# Patient Record
Sex: Female | Born: 1955
Health system: Southern US, Community
[De-identification: ages and names within clinical notes are randomized; demographics above are authoritative.]

## PROBLEM LIST (undated history)

## (undated) DIAGNOSIS — Z1211 Encounter for screening for malignant neoplasm of colon: Secondary | ICD-10-CM

## (undated) DIAGNOSIS — R109 Unspecified abdominal pain: Secondary | ICD-10-CM

## (undated) DIAGNOSIS — T4145XA Adverse effect of unspecified anesthetic, initial encounter: Secondary | ICD-10-CM

## (undated) DIAGNOSIS — K219 Gastro-esophageal reflux disease without esophagitis: Secondary | ICD-10-CM

## (undated) DIAGNOSIS — T7840XA Allergy, unspecified, initial encounter: Secondary | ICD-10-CM

## (undated) DIAGNOSIS — C50919 Malignant neoplasm of unspecified site of unspecified female breast: Secondary | ICD-10-CM

## (undated) DIAGNOSIS — F329 Major depressive disorder, single episode, unspecified: Secondary | ICD-10-CM

## (undated) DIAGNOSIS — M2550 Pain in unspecified joint: Secondary | ICD-10-CM

## (undated) DIAGNOSIS — Z9221 Personal history of antineoplastic chemotherapy: Secondary | ICD-10-CM

## (undated) DIAGNOSIS — R0600 Dyspnea, unspecified: Secondary | ICD-10-CM

## (undated) DIAGNOSIS — G510 Bell's palsy: Secondary | ICD-10-CM

## (undated) DIAGNOSIS — Z923 Personal history of irradiation: Secondary | ICD-10-CM

## (undated) DIAGNOSIS — I2510 Atherosclerotic heart disease of native coronary artery without angina pectoris: Secondary | ICD-10-CM

## (undated) DIAGNOSIS — F32A Depression, unspecified: Secondary | ICD-10-CM

## (undated) DIAGNOSIS — E785 Hyperlipidemia, unspecified: Secondary | ICD-10-CM

## (undated) DIAGNOSIS — R079 Chest pain, unspecified: Secondary | ICD-10-CM

## (undated) DIAGNOSIS — Z853 Personal history of malignant neoplasm of breast: Secondary | ICD-10-CM

## (undated) DIAGNOSIS — T8859XA Other complications of anesthesia, initial encounter: Secondary | ICD-10-CM

## (undated) DIAGNOSIS — K760 Fatty (change of) liver, not elsewhere classified: Secondary | ICD-10-CM

## (undated) DIAGNOSIS — H269 Unspecified cataract: Secondary | ICD-10-CM

## (undated) DIAGNOSIS — K8020 Calculus of gallbladder without cholecystitis without obstruction: Secondary | ICD-10-CM

## (undated) DIAGNOSIS — I10 Essential (primary) hypertension: Secondary | ICD-10-CM

## (undated) HISTORY — DX: Unspecified abdominal pain: R10.9

## (undated) HISTORY — DX: Personal history of malignant neoplasm of breast: Z85.3

## (undated) HISTORY — DX: Allergy, unspecified, initial encounter: T78.40XA

## (undated) HISTORY — DX: Chest pain, unspecified: R07.9

## (undated) HISTORY — DX: Pain in unspecified joint: M25.50

## (undated) HISTORY — DX: Bell's palsy: G51.0

## (undated) HISTORY — DX: Hyperlipidemia, unspecified: E78.5

## (undated) HISTORY — PX: TUBAL LIGATION: SHX77

## (undated) HISTORY — DX: Unspecified cataract: H26.9

## (undated) HISTORY — DX: Encounter for screening for malignant neoplasm of colon: Z12.11

## (undated) HISTORY — DX: Malignant neoplasm of unspecified site of unspecified female breast: C50.919

## (undated) HISTORY — DX: Essential (primary) hypertension: I10

## (undated) HISTORY — DX: Calculus of gallbladder without cholecystitis without obstruction: K80.20

## (undated) HISTORY — PX: OTHER SURGICAL HISTORY: SHX169

## (undated) HISTORY — DX: Gastro-esophageal reflux disease without esophagitis: K21.9

## (undated) HISTORY — DX: Fatty (change of) liver, not elsewhere classified: K76.0

---

## 2004-11-21 DIAGNOSIS — I639 Cerebral infarction, unspecified: Secondary | ICD-10-CM

## 2004-11-21 DIAGNOSIS — G510 Bell's palsy: Secondary | ICD-10-CM

## 2004-11-21 HISTORY — DX: Cerebral infarction, unspecified: I63.9

## 2004-11-21 HISTORY — DX: Bell's palsy: G51.0

## 2005-12-30 ENCOUNTER — Emergency Department (HOSPITAL_COMMUNITY): Admission: EM | Admit: 2005-12-30 | Discharge: 2005-12-30 | Payer: Self-pay | Admitting: Emergency Medicine

## 2006-01-04 ENCOUNTER — Emergency Department (HOSPITAL_COMMUNITY): Admission: EM | Admit: 2006-01-04 | Discharge: 2006-01-04 | Payer: Self-pay | Admitting: Family Medicine

## 2006-01-12 ENCOUNTER — Encounter: Admission: RE | Admit: 2006-01-12 | Discharge: 2006-04-12 | Payer: Self-pay | Admitting: Emergency Medicine

## 2006-03-07 ENCOUNTER — Ambulatory Visit: Payer: Self-pay | Admitting: *Deleted

## 2006-03-07 ENCOUNTER — Ambulatory Visit: Payer: Self-pay | Admitting: Family Medicine

## 2006-04-26 ENCOUNTER — Ambulatory Visit: Payer: Self-pay | Admitting: Family Medicine

## 2006-06-19 ENCOUNTER — Ambulatory Visit: Payer: Self-pay | Admitting: Family Medicine

## 2006-06-22 ENCOUNTER — Ambulatory Visit (HOSPITAL_COMMUNITY): Admission: RE | Admit: 2006-06-22 | Discharge: 2006-06-22 | Payer: Self-pay | Admitting: Internal Medicine

## 2006-07-08 ENCOUNTER — Ambulatory Visit (HOSPITAL_COMMUNITY): Admission: RE | Admit: 2006-07-08 | Discharge: 2006-07-08 | Payer: Self-pay | Admitting: Internal Medicine

## 2006-08-28 ENCOUNTER — Ambulatory Visit: Payer: Self-pay | Admitting: Family Medicine

## 2006-11-22 ENCOUNTER — Emergency Department (HOSPITAL_COMMUNITY): Admission: EM | Admit: 2006-11-22 | Discharge: 2006-11-22 | Payer: Self-pay | Admitting: Emergency Medicine

## 2006-12-20 ENCOUNTER — Ambulatory Visit: Payer: Self-pay | Admitting: Family Medicine

## 2007-01-10 ENCOUNTER — Ambulatory Visit: Payer: Self-pay | Admitting: Family Medicine

## 2007-06-26 ENCOUNTER — Ambulatory Visit: Payer: Self-pay | Admitting: Family Medicine

## 2007-07-19 ENCOUNTER — Ambulatory Visit: Payer: Self-pay | Admitting: Internal Medicine

## 2007-07-31 ENCOUNTER — Ambulatory Visit: Payer: Self-pay | Admitting: Family Medicine

## 2007-08-01 ENCOUNTER — Ambulatory Visit (HOSPITAL_COMMUNITY): Admission: RE | Admit: 2007-08-01 | Discharge: 2007-08-01 | Payer: Self-pay | Admitting: Family Medicine

## 2007-08-08 ENCOUNTER — Ambulatory Visit: Payer: Self-pay | Admitting: Internal Medicine

## 2007-08-08 ENCOUNTER — Encounter (INDEPENDENT_AMBULATORY_CARE_PROVIDER_SITE_OTHER): Payer: Self-pay | Admitting: *Deleted

## 2007-08-14 ENCOUNTER — Encounter: Payer: Self-pay | Admitting: Internal Medicine

## 2007-08-14 ENCOUNTER — Ambulatory Visit: Payer: Self-pay | Admitting: Internal Medicine

## 2007-08-21 ENCOUNTER — Ambulatory Visit: Payer: Self-pay | Admitting: Family Medicine

## 2007-08-27 ENCOUNTER — Ambulatory Visit (HOSPITAL_COMMUNITY): Admission: RE | Admit: 2007-08-27 | Discharge: 2007-08-27 | Payer: Self-pay | Admitting: Family Medicine

## 2007-09-11 ENCOUNTER — Ambulatory Visit: Payer: Self-pay | Admitting: Family Medicine

## 2007-10-26 ENCOUNTER — Other Ambulatory Visit: Admission: RE | Admit: 2007-10-26 | Discharge: 2007-10-26 | Payer: Self-pay | Admitting: Obstetrics & Gynecology

## 2007-10-26 ENCOUNTER — Ambulatory Visit: Payer: Self-pay | Admitting: Obstetrics & Gynecology

## 2007-10-26 ENCOUNTER — Ambulatory Visit: Payer: Self-pay | Admitting: Family Medicine

## 2007-11-13 ENCOUNTER — Ambulatory Visit: Payer: Self-pay | Admitting: Internal Medicine

## 2007-11-22 DIAGNOSIS — Z9221 Personal history of antineoplastic chemotherapy: Secondary | ICD-10-CM

## 2007-11-22 DIAGNOSIS — Z923 Personal history of irradiation: Secondary | ICD-10-CM

## 2007-11-22 DIAGNOSIS — C50919 Malignant neoplasm of unspecified site of unspecified female breast: Secondary | ICD-10-CM

## 2007-11-22 HISTORY — PX: BREAST LUMPECTOMY: SHX2

## 2007-11-22 HISTORY — DX: Personal history of antineoplastic chemotherapy: Z92.21

## 2007-11-22 HISTORY — DX: Personal history of irradiation: Z92.3

## 2007-11-22 HISTORY — DX: Malignant neoplasm of unspecified site of unspecified female breast: C50.919

## 2007-12-03 ENCOUNTER — Ambulatory Visit: Payer: Self-pay | Admitting: Family Medicine

## 2007-12-03 LAB — CONVERTED CEMR LAB
Basophils Absolute: 0 10*3/uL (ref 0.0–0.1)
Basophils Relative: 0 % (ref 0–1)
Eosinophils Absolute: 0.2 10*3/uL (ref 0.0–0.7)
Eosinophils Relative: 3 % (ref 0–5)
HCT: 42.9 % (ref 36.0–46.0)
Hemoglobin: 14.7 g/dL (ref 12.0–15.0)
MCHC: 34.3 g/dL (ref 30.0–36.0)
MCV: 85.3 fL (ref 78.0–100.0)
Monocytes Absolute: 0.5 10*3/uL (ref 0.1–1.0)
RDW: 13 % (ref 11.5–15.5)

## 2008-02-15 ENCOUNTER — Ambulatory Visit: Payer: Self-pay | Admitting: Family Medicine

## 2008-04-29 ENCOUNTER — Ambulatory Visit: Payer: Self-pay | Admitting: Internal Medicine

## 2008-06-03 ENCOUNTER — Ambulatory Visit: Payer: Self-pay | Admitting: Internal Medicine

## 2008-09-26 ENCOUNTER — Encounter (INDEPENDENT_AMBULATORY_CARE_PROVIDER_SITE_OTHER): Payer: Self-pay | Admitting: Family Medicine

## 2008-09-26 ENCOUNTER — Ambulatory Visit: Payer: Self-pay | Admitting: Family Medicine

## 2008-09-26 LAB — CONVERTED CEMR LAB
BUN: 7 mg/dL (ref 6–23)
CO2: 28 meq/L (ref 19–32)
Cholesterol: 155 mg/dL (ref 0–200)
Creatinine, Ser: 0.63 mg/dL (ref 0.40–1.20)
Glucose, Bld: 152 mg/dL — ABNORMAL HIGH (ref 70–99)
Sodium: 142 meq/L (ref 135–145)
Total Bilirubin: 0.7 mg/dL (ref 0.3–1.2)
Total Protein: 7.5 g/dL (ref 6.0–8.3)
Triglycerides: 199 mg/dL — ABNORMAL HIGH (ref <150)
VLDL: 40 mg/dL (ref 0–40)

## 2008-09-27 ENCOUNTER — Encounter (INDEPENDENT_AMBULATORY_CARE_PROVIDER_SITE_OTHER): Payer: Self-pay | Admitting: Family Medicine

## 2008-11-06 ENCOUNTER — Encounter: Admission: RE | Admit: 2008-11-06 | Discharge: 2008-11-06 | Payer: Self-pay | Admitting: Family Medicine

## 2008-11-20 ENCOUNTER — Emergency Department (HOSPITAL_COMMUNITY): Admission: EM | Admit: 2008-11-20 | Discharge: 2008-11-20 | Payer: Self-pay | Admitting: Emergency Medicine

## 2008-12-05 ENCOUNTER — Ambulatory Visit: Payer: Self-pay | Admitting: *Deleted

## 2008-12-11 ENCOUNTER — Ambulatory Visit: Payer: Self-pay | Admitting: Internal Medicine

## 2008-12-11 LAB — CONVERTED CEMR LAB
BUN: 10 mg/dL
CO2: 22 meq/L
Calcium: 9.7 mg/dL
Chloride: 102 meq/L
Creatinine, Ser: 0.6 mg/dL
Glucose, Bld: 225 mg/dL — ABNORMAL HIGH
Potassium: 3.9 meq/L
Sodium: 139 meq/L
Total CK: 23 U/L

## 2008-12-16 ENCOUNTER — Encounter (INDEPENDENT_AMBULATORY_CARE_PROVIDER_SITE_OTHER): Payer: Self-pay | Admitting: Diagnostic Radiology

## 2008-12-16 ENCOUNTER — Encounter: Admission: RE | Admit: 2008-12-16 | Discharge: 2008-12-16 | Payer: Self-pay | Admitting: Family Medicine

## 2008-12-19 ENCOUNTER — Ambulatory Visit: Payer: Self-pay | Admitting: Oncology

## 2008-12-24 ENCOUNTER — Encounter: Admission: RE | Admit: 2008-12-24 | Discharge: 2008-12-24 | Payer: Self-pay | Admitting: Family Medicine

## 2008-12-25 LAB — CBC WITH DIFFERENTIAL/PLATELET
BASO%: 0.4 % (ref 0.0–2.0)
LYMPH%: 29.5 % (ref 14.0–48.0)
MCHC: 34.5 g/dL (ref 32.0–36.0)
MONO#: 0.6 10*3/uL (ref 0.1–0.9)
Platelets: 275 10*3/uL (ref 145–400)
RBC: 4.52 10*6/uL (ref 3.70–5.32)
WBC: 8 10*3/uL (ref 3.9–10.0)
lymph#: 2.4 10*3/uL (ref 0.9–3.3)

## 2008-12-25 LAB — COMPREHENSIVE METABOLIC PANEL
AST: 27 U/L (ref 0–37)
Albumin: 4.2 g/dL (ref 3.5–5.2)
Alkaline Phosphatase: 114 U/L (ref 39–117)
Glucose, Bld: 218 mg/dL — ABNORMAL HIGH (ref 70–99)
Potassium: 4 mEq/L (ref 3.5–5.3)
Sodium: 136 mEq/L (ref 135–145)
Total Protein: 7.6 g/dL (ref 6.0–8.3)

## 2009-01-01 ENCOUNTER — Ambulatory Visit (HOSPITAL_COMMUNITY): Admission: RE | Admit: 2009-01-01 | Discharge: 2009-01-01 | Payer: Self-pay | Admitting: General Surgery

## 2009-01-11 ENCOUNTER — Inpatient Hospital Stay (HOSPITAL_COMMUNITY): Admission: EM | Admit: 2009-01-11 | Discharge: 2009-01-19 | Payer: Self-pay | Admitting: Emergency Medicine

## 2009-01-12 ENCOUNTER — Ambulatory Visit: Payer: Self-pay | Admitting: Oncology

## 2009-01-26 LAB — CBC WITH DIFFERENTIAL/PLATELET
BASO%: 0.2 % (ref 0.0–2.0)
Basophils Absolute: 0 10*3/uL (ref 0.0–0.1)
EOS%: 0 % (ref 0.0–7.0)
HGB: 11.5 g/dL — ABNORMAL LOW (ref 11.6–15.9)
MCH: 28.5 pg (ref 25.1–34.0)
MCHC: 34.1 g/dL (ref 31.5–36.0)
MCV: 83.4 fL (ref 79.5–101.0)
MONO%: 2.3 % (ref 0.0–14.0)
RBC: 4.04 10*6/uL (ref 3.70–5.45)
RDW: 15.2 % — ABNORMAL HIGH (ref 11.2–14.5)
lymph#: 0.6 10*3/uL — ABNORMAL LOW (ref 0.9–3.3)
nRBC: 0 % (ref 0–0)

## 2009-01-26 LAB — COMPREHENSIVE METABOLIC PANEL
AST: 37 U/L (ref 0–37)
BUN: 17 mg/dL (ref 6–23)
Calcium: 8.6 mg/dL (ref 8.4–10.5)
Chloride: 102 mEq/L (ref 96–112)
Creatinine, Ser: 0.77 mg/dL (ref 0.40–1.20)
Total Bilirubin: 0.3 mg/dL (ref 0.3–1.2)

## 2009-01-26 LAB — WHOLE BLOOD GLUCOSE: Glucose: 569 mg/dL (ref 70–100)

## 2009-01-27 ENCOUNTER — Ambulatory Visit: Payer: Self-pay | Admitting: Family Medicine

## 2009-02-04 ENCOUNTER — Ambulatory Visit: Payer: Self-pay | Admitting: Oncology

## 2009-02-16 LAB — COMPREHENSIVE METABOLIC PANEL
ALT: 19 U/L (ref 0–35)
Albumin: 3.7 g/dL (ref 3.5–5.2)
CO2: 25 mEq/L (ref 19–32)
Calcium: 9.1 mg/dL (ref 8.4–10.5)
Chloride: 103 mEq/L (ref 96–112)
Potassium: 3.4 mEq/L — ABNORMAL LOW (ref 3.5–5.3)
Sodium: 138 mEq/L (ref 135–145)
Total Protein: 6.4 g/dL (ref 6.0–8.3)

## 2009-02-16 LAB — CBC WITH DIFFERENTIAL/PLATELET
Eosinophils Absolute: 0.2 10*3/uL (ref 0.0–0.5)
HGB: 11.5 g/dL — ABNORMAL LOW (ref 11.6–15.9)
MONO#: 0.9 10*3/uL (ref 0.1–0.9)
NEUT#: 4.5 10*3/uL (ref 1.5–6.5)
Platelets: 243 10*3/uL (ref 145–400)
RBC: 3.99 10*6/uL (ref 3.70–5.45)
RDW: 15.7 % — ABNORMAL HIGH (ref 11.2–14.5)
WBC: 7.1 10*3/uL (ref 3.9–10.3)
nRBC: 0 % (ref 0–0)

## 2009-03-09 LAB — CBC WITH DIFFERENTIAL/PLATELET
Basophils Absolute: 0.1 10*3/uL (ref 0.0–0.1)
EOS%: 2.2 % (ref 0.0–7.0)
Eosinophils Absolute: 0.1 10*3/uL (ref 0.0–0.5)
HGB: 11 g/dL — ABNORMAL LOW (ref 11.6–15.9)
MCH: 29.2 pg (ref 25.1–34.0)
NEUT#: 3.8 10*3/uL (ref 1.5–6.5)
RBC: 3.77 10*6/uL (ref 3.70–5.45)
RDW: 15.9 % — ABNORMAL HIGH (ref 11.2–14.5)
lymph#: 1.5 10*3/uL (ref 0.9–3.3)

## 2009-03-09 LAB — COMPREHENSIVE METABOLIC PANEL
AST: 21 U/L (ref 0–37)
Albumin: 3.5 g/dL (ref 3.5–5.2)
BUN: 7 mg/dL (ref 6–23)
Calcium: 9 mg/dL (ref 8.4–10.5)
Chloride: 104 mEq/L (ref 96–112)
Potassium: 3.5 mEq/L (ref 3.5–5.3)
Sodium: 140 mEq/L (ref 135–145)
Total Protein: 6.3 g/dL (ref 6.0–8.3)

## 2009-03-26 ENCOUNTER — Ambulatory Visit: Payer: Self-pay | Admitting: Oncology

## 2009-03-30 ENCOUNTER — Ambulatory Visit (HOSPITAL_COMMUNITY): Admission: RE | Admit: 2009-03-30 | Discharge: 2009-03-30 | Payer: Self-pay | Admitting: Oncology

## 2009-03-30 LAB — CBC WITH DIFFERENTIAL/PLATELET
Basophils Absolute: 0 10*3/uL (ref 0.0–0.1)
Eosinophils Absolute: 0 10*3/uL (ref 0.0–0.5)
HGB: 11 g/dL — ABNORMAL LOW (ref 11.6–15.9)
MCV: 89.2 fL (ref 79.5–101.0)
MONO#: 0.8 10*3/uL (ref 0.1–0.9)
NEUT#: 4.5 10*3/uL (ref 1.5–6.5)
RDW: 15.8 % — ABNORMAL HIGH (ref 11.2–14.5)
lymph#: 1.6 10*3/uL (ref 0.9–3.3)

## 2009-03-30 LAB — COMPREHENSIVE METABOLIC PANEL
Albumin: 3.6 g/dL (ref 3.5–5.2)
BUN: 9 mg/dL (ref 6–23)
CO2: 24 mEq/L (ref 19–32)
Calcium: 9.2 mg/dL (ref 8.4–10.5)
Chloride: 104 mEq/L (ref 96–112)
Glucose, Bld: 174 mg/dL — ABNORMAL HIGH (ref 70–99)
Potassium: 4 mEq/L (ref 3.5–5.3)

## 2009-04-02 ENCOUNTER — Ambulatory Visit (HOSPITAL_COMMUNITY): Admission: RE | Admit: 2009-04-02 | Discharge: 2009-04-02 | Payer: Self-pay | Admitting: Oncology

## 2009-04-21 ENCOUNTER — Ambulatory Visit (HOSPITAL_BASED_OUTPATIENT_CLINIC_OR_DEPARTMENT_OTHER): Admission: RE | Admit: 2009-04-21 | Discharge: 2009-04-21 | Payer: Self-pay | Admitting: General Surgery

## 2009-04-21 ENCOUNTER — Encounter (INDEPENDENT_AMBULATORY_CARE_PROVIDER_SITE_OTHER): Payer: Self-pay | Admitting: General Surgery

## 2009-04-21 ENCOUNTER — Encounter: Admission: RE | Admit: 2009-04-21 | Discharge: 2009-04-21 | Payer: Self-pay | Admitting: General Surgery

## 2009-04-30 LAB — CBC WITH DIFFERENTIAL/PLATELET
BASO%: 0.4 % (ref 0.0–2.0)
Basophils Absolute: 0 10*3/uL (ref 0.0–0.1)
EOS%: 5.4 % (ref 0.0–7.0)
Eosinophils Absolute: 0.4 10*3/uL (ref 0.0–0.5)
HCT: 35.8 % (ref 34.8–46.6)
HGB: 12.1 g/dL (ref 11.6–15.9)
LYMPH%: 27.8 % (ref 14.0–49.7)
MCH: 28.6 pg (ref 25.1–34.0)
MCHC: 33.8 g/dL (ref 31.5–36.0)
MCV: 84.6 fL (ref 79.5–101.0)
MONO#: 0.8 10*3/uL (ref 0.1–0.9)
MONO%: 11.9 % (ref 0.0–14.0)
NEUT#: 3.8 10*3/uL (ref 1.5–6.5)
NEUT%: 54.5 % (ref 38.4–76.8)
Platelets: 235 10*3/uL (ref 145–400)
RBC: 4.23 10*6/uL (ref 3.70–5.45)
RDW: 13.2 % (ref 11.2–14.5)
WBC: 6.9 10*3/uL (ref 3.9–10.3)
lymph#: 1.9 10*3/uL (ref 0.9–3.3)

## 2009-04-30 LAB — COMPREHENSIVE METABOLIC PANEL
Albumin: 3.7 g/dL (ref 3.5–5.2)
Alkaline Phosphatase: 82 U/L (ref 39–117)
BUN: 8 mg/dL (ref 6–23)
Calcium: 9.3 mg/dL (ref 8.4–10.5)
Glucose, Bld: 173 mg/dL — ABNORMAL HIGH (ref 70–99)
Potassium: 4.1 mEq/L (ref 3.5–5.3)

## 2009-05-01 ENCOUNTER — Ambulatory Visit: Admission: RE | Admit: 2009-05-01 | Discharge: 2009-07-08 | Payer: Self-pay | Admitting: Radiation Oncology

## 2009-05-27 ENCOUNTER — Ambulatory Visit (HOSPITAL_COMMUNITY): Admission: RE | Admit: 2009-05-27 | Discharge: 2009-05-27 | Payer: Self-pay | Admitting: General Surgery

## 2009-06-12 ENCOUNTER — Ambulatory Visit (HOSPITAL_COMMUNITY): Admission: RE | Admit: 2009-06-12 | Discharge: 2009-06-12 | Payer: Self-pay | Admitting: Radiation Oncology

## 2009-06-29 ENCOUNTER — Ambulatory Visit: Payer: Self-pay | Admitting: Oncology

## 2009-07-01 LAB — COMPREHENSIVE METABOLIC PANEL
Alkaline Phosphatase: 95 U/L (ref 39–117)
CO2: 26 mEq/L (ref 19–32)
Creatinine, Ser: 0.68 mg/dL (ref 0.40–1.20)
Glucose, Bld: 165 mg/dL — ABNORMAL HIGH (ref 70–99)
Total Bilirubin: 0.4 mg/dL (ref 0.3–1.2)

## 2009-07-01 LAB — CBC WITH DIFFERENTIAL/PLATELET
Eosinophils Absolute: 0.2 10*3/uL (ref 0.0–0.5)
HCT: 38.8 % (ref 34.8–46.6)
LYMPH%: 21.7 % (ref 14.0–49.7)
MCHC: 34.5 g/dL (ref 31.5–36.0)
MCV: 81.8 fL (ref 79.5–101.0)
MONO#: 0.6 10*3/uL (ref 0.1–0.9)
MONO%: 12.3 % (ref 0.0–14.0)
NEUT#: 2.9 10*3/uL (ref 1.5–6.5)
NEUT%: 61 % (ref 38.4–76.8)
Platelets: 174 10*3/uL (ref 145–400)
RBC: 4.75 10*6/uL (ref 3.70–5.45)
WBC: 4.8 10*3/uL (ref 3.9–10.3)

## 2009-08-20 ENCOUNTER — Ambulatory Visit: Payer: Self-pay | Admitting: Family Medicine

## 2009-09-29 ENCOUNTER — Ambulatory Visit: Payer: Self-pay | Admitting: Oncology

## 2009-10-02 LAB — CBC WITH DIFFERENTIAL/PLATELET
BASO%: 0.4 % (ref 0.0–2.0)
EOS%: 3.9 % (ref 0.0–7.0)
HCT: 36.9 % (ref 34.8–46.6)
LYMPH%: 29.1 % (ref 14.0–49.7)
MCH: 30.6 pg (ref 25.1–34.0)
MCHC: 34.6 g/dL (ref 31.5–36.0)
MCV: 88.6 fL (ref 79.5–101.0)
MONO%: 8.6 % (ref 0.0–14.0)
NEUT%: 58 % (ref 38.4–76.8)
Platelets: 152 10*3/uL (ref 145–400)
RBC: 4.17 10*6/uL (ref 3.70–5.45)
WBC: 4.6 10*3/uL (ref 3.9–10.3)

## 2009-10-02 LAB — COMPREHENSIVE METABOLIC PANEL
ALT: 22 U/L (ref 0–35)
Alkaline Phosphatase: 77 U/L (ref 39–117)
CO2: 25 mEq/L (ref 19–32)
Creatinine, Ser: 0.76 mg/dL (ref 0.40–1.20)
Sodium: 139 mEq/L (ref 135–145)
Total Bilirubin: 0.6 mg/dL (ref 0.3–1.2)
Total Protein: 6.6 g/dL (ref 6.0–8.3)

## 2009-11-09 ENCOUNTER — Encounter: Admission: RE | Admit: 2009-11-09 | Discharge: 2009-11-09 | Payer: Self-pay | Admitting: Oncology

## 2010-01-05 ENCOUNTER — Ambulatory Visit: Payer: Self-pay | Admitting: Oncology

## 2010-01-07 ENCOUNTER — Encounter: Admission: RE | Admit: 2010-01-07 | Discharge: 2010-01-07 | Payer: Self-pay | Admitting: General Surgery

## 2010-01-07 ENCOUNTER — Ambulatory Visit (HOSPITAL_COMMUNITY): Admission: RE | Admit: 2010-01-07 | Discharge: 2010-01-07 | Payer: Self-pay | Admitting: Oncology

## 2010-01-07 LAB — COMPREHENSIVE METABOLIC PANEL
AST: 50 U/L — ABNORMAL HIGH (ref 0–37)
Albumin: 4.1 g/dL (ref 3.5–5.2)
BUN: 9 mg/dL (ref 6–23)
CO2: 21 mEq/L (ref 19–32)
Calcium: 8.5 mg/dL (ref 8.4–10.5)
Chloride: 106 mEq/L (ref 96–112)
Creatinine, Ser: 0.73 mg/dL (ref 0.40–1.20)
Glucose, Bld: 229 mg/dL — ABNORMAL HIGH (ref 70–99)
Potassium: 3.7 mEq/L (ref 3.5–5.3)

## 2010-01-07 LAB — CBC WITH DIFFERENTIAL/PLATELET
Basophils Absolute: 0 10*3/uL (ref 0.0–0.1)
Eosinophils Absolute: 0.3 10*3/uL (ref 0.0–0.5)
HCT: 38.8 % (ref 34.8–46.6)
HGB: 13.4 g/dL (ref 11.6–15.9)
MONO#: 0.5 10*3/uL (ref 0.1–0.9)
NEUT#: 3.1 10*3/uL (ref 1.5–6.5)
NEUT%: 55.6 % (ref 38.4–76.8)
RDW: 12 % (ref 11.2–14.5)
lymph#: 1.7 10*3/uL (ref 0.9–3.3)

## 2010-04-06 ENCOUNTER — Ambulatory Visit: Payer: Self-pay | Admitting: Oncology

## 2010-04-07 LAB — CBC WITH DIFFERENTIAL/PLATELET
Basophils Absolute: 0 10*3/uL (ref 0.0–0.1)
EOS%: 4.6 % (ref 0.0–7.0)
Eosinophils Absolute: 0.2 10*3/uL (ref 0.0–0.5)
HCT: 40.7 % (ref 34.8–46.6)
HGB: 14.3 g/dL (ref 11.6–15.9)
MCH: 31.2 pg (ref 25.1–34.0)
MCV: 88.8 fL (ref 79.5–101.0)
MONO%: 8.1 % (ref 0.0–14.0)
NEUT#: 2.9 10*3/uL (ref 1.5–6.5)
NEUT%: 54.5 % (ref 38.4–76.8)
RDW: 12.3 % (ref 11.2–14.5)
lymph#: 1.7 10*3/uL (ref 0.9–3.3)

## 2010-04-07 LAB — COMPREHENSIVE METABOLIC PANEL
AST: 45 U/L — ABNORMAL HIGH (ref 0–37)
Albumin: 3.8 g/dL (ref 3.5–5.2)
BUN: 9 mg/dL (ref 6–23)
Calcium: 8.8 mg/dL (ref 8.4–10.5)
Chloride: 104 mEq/L (ref 96–112)
Creatinine, Ser: 0.61 mg/dL (ref 0.40–1.20)
Glucose, Bld: 174 mg/dL — ABNORMAL HIGH (ref 70–99)
Potassium: 3.7 mEq/L (ref 3.5–5.3)

## 2010-07-07 ENCOUNTER — Ambulatory Visit: Payer: Self-pay | Admitting: Oncology

## 2010-07-08 LAB — CBC WITH DIFFERENTIAL/PLATELET
Basophils Absolute: 0 10*3/uL (ref 0.0–0.1)
EOS%: 5 % (ref 0.0–7.0)
Eosinophils Absolute: 0.2 10*3/uL (ref 0.0–0.5)
HCT: 35.2 % (ref 34.8–46.6)
HGB: 12.3 g/dL (ref 11.6–15.9)
LYMPH%: 23.9 % (ref 14.0–49.7)
MCH: 31.4 pg (ref 25.1–34.0)
MCV: 90.1 fL (ref 79.5–101.0)
MONO%: 8.7 % (ref 0.0–14.0)
NEUT#: 3 10*3/uL (ref 1.5–6.5)
NEUT%: 62 % (ref 38.4–76.8)
Platelets: 160 10*3/uL (ref 145–400)
RDW: 12.6 % (ref 11.2–14.5)

## 2010-07-08 LAB — COMPREHENSIVE METABOLIC PANEL
AST: 56 U/L — ABNORMAL HIGH (ref 0–37)
Albumin: 3.7 g/dL (ref 3.5–5.2)
Alkaline Phosphatase: 93 U/L (ref 39–117)
BUN: 9 mg/dL (ref 6–23)
Creatinine, Ser: 0.68 mg/dL (ref 0.40–1.20)
Glucose, Bld: 287 mg/dL — ABNORMAL HIGH (ref 70–99)
Total Bilirubin: 0.8 mg/dL (ref 0.3–1.2)

## 2010-08-16 ENCOUNTER — Encounter: Admission: RE | Admit: 2010-08-16 | Discharge: 2010-08-16 | Payer: Self-pay | Admitting: Oncology

## 2010-08-31 ENCOUNTER — Ambulatory Visit (HOSPITAL_COMMUNITY): Admission: RE | Admit: 2010-08-31 | Discharge: 2010-08-31 | Payer: Self-pay | Admitting: Oncology

## 2010-08-31 ENCOUNTER — Ambulatory Visit (HOSPITAL_BASED_OUTPATIENT_CLINIC_OR_DEPARTMENT_OTHER): Payer: Self-pay | Admitting: Oncology

## 2010-08-31 LAB — CBC WITH DIFFERENTIAL/PLATELET
Basophils Absolute: 0 10*3/uL (ref 0.0–0.1)
Eosinophils Absolute: 0.2 10*3/uL (ref 0.0–0.5)
HCT: 34.4 % — ABNORMAL LOW (ref 34.8–46.6)
HGB: 12 g/dL (ref 11.6–15.9)
MCH: 31.5 pg (ref 25.1–34.0)
MONO#: 0.4 10*3/uL (ref 0.1–0.9)
NEUT%: 59.2 % (ref 38.4–76.8)
WBC: 4.6 10*3/uL (ref 3.9–10.3)
lymph#: 1.3 10*3/uL (ref 0.9–3.3)

## 2010-08-31 LAB — COMPREHENSIVE METABOLIC PANEL
BUN: 14 mg/dL (ref 6–23)
CO2: 28 mEq/L (ref 19–32)
Calcium: 9 mg/dL (ref 8.4–10.5)
Chloride: 106 mEq/L (ref 96–112)
Creatinine, Ser: 0.79 mg/dL (ref 0.40–1.20)

## 2010-08-31 LAB — LACTATE DEHYDROGENASE: LDH: 102 U/L (ref 94–250)

## 2010-12-12 ENCOUNTER — Encounter: Payer: Self-pay | Admitting: Internal Medicine

## 2010-12-12 ENCOUNTER — Encounter: Payer: Self-pay | Admitting: Family Medicine

## 2010-12-12 ENCOUNTER — Encounter: Payer: Self-pay | Admitting: Oncology

## 2010-12-13 ENCOUNTER — Encounter: Payer: Self-pay | Admitting: Oncology

## 2010-12-23 ENCOUNTER — Encounter (HOSPITAL_BASED_OUTPATIENT_CLINIC_OR_DEPARTMENT_OTHER): Payer: Self-pay | Admitting: Oncology

## 2010-12-23 DIAGNOSIS — Z170 Estrogen receptor positive status [ER+]: Secondary | ICD-10-CM

## 2010-12-23 DIAGNOSIS — C50219 Malignant neoplasm of upper-inner quadrant of unspecified female breast: Secondary | ICD-10-CM

## 2010-12-23 LAB — CBC WITH DIFFERENTIAL/PLATELET
Basophils Absolute: 0 10*3/uL (ref 0.0–0.1)
EOS%: 3 % (ref 0.0–7.0)
Eosinophils Absolute: 0.2 10*3/uL (ref 0.0–0.5)
HGB: 11.8 g/dL (ref 11.6–15.9)
MCH: 31.4 pg (ref 25.1–34.0)
NEUT#: 3.2 10*3/uL (ref 1.5–6.5)
RBC: 3.77 10*6/uL (ref 3.70–5.45)
RDW: 12.1 % (ref 11.2–14.5)
lymph#: 1.5 10*3/uL (ref 0.9–3.3)

## 2010-12-23 LAB — LUTEINIZING HORMONE: LH: 9.8 m[IU]/mL

## 2010-12-23 LAB — COMPREHENSIVE METABOLIC PANEL
AST: 35 U/L (ref 0–37)
Albumin: 4.1 g/dL (ref 3.5–5.2)
BUN: 14 mg/dL (ref 6–23)
Calcium: 8.9 mg/dL (ref 8.4–10.5)
Chloride: 103 mEq/L (ref 96–112)
Potassium: 3.8 mEq/L (ref 3.5–5.3)
Sodium: 141 mEq/L (ref 135–145)
Total Protein: 6.8 g/dL (ref 6.0–8.3)

## 2010-12-30 LAB — ESTRADIOL, ULTRA SENS: Estradiol, Ultra Sensitive: 10 pg/mL

## 2011-02-27 LAB — GLUCOSE, CAPILLARY
Glucose-Capillary: 183 mg/dL — ABNORMAL HIGH (ref 70–99)
Glucose-Capillary: 185 mg/dL — ABNORMAL HIGH (ref 70–99)

## 2011-02-27 LAB — BASIC METABOLIC PANEL
BUN: 8 mg/dL (ref 6–23)
GFR calc Af Amer: >60 mL/min (ref >60)
GFR calc non Af Amer: >60 mL/min (ref >60)
Potassium: 3.9 mEq/L (ref 3.5–5.1)
Sodium: 140 mEq/L (ref 135–145)

## 2011-02-27 LAB — CBC
HCT: 39.6 % (ref 36.0–46.0)
Platelets: 205 10*3/uL (ref 150–400)
RBC: 4.7 MIL/uL (ref 3.87–5.11)
WBC: 5.2 10*3/uL (ref 4.0–10.5)

## 2011-02-28 LAB — GLUCOSE, CAPILLARY: Glucose-Capillary: 130 mg/dL — ABNORMAL HIGH (ref 70–99)

## 2011-03-01 LAB — CBC
MCHC: 33.3 g/dL (ref 30.0–36.0)
MCV: 88 fL (ref 78.0–100.0)
RBC: 3.96 MIL/uL (ref 3.87–5.11)

## 2011-03-01 LAB — DIFFERENTIAL
Lymphocytes Relative: 31 % (ref 12–46)
Lymphs Abs: 1.5 10*3/uL (ref 0.7–4.0)
Neutrophils Relative %: 52 % (ref 43–77)

## 2011-03-01 LAB — URINALYSIS, ROUTINE W REFLEX MICROSCOPIC
Nitrite: NEGATIVE
Specific Gravity, Urine: 1.009 (ref 1.005–1.030)
pH: 5.5 (ref 5.0–8.0)

## 2011-03-01 LAB — LACTATE DEHYDROGENASE: LDH: 153 U/L (ref 94–250)

## 2011-03-01 LAB — URINE MICROSCOPIC-ADD ON

## 2011-03-01 LAB — COMPREHENSIVE METABOLIC PANEL
AST: 37 U/L (ref 0–37)
CO2: 25 mEq/L (ref 19–32)
Calcium: 9 mg/dL (ref 8.4–10.5)
Creatinine, Ser: 0.63 mg/dL (ref 0.4–1.2)
GFR calc Af Amer: >60 mL/min (ref >60)
GFR calc non Af Amer: >60 mL/min (ref >60)

## 2011-03-03 LAB — GLUCOSE, CAPILLARY: Glucose-Capillary: 149 mg/dL — ABNORMAL HIGH (ref 70–99)

## 2011-03-03 LAB — BASIC METABOLIC PANEL
BUN: 3 mg/dL — ABNORMAL LOW (ref 6–23)
BUN: 3 mg/dL — ABNORMAL LOW (ref 6–23)
Calcium: 8.5 mg/dL (ref 8.4–10.5)
Chloride: 104 mEq/L (ref 96–112)
Creatinine, Ser: 0.46 mg/dL (ref 0.4–1.2)
GFR calc non Af Amer: >60 mL/min (ref >60)
GFR calc non Af Amer: >60 mL/min (ref >60)
Glucose, Bld: 135 mg/dL — ABNORMAL HIGH (ref 70–99)
Glucose, Bld: 138 mg/dL — ABNORMAL HIGH (ref 70–99)

## 2011-03-03 LAB — CBC
MCV: 87.4 fL (ref 78.0–100.0)
Platelets: 170 10*3/uL (ref 150–400)
RDW: 14.1 % (ref 11.5–15.5)
WBC: 8.8 10*3/uL (ref 4.0–10.5)

## 2011-03-08 LAB — CBC
HCT: 31.3 % — ABNORMAL LOW (ref 36.0–46.0)
HCT: 31.8 % — ABNORMAL LOW (ref 36.0–46.0)
HCT: 36.8 % (ref 36.0–46.0)
MCHC: 34.7 g/dL (ref 30.0–36.0)
MCHC: 33.4 g/dL (ref 30.0–36.0)
MCHC: 33.8 g/dL (ref 30.0–36.0)
MCHC: 34.2 g/dL (ref 30.0–36.0)
MCV: 85.1 fL (ref 78.0–100.0)
MCV: 85.9 fL (ref 78.0–100.0)
MCV: 86.1 fL (ref 78.0–100.0)
MCV: 86.7 fL (ref 78.0–100.0)
MCV: 87.4 fL (ref 78.0–100.0)
MCV: 87.7 fL (ref 78.0–100.0)
Platelets: 213 10*3/uL (ref 150–400)
Platelets: 145 10*3/uL — ABNORMAL LOW (ref 150–400)
Platelets: 145 10*3/uL — ABNORMAL LOW (ref 150–400)
Platelets: 157 10*3/uL (ref 150–400)
Platelets: 164 10*3/uL (ref 150–400)
Platelets: 170 10*3/uL (ref 150–400)
RBC: 4.39 MIL/uL (ref 3.87–5.11)
RBC: 3.45 MIL/uL — ABNORMAL LOW (ref 3.87–5.11)
RDW: 13 % (ref 11.5–15.5)
RDW: 13.3 % (ref 11.5–15.5)
RDW: 13.9 % (ref 11.5–15.5)
WBC: 19.5 10*3/uL — ABNORMAL HIGH (ref 4.0–10.5)
WBC: 21.4 10*3/uL — ABNORMAL HIGH (ref 4.0–10.5)
WBC: 9 10*3/uL (ref 4.0–10.5)

## 2011-03-08 LAB — GLUCOSE, CAPILLARY
Glucose-Capillary: 143 mg/dL — ABNORMAL HIGH (ref 70–99)
Glucose-Capillary: 122 mg/dL — ABNORMAL HIGH (ref 70–99)
Glucose-Capillary: 124 mg/dL — ABNORMAL HIGH (ref 70–99)
Glucose-Capillary: 127 mg/dL — ABNORMAL HIGH (ref 70–99)
Glucose-Capillary: 128 mg/dL — ABNORMAL HIGH (ref 70–99)
Glucose-Capillary: 130 mg/dL — ABNORMAL HIGH (ref 70–99)
Glucose-Capillary: 135 mg/dL — ABNORMAL HIGH (ref 70–99)
Glucose-Capillary: 143 mg/dL — ABNORMAL HIGH (ref 70–99)
Glucose-Capillary: 152 mg/dL — ABNORMAL HIGH (ref 70–99)
Glucose-Capillary: 158 mg/dL — ABNORMAL HIGH (ref 70–99)
Glucose-Capillary: 158 mg/dL — ABNORMAL HIGH (ref 70–99)
Glucose-Capillary: 159 mg/dL — ABNORMAL HIGH (ref 70–99)
Glucose-Capillary: 160 mg/dL — ABNORMAL HIGH (ref 70–99)
Glucose-Capillary: 163 mg/dL — ABNORMAL HIGH (ref 70–99)
Glucose-Capillary: 163 mg/dL — ABNORMAL HIGH (ref 70–99)
Glucose-Capillary: 204 mg/dL — ABNORMAL HIGH (ref 70–99)
Glucose-Capillary: 228 mg/dL — ABNORMAL HIGH (ref 70–99)
Glucose-Capillary: 90 mg/dL (ref 70–99)
Glucose-Capillary: 95 mg/dL (ref 70–99)

## 2011-03-08 LAB — BASIC METABOLIC PANEL
BUN: 10 mg/dL (ref 6–23)
BUN: 1 mg/dL — ABNORMAL LOW (ref 6–23)
BUN: 2 mg/dL — ABNORMAL LOW (ref 6–23)
BUN: 5 mg/dL — ABNORMAL LOW (ref 6–23)
CO2: 27 mEq/L (ref 19–32)
Calcium: 9 mg/dL (ref 8.4–10.5)
Calcium: 8.3 mg/dL — ABNORMAL LOW (ref 8.4–10.5)
Calcium: 8.4 mg/dL (ref 8.4–10.5)
Chloride: 108 mEq/L (ref 96–112)
Creatinine, Ser: 0.63 mg/dL (ref 0.4–1.2)
Creatinine, Ser: 0.57 mg/dL (ref 0.4–1.2)
Creatinine, Ser: 0.57 mg/dL (ref 0.4–1.2)
GFR calc Af Amer: >60 mL/min (ref >60)
GFR calc Af Amer: >60 mL/min (ref >60)
GFR calc non Af Amer: >60 mL/min (ref >60)
GFR calc non Af Amer: >60 mL/min (ref >60)
Glucose, Bld: 160 mg/dL — ABNORMAL HIGH (ref 70–99)
Potassium: 3.7 mEq/L (ref 3.5–5.1)

## 2011-03-08 LAB — COMPREHENSIVE METABOLIC PANEL
ALT: 22 U/L (ref 0–35)
AST: 23 U/L (ref 0–37)
AST: 26 U/L (ref 0–37)
Albumin: 2.6 g/dL — ABNORMAL LOW (ref 3.5–5.2)
Albumin: 3.4 g/dL — ABNORMAL LOW (ref 3.5–5.2)
BUN: 13 mg/dL (ref 6–23)
BUN: 2 mg/dL — ABNORMAL LOW (ref 6–23)
CO2: 27 mEq/L (ref 19–32)
Calcium: 8 mg/dL — ABNORMAL LOW (ref 8.4–10.5)
Calcium: 8.4 mg/dL (ref 8.4–10.5)
Calcium: 8.9 mg/dL (ref 8.4–10.5)
Chloride: 100 mEq/L (ref 96–112)
Chloride: 104 mEq/L (ref 96–112)
Creatinine, Ser: 0.56 mg/dL (ref 0.4–1.2)
Creatinine, Ser: 0.72 mg/dL (ref 0.4–1.2)
Creatinine, Ser: 0.82 mg/dL (ref 0.4–1.2)
GFR calc Af Amer: >60 mL/min (ref >60)
GFR calc Af Amer: >60 mL/min (ref >60)
GFR calc non Af Amer: >60 mL/min (ref >60)
GFR calc non Af Amer: >60 mL/min (ref >60)
GFR calc non Af Amer: >60 mL/min (ref >60)
Sodium: 139 mEq/L (ref 135–145)
Total Bilirubin: 0.4 mg/dL (ref 0.3–1.2)
Total Bilirubin: 1.2 mg/dL (ref 0.3–1.2)
Total Protein: 5.8 g/dL — ABNORMAL LOW (ref 6.0–8.3)

## 2011-03-08 LAB — LACTIC ACID, PLASMA: Lactic Acid, Venous: 1.6 mmol/L (ref 0.5–2.2)

## 2011-03-08 LAB — DIFFERENTIAL
Basophils Absolute: 0 10*3/uL (ref 0.0–0.1)
Basophils Relative: 0 % (ref 0–1)
Eosinophils Relative: 1 % (ref 0–5)
Eosinophils Relative: 9 % — ABNORMAL HIGH (ref 0–5)
Lymphocytes Relative: 16 % (ref 12–46)
Lymphocytes Relative: 48 % — ABNORMAL HIGH (ref 12–46)
Lymphs Abs: 1.4 10*3/uL (ref 0.7–4.0)
Lymphs Abs: 2 10*3/uL (ref 0.7–4.0)
Monocytes Relative: 10 % (ref 3–12)
Myelocytes: 0 %
Neutro Abs: 0.7 10*3/uL — ABNORMAL LOW (ref 1.7–7.7)
Neutro Abs: 6.5 10*3/uL (ref 1.7–7.7)
Neutrophils Relative %: 16 % — ABNORMAL LOW (ref 43–77)
Promyelocytes Absolute: 0 %
nRBC: 0 /100 WBC

## 2011-03-08 LAB — URINALYSIS, ROUTINE W REFLEX MICROSCOPIC
Bilirubin Urine: NEGATIVE
Glucose, UA: NEGATIVE mg/dL
Hgb urine dipstick: NEGATIVE
Protein, ur: NEGATIVE mg/dL
Urobilinogen, UA: 0.2 mg/dL (ref 0.0–1.0)

## 2011-03-08 LAB — LIPASE, BLOOD: Lipase: 15 U/L (ref 11–59)

## 2011-03-08 LAB — CLOSTRIDIUM DIFFICILE EIA
C difficile Toxins A+B, EIA: NEGATIVE
C difficile Toxins A+B, EIA: NEGATIVE

## 2011-03-08 LAB — HEMOGLOBIN A1C
Hgb A1c MFr Bld: 7.9 % — ABNORMAL HIGH (ref 4.6–6.1)
Mean Plasma Glucose: 180 mg/dL

## 2011-04-05 NOTE — H&P (Signed)
NAMEJERRICA, Brandi Bryant     ACCOUNT NO.:  [id number]   MEDICAL RECORD NO.:  [id number]          PATIENT TYPE:  INP   LOCATION:  1303                         FACILITY:  [facility]   PHYSICIAN:  Oswald Hillock, MD        DATE OF BIRTH:  August 28, 1956   DATE OF ADMISSION:  01/11/2009  DATE OF DISCHARGE:                              HISTORY & PHYSICAL   CHIEF COMPLAINT:  Severe back pain.   HISTORY OF PRESENT ILLNESS:  The patient is a 55 year old Hispanic  female who was recently diagnosed with breast cancer and is undergoing  chemotherapy at present with complaints of severe back pain.  History is  obtained through her son as she is unable to communicate in [place].  Apparently the patient woke up this morning complaining of some  abdominal discomfort, had a bowel movement, however, her pain got worse  and she has been complaining of severe back pain ever since.  She  describes the pain as sharp and worsened by certain positions and  movements.  She has had some nausea and vomiting at home but has not  vomited here in the emergency room.  No reports of any fever and patient  has been afebrile in the emergency room as well.  No reports of any  chest pain, any shortness of breath, palpitations, dizziness,  diaphoresis, loss of consciousness or any focal weakness of any part of  the body.  No urinary or fecal incontinence reported by the patient and  patient has been tolerating her chemotherapy well.   PAST MEDICAL HISTORY:  1. Hypertension.  2. Diabetes mellitus.  3. Breast cancer.   PAST SURGICAL HISTORY:  The patient has had mastectomy and an orthopedic  procedure done.   SOCIAL HISTORY:  No history of alcohol, tobacco or drug use.  The  patient lives with family.   CURRENT MEDICATIONS:  1. Toprol XL 25 mg daily.  2. Lisinopril/hydrochlorothiazide 10/12.5 once daily.  3. Aspirin 81 mg daily.  4. Metformin 500 mg twice a day.   ALLERGIES:  NO KNOWN DRUG ALLERGIES.   FAMILY  HISTORY:  No history of breast cancer in the family.  No history  of hypertension or diabetes in the family either.   REVIEW OF SYSTEMS:  Extensive review of systems was done and all systems  are negative except for the positives mentioned in the history of  present illness.  As mentioned about the history, review of systems was  obtained through her son.   PHYSICAL EXAMINATION:  VITALS ON ADMISSION:  Pulse of 82, blood pressure  117/92, respiratory rate of 20, temperature 98.4, O2 sats of 97% on room  air.  GENERAL:  The patient is conscious, alert, oriented to time, place and  person, in mild-to-moderate distress and at times complaining of severe  pain with visible distress noted.  HEENT:  No scleral icterus, no pallor.  Ears negative.  Poor oral dental  hygiene.  NECK:  Supple, no lymphadenopathy, no JVD, no carotid bruit.  CHEST:  Breath sounds heard bilaterally, good air entry, no added  sounds.  CVS:  S1 - S2 plus, regular.  No gallop, rub  or murmur appreciated.  ABDOMEN:  Soft, there is some deep nonspecific tenderness in the lower  quadrants however no guarding, no rebound.  Bowel sounds present.  EXTREMITIES:  No cyanosis, clubbing or edema.  BACK:  Reveals significant localized tenderness in the lower back,  however, no costovertebral tenderness noted.  NEUROLOGIC:  Cranial nerves II - XII are grossly intact.  MOTOR:  Upper extremities within normal limits.  Lower Extremities,  patient has power grade 5/5, reflexes are present, plantars are  bilaterally downgoing and the patient's SLR is about 90 degrees  bilaterally.  Movements of the lower extremities do not cause any pain  at the time of this examination.   LABORATORY DATA:  Her lumbosacral spine x-ray revealed no acute  abnormalities.  CT of the abdomen revealed diffuse fatty infiltration of  the liver with a 1.7 cm hyperattenuating lesion in the middle segment of  the left liver which may represent an area of fatty  sparing.  Another 15-  mm hyperattenuating lesion in the left kidney which could not be  assessed complicated by proteinaceous debris or hemorrhage, but renal  neoplasm could present with a similar appearance.   Her sodium was 135, potassium 2.4, chloride 100, CO2 - 25, glucose 186,  BUN 13, creatinine 0.82, bilirubin of 1.2, AST of 26, ALT of 23, alk  phos of 92, albumin of 3.4, calcium of 8.9, WBC count 4.2, hemoglobin  12.3, hematocrit 36.8, platelet count of 145.  Urine analysis showed  cloudy appearance, but negative nitrite and negative leukocytes.   IMPRESSION AND PLAN:  1. This is a case of a 55 year old Hispanic female with history of      hypertension, diabetes mellitus and recent diagnosis of breast      cancer on chemo who presents with severe back/abdominal pain.      Based on the work up until now there is a couple of positive      findings including the 1.5-cm lesion on the left kidney which could      represent hemorrhage/proteinaceous debris.  In addition to that,      low lytic or sclerotic osseous lesions noted on the pelvic CT.      Therefore the etiology of this sudden onset of severe      back/abdominal pain is unclear at this time.  Will admit her for      further evaluation and management.  An MRI has been ordered by the      Emergency Room physician to further quantify the lesions on the      left kidney and the liver, will follow up with the results.      Oncology has been consulted as well.  It is pertinent to mention      here that patient has no neuro deficits in the lower extremities      and her SLR in the lower extremities is within normal limits.  Will      put her on Dilaudid 4 mg intravenous q.4 hours on a p.r.n. basis      and give her scheduled Percocet every 6 hours as well.  2. Breast cancer, status post chemotherapy.  No reports of any fever      and her laboratory values are within normal limits.  We will      monitor her closely including  checking her temperature every 4      hours and order is in place with blood cultures for temperature  greater than 100.5 and patient will be started empirically on      antibiotics in case she becomes febrile.  3. Will also get a lactic acid level and amylase, lipase and ESR and      follow up with the results.  4. Diabetes mellitus, control unknown.  The patient is currently on      metformin 500 mg twice a day and an unknown oral hypoglycemic.      Will continue her on metformin and put her on sliding-scale insulin      coverage with regular insulin, sensitive scale.  5. Hypertension, good control.  Continue current medications, i.e.,      Toprol and lisinopril/hydrochlorothiazide.  6. Deep venous thrombosis/gastrointestinal prophylaxis.  Protonix and      compression devices.  The patient's aspirin will be on hold until      we get the results of MRI and rule out possible hemorrhage in the      left kidney.      Oswald Hillock, MD  Electronically Signed     BA/MEDQ  D:  01/11/2009  T:  01/12/2009  Job:  [id number]   cc:   Oncologist   Primary Care Physician

## 2011-04-05 NOTE — Op Note (Signed)
NAMESHAIRA, SOVA     ACCOUNT NO.:  [id number]   MEDICAL RECORD NO.:  [id number]          PATIENT TYPE:  AMB   LOCATION:  DSC                          FACILITY:  [facility]   PHYSICIAN:  Angelia Mould. Derrell Lolling, M.D.DATE OF BIRTH:  Mar 05, 1956   DATE OF PROCEDURE:  04/21/2009  DATE OF DISCHARGE:                               OPERATIVE REPORT   PREOPERATIVE DIAGNOSIS:  Invasive mammary carcinoma, left breast, upper  inner quadrant, clinical stage T1c, N0.   POSTOPERATIVE DIAGNOSIS:  Invasive mammary carcinoma, left breast, upper  inner quadrant, clinical stage T1c, N0.   OPERATIONS PERFORMED:  1. Injection of blue dye, left breast.  2. Left partial mastectomy with needle localization and specimen      mammogram.  3. Left axillary sentinel node mapping and biopsy.   SURGEON:  Angelia Mould. Derrell Lolling, MD   OPERATIVE INDICATIONS:  This is a 55 year old Hispanic woman from  [place] who felt a lump in her left breast in the upper inner quadrant  in January of this year.  On exam at that time, she had a palpable mass  in the upper inner quadrant of the left breast and images suggested a  1.8-cm mass 5 cm from the nipple at the 10:30 position.  A core biopsy  of this mass showed that this was an invasive ductal carcinoma, receptor  positive, HER-2 negative.  Clinically, the mass was fairly large  compared to the size of her breast and I felt that she would be a  difficult lumpectomy from a cosmetic standpoint.  She wanted to consider  breast conservation surgery.  She was evaluated by Dr. [name] who has  given her neoadjuvant chemotherapy, and she has responded to that to  where the tumor is not as palpable and by MRI is now a T1b, N0 lesion.  Dr. [name] felt that it was time to go ahead with surgery.  I felt that I  could do a better job with lumpectomy at this time.  She was brought to  the operating room.   OPERATIVE TECHNIQUE:  The patient underwent wire localization of her  left breast  mass at [facility] this morning and the  wire was very well placed through the marker clip.  The patient was  brought to [facility].  She underwent injection of  radionuclide by the nuclear medicine technician in the holding area.  The patient was taken to the operating room.  General anesthesia was  induced.  The patient was identified as the correct patient, correct  procedure, and correct site.  Radiographs were placed on the view box  and reviewed.  The left breast and left chest wall and axilla were  prepped and draped in a sterile fashion.   Prior to prepping, we injected 5 mL of blue dye in the left breast  subareolar area after an alcohol prep.  This was 2 mL of methylene blue  mixed with 3 mL of saline.  The breast was massaged for 5 minutes and  then she underwent prepping and draping.   Using a marking pen, I marked on the left breast where I  thought the  tumor was and marked a curved incision which was parallel to the areola.  Using the NeoProbe, I identified the location of increased radioactivity  and marked an incision there with a marking pen.  For both incisions, I  used 0.5% Marcaine with epinephrine as a local infiltration anesthetic.   I did the axillary surgery first and made a transverse incision in the  skin crease just at the hairline.  Dissection was carried down through  the subcutaneous tissue.  The clavipectoral fascia was incised and the  axilla was entered.  Using the NeoProbe as a guide, I isolated two very  hot but only slightly blue lymph nodes.  Both of these were removed.  Dr. Jimmy Picket performed imprint cytology and said they saw no cancer  cells in either node.  I did no further axillary lymph node work.  Hemostasis in the axilla was excellent.  This was achieved with  electrocautery and one vessel was tied off with 0 Vicryl tie.  The wound  was irrigated.  The clavipectoral fascia and deep tissues were closed   with interrupted sutures of 3-0 Vicryl.  Skin closed with a running  subcuticular suture of 4-0 Monocryl and Steri-Strips.   I then made the curvilinear incision in the left breast in the upper  inner quadrant over the area of the tumor.  Dissection was carried  widely around the tumor and all the way down to the chest wall.  The  deep margin included the pectoralis fascia.  I felt that I got the tumor  cleanly out.  The breast specimen was marked with the 6-color margin  marker kit.  A specimen radiograph was performed and Dr. Anselmo Pickler  took a look at this and said that everything looked fine.  The clip was  in the center of the specimen.  The specimen was sent for routine  histology.   I undermined the breast tissue inferiorly and superiorly to rebuild the  breast mound.  This was done at the level of pectoralis fascia and  electrocautery.  Hemostasis was excellently achieved with  electrocautery.  The wounds were irrigated with saline.  The deeper  breast tissues were reapproximated with interrupted sutures of 3-0  Vicryl and I placed 5 or 6 sutures.  The skin was closed with a running  subcuticular suture of 4-0 Monocryl and Steri-Strips.  Clean bandages  were placed and the patient taken recovery room in stable condition.  Estimated blood loss was about 30 mL.  Complications none.  Sponge,  needle, and instrument counts were correct.      Angelia Mould. Derrell Lolling, M.D.  Electronically Signed     HMI/MEDQ  D:  04/21/2009  T:  04/22/2009  Job:  [id number]   cc:   [name], MD  [facility]

## 2011-04-05 NOTE — Op Note (Signed)
[name], Brandi Bryant     ACCOUNT NO.:  [id number]   MEDICAL RECORD NO.:  [id number]          PATIENT TYPE:  AMB   LOCATION:  SDS                          FACILITY:  [facility]   PHYSICIAN:  Angelia Mould. Derrell Lolling, M.D.DATE OF BIRTH:  07-Dec-1955   DATE OF PROCEDURE:  05/27/2009  DATE OF DISCHARGE:  05/27/2009                               OPERATIVE REPORT   PREOPERATIVE DIAGNOSIS:  Invasive mammary carcinoma, left breast.   POSTOPERATIVE DIAGNOSIS:  Invasive mammary carcinoma, left breast,  desires Port-A-Cath removal.   OPERATION PERFORMED:  Removal of Port-A-Cath.   SURGEON:  Angelia Mould. Derrell Lolling, MD   OPERATIVE INDICATIONS:  This is a 55 year old Hispanic female who was  recently diagnosed with invasive mammary carcinoma of the left breast.  I inserted a Port-A-Cath a few months ago, she underwent neoadjuvant  chemotherapy, and then subsequently underwent left partial mastectomy,  and left sentinel node mapping and biopsy on April 21, 2009.  She had no  residual cancer in the breast and her sentinel node was negative.  Dr.  [name] has referred her radiation oncology and has referred her back to me  for the Port-A-Cath to be removed.  She was counseled as an outpatient  regarding the surgery.   OPERATIVE TECHNIQUE:  The patient was brought to the operating room.  Dr. Sheldon Silvan chose to use an LMA general anesthetic.  The right  shoulder was prepped and draped in a sterile fashion.  A surgical time-  out was held identifying the correct patient, correct procedure, and  correct site.  Intravenous antibiotics were given.  I observed and  palpated the port in the right infraclavicular area.  Xylocaine 1% with  epinephrine was used as local infiltration anesthetic.  Transverse  incision was made overlying the palpable port and dissection was carried  down through the subcutaneous tissue.  I dissected the port away from  the capsule.  I cut all 3 Prolene sutures and removed them.  I then  dissected the port, the locking device, the catheter, and they were  removed intact.  There was no bleeding.  The wound was irrigated with  saline.  Subcutaneous tissue was closed with interrupted sutures of 3-0  Vicryl and the skin closed with running subcuticular suture of 4-0  Monocryl and Steri-Strips.  Clean bandages were placed and the patient  was taken to recovery room in stable condition.  Estimated blood loss  was about 10 mL or less.  Complications none.  Sponge, needles, and  instrument counts were correct.      Angelia Mould. Derrell Lolling, M.D.  Electronically Signed     HMI/MEDQ  D:  05/27/2009  T:  05/27/2009  Job:  [id number]   cc:   [name], MD

## 2011-04-05 NOTE — Discharge Summary (Signed)
Brandi Bryant, Brandi Bryant     ACCOUNT NO.:  [id number]   MEDICAL RECORD NO.:  [id number]          PATIENT TYPE:  INP   LOCATION:  1303                         FACILITY:  [facility]   PHYSICIAN:  [name], M.D.   DATE OF BIRTH:  09/25/1956   DATE OF ADMISSION:  01/11/2009  DATE OF DISCHARGE:  01/19/2009                               DISCHARGE SUMMARY   PRIMARY CARE PHYSICIAN:  None.   ONCOLOGIST:  [name], M.D.   DISCHARGE DIAGNOSES:  1. Back pain secondary to degenerative disk disease with impingement      of the left S1 nerve root, status post MRI scan.  2. Metastatic breast cancer.  3. Colitis/gastritis, infectious versus chemotherapy related.  4. Normocytic anemia.  5. Hypokalemia.  6. Diabetes mellitus.  7. Left renal cyst.  8. Gastroesophageal reflux disease.  9. Fatty liver.  10.Hypotension.  11.Leukocytosis.  12.Headache.   DISCHARGE MEDICATIONS:  1. Metformin 500 mg b.i.d.  2. Lisinopril/HCTZ 10/12.5 mg daily.  3. Percocet 5/325 one tab q. 4 h p.r.n.  4. Dexamethasone 4 mg, 2 tab q.12 h one day prior to and the day after      chemotherapy.  5. Cipro 500 mg b.i.d. x4 more days.  6. Flagyl 500 mg t.i.d. x4 more days.  7. Protonix 40 mg daily.  8. Motrin 400 mg q.6 h p.r.n. mild pain.   CONSULTATIONS:  Dr. [name] of oncology.   BRIEF ADMISSION HISTORY OF PRESENT ILLNESS:  The patient is a 55-year-  old Hispanic female recently diagnosed with breast cancer and under the  care of Dr. [name] who presented to the emergency department with the chief  complaint of severe back pain.  The patient had recently been treated  with chemotherapy and subsequent to this received Neulasta.  She was  admitted for further evaluation and workup.  For the full details,  please see the dictated report done by Dr. [name].   PROCEDURES AND DIAGNOSTIC STUDIES:  1. Lumbar spine films, 4 views, on January 11, 2009 showed no acute      bony abnormality.  2. CT scan of the abdomen and  pelvis on January 11, 2009 showed      diffuse fatty infiltration of the liver.  A 1.7-cm hyperattenuating      lesion in the medial segment of the left liver which may represent      an area of fatty sparing, but this cannot be confirmed.  A 15-mm      hyperattenuating lesion in the left kidney.  This could be a cyst      complicated by proteinaceous debris or hemorrhage, but a renal      neoplasm could present with a similar appearance.  Recommendations      are to perform a dedicated renal MRI to further characterize.  No      distal urinary stones.  Unenhanced CT was performed per clinician      order.  Lack of IV contrast limited the sensitivity and specificity      for the evaluation of abdominal/pelvic solid viscera.  3. CT scan of the abdomen and pelvis on January 13, 2009 showed new  mild diffuse colitis.  No evidence of the abscess.  A 1.8-cm      indeterminate low attenuation lesion in the mid pole of the left      kidney.  Renal mass cannot be excluded.  Abdominal MRI without and      with contrast is recommended for further characterization.  Diffuse      fatty infiltration of the liver.  No evidence of liver mass.  No      findings in the pelvis.  4. MRI of the thoracic spine on January 13, 2009 was negative for      metastatic disease.  Negative for fracture.  Thoracic disk      degeneration.  The largest disk protrusion is central to right-      sided at T4-5.  5. MRI of the lumbar spine on January 13, 2009 was negative for      fracture or metastatic disease.  Small left-sided disk protrusion      at L5-S1 with impingement of the left S1 nerve root.   DISCHARGE LABORATORY VALUES:  Sodium was 133, potassium 4.0, chloride  104, bicarb 24, BUN 3, creatinine 0.46, glucose 135.  White blood cell  count was 8.8, hemoglobin 11.4, hematocrit 34, platelets 170.  C.  difficile toxin studies were negative x2.  Hemoglobin A1c was 7.9%.   HOSPITAL COURSE BY PROBLEM:  1.  Severe back pain:  The patient's severe back pain was felt to be      multifactorial and likely due to administration of Neulasta in the      setting of degenerative disk disease with minor S1 nerve      impingement.  The patient's back pain improved with conservative      therapy including pain medications as needed.  2. Metastatic breast cancer:  The patient has been under the care of      Dr. [name] for this.  She has received chemotherapy with Cytoxan and      Taxotere approximately 2 weeks ago.  She will follow up with Dr. [name]      as an outpatient.  3. Colitis/gastritis:  Unclear if this was triggered by chemotherapy      versus an infectious etiology.  The patient did have marked      elevation of her white blood cell count which could have been due      to treatment with Neulasta.  She was empirically initially put on      Flagyl for empiric treatment of Clostridium difficile colitis.      Clostridium difficile toxin studies were then found to be negative      x2.  Because of persistent elevation of her white blood cell count,      her antibiotic coverage was broadened to include Cipro on January 16, 2009.  With empiric antibiotics, the patient's white blood cell      count began to decline and is now within normal limits.  The      patient will complete an additional 4 days of therapy with Cipro      and Flagyl.  4. Normocytic anemia:  Likely due to bone marrow suppression from      recent chemotherapy.  The patient's hemoglobin and hematocrit have      remained stable throughout her hospital stay.  5. Hypokalemia:  The patient was appropriately repleted.  6. Diabetes mellitus:  The patient's hemoglobin A1c is 7.9%, which  corresponds to a mean plasma glucose of 180.  She needs better      outpatient control.  She does not have a primary care physician and      she has been instructed to call [phone] to obtain one for help      with managing of her underlying chronic  medical conditions      including diabetes.  The patient's glycemic control was reasonably      good while in the hospital with ongoing administration of metformin      and sliding scale insulin.  7. Left renal cyst versus mass:  The patient will need an outpatient      dedicated MRI to evaluate this.  8. Gastroesophageal reflux disease:  The patient was maintained on      proton pump inhibitor therapy and will be discharged on same.  9. Fatty liver:  The patient was maintained on a low-fat/carbohydrate-      modified diet.  10.Hypotension:  The patient's hypotension has resolved.  Her      discharge blood pressure is 136/71.  11.Leukocytosis:  The patient's leukocytosis was either due to therapy      with Neulasta versus infectious colitis.  Her white blood cell      count has normalized.  12.Headache:  Possibly Neulasta induced.  Improved with empiric      treatment with Toradol.  The patient is encouraged to use over-the-      counter Motrin for mild pain and headache.   DISPOSITION:  The patient is medically stable for discharge and will be  discharged home.  She will follow up with Dr. [name].  She is encouraged to  call [phone] to get a referral to a primary care physician for local  management of her chronic medical problems.  She will need outpatient  dedicated MRI to fully characterize the left renal mass seen on scans  here.   Time spent coordinating care for discharge and discharge instructions  equals 35 minutes.      [name], M.D.  Electronically Signed     CR/MEDQ  D:  01/19/2009  T:  01/19/2009  Job:  [id number]   cc:   [name], MD

## 2011-04-05 NOTE — Op Note (Signed)
Brandi Bryant, Brandi Bryant     ACCOUNT NO.:  [id number]   MEDICAL RECORD NO.:  [id number]          PATIENT TYPE:  AMB   LOCATION:  SDS                          FACILITY:  [facility]   PHYSICIAN:  Angelia Mould. Derrell Lolling, M.D.DATE OF BIRTH:  [birth year]   DATE OF PROCEDURE:  01/01/2009  DATE OF DISCHARGE:                               OPERATIVE REPORT   PREOPERATIVE DIAGNOSIS:  Cancer, left breast.   POSTOPERATIVE DIAGNOSIS:  Cancer, left breast.   OPERATION PERFORMED:  Insertion of X-Port venous vascular access device  with fluoroscopic guidance.   SURGEON:  Angelia Mould. Derrell Lolling, MD   OPERATIVE INDICATIONS:  This is a 55 year old Hispanic woman, who felt a  lump in her left breast in the upper inner quadrant and had mammograms  and ultrasounds.  The x-rays suggested a 1.8-cm mass in the upper inner  quadrant of the left breast.  On exam, her breasts are small, and this  mass feels much bigger, perhaps 3 cm in size.  I felt that it would be  difficult to do a lumpectomy with reasonable cosmetic result at this  point.  I asked the patient to see Dr. [name] in the [facility].  He agreed that neoadjuvant chemotherapy would be a reasonable approach.  He has recommended Port-A-Cath insertion.  I have counseled the patient  about the details of that procedure and its risks, and she expresses  understanding with the use of a translator.  All of her questions were  answered with the use of a translator.  She is in full agreement with  this plan.  She is brought to the operating room electively.   OPERATIVE TECHNIQUE:  The patient was placed supine on the operating  table.  A general anesthetic with an LMA device was used.  A small roll  was placed behind her shoulders and her arms were placed at her side  with appropriate padding.  The neck and chest were prepped and draped in  a sterile fashion.  Intravenous antibiotics were given.  The patient was  identified as correct patient and  correct procedure, and a surgical time-  out was held.  Xylocaine 1% with epinephrine was used as a local  infiltration anesthetic.  A right subclavian venipuncture was performed,  and a guidewire inserted into the superior vena cava under fluoroscopic  guidance.  Using the fluoroscope, I marked on the chest wall the place  where I thought the tip of the catheter should be in the superior vena  cava just above the right atrial junction.  A small incision was made at  the wire insertion site.  I made a transverse incision about 2 cm below  the wire insertion site and created a subcutaneous pocket.  The catheter  was drawn between the wire insertion site and the port pocket site using  a tunneling device.  I placed the catheter on the skin and measured the  appropriate length and then cut the catheter at 18 cm.  The catheter was  secured to the port with a locking device and flushed with heparinized  saline.  The port was sutured to the pectoralis fascia with 3  interrupted sutures of 2-0 Prolene.  The port was flushed one more time.  With the patient back in Trendelenburg position, I inserted the dilator  and peel-away sheath assembly over the guidewire into the central venous  circulation.  I removed the dilator and the wire.  I inserted the  catheter through the peel-away sheath and then removed the peel-away  sheath.  I could flush the catheter easily with heparinized saline and I  had good blood return.  Fluoroscopy was again used to assess the  catheter, and there was no deformity of the catheter anywhere along its  course, and the catheter was found to be in the distal superior vena  cava near the right atrial junction.  Everything looked fine.  The port  was then flushed with concentrated heparin solution.  Subcutaneous  tissue was closed with 3-0 Vicryl sutures.  Both skin incisions were closed with subcuticular suture of 4-0 Monocryl  and Steri-Strips.  Clean bandages were placed,  and the patient was taken  to recovery room in stable condition.  Estimated blood loss was about 10  mL.  Complications none.  Sponge, needle, and instrument counts were  correct.      Angelia Mould. Derrell Lolling, M.D.  Electronically Signed     HMI/MEDQ  D:  01/01/2009  T:  01/01/2009  Job:  [id number]   cc:   [name], MD

## 2011-06-23 ENCOUNTER — Other Ambulatory Visit: Payer: Self-pay | Admitting: Oncology

## 2011-06-23 ENCOUNTER — Encounter (HOSPITAL_BASED_OUTPATIENT_CLINIC_OR_DEPARTMENT_OTHER): Payer: Self-pay | Admitting: Oncology

## 2011-06-23 DIAGNOSIS — C50219 Malignant neoplasm of upper-inner quadrant of unspecified female breast: Secondary | ICD-10-CM

## 2011-06-23 DIAGNOSIS — Z853 Personal history of malignant neoplasm of breast: Secondary | ICD-10-CM

## 2011-06-23 DIAGNOSIS — Z170 Estrogen receptor positive status [ER+]: Secondary | ICD-10-CM

## 2011-06-23 DIAGNOSIS — Z9889 Other specified postprocedural states: Secondary | ICD-10-CM

## 2011-06-23 LAB — CBC WITH DIFFERENTIAL/PLATELET
BASO%: 0.5 % (ref 0.0–2.0)
MCHC: 34.6 g/dL (ref 31.5–36.0)
MONO#: 0.4 10*3/uL (ref 0.1–0.9)
RBC: 3.83 10*6/uL (ref 3.70–5.45)
RDW: 13.7 % (ref 11.2–14.5)
WBC: 4.4 10*3/uL (ref 3.9–10.3)
lymph#: 1.4 10*3/uL (ref 0.9–3.3)

## 2011-06-23 LAB — FOLLICLE STIMULATING HORMONE: FSH: 20.3 m[IU]/mL

## 2011-06-23 LAB — COMPREHENSIVE METABOLIC PANEL
ALT: 23 U/L (ref 0–35)
Alkaline Phosphatase: 76 U/L (ref 39–117)
CO2: 22 mEq/L (ref 19–32)
Creatinine, Ser: 0.8 mg/dL (ref 0.50–1.10)
Sodium: 139 mEq/L (ref 135–145)
Total Bilirubin: 0.6 mg/dL (ref 0.3–1.2)

## 2011-06-29 LAB — ESTRADIOL, ULTRA SENS

## 2011-08-26 LAB — URINALYSIS, ROUTINE W REFLEX MICROSCOPIC
Glucose, UA: NEGATIVE mg/dL
Hgb urine dipstick: NEGATIVE
Ketones, ur: 40 mg/dL — AB
Protein, ur: NEGATIVE mg/dL

## 2011-08-26 LAB — POCT I-STAT, CHEM 8
BUN: 8 mg/dL (ref 6–23)
Calcium, Ion: 1.12 mmol/L (ref 1.12–1.32)
Creatinine, Ser: 0.6 mg/dL (ref 0.4–1.2)
Glucose, Bld: 185 mg/dL — ABNORMAL HIGH (ref 70–99)
TCO2: 25 mmol/L (ref 0–100)

## 2011-11-04 ENCOUNTER — Ambulatory Visit
Admission: RE | Admit: 2011-11-04 | Discharge: 2011-11-04 | Disposition: A | Discharge status: Home / Self Care | Payer: Self-pay | Source: Ambulatory Visit | Attending: Oncology | Admitting: Oncology

## 2011-11-04 ENCOUNTER — Other Ambulatory Visit: Payer: Self-pay | Admitting: Oncology

## 2011-11-04 DIAGNOSIS — Z853 Personal history of malignant neoplasm of breast: Secondary | ICD-10-CM

## 2011-11-04 DIAGNOSIS — Z9889 Other specified postprocedural states: Secondary | ICD-10-CM

## 2011-11-04 NOTE — Progress Notes (Signed)
Interpreter Wyvonnia Dusky for mammogram 10.30

## 2011-12-03 ENCOUNTER — Telehealth: Payer: Self-pay | Admitting: Oncology

## 2011-12-03 NOTE — Telephone Encounter (Signed)
l/m and mailed appt info for 01/05/12   aom

## 2011-12-27 ENCOUNTER — Encounter: Payer: Self-pay | Admitting: *Deleted

## 2012-01-01 ENCOUNTER — Encounter: Payer: Self-pay | Admitting: Oncology

## 2012-01-01 DIAGNOSIS — I10 Essential (primary) hypertension: Secondary | ICD-10-CM | POA: Insufficient documentation

## 2012-01-01 DIAGNOSIS — E119 Type 2 diabetes mellitus without complications: Secondary | ICD-10-CM | POA: Insufficient documentation

## 2012-01-05 ENCOUNTER — Other Ambulatory Visit: Payer: Self-pay | Admitting: Lab

## 2012-01-05 ENCOUNTER — Ambulatory Visit: Payer: Self-pay | Admitting: Oncology

## 2012-01-07 NOTE — Progress Notes (Signed)
No-show.  Will reschedule

## 2012-01-10 ENCOUNTER — Telehealth: Payer: Self-pay | Admitting: Oncology

## 2012-01-10 NOTE — Telephone Encounter (Signed)
called pts home lmovm with appt for 03/20 and to rtn call to confirm appt d/t

## 2012-01-31 ENCOUNTER — Encounter (HOSPITAL_COMMUNITY): Payer: Self-pay | Admitting: *Deleted

## 2012-01-31 ENCOUNTER — Inpatient Hospital Stay (HOSPITAL_COMMUNITY)
Admission: EM | Admit: 2012-01-31 | Discharge: 2012-02-02 | DRG: 690 | Disposition: A | Discharge status: Home / Self Care | Payer: Self-pay | Attending: Family Medicine | Admitting: Family Medicine

## 2012-01-31 ENCOUNTER — Emergency Department (HOSPITAL_COMMUNITY): Payer: Self-pay

## 2012-01-31 DIAGNOSIS — Z853 Personal history of malignant neoplasm of breast: Secondary | ICD-10-CM

## 2012-01-31 DIAGNOSIS — K219 Gastro-esophageal reflux disease without esophagitis: Secondary | ICD-10-CM

## 2012-01-31 DIAGNOSIS — K2990 Gastroduodenitis, unspecified, without bleeding: Secondary | ICD-10-CM

## 2012-01-31 DIAGNOSIS — R1115 Cyclical vomiting syndrome unrelated to migraine: Secondary | ICD-10-CM | POA: Diagnosis present

## 2012-01-31 DIAGNOSIS — K2970 Gastritis, unspecified, without bleeding: Secondary | ICD-10-CM

## 2012-01-31 DIAGNOSIS — E876 Hypokalemia: Secondary | ICD-10-CM | POA: Diagnosis present

## 2012-01-31 DIAGNOSIS — E119 Type 2 diabetes mellitus without complications: Secondary | ICD-10-CM | POA: Insufficient documentation

## 2012-01-31 DIAGNOSIS — E669 Obesity, unspecified: Secondary | ICD-10-CM | POA: Diagnosis present

## 2012-01-31 DIAGNOSIS — R109 Unspecified abdominal pain: Secondary | ICD-10-CM | POA: Diagnosis present

## 2012-01-31 DIAGNOSIS — K8020 Calculus of gallbladder without cholecystitis without obstruction: Secondary | ICD-10-CM | POA: Diagnosis present

## 2012-01-31 DIAGNOSIS — R112 Nausea with vomiting, unspecified: Secondary | ICD-10-CM | POA: Diagnosis present

## 2012-01-31 DIAGNOSIS — A498 Other bacterial infections of unspecified site: Secondary | ICD-10-CM | POA: Diagnosis present

## 2012-01-31 DIAGNOSIS — R51 Headache: Secondary | ICD-10-CM | POA: Diagnosis present

## 2012-01-31 DIAGNOSIS — I10 Essential (primary) hypertension: Secondary | ICD-10-CM | POA: Insufficient documentation

## 2012-01-31 DIAGNOSIS — E860 Dehydration: Secondary | ICD-10-CM

## 2012-01-31 DIAGNOSIS — N390 Urinary tract infection, site not specified: Principal | ICD-10-CM

## 2012-01-31 LAB — LACTIC ACID, PLASMA: Lactic Acid, Venous: 2.2 mmol/L (ref 0.5–2.2)

## 2012-01-31 LAB — COMPREHENSIVE METABOLIC PANEL
ALT: 30 U/L (ref 0–35)
AST: 130 U/L — ABNORMAL HIGH (ref 0–37)
Alkaline Phosphatase: 105 U/L (ref 39–117)
BUN: 10 mg/dL (ref 6–23)
CO2: 22 mEq/L (ref 19–32)
CO2: 24 mEq/L (ref 19–32)
Calcium: 8 mg/dL — ABNORMAL LOW (ref 8.4–10.5)
Chloride: 103 mEq/L (ref 96–112)
Creatinine, Ser: 0.54 mg/dL (ref 0.50–1.10)
GFR calc Af Amer: >90 mL/min (ref >90)
GFR calc Af Amer: >90 mL/min (ref >90)
GFR calc non Af Amer: >90 mL/min (ref >90)
GFR calc non Af Amer: >90 mL/min (ref >90)
Glucose, Bld: 264 mg/dL — ABNORMAL HIGH (ref 70–99)
Potassium: 3.6 mEq/L (ref 3.5–5.1)
Sodium: 138 mEq/L (ref 135–145)
Total Bilirubin: 0.7 mg/dL (ref 0.3–1.2)
Total Protein: 6.8 g/dL (ref 6.0–8.3)
Total Protein: 6.3 g/dL (ref 6.0–8.3)

## 2012-01-31 LAB — DIFFERENTIAL
Lymphocytes Relative: 6 % — ABNORMAL LOW (ref 12–46)
Lymphs Abs: 0.6 10*3/uL — ABNORMAL LOW (ref 0.7–4.0)
Monocytes Relative: 2 % — ABNORMAL LOW (ref 3–12)
Neutro Abs: 9.5 10*3/uL — ABNORMAL HIGH (ref 1.7–7.7)
Neutrophils Relative %: 91 % — ABNORMAL HIGH (ref 43–77)

## 2012-01-31 LAB — URINALYSIS, ROUTINE W REFLEX MICROSCOPIC
Bilirubin Urine: NEGATIVE
Hgb urine dipstick: NEGATIVE
Ketones, ur: NEGATIVE mg/dL
Specific Gravity, Urine: 1.02 (ref 1.005–1.030)
Urobilinogen, UA: 0.2 mg/dL (ref 0.0–1.0)

## 2012-01-31 LAB — URINE MICROSCOPIC-ADD ON

## 2012-01-31 LAB — CBC
Hemoglobin: 13.8 g/dL (ref 12.0–15.0)
Platelets: 149 10*3/uL — ABNORMAL LOW (ref 150–400)
RBC: 4.58 MIL/uL (ref 3.87–5.11)
WBC: 10.4 10*3/uL (ref 4.0–10.5)

## 2012-01-31 LAB — LIPASE, BLOOD: Lipase: 13 U/L (ref 11–59)

## 2012-01-31 MED ORDER — POTASSIUM CHLORIDE IN NACL 20-0.9 MEQ/L-% IV SOLN
INTRAVENOUS | Status: DC
  Administered 2012-01-31 – 2012-02-02 (×4): via INTRAVENOUS
  Filled 2012-01-31 (×8): qty 1000

## 2012-01-31 MED ORDER — DEXTROSE 5 % IV SOLN
1.0000 g | INTRAVENOUS | Status: DC
  Administered 2012-02-01: 1 g via INTRAVENOUS
  Filled 2012-01-31 (×2): qty 10

## 2012-01-31 MED ORDER — PANTOPRAZOLE SODIUM 40 MG IV SOLR
40.0000 mg | Freq: Two times a day (BID) | INTRAVENOUS | Status: DC
  Administered 2012-01-31 – 2012-02-01 (×3): 40 mg via INTRAVENOUS
  Filled 2012-01-31 (×5): qty 40

## 2012-01-31 MED ORDER — LISINOPRIL 10 MG PO TABS
10.0000 mg | ORAL_TABLET | Freq: Every day | ORAL | Status: DC
  Administered 2012-01-31 – 2012-02-02 (×2): 10 mg via ORAL
  Filled 2012-01-31 (×3): qty 1

## 2012-01-31 MED ORDER — ACETAMINOPHEN 650 MG RE SUPP: 650.0000 mg | Freq: Four times a day (QID) | RECTAL | Status: DC | PRN

## 2012-01-31 MED ORDER — ACETAMINOPHEN 325 MG PO TABS
650.0000 mg | ORAL_TABLET | Freq: Four times a day (QID) | ORAL | Status: DC | PRN
  Administered 2012-02-01 (×2): 650 mg via ORAL
  Filled 2012-01-31 (×2): qty 2

## 2012-01-31 MED ORDER — PROMETHAZINE HCL 12.5 MG PO TABS
12.5000 mg | ORAL_TABLET | Freq: Four times a day (QID) | ORAL | Status: DC | PRN
  Administered 2012-02-01: 12.5 mg via ORAL
  Filled 2012-01-31 (×2): qty 1

## 2012-01-31 MED ORDER — TAMOXIFEN CITRATE 20 MG PO TABS: 20.0000 mg | ORAL_TABLET | Freq: Every day | ORAL | Status: DC

## 2012-01-31 MED ORDER — LORAZEPAM 2 MG/ML IJ SOLN
1.0000 mg | Freq: Once | INTRAMUSCULAR | Status: AC
  Administered 2012-01-31: 1 mg via INTRAVENOUS
  Filled 2012-01-31: qty 1

## 2012-01-31 MED ORDER — IOHEXOL 300 MG/ML  SOLN
20.0000 mL | INTRAMUSCULAR | Status: AC
  Administered 2012-01-31 (×2): 20 mL via ORAL

## 2012-01-31 MED ORDER — HYDROCHLOROTHIAZIDE 12.5 MG PO CAPS
12.5000 mg | ORAL_CAPSULE | Freq: Every day | ORAL | Status: DC
  Administered 2012-01-31 – 2012-02-02 (×2): 12.5 mg via ORAL
  Filled 2012-01-31 (×3): qty 1

## 2012-01-31 MED ORDER — ONDANSETRON HCL 4 MG/2ML IJ SOLN
4.0000 mg | Freq: Once | INTRAMUSCULAR | Status: AC
  Administered 2012-01-31: 4 mg via INTRAVENOUS
  Filled 2012-01-31: qty 2

## 2012-01-31 MED ORDER — PROMETHAZINE HCL 12.5 MG PO TABS
12.5000 mg | ORAL_TABLET | Freq: Once | ORAL | Status: AC
  Administered 2012-01-31: 12.5 mg via ORAL
  Filled 2012-01-31 (×2): qty 1

## 2012-01-31 MED ORDER — LISINOPRIL-HYDROCHLOROTHIAZIDE 10-12.5 MG PO TABS: 1.0000 | ORAL_TABLET | Freq: Every day | ORAL | Status: DC

## 2012-01-31 MED ORDER — HYDROMORPHONE HCL PF 1 MG/ML IJ SOLN
0.5000 mg | INTRAMUSCULAR | Status: DC | PRN
  Administered 2012-01-31 – 2012-02-01 (×2): 0.5 mg via INTRAVENOUS
  Filled 2012-01-31 (×2): qty 1

## 2012-01-31 MED ORDER — SODIUM CHLORIDE 0.9 % IV SOLN
1000.0000 mL | Freq: Once | INTRAVENOUS | Status: AC
  Administered 2012-01-31: 1000 mL via INTRAVENOUS

## 2012-01-31 MED ORDER — METOCLOPRAMIDE HCL 5 MG/ML IJ SOLN
INTRAMUSCULAR | Status: AC
  Filled 2012-01-31: qty 2

## 2012-01-31 MED ORDER — IOHEXOL 300 MG/ML  SOLN
100.0000 mL | Freq: Once | INTRAMUSCULAR | Status: AC | PRN
  Administered 2012-01-31: 100 mL via INTRAVENOUS

## 2012-01-31 MED ORDER — METOCLOPRAMIDE HCL 5 MG/ML IJ SOLN
10.0000 mg | Freq: Once | INTRAMUSCULAR | Status: AC
  Administered 2012-01-31: 10 mg via INTRAVENOUS

## 2012-01-31 MED ORDER — INSULIN ASPART 100 UNIT/ML ~~LOC~~ SOLN
0.0000 [IU] | Freq: Three times a day (TID) | SUBCUTANEOUS | Status: DC
  Administered 2012-02-01: 3 [IU] via SUBCUTANEOUS

## 2012-01-31 MED ORDER — HYDROMORPHONE HCL PF 1 MG/ML IJ SOLN
1.0000 mg | Freq: Once | INTRAMUSCULAR | Status: AC
  Administered 2012-01-31: 1 mg via INTRAVENOUS
  Filled 2012-01-31: qty 1

## 2012-01-31 MED ORDER — DEXTROSE 5 % IV SOLN
1.0000 g | Freq: Once | INTRAVENOUS | Status: AC
  Administered 2012-01-31: 1 g via INTRAVENOUS
  Filled 2012-01-31: qty 10

## 2012-01-31 MED ORDER — SODIUM CHLORIDE 0.9 % IJ SOLN
3.0000 mL | Freq: Two times a day (BID) | INTRAMUSCULAR | Status: DC
  Administered 2012-01-31 – 2012-02-01 (×2): 3 mL via INTRAVENOUS

## 2012-01-31 MED ORDER — TAMOXIFEN CITRATE 10 MG PO TABS
20.0000 mg | ORAL_TABLET | Freq: Every day | ORAL | Status: DC
  Administered 2012-01-31 – 2012-02-02 (×3): 20 mg via ORAL
  Filled 2012-01-31 (×3): qty 2

## 2012-01-31 NOTE — ED Notes (Signed)
Pt moved to CDU 11, report given to Joaquin, Charity fundraiser.

## 2012-01-31 NOTE — ED Notes (Signed)
Pt presents to department for evaluation of N/V and headache. Onset last night. Pt states pain became worse this morning. Also states chills and fatigue. 8/10 "throbbing" headache at the time. Diffuse abdominal pain, no urinary symptoms. Abdomen soft and non tender to palpation. Bowel sounds present. Denies diarrhea. Family at bedside to translate. She is conscious alert and oriented x4. No active vomiting at the time. No signs of acute distress noted.

## 2012-01-31 NOTE — H&P (Signed)
Brandi Bryant is an 56 y.o. female.    PCP: Dr. Quintella Reichert in [place].   Chief Complaint: abdominal pain, nausea/emesis (son present at interview for interpretor)  HPI: 56 yo female with history of DM and HTN presents with acute onset abdominal pain, nausea and emesis. Symptoms begin last evening with pain in epigastric/periubmilical region as patient was lying in bed. She then developed some nausea, headache and emesis began this morning upon wakening. She denies any history of this pain, reflux or other GI issues. Had normal BM this morning, denies hematemesis, hematochezia, vaginal discharge, abnormal bleeding.   She presented to ED, found to have dehydration, intractable n/v, pain and started on IV hydration. Studies include CT showing cholelithiasis, borderline AST, lactate elevated to 3.3 improving to 2.2 after hydration, UA showing UTI and mildly elevated blood sugar otherwise unremarkable studies. Asked to admit for uncontrolled emesis, pain and further workup.    Started on rocephin IV, NS bolus x 2 given. In ED dosed with zofran 12mg , reglan 10mg  , ativan 1mg , and dilaudid 2mg  total since arrival ~10am.   Past Medical History  Diagnosis Date  . Breast cancer   . HTN (hypertension)   . Diabetes mellitus     Past Surgical History  Procedure Date  . Breast lumpectomy   . Tubal ligation     No family history on file. Social History:  reports that she has never smoked. She does not have any smokeless tobacco history on file. She reports that she does not drink alcohol or use illicit drugs.  Allergies:  Allergies  Allergen Reactions  . Gadolinium Derivatives     Code: VOM, Desc: Pt began vomiting 45 sec after MRI contrast injection of Multihance, Onset Date: [id number]      Medications Prior to Admission  Medication Dose Route Frequency Provider Last Rate Last Dose  . 0.9 %  sodium chloride infusion  1,000 mL Intravenous Once Amy Coker, MD   1,000 mL at 01/31/12 1057  .  0.9 %  sodium chloride infusion  1,000 mL Intravenous Once Particia Lather, MD   1,000 mL at 01/31/12 1155  . cefTRIAXone (ROCEPHIN) 1 g in dextrose 5 % 50 mL IVPB  1 g Intravenous Once Particia Lather, MD   1 g at 01/31/12 1156  . HYDROmorphone (DILAUDID) injection 1 mg  1 mg Intravenous Once Particia Lather, MD   1 mg at 01/31/12 1057  . HYDROmorphone (DILAUDID) injection 1 mg  1 mg Intravenous Once Otilio Miu, PA   1 mg at 01/31/12 1615  . iohexol (OMNIPAQUE) 300 MG/ML solution 100 mL  100 mL Intravenous Once PRN Medication Radiologist, MD   100 mL at 01/31/12 1541  . iohexol (OMNIPAQUE) 300 MG/ML solution 20 mL  20 mL Oral Q1 Hr x 2 Medication Radiologist, MD   20 mL at 01/31/12 1230  . LORazepam (ATIVAN) injection 1 mg  1 mg Intravenous Once Otilio Miu, PA      . metoCLOPramide (REGLAN) injection 10 mg  10 mg Intravenous Once Particia Lather, MD   10 mg at 01/31/12 1202  . ondansetron (ZOFRAN) injection 4 mg  4 mg Intravenous Once Particia Lather, MD   4 mg at 01/31/12 1057  . ondansetron (ZOFRAN) injection 4 mg  4 mg Intravenous Once Particia Lather, MD   4 mg at 01/31/12 1155  . ondansetron (ZOFRAN) injection 4 mg  4 mg Intravenous Once Otilio Miu, PA   4 mg at 01/31/12 1615  Medications Prior to Admission  Medication Sig Dispense Refill  . lisinopril-hydrochlorothiazide (PRINZIDE,ZESTORETIC) 10-12.5 MG per tablet Take 1 tablet by mouth daily.      . metFORMIN (GLUCOPHAGE) 500 MG tablet Take 500 mg by mouth 2 (two) times daily with a meal.      . tamoxifen (NOLVADEX) 20 MG tablet Take 20 mg by mouth daily.        Results for orders placed during the hospital encounter of 01/31/12 (from the past 48 hour(s))  CBC     Status: Abnormal   Collection Time   01/31/12 10:37 AM      Component Value Range Comment   WBC 10.4  4.0 - 10.5 (K/uL)    RBC 4.58  3.87 - 5.11 (MIL/uL)    Hemoglobin 13.8  12.0 - 15.0 (g/dL)    HCT 10.2  72.5 - 36.6 (%)    MCV 86.7  78.0 - 100.0 (fL)    MCH 30.1  26.0  - 34.0 (pg)    MCHC 34.8  30.0 - 36.0 (g/dL)    RDW 44.0  34.7 - 42.5 (%)    Platelets 149 (*) 150 - 400 (K/uL)   DIFFERENTIAL     Status: Abnormal   Collection Time   01/31/12 10:37 AM      Component Value Range Comment   Neutrophils Relative 91 (*) 43 - 77 (%)    Neutro Abs 9.5 (*) 1.7 - 7.7 (K/uL)    Lymphocytes Relative 6 (*) 12 - 46 (%)    Lymphs Abs 0.6 (*) 0.7 - 4.0 (K/uL)    Monocytes Relative 2 (*) 3 - 12 (%)    Monocytes Absolute 0.2  0.1 - 1.0 (K/uL)    Eosinophils Relative 1  0 - 5 (%)    Eosinophils Absolute 0.1  0.0 - 0.7 (K/uL)    Basophils Relative 0  0 - 1 (%)    Basophils Absolute 0.0  0.0 - 0.1 (K/uL)   COMPREHENSIVE METABOLIC PANEL     Status: Abnormal   Collection Time   01/31/12 10:37 AM      Component Value Range Comment   Sodium 138  135 - 145 (mEq/L)    Potassium 3.6  3.5 - 5.1 (mEq/L)    Chloride 103  96 - 112 (mEq/L)    CO2 22  19 - 32 (mEq/L)    Glucose, Bld 264 (*) 70 - 99 (mg/dL)    BUN 10  6 - 23 (mg/dL)    Creatinine, Ser 9.56  0.50 - 1.10 (mg/dL)    Calcium 8.8  8.4 - 10.5 (mg/dL)    Total Protein 6.8  6.0 - 8.3 (g/dL)    Albumin 3.4 (*) 3.5 - 5.2 (g/dL)    AST 39 (*) 0 - 37 (U/L)    ALT 30  0 - 35 (U/L)    Alkaline Phosphatase 105  39 - 117 (U/L)    Total Bilirubin 0.7  0.3 - 1.2 (mg/dL)    GFR calc non Af Amer >90  >90 (mL/min)    GFR calc Af Amer >90  >90 (mL/min)   LIPASE, BLOOD     Status: Normal   Collection Time   01/31/12 10:37 AM      Component Value Range Comment   Lipase 23  11 - 59 (U/L)   LACTIC ACID, PLASMA     Status: Abnormal   Collection Time   01/31/12 10:37 AM      Component Value Range Comment  Lactic Acid, Venous 3.2 (*) 0.5 - 2.2 (mmol/L)   URINALYSIS, ROUTINE W REFLEX MICROSCOPIC     Status: Abnormal   Collection Time   01/31/12 11:00 AM      Component Value Range Comment   Color, Urine YELLOW  YELLOW     APPearance CLEAR  CLEAR     Specific Gravity, Urine 1.020  1.005 - 1.030     pH 6.0  5.0 - 8.0      Glucose, UA >1000 (*) NEGATIVE (mg/dL)    Hgb urine dipstick NEGATIVE  NEGATIVE     Bilirubin Urine NEGATIVE  NEGATIVE     Ketones, ur NEGATIVE  NEGATIVE (mg/dL)    Protein, ur NEGATIVE  NEGATIVE (mg/dL)    Urobilinogen, UA 0.2  0.0 - 1.0 (mg/dL)    Nitrite POSITIVE (*) NEGATIVE     Leukocytes, UA SMALL (*) NEGATIVE    URINE MICROSCOPIC-ADD ON     Status: Abnormal   Collection Time   01/31/12 11:00 AM      Component Value Range Comment   Squamous Epithelial / LPF FEW (*) RARE     WBC, UA 7-10  <3 (WBC/hpf)    Bacteria, UA MANY (*) RARE    LACTIC ACID, PLASMA     Status: Normal   Collection Time   01/31/12  2:33 PM      Component Value Range Comment   Lactic Acid, Venous 2.2  0.5 - 2.2 (mmol/L)    Ct Abdomen Pelvis W Contrast  01/31/2012  *RADIOLOGY REPORT*  Clinical Data: Abdominal pain, nausea/vomiting, history of breast cancer  CT ABDOMEN AND PELVIS WITH CONTRAST  Technique:  Multidetector CT imaging of the abdomen and pelvis was performed following the standard protocol during bolus administration of intravenous contrast.  Contrast: OMNIPAQUE IOHEXOL 300 MG/ML IJ SOLN  Comparison: [facility] MRI abdomen dated 06/12/2009.  [facility] CT abdomen pelvis dated 01/13/2009.  Findings: Dependent atelectasis in the bilateral lower lobes.  Hepatic steatosis.  Spleen, pancreas, and adrenal glands are within normal limits.  Layering gallstones. No associated inflammatory changes.  No intrahepatic ductal dilatation.  Common duct measures 8 mm, unchanged from 2010.  1.8 cm mildly complex/hemorrhagic left renal cyst (series 2/image 38), grossly unchanged.  Additional tiny bilateral renal cysts.  No hydronephrosis.  No evidence of bowel obstruction.  Normal appendix.  Colonic diverticulosis, without associated inflammatory changes.  Atherosclerotic calcifications of the abdominal aorta and branch vessels.  No abdominopelvic ascites.  No suspicious abdominopelvic lymphadenopathy.  Uterus and bilateral  ovaries are unremarkable.  Bladder is underdistended.  Mild degenerative changes of the visualized thoracolumbar spine, most prominent at L5-S1.  IMPRESSION: Normal appendix.  No evidence of bowel obstruction.  Cholelithiasis, without associated inflammatory changes.  Common duct measures the 8 mm, unchanged from 2010.  Hepatic steatosis.  1.8 cm mildly complex/hemorrhagic left renal cyst, grossly unchanged.  Original Report Authenticated By: Charline Bills, M.D.    Review of Systems  Constitutional: Negative for fever, chills and diaphoresis.  HENT: Negative for ear pain.   Eyes: Negative for double vision and pain.  Respiratory: Negative for cough and shortness of breath.   Cardiovascular: Negative for chest pain.  Gastrointestinal: Positive for nausea, vomiting and abdominal pain. Negative for heartburn, diarrhea, constipation, blood in stool and melena.  Genitourinary: Negative for dysuria and urgency.  Musculoskeletal: Negative for myalgias and back pain.  Neurological: Positive for weakness and headaches. Negative for dizziness, focal weakness and loss of consciousness.    Blood  pressure 145/82, pulse 107, temperature 98.7 F (37.1 C), temperature source Oral, resp. rate 20, weight 147 lb (66.679 kg), SpO2 95.00%. Physical Exam  Constitutional: She appears well-developed.       Active emesis during interview: green/sputum. Patient vomits in bed and upon standing seems to exacerbate.  Lethargy after pain med given, falls asleep during interview.  HENT:  Head: Normocephalic and atraumatic.  Right Ear: External ear normal.  Left Ear: External ear normal.  Mouth/Throat: Oropharynx is clear and moist.  Eyes: EOM are normal. Pupils are equal, round, and reactive to light.       Pupils small but equal and reactive   Neck: Neck supple.       Full cervical ROM, no stiffness.  Cardiovascular: Regular rhythm, normal heart sounds and intact distal pulses.   No murmur heard.      Mild  tachy  Respiratory: Effort normal and breath sounds normal. No respiratory distress. She has no wheezes. She has no rales.  GI: Soft. Bowel sounds are normal. She exhibits no distension and no mass. There is tenderness. There is no rebound and no guarding.       Intermittent guarding and moderate TTP in periumbilical/epigastrium and suprapubic area. No costovertebral TTP. No rebound.  Patient also noted to fall asleep during exam and no guarding or tenderness elicited.   Musculoskeletal: She exhibits no edema and no tenderness.  Lymphadenopathy:    She has no cervical adenopathy.  Neurological: She is alert. No cranial nerve deficit. Coordination normal.       Lethargic but answers questions appropriately. Some spanish and some english. Stands with assistance, general unsteadiness and emesis. 5/5 distal UE and LE strength. Sensation grossly intact. Coordination grossly intact.  Skin: No rash noted.  Psychiatric: She has a normal mood and affect.       Assessment/Plan 56 year old female with history of breast cancer on tamoxifen therapy presents with acute onset abdominal pain with nausea/vomiting and headache.  1. Abdominal pain, n/v. History not entirely consistent but seems to have acute onset last evening with development of emesis today. This is unrelieved with anti-emetics. CT scan revealing cholelithiasis and labs show likely UTI. Additional etiologies perhaps not showing on scan could be gastritis, PUD, gastroparesis. We'll repeat LFTs and lipase, followup physical exam after pain medication allows more patient cooperation. No hemodynamic instability, acute abdomen at this time, and lactate resolved with fluid hydration. If pain continues out of proportion to exam, will consider additional workup including GI consult for EGD, evaluation of the mesenteric vasculature, or gastric emptying study. Continue Zofran when necessary, fluid hydration, start protonix IV twice a day. Patient will be  n.p.o. with active emesis.  2. Headache. Patient describes onset with the vomiting, but unclear which started first. No neurologic signs on exam currently and seems emesis related to abdominal pain. If this worsens or signs develop, consider head CT with history of breast cancer.  3. Hx breast cancer. It appears patient underwent a lumpectomy with chemotherapy and was followed by radiation as well. On chronic tamoxifen, doubt this correlates to nausea vomiting so will continue.  4. DM. Hyperglycemia to 200s. Will hold metformin in setting of elevated lactate. Initiate sliding scale insulin. IV fluid hydration. We'll check A1c, could correlate with gastroparesis.  5. UTI. Mild leukocytosis and nonspecific abdominal pain. Will treat empirically for pyelonephritis and followup urine culture. Continue Rocephin 1 g every 24 hours.  6. Hypertension. Continue home lisinopril/HCTZ. Renal function at baseline.  7. FEN. Mild  tachycardia improved with fluid hydration. 2 L in ED. Will continue IV fluids with normal saline +20 mEq KCl at 150 cc per hour. Borderline hypokalemia will follow up in a.m. as likely ongoing losses through GI.  8. Dispo. Will monitor patient on telemetry. Followup abdominal pain. Pending clinical improvement and workup.  9. PPX. SCDs. Protonix.   Brandi Bryant PGY-2 01/31/2012, 5:29 PM Pager [phone]

## 2012-01-31 NOTE — ED Notes (Addendum)
CDU nurse unable to take report at the time. To call back.

## 2012-01-31 NOTE — Progress Notes (Signed)
Met with patient and her family, discussed her health practices and if she has a physician. Patient states she just saw a doctor about two months ago but she cannot remember his name. Patient denies having any insurance. Patient is interested in meeting with the community liaison to discuss possibility of orange card eligibility. I have made a referral to [facility].

## 2012-01-31 NOTE — ED Notes (Signed)
Patient with onset of headache and n/v last night.

## 2012-01-31 NOTE — ED Provider Notes (Signed)
History     CSN: [id number]  Arrival date & time 01/31/12  4098   First MD Initiated Contact with Patient 01/31/12 1000      Chief Complaint  Patient presents with  . Headache  . Emesis  . Nausea    (Consider location/radiation/quality/duration/timing/severity/associated sxs/prior treatment) HPI History provided by the patient. History mildly limited secondary to pain.   56 year old female with history of hypertension and diabetes presented with complaint of abdominal pain.  Patient states her symptoms began gradually last night, initially with left back pain buttock pain progressing to the central and upper abnormal pain. Patient's pain is described as "pain" without further ability to characterize his pain. No radiation at this time. Patient reports worsening with movement, prone, and in the right lateral decubitus position.  No appreciated alleviating factors. Patient reports associated nausea with multiple episodes of vomiting and severe generalized headache beginning last night after this vomiting, which has persisted; no hematemesis, hematochezia, diarrhea, vaginal complaints, or urinary complaints. Pain is described as severe and constant.  No history of similar symptoms in the past. Patient has not been seen recently for her current complaints. No history of abdominal surgeries.   Past Medical History  Diagnosis Date  . Breast cancer   . HTN (hypertension)   . Diabetes mellitus     Past Surgical History  Procedure Date  . Breast lumpectomy     No family history on file.  History  Substance Use Topics  . Smoking status: Never Smoker   . Smokeless tobacco: Not on file  . Alcohol Use: No    OB History    Grav Para Term Preterm Abortions TAB SAB Ect Mult Living                  Review of Systems  Constitutional: Positive for appetite change. Negative for fever and chills.  HENT: Negative for congestion, sore throat and rhinorrhea.   Eyes: Negative for pain  and visual disturbance.  Respiratory: Negative for cough, shortness of breath and wheezing.   Cardiovascular: Negative for chest pain and palpitations.  Gastrointestinal: Positive for nausea, vomiting and abdominal pain. Negative for diarrhea and blood in stool.  Genitourinary: Negative for dysuria and hematuria.  Musculoskeletal: Negative for back pain and gait problem.  Skin: Negative for rash and wound.  Neurological: Positive for weakness (generally) and headaches. Negative for dizziness.  Psychiatric/Behavioral: Negative for confusion and agitation.  All other systems reviewed and are negative.    Allergies  Iohexol  Home Medications   Current Outpatient Rx  Name Route Sig Dispense Refill  . ACETAMINOPHEN 325 MG PO TABS Oral Take 650 mg by mouth every 6 (six) hours as needed. For headache    . LISINOPRIL-HYDROCHLOROTHIAZIDE 10-12.5 MG PO TABS Oral Take 1 tablet by mouth daily.    Marland Kitchen METFORMIN HCL 500 MG PO TABS Oral Take 500 mg by mouth 2 (two) times daily with a meal.    . TAMOXIFEN CITRATE 20 MG PO TABS Oral Take 20 mg by mouth daily.      BP 170/94  Pulse 123  Temp(Src) 99.3 F (37.4 C) (Oral)  Resp 22  Wt 147 lb (66.679 kg)  SpO2 95%  Physical Exam  Nursing note and vitals reviewed. Constitutional: She is oriented to person, place, and time.       Mildly obese, alert, appears in pain  HENT:  Head: Normocephalic and atraumatic.  Right Ear: External ear normal.  Left Ear: External ear normal.  Nose: Nose normal.  Mouth/Throat: Oropharynx is clear and moist.  Eyes: Conjunctivae and EOM are normal. Pupils are equal, round, and reactive to light.  Neck: Normal range of motion. Neck supple.  Cardiovascular: Regular rhythm and intact distal pulses.  Tachycardia present.   No murmur heard. Pulmonary/Chest: Effort normal and breath sounds normal. No respiratory distress. She has no wheezes. She has no rales.  Abdominal: Soft. Bowel sounds are decreased. There is no  rebound, no guarding and no CVA tenderness.       Obese, diffusely tender, more on the right and in the periumbilical area, positive Murphy sign  Musculoskeletal: Normal range of motion. She exhibits no edema.  Neurological: She is alert and oriented to person, place, and time.  Skin: Skin is warm and dry. No rash noted. She is not diaphoretic.  Psychiatric: She has a normal mood and affect. Judgment normal.    ED Course  Procedures (including critical care time)  Labs Reviewed  CBC - Abnormal; Notable for the following:    Platelets 149 (*)    All other components within normal limits  DIFFERENTIAL - Abnormal; Notable for the following:    Neutrophils Relative 91 (*)    Neutro Abs 9.5 (*)    Lymphocytes Relative 6 (*)    Lymphs Abs 0.6 (*)    Monocytes Relative 2 (*)    All other components within normal limits  COMPREHENSIVE METABOLIC PANEL - Abnormal; Notable for the following:    Glucose, Bld 264 (*)    Albumin 3.4 (*)    AST 39 (*)    All other components within normal limits  URINALYSIS, ROUTINE W REFLEX MICROSCOPIC - Abnormal; Notable for the following:    Glucose, UA >1000 (*)    Nitrite POSITIVE (*)    Leukocytes, UA SMALL (*)    All other components within normal limits  LACTIC ACID, PLASMA - Abnormal; Notable for the following:    Lactic Acid, Venous 3.2 (*)    All other components within normal limits  URINE MICROSCOPIC-ADD ON - Abnormal; Notable for the following:    Squamous Epithelial / LPF FEW (*)    Bacteria, UA MANY (*)    All other components within normal limits  LIPASE, BLOOD  LACTIC ACID, PLASMA   No results found.   1. Abdominal pain   2. UTI (urinary tract infection)       MDM  56 y.o. F with DM, HTN, and no prior abd surgeries presenting with abd pain, N/V since last nt.  HA reportedly after vomiting.  Exam as above, AF, mildly tachy, abdomen moderately tender in the periumbilical to epigastric areas; mild right upper quadrant tenderness  initially with positive Murphy sign, which resolved after pain med.  C/f pancreatitis, acute cholecystitis, colitis, or pyelo.  IV fluids, labs, and CT abdomen ordered. Bedside ultrasound without sign of AAA.    Labs with normal WBC, electrolytes, creatinine, and lipase. Patient with UA concerning for UTI; rocephin given; also, pt with lactic acidosis, possibly secondary to dehydration; NL bicarb and anion gap.  Additional fluids and Reglan given secondary to persistent vomiting and complaint of headache. Patient transferred to the CDU secondary to delay and CT scan. Repeat lactate ordered/pending.      Particia Lather, MD 01/31/12 [phone]

## 2012-01-31 NOTE — ED Notes (Signed)
Pt transported to CT scan.

## 2012-01-31 NOTE — ED Provider Notes (Signed)
Pt presented to ED today w/ abd pain and N/V.  Had left flank pain at onset of sx last night. She has received reglan as well as multiple rounds of zofran and her nausea has not improved.  Abd pain has been relieved w/ IV dilaudid.  On re-examination, uncomfortable appearing, afebrile, mild tachycardia, diffuse abd ttp, worst in epigastrium and RUQ.  CT shows gallstones w/out cholecystitis; otherwise unremarkable.  Sig lab findings include UTI and elevated lactate.  Lactic acid likely secondary to dehydration and it has improved from 3.2 to 2.2 w/ IV fluids.  All results have been discussed w/ patient and her son who is translating.  She has received IV rocephin for UTI and I ordered 1mg  IV ativan for nausea.  Family Practice has been consulted for admission.  I suspect that her sx are secondary to cholelithiasis or pyelo.    Ct Abdomen Pelvis W Contrast  01/31/2012  *RADIOLOGY REPORT*  Clinical Data: Abdominal pain, nausea/vomiting, history of breast cancer  CT ABDOMEN AND PELVIS WITH CONTRAST  Technique:  Multidetector CT imaging of the abdomen and pelvis was performed following the standard protocol during bolus administration of intravenous contrast.  Contrast: OMNIPAQUE IOHEXOL 300 MG/ML IJ SOLN  Comparison: [facility] MRI abdomen dated 06/12/2009.  [facility] CT abdomen pelvis dated 01/13/2009.  Findings: Dependent atelectasis in the bilateral lower lobes.  Hepatic steatosis.  Spleen, pancreas, and adrenal glands are within normal limits.  Layering gallstones. No associated inflammatory changes.  No intrahepatic ductal dilatation.  Common duct measures 8 mm, unchanged from 2010.  1.8 cm mildly complex/hemorrhagic left renal cyst (series 2/image 38), grossly unchanged.  Additional tiny bilateral renal cysts.  No hydronephrosis.  No evidence of bowel obstruction.  Normal appendix.  Colonic diverticulosis, without associated inflammatory changes.  Atherosclerotic calcifications of the abdominal aorta  and branch vessels.  No abdominopelvic ascites.  No suspicious abdominopelvic lymphadenopathy.  Uterus and bilateral ovaries are unremarkable.  Bladder is underdistended.  Mild degenerative changes of the visualized thoracolumbar spine, most prominent at L5-S1.  IMPRESSION: Normal appendix.  No evidence of bowel obstruction.  Cholelithiasis, without associated inflammatory changes.  Common duct measures the 8 mm, unchanged from 2010.  Hepatic steatosis.  1.8 cm mildly complex/hemorrhagic left renal cyst, grossly unchanged.  Original Report Authenticated By: Charline Bills, M.D.    Arie Sabina [place], [place] 01/31/12 1727

## 2012-01-31 NOTE — ED Notes (Signed)
Pt encouraged to continue drinking oral contrast. No active vomiting noted. She remains alert and oriented x4. Vital signs stable.

## 2012-02-01 LAB — HEMOGLOBIN A1C: Mean Plasma Glucose: 169 mg/dL — ABNORMAL HIGH (ref <117)

## 2012-02-01 LAB — CBC
Hemoglobin: 11 g/dL — ABNORMAL LOW (ref 12.0–15.0)
MCHC: 34.9 g/dL (ref 30.0–36.0)
RDW: 13.1 % (ref 11.5–15.5)
WBC: 5.5 10*3/uL (ref 4.0–10.5)

## 2012-02-01 LAB — TSH: TSH: 1.147 u[IU]/mL (ref 0.350–4.500)

## 2012-02-01 LAB — GLUCOSE, CAPILLARY
Glucose-Capillary: 255 mg/dL — ABNORMAL HIGH (ref 70–99)
Glucose-Capillary: 170 mg/dL — ABNORMAL HIGH (ref 70–99)
Glucose-Capillary: 164 mg/dL — ABNORMAL HIGH (ref 70–99)

## 2012-02-01 LAB — BASIC METABOLIC PANEL
GFR calc Af Amer: >90 mL/min (ref >90)
GFR calc non Af Amer: >90 mL/min (ref >90)
Potassium: 3.4 mEq/L — ABNORMAL LOW (ref 3.5–5.1)
Sodium: 141 mEq/L (ref 135–145)

## 2012-02-01 MED ORDER — IBUPROFEN 800 MG PO TABS
800.0000 mg | ORAL_TABLET | Freq: Four times a day (QID) | ORAL | Status: DC | PRN
  Filled 2012-02-01: qty 1

## 2012-02-01 NOTE — Progress Notes (Signed)
Interpreter Wyvonnia Dusky for [employer]

## 2012-02-01 NOTE — ED Provider Notes (Signed)
I saw and evaluated the patient, reviewed the resident's note and I agree with the findings and plan.  Pt with abd pain, N/V.  HA complaint is likely secondary to other symptoms. No focal neuro deficits.  Abd is tender but soft.  Labs, U/S or CT scan.  IVF's and IV meds for symptoms control.  CT abd/pelvis ordered due to diffuse tenderness and continued pain despite initial analgesics ordered.     2:19 PM Spoke to [facility] [place].  Will place in CDU holding for follow up of CT scan and reassessment.    Gavin Pound. Oletta Lamas, MD 02/01/12 [phone]

## 2012-02-01 NOTE — Progress Notes (Signed)
I interviewed and examined this patient and discussed the care plan with Dr. Aviva Signs and the [facility] team and agree with assessment and plan as documented in the progress note for today. I communicated with the patient in Spanish.    Prabhav Faulkenberry A. Sheffield Slider, MD Family Medicine Teaching Service Attending  02/01/2012 1:55 PM

## 2012-02-01 NOTE — ED Provider Notes (Signed)
Medical screening examination/treatment/procedure(s) were performed by non-physician practitioner and as supervising physician I was immediately available for consultation/collaboration.  Please see Dr. Mamie Levers and my note for complete ED course prior to CDU course.  Gavin Pound. Oletta Lamas, MD 02/01/12 [phone]

## 2012-02-01 NOTE — H&P (Signed)
I interviewed her in Spanish and examined this patient and discussed the care plan with Dr. Cristal Ford and the [facility] team and agree with assessment and plan as documented in the admission note for today. I found right CVA tenderness which supports UTI as the cause of her symptoms, however she also describes epigastric and reflux symptoms for which she has been taking Zantac. We will also consider the contribution of her cholelithiasis if follow up labs suggest hepatobiliary involvement   Wilian Kwong A. Sheffield Slider, MD Family Medicine Teaching Service Attending  02/01/2012 2:12 PM

## 2012-02-01 NOTE — Progress Notes (Signed)
Inpatient Diabetes Program Recommendations  AACE/ADA: New Consensus Statement on Inpatient Glycemic Control (2009)  Target Ranges:  Prepandial:   less than 140 mg/dL      Peak postprandial:   less than 180 mg/dL (1-2 hours)      Critically ill patients:  140 - 180 mg/dL     Inpatient Diabetes Program Recommendations Correction (SSI): Please change SSI to Q4 hours if patient will be NPO for prolonged period (currently ordered tid).  Note: Will follow. Ambrose Finland RN, MSN, CDE Diabetes Coordinator Inpatient Diabetes Program [phone]

## 2012-02-01 NOTE — Progress Notes (Signed)
Daily Progress Note Family Medicine Teaching Service PGY-1   Patient name: Brandi Bryant  Medical record [id number] Date of birth:August 30, 1956 Age: 56 y.o. Gender: female  LOS: 1 day   Subjective: Feeling better. Pain is mild. No nausea, vomiting or diarrhea. Mild headache.   Objective: Vitals: Patient Vitals for the past 24 hrs:  BP Temp Pulse Resp SpO2 Height Weight  01/31/12 2107 141/84 mmHg 98.4 F (36.9 C) 97  20  96 % 5\' 1"  (1.549 m) 147 lb 11.2 oz (66.996 kg)   No intake or output data in the 24 hours ending 02/01/12 1322 Physical Exam: Gen:  NAD HEENT: Moist mucous membranes CV: Regular rate and rhythm, no murmurs rubs or gallops PULM: Clear to auscultation bilaterally. No wheezes/rales/rhonchi ABD: Soft,  Tender to palpation of epigastrium and mesogastrium. No distention. Normal bowel sounds EXT: No edema Neuro: Alert and oriented x3. No focalization. No meningeal signs. Labs and imaging:  CBC  Lab 02/01/12 0650 01/31/12 1037  WBC 5.5 10.4  HGB 11.0* 13.8  HCT 31.5* 39.7  PLT 123* 149*   BMET  Lab 02/01/12 0650 01/31/12 2126 01/31/12 1037  NA 141 139 138  K 3.4* 3.4* 3.6  CL 108 104 103  CO2 23 24 22   BUN 11 8 10   CREATININE 0.64 0.54 0.53  LABGLOM -- -- --  GLUCOSE 170* -- --  CALCIUM 7.4* 8.0* 8.8   CBG (last 3)   Basename 02/01/12 1146 02/01/12 0738 01/31/12 2108  GLUCAP 170* 140* 255*   Medications: Scheduled Meds:   . cefTRIAXone (ROCEPHIN)  IV  1 g Intravenous Q24H  . hydrochlorothiazide  12.5 mg Oral Daily  .  HYDROmorphone (DILAUDID) injection  1 mg Intravenous Once  . insulin aspart  0-15 Units Subcutaneous TID WC  . lisinopril  10 mg Oral Daily  . LORazepam  1 mg Intravenous Once  . ondansetron (ZOFRAN) IV  4 mg Intravenous Once  . pantoprazole (PROTONIX) IV  40 mg Intravenous Q12H  . promethazine  12.5 mg Oral Once  . sodium chloride  3 mL Intravenous Q12H  . tamoxifen  20 mg Oral Daily  . DISCONTD:  lisinopril-hydrochlorothiazide  1 tablet Oral Daily  . DISCONTD: tamoxifen  20 mg Oral Daily   Continuous Infusions:   . 0.9 % NaCl with KCl 20 mEq / L 75 mL/hr at 02/01/12 1302   PRN Meds:.acetaminophen, acetaminophen, HYDROmorphone, iohexol, promethazine Assessment and Plan: 56 year old female with history of breast cancer on tamoxifen therapy presents with acute onset abdominal pain with nausea/vomiting and headache.   1. Abdominal pain, n/v. Improved since yesterday.  CT scan revealing cholelithiasis and labs suggestive of UTI. Pt has Hx of GERD and has been taking   -monitoring. -PPI high dose  -Continue Zofran PRN,   2. Headache: stable not worsening, no meningeal signs. -If head ache does not improve will consider CT scan. Pt with Hx of Breast Cancer.  3. Hx breast cancer.  Patient underwent a lumpectomy with chemotherapy and was followed by radiation as well. On chronic tamoxifen, doubt this correlates to nausea vomiting so will continue.   4. DM. -metformin held. -SSI  5. UA consistent with UTI: Mild leukocytosis that has resolved and nonspecific abdominal pain.  -Continue Rocephin 1 g every 24 hours. Possible transition to keflex if pt continues improving.  6. Hypertension.  -Continue home lisinopril/HCTZ. Renal function at baseline.   FEN/GI NS 75 ml/h. Advance diet as tolerated. PPX. SCDs. Protonix Dispo.Pending clinical improvement and workup.  D. [place] Rolene Arbour, MD PGY1, [facility] Medicine Teaching Service Pager [phone] 02/01/2012

## 2012-02-01 NOTE — Progress Notes (Signed)
Clinical Child psychotherapist (CSW) observed that pt is uninsured with a medical history of breast cancer. CSW contacted the financial counselor to inquire whether this pt would receive assistance applying for a Medicaid application. CSW was informed by financial counselor Lia Hopping [phone] that she and the interpretor will be meeting with pt today around 1pm. CSW appreciates financial counselor assisting this pt.No actual referral placed for this CSW however will follow if additional assistance needed.   Theresia Bough, MSW, Theresia Majors [phone]

## 2012-02-02 LAB — LIPID PANEL
Cholesterol: 82 mg/dL (ref 0–200)
HDL: 27 mg/dL — ABNORMAL LOW (ref >39)
Total CHOL/HDL Ratio: 3 RATIO
VLDL: 22 mg/dL (ref 0–40)

## 2012-02-02 LAB — H. PYLORI ANTIBODY, IGG: H Pylori IgG: 0.42 {ISR}

## 2012-02-02 LAB — CBC
HCT: 35 % — ABNORMAL LOW (ref 36.0–46.0)
Hemoglobin: 11.8 g/dL — ABNORMAL LOW (ref 12.0–15.0)
MCHC: 33.7 g/dL (ref 30.0–36.0)

## 2012-02-02 LAB — COMPREHENSIVE METABOLIC PANEL
ALT: 52 U/L — ABNORMAL HIGH (ref 0–35)
AST: 124 U/L — ABNORMAL HIGH (ref 0–37)
Calcium: 8 mg/dL — ABNORMAL LOW (ref 8.4–10.5)
GFR calc Af Amer: >90 mL/min (ref >90)
Glucose, Bld: 125 mg/dL — ABNORMAL HIGH (ref 70–99)
Sodium: 139 mEq/L (ref 135–145)
Total Protein: 5.5 g/dL — ABNORMAL LOW (ref 6.0–8.3)

## 2012-02-02 LAB — GLUCOSE, CAPILLARY

## 2012-02-02 LAB — URINE CULTURE: Special Requests: NORMAL

## 2012-02-02 MED ORDER — CEPHALEXIN 500 MG PO CAPS: 500.0000 mg | ORAL_CAPSULE | Freq: Two times a day (BID) | ORAL | Status: AC

## 2012-02-02 MED ORDER — CEPHALEXIN 500 MG PO CAPS
500.0000 mg | ORAL_CAPSULE | Freq: Two times a day (BID) | ORAL | Status: DC
  Administered 2012-02-02: 500 mg via ORAL
  Filled 2012-02-02 (×2): qty 1

## 2012-02-02 MED ORDER — ONDANSETRON HCL 4 MG PO TABS: 4.0000 mg | ORAL_TABLET | Freq: Three times a day (TID) | ORAL | Status: AC | PRN

## 2012-02-02 NOTE — Discharge Instructions (Signed)
Haga una cita con su doctor en 1-2 semanas para un chequeo y para pruebas.   Si tiene mas dolor de [place] o no puede comer o beber porque esta vomitando, por favor, haga una cita mas pronto con su doctor.   Tome el antibiotico por cinco dias mas, dos veces cada dia. Llene la receta hoy y tome una capsula esta tarde.   Para el dolor de cabeza, bebe mucho agua (6-8 vasos cada dia) y puede tomar acetominophen (Tylenol) 650 mg cada ocho horas si necesita. Si no puede mover un lado de su cuerpo o no puede caminar normal o tiene dificultad con la vista, por favor, vaya al hospital o Event organiser.

## 2012-02-02 NOTE — ED Notes (Signed)
LATE ENTRY FOR THIS PT. PT WAS GIVEN 1 MG OF ATIVAN IV. THE OTHER 1 MG WAS WASTED AND WITNESSED BY Billey Chang RN

## 2012-02-02 NOTE — Discharge Summary (Signed)
I interviewed in Spanish and examined this patient and discussed the care plan with Dr. Madolyn Frieze and the [facility] team and agree with assessment and plan as documented in the discharge note for today.    Brandi Bryant A. Sheffield Slider, MD Family Medicine Teaching Service Attending  02/02/2012 2:21 PM

## 2012-02-02 NOTE — Progress Notes (Signed)
Pt and son provided with discharge instructions. Son was used as an Equities trader to make sure that pt understand instructions. Pt was educated on new medications and how to take them. Pt verbalized understanding of the need to take antibiotic tonight. They plan to pick up medications from [facility] pharmacy after leaving hospital. Pt VSS. No complaints at this time. Denies N, V, SOB, CP. Will continue to monitor this patient until she leaves unit. Ramond Craver, RN

## 2012-02-02 NOTE — Discharge Summary (Signed)
Physician Discharge Summary  Patient ID: Brandi Bryant MRN: [id number] DOB/AGE: 02/22/1956 56 y.o.  Admit date: 01/31/2012 Discharge date: 02/02/2012  Admission Diagnoses: Abdominal pain with nausea and vomiting, concern for pyelonephritis/gastritis/biliary disease  Discharge Diagnoses:  Urinary tract infection, E. coli  Discharged Condition: good  Hospital Course: This is a 56 year old Hispanic female with a history of breast cancer on tamoxifen therapy presenting with acute abdominal pain with nausea/vomiting and headache thought secondary to urinary tract infection.   The patient was admitted and started on ceftriaxone and maintenance IVF. CT-abdomen on admission showed cholelithiasis without cholecystitis, she was afebrile, and her WBC was 10.4. Her abdominal pain and nausea/vomiting had improved by the next morning.  She continued to complain of occasional bitemporal headaches that were alleviated by Tylenol prn. Her headaches started at the same time as her abdominal pain and were attributed to her infection.  H. pylori IgG pending at the time of discharge.  The patient was discharged home with Keflex x 5 days (received 3 days of CTX during hospitalization).  Mildly elevated LFTs The patient had normal LFTs on admission, however, during her hospitalization, her AST jumped from 39 to 130 and her ALT from 30 to 52 on HD#2. The patient was asymptomatic with resolving abdominal pain and no RUQ pain. Her LFTs remained stable on HD#3. Unclear cause of elevated LFTs, however, possibilities include medications (patient started on ceftriaxone), viral infection. TSH was WNL. Hepatitis panel (anti-HAV IgM/HBsAg/HBcAb IgM) and lipid panel pending.   Non-insulin-dependent, type 2 diabetes mellitus Glucose elevated on admission to 250s. Improved control with SSI. Metformin held during hospitalization but patient asked to resume at discharge.  History of breast cancer on tamoxifen at  home Tamoxifen resumed during hospitalization.    Follow-up issues: 1. Repeat CMET to monitor progression of LFTs 2. Follow-up hepatitis labs (anti-HAV IgM/HBsAg/HBcAb IgM), lipid panel 3. Follow-up headaches  4. Consider increasing metformin dosage? HgbA1c 7.5.  5. Follow-up H. pylori IgG   Consults: Social work for assistance applying to [facility]  Discharge Exam: Blood pressure 152/91, pulse 64, temperature 98.6 F (37 C), temperature source Oral, resp. rate 18, height 5\' 1"  (1.549 m), weight 147 lb 11.2 oz (66.996 kg), SpO2 99.00%. Gen: NAD, lying in bed HEENT: MMM, mild strain with patient turns head side to side, PERRL Neuro: CN grossly intact; moves all extremities, no focal deficits Abd: NABS, soft, non-tender including RUQ and suprapubic areas Pulm: NI WOB Ext: no edema  Disposition: discharge to home  Discharge Orders    Future Appointments: Provider: Department: Dept Phone: Center:   02/08/2012 1:00 PM Radene Gunning [facility] [phone] None   02/08/2012 1:30 PM Exie Parody, MD [facility] [phone] None     Medication List  As of 02/02/2012  8:58 AM   ASK your doctor about these medications         acetaminophen 325 MG tablet   Commonly known as: TYLENOL   Take 650 mg by mouth every 6 (six) hours as needed. For headache      escitalopram 10 MG tablet   Commonly known as: LEXAPRO   Take 10 mg by mouth daily.      lisinopril-hydrochlorothiazide 10-12.5 MG per tablet   Commonly known as: PRINZIDE,ZESTORETIC   Take 1 tablet by mouth daily.      metFORMIN 500 MG tablet   Commonly known as: GLUCOPHAGE   Take 500 mg by mouth 2 (two) times daily with a meal.      tamoxifen  20 MG tablet   Commonly known as: NOLVADEX   Take 20 mg by mouth daily.           Follow-up Information    Follow up with Pcp Not In System .         Signed: OH PARK, Sriyan Cutting 02/02/2012, 8:58 AM

## 2012-02-03 LAB — HEPATITIS A ANTIBODY, IGM: Hep A IgM: NEGATIVE

## 2012-02-08 ENCOUNTER — Telehealth (INDEPENDENT_AMBULATORY_CARE_PROVIDER_SITE_OTHER): Payer: Self-pay | Admitting: General Surgery

## 2012-02-08 ENCOUNTER — Ambulatory Visit: Payer: Self-pay | Admitting: Oncology

## 2012-02-08 ENCOUNTER — Other Ambulatory Visit: Payer: Self-pay | Admitting: Lab

## 2012-02-27 ENCOUNTER — Other Ambulatory Visit: Payer: Self-pay | Admitting: *Deleted

## 2012-02-27 MED ORDER — TAMOXIFEN CITRATE 20 MG PO TABS: 20.0000 mg | ORAL_TABLET | Freq: Every day | ORAL | Status: DC

## 2012-03-01 ENCOUNTER — Encounter (INDEPENDENT_AMBULATORY_CARE_PROVIDER_SITE_OTHER): Payer: Self-pay | Admitting: General Surgery

## 2012-03-29 ENCOUNTER — Encounter (INDEPENDENT_AMBULATORY_CARE_PROVIDER_SITE_OTHER): Payer: Self-pay | Admitting: General Surgery

## 2012-04-30 ENCOUNTER — Encounter (INDEPENDENT_AMBULATORY_CARE_PROVIDER_SITE_OTHER): Payer: Self-pay | Admitting: General Surgery

## 2012-05-09 ENCOUNTER — Encounter (INDEPENDENT_AMBULATORY_CARE_PROVIDER_SITE_OTHER): Payer: Self-pay | Admitting: General Surgery

## 2012-05-14 ENCOUNTER — Telehealth (INDEPENDENT_AMBULATORY_CARE_PROVIDER_SITE_OTHER): Payer: Self-pay | Admitting: General Surgery

## 2012-05-14 ENCOUNTER — Encounter (INDEPENDENT_AMBULATORY_CARE_PROVIDER_SITE_OTHER): Payer: Self-pay | Admitting: General Surgery

## 2012-05-14 NOTE — Telephone Encounter (Signed)
Tried to contact patient again with regard to her missing her appointment today with Dr. Derrell Lolling. No answer, will try again tomorrow.

## 2012-05-14 NOTE — Telephone Encounter (Signed)
Called patient cell left message to call back our office with regard to appointment missed today, appointment will need to be rescheduled.

## 2012-05-15 ENCOUNTER — Encounter (INDEPENDENT_AMBULATORY_CARE_PROVIDER_SITE_OTHER): Payer: Self-pay | Admitting: General Surgery

## 2012-05-29 ENCOUNTER — Telehealth (INDEPENDENT_AMBULATORY_CARE_PROVIDER_SITE_OTHER): Payer: Self-pay | Admitting: General Surgery

## 2012-05-29 ENCOUNTER — Encounter (INDEPENDENT_AMBULATORY_CARE_PROVIDER_SITE_OTHER): Payer: Self-pay | Admitting: General Surgery

## 2012-05-29 NOTE — Progress Notes (Unsigned)
Per Dr. Jacinto Halim instructions, do not schedule this patient to see him in the future. This patient was given four individual appointments in which she did not show up for two and cancelled the other two.

## 2012-06-01 ENCOUNTER — Telehealth: Payer: Self-pay | Admitting: Oncology

## 2012-06-01 ENCOUNTER — Encounter: Payer: Self-pay | Admitting: Oncology

## 2012-06-01 ENCOUNTER — Other Ambulatory Visit: Payer: Self-pay | Admitting: Oncology

## 2012-06-01 DIAGNOSIS — Z853 Personal history of malignant neoplasm of breast: Secondary | ICD-10-CM

## 2012-06-01 HISTORY — DX: Personal history of malignant neoplasm of breast: Z85.3

## 2012-06-01 NOTE — Telephone Encounter (Signed)
lmonvm adviisng the pt of her mammo appt in July and her md appt in aug

## 2012-06-28 ENCOUNTER — Other Ambulatory Visit: Payer: Self-pay | Admitting: Lab

## 2012-06-28 ENCOUNTER — Ambulatory Visit: Payer: Self-pay | Admitting: Oncology

## 2012-06-28 ENCOUNTER — Telehealth: Payer: Self-pay | Admitting: Oncology

## 2012-06-28 NOTE — Progress Notes (Signed)
No show (2nd time).  POF to scheduler to reschedule.

## 2012-06-28 NOTE — Telephone Encounter (Signed)
called pt with new appt aas well as mailed to her    aom

## 2012-07-31 ENCOUNTER — Encounter: Payer: Self-pay | Admitting: Internal Medicine

## 2012-07-31 ENCOUNTER — Encounter: Payer: Self-pay | Admitting: Oncology

## 2012-07-31 ENCOUNTER — Other Ambulatory Visit (HOSPITAL_BASED_OUTPATIENT_CLINIC_OR_DEPARTMENT_OTHER): Payer: Self-pay | Admitting: Lab

## 2012-07-31 ENCOUNTER — Ambulatory Visit (HOSPITAL_BASED_OUTPATIENT_CLINIC_OR_DEPARTMENT_OTHER): Payer: Self-pay | Admitting: Oncology

## 2012-07-31 ENCOUNTER — Telehealth: Payer: Self-pay | Admitting: Oncology

## 2012-07-31 VITALS — BP 141/88 | HR 69 | Temp 97.5°F | Resp 20 | Ht 61.0 in | Wt 146.1 lb

## 2012-07-31 DIAGNOSIS — Z853 Personal history of malignant neoplasm of breast: Secondary | ICD-10-CM

## 2012-07-31 DIAGNOSIS — Z170 Estrogen receptor positive status [ER+]: Secondary | ICD-10-CM

## 2012-07-31 DIAGNOSIS — R1013 Epigastric pain: Secondary | ICD-10-CM

## 2012-07-31 DIAGNOSIS — I10 Essential (primary) hypertension: Secondary | ICD-10-CM

## 2012-07-31 DIAGNOSIS — C50219 Malignant neoplasm of upper-inner quadrant of unspecified female breast: Secondary | ICD-10-CM

## 2012-07-31 LAB — COMPREHENSIVE METABOLIC PANEL (CC13)
ALT: 34 U/L (ref 0–55)
BUN: 8 mg/dL (ref 7.0–26.0)
CO2: 22 mEq/L (ref 22–29)
Calcium: 9.1 mg/dL (ref 8.4–10.4)
Chloride: 106 mEq/L (ref 98–107)
Creatinine: 0.8 mg/dL (ref 0.6–1.1)
Glucose: 212 mg/dl — ABNORMAL HIGH (ref 70–99)

## 2012-07-31 LAB — CBC WITH DIFFERENTIAL/PLATELET
Basophils Absolute: 0 10*3/uL (ref 0.0–0.1)
HCT: 36.2 % (ref 34.8–46.6)
HGB: 12.6 g/dL (ref 11.6–15.9)
LYMPH%: 32.2 % (ref 14.0–49.7)
MONO#: 0.5 10*3/uL (ref 0.1–0.9)
NEUT%: 52.4 % (ref 38.4–76.8)
Platelets: 134 10*3/uL — ABNORMAL LOW (ref 145–400)
WBC: 5.2 10*3/uL (ref 3.9–10.3)
lymph#: 1.7 10*3/uL (ref 0.9–3.3)

## 2012-07-31 MED ORDER — TAMOXIFEN CITRATE 20 MG PO TABS: 20.0000 mg | ORAL_TABLET | Freq: Every day | ORAL | Status: DC

## 2012-07-31 NOTE — Progress Notes (Signed)
Pt came in today wanting to apply for Medicaid.  Per social worker notes on 01/31/12 pt talked w/ Morrie Sheldon [phone]) financial advocate at the hospital, based on her notes no Medicaid app was done.  We spoke w/ Lynden Ang at [facility], pt acct was in collections w/ the hospital.  We gave her a Medicaid app advised her to return it to DSS.  We also gave financial asst app and advised pt to return w/ letter of support from daughter since she doesn't have any income at this time.

## 2012-07-31 NOTE — Patient Instructions (Addendum)
Continue Tamoxifen 20 mg daily.  Schedule mammogram for this month.  Follow-up in 6 months.  I have made a referral to Gastroenterology to evaluate your stomach pain.

## 2012-07-31 NOTE — Telephone Encounter (Signed)
Gave pt appt calendar for GI appt @  and [facility]ogram [facility],gave pt appt with MD on March 2014 with labs

## 2012-07-31 NOTE — Progress Notes (Signed)
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 She received adjuvant radiation therapy.  CURRENT THERAPY: Adjuvant hormonal therapy with tamoxifen started in August 2010.  INTERVAL HISTORY: Brandi Bryant 56 y.o. female returns for routine followup with her daughter. It is been about a year since we've last seen the patient as she missed multiple appointments with [place]. She continues to take tamoxifen 20 mg daily without any major issues. She does have some mild hot flashes. Denies night sweats, vaginal discharge, vaginal bleeding. She has intermittent left breast/chest wall discomfort. This is been an ongoing issue for her past workup has included a CT scan which not show any abnormality. She continues to have a lot of dyspepsia and has taken Prilosec in the past but states that it did not work. She states when she eats or hurts more. She has not had any weight loss. Denies epistaxis, hemoptysis, hematemesis, melena, hematuria. No nausea or vomiting. She has not noticed any new lumps or bumps in her breast. Patient's last mammogram was performed in December 2012. A six-month followup was recommended to ensure stability but the patient did not return for this appointment. No chest pain, shortness of breath, dyspnea.  Past Medical History  Diagnosis Date  . Breast cancer   . HTN (hypertension)   . Diabetes mellitus   . Cholelithiasis   . Hepatic steatosis   . Abdominal pain   . History of breast cancer 06/01/2012    Past Surgical History  Procedure Date  . Breast lumpectomy   . Tubal ligation     Current Outpatient  Prescriptions  Medication Sig Dispense Refill  . acetaminophen (TYLENOL) 325 MG tablet Take 650 mg by mouth every 6 (six) hours as needed. For headache      . lisinopril-hydrochlorothiazide (PRINZIDE,ZESTORETIC) 10-12.5 MG per tablet Take 1 tablet by mouth daily.      . meperidine (DEMEROL) 50 MG tablet Take 50 mg by mouth every 6 (six) hours as needed.      . metFORMIN (GLUCOPHAGE) 500 MG tablet Take 500 mg by mouth 2 (two) times daily with a meal.      . tamoxifen (NOLVADEX) 20 MG tablet Take 1 tablet (20 mg total) by mouth daily.  30 tablet  6  . DISCONTD: escitalopram (LEXAPRO) 10 MG tablet Take 10 mg by mouth daily.        ALLERGIES:  is allergic to gadolinium derivatives.  REVIEW OF SYSTEMS:  The rest of the 14-point review of system was negative.   Filed Vitals:   07/31/12 1042  BP: 141/88  Pulse: 69  Temp: 97.5 F (36.4 C)  Resp: 20   Wt Readings from Last 3 Encounters:  07/31/12 146 lb 1.6 oz (66.271 kg)  01/31/12 147 lb 11.2 oz (66.996 kg)  06/23/11 139 lb 11.2 oz (63.368 kg)   ECOG Performance status: 1  PHYSICAL EXAMINATION:   General:  well-nourished in no acute distress.  Eyes:  no scleral icterus.  ENT:  There were no oropharyngeal lesions.  Neck was without thyromegaly.  Lymphatics:  Negative cervical, supraclavicular or axillary adenopathy.  Respiratory: lungs were clear bilaterally without wheezing or crackles.  Cardiovascular:  Regular rate and rhythm, S1/S2, without murmur, rub or gallop.  There was no pedal edema.  GI:  abdomen was soft, flat, nontender, nondistended, without organomegaly.  Muscoloskeletal:  no spinal tenderness of palpation of vertebral spine.  Skin exam was without echymosis, petichae.  Neuro exam was nonfocal.  Patient was able to get on and off exam table without assistance.  Gait was normal.  Patient was alerted and oriented.  Attention was good.   Language was appropriate.  Mood was normal without depression.  Speech was not pressured.   Thought content was not tangential.  Bilateral breast exam showed negative right breast findings. Left breast exam she lumpectomy scar that is well-healed without Department discharge or erythema. There is mild pain to palpation of the anterior left chest without palpable mass.  LABORATORY/RADIOLOGY DATA:  Lab Results  Component Value Date   WBC 5.2 07/31/2012   HGB 12.6 07/31/2012   HCT 36.2 07/31/2012   PLT 134* 07/31/2012   GLUCOSE 212* 07/31/2012   CHOL 82 02/02/2012   TRIG 110 02/02/2012   HDL 27* 02/02/2012   LDLCALC 33 02/02/2012   ALKPHOS 106 07/31/2012   ALT 34 07/31/2012   AST 44* 07/31/2012   NA 139 07/31/2012   K 4.2 07/31/2012   CL 106 07/31/2012   CREATININE 0.8 07/31/2012   BUN 8.0 07/31/2012   CO2 22 07/31/2012   HGBA1C 7.5* 01/31/2012   MICROALBUR 1.46 09/27/2008    ASSESSMENT AND PLAN:   1. History of breast cancer: The patient has no evidence of disease recurrence on history, physical exam, and laboratory tests. Her intermittent left chest wall/breast pain is most likely due to being status post lumpectomy advised her to continue tamoxifen 20 mg daily for a total of 5 years. Her last mammogram was in December 2012. A 6 month followup was recommended, but the patient did not go this appointment. I have scheduled a diagnostic mammogram for the patient today to be done within the next month.  2. Left chest wall pain, unclear etiology: Does most likely musculoskeletal as workup in the past with mammogram and CT of the chest were negative. Her pain has been stable will continue to watch this.  3. Midepigastric pain: Likely gastroesophageal reflux disease or peptic ulcer disease. Patient has declined to take Prilosec as she stated did not work for her in the past. Also, she is over 50 he never had a colonoscopy. I have referred her to a gastroenterologist for further evaluation of her abdominal pain and for possible colon cancer screening.  4. Hypertension: The patient is on lisinopril  per PCP.  5. Type 2 diabetes mellitus: She remains on metformin per PCP.  6. Followup the patient is scheduled for routine followup in 6 months time.      The length of time of the face-to-face encounter was 30 minutes. More than 50% of time was spent counseling and coordination of care.

## 2012-08-02 ENCOUNTER — Ambulatory Visit
Admission: RE | Admit: 2012-08-02 | Discharge: 2012-08-02 | Disposition: A | Discharge status: Home / Self Care | Payer: Self-pay | Source: Ambulatory Visit | Attending: Oncology | Admitting: Oncology

## 2012-08-02 DIAGNOSIS — Z853 Personal history of malignant neoplasm of breast: Secondary | ICD-10-CM

## 2012-08-27 ENCOUNTER — Encounter: Payer: Self-pay | Admitting: Internal Medicine

## 2012-08-28 ENCOUNTER — Ambulatory Visit: Payer: Self-pay | Admitting: Internal Medicine

## 2013-01-28 ENCOUNTER — Other Ambulatory Visit: Payer: Self-pay

## 2013-01-28 ENCOUNTER — Telehealth: Payer: Self-pay | Admitting: Oncology

## 2013-01-28 ENCOUNTER — Other Ambulatory Visit (HOSPITAL_BASED_OUTPATIENT_CLINIC_OR_DEPARTMENT_OTHER): Payer: Self-pay | Admitting: Lab

## 2013-01-28 ENCOUNTER — Ambulatory Visit (HOSPITAL_BASED_OUTPATIENT_CLINIC_OR_DEPARTMENT_OTHER): Payer: Self-pay | Admitting: Oncology

## 2013-01-28 VITALS — BP 134/75 | HR 66 | Temp 97.8°F | Resp 18 | Ht 61.0 in | Wt 138.3 lb

## 2013-01-28 DIAGNOSIS — C50219 Malignant neoplasm of upper-inner quadrant of unspecified female breast: Secondary | ICD-10-CM

## 2013-01-28 DIAGNOSIS — Z853 Personal history of malignant neoplasm of breast: Secondary | ICD-10-CM

## 2013-01-28 LAB — COMPREHENSIVE METABOLIC PANEL (CC13)
AST: 28 U/L (ref 5–34)
Albumin: 3.3 g/dL — ABNORMAL LOW (ref 3.5–5.0)
Alkaline Phosphatase: 85 U/L (ref 40–150)
BUN: 11.9 mg/dL (ref 7.0–26.0)
Glucose: 243 mg/dl — ABNORMAL HIGH (ref 70–99)
Potassium: 4.3 mEq/L (ref 3.5–5.1)
Sodium: 140 mEq/L (ref 136–145)
Total Bilirubin: 0.75 mg/dL (ref 0.20–1.20)

## 2013-01-28 LAB — CBC WITH DIFFERENTIAL/PLATELET
Basophils Absolute: 0 10*3/uL (ref 0.0–0.1)
EOS%: 5.1 % (ref 0.0–7.0)
HCT: 36.9 % (ref 34.8–46.6)
HGB: 12.8 g/dL (ref 11.6–15.9)
LYMPH%: 29.6 % (ref 14.0–49.7)
MONO%: 8.7 % (ref 0.0–14.0)
NEUT#: 3.2 10*3/uL (ref 1.5–6.5)
RBC: 4.12 10*6/uL (ref 3.70–5.45)
RDW: 12.6 % (ref 11.2–14.5)
WBC: 5.7 10*3/uL (ref 3.9–10.3)

## 2013-01-28 MED ORDER — LETROZOLE 2.5 MG PO TABS: 2.5000 mg | ORAL_TABLET | Freq: Every day | ORAL | Status: DC

## 2013-01-28 NOTE — Telephone Encounter (Signed)
Gave pt appt for lab and ML on September 2014 and Bone scan [facility] and pt will pay on day of appt 02/05/13

## 2013-01-28 NOTE — Progress Notes (Signed)
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 She received adjuvant radiation therapy.   CURRENT THERAPY: Adjuvant hormonal therapy with tamoxifen started in August 2010.   INTERVAL HISTORY: Brandi Bryant 57 y.o. female returns for regular follow up.  She reported that she has mild hot flash during morning, but not night.  She denied calf/thigh pain or swelling.  She denied vaginal bleeding.  Her last mentrual period was about 5 years ago. She still has intermittent midepigastric pain and has been working with her PCP with this.  Abdominal pain is unpredictable.  There is no sure relation to eating or bowel movement and her abdominal pain. She still works full time at house cleaning.  She has mild fatigue coming home to work at night.  She has intermittent left breast pain at the site of lumpectomy without palpable mass.   Patient denies fever, anorexia, weight loss, headache, visual changes, confusion, drenching night sweats, palpable lymph node swelling, mucositis, odynophagia, dysphagia, nausea vomiting, jaundice, chest pain, palpitation, shortness of breath, dyspnea on exertion, productive cough, gum bleeding, epistaxis, hematemesis, hemoptysis, abdominal swelling, early satiety, melena, hematochezia, hematuria, skin rash, spontaneous bleeding, joint swelling, joint pain, heat or cold intolerance, bowel bladder incontinence, back pain, focal motor weakness, paresthesia, depression.     Past Medical History  Diagnosis Date  . Breast cancer   . HTN (hypertension)   . Diabetes mellitus   . Cholelithiasis   . Hepatic steatosis    . Abdominal pain   . History of breast cancer 06/01/2012    Past Surgical History  Procedure Laterality Date  . Breast lumpectomy    . Tubal ligation      Current Outpatient Prescriptions  Medication Sig Dispense Refill  . acetaminophen (TYLENOL) 325 MG tablet Take 650 mg by mouth every 6 (six) hours as needed. For headache      . lisinopril-hydrochlorothiazide (PRINZIDE,ZESTORETIC) 10-12.5 MG per tablet Take 1 tablet by mouth daily.      . meperidine (DEMEROL) 50 MG tablet Take 50 mg by mouth every 6 (six) hours as needed.      . metFORMIN (GLUCOPHAGE) 500 MG tablet Take 500 mg by mouth 2 (two) times daily with a meal.      . Multiple Vitamins-Minerals (MULTIVITAMIN WITH MINERALS) tablet Take 1 tablet by mouth daily.      . tamoxifen (NOLVADEX) 20 MG tablet Take 1 tablet (20 mg total) by mouth daily.  30 tablet  6  . [DISCONTINUED] escitalopram (LEXAPRO) 10 MG tablet Take 10 mg by mouth daily.       No current facility-administered medications for this visit.    ALLERGIES:  is allergic to gadolinium derivatives.  REVIEW OF SYSTEMS:  The rest of the 14-point review of system was negative.   Filed Vitals:   01/28/13 0906  BP: 134/75  Pulse: 66  Temp: 97.8 F (36.6 C)  Resp: 18   Wt Readings from Last 3 Encounters:  01/28/13 138 lb 4.8 oz (62.732 kg)  07/31/12 146 lb 1.6 oz (66.271 kg)  01/31/12 147 lb 11.2 oz (66.996 kg)   ECOG Performance status: 0-1  PHYSICAL EXAMINATION:  General:  well-nourished woman, in no acute distress.  Eyes:  no scleral icterus.  ENT:  There were no oropharyngeal lesions.  Neck was without thyromegaly.  Lymphatics:  Negative cervical, supraclavicular or axillary adenopathy.  Respiratory: lungs were clear bilaterally without wheezing or crackles.  Cardiovascular:  Regular rate and rhythm, S1/S2, without murmur, rub or gallop.  There was no pedal edema.  GI:  abdomen was soft, flat, nontender, nondistended, without organomegaly.  Muscoloskeletal:   no spinal tenderness of palpation of vertebral spine.  Skin exam was without echymosis, petichae.  Neuro exam was nonfocal.  Patient was able to get on and off exam table without assistance.  Gait was normal.  Patient was alerted and oriented.  Attention was good.   Language was appropriate.  Mood was normal without depression.  Speech was not pressured.  Thought content was not tangential.  Bilateral breast exam showed well healed peri areolar scar in the left breast.  Bilateral breast exam was negative for palpable mass, skin thickening, purulent discharge.    LABORATORY/RADIOLOGY DATA:  Lab Results  Component Value Date   WBC 5.7 01/28/2013   HGB 12.8 01/28/2013   HCT 36.9 01/28/2013   PLT 132* 01/28/2013   GLUCOSE 243* 01/28/2013   CHOL 82 02/02/2012   TRIG 110 02/02/2012   HDL 27* 02/02/2012   LDLCALC 33 02/02/2012   ALKPHOS 85 01/28/2013   ALT 23 01/28/2013   AST 28 01/28/2013   NA 140 01/28/2013   K 4.3 01/28/2013   CL 107 01/28/2013   CREATININE 0.8 01/28/2013   BUN 11.9 01/28/2013   CO2 25 01/28/2013   HGBA1C 7.5* 01/31/2012   MICROALBUR 1.46 09/27/2008    ASSESSMENT AND PLAN:    1. History of breast cancer:  She is in remission.  Her mammogram in 07/2012 were negative.  As she has had three years of tamoxifen, I recommended switching to an aromatase inhibitor since she is now monopause.  With her low PR%, an AI is slightly better than Tamoxifen in decreasing risk of recurrence. I recommend at least 2 years of an AI, and maybe 5 if she has no major side effects.  I advised her potential side effects of aromatase inhibitors which include but not limited to hot flash, night sweat, bone/muscle pain, osteoporosis.  She expressed informed understanding and wished to go ahead with the change.  I [facility] with [facility].  If she has severe osteoporosis, we may stick with tamoxifen.  If she has osteopenia or mild osteoporosis, we may proceed with the switch and start  Prolia. I wrote Femara 2.5mg  PO daily.   2. Midepigastric pain: extensive work up in the past including CT were negative.  There was no sign of met. I advised her to follow up with her PCP.   3. Hypertension: The patient is on lisinopril/HCTZ per PCP.   4. Type 2 diabetes mellitus: She is on metformin per PCP.   5. Follow up:  In about 6 months.  She is due to surveillance mammogram in about 07/2013.      The length of time of the face-to-face encounter was 25 minutes. More than 50% of time was spent counseling and coordination of care.

## 2013-02-05 ENCOUNTER — Ambulatory Visit
Admission: RE | Admit: 2013-02-05 | Discharge: 2013-02-05 | Disposition: A | Discharge status: Home / Self Care | Payer: No Typology Code available for payment source | Source: Ambulatory Visit | Attending: Oncology | Admitting: Oncology

## 2013-02-05 DIAGNOSIS — Z853 Personal history of malignant neoplasm of breast: Secondary | ICD-10-CM

## 2013-02-19 ENCOUNTER — Telehealth: Payer: Self-pay | Admitting: *Deleted

## 2013-02-19 NOTE — Telephone Encounter (Signed)
Daughter called to report pt tried to get Letrozole filled at [facility] and it costs $250 for month supply.  Pt cannot afford this medication at this time.  Informed dau I will call [facility] out pt pharmacy to check on price.  Called [facility] outpt and s/w Arlys John.  They can help pt and fill 30 day supply for $9.00.   Called in Rx for Letrozole to [facility] [place] at [place].   Called dau back and left her detailed VM informing of med being filled at [facility] outpt pharm for $9.  Gave her phone number and hours for pharmacy.  Asked her to call us back if any further questions.

## 2013-05-13 ENCOUNTER — Telehealth: Payer: Self-pay | Admitting: Oncology

## 2013-06-04 ENCOUNTER — Telehealth: Payer: Self-pay

## 2013-06-04 NOTE — Telephone Encounter (Signed)
G

## 2013-06-07 ENCOUNTER — Telehealth: Payer: Self-pay

## 2013-06-07 NOTE — Telephone Encounter (Signed)
Patient's daughter states she called and left a message 2 days ago about getting her mother's prescription refilled. States that her mother is completely out of her lisinopril. Patient is also currently out of town and would like to pick this up from a  [employer] in [place].  [phone]

## 2013-06-07 NOTE — Telephone Encounter (Signed)
Can not do this without visit.

## 2013-06-09 ENCOUNTER — Telehealth: Payer: Self-pay

## 2013-06-09 NOTE — Telephone Encounter (Signed)
See previous phone message. 

## 2013-06-09 NOTE — Telephone Encounter (Signed)
Pt is a pt of Dr. Quintella Reichert next door.

## 2013-06-09 NOTE — Telephone Encounter (Signed)
No answer no vm

## 2013-06-09 NOTE — Telephone Encounter (Signed)
(  Spanish Speaking only)   Patient called to ask for refills on her medication( medmosil? & lisinopril) . She does not understand why she needs an office visit- wants to know why specifically. She says she is in [place] and can not come in. She speaks only spanish. See previous message also.   Best number: [phone]

## 2013-07-26 ENCOUNTER — Telehealth: Payer: Self-pay | Admitting: Hematology and Oncology

## 2013-07-26 NOTE — Telephone Encounter (Signed)
Moved 9/10 appt to 9/23 w/NG. S/w pt re new d/t for lb/NG 9/23.

## 2013-07-31 ENCOUNTER — Ambulatory Visit: Payer: Self-pay | Admitting: Oncology

## 2013-07-31 ENCOUNTER — Other Ambulatory Visit: Payer: Self-pay | Admitting: Lab

## 2013-08-13 ENCOUNTER — Encounter: Payer: Self-pay | Admitting: Hematology and Oncology

## 2013-08-13 ENCOUNTER — Ambulatory Visit (HOSPITAL_BASED_OUTPATIENT_CLINIC_OR_DEPARTMENT_OTHER): Payer: Self-pay | Admitting: Hematology and Oncology

## 2013-08-13 ENCOUNTER — Other Ambulatory Visit: Payer: No Typology Code available for payment source | Admitting: Lab

## 2013-08-13 ENCOUNTER — Telehealth: Payer: Self-pay | Admitting: Hematology and Oncology

## 2013-08-13 VITALS — BP 151/71 | HR 69 | Temp 97.8°F | Resp 20 | Ht 61.0 in | Wt 147.1 lb

## 2013-08-13 DIAGNOSIS — C50219 Malignant neoplasm of upper-inner quadrant of unspecified female breast: Secondary | ICD-10-CM

## 2013-08-13 DIAGNOSIS — Z853 Personal history of malignant neoplasm of breast: Secondary | ICD-10-CM

## 2013-08-13 DIAGNOSIS — M2550 Pain in unspecified joint: Secondary | ICD-10-CM

## 2013-08-13 DIAGNOSIS — Z170 Estrogen receptor positive status [ER+]: Secondary | ICD-10-CM

## 2013-08-13 HISTORY — DX: Pain in unspecified joint: M25.50

## 2013-08-13 MED ORDER — TRAMADOL HCL 50 MG PO TABS: 50.0000 mg | ORAL_TABLET | Freq: Three times a day (TID) | ORAL | Status: DC | PRN

## 2013-08-13 MED ORDER — LETROZOLE 2.5 MG PO TABS: 2.5000 mg | ORAL_TABLET | Freq: Every day | ORAL | Status: DC

## 2013-08-13 NOTE — Progress Notes (Signed)
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 She received adjuvant radiation therapy. She was on adjuvant hormonal therapy with tamoxifen started in August 2010. In March 2014: her treatment is switched to Femara.  INTERVAL HISTORY: Brandi Bryant 57 y.o. female returns for a routine visit. She has not had her mammogram this year. She denies any new palpable lumps or bumps. She was switched to Femara when she saw her oncologist 6 months ago. She had very mild hot flashes. Her major complaint is musculoskeletal pain while on Femara. She denies any new lumps or bumps in the breasts. She complained of chest wall discomfort but that is not new since her last surgery.  I have reviewed the past medical history, past surgical history, social history and family history with the patient and they are unchanged from previous note.  ALLERGIES:  is allergic to gadolinium derivatives.  MEDICATIONS: has a current medication list which includes the following prescription(s): ibuprofen, letrozole, lisinopril-hydrochlorothiazide, meperidine, metformin, multivitamin with minerals, acetaminophen, tamoxifen, and tramadol.  REVIEW OF SYSTEMS:   Constitutional: Denies fevers, chills or abnormal weight loss Eyes: Denies blurriness of vision Ears, nose, mouth, throat, and face: Denies mucositis or sore throat Respiratory: Denies cough, dyspnea or wheezes Cardiovascular: Denies palpitation, chest discomfort or lower extremity swelling Gastrointestinal:  Denies nausea, heartburn or change in bowel habits Skin: Denies abnormal skin rashes Lymphatics: Denies new lymphadenopathy or easy  bruising Neurological:Denies numbness, tingling or new weaknesses Behavioral/Psych: Mood is stable, no new changes  All other systems were reviewed with the patient and are negative.  PHYSICAL EXAMINATION: ECOG PERFORMANCE STATUS: 1 - Symptomatic but completely ambulatory  Filed Vitals:   08/13/13 0919  BP: 151/71  Pulse: 69  Temp: 97.8 F (36.6 C)  Resp: 20    GENERAL:alert, no distress and comfortable SKIN: skin color, texture, turgor are normal, no rashes or significant lesions EYES: normal, Conjunctiva are pink and non-injected, sclera clear OROPHARYNX:no exudate, no erythema and lips, buccal mucosa, and tongue normal  NECK: supple, thyroid normal size, non-tender, without nodularity LYMPH:  no palpable lymphadenopathy in the cervical, axillary or inguinal LUNGS: clear to auscultation and percussion with normal breathing effort HEART: regular rate & rhythm and no murmurs and no lower extremity edema ABDOMEN:abdomen soft, non-tender and normal bowel sounds Musculoskeletal:no cyanosis of digits and no clubbing  NEURO: alert & oriented x 3 with fluent speech, no focal motor/sensory deficits Breast exam: She has a well-healed lumpectomy scar on the left. There is very mild tenderness along the surgical scar. No new lumps or bumps. Bilateral nipples were normal.  LABORATORY DATA:  I have reviewed the data as listed    Component Value Date/Time   NA 140 01/28/2013 0854   NA 139 02/02/2012 0600   K 4.3 01/28/2013 0854   K 3.7 02/02/2012 0600   CL 107 01/28/2013 0854   CL 110 02/02/2012 0600   CO2 25 01/28/2013 0854   CO2 25 02/02/2012 0600   GLUCOSE 243* 01/28/2013 0854   GLUCOSE 125* 02/02/2012 0600   BUN 11.9 01/28/2013 0854   BUN 7 02/02/2012 0600   CREATININE 0.8 01/28/2013 0854   CREATININE 0.56 02/02/2012 0600   CALCIUM 9.0 01/28/2013 0854  CALCIUM 8.0* 02/02/2012 0600   PROT 6.6 01/28/2013 0854   PROT 5.5* 02/02/2012 0600   ALBUMIN 3.3* 01/28/2013 0854   ALBUMIN 2.7* 02/02/2012  0600   AST 28 01/28/2013 0854   AST 124* 02/02/2012 0600   ALT 23 01/28/2013 0854   ALT 52* 02/02/2012 0600   ALKPHOS 85 01/28/2013 0854   ALKPHOS 58 02/02/2012 0600   BILITOT 0.75 01/28/2013 0854   BILITOT 0.4 02/02/2012 0600   GFRNONAA >90 02/02/2012 0600   GFRAA >90 02/02/2012 0600    I No results found for this basename: SPEP, UPEP,  kappa and lambda light chains    Lab Results  Component Value Date   WBC 5.7 01/28/2013   NEUTROABS 3.2 01/28/2013   HGB 12.8 01/28/2013   HCT 36.9 01/28/2013   MCV 89.6 01/28/2013   PLT 132* 01/28/2013      Chemistry      Component Value Date/Time   NA 140 01/28/2013 0854   NA 139 02/02/2012 0600   K 4.3 01/28/2013 0854   K 3.7 02/02/2012 0600   CL 107 01/28/2013 0854   CL 110 02/02/2012 0600   CO2 25 01/28/2013 0854   CO2 25 02/02/2012 0600   BUN 11.9 01/28/2013 0854   BUN 7 02/02/2012 0600   CREATININE 0.8 01/28/2013 0854   CREATININE 0.56 02/02/2012 0600   GLU 569* 01/26/2009 1508      Component Value Date/Time   CALCIUM 9.0 01/28/2013 0854   CALCIUM 8.0* 02/02/2012 0600   ALKPHOS 85 01/28/2013 0854   ALKPHOS 58 02/02/2012 0600   AST 28 01/28/2013 0854   AST 124* 02/02/2012 0600   ALT 23 01/28/2013 0854   ALT 52* 02/02/2012 0600   BILITOT 0.75 01/28/2013 0854   BILITOT 0.4 02/02/2012 0600     ASSESSMENT: Left breast cancer, T1, N0, M0, ER positive, PR negative, HER-2/neu negative   PLAN:  #1 left breast cancer The patient has chemotherapy, surgery, adjuvant radiation therapy, and currently on adjuvant endocrine therapy. She has received 4 years of tamoxifen and most recently was switched to letrozole. I recommend we continue letrozole for 2-3 years as tolerated. She is due for mammogram and tolerate today #2 myalgias and arthralgias This is secondary to side effects of letrozole. I recommend calcium and vitamin D. The patient stated Tylenol or Advil does not help with this pain. I gave her a small prescription of tramadol to try. I did discuss with her  some of the side effects to be expected including sedation and constipation. See her back in 6 months  All questions were answered. The patient knows to call the clinic with any problems, questions or concerns. We can certainly see the patient much sooner if necessary. No barriers to learning was detected.  The patient and plan discussed with [facility], Zandria Woldt  and she is in agreement with the aforementioned.  I spent 25 minutes counseling the patient face to face. The total time spent in the appointment was 40 minutes and more than 50% was on counseling.     [facility], Isley Zinni, MD 08/13/2013 1:41 PM

## 2013-08-13 NOTE — Telephone Encounter (Signed)
Gave pt appt appt for lab and MD on March 2015 then mammogram on October 2014

## 2013-09-02 ENCOUNTER — Other Ambulatory Visit (HOSPITAL_COMMUNITY): Payer: Self-pay | Admitting: *Deleted

## 2013-09-02 DIAGNOSIS — Z853 Personal history of malignant neoplasm of breast: Secondary | ICD-10-CM

## 2013-09-03 ENCOUNTER — Encounter (HOSPITAL_COMMUNITY): Payer: Self-pay

## 2013-09-03 ENCOUNTER — Ambulatory Visit (HOSPITAL_COMMUNITY)
Admission: RE | Admit: 2013-09-03 | Discharge: 2013-09-03 | Disposition: A | Discharge status: Home / Self Care | Payer: No Typology Code available for payment source | Source: Ambulatory Visit | Attending: Obstetrics and Gynecology | Admitting: Obstetrics and Gynecology

## 2013-09-03 VITALS — BP 122/80 | Temp 98.4°F | Ht 61.0 in | Wt 149.4 lb

## 2013-09-03 DIAGNOSIS — Z1239 Encounter for other screening for malignant neoplasm of breast: Secondary | ICD-10-CM

## 2013-09-03 NOTE — Patient Instructions (Signed)
Taught [name] how to perform BSE and gave educational materials to take home. Patient did not need a Pap smear today due to last Pap smear was July 2014 per patient. Let her know [facility]CP will cover Pap smears every 3 years unless has a history of abnormal Pap smears. Referred patient to the [facility] for diagnostic mammogram per recommendation. Appointment scheduled for Monday, September 16, 2013 at 1415. Patient aware of appointment and will be there. Kanijah Zamora-Quintanilla verbalized understanding.  Jayna Mulnix, Kathaleen Maser, RN 4:07 PM

## 2013-09-03 NOTE — Progress Notes (Signed)
No complaints today.  Pap Smear:    Pap smear not completed today. Last Pap smear was July 2014 at Dr. Jearl Klinefelter office and normal per patient. Per patient has no history of an abnormal Pap smear. No Pap smear results in EPIC.  Physical exam: Breasts Right breast larger than left breast due to a history of a left breast lumpectomy due to history of breast cancer. No skin abnormalities bilateral breasts. No nipple retraction bilateral breasts. No nipple discharge bilateral breasts. No lymphadenopathy. No lumps palpated bilateral breasts. No complaints of pain or tenderness on exam. Referred patient to the [facility] for diagnostic mammogram per recommendation due to history of a left breast lumpectomy 4 years ago. Appointment scheduled for Monday, September 16, 2013 at 1415.   Pelvic/Bimanual No Pap smear completed today since last Pap smear was July 2014. Pap smear not indicated per BCCCP guidelines.

## 2013-09-24 ENCOUNTER — Ambulatory Visit
Admission: RE | Admit: 2013-09-24 | Discharge: 2013-09-24 | Disposition: A | Discharge status: Home / Self Care | Payer: Self-pay | Source: Ambulatory Visit | Attending: Obstetrics and Gynecology | Admitting: Obstetrics and Gynecology

## 2013-09-24 ENCOUNTER — Telehealth: Payer: Self-pay | Admitting: Hematology and Oncology

## 2013-09-24 ENCOUNTER — Telehealth: Payer: Self-pay | Admitting: *Deleted

## 2013-09-24 DIAGNOSIS — Z853 Personal history of malignant neoplasm of breast: Secondary | ICD-10-CM

## 2013-09-24 NOTE — Telephone Encounter (Signed)
POF sent and notified Ann in scheduling.

## 2013-09-24 NOTE — Telephone Encounter (Signed)
OK, next available 30 mins appt

## 2013-09-24 NOTE — Telephone Encounter (Signed)
, °

## 2013-09-24 NOTE — Telephone Encounter (Signed)
VM left by spanish interpreter.  She states pt having side effects from Tamoxifen and requests office visit to see Dr. Bertis Ruddy.

## 2013-09-30 ENCOUNTER — Other Ambulatory Visit: Payer: Self-pay | Admitting: Oncology

## 2013-09-30 ENCOUNTER — Ambulatory Visit (HOSPITAL_COMMUNITY)
Admission: RE | Admit: 2013-09-30 | Discharge: 2013-09-30 | Disposition: A | Discharge status: Home / Self Care | Payer: No Typology Code available for payment source | Source: Ambulatory Visit | Attending: Hematology and Oncology | Admitting: Hematology and Oncology

## 2013-09-30 ENCOUNTER — Telehealth: Payer: Self-pay | Admitting: Hematology and Oncology

## 2013-09-30 ENCOUNTER — Other Ambulatory Visit (HOSPITAL_BASED_OUTPATIENT_CLINIC_OR_DEPARTMENT_OTHER): Payer: Self-pay | Admitting: Lab

## 2013-09-30 ENCOUNTER — Encounter: Payer: Self-pay | Admitting: Hematology and Oncology

## 2013-09-30 ENCOUNTER — Ambulatory Visit (HOSPITAL_BASED_OUTPATIENT_CLINIC_OR_DEPARTMENT_OTHER): Payer: Self-pay | Admitting: Hematology and Oncology

## 2013-09-30 VITALS — BP 151/77 | HR 66 | Temp 97.9°F | Resp 18 | Ht 61.0 in | Wt 146.1 lb

## 2013-09-30 DIAGNOSIS — R072 Precordial pain: Secondary | ICD-10-CM

## 2013-09-30 DIAGNOSIS — Z853 Personal history of malignant neoplasm of breast: Secondary | ICD-10-CM

## 2013-09-30 DIAGNOSIS — R05 Cough: Secondary | ICD-10-CM | POA: Insufficient documentation

## 2013-09-30 DIAGNOSIS — R079 Chest pain, unspecified: Secondary | ICD-10-CM

## 2013-09-30 DIAGNOSIS — R0602 Shortness of breath: Secondary | ICD-10-CM | POA: Insufficient documentation

## 2013-09-30 DIAGNOSIS — Z9010 Acquired absence of unspecified breast and nipple: Secondary | ICD-10-CM | POA: Insufficient documentation

## 2013-09-30 DIAGNOSIS — IMO0001 Reserved for inherently not codable concepts without codable children: Secondary | ICD-10-CM

## 2013-09-30 DIAGNOSIS — R059 Cough, unspecified: Secondary | ICD-10-CM | POA: Insufficient documentation

## 2013-09-30 DIAGNOSIS — C50919 Malignant neoplasm of unspecified site of unspecified female breast: Secondary | ICD-10-CM

## 2013-09-30 HISTORY — DX: Chest pain, unspecified: R07.9

## 2013-09-30 LAB — COMPREHENSIVE METABOLIC PANEL (CC13)
AST: 50 U/L — ABNORMAL HIGH (ref 5–34)
Albumin: 3.7 g/dL (ref 3.5–5.0)
Alkaline Phosphatase: 108 U/L (ref 40–150)
Anion Gap: 13 mEq/L — ABNORMAL HIGH (ref 3–11)
Potassium: 4.2 mEq/L (ref 3.5–5.1)
Sodium: 139 mEq/L (ref 136–145)
Total Bilirubin: 0.89 mg/dL (ref 0.20–1.20)
Total Protein: 7.5 g/dL (ref 6.4–8.3)

## 2013-09-30 LAB — CBC WITH DIFFERENTIAL/PLATELET
EOS%: 3.2 % (ref 0.0–7.0)
HGB: 13.4 g/dL (ref 11.6–15.9)
MCH: 30.3 pg (ref 25.1–34.0)
MCHC: 34.2 g/dL (ref 31.5–36.0)
MCV: 88.5 fL (ref 79.5–101.0)
MONO%: 7.7 % (ref 0.0–14.0)
RBC: 4.42 10*6/uL (ref 3.70–5.45)
RDW: 12.8 % (ref 11.2–14.5)
WBC: 5.8 10*3/uL (ref 3.9–10.3)
lymph#: 2.1 10*3/uL (ref 0.9–3.3)

## 2013-09-30 NOTE — Telephone Encounter (Signed)
per 11/10 POF sch and arrived lab for today appt w NG tomorrow sent pt to lab and xray today AVS and cal printed shh

## 2013-09-30 NOTE — Progress Notes (Signed)
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 She received adjuvant radiation therapy. She was on adjuvant hormonal therapy with tamoxifen started in August 2010. In March 2014: her treatment is switched to Femara.  INTERVAL HISTORY: Brandi Bryant 57 y.o. female returns for further followup. Through her daughter who acts as an interpreter, she's been having inspiratory pleuritic chest pain that comes and goes over the past one week. She denies any recent fever, chills, night sweats or abnormal weight loss She denies any coughing. Her musculoskeletal pain is diffuse her body since she began Femara. She is rating her chest pain as 5/10 pain, central at the substernal region with the radiation. There is family history of cardiac disease in her mother. The patient is diabetic and does not take aspirin.  I have reviewed the past medical history, past surgical history, social history and family history with the patient and they are unchanged from previous note.  ALLERGIES:  is allergic to gadolinium derivatives.  MEDICATIONS:  Current Outpatient Prescriptions  Medication Sig Dispense Refill  . ibuprofen (ADVIL,MOTRIN) 800 MG tablet Take 800 mg by mouth every 8 (eight) hours as needed for pain.      Marland Kitchen letrozole (FEMARA) 2.5 MG tablet Take 1 tablet (2.5 mg total) by mouth daily.  30 tablet  5  . lisinopril-hydrochlorothiazide (PRINZIDE,ZESTORETIC) 10-12.5 MG per tablet  Take 1 tablet by mouth daily.      . metFORMIN (GLUCOPHAGE) 500 MG tablet Take 500 mg by mouth 2 (two) times daily with a meal.      . Multiple Vitamins-Minerals (MULTIVITAMIN WITH MINERALS) tablet Take 1 tablet by mouth daily.      . [DISCONTINUED] escitalopram (LEXAPRO) 10 MG tablet Take 10 mg by mouth daily.       No current facility-administered medications for this visit.    REVIEW OF SYSTEMS:   Constitutional: Denies fevers, chills or abnormal weight loss Eyes: Denies blurriness of vision Ears, nose, mouth, throat, and face: Denies mucositis or sore throat Respiratory: Denies cough, dyspnea or wheezes Cardiovascular: Denies palpitation, or lower extremity swelling Gastrointestinal:  Denies nausea, heartburn or change in bowel habits Skin: Denies abnormal skin rashes Lymphatics: Denies new lymphadenopathy or easy bruising Neurological:Denies numbness, tingling or new weaknesses Behavioral/Psych: Mood is stable, no new changes  All other systems were reviewed with the patient and are negative.  PHYSICAL EXAMINATION: ECOG PERFORMANCE STATUS: 1 - Symptomatic but completely ambulatory  Filed Vitals:   09/30/13 0921  BP: 151/77  Pulse: 66  Temp: 97.9 F (36.6 C)  Resp: 18   Filed Weights   09/30/13 0921  Weight: 146 lb 1.6 oz (66.271 kg)    GENERAL:alert, no distress and comfortable SKIN: skin color, texture, turgor are normal, no rashes or significant lesions EYES: normal, Conjunctiva are pink and non-injected, sclera clear OROPHARYNX:no exudate, no erythema and lips, buccal mucosa, and tongue normal  NECK: supple, thyroid normal size, non-tender, without nodularity LYMPH:  no palpable lymphadenopathy in the cervical, axillary or inguinal LUNGS: clear to  auscultation and percussion with normal breathing effort HEART: regular rate & rhythm and no murmurs and no lower extremity edema ABDOMEN:abdomen soft, non-tender and normal bowel sounds Musculoskeletal:no cyanosis of  digits and no clubbing  NEURO: alert & oriented x 3 with fluent speech, no focal motor/sensory deficits  LABORATORY DATA:  I have reviewed the data as listed    Component Value Date/Time   NA 140 01/28/2013 0854   NA 139 02/02/2012 0600   K 4.3 01/28/2013 0854   K 3.7 02/02/2012 0600   CL 107 01/28/2013 0854   CL 110 02/02/2012 0600   CO2 25 01/28/2013 0854   CO2 25 02/02/2012 0600   GLUCOSE 243* 01/28/2013 0854   GLUCOSE 125* 02/02/2012 0600   BUN 11.9 01/28/2013 0854   BUN 7 02/02/2012 0600   CREATININE 0.8 01/28/2013 0854   CREATININE 0.56 02/02/2012 0600   CALCIUM 9.0 01/28/2013 0854   CALCIUM 8.0* 02/02/2012 0600   PROT 6.6 01/28/2013 0854   PROT 5.5* 02/02/2012 0600   ALBUMIN 3.3* 01/28/2013 0854   ALBUMIN 2.7* 02/02/2012 0600   AST 28 01/28/2013 0854   AST 124* 02/02/2012 0600   ALT 23 01/28/2013 0854   ALT 52* 02/02/2012 0600   ALKPHOS 85 01/28/2013 0854   ALKPHOS 58 02/02/2012 0600   BILITOT 0.75 01/28/2013 0854   BILITOT 0.4 02/02/2012 0600   GFRNONAA >90 02/02/2012 0600   GFRAA >90 02/02/2012 0600    No results found for this basename: SPEP,  UPEP,   kappa and lambda light chains    Lab Results  Component Value Date   WBC 5.7 01/28/2013   NEUTROABS 3.2 01/28/2013   HGB 12.8 01/28/2013   HCT 36.9 01/28/2013   MCV 89.6 01/28/2013   PLT 132* 01/28/2013      Chemistry      Component Value Date/Time   NA 140 01/28/2013 0854   NA 139 02/02/2012 0600   K 4.3 01/28/2013 0854   K 3.7 02/02/2012 0600   CL 107 01/28/2013 0854   CL 110 02/02/2012 0600   CO2 25 01/28/2013 0854   CO2 25 02/02/2012 0600   BUN 11.9 01/28/2013 0854   BUN 7 02/02/2012 0600   CREATININE 0.8 01/28/2013 0854   CREATININE 0.56 02/02/2012 0600   GLU 569* 01/26/2009 1508      Component Value Date/Time   CALCIUM 9.0 01/28/2013 0854   CALCIUM 8.0* 02/02/2012 0600   ALKPHOS 85 01/28/2013 0854   ALKPHOS 58 02/02/2012 0600   AST 28 01/28/2013 0854   AST 124* 02/02/2012 0600   ALT 23 01/28/2013 0854   ALT 52* 02/02/2012 0600    BILITOT 0.75 01/28/2013 0854   BILITOT 0.4 02/02/2012 0600     ASSESSMENT:  #1 chest pain #2 history of breast cancer #3 chronic musculoskeletal pain  PLAN:  #1 chest pain This is of concern to me. Differential diagnoses included possible undiagnosed PE or cardiac problem. I am going to order d-dimer and troponin and a chest x-ray. I will bring her back tomorrow to review test results #2 history of breast cancer #3 chronic musculoskeletal pain I am Ordering tumor markers and vitamin D level. If tumor markers are elevated, we might have to do a bone scan to rule out metastatic cancer.  In the meantime I have asked the patient to stop aspirin 81 mg daily by mouth interesting case we are dealing with a cardiac event. Due to her risk factor of positive family history of and diabetes,  I discussed with the patient the benefit of starting aspirin and she is in agreement to proceed.  Orders Placed This Encounter  Procedures  . DG Chest 2 View    Standing Status: Future     Number of Occurrences:      Standing Expiration Date: 09/30/2014    Order Specific Question:  Reason for exam:    Answer:  cough, chest pain    Order Specific Question:  Preferred imaging location?    Answer:  [facility]  . CBC with Differential    Standing Status: Future     Number of Occurrences:      Standing Expiration Date: 06/22/2014  . Comprehensive metabolic panel    Standing Status: Future     Number of Occurrences:      Standing Expiration Date: 09/30/2014  . D-dimer, quantitative    Standing Status: Future     Number of Occurrences:      Standing Expiration Date: 09/30/2014  . Troponin I    Standing Status: Future     Number of Occurrences:      Standing Expiration Date: 09/30/2014   All questions were answered. The patient knows to call the clinic with any problems, questions or concerns. No barriers to learning was detected.    Krissa Utke, MD 09/30/2013 10:02 AM

## 2013-10-01 ENCOUNTER — Encounter: Payer: Self-pay | Admitting: Hematology and Oncology

## 2013-10-01 LAB — TROPONIN I: Troponin I: 0.01 ng/mL (ref <0.06)

## 2013-10-01 NOTE — Progress Notes (Signed)
I spoke with the patient's son who acted as Equities trader. Her troponin and d-dimer was normal. I discussed the need for CT scan of the chest to rule out lung nodule progression of cancer that could be the cause of her chest pain. She agreed. I will reschedule her appointment from today to 2 weeks and order a CT scan of the chest. This encounter was created in error - please disregard.

## 2013-10-04 ENCOUNTER — Telehealth: Payer: Self-pay | Admitting: Hematology and Oncology

## 2013-10-04 NOTE — Telephone Encounter (Signed)
Gave appt for CT to pt's son Heather Roberts , inform him that they might charge her since pt does not have insurance

## 2013-10-10 ENCOUNTER — Ambulatory Visit (HOSPITAL_COMMUNITY)
Admission: RE | Admit: 2013-10-10 | Discharge: 2013-10-10 | Disposition: A | Discharge status: Home / Self Care | Payer: No Typology Code available for payment source | Source: Ambulatory Visit | Attending: Hematology and Oncology | Admitting: Hematology and Oncology

## 2013-10-10 DIAGNOSIS — I77810 Thoracic aortic ectasia: Secondary | ICD-10-CM | POA: Insufficient documentation

## 2013-10-10 DIAGNOSIS — I2510 Atherosclerotic heart disease of native coronary artery without angina pectoris: Secondary | ICD-10-CM | POA: Insufficient documentation

## 2013-10-10 DIAGNOSIS — R079 Chest pain, unspecified: Secondary | ICD-10-CM

## 2013-10-10 DIAGNOSIS — I700 Atherosclerosis of aorta: Secondary | ICD-10-CM | POA: Insufficient documentation

## 2013-10-10 DIAGNOSIS — C50919 Malignant neoplasm of unspecified site of unspecified female breast: Secondary | ICD-10-CM | POA: Insufficient documentation

## 2013-10-10 DIAGNOSIS — Z853 Personal history of malignant neoplasm of breast: Secondary | ICD-10-CM

## 2013-10-14 ENCOUNTER — Ambulatory Visit (HOSPITAL_BASED_OUTPATIENT_CLINIC_OR_DEPARTMENT_OTHER): Payer: Self-pay | Admitting: Hematology and Oncology

## 2013-10-14 ENCOUNTER — Telehealth: Payer: Self-pay | Admitting: Hematology and Oncology

## 2013-10-14 VITALS — BP 131/96 | HR 91 | Temp 98.9°F | Resp 18 | Ht 61.0 in | Wt 148.7 lb

## 2013-10-14 DIAGNOSIS — I10 Essential (primary) hypertension: Secondary | ICD-10-CM

## 2013-10-14 DIAGNOSIS — R0789 Other chest pain: Secondary | ICD-10-CM

## 2013-10-14 DIAGNOSIS — Z853 Personal history of malignant neoplasm of breast: Secondary | ICD-10-CM

## 2013-10-14 NOTE — Telephone Encounter (Signed)
Appts made per 11/24 POF Will follow for referral to Cardiologist to be completed shh

## 2013-10-14 NOTE — Telephone Encounter (Signed)
sw Debra at Dr [facility] and sche pt for Cardiology Consult on 12/8 at 10 am Office will mail pt a New Patient Packet w directions I sw pt and advised same shh

## 2013-10-14 NOTE — Progress Notes (Signed)
[facility] OFFICE PROGRESS NOTE  Patient Care Team: Pcp Not In System as PCP - General Artis Delay, MD as Consulting Physician (Hematology and Oncology)  DIAGNOSIS: Intermittent chest discomfort, history of breast cancer  SUMMARY OF ONCOLOGIC HISTORY: The patient had history of left breast 1.9 cm invasive ductal carcinoma low grade, ER +76%, PR -0%, Ki-67 25%, HER-2/neu negative with CISH ratio of 1.0. She had neoadjuvant chemotherapy with Cytoxan and Taxotere is status post lumpectomy with sentinel lymph node on 04/21/2009. She received adjuvant radiation therapy. She was on adjuvant hormonal therapy with tamoxifen started in August 2010. In March 2014: her treatment is switched to Femara.  INTERVAL HISTORY: Brandi Bryant 57 y.o. female returns for further followup. Her chest pain has improved. She still had intermittent chest discomfort. Denies any lightheadedness, dizziness, or shortness of breath on exertion.  I have reviewed the past medical history, past surgical history, social history and family history with the patient and they are unchanged from previous note.  ALLERGIES:  is allergic to gadolinium derivatives.  MEDICATIONS:  Current Outpatient Prescriptions  Medication Sig Dispense Refill  . ibuprofen (ADVIL,MOTRIN) 800 MG tablet Take 800 mg by mouth every 8 (eight) hours as needed for pain.      Marland Kitchen letrozole (FEMARA) 2.5 MG tablet TAKE 1 TABLET BY MOUTH ONCE DAILY  30 tablet  5  . lisinopril-hydrochlorothiazide (PRINZIDE,ZESTORETIC) 10-12.5 MG per tablet Take 1 tablet by mouth daily.      . metFORMIN (GLUCOPHAGE) 500 MG tablet Take 500 mg by mouth 2 (two) times daily with a meal.      . Multiple Vitamins-Minerals (MULTIVITAMIN WITH MINERALS) tablet Take 1 tablet by mouth daily.      . [DISCONTINUED] escitalopram (LEXAPRO) 10 MG tablet Take 10 mg by mouth daily.       No current facility-administered medications for this visit.    REVIEW OF SYSTEMS:    Constitutional: Denies fevers, chills or abnormal weight loss Behavioral/Psych: Mood is stable, no new changes  All other systems were reviewed with the patient and are negative.  PHYSICAL EXAMINATION: ECOG PERFORMANCE STATUS: 0 - Asymptomatic  Filed Vitals:   10/14/13 1546  BP: 131/96  Pulse: 91  Temp: 98.9 F (37.2 C)  Resp: 18   Filed Weights   10/14/13 1546  Weight: 148 lb 11.2 oz (67.45 kg)    GENERAL:alert, no distress and comfortable NEURO: alert & oriented x 3 with fluent speech, no focal motor/sensory deficits  LABORATORY DATA:  I have reviewed the data as listed    Component Value Date/Time   NA 139 09/30/2013 1033   NA 139 02/02/2012 0600   K 4.2 09/30/2013 1033   K 3.7 02/02/2012 0600   CL 107 01/28/2013 0854   CL 110 02/02/2012 0600   CO2 22 09/30/2013 1033   CO2 25 02/02/2012 0600   GLUCOSE 256* 09/30/2013 1033   GLUCOSE 243* 01/28/2013 0854   GLUCOSE 125* 02/02/2012 0600   BUN 13.0 09/30/2013 1033   BUN 7 02/02/2012 0600   CREATININE 0.8 09/30/2013 1033   CREATININE 0.56 02/02/2012 0600   CALCIUM 9.9 09/30/2013 1033   CALCIUM 8.0* 02/02/2012 0600   PROT 7.5 09/30/2013 1033   PROT 5.5* 02/02/2012 0600   ALBUMIN 3.7 09/30/2013 1033   ALBUMIN 2.7* 02/02/2012 0600   AST 50* 09/30/2013 1033   AST 124* 02/02/2012 0600   ALT 45 09/30/2013 1033   ALT 52* 02/02/2012 0600   ALKPHOS 108 09/30/2013 1033   ALKPHOS  58 02/02/2012 0600   BILITOT 0.89 09/30/2013 1033   BILITOT 0.4 02/02/2012 0600   GFRNONAA >90 02/02/2012 0600   GFRAA >90 02/02/2012 0600    No results found for this basename: SPEP,  UPEP,   kappa and lambda light chains    Lab Results  Component Value Date   WBC 5.8 09/30/2013   NEUTROABS 3.1 09/30/2013   HGB 13.4 09/30/2013   HCT 39.1 09/30/2013   MCV 88.5 09/30/2013   PLT 150 09/30/2013      Chemistry      Component Value Date/Time   NA 139 09/30/2013 1033   NA 139 02/02/2012 0600   K 4.2 09/30/2013 1033   K 3.7 02/02/2012 0600   CL 107  01/28/2013 0854   CL 110 02/02/2012 0600   CO2 22 09/30/2013 1033   CO2 25 02/02/2012 0600   BUN 13.0 09/30/2013 1033   BUN 7 02/02/2012 0600   CREATININE 0.8 09/30/2013 1033   CREATININE 0.56 02/02/2012 0600   GLU 569* 01/26/2009 1508      Component Value Date/Time   CALCIUM 9.9 09/30/2013 1033   CALCIUM 8.0* 02/02/2012 0600   ALKPHOS 108 09/30/2013 1033   ALKPHOS 58 02/02/2012 0600   AST 50* 09/30/2013 1033   AST 124* 02/02/2012 0600   ALT 45 09/30/2013 1033   ALT 52* 02/02/2012 0600   BILITOT 0.89 09/30/2013 1033   BILITOT 0.4 02/02/2012 0600     RADIOGRAPHIC STUDIES: I have personally reviewed the radiological images as listed and agreed with the findings in the report. I reviewed the CT scan of the chest with her. They are presence of calcium deposit in her coronary arteries, suspicious for coronary artery disease. 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 I reassured her. She will continue on Femara #2 chest discomfort The patient had multiple risk factors for heart disease including diabetes and hypertension. Her blood pressure control is suboptimal, with consistent high blood pressure records here. I recommend she reduced salt intake. I recommend a cardiology evaluation to rule out coronary artery disease and to monitor for the ectasia of the ascending aorta. She agreed to for the referral.  Orders Placed This Encounter  Procedures  . Ambulatory referral to Cardiology    Referral Priority:  Routine    Referral Type:  Consultation    Referral Reason:  Specialty Services Required    Referred to Provider:  Lars Masson, MD    Requested Specialty:  Cardiology    Number of Visits Requested:  1   All questions were answered. The patient knows to call the clinic with any problems, questions or concerns. No barriers to learning was detected.    [facility], Darrik Richman,  MD 10/14/2013 4:03 PM

## 2013-10-23 ENCOUNTER — Encounter: Payer: Self-pay | Admitting: *Deleted

## 2013-10-28 ENCOUNTER — Ambulatory Visit (INDEPENDENT_AMBULATORY_CARE_PROVIDER_SITE_OTHER): Payer: No Typology Code available for payment source | Admitting: Cardiology

## 2013-10-28 ENCOUNTER — Other Ambulatory Visit: Payer: Self-pay

## 2013-10-28 ENCOUNTER — Encounter: Payer: Self-pay | Admitting: Cardiology

## 2013-10-28 VITALS — BP 150/100 | HR 74 | Ht 61.0 in | Wt 147.0 lb

## 2013-10-28 DIAGNOSIS — Z01810 Encounter for preprocedural cardiovascular examination: Secondary | ICD-10-CM

## 2013-10-28 DIAGNOSIS — R079 Chest pain, unspecified: Secondary | ICD-10-CM

## 2013-10-28 DIAGNOSIS — Z7901 Long term (current) use of anticoagulants: Secondary | ICD-10-CM

## 2013-10-28 LAB — CBC WITH DIFFERENTIAL/PLATELET
Basophils Absolute: 0 10*3/uL (ref 0.0–0.1)
Basophils Relative: 0.2 % (ref 0.0–3.0)
Eosinophils Absolute: 0.3 10*3/uL (ref 0.0–0.7)
Eosinophils Relative: 4.6 % (ref 0.0–5.0)
HCT: 38.8 % (ref 36.0–46.0)
Hemoglobin: 13.4 g/dL (ref 12.0–15.0)
Lymphocytes Relative: 29.8 % (ref 12.0–46.0)
Lymphs Abs: 1.7 10*3/uL (ref 0.7–4.0)
MCHC: 34.5 g/dL (ref 30.0–36.0)
MCV: 88.3 fl (ref 78.0–100.0)
Monocytes Absolute: 0.4 10*3/uL (ref 0.1–1.0)
Monocytes Relative: 7.8 % (ref 3.0–12.0)
Neutro Abs: 3.2 10*3/uL (ref 1.4–7.7)
Neutrophils Relative %: 57.6 % (ref 43.0–77.0)
Platelets: 152 10*3/uL (ref 150.0–400.0)
RBC: 4.39 Mil/uL (ref 3.87–5.11)
RDW: 12.9 % (ref 11.5–14.6)
WBC: 5.6 10*3/uL (ref 4.5–10.5)

## 2013-10-28 LAB — BASIC METABOLIC PANEL
BUN: 15 mg/dL (ref 6–23)
CO2: 25 mEq/L (ref 19–32)
Calcium: 9.5 mg/dL (ref 8.4–10.5)
Chloride: 101 mEq/L (ref 96–112)
Creatinine, Ser: 0.7 mg/dL (ref 0.4–1.2)
GFR: 87.48 mL/min (ref >60.00)
Glucose, Bld: 278 mg/dL — ABNORMAL HIGH (ref 70–99)
Potassium: 4 mEq/L (ref 3.5–5.1)
Sodium: 137 mEq/L (ref 135–145)

## 2013-10-28 LAB — LIPID PANEL
Cholesterol: 158 mg/dL (ref 0–200)
HDL: 39.2 mg/dL (ref >39.00)
LDL Cholesterol: 86 mg/dL (ref 0–99)
Total CHOL/HDL Ratio: 4
Triglycerides: 165 mg/dL — ABNORMAL HIGH (ref 0.0–149.0)
VLDL: 33 mg/dL (ref 0.0–40.0)

## 2013-10-28 LAB — PROTIME-INR
INR: 1.2 ratio — ABNORMAL HIGH (ref 0.8–1.0)
Prothrombin Time: 12.2 s (ref 10.2–12.4)

## 2013-10-28 MED ORDER — CARVEDILOL 6.25 MG PO TABS: 6.2500 mg | ORAL_TABLET | Freq: Two times a day (BID) | ORAL | Status: DC

## 2013-10-28 MED ORDER — ASPIRIN EC 81 MG PO TBEC: 81.0000 mg | DELAYED_RELEASE_TABLET | Freq: Every day | ORAL | Status: DC

## 2013-10-28 NOTE — Progress Notes (Signed)
Patient ID: Brandi Bryant, female   DOB: 01/08/1956, 57 y.o.   MRN: [id number]     Patient Name: Brandi Bryant Date of Encounter: 10/28/2013  Primary Care Provider:  Pcp Not In System Primary Cardiologist:  Shin Lamour, H   Problem List   Past Medical History  Diagnosis Date  . Breast cancer   . HTN (hypertension)   . Diabetes mellitus   . Cholelithiasis   . Hepatic steatosis   . Abdominal pain   . History of breast cancer 06/01/2012  . Arthralgia 08/13/2013  . Chest pain 09/30/2013   Past Surgical History  Procedure Laterality Date  . Tubal ligation    . Breast lumpectomy  2010    Allergies  Allergies  Allergen Reactions  . 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 She received adjuvant radiation therapy. She was on adjuvant hormonal therapy with tamoxifen started in August 2010. In March 2014: her treatment is switched to Femara. Her most recent CAT scan showed no evidence of metastatic disease. The same CT chest showed classifications of her coronary vessels including left main and LAD.  The patient has been experiencing exertional dyspnea after walking a flight of stairs, that eventually progresses to chest pain on almost any exertion. This has been going on for last couple weeks. The patient describes her pain as someone sitting on her chest with radiation to her neck and palpitations. After she takes stress her pain resolves in about 10 minutes. She denies syncope orthopnea, paroxysmal nocturnal dyspnea, lower extremity  edema. She has never smoked. No significant family history of coronary artery disease.   Home Medications  Prior to Admission medications   Medication Sig Start Date End Date Taking? Authorizing Provider  ibuprofen (ADVIL,MOTRIN) 800 MG tablet Take 800 mg by mouth every 8 (eight) hours as needed for pain.   Yes Historical Provider, MD  letrozole (FEMARA) 2.5 MG tablet TAKE 1 TABLET BY MOUTH ONCE DAILY 09/30/13  Yes Ni Gorsuch, MD  lisinopril-hydrochlorothiazide (PRINZIDE,ZESTORETIC) 10-12.5 MG per tablet Take 1 tablet by mouth daily.   Yes Historical Provider, MD  metFORMIN (GLUCOPHAGE) 500 MG tablet Take 500 mg by mouth 2 (two) times daily with a meal.   Yes Historical Provider, MD  Multiple Vitamins-Minerals (MULTIVITAMIN WITH MINERALS) tablet Take 1 tablet by mouth daily.   Yes Historical Provider, MD    Family History  Family History  Problem Relation Age of Onset  . Thyroid cancer Sister     thyroid    Social History  History   Social History  . Marital Status: Single    Spouse Name: N/A    Number of Children: N/A  . Years of Education: N/A   Occupational History  . Not on file.   Social History Main Topics  . Smoking status: Never Smoker   . Smokeless tobacco: Not on file  . Alcohol Use: No  . Drug Use: No  . Sexual Activity: Not Currently    Birth Control/ Protection: Surgical   Other Topics Concern  . Not on file   Social History Narrative  .   No narrative on file     Review of Systems, as per HPI, otherwise negative General:  No chills, fever, night sweats or weight changes.  Cardiovascular:  No chest pain, dyspnea on exertion, edema, orthopnea, palpitations, paroxysmal nocturnal dyspnea. Dermatological: No rash, lesions/masses Respiratory: No cough, dyspnea Urologic: No hematuria, dysuria Abdominal:   No nausea, vomiting, diarrhea, bright red blood per rectum, melena, or hematemesis Neurologic:  No visual changes, wkns, changes in mental status. All  other systems reviewed and are otherwise negative except as noted above.  Physical Exam  Blood pressure 150/100, pulse 74, height 5' 1" (1.549 m), weight 147 lb (66.679 kg).  General: Pleasant, NAD Psych: Normal affect. Neuro: Alert and oriented X 3. Moves all extremities spontaneously. HEENT: Normal  Neck: Supple without bruits or JVD. Lungs:  Resp regular and unlabored, CTA. Heart: RRR no s3, s4, or murmurs. Abdomen: Soft, non-tender, non-distended, BS + x 4.  Extremities: No clubbing, cyanosis or edema. DP/PT/Radials 2+ and equal bilaterally.  Labs:  No results found for this basename: CKTOTAL, CKMB, TROPONINI,  in the last 72 hours Lab Results  Component Value Date   WBC 5.8 09/30/2013   HGB 13.4 09/30/2013   HCT 39.1 09/30/2013   MCV 88.5 09/30/2013   PLT 150 09/30/2013   No results found for this basename: NA, K, CL, CO2, BUN, CREATININE, CALCIUM, LABALBU, PROT, BILITOT, ALKPHOS, ALT, AST, GLUCOSE,  in the last 168 hours Lab Results  Component Value Date   CHOL 82 02/02/2012   HDL 27* 02/02/2012   LDLCALC 33 02/02/2012   TRIG 110 02/02/2012   Lab Results  Component Value Date   DDIMER 0.33 09/30/2013   No components found with this basename: POCBNP,   Accessory Clinical Findings  echocardiogram  ECG - normal sinus rhythm, 74 beats per minutes, normal EKG   Assessment & Plan  57-year-old female  1. typical exertional chest pain - in a patient with multiple risk factors including diabetes, uncontrolled hypertension, prior radiation therapy, and evidence of calyceal on chest CT scan. We'll refer the patient for a cardiac cath these Wednesday, 10/30/2013. We'll start patient on low-dose aspirin.  2. Hypertension - uncontrolled we will add carvedilol 6.25 mg by mouth daily  3. Lipid - not checked since 2013 we'll check today.  BMP and lipids today, followup after cath  Ameli Sangiovanni, H, MD, FACC 10/28/2013, 10:37 AM     

## 2013-10-28 NOTE — Patient Instructions (Addendum)
**Note De-Identified Ashe Gago Obfuscation** Your physician has requested that you have a cardiac catheterization. Cardiac catheterization is used to diagnose and/or treat various heart conditions. Doctors may recommend this procedure for a number of different reasons. The most common reason is to evaluate chest pain. Chest pain can be a symptom of coronary artery disease (CAD), and cardiac catheterization can show whether plaque is narrowing or blocking your heart's arteries. This procedure is also used to evaluate the valves, as well as measure the blood flow and oxygen levels in different parts of your heart. For further information please visit [email or web address] Please follow instruction sheet, as given.  Your physician recommends that you return for lab work in: today  Your physician has recommended you make the following change in your medication: start taking Aspirin 81 mg daily and Carvedilol 6.25 mg twice daily  Your physician recommends that you schedule a follow-up appointment in: after cath.

## 2013-10-30 ENCOUNTER — Encounter (HOSPITAL_COMMUNITY)
Admission: RE | Disposition: A | Discharge status: Home / Self Care | Payer: Self-pay | Source: Ambulatory Visit | Attending: Cardiovascular Disease

## 2013-10-30 ENCOUNTER — Ambulatory Visit (HOSPITAL_COMMUNITY)
Admission: RE | Admit: 2013-10-30 | Discharge: 2013-10-30 | Disposition: A | Discharge status: Home / Self Care | Payer: Self-pay | Source: Ambulatory Visit | Attending: Cardiovascular Disease | Admitting: Cardiovascular Disease

## 2013-10-30 DIAGNOSIS — R079 Chest pain, unspecified: Secondary | ICD-10-CM | POA: Insufficient documentation

## 2013-10-30 DIAGNOSIS — Z79899 Other long term (current) drug therapy: Secondary | ICD-10-CM | POA: Insufficient documentation

## 2013-10-30 DIAGNOSIS — E119 Type 2 diabetes mellitus without complications: Secondary | ICD-10-CM | POA: Insufficient documentation

## 2013-10-30 DIAGNOSIS — Z853 Personal history of malignant neoplasm of breast: Secondary | ICD-10-CM | POA: Insufficient documentation

## 2013-10-30 DIAGNOSIS — K7689 Other specified diseases of liver: Secondary | ICD-10-CM | POA: Insufficient documentation

## 2013-10-30 DIAGNOSIS — I2510 Atherosclerotic heart disease of native coronary artery without angina pectoris: Secondary | ICD-10-CM

## 2013-10-30 DIAGNOSIS — I10 Essential (primary) hypertension: Secondary | ICD-10-CM | POA: Insufficient documentation

## 2013-10-30 DIAGNOSIS — Z794 Long term (current) use of insulin: Secondary | ICD-10-CM | POA: Insufficient documentation

## 2013-10-30 HISTORY — PX: LEFT HEART CATHETERIZATION WITH CORONARY ANGIOGRAM: SHX5451

## 2013-10-30 SURGERY — LEFT HEART CATHETERIZATION WITH CORONARY ANGIOGRAM
Anesthesia: LOCAL

## 2013-10-30 MED ORDER — NITROGLYCERIN 0.2 MG/ML ON CALL CATH LAB
INTRAVENOUS | Status: AC
  Filled 2013-10-30: qty 1

## 2013-10-30 MED ORDER — MIDAZOLAM HCL 2 MG/2ML IJ SOLN
INTRAMUSCULAR | Status: AC
  Filled 2013-10-30: qty 2

## 2013-10-30 MED ORDER — ASPIRIN 81 MG PO CHEW: 81.0000 mg | CHEWABLE_TABLET | ORAL | Status: DC

## 2013-10-30 MED ORDER — VERAPAMIL HCL 2.5 MG/ML IV SOLN
INTRAVENOUS | Status: AC
  Filled 2013-10-30: qty 2

## 2013-10-30 MED ORDER — DIAZEPAM 5 MG PO TABS
5.0000 mg | ORAL_TABLET | Freq: Once | ORAL | Status: DC
  Filled 2013-10-30: qty 1

## 2013-10-30 MED ORDER — LIDOCAINE HCL (PF) 1 % IJ SOLN
INTRAMUSCULAR | Status: AC
  Filled 2013-10-30: qty 30

## 2013-10-30 MED ORDER — DIAZEPAM 2 MG PO TABS: 2.0000 mg | ORAL_TABLET | ORAL | Status: DC | PRN

## 2013-10-30 MED ORDER — SODIUM CHLORIDE 0.9 % IV SOLN: INTRAVENOUS | Status: DC

## 2013-10-30 MED ORDER — ONDANSETRON HCL 4 MG/2ML IJ SOLN: 4.0000 mg | Freq: Four times a day (QID) | INTRAMUSCULAR | Status: DC | PRN

## 2013-10-30 MED ORDER — HEPARIN SODIUM (PORCINE) 1000 UNIT/ML IJ SOLN
INTRAMUSCULAR | Status: AC
  Filled 2013-10-30: qty 1

## 2013-10-30 MED ORDER — FENTANYL CITRATE 0.05 MG/ML IJ SOLN
INTRAMUSCULAR | Status: AC
  Filled 2013-10-30: qty 2

## 2013-10-30 MED ORDER — SODIUM CHLORIDE 0.9 % IJ SOLN: 3.0000 mL | INTRAMUSCULAR | Status: DC | PRN

## 2013-10-30 MED ORDER — HEPARIN (PORCINE) IN NACL 2-0.9 UNIT/ML-% IJ SOLN
INTRAMUSCULAR | Status: AC
  Filled 2013-10-30: qty 1500

## 2013-10-30 MED ORDER — OXYCODONE-ACETAMINOPHEN 5-325 MG PO TABS: 1.0000 | ORAL_TABLET | ORAL | Status: DC | PRN

## 2013-10-30 MED ORDER — SODIUM CHLORIDE 0.9 % IV SOLN: 250.0000 mL | INTRAVENOUS | Status: DC | PRN

## 2013-10-30 MED ORDER — SODIUM CHLORIDE 0.45 % IV SOLN: INTRAVENOUS | Status: AC

## 2013-10-30 MED ORDER — SODIUM CHLORIDE 0.9 % IJ SOLN: 3.0000 mL | Freq: Two times a day (BID) | INTRAMUSCULAR | Status: DC

## 2013-10-30 MED ORDER — NITROGLYCERIN 0.4 MG SL SUBL
0.4000 mg | SUBLINGUAL_TABLET | SUBLINGUAL | Status: DC | PRN
  Administered 2013-10-30 (×2): 0.4 mg via SUBLINGUAL
  Filled 2013-10-30 (×2): qty 25

## 2013-10-30 MED ORDER — ACETAMINOPHEN 325 MG PO TABS: 650.0000 mg | ORAL_TABLET | ORAL | Status: DC | PRN

## 2013-10-30 NOTE — H&P (View-Only) (Signed)
Patient ID: Brandi Bryant, female   DOB: 18-Jan-1956, 57 y.o.   MRN: [id number]     Patient Name: Brandi Bryant Date of Encounter: 10/28/2013  Primary Care Provider:  Pcp Not In System Primary Cardiologist:  Tobias Alexander, H   Problem List   Past Medical History  Diagnosis Date  . Breast cancer   . HTN (hypertension)   . Diabetes mellitus   . Cholelithiasis   . Hepatic steatosis   . Abdominal pain   . History of breast cancer 06/01/2012  . Arthralgia 08/13/2013  . Chest pain 09/30/2013   Past Surgical History  Procedure Laterality Date  . Tubal ligation    . Breast lumpectomy  2010    Allergies  Allergies  Allergen Reactions  . 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 She received adjuvant radiation therapy. She was on adjuvant hormonal therapy with tamoxifen started in August 2010. In March 2014: her treatment is switched to Femara. Her most recent CAT scan showed no evidence of metastatic disease. The same CT chest showed classifications of her coronary vessels including left main and LAD.  The patient has been experiencing exertional dyspnea after walking a flight of stairs, that eventually progresses to chest pain on almost any exertion. This has been going on for last couple weeks. The patient describes her pain as someone sitting on her chest with radiation to her neck and palpitations. After she takes stress her pain resolves in about 10 minutes. She denies syncope orthopnea, paroxysmal nocturnal dyspnea, lower extremity  edema. She has never smoked. No significant family history of coronary artery disease.   Home Medications  Prior to Admission medications   Medication Sig Start Date End Date Taking? Authorizing Provider  ibuprofen (ADVIL,MOTRIN) 800 MG tablet Take 800 mg by mouth every 8 (eight) hours as needed for pain.   Yes Historical Provider, MD  letrozole (FEMARA) 2.5 MG tablet TAKE 1 TABLET BY MOUTH ONCE DAILY 09/30/13  Yes Artis Delay, MD  lisinopril-hydrochlorothiazide (PRINZIDE,ZESTORETIC) 10-12.5 MG per tablet Take 1 tablet by mouth daily.   Yes Historical Provider, MD  metFORMIN (GLUCOPHAGE) 500 MG tablet Take 500 mg by mouth 2 (two) times daily with a meal.   Yes Historical Provider, MD  Multiple Vitamins-Minerals (MULTIVITAMIN WITH MINERALS) tablet Take 1 tablet by mouth daily.   Yes Historical Provider, MD    Family History  Family History  Problem Relation Age of Onset  . Thyroid cancer Sister     thyroid    Social History  History   Social History  . Marital Status: Single    Spouse Name: N/A    Number of Children: N/A  . Years of Education: N/A   Occupational History  . Not on file.   Social History Main Topics  . Smoking status: Never Smoker   . Smokeless tobacco: Not on file  . Alcohol Use: No  . Drug Use: No  . Sexual Activity: Not Currently    Birth Control/ Protection: Surgical   Other Topics Concern  . Not on file   Social History Narrative  .  No narrative on file     Review of Systems, as per HPI, otherwise negative General:  No chills, fever, night sweats or weight changes.  Cardiovascular:  No chest pain, dyspnea on exertion, edema, orthopnea, palpitations, paroxysmal nocturnal dyspnea. Dermatological: No rash, lesions/masses Respiratory: No cough, dyspnea Urologic: No hematuria, dysuria Abdominal:   No nausea, vomiting, diarrhea, bright red blood per rectum, melena, or hematemesis Neurologic:  No visual changes, wkns, changes in mental status. All  other systems reviewed and are otherwise negative except as noted above.  Physical Exam  Blood pressure 150/100, pulse 74, height 5\' 1"  (1.549 m), weight 147 lb (66.679 kg).  General: Pleasant, NAD Psych: Normal affect. Neuro: Alert and oriented X 3. Moves all extremities spontaneously. HEENT: Normal  Neck: Supple without bruits or JVD. Lungs:  Resp regular and unlabored, CTA. Heart: RRR no s3, s4, or murmurs. Abdomen: Soft, non-tender, non-distended, BS + x 4.  Extremities: No clubbing, cyanosis or edema. DP/PT/Radials 2+ and equal bilaterally.  Labs:  No results found for this basename: CKTOTAL, CKMB, TROPONINI,  in the last 72 hours Lab Results  Component Value Date   WBC 5.8 09/30/2013   HGB 13.4 09/30/2013   HCT 39.1 09/30/2013   MCV 88.5 09/30/2013   PLT 150 09/30/2013   No results found for this basename: NA, K, CL, CO2, BUN, CREATININE, CALCIUM, LABALBU, PROT, BILITOT, ALKPHOS, ALT, AST, GLUCOSE,  in the last 168 hours Lab Results  Component Value Date   CHOL 82 02/02/2012   HDL 27* 02/02/2012   LDLCALC 33 02/02/2012   TRIG 110 02/02/2012   Lab Results  Component Value Date   DDIMER 0.33 09/30/2013   No components found with this basename: POCBNP,   Accessory Clinical Findings  echocardiogram  ECG - normal sinus rhythm, 74 beats per minutes, normal EKG   Assessment & Plan  57 year old female  1. typical exertional chest pain - in a patient with multiple risk factors including diabetes, uncontrolled hypertension, prior radiation therapy, and evidence of calyceal on chest CT scan. We'll refer the patient for a cardiac cath these Wednesday, 10/30/2013. We'll start patient on low-dose aspirin.  2. Hypertension - uncontrolled we will add carvedilol 6.25 mg by mouth daily  3. Lipid - not checked since 2013 we'll check today.  BMP and lipids today, followup after cath  Tobias Alexander, Rexene Edison, MD, [facility] 10/28/2013, 10:37 AM

## 2013-10-30 NOTE — Interval H&P Note (Signed)
History and Physical Interval Note:  10/30/2013 11:17 AM  Brandi Bryant  has presented today for surgery, with the diagnosis of Chest pain  The various methods of treatment have been discussed with the patient and family. After consideration of risks, benefits and other options for treatment, the patient has consented to  Procedure(s): LEFT HEART CATHETERIZATION WITH CORONARY ANGIOGRAM (N/A) as a surgical intervention .  The patient's history has been reviewed, patient examined, no change in status, stable for surgery.  I have reviewed the patient's chart and labs.  Questions were answered to the patient's satisfaction.     Charlton Haws

## 2013-10-30 NOTE — CV Procedure (Signed)
    Cardiac Catheterization Procedure Note  Name: Brandi Bryant MRN: [id number] DOB: 29-Oct-1956  Procedure: Left Heart Cath, Selective Coronary Angiography, LV angiography  Indication: Chest Pain    Procedural Details: The right wrist was prepped, draped, and anesthetized with 1% lidocaine. Using the modified Seldinger technique, a 5 French sheath was introduced into the right radial artery. 3 mg of verapamil was administered through the sheath, weight-based unfractionated heparin was administered intravenously. Standard Judkins catheters were used for selective coronary angiography and left ventriculography. Catheter exchanges were performed over an exchange length guidewire. There were no immediate procedural complications. A TR band was used for radial hemostasis at the completion of the procedure.  The patient was transferred to the post catheterization recovery area for further monitoring.  Procedural Findings: Hemodynamics: AO  123 73 LV 133 14  Coronary angiography: Coronary dominance: right  Left mainstem:  Normal  Left anterior descending (LAD): 30% calcified disease proximally   D1: 30--40% mid vessel disease  D2:  50% mid vessel disease  D3: small normal  Left circumflex (LCx):  Normal   OM1: large vessel normal  Right coronary artery (RCA):  Large dominant vessel normal  Left ventriculography: Left ventricular systolic function is normal, LVEF is estimated at 55-65%, there is no significant mitral regurgitation   Final Conclusions:   Films reviewed with Dr [place] He agrees high diagonal vessels are not flow limiting Continue to Rx risk factors Appears to have noncardiac chest pain  Recommendations: Medical Rx   Charlton Haws 10/30/2013, 12:24 PM

## 2013-10-30 NOTE — Progress Notes (Signed)
Interpreter Wyvonnia Dusky for Cath Procedure, pre, during, post

## 2013-11-07 ENCOUNTER — Encounter: Payer: Self-pay | Admitting: Cardiology

## 2013-11-07 ENCOUNTER — Ambulatory Visit (INDEPENDENT_AMBULATORY_CARE_PROVIDER_SITE_OTHER): Payer: No Typology Code available for payment source | Admitting: Cardiology

## 2013-11-07 VITALS — BP 124/70 | HR 74 | Ht 60.0 in | Wt 148.0 lb

## 2013-11-07 DIAGNOSIS — E785 Hyperlipidemia, unspecified: Secondary | ICD-10-CM | POA: Insufficient documentation

## 2013-11-07 DIAGNOSIS — I2510 Atherosclerotic heart disease of native coronary artery without angina pectoris: Secondary | ICD-10-CM | POA: Insufficient documentation

## 2013-11-07 MED ORDER — ATORVASTATIN CALCIUM 10 MG PO TABS: 10.0000 mg | ORAL_TABLET | Freq: Every day | ORAL | Status: DC

## 2013-11-07 NOTE — Patient Instructions (Signed)
Your physician recommends that you schedule a follow-up appointment in: 2 MONTHS WITH DR. Delton See  Your physician recommends that you return for lab work in: 1 MONTHS FOR LABS (CMP)  Your physician has recommended you make the following change in your medication:   START ATORVASTATIN 10 MG ONCE A DAY  Your physician recommends that you continue on your current medications as directed. Please refer to the Current Medication list given to you today.

## 2013-11-07 NOTE — Progress Notes (Signed)
Patient ID: Brandi Bryant, female   DOB: 1956/04/28, 57 y.o.   MRN: [id number]     Patient Name: Brandi Bryant Date of Encounter: 11/07/2013  Primary Care Provider:  Pcp Not In System Primary Cardiologist:  Tobias Alexander, H   Problem List   Past Medical History  Diagnosis Date  . Breast cancer   . HTN (hypertension)   . Diabetes mellitus   . Cholelithiasis   . Hepatic steatosis   . Abdominal pain   . History of breast cancer 06/01/2012  . Arthralgia 08/13/2013  . Chest pain 09/30/2013   Past Surgical History  Procedure Laterality Date  . Tubal ligation    . Breast lumpectomy  2010   Allergies  Allergies  Allergen Reactions  . 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 She was on adjuvant hormonal therapy with tamoxifen started in August 2010. In March 2014: her treatment is switched to Femara. Her most recent CAT scan showed no evidence of metastatic disease. The same CT chest showed calcifications of her coronary vessels including left main and LAD.  The patient has been experiencing exertional dyspnea after walking a flight of stairs, that eventually progresses to chest pain on almost any exertion. This has been going on for last couple weeks. The patient describes her pain as someone sitting on her chest with radiation to her neck and palpitations. After she takes stress her pain resolves in about 10 minutes. She denies syncope orthopnea, paroxysmal nocturnal dyspnea, lower extremity edema. She has never smoked. No significant  family history of coronary artery disease.  She underwent a cardiac cath that showed moderate non-obstructive CAD. She reports improvement of symptoms.  Home Medications  Prior to Admission medications   Medication Sig Start Date End Date Taking? Authorizing Provider  ibuprofen (ADVIL,MOTRIN) 800 MG tablet Take 800 mg by mouth every 8 (eight) hours as needed for pain.   Yes Historical Provider, MD  letrozole (FEMARA) 2.5 MG tablet TAKE 1 TABLET BY MOUTH ONCE DAILY 09/30/13  Yes Artis Delay, MD  lisinopril-hydrochlorothiazide (PRINZIDE,ZESTORETIC) 10-12.5 MG per tablet Take 1 tablet by mouth daily.   Yes Historical Provider, MD  metFORMIN (GLUCOPHAGE) 500 MG tablet Take 500 mg by mouth 2 (two) times daily with a meal.   Yes Historical Provider, MD  Multiple Vitamins-Minerals (MULTIVITAMIN WITH MINERALS) tablet Take 1 tablet by mouth daily.   Yes Historical Provider, MD    Family History  Family History  Problem Relation Age of Onset  . Thyroid cancer Sister     thyroid    Social History  History   Social History  . Marital Status: Single    Spouse Name: N/A    Number of Children: N/A  . Years of Education: N/A   Occupational History  . Not on file.   Social History Main Topics  . Smoking status: Never Smoker   . Smokeless tobacco: Not on file  . Alcohol Use: No  . Drug Use: No  . Sexual Activity: Not Currently    Birth Control/ Protection: Surgical   Other Topics Concern  .  Not on file   Social History Narrative  . No narrative on file     Review of Systems, as per HPI, otherwise negative General:  No chills, fever, night sweats or weight changes.  Cardiovascular:  No chest pain, dyspnea on exertion, edema, orthopnea, palpitations, paroxysmal nocturnal dyspnea. Dermatological: No rash, lesions/masses Respiratory: No cough, dyspnea Urologic: No hematuria, dysuria Abdominal:   No nausea, vomiting, diarrhea, bright red blood per rectum, melena, or  hematemesis Neurologic:  No visual changes, wkns, changes in mental status. All other systems reviewed and are otherwise negative except as noted above.  Physical Exam  BP 124/70, HR 74 General: Pleasant, NAD Psych: Normal affect. Neuro: Alert and oriented X 3. Moves all extremities spontaneously. HEENT: Normal  Neck: Supple without bruits or JVD. Lungs:  Resp regular and unlabored, CTA. Heart: RRR no s3, s4, or murmurs. Abdomen: Soft, non-tender, non-distended, BS + x 4.  Extremities: No clubbing, cyanosis or edema. DP/PT/Radials 2+ and equal bilaterally.  Labs:  No results found for this basename: CKTOTAL, CKMB, TROPONINI,  in the last 72 hours Lab Results  Component Value Date   WBC 5.6 10/28/2013   HGB 13.4 10/28/2013   HCT 38.8 10/28/2013   MCV 88.3 10/28/2013   PLT 152.0 10/28/2013   No results found for this basename: NA, K, CL, CO2, BUN, CREATININE, CALCIUM, LABALBU, PROT, BILITOT, ALKPHOS, ALT, AST, GLUCOSE,  in the last 168 hours Lab Results  Component Value Date   CHOL 158 10/28/2013   HDL 39.20 10/28/2013   LDLCALC 86 10/28/2013   TRIG 165.0* 10/28/2013   Lab Results  Component Value Date   DDIMER 0.33 09/30/2013   No components found with this basename: POCBNP,   Accessory Clinical Findings  echocardiogram  ECG - normal sinus rhythm, 74 beats per minutes, normal EKG  Cardiac cath 10/30/13  Hemodynamics:  AO 123 73  LV 133 14  Coronary angiography:  Coronary dominance: right  Left mainstem: Normal  Left anterior descending (LAD): 30% calcified disease proximally  D1: 30--40% mid vessel disease  D2: 50% mid vessel disease  D3: small normal  Left circumflex (LCx): Normal  OM1: large vessel normal  Right coronary artery (RCA): Large dominant vessel normal  Left ventriculography: Left ventricular systolic function is normal, LVEF is estimated at 55-65%, there is no significant mitral regurgitation  Final Conclusions: Films reviewed with Dr [place] He  agrees high diagonal vessels are not flow limiting Continue to Rx risk factors  Appears to have noncardiac chest pain  Recommendations: Medical Rx  Charlton Haws  10/30/2013, 12:24 PM    Assessment & Plan  57 year old female  1. Typical exertional chest pain - in a patient with multiple risk factors including diabetes, uncontrolled hypertension, prior radiation therapy, and evidence of coronary calcification on chest CT scan. She underwent a cardiac cath that showed moderate non-obstructive CAD.  The patient is not on statin, her TAG is 165 and LDL 86, goal <70, considering CAD and DM< she should be on moderate to high dose statin. We will start atorvastatin 10 mg daily as she has h/o fatty liver and AST 50. We will repeat CMP in 1 month.  2. Hypertension - controlled after adding carvedilol 6.25 mg by mouth BID   3. Hyperlipidemia - as per 1.  CMP in 1 month Follow up in 2 months.   Lars Masson, MD, [facility] 11/07/2013, 3:06 PM

## 2013-12-02 ENCOUNTER — Other Ambulatory Visit: Payer: No Typology Code available for payment source

## 2013-12-06 ENCOUNTER — Encounter (HOSPITAL_COMMUNITY): Payer: Self-pay | Admitting: Emergency Medicine

## 2013-12-06 ENCOUNTER — Emergency Department (HOSPITAL_COMMUNITY)
Admission: EM | Admit: 2013-12-06 | Discharge: 2013-12-06 | Disposition: A | Discharge status: Home / Self Care | Payer: No Typology Code available for payment source | Source: Home / Self Care | Attending: Family Medicine | Admitting: Family Medicine

## 2013-12-06 DIAGNOSIS — E1140 Type 2 diabetes mellitus with diabetic neuropathy, unspecified: Secondary | ICD-10-CM | POA: Diagnosis present

## 2013-12-06 DIAGNOSIS — E1165 Type 2 diabetes mellitus with hyperglycemia: Secondary | ICD-10-CM

## 2013-12-06 DIAGNOSIS — IMO0001 Reserved for inherently not codable concepts without codable children: Secondary | ICD-10-CM

## 2013-12-06 DIAGNOSIS — I10 Essential (primary) hypertension: Secondary | ICD-10-CM

## 2013-12-06 DIAGNOSIS — IMO0002 Reserved for concepts with insufficient information to code with codable children: Secondary | ICD-10-CM

## 2013-12-06 LAB — POCT I-STAT, CHEM 8
BUN: 13 mg/dL (ref 6–23)
CREATININE: 0.7 mg/dL (ref 0.50–1.10)
Calcium, Ion: 1.25 mmol/L — ABNORMAL HIGH (ref 1.12–1.23)
Chloride: 101 mEq/L (ref 96–112)
Glucose, Bld: 243 mg/dL — ABNORMAL HIGH (ref 70–99)
HCT: 42 % (ref 36.0–46.0)
HEMOGLOBIN: 14.3 g/dL (ref 12.0–15.0)
POTASSIUM: 3.9 meq/L (ref 3.7–5.3)
Sodium: 139 mEq/L (ref 137–147)
TCO2: 24 mmol/L (ref 0–100)

## 2013-12-06 MED ORDER — LISINOPRIL-HYDROCHLOROTHIAZIDE 20-12.5 MG PO TABS: 1.0000 | ORAL_TABLET | Freq: Every day | ORAL | Status: DC

## 2013-12-06 MED ORDER — METFORMIN HCL 850 MG PO TABS: 850.0000 mg | ORAL_TABLET | Freq: Two times a day (BID) | ORAL | Status: DC

## 2013-12-06 NOTE — ED Notes (Signed)
C/o medication refill on metformin and lisinopril Has been out of meds for one week

## 2013-12-06 NOTE — ED Provider Notes (Signed)
Brandi Bryant is a 58 y.o. female who presents to Urgent Care today for refill of hypertension and diabetes medications. Patient is just about to run out of her lisinopril/hydrochlorothiazide and metformin. She is currently asymptomatic. She is an appointment with her primary care provider at [facility] in February. No chest pains palpitations nausea vomiting diarrhea. No polyuria or polydipsia. No syncope.   Past Medical History  Diagnosis Date  . Breast cancer   . HTN (hypertension)   . Diabetes mellitus   . Cholelithiasis   . Hepatic steatosis   . Abdominal pain   . History of breast cancer 06/01/2012  . Arthralgia 08/13/2013  . Chest pain 09/30/2013   History  Substance Use Topics  . Smoking status: Never Smoker   . Smokeless tobacco: Not on file  . Alcohol Use: No   ROS as above Medications: No current facility-administered medications for this encounter.   Current Outpatient Prescriptions  Medication Sig Dispense Refill  . aspirin EC 81 MG tablet Take 1 tablet (81 mg total) by mouth daily.  90 tablet  3  . atorvastatin (LIPITOR) 10 MG tablet Take 1 tablet (10 mg total) by mouth daily.  30 tablet  3  . buPROPion (WELLBUTRIN SR) 150 MG 12 hr tablet Take 150 mg by mouth daily.      Marland Kitchen ibuprofen (ADVIL,MOTRIN) 800 MG tablet Take 800 mg by mouth every 8 (eight) hours as needed for pain.      Marland Kitchen letrozole (FEMARA) 2.5 MG tablet Take 2.5 mg by mouth daily.      Marland Kitchen lisinopril-hydrochlorothiazide (PRINZIDE,ZESTORETIC) 20-12.5 MG per tablet Take 1 tablet by mouth daily.  30 tablet  1  . metFORMIN (GLUCOPHAGE) 850 MG tablet Take 1 tablet (850 mg total) by mouth 2 (two) times daily with a meal. Takes 850 mg in the morning and 425 mg in the evening  60 tablet  1  . Multiple Vitamins-Minerals (MULTIVITAMIN WITH MINERALS) tablet Take 1 tablet by mouth daily.      . [DISCONTINUED] escitalopram (LEXAPRO) 10 MG tablet Take 10 mg by mouth daily.        Exam:   BP 157/100  Pulse 63  Temp(Src) 98 F (36.7 C) (Oral)  Resp 18  SpO2 96% Gen: Well NAD HEENT:  MMM Lungs: Normal work of breathing. CTABL Heart: RRR no MRG Abd: NABS, Soft. NT, ND Exts: Brisk capillary refill, warm and well perfused.   Results for orders placed during the hospital encounter of 12/06/13 (from the past 24 hour(s))  POCT I-STAT, CHEM 8     Status: Abnormal   Collection Time    12/06/13 10:23 AM      Result Value Range   Sodium 139  137 - 147 mEq/L   Potassium 3.9  3.7 - 5.3 mEq/L   Chloride 101  96 - 112 mEq/L   BUN 13  6 - 23 mg/dL   Creatinine, Ser 0.70  0.50 - 1.10 mg/dL   Glucose, Bld 243 (*) 70 - 99 mg/dL   Calcium, Ion 1.25 (*) 1.12 - 1.23 mmol/L   TCO2 24  0 - 100 mmol/L   Hemoglobin 14.3  12.0 - 15.0 g/dL   HCT 42.0  36.0 - 46.0 %   No results found.  Assessment and Plan: 58 y.o. female with hypertension and diabetes. Neither particularly well-controlled. Plan refill hydrochlorothiazide/lisinopril and metformin. Encourage patient to followup with her primary care provider.  Discussed warning signs or symptoms. Please see discharge instructions.  Patient expresses understanding.    Gregor Hams, MD 12/06/13 [phone]

## 2013-12-06 NOTE — Discharge Instructions (Signed)
Thank you for coming in today. See your doctor at [facility] [address] [place], [place] [address] [phone] Continue metformin and hydrochlorothiazide/lisinopril. Call or go to the emergency room if you get worse, have trouble breathing, have chest pains, or palpitations.   Hipertensin arterial (Arterial Hypertension) La hipertensin arterial (presin sangunea elevada) es un trastorno que consiste en la elevacin de la presin dentro de sus vasos sanguneos. La hipertensin, con el correr del tiempo, favorece el riesgo de padecer ictus, infartos o insuficiencias cardacas. Tambin es una de las causas principales de insuficiencia renal (del rin).  CAUSAS  En adultos  Ms del 90% de todos los casos de hipertensin no tienen causas conocidas. Esto se denomina hipertensin esencial o primaria. En el 10% restante de personas con hipertensin, el aumento en la presin arterial es causado por otras enfermedades. Esto se denomina hipertensin secundaria. LAS CAUSAS MS IMPORTANTES DE HIPERTENSIN SECUDARIA SON:  Abuso en el consumo de bebidas alcohlicas.  Apnea del sueo obstructiva.  Hiperaldosteronismo (Sndrome de [place]).  Uso de esteroides.  Insuficiencia renal crnica.  Hiperparatiroidismo.  Medicamentos.  Estenosis de la arteria renal.  Feocromocitoma.  Enfermedad de [place].  Coartacin de la aorta.  Crisis de esclerodermia renal.  Regaliz (en cantidades excesivas).  Drogas (cocana, metanfetamina). El profesional que lo asiste le [employer] puntos mencionados arriba que pueden estar referidos a usted.  En nios  La hipertensin secundaria es la ms comn y siempre debe ser considerada como causa.  Embarazo  Pocas mujeres en edad de procrear tienen alta presin arterial. Sin embargo, hasta el 10% de ellas desarrollan hipertensin inducida por el embarazo. En general, esto no perjudicar a la mujer (benigno). Puede  ser un sntoma de 3 complicaciones del embarazo: preeclampsia, sndrome HELLP (por sus siglas en ingls) y eclampsia. Es necesario Chartered certified accountant seguimiento y Building surveyor con medicacin. SNTOMAS  Esta enfermedad normalmente no produce ningn sntoma perceptible. Generalmente se descubre en un examen de rutina.  La hipertensin maligna (hipertensin acelerada) es un problema (complicacin) tarda de la presin arterial elevada. Puede tener los siguientes sntomas:  Dolor de [place].  Visin borrosa.  Dao en rganos diana (significa que los riones, el corazn, los pulmones y otros rganos se estn daando).  Las situaciones de estrs pueden aumentar la presin arterial. Si una persona con presin arterial normal tiene un aumento en su presin en el momento en que es controlada por el profesional, se denomina "hipertensin de guardapolvo blanco". Su gravedad no se conoce. Puede estar relacionado con un desarrollo futuro de hipertensin, o con complicaciones de la hipertensin.  La hipertensin generalmente se confunde con tensin intelectual, estrs y ansiedad. DIAGNSTICO El diagnstico se realiza mediante 3 mediciones de presin sangunea individuales. Se toman con una diferencia de al menos una semana entre [place]. Si hay un rgano daado por causa de la hipertensin, el diagnstico puede realizarse sin repetir [employer]. La hipertensin normalmente se identifica (diagnostica) si se obtienen los siguientes niveles:  Ms de 140/90 mm Hg medidos en ambos brazos, en 3 mediciones distintas, a lo largo de [employer].  Ms de 130/80 mm Hg debe considerarse un factor de riesgo y puede requerir tratamiento en pacientes con diabetes. La presin arterial de ms de 120/80 mm Hg se denomina "pre-hipertensin", an en pacientes no diabticos. Para obtener una medicin precisa de la presin sangunea, siga las siguientes indicaciones. Sea consciente de los factores que pueden alterar las mediciones  de presin sangunea.  Tome la presin al menos una hora luego de consumir cafena.  Tome la presin 30 minutos luego de fumar y sin Psychologist, forensic. Este es otro motivo para dejar de fumar: aumenta la presin arterial.  Use un manguito de tamao adecuado. Consulte al profesional que lo asiste si no est seguro acerca del tamao que le corresponde.  La mayor parte de los tensimetros hogareos son automticos. Medirn la presin sistlica y diastlica. La presin sistlica es la que se mide al comienzo de los sonidos. La presin diastlica es la presin en la que los sonidos desaparecen. Si usted es [place], mida distintas presiones en distintas posiciones. Intente sentado, acostado o parado.  Sintese y descanse por al menos 5 minutos antes de [employer].  No debera tomar medicamentos como descongestionantes (estimulantes adrenrgicos) antes de Biomedical scientist. Estos se encuentran en muchos medicamentos para el resfro.  Tome nota de las mediciones de su presin sangunea e infrmeselas al profesional que lo asiste. Si usted tiene hipertensin:  El profesional que lo asiste podr Human resources officer para asegurarse de que no tenga hipertensin secundaria (ver "causas", arriba).  El profesional que lo asiste tambin podr buscar sntomas de Sndrome Metablico. Tambin se denomina Sndrome X o Sndrome de Insulinorresistencia. Podr tener este sndrome si, adems de hipertensin, tiene diabetes del tipo 2, obesidad abdominal, y lpidos sanguneos anormales.  El profesional que lo asiste tomar su historia mdica y familiar y Optometrist un examen fsico.  Las pruebas de diagnstico pueden incluir exmenes de sangre (de glucosa, [place], [place], y funcin del rin), un anlisis de [place], o Publishing rights manager. Puede ser necesario realizar otras pruebas segn su caso particular. PREVENCIN Existen algunos importantes consejos relacionados con su estilo de vida que puede adoptar para  reducir sus probabilidades de desarrollar hipertensin.  Mantenga un peso normal.  Limite la cantidad de sal (sodio) en su dieta.  Ejerctese regularmente.  Limite el consumo de alcohol.  Lleve una dieta con suficiente potasio. Converse consejos especficos con el profesional que lo asiste.  Haga un dieta DASH ( dieta que ayuda a frenar la hipertensin por sus siglas en ingls). Esta dieta es rica en frutas, vegetales, y productos lcteos descremados, y evita ciertos tipos de grasas. PRONSTICO: La hipertensin esencial no puede curarse. Los [employer] Strauss en el estilo de vida y el tratamiento mdico pueden disminuir la presin sangunea y reducir las complicaciones. El pronstico de la hipertensin secundaria depende de la causa subyacente. Muchas personas controlan su hipertensin con medicamentos o cambios en su estilo de vida y de esta forma pueden vivir una vida normal y saludable.  [place]. Mientras la presin arterial alta en s misma no es una enfermedad, a menudo requiere [employer] a los efectos a Production designer, theatre/television/film y Tree surgeon plazo que tiene sobre muchos rganos. La hipertensin aumenta el riesgo de padecer:  ACV o ictus (accidentes cerebrovasculares)  Insuficiencia cardaca debido a la presin arterial elevada crnica (Cardiomiopata hipertensiva).  Ataques cardacos (infarto del miocardio).  Lesin en la retina (retinopata hipertensiva).  Insuficiencia renal (nefropata hipertensiva). El profesional que lo asiste le [employer] puntos de esta lista que pueden estar referidos a usted. El tratamiento de la hipertensin puede reducir significativamente el riesgo de complicaciones. TRATAMIENTO  Se recomienda la prdida de peso para los pacientes excedidos y la prctica regular de ejercicio. El buen estado fsico reduce la presin arterial.  La hipertensin leve generalmente se trata con dieta y ejercicio. Una dieta rica en frutas y vegetales, lcteos descremados y  alimentos bajos en grasas y sal (sodio) pueden ayudar a disminuir la presin arterial. La disminucin del consumo de sal disminuye la presin arterial en un tercio de [employer].  Si es fumador, abandone el hbito. Estos pasos son muy efectivos para reducir la presin arterial. Estas acciones a seguir son fciles de sugerir pero difciles de llevar a cabo. La mayora de los pacientes con hipertensin moderada o grave finalmente necesita medicamentos para retornar la presin a Development worker, community. Hay varios tipos de medicamentos que se emplean en el tratamiento. Las pastillas para la presin arterial (antihipertensivos) la disminuyen a travs de sus diferentes acciones. La disminucin de la presin arterial en 10 mm Hg puede disminuir el riesgo de complicaciones en un 25 %. El objetivo del tratamiento es el control efectivo de la presin arterial. Esto reducir el riesgo de complicaciones. El profesional que lo asiste le ayudar a Leisure centre manager tratamiento para usted, de acuerdo con su estilo de vida. Lo que puede ser un excelente tratamiento para Ardelia Mems persona, puede no serlo para [place]. INSTRUCCIONES PARA EL CUIDADO DOMICILIARIO  NO fume.  Adapte su estilo de vida de acuerdo a las indicaciones de la seccin "Prevencin."  Si est tomando medicamentos, siga las indicaciones cuidadosamente. Los medicamentos para la presin arterial deben tomarse segn fueron prescritos. Saltear dosis reduce sus beneficios. Tambin lo pone en riesgo de padecer complicaciones.  Concurra a las consultas de seguimiento con el profesional que lo asiste, segn le haya indicado.  Si se le solicita que controle su presin arterial en su hogar, siga las anteriores indicaciones de la seccin "diagnstico." SOLICITE ATENCIN MDICA SI:  Cree que est sufriendo efectos secundarios de la medicacin.  Siente dolores de [place] recurrentes o se siente mareado.  Siente hinchazn en sus tobillos.  Tiene problemas  visuales. SOLICITE ATENCIN MDICA DE INMEDIATO SI:  Comienza sbitamente a sentir dolor o presin en el pecho, dificultad para respirar, u otros sntomas de ataque cardaco.  Sufre una cefalea grave.  Tiene sntomas de ataque cardaco (debilidad repentina, dificultad para hablar, dificultad para caminar). EST SEGURO QUE:   Comprende las instrucciones para el alta mdica.  Controlar su enfermedad.  Solicitar atencin mdica de inmediato segn las indicaciones. Document Released: 11/07/2005 Document Revised: 01/30/2012 [facility] Patient Information 2014 [place], [place].

## 2014-01-07 ENCOUNTER — Ambulatory Visit: Payer: Self-pay | Admitting: Cardiology

## 2014-01-10 ENCOUNTER — Telehealth: Payer: Self-pay | Admitting: Hematology and Oncology

## 2014-01-10 NOTE — Telephone Encounter (Signed)
, °

## 2014-01-13 ENCOUNTER — Ambulatory Visit: Payer: Self-pay | Admitting: Internal Medicine

## 2014-01-15 ENCOUNTER — Emergency Department (HOSPITAL_COMMUNITY): Payer: No Typology Code available for payment source

## 2014-01-15 ENCOUNTER — Emergency Department (HOSPITAL_COMMUNITY)
Admission: EM | Admit: 2014-01-15 | Discharge: 2014-01-16 | Disposition: A | Discharge status: Home / Self Care | Payer: No Typology Code available for payment source | Attending: Emergency Medicine | Admitting: Emergency Medicine

## 2014-01-15 ENCOUNTER — Encounter (HOSPITAL_COMMUNITY): Payer: Self-pay | Admitting: Emergency Medicine

## 2014-01-15 DIAGNOSIS — IMO0001 Reserved for inherently not codable concepts without codable children: Secondary | ICD-10-CM

## 2014-01-15 DIAGNOSIS — I6350 Cerebral infarction due to unspecified occlusion or stenosis of unspecified cerebral artery: Secondary | ICD-10-CM

## 2014-01-15 DIAGNOSIS — E119 Type 2 diabetes mellitus without complications: Secondary | ICD-10-CM | POA: Insufficient documentation

## 2014-01-15 DIAGNOSIS — R4789 Other speech disturbances: Secondary | ICD-10-CM | POA: Insufficient documentation

## 2014-01-15 DIAGNOSIS — I10 Essential (primary) hypertension: Secondary | ICD-10-CM | POA: Insufficient documentation

## 2014-01-15 DIAGNOSIS — Z8719 Personal history of other diseases of the digestive system: Secondary | ICD-10-CM | POA: Insufficient documentation

## 2014-01-15 DIAGNOSIS — I2510 Atherosclerotic heart disease of native coronary artery without angina pectoris: Secondary | ICD-10-CM | POA: Insufficient documentation

## 2014-01-15 DIAGNOSIS — I639 Cerebral infarction, unspecified: Secondary | ICD-10-CM

## 2014-01-15 DIAGNOSIS — Z8673 Personal history of transient ischemic attack (TIA), and cerebral infarction without residual deficits: Secondary | ICD-10-CM

## 2014-01-15 DIAGNOSIS — Z7982 Long term (current) use of aspirin: Secondary | ICD-10-CM | POA: Insufficient documentation

## 2014-01-15 DIAGNOSIS — M25519 Pain in unspecified shoulder: Secondary | ICD-10-CM | POA: Insufficient documentation

## 2014-01-15 DIAGNOSIS — Z79899 Other long term (current) drug therapy: Secondary | ICD-10-CM | POA: Insufficient documentation

## 2014-01-15 DIAGNOSIS — E1165 Type 2 diabetes mellitus with hyperglycemia: Secondary | ICD-10-CM

## 2014-01-15 DIAGNOSIS — R29810 Facial weakness: Secondary | ICD-10-CM

## 2014-01-15 DIAGNOSIS — Z86718 Personal history of other venous thrombosis and embolism: Secondary | ICD-10-CM | POA: Insufficient documentation

## 2014-01-15 DIAGNOSIS — Z853 Personal history of malignant neoplasm of breast: Secondary | ICD-10-CM | POA: Insufficient documentation

## 2014-01-15 DIAGNOSIS — I252 Old myocardial infarction: Secondary | ICD-10-CM | POA: Insufficient documentation

## 2014-01-15 DIAGNOSIS — M6281 Muscle weakness (generalized): Secondary | ICD-10-CM | POA: Insufficient documentation

## 2014-01-15 LAB — I-STAT CHEM 8, ED
BUN: 12 mg/dL (ref 6–23)
Calcium, Ion: 1.16 mmol/L (ref 1.12–1.23)
Chloride: 104 mEq/L (ref 96–112)
Creatinine, Ser: 0.7 mg/dL (ref 0.50–1.10)
Glucose, Bld: 234 mg/dL — ABNORMAL HIGH (ref 70–99)
HEMATOCRIT: 41 % (ref 36.0–46.0)
Hemoglobin: 13.9 g/dL (ref 12.0–15.0)
Potassium: 3.3 mEq/L — ABNORMAL LOW (ref 3.7–5.3)
Sodium: 140 mEq/L (ref 137–147)
TCO2: 23 mmol/L (ref 0–100)

## 2014-01-15 LAB — APTT: aPTT: 27 seconds (ref 24–37)

## 2014-01-15 LAB — I-STAT TROPONIN, ED: TROPONIN I, POC: 0.01 ng/mL (ref 0.00–0.08)

## 2014-01-15 LAB — COMPREHENSIVE METABOLIC PANEL
ALT: 62 U/L — ABNORMAL HIGH (ref 0–35)
AST: 116 U/L — AB (ref 0–37)
Albumin: 3.9 g/dL (ref 3.5–5.2)
Alkaline Phosphatase: 117 U/L (ref 39–117)
BUN: 13 mg/dL (ref 6–23)
CALCIUM: 9.3 mg/dL (ref 8.4–10.5)
CO2: 22 mEq/L (ref 19–32)
Chloride: 100 mEq/L (ref 96–112)
Creatinine, Ser: 0.73 mg/dL (ref 0.50–1.10)
GFR calc Af Amer: >90 mL/min (ref >90)
Glucose, Bld: 225 mg/dL — ABNORMAL HIGH (ref 70–99)
Potassium: 3.6 mEq/L — ABNORMAL LOW (ref 3.7–5.3)
Sodium: 141 mEq/L (ref 137–147)
Total Bilirubin: 0.5 mg/dL (ref 0.3–1.2)
Total Protein: 7.9 g/dL (ref 6.0–8.3)

## 2014-01-15 LAB — DIFFERENTIAL
BASOS ABS: 0 10*3/uL (ref 0.0–0.1)
Basophils Relative: 0 % (ref 0–1)
Eosinophils Absolute: 0.2 10*3/uL (ref 0.0–0.7)
Eosinophils Relative: 3 % (ref 0–5)
Lymphocytes Relative: 33 % (ref 12–46)
Lymphs Abs: 2.2 10*3/uL (ref 0.7–4.0)
Monocytes Absolute: 0.6 10*3/uL (ref 0.1–1.0)
Monocytes Relative: 9 % (ref 3–12)
Neutro Abs: 3.6 10*3/uL (ref 1.7–7.7)
Neutrophils Relative %: 55 % (ref 43–77)

## 2014-01-15 LAB — CBC
HEMATOCRIT: 38.4 % (ref 36.0–46.0)
Hemoglobin: 13.7 g/dL (ref 12.0–15.0)
MCH: 30.7 pg (ref 26.0–34.0)
MCHC: 35.7 g/dL (ref 30.0–36.0)
MCV: 86.1 fL (ref 78.0–100.0)
Platelets: 164 10*3/uL (ref 150–400)
RBC: 4.46 MIL/uL (ref 3.87–5.11)
RDW: 13 % (ref 11.5–15.5)
WBC: 6.5 10*3/uL (ref 4.0–10.5)

## 2014-01-15 LAB — PROTIME-INR
INR: 1.07 (ref 0.00–1.49)
Prothrombin Time: 13.7 seconds (ref 11.6–15.2)

## 2014-01-15 LAB — CBG MONITORING, ED: GLUCOSE-CAPILLARY: 188 mg/dL — AB (ref 70–99)

## 2014-01-15 MED ORDER — IOHEXOL 350 MG/ML SOLN
50.0000 mL | Freq: Once | INTRAVENOUS | Status: AC | PRN
  Administered 2014-01-15: 50 mL via INTRAVENOUS

## 2014-01-15 MED ORDER — HYDROMORPHONE HCL PF 1 MG/ML IJ SOLN
1.0000 mg | Freq: Once | INTRAMUSCULAR | Status: AC
  Administered 2014-01-15: 1 mg via INTRAVENOUS
  Filled 2014-01-15: qty 1

## 2014-01-15 MED ORDER — FENTANYL CITRATE 0.05 MG/ML IJ SOLN
50.0000 ug | Freq: Once | INTRAMUSCULAR | Status: AC
  Administered 2014-01-15: 50 ug via INTRAVENOUS
  Filled 2014-01-15: qty 2

## 2014-01-15 MED ORDER — ONDANSETRON HCL 4 MG/2ML IJ SOLN
4.0000 mg | Freq: Once | INTRAMUSCULAR | Status: AC
  Administered 2014-01-15: 4 mg via INTRAVENOUS
  Filled 2014-01-15: qty 2

## 2014-01-15 NOTE — ED Provider Notes (Signed)
CSN: [id number]     Arrival date & time 01/15/14  1739 History   First MD Initiated Contact with Patient 01/15/14 1803     Chief Complaint  Patient presents with  . Cerebrovascular Accident     HPI Patient presents with concern of new facial droop, new weakness, new pain. Patient's symptoms began 2 days ago with dysesthesia in the right face.  Subsequently she developed weakness in the right side, greater than the right upper extremity.  There is concurrent pain in the right shoulder.  Prior to the onset of symptoms patient states that she was in her usual state of health. Patient does have multiple medical problems, states that she takes medication inconsistently. She denies a history of a stroke, blood clots, heart attack. She does have history of CAD. Since onset no clear alleviating or exacerbating factors, and the pain is sore, nonradiating, though it is worse with motion.  Past Medical History  Diagnosis Date  . Breast cancer   . HTN (hypertension)   . Diabetes mellitus   . Cholelithiasis   . Hepatic steatosis   . Abdominal pain   . History of breast cancer 06/01/2012  . Arthralgia 08/13/2013  . Chest pain 09/30/2013  . Stroke 2006   Past Surgical History  Procedure Laterality Date  . Tubal ligation    . Breast lumpectomy  2010   Family History  Problem Relation Age of Onset  . Thyroid cancer Sister     thyroid   History  Substance Use Topics  . Smoking status: Never Smoker   . Smokeless tobacco: Not on file  . Alcohol Use: No   OB History   Grav Para Term Preterm Abortions TAB SAB Ect Mult Living   6 6 6       6      Review of Systems  Constitutional:       Per HPI, otherwise negative  HENT:       Per HPI, otherwise negative  Respiratory:       Per HPI, otherwise negative  Cardiovascular:       Per HPI, otherwise negative  Gastrointestinal: Negative for vomiting.  Endocrine:       Negative aside from HPI  Genitourinary:       Neg aside from HPI    Musculoskeletal:       Per HPI, otherwise negative  Skin: Negative.   Neurological: Positive for weakness and headaches. Negative for syncope.      Allergies  Gadolinium derivatives  Home Medications   Current Outpatient Rx  Name  Route  Sig  Dispense  Refill  . aspirin EC 81 MG tablet   Oral   Take 81 mg by mouth daily.         Marland Kitchen atorvastatin (LIPITOR) 10 MG tablet   Oral   Take 10 mg by mouth daily.         Marland Kitchen buPROPion (WELLBUTRIN SR) 150 MG 12 hr tablet   Oral   Take 150 mg by mouth daily.         . calcium-vitamin D (OSCAL WITH D) 500-200 MG-UNIT per tablet   Oral   Take 1 tablet by mouth daily with breakfast.         . ibuprofen (ADVIL,MOTRIN) 800 MG tablet   Oral   Take 800 mg by mouth every 8 (eight) hours as needed for pain.         Marland Kitchen letrozole (FEMARA) 2.5 MG tablet   Oral  Take 2.5 mg by mouth daily.         Marland Kitchen lisinopril-hydrochlorothiazide (PRINZIDE,ZESTORETIC) 20-12.5 MG per tablet   Oral   Take 1 tablet by mouth daily.         . metFORMIN (GLUCOPHAGE) 850 MG tablet   Oral   Take 425-850 mg by mouth 2 (two) times daily with a meal. Takes 850mg  in the morning and 425mg  in the evening         . Multiple Vitamins-Minerals (MULTIVITAMIN WITH MINERALS) tablet   Oral   Take 1 tablet by mouth daily.          BP 142/63  Pulse 69  Temp(Src) 98 F (36.7 C) (Oral)  Resp 20  SpO2 97% Physical Exam  Nursing note and vitals reviewed. Constitutional: She is oriented to person, place, and time. She appears well-developed and well-nourished. No distress.  HENT:  Head: Normocephalic and atraumatic.  Right-sided facial droop, with mild dysphasia.  No other evidence of trauma, abnormalities.  Eyes: Conjunctivae and EOM are normal.  Neck:  Patient denies pain with range of motion of the neck.  Cardiovascular: Normal rate and regular rhythm.   Pulmonary/Chest: Effort normal and breath sounds normal. No stridor. No respiratory distress.   Abdominal: She exhibits no distension.  Musculoskeletal: She exhibits no edema.  No gross musculoskeletal abnormalities in the right side or anywhere.  There is tenderness to palpation about the right shoulder, anteriorly.  Neurological: She is alert and oriented to person, place, and time. A cranial nerve deficit is present. No sensory deficit.  Patient's strength is difficult to assess as the patient has hesitancy to participate fully in the neurologic exam.  With working changes the strength is 5/5 in the right upper and lower extremities, though the patient complains of pain in the right proximal upper extremity with testing.  Skin: Skin is warm and dry.  Psychiatric: She has a normal mood and affect.    ED Course  Procedures (including critical care time) Labs Review Labs Reviewed  COMPREHENSIVE METABOLIC PANEL - Abnormal; Notable for the following:    Potassium 3.6 (*)    Glucose, Bld 225 (*)    AST 116 (*)    ALT 62 (*)    All other components within normal limits  CBG MONITORING, ED - Abnormal; Notable for the following:    Glucose-Capillary 188 (*)    All other components within normal limits  I-STAT CHEM 8, ED - Abnormal; Notable for the following:    Potassium 3.3 (*)    Glucose, Bld 234 (*)    All other components within normal limits  PROTIME-INR  APTT  CBC  DIFFERENTIAL  I-STAT TROPOININ, ED   Imaging Review Ct Head (brain) Wo Contrast  01/15/2014   CLINICAL DATA:  Severe headaches  EXAM: CT HEAD WITHOUT CONTRAST  TECHNIQUE: Contiguous axial images were obtained from the base of the skull through the vertex without intravenous contrast.  COMPARISON:  June 22, 2006  FINDINGS: There is chronic diffuse atrophy. There is mild bilateral periventricular white matter small vessel ischemic change. There is low density in the left occipital lobe with loss of gray white differentiation suspicious for acute to subacute infarction. There is no midline shift or hydrocephalus.  There is no acute hemorrhage. There is an old left basal ganglia lacune are infarction without change. The bony calvarium is intact. There is minimal mucoperiosteal thickening of the right ethmoid sinus.  IMPRESSION: There is low density in the left occipital lobe  with loss of gray white differentiation suspicious for acute to subacute infarction.   Electronically Signed   By: Abelardo Diesel M.D.   On: 01/15/2014 18:29   Mr Brain Wo Contrast  01/15/2014   CLINICAL DATA:  Gradual onset of right-sided numbness, right facial droop, and right-sided weakness. The patient noted pain prior to the onset of these symptoms.  EXAM: MRI HEAD WITHOUT CONTRAST  TECHNIQUE: Multiplanar, multiecho pulse sequences of the brain and surrounding structures were obtained without intravenous contrast.  COMPARISON:  CT head without contrast 01/15/2014 and MRI brain 07/08/2006.  FINDINGS: The diffusion-weighted images demonstrate no evidence for acute or subacute infarction. The left occipital lobe infarct is new since 2007, but is not acute. There are some remote blood products associated with the infarct.  No acute hemorrhage or mass lesion is present. There is slight progression and periventricular and subcortical white matter disease since the prior exam. The ventricles are of normal size. No significant extra-axial fluid collection is present.  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 Flow is present in the major intracranial arteries. A fluid level is present in the right maxillary sinus. There is scattered opacification of right-sided ethmoid air cells. The frontal sinuses are clear. The sphenoid sinuses are clear. The mastoid air cells are clear. The globes and orbits are intact.  IMPRESSION: 1. No acute intracranial abnormality. 2. Left occipital lobe infarct is new since 2007 but is not acute. There are some remote punctate blood products associated with this infarct. 3. Slight progression in periventricular and  subcortical white matter disease, likely reflecting progressive microvascular ischemia.   Electronically Signed   By: Lawrence Santiago M.D.   On: 01/15/2014 19:57    EKG Interpretation    Date/Time:  Wednesday January 15 2014 17:43:16 EST Ventricular Rate:  83 PR Interval:  144 QRS Duration: 98 QT Interval:  384 QTC Calculation: 451 R Axis:   1 Text Interpretation:  Normal sinus rhythm Normal ECG Sinus rhythm Normal ECG Confirmed by Carmin Muskrat  MD [phone]) on 01/15/2014 9:29:44 PM           After the initial evaluation, CT scan, discussed the patient's case with our neurology colleagues. CTA ordered to r/o dissection.   MDM  Presents with concern of right facial weakness and pain in the right extremities.  The patient's neurologic exam is largely reassuring other than the right facial droop accounting for the patient's pain and is restricting her ability to elevate her right arm.  Patient's CT scan, MRI, CTA did not demonstrate acute stroke, or dissection.  Patient does have evidence of prior stroke, chronic hypertensive issues.  After discussing the patient's case with our neurology colleagues, patient has diagnosis of Bell's palsy.  Patient currently takes aspirin, will followup as an outpatient for additional evaluation and management, but is stable to do so as an outpatient given the largely reassuring imaging studies.  Carmin Muskrat, MD 01/16/14 [phone]

## 2014-01-15 NOTE — ED Notes (Signed)
CT called to inform of adequate IV being placed.

## 2014-01-15 NOTE — ED Notes (Signed)
Pt reports (with assistance of spanish interpretation by RN) last felt normal yesterday; started to have pain on right side of body and then this AM at 9 had a R sided facial droop; then around 12 noon started to thave numbness in R side; pt noted to facial droop; R sided grip weakness; speech normal; no drift

## 2014-01-15 NOTE — ED Notes (Signed)
Dr Wilson Singer at bedside; Angus Palms RN at bedside for assistance with interpretation

## 2014-01-15 NOTE — ED Notes (Signed)
Pt sts that she felt her tongue fall asleep on her last night.

## 2014-01-15 NOTE — Consult Note (Signed)
Reason for Consult:Right facial droop Referring Physician: Vanita Panda  CC: Right facial droop  HPI: Brandi Bryant is an 58 y.o. female who reports that she has recently had a viral illness.  On yesterday began to have some pain on the right side of her body.  This morning she awakened with neck pain that started at her ear on the right, went into the right side of her neck and then into the right arm.  Patient noted that the right side of her face drooped.  She feels numb on the right side of her face and in her mouth.  Her taste has been affected.  She has tearing from the right eye and feels a fullness in her right ear.  She feels her right side is weak as well.  Her symptoms did not resolve as the day progressed and she presented for evaluation.    Past Medical History  Diagnosis Date  . Breast cancer   . HTN (hypertension)   . Diabetes mellitus   . Cholelithiasis   . Hepatic steatosis   . Abdominal pain   . History of breast cancer 06/01/2012  . Arthralgia 08/13/2013  . Chest pain 09/30/2013  . Stroke 2006    Past Surgical History  Procedure Laterality Date  . Tubal ligation    . Breast lumpectomy  2010    Family History  Problem Relation Age of Onset  . Thyroid cancer Sister     thyroid    Social History:  reports that she has never smoked. She does not have any smokeless tobacco history on file. She reports that she does not drink alcohol or use illicit drugs.  Allergies  Allergen Reactions  . Gadolinium Derivatives     Code: VOM, Desc: Pt began vomiting 45 sec after MRI contrast injection of Multihance, Onset Date: [id number]      Medications: I have reviewed the patient's current medications. Prior to Admission:  Current outpatient prescriptions: aspirin EC 81 MG tablet, Take 81 mg by mouth daily., Disp: , Rfl: ;   atorvastatin (LIPITOR) 10 MG tablet, Take 10 mg by mouth daily., Disp: , Rfl: ;   buPROPion (WELLBUTRIN SR) 150 MG 12 hr tablet, Take 150 mg by  mouth daily., Disp: , Rfl: ;  calcium-vitamin D (OSCAL WITH D) 500-200 MG-UNIT per tablet, Take 1 tablet by mouth daily with breakfast., Disp: , Rfl:  ibuprofen (ADVIL,MOTRIN) 800 MG tablet, Take 800 mg by mouth every 8 (eight) hours as needed for pain., Disp: , Rfl: ;   letrozole (FEMARA) 2.5 MG tablet, Take 2.5 mg by mouth daily., Disp: , Rfl: ;   lisinopril-hydrochlorothiazide (PRINZIDE,ZESTORETIC) 20-12.5 MG per tablet, Take 1 tablet by mouth daily., Disp: , Rfl:  metFORMIN (GLUCOPHAGE) 850 MG tablet, Take 425-850 mg by mouth 2 (two) times daily with a meal. Takes 850mg  in the morning and 425mg  in the evening, Disp: , Rfl: ;   Multiple Vitamins-Minerals (MULTIVITAMIN WITH MINERALS) tablet, Take 1 tablet by mouth daily., Disp: , Rfl: ;  escitalopram (LEXAPRO) 10 MG tablet, Take 10 mg by mouth daily., Disp: , Rfl:   ROS: History obtained from the patient  General ROS: as noted in HPI Psychological ROS: negative for - behavioral disorder, hallucinations, memory difficulties, mood swings or suicidal ideation Ophthalmic ROS: eyes feel heavy ENT ROS: negative for - epistaxis, nasal discharge, oral lesions, sore throat, tinnitus or vertigo Allergy and Immunology ROS: negative for - hives or itchy/watery eyes Hematological and Lymphatic ROS: negative for - bleeding  problems, bruising or swollen lymph nodes Endocrine ROS: negative for - galactorrhea, hair pattern changes, polydipsia/polyuria or temperature intolerance Respiratory ROS: negative for - cough, hemoptysis, shortness of breath or wheezing Cardiovascular ROS: negative for - chest pain, dyspnea on exertion, edema or irregular heartbeat Gastrointestinal ROS: negative for - abdominal pain, diarrhea, hematemesis, nausea/vomiting or stool incontinence Genito-Urinary ROS: negative for - dysuria, hematuria, incontinence or urinary frequency/urgency Musculoskeletal ROS: as noted in HPI Neurological ROS: as noted in HPI Dermatological ROS:  negative for rash and skin lesion changes  Physical Examination: Blood pressure 142/63, pulse 69, temperature 98 F (36.7 C), temperature source Oral, resp. rate 20, SpO2 97.00%.  Neurologic Examination Mental Status: Alert, oriented, thought content appropriate.  Speech fluent without evidence of aphasia.  Able to follow 3 step commands without difficulty. Cranial Nerves: II: Discs flat bilaterally; Visual fields grossly normal, pupils equal, round, reactive to light and accommodation III,IV, VI: ptosis not present, extra-ocular motions intact bilaterally V,VII: right peripheral seventh nerve palsy, facial light touch sensation decreased on the right splitting the midline VIII: hearing normal bilaterally IX,X: gag reflex present XI: bilateral shoulder shrug XII: midline tongue extension Motor: Right : Upper extremity   5/5 with giveway weakness    Left:     Upper extremity   5/5  Lower extremity   5/5         Lower extremity   5/5 No pronotor drift Tone and bulk:normal tone throughout; no atrophy noted Sensory: Pinprick and light touch intact throughout, bilaterally Deep Tendon Reflexes: 2+ and symmetric throughout Plantars: Right: downgoing   Left: downgoing Cerebellar: normal finger-to-nose and normal heel-to-shin test Gait: unable to test CV: pulses palpable throughout     Laboratory Studies:   Basic Metabolic Panel:  Recent Labs Lab 01/15/14 1757 01/15/14 1820  NA 141 140  K 3.6* 3.3*  CL 100 104  CO2 22  --   GLUCOSE 225* 234*  BUN 13 12  CREATININE 0.73 0.70  CALCIUM 9.3  --     Liver Function Tests:  Recent Labs Lab 01/15/14 1757  AST 116*  ALT 62*  ALKPHOS 117  BILITOT 0.5  PROT 7.9  ALBUMIN 3.9   No results found for this basename: LIPASE, AMYLASE,  in the last 168 hours No results found for this basename: AMMONIA,  in the last 168 hours  CBC:  Recent Labs Lab 01/15/14 1757 01/15/14 1820  WBC 6.5  --   NEUTROABS 3.6  --   HGB 13.7  13.9  HCT 38.4 41.0  MCV 86.1  --   PLT 164  --     Cardiac Enzymes: No results found for this basename: CKTOTAL, CKMB, CKMBINDEX, TROPONINI,  in the last 168 hours  BNP: No components found with this basename: POCBNP,   CBG:  Recent Labs Lab 01/15/14 1956  GLUCAP 188*    Microbiology: Results for orders placed during the hospital encounter of 01/31/12  URINE CULTURE     Status: None   Collection Time    01/31/12 11:00 AM      Result Value Ref Range Status   Specimen Description URINE, RANDOM   Final   Special Requests rocephin Normal ADDED ON [id number] @2131    Final   Culture  Setup Time [id number]   Final   Colony Count >=100,000 COLONIES/ML   Final   Culture ESCHERICHIA COLI   Final   Report Status 02/02/2012 FINAL   Final   Organism ID, Bacteria ESCHERICHIA COLI   Final  Coagulation Studies:  Recent Labs  01/15/14 1757  LABPROT 13.7  INR 1.07    Urinalysis: No results found for this basename: COLORURINE, APPERANCEUR, LABSPEC, PHURINE, GLUCOSEU, HGBUR, BILIRUBINUR, KETONESUR, PROTEINUR, UROBILINOGEN, NITRITE, LEUKOCYTESUR,  in the last 168 hours  Lipid Panel:     Component Value Date/Time   CHOL 158 10/28/2013 1140   TRIG 165.0* 10/28/2013 1140   HDL 39.20 10/28/2013 1140   CHOLHDL 4 10/28/2013 1140   VLDL 33.0 10/28/2013 1140   LDLCALC 86 10/28/2013 1140    HgbA1C:  Lab Results  Component Value Date   HGBA1C 7.5* 01/31/2012    Urine Drug Screen:   No results found for this basename: labopia, cocainscrnur, labbenz, amphetmu, thcu, labbarb    Alcohol Level: No results found for this basename: ETH,  in the last 168 hours  Other results: EKG: normal sinus rhythm at 83 bpm.  Imaging: Ct Head (brain) Wo Contrast  01/15/2014   CLINICAL DATA:  Severe headaches  EXAM: CT HEAD WITHOUT CONTRAST  TECHNIQUE: Contiguous axial images were obtained from the base of the skull through the vertex without intravenous contrast.  COMPARISON:  June 22, 2006  FINDINGS:  There is chronic diffuse atrophy. There is mild bilateral periventricular white matter small vessel ischemic change. There is low density in the left occipital lobe with loss of gray white differentiation suspicious for acute to subacute infarction. There is no midline shift or hydrocephalus. There is no acute hemorrhage. There is an old left basal ganglia lacune are infarction without change. The bony calvarium is intact. There is minimal mucoperiosteal thickening of the right ethmoid sinus.  IMPRESSION: There is low density in the left occipital lobe with loss of gray white differentiation suspicious for acute to subacute infarction.   Electronically Signed   By: Abelardo Diesel M.D.   On: 01/15/2014 18:29   Mr Brain Wo Contrast  01/15/2014   CLINICAL DATA:  Gradual onset of right-sided numbness, right facial droop, and right-sided weakness. The patient noted pain prior to the onset of these symptoms.  EXAM: MRI HEAD WITHOUT CONTRAST  TECHNIQUE: Multiplanar, multiecho pulse sequences of the brain and surrounding structures were obtained without intravenous contrast.  COMPARISON:  CT head without contrast 01/15/2014 and MRI brain 07/08/2006.  FINDINGS: The diffusion-weighted images demonstrate no evidence for acute or subacute infarction. The left occipital lobe infarct is new since 2007, but is not acute. There are some remote blood products associated with the infarct.  No acute hemorrhage or mass lesion is present. There is slight progression and periventricular and subcortical white matter disease since the prior exam. The ventricles are of normal size. No significant extra-axial fluid collection is present.  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 Flow is present in the major intracranial arteries. A fluid level is present in the right maxillary sinus. There is scattered opacification of right-sided ethmoid air cells. The frontal sinuses are clear. The sphenoid sinuses are clear. The mastoid air  cells are clear. The globes and orbits are intact.  IMPRESSION: 1. No acute intracranial abnormality. 2. Left occipital lobe infarct is new since 2007 but is not acute. There are some remote punctate blood products associated with this infarct. 3. Slight progression in periventricular and subcortical white matter disease, likely reflecting progressive microvascular ischemia.   Electronically Signed   By: Lawrence Santiago M.D.   On: 01/15/2014 19:57     Assessment/Plan: 58 year old female with presenting complaints of right sided facial droop and  right sided pain.  Neurological examination suggests a Bell's Palsy.  Extremity weakness on examination has multiple functional features and may be related to complaints of pain.  Patient does have multiple vascular risk factors, though, including hypertension, hyperlipidemia and diabetes and has been noncompliant with medications.  MRI of the brain was reviewed and shows an old left occipital lobe infarct that the patient is asymptomatic from.  No acute abnormalities were noted.  Although patient is in need of a stroke work up with no evidence of acute ischemic disease this would not be required to be performed as an inpatient.  Recommendations: 1.  Continue ASA daily 2.  Antiviral and prednisone for treatment of Bell's palsy 3.  Due to neck pain would consider CTA of head.  Doubt dissection, particularly since neurologic symptoms are on the same side as the pain. 4.  Patient to follow up with PCP where risk factors can be managed and echocardiogram can be performed.  Patient can also have follow up of Bell's as an outpatient   Case discussed with Dr. Kristine Garbe, MD [facility] [phone] 01/15/2014, 9:57 PM

## 2014-01-15 NOTE — ED Notes (Signed)
Patient became nauseated again during CT scan. Zofran administered.

## 2014-01-16 MED ORDER — PREDNISONE 20 MG PO TABS
60.0000 mg | ORAL_TABLET | ORAL | Status: AC
  Administered 2014-01-16: 60 mg via ORAL
  Filled 2014-01-16 (×3): qty 3

## 2014-01-16 MED ORDER — ACYCLOVIR 200 MG PO CAPS: 400.0000 mg | ORAL_CAPSULE | Freq: Every day | ORAL | Status: DC

## 2014-01-16 MED ORDER — HYDROCODONE-ACETAMINOPHEN 5-325 MG PO TABS
1.0000 | ORAL_TABLET | Freq: Four times a day (QID) | ORAL | Status: DC | PRN
Start: 2014-01-16 — End: 2014-01-20

## 2014-01-16 MED ORDER — DIAZEPAM 5 MG/ML IJ SOLN
5.0000 mg | Freq: Once | INTRAMUSCULAR | Status: AC
  Administered 2014-01-16: 5 mg via INTRAVENOUS
  Filled 2014-01-16: qty 2

## 2014-01-16 MED ORDER — ONDANSETRON HCL 4 MG/2ML IJ SOLN
4.0000 mg | Freq: Once | INTRAMUSCULAR | Status: AC
  Administered 2014-01-16: 4 mg via INTRAVENOUS
  Filled 2014-01-16: qty 2

## 2014-01-16 MED ORDER — MECLIZINE HCL 25 MG PO TABS
25.0000 mg | ORAL_TABLET | Freq: Once | ORAL | Status: AC
  Administered 2014-01-16: 25 mg via ORAL
  Filled 2014-01-16: qty 1

## 2014-01-16 MED ORDER — DIAZEPAM 5 MG PO TABS: 5.0000 mg | ORAL_TABLET | Freq: Once | ORAL | Status: DC

## 2014-01-16 MED ORDER — ONDANSETRON 4 MG PO TBDP
8.0000 mg | ORAL_TABLET | Freq: Once | ORAL | Status: AC
  Administered 2014-01-16: 8 mg via ORAL
  Filled 2014-01-16: qty 2

## 2014-01-16 MED ORDER — MECLIZINE HCL 25 MG PO TABS: 25.0000 mg | ORAL_TABLET | Freq: Two times a day (BID) | ORAL | Status: DC | PRN

## 2014-01-16 MED ORDER — PREDNISONE 20 MG PO TABS: 60.0000 mg | ORAL_TABLET | ORAL | Status: DC

## 2014-01-16 MED ORDER — ACYCLOVIR 200 MG PO CAPS
400.0000 mg | ORAL_CAPSULE | Freq: Once | ORAL | Status: DC
  Filled 2014-01-16 (×2): qty 2

## 2014-01-16 MED ORDER — HYDROCODONE-ACETAMINOPHEN 5-325 MG PO TABS: 1.0000 | ORAL_TABLET | Freq: Four times a day (QID) | ORAL | Status: DC | PRN

## 2014-01-16 MED ORDER — ONDANSETRON HCL 4 MG PO TABS: 4.0000 mg | ORAL_TABLET | Freq: Four times a day (QID) | ORAL | Status: DC

## 2014-01-16 NOTE — ED Notes (Signed)
Patient tolerated liquid and solid PO intake without incident.

## 2014-01-16 NOTE — ED Notes (Signed)
MD Horton informed of patient being with continued nausea and new dizziness with movement.

## 2014-01-16 NOTE — ED Notes (Signed)
Patient tolerated prednisone tablets with ginger ale. Ok to go home per MD.

## 2014-01-16 NOTE — ED Notes (Signed)
MD Horton informed that patient vomited medications again. MD advised this RN to administer zofran, reassess, and administer only prednisone if patient's nausea is resolved.

## 2014-01-16 NOTE — Discharge Instructions (Signed)
Hoy hay evidencia de presion altat por mucho tiempo, pero no hay un infarcto nuevo.  Tiene "Bell's Palsy".  Es muy importante que visita con su doctor propio y con una especialista de cerebros.  Regresa aqui si hay algo diferente en su condicion.

## 2014-01-16 NOTE — ED Provider Notes (Addendum)
Rx rewritten for hydrocodone and zofran.  Originally hydrocodone Rx shredded.  Merryl Hacker, MD 01/16/14 [phone]  At discharge, patient had acute onset of dizziness and vomiting. She reports room spinning dizziness. No history of vertigo. Patient has evidence of nystagmus on exam. She is otherwise nonfocal and coordination is intact to finger-nose-finger. Patient was given Zofran, meclizine, and Valium. Patient had improvement of her symptoms. She was able to tolerate her medications prior to discharge.  Merryl Hacker, MD 01/16/14 [phone]

## 2014-01-16 NOTE — ED Notes (Signed)
Patient began to experience increased nausea upon PO intake, Zofran administered.

## 2014-01-20 ENCOUNTER — Emergency Department (HOSPITAL_COMMUNITY)
Admission: EM | Admit: 2014-01-20 | Discharge: 2014-01-20 | Disposition: A | Discharge status: Other Acute Inpatient Hospital | Payer: No Typology Code available for payment source | Source: Home / Self Care | Attending: Family Medicine | Admitting: Family Medicine

## 2014-01-20 ENCOUNTER — Encounter (HOSPITAL_COMMUNITY): Payer: Self-pay | Admitting: Emergency Medicine

## 2014-01-20 ENCOUNTER — Observation Stay (HOSPITAL_COMMUNITY)
Admission: EM | Admit: 2014-01-20 | Discharge: 2014-01-22 | Disposition: A | Discharge status: Home / Self Care | Payer: No Typology Code available for payment source | Attending: Internal Medicine | Admitting: Internal Medicine

## 2014-01-20 ENCOUNTER — Emergency Department (HOSPITAL_COMMUNITY): Payer: No Typology Code available for payment source

## 2014-01-20 DIAGNOSIS — M2550 Pain in unspecified joint: Secondary | ICD-10-CM

## 2014-01-20 DIAGNOSIS — R531 Weakness: Secondary | ICD-10-CM

## 2014-01-20 DIAGNOSIS — I2510 Atherosclerotic heart disease of native coronary artery without angina pectoris: Secondary | ICD-10-CM

## 2014-01-20 DIAGNOSIS — I10 Essential (primary) hypertension: Secondary | ICD-10-CM | POA: Diagnosis present

## 2014-01-20 DIAGNOSIS — G510 Bell's palsy: Secondary | ICD-10-CM | POA: Diagnosis present

## 2014-01-20 DIAGNOSIS — G500 Trigeminal neuralgia: Principal | ICD-10-CM | POA: Diagnosis present

## 2014-01-20 DIAGNOSIS — Z7982 Long term (current) use of aspirin: Secondary | ICD-10-CM | POA: Insufficient documentation

## 2014-01-20 DIAGNOSIS — I639 Cerebral infarction, unspecified: Secondary | ICD-10-CM

## 2014-01-20 DIAGNOSIS — R5381 Other malaise: Secondary | ICD-10-CM

## 2014-01-20 DIAGNOSIS — R079 Chest pain, unspecified: Secondary | ICD-10-CM

## 2014-01-20 DIAGNOSIS — K8020 Calculus of gallbladder without cholecystitis without obstruction: Secondary | ICD-10-CM | POA: Insufficient documentation

## 2014-01-20 DIAGNOSIS — IMO0002 Reserved for concepts with insufficient information to code with codable children: Secondary | ICD-10-CM | POA: Diagnosis present

## 2014-01-20 DIAGNOSIS — R112 Nausea with vomiting, unspecified: Secondary | ICD-10-CM

## 2014-01-20 DIAGNOSIS — R29810 Facial weakness: Secondary | ICD-10-CM

## 2014-01-20 DIAGNOSIS — Z8673 Personal history of transient ischemic attack (TIA), and cerebral infarction without residual deficits: Secondary | ICD-10-CM | POA: Insufficient documentation

## 2014-01-20 DIAGNOSIS — R109 Unspecified abdominal pain: Secondary | ICD-10-CM | POA: Insufficient documentation

## 2014-01-20 DIAGNOSIS — R519 Headache, unspecified: Secondary | ICD-10-CM | POA: Insufficient documentation

## 2014-01-20 DIAGNOSIS — Z853 Personal history of malignant neoplasm of breast: Secondary | ICD-10-CM

## 2014-01-20 DIAGNOSIS — K760 Fatty (change of) liver, not elsewhere classified: Secondary | ICD-10-CM | POA: Diagnosis present

## 2014-01-20 DIAGNOSIS — R51 Headache: Secondary | ICD-10-CM | POA: Insufficient documentation

## 2014-01-20 DIAGNOSIS — R5383 Other fatigue: Secondary | ICD-10-CM

## 2014-01-20 DIAGNOSIS — N390 Urinary tract infection, site not specified: Secondary | ICD-10-CM | POA: Diagnosis present

## 2014-01-20 DIAGNOSIS — E1165 Type 2 diabetes mellitus with hyperglycemia: Secondary | ICD-10-CM | POA: Diagnosis present

## 2014-01-20 DIAGNOSIS — E785 Hyperlipidemia, unspecified: Secondary | ICD-10-CM

## 2014-01-20 DIAGNOSIS — K7689 Other specified diseases of liver: Secondary | ICD-10-CM | POA: Insufficient documentation

## 2014-01-20 DIAGNOSIS — IMO0001 Reserved for inherently not codable concepts without codable children: Secondary | ICD-10-CM | POA: Insufficient documentation

## 2014-01-20 LAB — I-STAT CHEM 8, ED
BUN: 16 mg/dL (ref 6–23)
CREATININE: 0.8 mg/dL (ref 0.50–1.10)
Calcium, Ion: 1.21 mmol/L (ref 1.12–1.23)
Chloride: 101 mEq/L (ref 96–112)
Glucose, Bld: 300 mg/dL — ABNORMAL HIGH (ref 70–99)
HCT: 42 % (ref 36.0–46.0)
HEMOGLOBIN: 14.3 g/dL (ref 12.0–15.0)
POTASSIUM: 4.1 meq/L (ref 3.7–5.3)
SODIUM: 143 meq/L (ref 137–147)
TCO2: 26 mmol/L (ref 0–100)

## 2014-01-20 LAB — COMPREHENSIVE METABOLIC PANEL
ALT: 54 U/L — ABNORMAL HIGH (ref 0–35)
AST: 72 U/L — AB (ref 0–37)
Albumin: 3.7 g/dL (ref 3.5–5.2)
Alkaline Phosphatase: 117 U/L (ref 39–117)
BUN: 18 mg/dL (ref 6–23)
CALCIUM: 9.3 mg/dL (ref 8.4–10.5)
CO2: 22 meq/L (ref 19–32)
CREATININE: 0.67 mg/dL (ref 0.50–1.10)
Chloride: 96 mEq/L (ref 96–112)
GFR calc Af Amer: >90 mL/min (ref >90)
Glucose, Bld: 426 mg/dL — ABNORMAL HIGH (ref 70–99)
Potassium: 4.6 mEq/L (ref 3.7–5.3)
Sodium: 135 mEq/L — ABNORMAL LOW (ref 137–147)
TOTAL PROTEIN: 8 g/dL (ref 6.0–8.3)
Total Bilirubin: 0.7 mg/dL (ref 0.3–1.2)

## 2014-01-20 LAB — CBC WITH DIFFERENTIAL/PLATELET
BASOS ABS: 0 10*3/uL (ref 0.0–0.1)
BASOS PCT: 0 % (ref 0–1)
EOS PCT: 0 % (ref 0–5)
Eosinophils Absolute: 0 10*3/uL (ref 0.0–0.7)
HCT: 41.1 % (ref 36.0–46.0)
Hemoglobin: 14.9 g/dL (ref 12.0–15.0)
Lymphocytes Relative: 10 % — ABNORMAL LOW (ref 12–46)
Lymphs Abs: 0.7 10*3/uL (ref 0.7–4.0)
MCH: 31.3 pg (ref 26.0–34.0)
MCHC: 36.3 g/dL — AB (ref 30.0–36.0)
MCV: 86.3 fL (ref 78.0–100.0)
Monocytes Absolute: 0.1 10*3/uL (ref 0.1–1.0)
Monocytes Relative: 2 % — ABNORMAL LOW (ref 3–12)
Neutro Abs: 6 10*3/uL (ref 1.7–7.7)
Neutrophils Relative %: 88 % — ABNORMAL HIGH (ref 43–77)
PLATELETS: 177 10*3/uL (ref 150–400)
RBC: 4.76 MIL/uL (ref 3.87–5.11)
RDW: 13.2 % (ref 11.5–15.5)
WBC: 6.8 10*3/uL (ref 4.0–10.5)

## 2014-01-20 LAB — URINALYSIS, DIPSTICK ONLY
Bilirubin Urine: NEGATIVE
Hgb urine dipstick: NEGATIVE
Ketones, ur: 15 mg/dL — AB
NITRITE: POSITIVE — AB
Protein, ur: NEGATIVE mg/dL
SPECIFIC GRAVITY, URINE: 1.028 (ref 1.005–1.030)
Urobilinogen, UA: 1 mg/dL (ref 0.0–1.0)
pH: 6 (ref 5.0–8.0)

## 2014-01-20 LAB — CBG MONITORING, ED
Glucose-Capillary: 402 mg/dL — ABNORMAL HIGH (ref 70–99)
Glucose-Capillary: 314 mg/dL — ABNORMAL HIGH (ref 70–99)

## 2014-01-20 LAB — I-STAT ARTERIAL BLOOD GAS, ED
Acid-Base Excess: 1 mmol/L (ref 0.0–2.0)
Bicarbonate: 25.9 mEq/L — ABNORMAL HIGH (ref 20.0–24.0)
O2 Saturation: 95 %
PCO2 ART: 40.9 mmHg (ref 35.0–45.0)
PO2 ART: 78 mmHg — AB (ref 80.0–100.0)
Patient temperature: 98.6
TCO2: 27 mmol/L (ref 0–100)
pH, Arterial: 7.409 (ref 7.350–7.450)

## 2014-01-20 LAB — PREGNANCY, URINE: Preg Test, Ur: NEGATIVE

## 2014-01-20 LAB — TROPONIN I: Troponin I: <0.3 ng/mL (ref <0.30)

## 2014-01-20 LAB — GLUCOSE, CAPILLARY: Glucose-Capillary: 203 mg/dL — ABNORMAL HIGH (ref 70–99)

## 2014-01-20 MED ORDER — MORPHINE SULFATE 2 MG/ML IJ SOLN
2.0000 mg | Freq: Once | INTRAMUSCULAR | Status: AC
  Administered 2014-01-20: 2 mg via INTRAVENOUS
  Filled 2014-01-20: qty 1

## 2014-01-20 MED ORDER — ONDANSETRON HCL 4 MG PO TABS
4.0000 mg | ORAL_TABLET | Freq: Four times a day (QID) | ORAL | Status: DC
  Administered 2014-01-21 (×2): 4 mg via ORAL
  Filled 2014-01-20 (×6): qty 1

## 2014-01-20 MED ORDER — ACYCLOVIR 200 MG PO CAPS: 400.0000 mg | ORAL_CAPSULE | Freq: Every day | ORAL | Status: DC

## 2014-01-20 MED ORDER — ACYCLOVIR 200 MG PO CAPS
400.0000 mg | ORAL_CAPSULE | Freq: Every day | ORAL | Status: DC
  Administered 2014-01-21 – 2014-01-22 (×9): 400 mg via ORAL
  Filled 2014-01-20 (×12): qty 2

## 2014-01-20 MED ORDER — MECLIZINE HCL 25 MG PO TABS
25.0000 mg | ORAL_TABLET | Freq: Two times a day (BID) | ORAL | Status: DC | PRN
  Filled 2014-01-20: qty 1

## 2014-01-20 MED ORDER — SODIUM CHLORIDE 0.9 % IV BOLUS (SEPSIS)
1000.0000 mL | Freq: Once | INTRAVENOUS | Status: AC
  Administered 2014-01-20: 1000 mL via INTRAVENOUS

## 2014-01-20 MED ORDER — LORAZEPAM 2 MG/ML IJ SOLN
1.0000 mg | Freq: Once | INTRAMUSCULAR | Status: DC
  Filled 2014-01-20: qty 1

## 2014-01-20 MED ORDER — INSULIN GLARGINE 100 UNIT/ML ~~LOC~~ SOLN
5.0000 [IU] | Freq: Every day | SUBCUTANEOUS | Status: DC
  Administered 2014-01-21 (×2): 5 [IU] via SUBCUTANEOUS
  Filled 2014-01-20 (×3): qty 0.05

## 2014-01-20 MED ORDER — ATORVASTATIN CALCIUM 10 MG PO TABS
10.0000 mg | ORAL_TABLET | Freq: Every day | ORAL | Status: DC
  Administered 2014-01-21 – 2014-01-22 (×2): 10 mg via ORAL
  Filled 2014-01-20 (×2): qty 1

## 2014-01-20 MED ORDER — ACETAMINOPHEN 650 MG RE SUPP: 650.0000 mg | Freq: Four times a day (QID) | RECTAL | Status: DC | PRN

## 2014-01-20 MED ORDER — SODIUM CHLORIDE 0.9 % IV SOLN
INTRAVENOUS | Status: AC
  Administered 2014-01-21: 10:00:00 via INTRAVENOUS
  Administered 2014-01-21: 1000 mL via INTRAVENOUS

## 2014-01-20 MED ORDER — ADULT MULTIVITAMIN W/MINERALS CH
1.0000 | ORAL_TABLET | Freq: Every day | ORAL | Status: DC
  Filled 2014-01-20: qty 1

## 2014-01-20 MED ORDER — ENOXAPARIN SODIUM 40 MG/0.4ML ~~LOC~~ SOLN
40.0000 mg | SUBCUTANEOUS | Status: DC
Start: 2014-01-21 — End: 2014-01-22
  Administered 2014-01-21 – 2014-01-22 (×2): 40 mg via SUBCUTANEOUS
  Filled 2014-01-20 (×2): qty 0.4

## 2014-01-20 MED ORDER — LETROZOLE 2.5 MG PO TABS
2.5000 mg | ORAL_TABLET | Freq: Every day | ORAL | Status: DC
  Administered 2014-01-21 – 2014-01-22 (×2): 2.5 mg via ORAL
  Filled 2014-01-20 (×2): qty 1

## 2014-01-20 MED ORDER — ONDANSETRON HCL 4 MG PO TABS: 4.0000 mg | ORAL_TABLET | Freq: Four times a day (QID) | ORAL | Status: DC | PRN

## 2014-01-20 MED ORDER — ACETAMINOPHEN 325 MG PO TABS
650.0000 mg | ORAL_TABLET | Freq: Four times a day (QID) | ORAL | Status: DC | PRN
  Administered 2014-01-22: 650 mg via ORAL
  Filled 2014-01-20: qty 2

## 2014-01-20 MED ORDER — BUPROPION HCL ER (SR) 150 MG PO TB12
150.0000 mg | ORAL_TABLET | Freq: Every day | ORAL | Status: DC
  Administered 2014-01-21 – 2014-01-22 (×2): 150 mg via ORAL
  Filled 2014-01-20 (×2): qty 1

## 2014-01-20 MED ORDER — ASPIRIN EC 81 MG PO TBEC
81.0000 mg | DELAYED_RELEASE_TABLET | Freq: Every day | ORAL | Status: DC
  Administered 2014-01-21 – 2014-01-22 (×2): 81 mg via ORAL
  Filled 2014-01-20 (×2): qty 1

## 2014-01-20 MED ORDER — LISINOPRIL 20 MG PO TABS
20.0000 mg | ORAL_TABLET | Freq: Every day | ORAL | Status: DC
  Administered 2014-01-21 – 2014-01-22 (×2): 20 mg via ORAL
  Filled 2014-01-20 (×2): qty 1

## 2014-01-20 MED ORDER — HYDROMORPHONE HCL PF 1 MG/ML IJ SOLN
1.0000 mg | INTRAMUSCULAR | Status: AC
  Administered 2014-01-20: 1 mg via INTRAVENOUS
  Filled 2014-01-20: qty 1

## 2014-01-20 MED ORDER — PREDNISONE 50 MG PO TABS
60.0000 mg | ORAL_TABLET | ORAL | Status: AC
  Administered 2014-01-21: 60 mg via ORAL
  Filled 2014-01-20: qty 1

## 2014-01-20 MED ORDER — HYDROMORPHONE HCL PF 1 MG/ML IJ SOLN
1.0000 mg | INTRAMUSCULAR | Status: DC | PRN
  Administered 2014-01-21 – 2014-01-22 (×6): 1 mg via INTRAVENOUS
  Filled 2014-01-20 (×6): qty 1

## 2014-01-20 MED ORDER — ONDANSETRON HCL 4 MG/2ML IJ SOLN
4.0000 mg | Freq: Four times a day (QID) | INTRAMUSCULAR | Status: DC | PRN
  Administered 2014-01-22: 4 mg via INTRAVENOUS
  Filled 2014-01-20: qty 2

## 2014-01-20 MED ORDER — INSULIN ASPART 100 UNIT/ML ~~LOC~~ SOLN
0.0000 [IU] | Freq: Three times a day (TID) | SUBCUTANEOUS | Status: DC
  Administered 2014-01-21: 7 [IU] via SUBCUTANEOUS
  Administered 2014-01-21: 1 [IU] via SUBCUTANEOUS
  Administered 2014-01-21: 3 [IU] via SUBCUTANEOUS
  Administered 2014-01-21: 5 [IU] via SUBCUTANEOUS
  Administered 2014-01-22: 3 [IU] via SUBCUTANEOUS
  Administered 2014-01-22: 5 [IU] via SUBCUTANEOUS

## 2014-01-20 MED ORDER — ONDANSETRON HCL 4 MG/2ML IJ SOLN
4.0000 mg | Freq: Once | INTRAMUSCULAR | Status: AC
  Administered 2014-01-20: 4 mg via INTRAVENOUS
  Filled 2014-01-20: qty 2

## 2014-01-20 MED ORDER — GABAPENTIN 300 MG PO CAPS
300.0000 mg | ORAL_CAPSULE | Freq: Every day | ORAL | Status: DC
  Administered 2014-01-21 (×2): 300 mg via ORAL
  Filled 2014-01-20 (×3): qty 1

## 2014-01-20 MED ORDER — LISINOPRIL-HYDROCHLOROTHIAZIDE 20-12.5 MG PO TABS: 1.0000 | ORAL_TABLET | Freq: Every day | ORAL | Status: DC

## 2014-01-20 MED ORDER — HYDROCHLOROTHIAZIDE 12.5 MG PO CAPS
12.5000 mg | ORAL_CAPSULE | Freq: Every day | ORAL | Status: DC
  Filled 2014-01-20: qty 1

## 2014-01-20 NOTE — Consult Note (Addendum)
Referring Physician: ED    Chief Complaint: CODE STROKE: APHASIA, LEFT FACE WEAKNESS.  HPI:                                                                                                                                         Brandi Bryant is an 58 y.o. female, with a past medical history significant for HTN, DM, stroke in 2006, breast cancer, recently diagnosed with right Bell's palsy,sent here today from [facility] because of worsening HA and facial droop since last week.  Pt brought back from CT, CT tech states pt left face started to "droop and pt cannot speak". Upon arrival pt c/o aphasia, grabbing at her mouth and telling family she cannot speak. Code stroke called. At this moment, she is non verbal but has intact comprehension and is able to read and write. As a matter of fact, after completion of neurological exam she was able to tell me her name, year, and month and I did not appreciate language impairment. Complains of HA, numbness-tingling in her face but no reported double vision, vertigo, difficulty swallowing, confusion, or visual disturbances. CT brain showed no acute abnormality. NIHSS 4  Date last known well: 01/20/14 Time last known well: 1655 tPA Given: no, very inconsistent exam, low NIHSS NIHSS: 4 MRS: 0  Past Medical History  Diagnosis Date  . Breast cancer   . HTN (hypertension)   . Diabetes mellitus   . Cholelithiasis   . Hepatic steatosis   . Abdominal pain   . History of breast cancer 06/01/2012  . Arthralgia 08/13/2013  . Chest pain 09/30/2013  . Stroke 2006    Past Surgical History  Procedure Laterality Date  . Tubal ligation    . Breast lumpectomy  2010    Family History  Problem Relation Age of Onset  . Thyroid cancer Sister     thyroid   Social History:  reports that she has never smoked. She does not have any smokeless tobacco history on file. She reports that she does not drink alcohol or use illicit drugs.  Allergies:  Allergies   Allergen Reactions  . Gadolinium Derivatives     Code: VOM, Desc: Pt began vomiting 45 sec after MRI contrast injection of Multihance, Onset Date: [id number]      Medications:  I have reviewed the patient's current medications.  ROS:                                                                                                                                       History obtained from chart review, family.  General ROS: negative for - chills, fatigue, fever, night sweats, weight gain or weight loss Psychological ROS: negative for - behavioral disorder, hallucinations, memory difficulties, mood swings or suicidal ideation Ophthalmic ROS: negative for - blurry vision, double vision, eye pain or loss of vision ENT ROS: negative for - epistaxis, nasal discharge, oral lesions, sore throat, tinnitus or vertigo Allergy and Immunology ROS: negative for - hives or itchy/watery eyes Hematological and Lymphatic ROS: negative for - bleeding problems, bruising or swollen lymph nodes Endocrine ROS: negative for - galactorrhea, hair pattern changes, polydipsia/polyuria or temperature intolerance Respiratory ROS: negative for - cough, hemoptysis, shortness of breath or wheezing Cardiovascular ROS: negative for - chest pain, dyspnea on exertion, edema or irregular heartbeat Gastrointestinal ROS: negative for - abdominal pain, diarrhea, hematemesis, nausea/vomiting or stool incontinence Genito-Urinary ROS: negative for - dysuria, hematuria, incontinence or urinary frequency/urgency Musculoskeletal ROS: negative for - joint swelling or muscular weakness Neurological ROS: as noted in HPI Dermatological ROS: negative for rash and skin lesion changes  Physical exam: pleasant female in no apparent distress. Blood pressure 167/91, pulse 71, temperature 98.4 F (36.9 C), temperature source  Oral, resp. rate 14, SpO2 96.00%. Head: normocephalic. Neck: supple, no bruits, no JVD. Cardiac: no murmurs. Lungs: clear. Abdomen: soft, no tender, no mass. Extremities: no edema.  Neurologic Examination:                                                                                                      Mental Status: Alert, awake,oriented to place-year-month-situation. Comprehension intact. She is able to write and read and can say few words without frank evidence that expressive dysphasia. Cranial Nerves: II: Discs flat bilaterally; Visual fields grossly normal, pupils equal, round, reactive to light and accommodation III,IV, VI: ptosis not present, extra-ocular motions intact bilaterally V,VII: smile asymmetric with right facial weakness peripheral type, facial light touch sensation normal bilaterally VIII: hearing normal bilaterally IX,X: gag reflex present XI: bilateral shoulder shrug XII: midline tongue extension without atrophy or fasciculations  Motor: Right : Upper extremity   5/5    Left:     Upper extremity   5/5  Lower extremity   5/5     Lower extremity  5/5 Tone and bulk:normal tone throughout; no atrophy noted Sensory: Pinprick and light touch is very inconsistent Deep Tendon Reflexes:  Right: Upper Extremity   Left: Upper extremity   biceps (C-5 to C-6) 2/4   biceps (C-5 to C-6) 2/4 tricep (C7) 2/4    triceps (C7) 2/4 Brachioradialis (C6) 2/4  Brachioradialis (C6) 2/4  Lower Extremity Lower Extremity  quadriceps (L-2 to L-4) 2/4   quadriceps (L-2 to L-4) 2/4 Achilles (S1) 2/4   Achilles (S1) 2/4  Plantars: Right: downgoing   Left: downgoing Cerebellar: normal finger-to-nose,  normal heel-to-shin test Gait:  No tested. CV: pulses palpable throughout    Results for orders placed during the hospital encounter of 01/20/14 (from the past 48 hour(s))  CBC WITH DIFFERENTIAL     Status: Abnormal   Collection Time    01/20/14  4:21 PM      Result Value Ref  Range   WBC 6.8  4.0 - 10.5 K/uL   RBC 4.76  3.87 - 5.11 MIL/uL   Hemoglobin 14.9  12.0 - 15.0 g/dL   HCT 41.1  36.0 - 46.0 %   MCV 86.3  78.0 - 100.0 fL   MCH 31.3  26.0 - 34.0 pg   MCHC 36.3 (*) 30.0 - 36.0 g/dL   RDW 13.2  11.5 - 15.5 %   Platelets 177  150 - 400 K/uL   Neutrophils Relative % 88 (*) 43 - 77 %   Neutro Abs 6.0  1.7 - 7.7 K/uL   Lymphocytes Relative 10 (*) 12 - 46 %   Lymphs Abs 0.7  0.7 - 4.0 K/uL   Monocytes Relative 2 (*) 3 - 12 %   Monocytes Absolute 0.1  0.1 - 1.0 K/uL   Eosinophils Relative 0  0 - 5 %   Eosinophils Absolute 0.0  0.0 - 0.7 K/uL   Basophils Relative 0  0 - 1 %   Basophils Absolute 0.0  0.0 - 0.1 K/uL  COMPREHENSIVE METABOLIC PANEL     Status: Abnormal   Collection Time    01/20/14  4:21 PM      Result Value Ref Range   Sodium 135 (*) 137 - 147 mEq/L   Potassium 4.6  3.7 - 5.3 mEq/L   Chloride 96  96 - 112 mEq/L   CO2 22  19 - 32 mEq/L   Glucose, Bld 426 (*) 70 - 99 mg/dL   BUN 18  6 - 23 mg/dL   Creatinine, Ser 0.67  0.50 - 1.10 mg/dL   Calcium 9.3  8.4 - 10.5 mg/dL   Total Protein 8.0  6.0 - 8.3 g/dL   Albumin 3.7  3.5 - 5.2 g/dL   AST 72 (*) 0 - 37 U/L   ALT 54 (*) 0 - 35 U/L   Alkaline Phosphatase 117  39 - 117 U/L   Total Bilirubin 0.7  0.3 - 1.2 mg/dL   GFR calc non Af Amer >90  >90 mL/min   GFR calc Af Amer >90  >90 mL/min   Comment: (NOTE)     The eGFR has been calculated using the CKD EPI equation.     This calculation has not been validated in all clinical situations.     eGFR's persistently <90 mL/min signify possible Chronic Kidney     Disease.  TROPONIN I     Status: None   Collection Time    01/20/14  4:21 PM      Result Value Ref Range  Troponin I <0.30  <0.30 ng/mL   Comment:            Due to the release kinetics of cTnI,     a negative result within the first hours     of the onset of symptoms does not rule out     myocardial infarction with certainty.     If myocardial infarction is still suspected,      repeat the test at appropriate intervals.  URINALYSIS, DIPSTICK ONLY     Status: Abnormal   Collection Time    01/20/14  4:27 PM      Result Value Ref Range   Specific Gravity, Urine 1.028  1.005 - 1.030   pH 6.0  5.0 - 8.0   Glucose, UA >1000 (*) NEGATIVE mg/dL   Hgb urine dipstick NEGATIVE  NEGATIVE   Bilirubin Urine NEGATIVE  NEGATIVE   Ketones, ur 15 (*) NEGATIVE mg/dL   Protein, ur NEGATIVE  NEGATIVE mg/dL   Urobilinogen, UA 1.0  0.0 - 1.0 mg/dL   Nitrite POSITIVE (*) NEGATIVE   Leukocytes, UA SMALL (*) NEGATIVE  PREGNANCY, URINE     Status: None   Collection Time    01/20/14  4:27 PM      Result Value Ref Range   Preg Test, Ur NEGATIVE  NEGATIVE   Comment:            THE SENSITIVITY OF THIS     METHODOLOGY IS >20 mIU/mL.   Ct Head Wo Contrast  01/20/2014   CLINICAL DATA:  Headaches  EXAM: CT HEAD WITHOUT CONTRAST  TECHNIQUE: Contiguous axial images were obtained from the base of the skull through the vertex without intravenous contrast.  COMPARISON:  CT ANGIO HEAD W/CM &/OR WO/CM dated 01/15/2014; CT HEAD W/O CM dated 01/15/2014; MR HEAD W/O CM dated 01/15/2014  FINDINGS: There is no evidence of mass effect, midline shift or extra-axial fluid collections. There is no evidence of a space-occupying lesion or intracranial hemorrhage. There is no evidence of a cortical-based area of acute infarction. 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 There is periventricular white matter low attenuation likely secondary to microangiopathy.  The ventricles and sulci are appropriate for the patient's age. The basal cisterns are patent.  Visualized portions of the orbits are unremarkable. Small right maxillary sinus air-fluid level and bilateral ethmoid sinus mucosal thickening.  The osseous structures are unremarkable.  IMPRESSION: 1. No acute intracranial pathology. 2. Sinus disease as described above.   Electronically Signed   By: Kathreen Devoid   On: 01/20/2014 17:02     Assessment: 58 y.o. female with acute  onset inability to speak with intact comprehension and able to read and write.  NIHSS 4 but very inconsistent exam. She is within the window for thrombolysis but will not pursue IV thrombolysis due to NIHSS 2 and very inconsistent exam. Recommend: MRI brain: if positive admit to medicine and complete stroke work up. Otherwise, can be be discharge home if MRI negative.  Stroke Risk Factors - HTN, DM, stroke   Dorian Pod, MD [facility] [phone]  01/20/2014, 5:38 PM

## 2014-01-20 NOTE — ED Notes (Signed)
Pt sent here from [facility]. Pt has had worsening HA and facial droop since last week. Seen here and dx with Bells Palsy. Pt hx of stroke in the past. Pt also very weak. Pt crying at triage due to the pain.

## 2014-01-20 NOTE — ED Notes (Signed)
Hold ativan per Dr. Marthann Schiller

## 2014-01-20 NOTE — ED Notes (Signed)
Dr. Armida Sans and rapid response at bedside

## 2014-01-20 NOTE — H&P (Addendum)
[facility] History and Physical  Katrese Shell FBP:[id number] DOB: [birth year] DOA: 01/20/2014  Referring physician: ER physician. PCP: Pcp Not In System  History obtained from patient's son acting as a Optometrist for [place].  Chief Complaint: Headache and facial pain.  HPI: Brandi Bryant is a 58 y.o. female with history of breast cancer who diabetes mellitus hypertension, CVA was brought in the ER after patient had persistent pain in the left side of the face. Patient was recently diagnosed with Bell's palsy last Thursday 4 days ago and was placed on acyclovir and prednisone and at that time patient had MRI of the brain which was unremarkable presents to the ER after patient was found to have increasing pain on the left side of her face and also occipital area.. During the last visit patient also had CT angiogram of the head which was unremarkable. Since patient had significant pain patient was taken to to get CT head done on patient became aphasic and a code stroke was called neurologist Dr. Aram Beecham had seen the patient and had advised MRI brain done and if negative can be discharged home. But patient was having significant pain in the face area requiring multiple doses of narcotics and patient is admitted for pain management. Patient MRI brain has come negative for anything acute. Patient still complains of some numbness in the right upper extremity and right shoulder area. Denies any chest pain or shortness of breath. Denies any visual symptoms difficulty speaking swallowing. Patient has had pain in the right side of the face since last Thursday but left-sided facial pain started today. Left-sided facial pain is all over the face area. Improves with IV narcotics.   Review of Systems: As presented in the history of presenting illness, rest negative.  Past Medical History  Diagnosis Date  . Breast cancer   . HTN (hypertension)   . Diabetes mellitus   . Cholelithiasis    . Hepatic steatosis   . Abdominal pain   . History of breast cancer 06/01/2012  . Arthralgia 08/13/2013  . Chest pain 09/30/2013  . Stroke 2006   Past Surgical History  Procedure Laterality Date  . Tubal ligation    . Breast lumpectomy  2010   Social History:  reports that she has never smoked. She does not have any smokeless tobacco history on file. She reports that she does not drink alcohol or use illicit drugs. Where does patient live home. Can patient participate in ADLs? Yes.  Allergies  Allergen Reactions  . Gadolinium Derivatives     Code: VOM, Desc: Pt began vomiting 45 sec after MRI contrast injection of Multihance, Onset Date: [id number]      Family History:  Family History  Problem Relation Age of Onset  . Thyroid cancer Sister     thyroid      Prior to Admission medications   Medication Sig Start Date End Date Taking? Authorizing Provider  acyclovir (ZOVIRAX) 200 MG capsule Take 400 mg by mouth 5 (five) times daily. Five times daily for ten days. Patient started medication a week ago from today(01-20-14) 01/16/14  Yes Carmin Muskrat, MD  aspirin EC 81 MG tablet Take 81 mg by mouth daily.   Yes Historical Provider, MD  atorvastatin (LIPITOR) 10 MG tablet Take 10 mg by mouth daily.   Yes Historical Provider, MD  buPROPion (WELLBUTRIN SR) 150 MG 12 hr tablet Take 150 mg by mouth daily.   Yes Historical Provider, MD  calcium-vitamin D (OSCAL WITH D) 500-200 MG-UNIT per  tablet Take 1 tablet by mouth daily with breakfast.   Yes Historical Provider, MD  HYDROcodone-acetaminophen (NORCO/VICODIN) 5-325 MG per tablet Take 1 tablet by mouth every 6 (six) hours as needed. 01/16/14  Yes Carmin Muskrat, MD  ibuprofen (ADVIL,MOTRIN) 800 MG tablet Take 800 mg by mouth every 8 (eight) hours as needed for pain.   Yes Historical Provider, MD  letrozole (FEMARA) 2.5 MG tablet Take 2.5 mg by mouth daily.   Yes Historical Provider, MD  lisinopril-hydrochlorothiazide (PRINZIDE,ZESTORETIC)  20-12.5 MG per tablet Take 1 tablet by mouth daily.   Yes Historical Provider, MD  meclizine (ANTIVERT) 25 MG tablet Take 1 tablet (25 mg total) by mouth 2 (two) times daily as needed for dizziness. 01/16/14  Yes Merryl Hacker, MD  metFORMIN (GLUCOPHAGE) 850 MG tablet Take 425-850 mg by mouth 2 (two) times daily with a meal. Takes 850mg  in the morning and 425mg  in the evening   Yes Historical Provider, MD  Multiple Vitamins-Minerals (MULTIVITAMIN WITH MINERALS) tablet Take 1 tablet by mouth daily.   Yes Historical Provider, MD  ondansetron (ZOFRAN) 4 MG tablet Take 1 tablet (4 mg total) by mouth every 6 (six) hours. 01/16/14  Yes Merryl Hacker, MD  predniSONE (DELTASONE) 20 MG tablet Take 3 tablets (60 mg total) by mouth stat. For ten days 01/16/14  Yes Carmin Muskrat, MD    Physical Exam: Filed Vitals:   01/20/14 1630 01/20/14 1700 01/20/14 1715 01/20/14 1909  BP: 151/74 177/78 167/91 127/64  Pulse: 78 75 71 63  Temp:      TempSrc:      Resp: 21 14 14 18   SpO2: 97% 98% 96% 97%     General:  Well developed and moderately nourished.  Eyes: Anicteric no pallor. Right-sided facial palsy.  ENT: No discharge from the ears eyes nose or mouth. Right-sided facial palsy.  Neck: No neck rigidity. No mass felt.  Cardiovascular: S1-S2 heard.  Respiratory: No rhonchi or crepitations.  Abdomen: Soft nontender bowel sounds present.  Skin: No rash.   Musculoskeletal: No edema.  Psychiatric: Appears normal.  Neurologic: Alert awake oriented to time place and person. Moves all extremities 5 x 5. Right-sided facial palsy.  Labs on Admission:  Basic Metabolic Panel:  Recent Labs Lab 01/15/14 1757 01/15/14 1820 01/20/14 1621 01/20/14 2224  NA 141 140 135* 143  K 3.6* 3.3* 4.6 4.1  CL 100 104 96 101  CO2 22  --  22  --   GLUCOSE 225* 234* 426* 300*  BUN 13 12 18 16   CREATININE 0.73 0.70 0.67 0.80  CALCIUM 9.3  --  9.3  --    Liver Function Tests:  Recent Labs Lab  01/15/14 1757 01/20/14 1621  AST 116* 72*  ALT 62* 54*  ALKPHOS 117 117  BILITOT 0.5 0.7  PROT 7.9 8.0  ALBUMIN 3.9 3.7   No results found for this basename: LIPASE, AMYLASE,  in the last 168 hours No results found for this basename: AMMONIA,  in the last 168 hours CBC:  Recent Labs Lab 01/15/14 1757 01/15/14 1820 01/20/14 1621 01/20/14 2224  WBC 6.5  --  6.8  --   NEUTROABS 3.6  --  6.0  --   HGB 13.7 13.9 14.9 14.3  HCT 38.4 41.0 41.1 42.0  MCV 86.1  --  86.3  --   PLT 164  --  177  --    Cardiac Enzymes:  Recent Labs Lab 01/20/14 1621  TROPONINI <0.30  BNP (last 3 results) No results found for this basename: PROBNP,  in the last 8760 hours CBG:  Recent Labs Lab 01/15/14 1956 01/20/14 1700 01/20/14 2201  GLUCAP 188* 402* 314*    Radiological Exams on Admission: Ct Head Wo Contrast  01/20/2014   CLINICAL DATA:  Headaches  EXAM: CT HEAD WITHOUT CONTRAST  TECHNIQUE: Contiguous axial images were obtained from the base of the skull through the vertex without intravenous contrast.  COMPARISON:  CT ANGIO HEAD W/CM &/OR WO/CM dated 01/15/2014; CT HEAD W/O CM dated 01/15/2014; MR HEAD W/O CM dated 01/15/2014  FINDINGS: There is no evidence of mass effect, midline shift or extra-axial fluid collections. There is no evidence of a space-occupying lesion or intracranial hemorrhage. There is no evidence of a cortical-based area of acute infarction. 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 There is periventricular white matter low attenuation likely secondary to microangiopathy.  The ventricles and sulci are appropriate for the patient's age. The basal cisterns are patent.  Visualized portions of the orbits are unremarkable. Small right maxillary sinus air-fluid level and bilateral ethmoid sinus mucosal thickening.  The osseous structures are unremarkable.  IMPRESSION: 1. No acute intracranial pathology. 2. Sinus disease as described above.   Electronically Signed   By: Kathreen Devoid   On:  01/20/2014 17:02   Mr Brain Wo Contrast  01/20/2014   CLINICAL DATA:  Headache and left-sided facial droop.  Code stroke.  EXAM: MRI HEAD WITHOUT CONTRAST  TECHNIQUE: Multiplanar, multiecho pulse sequences of the brain and surrounding structures were obtained without intravenous contrast.  COMPARISON:  CT HEAD W/O CM dated 01/20/2014; MR HEAD W/O CM dated 01/15/2014  FINDINGS: There is no evidence of acute infarct. Again seen is a remote left occipital lobe infarct with small amount of associated old blood products. Dilated perivascular space in the left basal ganglia is unchanged. Scattered, small foci of T2 hyperintensity within the subcortical and deep cerebral white matter do not appear significantly changed and are compatible with mild chronic small vessel ischemic disease. Mild age-related cerebral atrophy is present. There is no evidence of mass, midline shaft, or extra-axial fluid collection.  Orbits are unremarkable. Small amount of fluid remains in the right maxillary sinus. Mild right ethmoid air cell mucosal thickening is noted. Mastoid air cells are clear. Major intracranial vascular flow voids are preserved.  IMPRESSION: 1. No evidence of acute intracranial abnormality. 2. Unchanged, remote left occipital lobe infarct and mild chronic small vessel ischemic disease.   Electronically Signed   By: Logan Bores   On: 01/20/2014 21:27   Dg Chest Port 1 View  01/20/2014   CLINICAL DATA:  Facial droop with headaches. History of hypertension and diabetes.  EXAM: PORTABLE CHEST - 1 VIEW  COMPARISON:  CT CHEST W/O CM dated 10/10/2013; DG CHEST 2 VIEW dated 09/30/2013  FINDINGS: 1744 hr. The heart size and mediastinal contours are normal. The lungs are clear. There is no pleural effusion or pneumothorax. No acute osseous findings are identified. Telemetry leads overlie the chest.  IMPRESSION: Stable examination.  No active cardiopulmonary process.   Electronically Signed   By: Camie Patience M.D.   On: 01/20/2014  18:08     Assessment/Plan Principal Problem:   Headache Active Problems:   HTN (hypertension)   Hyperlipidemia   Diabetes type 2, uncontrolled   1. Headache with facial pain - at this time I have discussed with on-call neurologist Dr. Doy Mince and have started patient on Neurontin 300 mg by mouth at bedtime  along with IV Dilaudid for pain control.Trigeminal neuralgia in the differential.Neurology to follow. Since patient is still complaining of right upper extremity numbness I have ordered MRI of the C-spine.  2. Uncontrolled diabetes mellitus probably from recent addition of prednisone for Bell's palsy - I have placed patient on Lantus 5 units at bedtime for now. Check hemoglobin A1c. Hold metformin. Gently hydrate. 3. Hypertension - I am holding off diuretics for now. Gently hydrate. 4. Hyperlipidemia - continue statins. 5. Right-sided Bell's palsy - continue prednisone and acyclovir. I have discussed with pharmacy about the dose of prednisone and acyclovir. 6. Mildly elevated LFTs - follow LFTs. LFTs are improved from recent. If there is no significant worsening of her LFTs further workup as outpatient.  I have reviewed patient's old charts and discussed with on-call neurologist.  Code Status: Full code.  Family Communication: Son and daughter.  Disposition Plan: Admit for observation.    Quantavis Obryant N. [facility] Pager [phone].  If 7PM-7AM, please contact night-coverage [email or web address] Password [facility] 01/20/2014, 10:51 PM

## 2014-01-20 NOTE — ED Notes (Signed)
Pt  Reports   Seen      Er     5  Days  Ago  For  Bells     Palsy              She  Reports    Headache  Dizzy          Nauseated    And  Weakness          Symptoms  Gradually  Getting  Worse                Family  Members    At  Bedside    Pt  Ambulated  To  Room    Pt  Reports  Increased    Pain  r  Side  Face       With  Some numbness  As well         Hand  Grips  Are  Moderate  To  Weak           Skin is  Warm and  Dry

## 2014-01-20 NOTE — Code Documentation (Signed)
Code stroke called at 1703.  Patient diagnosed with bell's palsy and was in ED last week.  Came to the hospital today due to headache unrelieved by prescribed pain medicine that was given.  While in Ct scan at 1655, patient stopped speaking.  Nihss 4, not speaking but able to write out answers.  Patient ordered for MRI

## 2014-01-20 NOTE — ED Provider Notes (Signed)
Medical screening examination/treatment/procedure(s) were performed by resident physician or non-physician practitioner and as supervising physician I was immediately available for consultation/collaboration.   KINDL,JAMES DOUGLAS MD.   James D Kindl, MD 01/20/14 1834 

## 2014-01-20 NOTE — ED Provider Notes (Signed)
CSN: [id number]     Arrival date & time 01/20/14  1320 History   First MD Initiated Contact with Patient 01/20/14 1447     Chief Complaint  Patient presents with  . Weakness   (Consider location/radiation/quality/duration/timing/severity/associated sxs/prior Treatment) HPI Comments: 58 year old female presents for evaluation of right-sided facial droop, nausea, weakness, headache. She was seen in the emergency room 5 days ago and was diagnosed with Bell's palsy. Since that time, her pain has gotten worse. The weakness in her right upper shin he has gotten worse, and the dizziness has gotten worse. She has been taking all medications as prescribed without any relief. She has a history of a stroke. She does not have any recent history of fall. She takes aspirin but no other blood thinners.  Patient is a 58 y.o. female presenting with weakness.  Weakness Associated symptoms include headaches. Pertinent negatives include no chest pain, no abdominal pain and no shortness of breath.    Past Medical History  Diagnosis Date  . Breast cancer   . HTN (hypertension)   . Diabetes mellitus   . Cholelithiasis   . Hepatic steatosis   . Abdominal pain   . History of breast cancer 06/01/2012  . Arthralgia 08/13/2013  . Chest pain 09/30/2013  . Stroke 2006   Past Surgical History  Procedure Laterality Date  . Tubal ligation    . Breast lumpectomy  2010   Family History  Problem Relation Age of Onset  . Thyroid cancer Sister     thyroid   History  Substance Use Topics  . Smoking status: Never Smoker   . Smokeless tobacco: Not on file  . Alcohol Use: No   OB History   Grav Para Term Preterm Abortions TAB SAB Ect Mult Living   6 6 6       6      Review of Systems  Constitutional: Negative for fever and chills.  Eyes: Negative for visual disturbance.  Respiratory: Negative for cough and shortness of breath.   Cardiovascular: Negative for chest pain, palpitations and leg swelling.   Gastrointestinal: Negative for nausea, vomiting and abdominal pain.  Endocrine: Negative for polydipsia and polyuria.  Genitourinary: Negative for dysuria, urgency and frequency.  Musculoskeletal: Negative for arthralgias and myalgias.  Skin: Negative for rash.  Neurological: Positive for dizziness, facial asymmetry, speech difficulty, weakness, light-headedness, numbness and headaches.    Allergies  Gadolinium derivatives  Home Medications   Current Outpatient Rx  Name  Route  Sig  Dispense  Refill  . acyclovir (ZOVIRAX) 200 MG capsule   Oral   Take 2 capsules (400 mg total) by mouth 5 (five) times daily. Five times daily for ten days   50 capsule   0   . aspirin EC 81 MG tablet   Oral   Take 81 mg by mouth daily.         Marland Kitchen atorvastatin (LIPITOR) 10 MG tablet   Oral   Take 10 mg by mouth daily.         Marland Kitchen buPROPion (WELLBUTRIN SR) 150 MG 12 hr tablet   Oral   Take 150 mg by mouth daily.         . calcium-vitamin D (OSCAL WITH D) 500-200 MG-UNIT per tablet   Oral   Take 1 tablet by mouth daily with breakfast.         . HYDROcodone-acetaminophen (NORCO/VICODIN) 5-325 MG per tablet   Oral   Take 1 tablet by mouth every 6 (six)  hours as needed.   15 tablet   0   . HYDROcodone-acetaminophen (NORCO/VICODIN) 5-325 MG per tablet   Oral   Take 1 tablet by mouth every 6 (six) hours as needed.   15 tablet   0   . ibuprofen (ADVIL,MOTRIN) 800 MG tablet   Oral   Take 800 mg by mouth every 8 (eight) hours as needed for pain.         Marland Kitchen letrozole (FEMARA) 2.5 MG tablet   Oral   Take 2.5 mg by mouth daily.         Marland Kitchen lisinopril-hydrochlorothiazide (PRINZIDE,ZESTORETIC) 20-12.5 MG per tablet   Oral   Take 1 tablet by mouth daily.         . meclizine (ANTIVERT) 25 MG tablet   Oral   Take 1 tablet (25 mg total) by mouth 2 (two) times daily as needed for dizziness.   30 tablet   0   . metFORMIN (GLUCOPHAGE) 850 MG tablet   Oral   Take 425-850 mg by mouth  2 (two) times daily with a meal. Takes 850mg  in the morning and 425mg  in the evening         . Multiple Vitamins-Minerals (MULTIVITAMIN WITH MINERALS) tablet   Oral   Take 1 tablet by mouth daily.         . ondansetron (ZOFRAN) 4 MG tablet   Oral   Take 1 tablet (4 mg total) by mouth every 6 (six) hours.   12 tablet   0   . predniSONE (DELTASONE) 20 MG tablet   Oral   Take 3 tablets (60 mg total) by mouth stat. For ten days   30 tablet   0    BP 159/83  Pulse 84  Temp(Src) 98.8 F (37.1 C) (Oral)  Resp 16  SpO2 98% Physical Exam  Nursing note and vitals reviewed. Constitutional: She is oriented to person, place, and time. Vital signs are normal. She appears well-developed and well-nourished. No distress.  HENT:  Head: Normocephalic and atraumatic.  Eyes: EOM are normal. Pupils are equal, round, and reactive to light.  Lid lag on the right from facial droop  Pulmonary/Chest: Effort normal. No respiratory distress.  Neurological: She is alert and oriented to person, place, and time. A cranial nerve deficit is present. She exhibits abnormal muscle tone. Coordination and gait normal. GCS eye subscore is 4. GCS verbal subscore is 5. GCS motor subscore is 6.  Facial droop on the right involving all branches of the facial nerve. PERRL, Right eye not accommodating. Decreased hearing on the right. Asymmetric rise of the soft palate. Extraocular movements intact. Decreased grip strength on the right. lower extremity strength equal bilaterally.  Skin: Skin is warm and dry. No rash noted. She is not diaphoretic.  Psychiatric: She has a normal mood and affect. Judgment normal.    ED Course  Procedures (including critical care time) Labs Review Labs Reviewed - No data to display Imaging Review No results found.   MDM   1. Facial droop   2. Weakness    This patient's presentation and exam are atypical for Bell's palsy. She has a cranial nerve deficits involving almost all  cranial nerves, with emphasis on the right. She also has right upper extremity weakness. She has a history of CVA. Symptoms began 5 days ago. She is being transferred to the ED via shuttle for further evaluation of her symptoms. She will probably need repeat imaging  Liam Graham, PA-C 01/20/14  1514 

## 2014-01-20 NOTE — ED Notes (Addendum)
Pt brought back from CT, CT tech states pt left face started to "droop and pt cannot speak". Upon arrival pt grabbing at her mouth and telling family she cannot speak. Dr. Curly Rim and RN at bedside, code stroke called.

## 2014-01-20 NOTE — ED Provider Notes (Signed)
CSN: [id number]     Arrival date & time 01/20/14  1537 History   First MD Initiated Contact with Patient 01/20/14 1608     Chief Complaint  Patient presents with  . Headache  . Facial Droop     (Consider location/radiation/quality/duration/timing/severity/associated sxs/prior Treatment) HPI Comments: 58 year old Spanish-speaking female presents emergency department with chief complaint of severe right sided headache and facial pain and right-sided facial droop. She also complains of dizziness upon standing, persistent nausea, and decreased sensation the right lower extremity.  Patient was transferred to the ED from urgent care clinic for further workup of her symptoms. She was seen in the emergency department approximately 5 days ago and diagnosed with Bell's palsy. She had a CT of the head and MRI were obtained.  Case was discussed with neurology and the patient was discharged with treatment for Bell's palsy.  MRI revealed: . No acute intracranial abnormality. 2. Left occipital lobe infarct is new since 2007 but is not acute. There are some remote punctate blood products associated with this infarct. 3. Slight progression in periventricular and subcortical white matter disease, likely reflecting progressive microvascular ischemia.   Patient has past medical history significant for breast cancer, hypertension, diabetes, stroke, arthralgias, cholelithiasis, and chronic abdominal pain.  Patient is a 58 y.o. female presenting with headaches. The history is provided by the patient. The history is limited by a language barrier. Language interpreter used: The patient's son served as a Optometrist for initial history, however a formal  translation will be performed by phone translator.  Headache Pain location:  R parietal and R temporal Quality:  Sharp Radiates to:  Face Severity currently:  9/10 Severity at highest:  9/10 Onset quality:  Gradual Duration:  6 days Timing:   Constant Progression:  Worsening Chronicity:  Recurrent Similar to prior headaches: yes   Relieved by: Patient is taking Norco, Wellbutrin, ibuprofen, Associated symptoms: abdominal pain, dizziness, focal weakness, nausea and numbness   Associated symptoms: no back pain, no blurred vision, no congestion, no cough, no diarrhea, no drainage, no ear pain, no fatigue, no fever, no hearing loss, no loss of balance, no myalgias, no near-syncope, no photophobia, no seizures, no sinus pressure and no vomiting     Past Medical History  Diagnosis Date  . Breast cancer   . HTN (hypertension)   . Diabetes mellitus   . Cholelithiasis   . Hepatic steatosis   . Abdominal pain   . History of breast cancer 06/01/2012  . Arthralgia 08/13/2013  . Chest pain 09/30/2013  . Stroke 2006   Past Surgical History  Procedure Laterality Date  . Tubal ligation    . Breast lumpectomy  2010   Family History  Problem Relation Age of Onset  . Thyroid cancer Sister     thyroid   History  Substance Use Topics  . Smoking status: Never Smoker   . Smokeless tobacco: Not on file  . Alcohol Use: No   OB History   Grav Para Term Preterm Abortions TAB SAB Ect Mult Living   6 6 6       6      Review of Systems  Constitutional: Positive for activity change. Negative for fever, chills, diaphoresis, appetite change, fatigue and unexpected weight change.  HENT: Negative for congestion, drooling, ear discharge, ear pain, facial swelling, hearing loss, nosebleeds, postnasal drip, sinus pressure and sneezing.        R facial droop and facial pain  Eyes: Negative for blurred vision and photophobia.  R eye d/c   Respiratory: Negative.  Negative for cough.   Cardiovascular: Negative.  Negative for near-syncope.  Gastrointestinal: Positive for nausea and abdominal pain. Negative for vomiting, diarrhea, constipation and blood in stool.       Chronic ABD pain  Endocrine: Negative.   Genitourinary: Negative.    Musculoskeletal: Positive for gait problem. Negative for back pain and myalgias.       Dizzy on standing and ambulation  Skin: Negative.   Neurological: Positive for dizziness, focal weakness, facial asymmetry, numbness and headaches. Negative for seizures, light-headedness and loss of balance.      Allergies  Gadolinium derivatives  Home Medications   Current Outpatient Rx  Name  Route  Sig  Dispense  Refill  . acyclovir (ZOVIRAX) 200 MG capsule   Oral   Take 2 capsules (400 mg total) by mouth 5 (five) times daily. Five times daily for ten days   50 capsule   0   . aspirin EC 81 MG tablet   Oral   Take 81 mg by mouth daily.         Marland Kitchen atorvastatin (LIPITOR) 10 MG tablet   Oral   Take 10 mg by mouth daily.         Marland Kitchen buPROPion (WELLBUTRIN SR) 150 MG 12 hr tablet   Oral   Take 150 mg by mouth daily.         . calcium-vitamin D (OSCAL WITH D) 500-200 MG-UNIT per tablet   Oral   Take 1 tablet by mouth daily with breakfast.         . HYDROcodone-acetaminophen (NORCO/VICODIN) 5-325 MG per tablet   Oral   Take 1 tablet by mouth every 6 (six) hours as needed.   15 tablet   0   . HYDROcodone-acetaminophen (NORCO/VICODIN) 5-325 MG per tablet   Oral   Take 1 tablet by mouth every 6 (six) hours as needed.   15 tablet   0   . ibuprofen (ADVIL,MOTRIN) 800 MG tablet   Oral   Take 800 mg by mouth every 8 (eight) hours as needed for pain.         Marland Kitchen letrozole (FEMARA) 2.5 MG tablet   Oral   Take 2.5 mg by mouth daily.         Marland Kitchen lisinopril-hydrochlorothiazide (PRINZIDE,ZESTORETIC) 20-12.5 MG per tablet   Oral   Take 1 tablet by mouth daily.         . meclizine (ANTIVERT) 25 MG tablet   Oral   Take 1 tablet (25 mg total) by mouth 2 (two) times daily as needed for dizziness.   30 tablet   0   . metFORMIN (GLUCOPHAGE) 850 MG tablet   Oral   Take 425-850 mg by mouth 2 (two) times daily with a meal. Takes 850mg  in the morning and 425mg  in the evening          . Multiple Vitamins-Minerals (MULTIVITAMIN WITH MINERALS) tablet   Oral   Take 1 tablet by mouth daily.         . ondansetron (ZOFRAN) 4 MG tablet   Oral   Take 1 tablet (4 mg total) by mouth every 6 (six) hours.   12 tablet   0   . predniSONE (DELTASONE) 20 MG tablet   Oral   Take 3 tablets (60 mg total) by mouth stat. For ten days   30 tablet   0    BP 184/104  Pulse 81  Temp(Src) 98.4  F (36.9 C) (Oral)  Resp 18  SpO2 98% Physical Exam  Nursing note and vitals reviewed. Constitutional: She is oriented to person, place, and time. She appears well-developed and well-nourished. She appears distressed.  HENT:  Head: Normocephalic and atraumatic.  Mouth/Throat: Oropharynx is clear and moist.  Eyes: Conjunctivae are normal. Pupils are equal, round, and reactive to light. Right eye exhibits normal extraocular motion and no nystagmus. Left eye exhibits normal extraocular motion and no nystagmus.    Neck: Normal range of motion. Neck supple. No JVD present.  Cardiovascular: Normal rate and regular rhythm.   Pulmonary/Chest: Effort normal and breath sounds normal. No stridor. She has no wheezes. She has no rales.  Abdominal: Soft. Bowel sounds are normal. She exhibits no distension and no mass. There is no tenderness. There is no rebound and no guarding.  Musculoskeletal: Normal range of motion.  Neurological: She is alert and oriented to person, place, and time. She has normal strength and normal reflexes. A cranial nerve deficit and sensory deficit is present. GCS eye subscore is 4. GCS verbal subscore is 5. GCS motor subscore is 6.  Abnormal F to N - past pointing noted Subjective RLE paresthesias compared to LLE Able to lift heels off bed (SLR) bilat Unable to test Romberg  Skin: Skin is warm and dry.    ED Course  Procedures (including critical care time) Labs Review Labs Reviewed  CBC WITH DIFFERENTIAL  COMPREHENSIVE METABOLIC PANEL  TROPONIN I  URINALYSIS,  DIPSTICK ONLY  PREGNANCY, URINE   Imaging Review No results found.   EKG Interpretation   Date/Time:  Monday January 20 2014 16:18:21 EST Ventricular Rate:  75 PR Interval:  134 QRS Duration: 94 QT Interval:  396 QTC Calculation: 442 R Axis:   12 Text Interpretation:  Sinus rhythm Confirmed by MASNERI  MD, DAVID ([id number])  on 01/20/2014 4:32:38 PM      MDM   Final diagnoses:  Facial droop  Uncontrolled diabetes mellitus  Headache   58 year old Spanish-speaking female presents emergency department with severe right sided headache and facial pain with right facial droop, and subjective right lower extremity decreased sensation. Patient also complains of dizziness which is worse on standing. ER workup 5 days ago consistent with Bell's palsy and the patient was discharged with treatment for Bell's palsy. She was seen today in urgent care with worsening symptoms and transferred to the emergency department for further workup. Plan to obtain CT head, basic lab work, and treat symptoms. Will reengage with neurology upon completion of evaluation.  While in radiology patient's condition worsened. She had a new difficulty in speaking. This was due to weakness in the back of her mouth left and right side. Interpreter was called and history was confirmed and a code stroke was called immediately upon her return to the E pod.  CODE STROKE: Patient was evaluated by Dr. Armida Sans who states her NIH score is 2. He also states she has a variable exam. He recommended obtaining an MRI. His MRI negative admit to medicine. His MRI positive call neurology. No indications for lytics at this time as patient's exam is variable and her NIH score is 2.  MRI negative.  Pt still has persistent pain. Morphine written for.  Delay in obtaining VBG.   BG at 402 at 1700.  Will give normal saline bolus. Internal medicine consult. I spoke with Dr. Raliegh Ip.  he requested an ABG and i-STAT chemistry. ABG reveals pH of 7.4 PCO2 40  bicarbonate 25.9. No  evidence of acidosis at this time. Doubt DKA. I-STAT chemistry reveals blood glucose at 300 otherwise no abnormalities. Hold on insulin as blood glucose will likely respond to IV fluid bolus.  Admit patient for persistent symptoms. Further workup per internal medicine   Elmer Sow, MD 01/20/14 2303

## 2014-01-21 ENCOUNTER — Observation Stay (HOSPITAL_COMMUNITY): Payer: No Typology Code available for payment source

## 2014-01-21 DIAGNOSIS — R51 Headache: Secondary | ICD-10-CM

## 2014-01-21 DIAGNOSIS — IMO0001 Reserved for inherently not codable concepts without codable children: Secondary | ICD-10-CM

## 2014-01-21 DIAGNOSIS — N390 Urinary tract infection, site not specified: Secondary | ICD-10-CM

## 2014-01-21 DIAGNOSIS — G510 Bell's palsy: Secondary | ICD-10-CM

## 2014-01-21 DIAGNOSIS — E1165 Type 2 diabetes mellitus with hyperglycemia: Secondary | ICD-10-CM

## 2014-01-21 DIAGNOSIS — Z853 Personal history of malignant neoplasm of breast: Secondary | ICD-10-CM

## 2014-01-21 DIAGNOSIS — I10 Essential (primary) hypertension: Secondary | ICD-10-CM

## 2014-01-21 DIAGNOSIS — K760 Fatty (change of) liver, not elsewhere classified: Secondary | ICD-10-CM | POA: Diagnosis present

## 2014-01-21 LAB — CBC WITH DIFFERENTIAL/PLATELET
BASOS ABS: 0 10*3/uL (ref 0.0–0.1)
Basophils Relative: 0 % (ref 0–1)
EOS PCT: 0 % (ref 0–5)
Eosinophils Absolute: 0 10*3/uL (ref 0.0–0.7)
HEMATOCRIT: 38.2 % (ref 36.0–46.0)
Hemoglobin: 13.3 g/dL (ref 12.0–15.0)
LYMPHS PCT: 14 % (ref 12–46)
Lymphs Abs: 0.9 10*3/uL (ref 0.7–4.0)
MCH: 30.4 pg (ref 26.0–34.0)
MCHC: 34.8 g/dL (ref 30.0–36.0)
MCV: 87.2 fL (ref 78.0–100.0)
Monocytes Absolute: 0.5 10*3/uL (ref 0.1–1.0)
Monocytes Relative: 7 % (ref 3–12)
Neutro Abs: 4.9 10*3/uL (ref 1.7–7.7)
Neutrophils Relative %: 78 % — ABNORMAL HIGH (ref 43–77)
Platelets: 170 10*3/uL (ref 150–400)
RBC: 4.38 MIL/uL (ref 3.87–5.11)
RDW: 13.2 % (ref 11.5–15.5)
WBC: 6.2 10*3/uL (ref 4.0–10.5)

## 2014-01-21 LAB — COMPREHENSIVE METABOLIC PANEL
ALBUMIN: 3.2 g/dL — AB (ref 3.5–5.2)
ALK PHOS: 89 U/L (ref 39–117)
ALT: 41 U/L — AB (ref 0–35)
AST: 44 U/L — AB (ref 0–37)
BUN: 15 mg/dL (ref 6–23)
CHLORIDE: 102 meq/L (ref 96–112)
CO2: 26 mEq/L (ref 19–32)
Calcium: 8.8 mg/dL (ref 8.4–10.5)
Creatinine, Ser: 0.59 mg/dL (ref 0.50–1.10)
GFR calc Af Amer: >90 mL/min (ref >90)
GFR calc non Af Amer: >90 mL/min (ref >90)
Glucose, Bld: 275 mg/dL — ABNORMAL HIGH (ref 70–99)
Potassium: 4.3 mEq/L (ref 3.7–5.3)
SODIUM: 141 meq/L (ref 137–147)
TOTAL PROTEIN: 6.8 g/dL (ref 6.0–8.3)
Total Bilirubin: 0.7 mg/dL (ref 0.3–1.2)

## 2014-01-21 LAB — HEMOGLOBIN A1C
Hgb A1c MFr Bld: 9 % — ABNORMAL HIGH (ref <5.7)
Mean Plasma Glucose: 212 mg/dL — ABNORMAL HIGH (ref <117)

## 2014-01-21 LAB — GLUCOSE, CAPILLARY
Glucose-Capillary: 250 mg/dL — ABNORMAL HIGH (ref 70–99)
Glucose-Capillary: 334 mg/dL — ABNORMAL HIGH (ref 70–99)
Glucose-Capillary: 281 mg/dL — ABNORMAL HIGH (ref 70–99)
Glucose-Capillary: 209 mg/dL — ABNORMAL HIGH (ref 70–99)

## 2014-01-21 MED ORDER — DOCUSATE SODIUM 100 MG PO CAPS
100.0000 mg | ORAL_CAPSULE | Freq: Two times a day (BID) | ORAL | Status: DC
  Administered 2014-01-21 (×2): 100 mg via ORAL
  Filled 2014-01-21 (×2): qty 1

## 2014-01-21 MED ORDER — INSULIN ASPART 100 UNIT/ML ~~LOC~~ SOLN
0.0000 [IU] | Freq: Every day | SUBCUTANEOUS | Status: DC
  Administered 2014-01-21: 2 [IU] via SUBCUTANEOUS

## 2014-01-21 MED ORDER — OXYCODONE HCL 5 MG PO TABS
5.0000 mg | ORAL_TABLET | ORAL | Status: DC | PRN
  Administered 2014-01-21 (×2): 10 mg via ORAL
  Administered 2014-01-22: 5 mg via ORAL
  Filled 2014-01-21 (×3): qty 2
  Filled 2014-01-21: qty 1

## 2014-01-21 MED ORDER — CARBAMAZEPINE 100 MG PO CHEW
100.0000 mg | CHEWABLE_TABLET | Freq: Three times a day (TID) | ORAL | Status: DC
  Administered 2014-01-21 – 2014-01-22 (×5): 100 mg via ORAL
  Filled 2014-01-21 (×6): qty 1

## 2014-01-21 MED ORDER — PNEUMOCOCCAL VAC POLYVALENT 25 MCG/0.5ML IJ INJ
0.5000 mL | INJECTION | INTRAMUSCULAR | Status: DC
  Filled 2014-01-21: qty 0.5

## 2014-01-21 MED ORDER — SULFAMETHOXAZOLE-TRIMETHOPRIM 400-80 MG PO TABS
1.0000 | ORAL_TABLET | Freq: Two times a day (BID) | ORAL | Status: DC
  Administered 2014-01-21 – 2014-01-22 (×3): 1 via ORAL
  Filled 2014-01-21 (×5): qty 1

## 2014-01-21 MED ORDER — MAGNESIUM HYDROXIDE 400 MG/5ML PO SUSP
30.0000 mL | Freq: Every day | ORAL | Status: AC
  Administered 2014-01-21 – 2014-01-22 (×2): 30 mL via ORAL
  Filled 2014-01-21 (×2): qty 30

## 2014-01-21 MED ORDER — INFLUENZA VAC SPLIT QUAD 0.5 ML IM SUSP
0.5000 mL | INTRAMUSCULAR | Status: DC
  Filled 2014-01-21: qty 0.5

## 2014-01-21 NOTE — Progress Notes (Signed)
NEURO HOSPITALIST PROGRESS NOTE   SUBJECTIVE:                                                                                                                        Having left face pain at the time of this visit. Language is back to normal. MRI brain showed no acute abnormality. Received gabapentin 300 mg last night and dilaudid 1 mg PRN. She tells me that the left facial pain " just started yesterday". It is a severe, lancinating, intermittent sharp pain that last 2-5 minutes, mainly involves the left temple-cheek area, and is triggered by talking, touching the left face, or cold temperature. No nasal congestion, left eye droopiness, or increased tearing noted during this painful attack.  OBJECTIVE:                                                                                                                           Vital signs in last 24 hours: Temp:  [97.3 F (36.3 C)-98.8 F (37.1 C)] 97.3 F (36.3 C) (03/03 0526) Pulse Rate:  [55-86] 55 (03/03 0526) Resp:  [14-21] 18 (03/03 0526) BP: (125-184)/(58-104) 132/64 mmHg (03/03 0526) SpO2:  [96 %-98 %] 96 % (03/03 0526) Weight:  [66.225 kg (146 lb)] 66.225 kg (146 lb) (03/03 0010)  Intake/Output from previous day: 03/02 0701 - 03/03 0700 In: 663.8 [P.O.:240; I.V.:423.8] Out: -  Intake/Output this shift:   Nutritional status:    Past Medical History  Diagnosis Date  . Breast cancer   . HTN (hypertension)   . Diabetes mellitus   . Cholelithiasis   . Hepatic steatosis   . Abdominal pain   . History of breast cancer 06/01/2012  . Arthralgia 08/13/2013  . Chest pain 09/30/2013  . Stroke 2006    Neurologic Exam:  Mental Status:  Alert, awake,oriented to place-year-month-situation. Comprehension intact, naming, repetition intact. Language is fluent, no dysarthria. Cranial Nerves:  II: Discs flat bilaterally; Visual fields grossly normal, pupils equal, round, reactive to light and  accommodation  III,IV, VI: ptosis not present, extra-ocular motions intact bilaterally  V,VII: smile asymmetric with right facial weakness peripheral type, facial light touch sensation diminished on the left ? VIII: hearing normal bilaterally  IX,X: gag reflex present  XI: bilateral shoulder shrug  XII: midline tongue extension without atrophy or fasciculations  Motor:  Right : Upper extremity 5/5 Left: Upper extremity 5/5  Lower extremity 5/5 Lower extremity 5/5  Tone and bulk:normal tone throughout; no atrophy noted  Sensory: Pinprick and light touch is very inconsistent  Deep Tendon Reflexes:  Right: Upper Extremity Left: Upper extremity  biceps (C-5 to C-6) 2/4 biceps (C-5 to C-6) 2/4  tricep (C7) 2/4 triceps (C7) 2/4  Brachioradialis (C6) 2/4 Brachioradialis (C6) 2/4  Lower Extremity Lower Extremity  quadriceps (L-2 to L-4) 2/4 quadriceps (L-2 to L-4) 2/4  Achilles (S1) 2/4 Achilles (S1) 2/4  Plantars:  Right: downgoing Left: downgoing  Cerebellar:  normal finger-to-nose, normal heel-to-shin test  Gait:  No tested.  CV: pulses palpable throughout    Lab Results: Lab Results  Component Value Date/Time   CHOL 158 10/28/2013 11:40 AM   Lipid Panel No results found for this basename: CHOL, TRIG, HDL, CHOLHDL, VLDL, LDLCALC,  in the last 72 hours  Studies/Results: Ct Head Wo Contrast  01/20/2014   CLINICAL DATA:  Headaches  EXAM: CT HEAD WITHOUT CONTRAST  TECHNIQUE: Contiguous axial images were obtained from the base of the skull through the vertex without intravenous contrast.  COMPARISON:  CT ANGIO HEAD W/CM &/OR WO/CM dated 01/15/2014; CT HEAD W/O CM dated 01/15/2014; MR HEAD W/O CM dated 01/15/2014  FINDINGS: There is no evidence of mass effect, midline shift or extra-axial fluid collections. There is no evidence of a space-occupying lesion or intracranial hemorrhage. There is no evidence of a cortical-based area of acute infarction. 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 There is  periventricular white matter low attenuation likely secondary to microangiopathy.  The ventricles and sulci are appropriate for the patient's age. The basal cisterns are patent.  Visualized portions of the orbits are unremarkable. Small right maxillary sinus air-fluid level and bilateral ethmoid sinus mucosal thickening.  The osseous structures are unremarkable.  IMPRESSION: 1. No acute intracranial pathology. 2. Sinus disease as described above.   Electronically Signed   By: Kathreen Devoid   On: 01/20/2014 17:02   Mr Brain Wo Contrast  01/20/2014   CLINICAL DATA:  Headache and left-sided facial droop.  Code stroke.  EXAM: MRI HEAD WITHOUT CONTRAST  TECHNIQUE: Multiplanar, multiecho pulse sequences of the brain and surrounding structures were obtained without intravenous contrast.  COMPARISON:  CT HEAD W/O CM dated 01/20/2014; MR HEAD W/O CM dated 01/15/2014  FINDINGS: There is no evidence of acute infarct. Again seen is a remote left occipital lobe infarct with small amount of associated old blood products. Dilated perivascular space in the left basal ganglia is unchanged. Scattered, small foci of T2 hyperintensity within the subcortical and deep cerebral white matter do not appear significantly changed and are compatible with mild chronic small vessel ischemic disease. Mild age-related cerebral atrophy is present. There is no evidence of mass, midline shaft, or extra-axial fluid collection.  Orbits are unremarkable. Small amount of fluid remains in the right maxillary sinus. Mild right ethmoid air cell mucosal thickening is noted. Mastoid air cells are clear. Major intracranial vascular flow voids are preserved.  IMPRESSION: 1. No evidence of acute intracranial abnormality. 2. Unchanged, remote left occipital lobe infarct and mild chronic small vessel ischemic disease.   Electronically Signed   By: Logan Bores   On: 01/20/2014 21:27   Dg Chest Port 1 View  01/20/2014   CLINICAL DATA:  Facial droop with headaches.  History of hypertension and diabetes.  EXAM: PORTABLE CHEST - 1 VIEW  COMPARISON:  CT CHEST W/O CM dated 10/10/2013; DG CHEST 2 VIEW dated 09/30/2013  FINDINGS: 1744 hr. The heart size and mediastinal contours are normal. The lungs are clear. There is no pleural effusion or pneumothorax. No acute osseous findings are identified. Telemetry leads overlie the chest.  IMPRESSION: Stable examination.  No active cardiopulmonary process.   Electronically Signed   By: Camie Patience M.D.   On: 01/20/2014 18:08    MEDICATIONS                                                                                                                       I have reviewed the patient's current medications.  ASSESSMENT/PLAN:                                                                                                           1) Acute functional language disturbance resolved. MRI brain unremarkable 2) New onset short lasting unilateral left face pain with a pattern consistent with left trigeminal neuralgia. Start carbamazepine 100 mg TID with further dose adjustments and surveillance for hyponatremia as outpatient.  3) continue Dilaudid PRN. Will follow up while in the hospital.   Dorian Pod, MD [facility] [phone]  01/21/2014, 9:40 AM

## 2014-01-21 NOTE — Progress Notes (Signed)
Chart reviewed. Discussed with Dr. Hal Hope.   [facility] PROGRESS NOTE  Brandi Bryant WHQ:[id number] DOB: Jun 18, 1956 DOA: 01/20/2014 PCP: Pcp Not In System  Assessment/Plan:  Principal Problem:   Left sided Headache/facial pain. Likely trigeminal neuralgia.  Started on gabapentin and IV dilaudid.  Consider tegretol. Await neuro recs. Active Problems: Weakness: per neuro note, may be functional. Exam inconsistent.  Dr. Hal Hope ordered MRI C spine. May be in part due to uncontrolled DM and UTI.  Will order PT, OT eval   HTN (hypertension)   History of breast cancer, in remission   Hyperlipidemia   Diabetes type 2, uncontrolled: exacerbated by prednisone.  hgb A1C pending. adust meds, continue IVF   Bell's palsy: on acyclovir and prednisone   UTI (urinary tract infection): start bactrim and check culture   Hepatic steatosis: could explain abnormal LFTs   Code Status:  full Family Communication:  Daughter at bedside Disposition Plan:  Home   Consultants:  neuro  Procedures:     Antibiotics:  Bactrim 3/3  HPI/Subjective: C/o left temporal, facial pain. Dilaudid helps. Ate a little breakfast  Objective: Filed Vitals:   01/21/14 0526  BP: 132/64  Pulse: 55  Temp: 97.3 F (36.3 C)  Resp: 18    Intake/Output Summary (Last 24 hours) at 01/21/14 0837 Last data filed at 01/21/14 0600  Gross per 24 hour  Intake 663.75 ml  Output      0 ml  Net 663.75 ml   Filed Weights   01/21/14 0010  Weight: 66.225 kg (146 lb)    Exam:   General:  Alert. Poor eye contact. Speaks some english. Cooperative  HEENT: right facial droop, ptosis. Left facial tenderness  Cardiovascular: RRR without MGR  Respiratory: CTA without WRR  Abdomen: S, NT, ND  Ext: no CCE  Motor 5/5 throughout with poor effort  Basic Metabolic Panel:  Recent Labs Lab 01/15/14 1757 01/15/14 1820 01/20/14 1621 01/20/14 2224 01/21/14 0445  NA 141 140 135* 143 141  K  3.6* 3.3* 4.6 4.1 4.3  CL 100 104 96 101 102  CO2 22  --  22  --  26  GLUCOSE 225* 234* 426* 300* 275*  BUN 13 12 18 16 15   CREATININE 0.73 0.70 0.67 0.80 0.59  CALCIUM 9.3  --  9.3  --  8.8   Liver Function Tests:  Recent Labs Lab 01/15/14 1757 01/20/14 1621 01/21/14 0445  AST 116* 72* 44*  ALT 62* 54* 41*  ALKPHOS 117 117 89  BILITOT 0.5 0.7 0.7  PROT 7.9 8.0 6.8  ALBUMIN 3.9 3.7 3.2*   No results found for this basename: LIPASE, AMYLASE,  in the last 168 hours No results found for this basename: AMMONIA,  in the last 168 hours CBC:  Recent Labs Lab 01/15/14 1757 01/15/14 1820 01/20/14 1621 01/20/14 2224 01/21/14 0445  WBC 6.5  --  6.8  --  6.2  NEUTROABS 3.6  --  6.0  --  4.9  HGB 13.7 13.9 14.9 14.3 13.3  HCT 38.4 41.0 41.1 42.0 38.2  MCV 86.1  --  86.3  --  87.2  PLT 164  --  177  --  170   Cardiac Enzymes:  Recent Labs Lab 01/20/14 1621  TROPONINI <0.30   BNP (last 3 results) No results found for this basename: PROBNP,  in the last 8760 hours CBG:  Recent Labs Lab 01/15/14 1956 01/20/14 1700 01/20/14 2201 01/20/14 2350 01/21/14 0652  GLUCAP 188* 402* 314* 203* 250*  No results found for this or any previous visit (from the past 240 hour(s)).   Studies: Ct Head Wo Contrast  01/20/2014   CLINICAL DATA:  Headaches  EXAM: CT HEAD WITHOUT CONTRAST  TECHNIQUE: Contiguous axial images were obtained from the base of the skull through the vertex without intravenous contrast.  COMPARISON:  CT ANGIO HEAD W/CM &/OR WO/CM dated 01/15/2014; CT HEAD W/O CM dated 01/15/2014; MR HEAD W/O CM dated 01/15/2014  FINDINGS: There is no evidence of mass effect, midline shift or extra-axial fluid collections. There is no evidence of a space-occupying lesion or intracranial hemorrhage. There is no evidence of a cortical-based area of acute infarction. 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 There is periventricular white matter low attenuation likely secondary to microangiopathy.   The ventricles and sulci are appropriate for the patient's age. The basal cisterns are patent.  Visualized portions of the orbits are unremarkable. Small right maxillary sinus air-fluid level and bilateral ethmoid sinus mucosal thickening.  The osseous structures are unremarkable.  IMPRESSION: 1. No acute intracranial pathology. 2. Sinus disease as described above.   Electronically Signed   By: Kathreen Devoid   On: 01/20/2014 17:02   Mr Brain Wo Contrast  01/20/2014   CLINICAL DATA:  Headache and left-sided facial droop.  Code stroke.  EXAM: MRI HEAD WITHOUT CONTRAST  TECHNIQUE: Multiplanar, multiecho pulse sequences of the brain and surrounding structures were obtained without intravenous contrast.  COMPARISON:  CT HEAD W/O CM dated 01/20/2014; MR HEAD W/O CM dated 01/15/2014  FINDINGS: There is no evidence of acute infarct. Again seen is a remote left occipital lobe infarct with small amount of associated old blood products. Dilated perivascular space in the left basal ganglia is unchanged. Scattered, small foci of T2 hyperintensity within the subcortical and deep cerebral white matter do not appear significantly changed and are compatible with mild chronic small vessel ischemic disease. Mild age-related cerebral atrophy is present. There is no evidence of mass, midline shaft, or extra-axial fluid collection.  Orbits are unremarkable. Small amount of fluid remains in the right maxillary sinus. Mild right ethmoid air cell mucosal thickening is noted. Mastoid air cells are clear. Major intracranial vascular flow voids are preserved.  IMPRESSION: 1. No evidence of acute intracranial abnormality. 2. Unchanged, remote left occipital lobe infarct and mild chronic small vessel ischemic disease.   Electronically Signed   By: Logan Bores   On: 01/20/2014 21:27   Dg Chest Port 1 View  01/20/2014   CLINICAL DATA:  Facial droop with headaches. History of hypertension and diabetes.  EXAM: PORTABLE CHEST - 1 VIEW  COMPARISON:   CT CHEST W/O CM dated 10/10/2013; DG CHEST 2 VIEW dated 09/30/2013  FINDINGS: 1744 hr. The heart size and mediastinal contours are normal. The lungs are clear. There is no pleural effusion or pneumothorax. No acute osseous findings are identified. Telemetry leads overlie the chest.  IMPRESSION: Stable examination.  No active cardiopulmonary process.   Electronically Signed   By: Camie Patience M.D.   On: 01/20/2014 18:08    Scheduled Meds: . acyclovir  400 mg Oral 5 X Daily  . aspirin EC  81 mg Oral Daily  . atorvastatin  10 mg Oral Daily  . buPROPion  150 mg Oral Daily  . enoxaparin (LOVENOX) injection  40 mg Subcutaneous Q24H  . gabapentin  300 mg Oral QHS  . [START ON 01/22/2014] influenza vac split quadrivalent PF  0.5 mL Intramuscular Tomorrow-1000  . insulin aspart  0-5 Units  Subcutaneous QHS  . insulin aspart  0-9 Units Subcutaneous TID WC  . insulin glargine  5 Units Subcutaneous QHS  . letrozole  2.5 mg Oral Daily  . lisinopril  20 mg Oral Daily  . ondansetron  4 mg Oral Q6H  . [START ON 01/22/2014] pneumococcal 23 valent vaccine  0.5 mL Intramuscular Tomorrow-1000  . sulfamethoxazole-trimethoprim  1 tablet Oral Q12H   Continuous Infusions: . sodium chloride 1,000 mL (01/21/14 0021)    Time spent: 35 minutes  [facility] Pager [phone]. If 7PM-7AM, please contact night-coverage at [email or web address], password [facility] 01/21/2014, 8:37 AM  LOS: 1 day

## 2014-01-21 NOTE — Progress Notes (Signed)
UR completed 

## 2014-01-21 NOTE — Progress Notes (Signed)
Physical Therapy Evaluation Patient Details Name: Brandi Bryant MRN: [id number] DOB: 1956/11/20 Today's Date: 01/21/2014   History of Present Illness  Adm with left facial pain. Lost ability to speak while in CT scan. Recently diagnosed with Rt Bells Palsy. MRI head negative for acute infarct. (+old Left occipital infarct)   Clinical Impression  Pt with poor participation with recent pain medicine causing her to be drowsy. She only wanted to lie back down due to feeling dizzy (from medicine per pt). Strength seemed symmetrical, however poor effort given in legs. Minimal standing balance assessment completed before pt could no longer participate and insisted on return to bed. Will follow-up 01/22/14 if pt has not been discharged.    Follow Up Recommendations Home health PT;Supervision/Assistance - 24 hour    Equipment Recommendations   (TBA)    Recommendations for Other Services     Precautions / Restrictions Precautions Precautions: [place] expects to be discharged to:: Private residence Living Arrangements: Other relatives (sister and daughter) Available Help at Discharge: Family;Available 24 hours/day (sister and daughter) Type of Home: House Home Access: Stairs to enter Entrance Stairs-Rails: None Home Layout: One level Home Equipment: None         Prior Function Level of Independence: Independent         Comments: drives; no balance problems prior per son       Pertinent Vitals/Pain Pt reports Lt and Rt facial pain; received pain medicine just prior to PT session; patient repositioned for comfort    Communication Prefers language other than English ([place]; son present and interpreting)   Mobility  Bed Mobility Overal bed mobility: Needs Assistance Bed Mobility: Sidelying to Sit;Sit to Sidelying   Sidelying to sit: Min guard     Sit to sidelying: Mod assist General bed mobility comments: pt reaching for assist to  come to sitting, hand offered, but no assist given; on return to bed, pt's son lifted her legs back into bed  Transfers Overall transfer level: Needs assistance Equipment used: 1 person hand held assist Transfers: Sit to/from Stand Sit to Stand: Min guard;Mod assist         General transfer comment: stood from EOB with Rt hand held assist; as she stood reported dizziness increasing, she repeatedly was closing her eyes and stating she couldn't do anymore and staggered backwards with abrupt stand to sit  Ambulation/Gait             General Gait Details: unable         Pre-Morbid Rankin Score: No symptoms Modified Rankin: Moderately severe disability   Balance Overall balance assessment: Needs assistance Sitting-balance support: Single extremity supported;Feet supported Sitting balance-Leahy Scale: Poor Sitting balance - Comments: sat EOB with reports of dizziness--she felt it was due to pain medicine. Sat with stand-by assist   Standing balance support: Single extremity supported Standing balance-Leahy Scale: Poor Standing balance comment: stood EOB for a total of 3 minutes; became more off balance as attempting mini-squat with ?clonus RLE; pt closing eyes and wanting to sit down and then had sudden loss of balance posteriorly and assisted to sit on EOB                    Hand Dominance    Extremity/Trunk Assessment Upper Extremity Assessment: RUE deficits/detail;LUE deficits/detail Upper Extremity Assessment Upper Extremity Assessment: RUE deficits/detail;LUE deficits/detail RUE Deficits / Details: reports numbness in all her fingertips; strength 5/5 elbow flexion and extension LUE  Deficits / Details: denies numbness currently, reports it "comes and goes"; strength 5/5 elbow flexion/extension     LUE Deficits / Details: denies numbness currently, reports it "comes and goes"; strength 5/5 elbow flexion/extension Lower Extremity Assessment: RLE deficits/detail;LLE  deficits/detail RLE Deficits / Details: reports numbness on dorsum of Rt foot; grossly 4/5 strength (poor effort)  LLE Deficits / Details: grossly 4/5 (poor effort); denies numbness  Cervical / Trunk Assessment:  (no apparent deficits)    Cognition Arousal/Alertness: Lethargic;Suspect due to medications (had pain medicine prior to session) Behavior During Therapy: Flat affect Overall Cognitive Status: Difficult to assess                        General Comments General comments (skin integrity, edema, etc.): Son present throughout.  Exercises      Assessment/Plan    PT Assessment Patient needs continued PT services  PT Diagnosis Difficulty walking;Acute pain   PT Problem List Decreased strength;Decreased activity tolerance;Decreased balance;Decreased mobility;Decreased knowledge of use of DME;Pain  PT Treatment Interventions DME instruction;Gait training;Stair training;Functional mobility training;Therapeutic activities;Balance training;Neuromuscular re-education;Cognitive remediation;Patient/family education   PT Goals (Current goals can be found in the Care Plan section) Acute Rehab PT Goals PT Goal Formulation: With patient/family Time For Goal Achievement: 01/24/14 Potential to Achieve Goals: Fair   PT Stated Goals    PT Goal formulation PT Goal Formulation: With patient/family  Time for Goal achievement Time For Goal Achievement: 01/24/14  Potential to Achieve Goals Potential to Achieve Goals: Fair  Frequency Min 3X/week   Barriers to discharge        End of Session Equipment Utilized During Treatment: Gait belt Activity Tolerance: Patient limited by lethargy;Patient limited by pain Patient left: in bed;with call bell/phone within reach;with family/visitor present    Functional Assessment Tool Used: clinical judgement Functional Limitation: Mobility: Walking and moving around Mobility: Walking and Moving Around Current Status (M0102): At least 40 percent  but less than 60 percent impaired, limited or restricted Mobility: Walking and Moving Around Goal Status [phone]): At least 1 percent but less than 20 percent impaired, limited or restricted    Time: 1345-1402 PT Time Calculation (min): 17 min Charges:          PT Evaluation $Initial PT Evaluation Tier I: 1 Procedure              PT G Codes:          Functional Assessment Tool Used: clinical judgement           Functional Limitation: Mobility: Walking and moving around    Lindsee Labarre 01/21/2014, 2:31 PM Pager [phone]

## 2014-01-21 NOTE — Progress Notes (Signed)
Inpatient Diabetes Program Recommendations  AACE/ADA: New Consensus Statement on Inpatient Glycemic Control (2013)  Target Ranges:  Prepandial:   less than 140 mg/dL      Peak postprandial:   less than 180 mg/dL (1-2 hours)      Critically ill patients:  140 - 180 mg/dL   Reason for Visit: Hyperglycemia on Prednisone 60 mg/day Metformin therapy at home and Prednisone 60 mg/day for Bell's Palsey.Marland Kitchen  Requesting increase in Lantus to 10 units to start and and increase correction to resistant  tidwc while on Prednisone therapy any dose greater than 10 mg/day.and and the HS scale (as ordered).  Thank you, Rosita Kea, RN, CNS, Diabetes Coordinator [phone])

## 2014-01-22 DIAGNOSIS — R531 Weakness: Secondary | ICD-10-CM | POA: Diagnosis present

## 2014-01-22 DIAGNOSIS — G500 Trigeminal neuralgia: Principal | ICD-10-CM

## 2014-01-22 DIAGNOSIS — R112 Nausea with vomiting, unspecified: Secondary | ICD-10-CM

## 2014-01-22 DIAGNOSIS — R5383 Other fatigue: Secondary | ICD-10-CM

## 2014-01-22 DIAGNOSIS — R29810 Facial weakness: Secondary | ICD-10-CM

## 2014-01-22 DIAGNOSIS — R5381 Other malaise: Secondary | ICD-10-CM

## 2014-01-22 LAB — GLUCOSE, CAPILLARY
Glucose-Capillary: 227 mg/dL — ABNORMAL HIGH (ref 70–99)
Glucose-Capillary: 268 mg/dL — ABNORMAL HIGH (ref 70–99)

## 2014-01-22 MED ORDER — OXYCODONE-ACETAMINOPHEN 5-325 MG PO TABS: 1.0000 | ORAL_TABLET | Freq: Four times a day (QID) | ORAL | Status: DC | PRN

## 2014-01-22 MED ORDER — GLIMEPIRIDE 4 MG PO TABS: 4.0000 mg | ORAL_TABLET | Freq: Every day | ORAL | Status: DC

## 2014-01-22 MED ORDER — GABAPENTIN 300 MG PO CAPS
300.0000 mg | ORAL_CAPSULE | Freq: Two times a day (BID) | ORAL | Status: DC
  Administered 2014-01-22: 300 mg via ORAL
  Filled 2014-01-22 (×2): qty 1

## 2014-01-22 MED ORDER — SULFAMETHOXAZOLE-TRIMETHOPRIM 400-80 MG PO TABS: 1.0000 | ORAL_TABLET | Freq: Two times a day (BID) | ORAL | Status: DC

## 2014-01-22 MED ORDER — ACETAMINOPHEN 325 MG PO TABS: 650.0000 mg | ORAL_TABLET | Freq: Four times a day (QID) | ORAL | Status: DC | PRN

## 2014-01-22 MED ORDER — CARBAMAZEPINE 100 MG PO CHEW: 100.0000 mg | CHEWABLE_TABLET | Freq: Three times a day (TID) | ORAL | Status: DC

## 2014-01-22 NOTE — Progress Notes (Signed)
Physical Therapy Treatment Patient Details Name: Brandi Bryant MRN: [id number] DOB: May 02, 1956 Today's Date: 01/22/2014 Time: 7829-5621 PT Time Calculation (min): 15 min  PT Assessment / Plan / Recommendation  History of Present Illness Adm with left facial pain. Lost ability to speak while in CT scan. Recently diagnosed with Rt Bells Palsy. MRI head negative for acute infarct. (+old Left occipital infarct)   PT Comments   Patient able to ambulate this session however very unsafe and unsteady. Son very involved in her care and educated on how to assist patient at home. Son stated there are six people at the house and someone will always be with patient. Will need to attempt steps next session.   Follow Up Recommendations  Home health PT;Supervision/Assistance - 24 hour     Does the patient have the potential to tolerate intense rehabilitation     Barriers to Discharge        Equipment Recommendations       Recommendations for Other Services    Frequency Min 3X/week   Progress towards PT Goals Progress towards PT goals: Progressing toward goals  Plan Current plan remains appropriate    Precautions / Restrictions Precautions Precautions: Fall   Pertinent Vitals/Pain Did complain of pain but did not rate, RN aware    Mobility  Bed Mobility Overal bed mobility: Needs Assistance Sidelying to sit: Min guard Sit to sidelying: Min guard General bed mobility comments: Patient able to get out of bed with use of rails and increased effort Transfers Overall transfer level: Needs assistance Equipment used: 1 person hand held assist Sit to Stand: Min assist General transfer comment: Min A for balance and safety. Patient unsteady up on feet without holding to something but stated dizziness was subsided.  Ambulation/Gait Ambulation/Gait assistance: Mod assist Ambulation Distance (Feet): 150 Feet Assistive device: 1 person hand held assist Gait Pattern/deviations: Narrow base  of support;Scissoring;Ataxic;Staggering left;Staggering right;Drifts right/left Gait velocity interpretation: <1.8 ft/sec, indicative of risk for recurrent falls General Gait Details: patient swaying and staggering with gait this session as if she was drunk. Son present throughout and educated on how to assist patient. Attempted using RW but added more confusion for patient  Modified Rankin (Stroke Patients Only) Pre-Morbid Rankin Score: No symptoms Modified Rankin: Moderately severe disability    Exercises     PT Diagnosis:    PT Problem List:   PT Treatment Interventions:     PT Goals (current goals can now be found in the care plan section)    Visit Information  Last PT Received On: 01/22/14 Assistance Needed: +1 History of Present Illness: Adm with left facial pain. Lost ability to speak while in CT scan. Recently diagnosed with Rt Bells Palsy. MRI head negative for acute infarct. (+old Left occipital infarct)    Subjective Data      Cognition  Cognition Arousal/Alertness: Awake/alert Behavior During Therapy: Flat affect Overall Cognitive Status: Within Functional Limits for tasks assessed Difficult to assess due to: Non-English speaking;Level of arousal    Balance  Balance Sitting balance-Leahy Scale: Fair Standing balance-Leahy Scale: Poor  End of Session PT - End of Session Equipment Utilized During Treatment: Gait belt Activity Tolerance: Patient tolerated treatment well Patient left: in bed;with call bell/phone within reach Nurse Communication: Mobility status   GP     Jacqualyn Posey 01/22/2014, 12:01 PM 01/22/2014 Jacqualyn Posey PTA [phone] pager [phone] office

## 2014-01-22 NOTE — Progress Notes (Signed)
Discharge instructions reviewed with patient and family. Patient denied pain or headache at thsi time. All questions answered.   Ave Filter, RN

## 2014-01-22 NOTE — Progress Notes (Signed)
Results for MARISAL, SWAREY (MRN [id number]) as of 01/22/2014 11:15  Ref. Range 01/21/2014 06:52 01/21/2014 14:04 01/21/2014 16:45 01/21/2014 21:08 01/22/2014 06:39  Glucose-Capillary Latest Range: 70-99 mg/dL 250 (H) 334 (H) 281 (H) 209 (H) 227 (H)   CBGs running greater than 180 mg/dl.  Recommend increasing Lantus to 10-15 units daily if CBGs continue greater than 180 mg/dl. Continue Novolog correction scale as ordered.  Harvel Ricks RN BSN CDE

## 2014-01-22 NOTE — Progress Notes (Signed)
Subjective: No recurrence of speech abnormality. Patient is still experiencing intermittent pain involving left side of her face. Pain is less severe today than yesterday. Right facial weakness is unchanged, and consistent with Bell's palsy.  Objective: Current vital signs: BP 115/72  Pulse 87  Temp(Src) 98.6 F (37 C) (Oral)  Resp 20  Ht 4\' 9"  (1.448 m)  Wt 66.225 kg (146 lb)  BMI 31.59 kg/m2  SpO2 96%  Neurologic Exam: Alert and in no acute distress currently. Able to communicate through interpreter with no difficulty.  Extraocular movements were normal. No voluntary movement of right upper and lower facial muscles. Normal strength of left upper and lower facial musculature. No pain was triggered with tactile sensation on left side of the face nor with muscle movement. Speech was normal with no significant dysarthria.  Medications: I have reviewed the patient's current medications.  Assessment/Plan: 1. Left trigeminal neuralgia, somewhat improved on current regimen of Neurontin 300 mg at bedtime and Tegretol 100 mg 3 times a day, along with when necessary symptomatic treatment with oxycodone/Roxicodone. 2. Recurrent acute right Bell's palsy.  Recommendations: 1. Increase Neurontin to 300 mg twice a day. 2. Continue analgesic medication as needed for symptomatic treatment of severe facial pain. 3. No further neurodiagnostic studies are indicated, as MRI studies of her brain and cervical spinal cord were unremarkable.  We will continue to follow this patient with you.  C.R. Nicole Kindred, MD [facility] [phone]  01/22/2014  10:38 AM

## 2014-01-22 NOTE — Evaluation (Signed)
Occupational Therapy Evaluation Patient Details Name: Brandi Bryant MRN: [id number] DOB: [birth year] Today's Date: 01/22/2014 Time: 2707-8675 OT Time Calculation (min): 28 min  OT Assessment / Plan / Recommendation History of present illness Adm with left facial pain. Lost ability to speak while in CT scan. Recently diagnosed with Rt Bells Palsy. MRI head negative for acute infarct. (+old Left occipital infarct)   Clinical Impression   Pt presents with below problem list. Pt independent with ADLs, PTA. Education provided to pt/family member during session. Pt about to d/c and OT recommended HHOT upon d/c and spoke with MD about this.     OT Assessment  All further OT needs can be met in the next venue of care    Follow Up Recommendations  Home health OT;Supervision/Assistance - 24 hour    Barriers to Discharge      Equipment Recommendations  None recommended by OT    Recommendations for Other Services    Frequency       Precautions / Restrictions Precautions Precautions: Fall   Pertinent Vitals/Pain Nauseous during session.     ADL  Lower Body Dressing: Minimal assistance;Moderate assistance (Min/Mod A for balance) Where Assessed - Lower Body Dressing: Supported sit to stand Toilet Transfer: Minimal assistance Toilet Transfer Method: Sit to Loss adjuster, chartered: Regular height toilet Tub/Shower Transfer: Minimal assistance Tub/Shower Transfer Method: Therapist, art: Shower seat with back;Other (comment) (practiced stepping over tub) Equipment Used: Gait belt Transfers/Ambulation Related to ADLs: Mod A for ambulation; Min A for transfers. ADL Comments: Recommended sitting for bathing and sitting to get dressed. Son states he can get some type of chair for tub. Practiced tub transfer and safe technique. Recommended family picking up rugs in house and discussed safe shoewear. Pt performed fine motor activities and wrote down some  for her to be doing. Encouraged to be using right hand for activities-educated to avoid hot surfaces and sharp/dangerous objects due to decreased sensation in RUE. Wrote down information of fine motor activities for pt (son agreed to translate it). Educated on signs/symptoms of stroke and importance of getting help right away and recommended avoiding canned foods.  Recommended someone being with pt 24/7.   OT Diagnosis: Other (comment) (decreased balance)  OT Problem List: Decreased strength;Decreased knowledge of use of DME or AE;Decreased knowledge of precautions;Impaired sensation;Impaired balance (sitting and/or standing);Decreased activity tolerance;Decreased coordination;Impaired vision/perception OT Treatment Interventions:     OT Goals(Current goals can be found in the care plan section)    Visit Information  Last OT Received On: 01/22/14 Assistance Needed: +1 History of Present Illness: Adm with left facial pain. Lost ability to speak while in CT scan. Recently diagnosed with Rt Bells Palsy. MRI head negative for acute infarct. (+old Left occipital infarct)       Prior [place] expects to be discharged to:: Private residence Living Arrangements: Other relatives (sibling) Available Help at Discharge: Family;Available 24 hours/day (sister and daughter) Type of Home: House Home Access: Stairs to enter Technical brewer of Steps: 2 Entrance Stairs-Rails: None Home Layout: One level Home Equipment: None Prior Function Level of Independence: Independent Comments: drives; no balance problems prior per son Communication Communication: Prefers language other than English (Spanish; son present and interpreting) Dominant Hand: Right         Vision/Perception Vision - History Patient Visual Report: Other (comment) (seeing white spots in Rt eye-it was patched during session)   Cognition  Cognition Arousal/Alertness:  Awake/alert Behavior During  Therapy: Flat affect Overall Cognitive Status: Difficult to assess Difficult to assess due to: Non-English speaking    Extremity/Trunk Assessment Upper Extremity Assessment Upper Extremity Assessment: RUE deficits/detail RUE Deficits / Details: slightly weaker than LUE; slightly weaker grasp. RUE Sensation: decreased light touch RUE Coordination: decreased fine motor Lower Extremity Assessment Lower Extremity Assessment: Defer to PT evaluation     Mobility Transfers Overall transfer level: Needs assistance Equipment used: 1 person hand held assist Transfers: Sit to/from Stand Sit to Stand: Min assist General transfer comment: Assist for balance. Cues for technique.     Exercise     Balance     End of Session OT - End of Session Equipment Utilized During Treatment: Gait belt Activity Tolerance: Other (comment) (nauseous; dizzy) Patient left: in bed;with family/visitor present Nurse Communication: Other (comment) (recommend HHOT)  GO Functional Assessment Tool Used: clinical judgment Functional Limitation: Self care Self Care Current Status (D5686): At least 20 percent but less than 40 percent impaired, limited or restricted Self Care Goal Status (H6837): At least 20 percent but less than 40 percent impaired, limited or restricted Self Care Discharge Status [phone]): At least 20 percent but less than 40 percent impaired, limited or restricted   Benito Mccreedy OTR/L [phone] 01/22/2014, 5:49 PM

## 2014-01-22 NOTE — Discharge Summary (Signed)
Physician Discharge Summary  [place] IRS:[id number] DOB: [birth year] DOA: 01/20/2014  PCP: Pcp Not In System  Admit date: 01/20/2014 Discharge date: 01/22/2014  Discharge Diagnoses:    Functional Weakness and speech difficulty Active Problems:   Trigeminal neuralgia   HTN (hypertension)   History of breast cancer   Hyperlipidemia   Diabetes type 2, uncontrolled   Bell's palsy   UTI (urinary tract infection)   Hepatic steatosis  Discharge Condition: stable  Filed Weights   01/21/14 0010  Weight: 66.225 kg (146 lb)    History of present illness:  58 y.o. female with history of breast cancer who diabetes mellitus hypertension, CVA was brought in the ER after patient had persistent pain in the left side of the face. Patient was recently diagnosed with Bell's palsy last Thursday 4 days ago and was placed on acyclovir and prednisone and at that time patient had MRI of the brain which was unremarkable presents to the ER after patient was found to have increasing pain on the left side of her face and also occipital area.. During the last visit patient also had CT angiogram of the head which was unremarkable. Since patient had significant pain patient was taken to to get CT head done on patient became aphasic and a code stroke was called neurologist Dr. Aram Beecham had seen the patient and had advised MRI brain done and if negative can be discharged home. But patient was having significant pain in the face area requiring multiple doses of narcotics and patient is admitted for pain management. Patient MRI brain has come negative for anything acute. Patient still complains of some numbness in the right upper extremity and right shoulder area. Denies any chest pain or shortness of breath. Denies any visual symptoms difficulty speaking swallowing. Patient has had pain in the right side of the face since last Thursday but left-sided facial pain started today. Left-sided facial pain is all over the  face area. Improves with IV narcotics.   Hospital Course:  Admitted to hospitalists. Neurology consulted. Had right sided bells palsy PTA. trigenminal neuralgia on the left. MRI neck showed nothing acute. Started on carbamazepine with moderate results. Worked with PT, OT. Stable for discharge  Procedures:  none  Consultations:  neurology  Discharge Exam: Filed Vitals:   01/22/14 1419  BP: 128/79  Pulse: 90  Temp: 99 F (37.2 C)  Resp: 20    General: comfortable Cardiovascular: RRR Respiratory: CTA Neuro: right facial droop. Strength 5/5  Discharge Instructions  Discharge Orders   Future Appointments Provider Department Dept Phone   01/24/2014 11:45 AM Dorothy Spark, MD [place] [facility] [phone]   01/24/2014 2:30 PM [facility] Provider [place] [phone]   04/17/2014 3:15 PM Heath Lark, MD [facility] [phone]   Future Orders Complete By Expires   Diet - low sodium heart healthy  As directed    Diet Carb Modified  As directed    Walk with assistance  As directed        Medication List    STOP taking these medications       acyclovir 200 MG capsule  Commonly known as:  ZOVIRAX     HYDROcodone-acetaminophen 5-325 MG per tablet  Commonly known as:  NORCO/VICODIN     predniSONE 20 MG tablet  Commonly known as:  DELTASONE      TAKE these medications       acetaminophen 325 MG tablet  Commonly  known as:  TYLENOL  Take 2 tablets (650 mg total) by mouth every 6 (six) hours as needed for mild pain (or Fever >/= 101).     aspirin EC 81 MG tablet  Take 81 mg by mouth daily.     atorvastatin 10 MG tablet  Commonly known as:  LIPITOR  Take 10 mg by mouth daily.     buPROPion 150 MG 12 hr tablet  Commonly known as:  WELLBUTRIN SR  Take 150 mg by mouth daily.     calcium-vitamin D 500-200 MG-UNIT per tablet  Commonly known as:  OSCAL WITH D  Take 1 tablet by  mouth daily with breakfast.     carbamazepine 100 MG chewable tablet  Commonly known as:  TEGRETOL  Chew 1 tablet (100 mg total) by mouth 3 (three) times daily.     glimepiride 4 MG tablet  Commonly known as:  AMARYL  Take 1 tablet (4 mg total) by mouth daily with breakfast.     ibuprofen 800 MG tablet  Commonly known as:  ADVIL,MOTRIN  Take 800 mg by mouth every 8 (eight) hours as needed for pain.     letrozole 2.5 MG tablet  Commonly known as:  FEMARA  Take 2.5 mg by mouth daily.     lisinopril-hydrochlorothiazide 20-12.5 MG per tablet  Commonly known as:  PRINZIDE,ZESTORETIC  Take 1 tablet by mouth daily.     meclizine 25 MG tablet  Commonly known as:  ANTIVERT  Take 1 tablet (25 mg total) by mouth 2 (two) times daily as needed for dizziness.     metFORMIN 850 MG tablet  Commonly known as:  GLUCOPHAGE  Take 425-850 mg by mouth 2 (two) times daily with a meal. Takes 850mg  in the morning and 425mg  in the evening     multivitamin with minerals tablet  Take 1 tablet by mouth daily.     ondansetron 4 MG tablet  Commonly known as:  ZOFRAN  Take 1 tablet (4 mg total) by mouth every 6 (six) hours.     oxyCODONE-acetaminophen 5-325 MG per tablet  Commonly known as:  ROXICET  Take 1-2 tablets by mouth every 6 (six) hours as needed for severe pain.     sulfamethoxazole-trimethoprim 400-80 MG per tablet  Commonly known as:  BACTRIM,SEPTRA  Take 1 tablet by mouth every 12 (twelve) hours.       Allergies  Allergen Reactions  . Gadolinium Derivatives     Code: VOM, Desc: Pt began vomiting 45 sec after MRI contrast injection of Multihance, Onset Date: [id number]        The results of significant diagnostics from this hospitalization (including imaging, microbiology, ancillary and laboratory) are listed below for reference.    Significant Diagnostic Studies: Ct Angio Head W/cm &/or Wo Cm  01/15/2014   CLINICAL DATA:  Right-sided pain with right-sided facial droop.  EXAM:  CT ANGIOGRAPHY HEAD  TECHNIQUE: Multidetector CT imaging of the head was performed using the standard protocol during bolus administration of intravenous contrast. Multiplanar CT image reconstructions and MIPs were obtained to evaluate the vascular anatomy.  CONTRAST:  53mL OMNIPAQUE IOHEXOL 350 MG/ML SOLN  COMPARISON:  None.  FINDINGS: Remote left occipital lobe infarct again noted, unchanged. Left choroidal fissure cyst again noted, also unchanged.  The visualized distal portions of the internal carotid arteries are well opacified bilaterally without evidence of dissection or other abnormality. The petrous segments are within normal limits. Scattered calcified atherosclerotic plaque present within the cavernous portions of  the internal carotid arteries bilaterally, right greater than left, without high-grade stenosis. Supra clinoid segments are within normal limits. The A1 segments are well opacified bilaterally. Anterior communicating artery and anterior cerebral arteries are within normal limits. The middle cerebral arteries are well opacified bilaterally without evidence of high-grade flow-limiting stenosis. No proximal branch occlusion. Distal MCA artery branches are well opacified bilaterally. No aneurysm identified within the anterior circulation.  The distal vertebral arteries are well opacified bilaterally with widely patent antegrade flow. Posterior inferior cerebellar arteries are within normal limits. The vertebrobasilar junction and basilar artery are unremarkable. Posterior cerebral arteries, superior cerebellar arteries, and anterior inferior cerebellar arteries are normal bilaterally. No aneurysm identified within the posterior circulation.  Review of the MIP images confirms the above findings.  Air-fluid level noted within the right maxillary sinus.  IMPRESSION: 1. Normal CTA of the brain without evidence of dissection, high-grade flow-limiting stenosis, or proximal branch occlusion. 2. No  intracranial aneurysm. 3. Mild calcified atheromatous disease within the cavernous segments of the internal carotid arteries bilaterally, right greater than left. 4. Remote left occipital lobe infarct, unchanged. 5. Right maxillary sinus disease.   Electronically Signed   By: Jeannine Boga M.D.   On: 01/15/2014 23:58   Ct Head Wo Contrast  01/20/2014   CLINICAL DATA:  Headaches  EXAM: CT HEAD WITHOUT CONTRAST  TECHNIQUE: Contiguous axial images were obtained from the base of the skull through the vertex without intravenous contrast.  COMPARISON:  CT ANGIO HEAD W/CM &/OR WO/CM dated 01/15/2014; CT HEAD W/O CM dated 01/15/2014; MR HEAD W/O CM dated 01/15/2014  FINDINGS: There is no evidence of mass effect, midline shift or extra-axial fluid collections. There is no evidence of a space-occupying lesion or intracranial hemorrhage. There is no evidence of a cortical-based area of acute infarction. 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 There is periventricular white matter low attenuation likely secondary to microangiopathy.  The ventricles and sulci are appropriate for the patient's age. The basal cisterns are patent.  Visualized portions of the orbits are unremarkable. Small right maxillary sinus air-fluid level and bilateral ethmoid sinus mucosal thickening.  The osseous structures are unremarkable.  IMPRESSION: 1. No acute intracranial pathology. 2. Sinus disease as described above.   Electronically Signed   By: Kathreen Devoid   On: 01/20/2014 17:02   Ct Head (brain) Wo Contrast  01/15/2014   CLINICAL DATA:  Severe headaches  EXAM: CT HEAD WITHOUT CONTRAST  TECHNIQUE: Contiguous axial images were obtained from the base of the skull through the vertex without intravenous contrast.  COMPARISON:  June 22, 2006  FINDINGS: There is chronic diffuse atrophy. There is mild bilateral periventricular white matter small vessel ischemic change. There is low density in the left occipital lobe with loss of gray white  differentiation suspicious for acute to subacute infarction. There is no midline shift or hydrocephalus. There is no acute hemorrhage. There is an old left basal ganglia lacune are infarction without change. The bony calvarium is intact. There is minimal mucoperiosteal thickening of the right ethmoid sinus.  IMPRESSION: There is low density in the left occipital lobe with loss of gray white differentiation suspicious for acute to subacute infarction.   Electronically Signed   By: Abelardo Diesel M.D.   On: 01/15/2014 18:29   Mr Brain Wo Contrast  01/20/2014   CLINICAL DATA:  Headache and left-sided facial droop.  Code stroke.  EXAM: MRI HEAD WITHOUT CONTRAST  TECHNIQUE: Multiplanar, multiecho pulse sequences of the brain and surrounding structures  were obtained without intravenous contrast.  COMPARISON:  CT HEAD W/O CM dated 01/20/2014; MR HEAD W/O CM dated 01/15/2014  FINDINGS: There is no evidence of acute infarct. Again seen is a remote left occipital lobe infarct with small amount of associated old blood products. Dilated perivascular space in the left basal ganglia is unchanged. Scattered, small foci of T2 hyperintensity within the subcortical and deep cerebral white matter do not appear significantly changed and are compatible with mild chronic small vessel ischemic disease. Mild age-related cerebral atrophy is present. There is no evidence of mass, midline shaft, or extra-axial fluid collection.  Orbits are unremarkable. Small amount of fluid remains in the right maxillary sinus. Mild right ethmoid air cell mucosal thickening is noted. Mastoid air cells are clear. Major intracranial vascular flow voids are preserved.  IMPRESSION: 1. No evidence of acute intracranial abnormality. 2. Unchanged, remote left occipital lobe infarct and mild chronic small vessel ischemic disease.   Electronically Signed   By: Sebastian Ache   On: 01/20/2014 21:27   Mr Brain Wo Contrast  01/15/2014   CLINICAL DATA:  Gradual onset of  right-sided numbness, right facial droop, and right-sided weakness. The patient noted pain prior to the onset of these symptoms.  EXAM: MRI HEAD WITHOUT CONTRAST  TECHNIQUE: Multiplanar, multiecho pulse sequences of the brain and surrounding structures were obtained without intravenous contrast.  COMPARISON:  CT head without contrast 01/15/2014 and MRI brain 07/08/2006.  FINDINGS: The diffusion-weighted images demonstrate no evidence for acute or subacute infarction. The left occipital lobe infarct is new since 2007, but is not acute. There are some remote blood products associated with the infarct.  No acute hemorrhage or mass lesion is present. There is slight progression and periventricular and subcortical white matter disease since the prior exam. The ventricles are of normal size. No significant extra-axial fluid collection is present.  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 Flow is present in the major intracranial arteries. A fluid level is present in the right maxillary sinus. There is scattered opacification of right-sided ethmoid air cells. The frontal sinuses are clear. The sphenoid sinuses are clear. The mastoid air cells are clear. The globes and orbits are intact.  IMPRESSION: 1. No acute intracranial abnormality. 2. Left occipital lobe infarct is new since 2007 but is not acute. There are some remote punctate blood products associated with this infarct. 3. Slight progression in periventricular and subcortical white matter disease, likely reflecting progressive microvascular ischemia.   Electronically Signed   By: Gennette Pac M.D.   On: 01/15/2014 19:57   Mr Cervical Spine Wo Contrast  01/21/2014   CLINICAL DATA:  Neck pain and right hand numbness.  EXAM: MRI CERVICAL SPINE WITHOUT CONTRAST  TECHNIQUE: Multiplanar, multisequence MR imaging was performed. No intravenous contrast was administered.  COMPARISON:  Cervical spine radiographs 08/27/2007  FINDINGS: Images are mildly to  moderately degraded by motion, particularly axial images as well as sagittal STIR images.  There is mild straightening of the normal cervical lordosis. There is no listhesis. Vertebral bone marrow signal is within normal limits. Craniocervical junction is unremarkable. The cervical spinal cord is normal in caliber without signal abnormality identified.  There is likely a small left central disc extrusion at C3-4 with inferior migration but without evidence of gross stenosis. Mild left uncovertebral hypertrophy is present at C5-6 without stenosis. Visualized paraspinal soft tissues are grossly unremarkable.  IMPRESSION: Motion degraded examination as above. Mild degenerative disc disease without stenosis identified.  Electronically Signed   By: Logan Bores   On: 01/21/2014 13:13   Dg Chest Port 1 View  01/20/2014   CLINICAL DATA:  Facial droop with headaches. History of hypertension and diabetes.  EXAM: PORTABLE CHEST - 1 VIEW  COMPARISON:  CT CHEST W/O CM dated 10/10/2013; DG CHEST 2 VIEW dated 09/30/2013  FINDINGS: 1744 hr. The heart size and mediastinal contours are normal. The lungs are clear. There is no pleural effusion or pneumothorax. No acute osseous findings are identified. Telemetry leads overlie the chest.  IMPRESSION: Stable examination.  No active cardiopulmonary process.   Electronically Signed   By: Camie Patience M.D.   On: 01/20/2014 18:08    Microbiology: No results found for this or any previous visit (from the past 240 hour(s)).   Labs: Basic Metabolic Panel:  Recent Labs Lab 01/15/14 1757 01/15/14 1820 01/20/14 1621 01/20/14 2224 01/21/14 0445  NA 141 140 135* 143 141  K 3.6* 3.3* 4.6 4.1 4.3  CL 100 104 96 101 102  CO2 22  --  22  --  26  GLUCOSE 225* 234* 426* 300* 275*  BUN 13 12 18 16 15   CREATININE 0.73 0.70 0.67 0.80 0.59  CALCIUM 9.3  --  9.3  --  8.8   Liver Function Tests:  Recent Labs Lab 01/15/14 1757 01/20/14 1621 01/21/14 0445  AST 116* 72* 44*   ALT 62* 54* 41*  ALKPHOS 117 117 89  BILITOT 0.5 0.7 0.7  PROT 7.9 8.0 6.8  ALBUMIN 3.9 3.7 3.2*   No results found for this basename: LIPASE, AMYLASE,  in the last 168 hours No results found for this basename: AMMONIA,  in the last 168 hours CBC:  Recent Labs Lab 01/15/14 1757 01/15/14 1820 01/20/14 1621 01/20/14 2224 01/21/14 0445  WBC 6.5  --  6.8  --  6.2  NEUTROABS 3.6  --  6.0  --  4.9  HGB 13.7 13.9 14.9 14.3 13.3  HCT 38.4 41.0 41.1 42.0 38.2  MCV 86.1  --  86.3  --  87.2  PLT 164  --  177  --  170   Cardiac Enzymes:  Recent Labs Lab 01/20/14 1621  TROPONINI <0.30   BNP: BNP (last 3 results) No results found for this basename: PROBNP,  in the last 8760 hours CBG:  Recent Labs Lab 01/21/14 1404 01/21/14 1645 01/21/14 2108 01/22/14 0639 01/22/14 1110  GLUCAP 334* 281* 209* 227* 268*       Signed:  Dona Ana L  [facility] 01/22/2014, 3:37 PM

## 2014-01-23 LAB — URINE CULTURE: Colony Count: >=100000

## 2014-01-24 ENCOUNTER — Ambulatory Visit: Payer: No Typology Code available for payment source | Attending: Internal Medicine | Admitting: Internal Medicine

## 2014-01-24 ENCOUNTER — Ambulatory Visit: Payer: No Typology Code available for payment source | Admitting: Cardiology

## 2014-01-24 ENCOUNTER — Encounter: Payer: Self-pay | Admitting: Internal Medicine

## 2014-01-24 VITALS — BP 143/90 | HR 96 | Temp 98.4°F | Resp 14 | Ht 63.0 in | Wt 144.6 lb

## 2014-01-24 DIAGNOSIS — E119 Type 2 diabetes mellitus without complications: Secondary | ICD-10-CM

## 2014-01-24 LAB — CBC WITH DIFFERENTIAL/PLATELET
BASOS PCT: 0 % (ref 0–1)
Basophils Absolute: 0 10*3/uL (ref 0.0–0.1)
Eosinophils Absolute: 0.2 10*3/uL (ref 0.0–0.7)
Eosinophils Relative: 2 % (ref 0–5)
HCT: 46.8 % — ABNORMAL HIGH (ref 36.0–46.0)
Hemoglobin: 15.9 g/dL — ABNORMAL HIGH (ref 12.0–15.0)
LYMPHS PCT: 35 % (ref 12–46)
Lymphs Abs: 3.7 10*3/uL (ref 0.7–4.0)
MCH: 30.1 pg (ref 26.0–34.0)
MCHC: 34 g/dL (ref 30.0–36.0)
MCV: 88.5 fL (ref 78.0–100.0)
Monocytes Absolute: 0.9 10*3/uL (ref 0.1–1.0)
Monocytes Relative: 9 % (ref 3–12)
Neutro Abs: 5.7 10*3/uL (ref 1.7–7.7)
Neutrophils Relative %: 54 % (ref 43–77)
PLATELETS: 228 10*3/uL (ref 150–400)
RBC: 5.29 MIL/uL — ABNORMAL HIGH (ref 3.87–5.11)
RDW: 13.9 % (ref 11.5–15.5)
WBC: 10.5 10*3/uL (ref 4.0–10.5)

## 2014-01-24 LAB — COMPLETE METABOLIC PANEL WITH GFR
ALT: 49 U/L — ABNORMAL HIGH (ref 0–35)
AST: 58 U/L — AB (ref 0–37)
Albumin: 4 g/dL (ref 3.5–5.2)
Alkaline Phosphatase: 127 U/L — ABNORMAL HIGH (ref 39–117)
BILIRUBIN TOTAL: 0.6 mg/dL (ref 0.2–1.2)
BUN: 15 mg/dL (ref 6–23)
CO2: 21 mEq/L (ref 19–32)
CREATININE: 0.94 mg/dL (ref 0.50–1.10)
Calcium: 9.8 mg/dL (ref 8.4–10.5)
Chloride: 98 mEq/L (ref 96–112)
GFR, EST NON AFRICAN AMERICAN: 68 mL/min
GFR, Est African American: 78 mL/min
Glucose, Bld: 216 mg/dL — ABNORMAL HIGH (ref 70–99)
Potassium: 4 mEq/L (ref 3.5–5.3)
Sodium: 134 mEq/L — ABNORMAL LOW (ref 135–145)
Total Protein: 6.8 g/dL (ref 6.0–8.3)

## 2014-01-24 LAB — GLUCOSE, POCT (MANUAL RESULT ENTRY): POC Glucose: 250 mg/dl — AB (ref 70–99)

## 2014-01-24 MED ORDER — GLIMEPIRIDE 4 MG PO TABS: 4.0000 mg | ORAL_TABLET | Freq: Two times a day (BID) | ORAL | Status: DC

## 2014-01-24 NOTE — Progress Notes (Signed)
Patient ID: Brandi Bryant, female   DOB: 04/09/1956, 58 y.o.   MRN: [id number]   CC:  HPI: Brandi Bryant is a 58 y.o. female with history of breast cancer who diabetes mellitus hypertension, CVA was brought in the ER after patient had persistent pain in the left side of the face. Patient was recently diagnosed with Bell's palsy and was placed on acyclovir and prednisone and at that time patient had MRI of the brain which was unremarkable presents to the ER after patient was found to have increasing pain on the left side of her face and also occipital area.. During the last visit patient also had CT angiogram of the head which was unremarkable. Since patient had significant pain patient was taken to to get CT head done on patient became aphasic and a code stroke was called neurologist Dr. Aram Beecham had seen the patient and had advised MRI brain done  . But patient was having significant pain in the face area requiring multiple doses of narcotics and patient was admitted for pain management. Repeat MRI was negative.  Continues to complain of severe pain on the left side of her face. She is taking Tegretol 100 mg 3 times a day for left trigeminal neuralgia. She is also on Neurontin and taking Percocet for pain. She complains of nystagmus and blurry vision. She is currently stopped taking her prednisone further Bell's palsy but the CBC still elevated at 255 almost recent A1c was 9.6. She also complains of difficulty swallowing and controlling her secretions.   Allergies  Allergen Reactions  . Gadolinium Derivatives     Code: VOM, Desc: Pt began vomiting 45 sec after MRI contrast injection of Multihance, Onset Date: [id number]     Past Medical History  Diagnosis Date  . Breast cancer   . HTN (hypertension)   . Diabetes mellitus   . Cholelithiasis   . Hepatic steatosis   . Abdominal pain   . History of breast cancer 06/01/2012  . Arthralgia 08/13/2013  . Chest pain 09/30/2013  .  Stroke 2006   Current Outpatient Prescriptions on File Prior to Visit  Medication Sig Dispense Refill  . acetaminophen (TYLENOL) 325 MG tablet Take 2 tablets (650 mg total) by mouth every 6 (six) hours as needed for mild pain (or Fever >/= 101).      Marland Kitchen aspirin EC 81 MG tablet Take 81 mg by mouth daily.      Marland Kitchen atorvastatin (LIPITOR) 10 MG tablet Take 10 mg by mouth daily.      . calcium-vitamin D (OSCAL WITH D) 500-200 MG-UNIT per tablet Take 1 tablet by mouth daily with breakfast.      . carbamazepine (TEGRETOL) 100 MG chewable tablet Chew 1 tablet (100 mg total) by mouth 3 (three) times daily.  90 tablet  0  . ibuprofen (ADVIL,MOTRIN) 800 MG tablet Take 800 mg by mouth every 8 (eight) hours as needed for pain.      Marland Kitchen letrozole (FEMARA) 2.5 MG tablet Take 2.5 mg by mouth daily.      Marland Kitchen lisinopril-hydrochlorothiazide (PRINZIDE,ZESTORETIC) 20-12.5 MG per tablet Take 1 tablet by mouth daily.      . meclizine (ANTIVERT) 25 MG tablet Take 1 tablet (25 mg total) by mouth 2 (two) times daily as needed for dizziness.  30 tablet  0  . metFORMIN (GLUCOPHAGE) 850 MG tablet Take 425-850 mg by mouth 2 (two) times daily with a meal. Takes 850mg  in the morning and 425mg  in the evening      .  Multiple Vitamins-Minerals (MULTIVITAMIN WITH MINERALS) tablet Take 1 tablet by mouth daily.      Marland Kitchen oxyCODONE-acetaminophen (ROXICET) 5-325 MG per tablet Take 1-2 tablets by mouth every 6 (six) hours as needed for severe pain.  30 tablet  0  . buPROPion (WELLBUTRIN SR) 150 MG 12 hr tablet Take 150 mg by mouth daily.      . ondansetron (ZOFRAN) 4 MG tablet Take 1 tablet (4 mg total) by mouth every 6 (six) hours.  12 tablet  0  . sulfamethoxazole-trimethoprim (BACTRIM,SEPTRA) 400-80 MG per tablet Take 1 tablet by mouth every 12 (twelve) hours.  4 tablet  0  . [DISCONTINUED] escitalopram (LEXAPRO) 10 MG tablet Take 10 mg by mouth daily.       No current facility-administered medications on file prior to visit.   Family  History  Problem Relation Age of Onset  . Thyroid cancer Sister     thyroid   History   Social History  . Marital Status: Single    Spouse Name: N/A    Number of Children: N/A  . Years of Education: N/A   Occupational History  . Not on file.   Social History Main Topics  . Smoking status: Never Smoker   . Smokeless tobacco: Not on file  . Alcohol Use: No  . Drug Use: No  . Sexual Activity: Not Currently    Birth Control/ Protection: Surgical   Other Topics Concern  . Not on file   Social History Narrative  . No narrative on file    Review of Systems  Constitutional: Negative for fever, chills, diaphoresis, activity change, appetite change and fatigue.  HENT: Negative for ear pain, nosebleeds, congestion, facial swelling, rhinorrhea, neck pain, neck stiffness and ear discharge.   Eyes: Negative for pain, discharge, redness, itching and visual disturbance.  Respiratory: Negative for cough, choking, chest tightness, shortness of breath, wheezing and stridor.   Cardiovascular: Negative for chest pain, palpitations and leg swelling.  Gastrointestinal: Negative for abdominal distention.  Genitourinary: Negative for dysuria, urgency, frequency, hematuria, flank pain, decreased urine volume, difficulty urinating and dyspareunia.  Musculoskeletal: Negative for back pain, joint swelling, arthralgias and gait problem.  Neurological: Negative for dizziness, tremors, seizures, syncope, facial asymmetry, speech difficulty, weakness, light-headedness, numbness and headaches.  Hematological: Negative for adenopathy. Does not bruise/bleed easily.  Psychiatric/Behavioral: Negative for hallucinations, behavioral problems, confusion, dysphoric mood, decreased concentration and agitation.    Objective:   Filed Vitals:   01/24/14 1433  BP: 143/90  Pulse: 96  Temp: 98.4 F (36.9 C)  Resp: 14    Physical Exam  Constitutional: Appears well-developed and well-nourished. No distress.   HENT: Normocephalic. External right and left ear normal. Oropharynx is clear and moist.  Eyes: Conjunctivae and EOM are normal. PERRLA, no scleral icterus.  Neck: Normal ROM. Neck supple. No JVD. No tracheal deviation. No thyromegaly.  CVS: RRR, S1/S2 +, no murmurs, no gallops, no carotid bruit.  Pulmonary: Effort and breath sounds normal, no stridor, rhonchi, wheezes, rales.  Abdominal: Soft. BS +,  no distension, tenderness, rebound or guarding.  Musculoskeletal: Normal range of motion. No edema and no tenderness.  Lymphadenopathy: No lymphadenopathy noted, cervical, inguinal. Neuro: Alert. Normal reflexes, muscle tone coordination. No cranial nerve deficit. Skin: Skin is warm and dry. No rash noted. Not diaphoretic. No erythema. No pallor.  Psychiatric: Normal mood and affect. Behavior, judgment, thought content normal.   Lab Results  Component Value Date   WBC 6.2 01/21/2014   HGB 13.3 01/21/2014  HCT 38.2 01/21/2014   MCV 87.2 01/21/2014   PLT 170 01/21/2014   Lab Results  Component Value Date   CREATININE 0.59 01/21/2014   BUN 15 01/21/2014   NA 141 01/21/2014   K 4.3 01/21/2014   CL 102 01/21/2014   CO2 26 01/21/2014    Lab Results  Component Value Date   HGBA1C 9.0* 01/21/2014   Lipid Panel     Component Value Date/Time   CHOL 158 10/28/2013 1140   TRIG 165.0* 10/28/2013 1140   HDL 39.20 10/28/2013 1140   CHOLHDL 4 10/28/2013 1140   VLDL 33.0 10/28/2013 1140   LDLCALC 86 10/28/2013 1140       Assessment and plan:   Patient Active Problem List   Diagnosis Date Noted  . Trigeminal neuralgia 01/22/2014  . Weakness 01/22/2014  . Bell's palsy 01/21/2014  . UTI (urinary tract infection) 01/21/2014  . Hepatic steatosis 01/21/2014  . Headache 01/20/2014  . Diabetes type 2, uncontrolled 01/20/2014  . Facial droop 01/15/2014  . Stroke 01/15/2014  . Uncontrolled diabetes mellitus 12/06/2013  . Hyperlipidemia 11/07/2013  . CAD (coronary artery disease), native coronary artery 11/07/2013   . Chest pain 09/30/2013  . Arthralgia 08/13/2013  . History of breast cancer 06/01/2012  . Abdominal pain 01/31/2012  . Nausea & vomiting 01/31/2012  . HTN (hypertension)   . Diabetes mellitus    Severe facial pain/trigeminal neuralgia/Bell's palsy According to neurology no further neurodiagnostic studies indicated She had multiple MRIs of the brain and C-spine Continue Neurontin and Tegretol Followup with neurology will schedule appointment Speech therapy referral for difficulty swallowing Eyepatch for nystagmus  Diabetes mellitus Uncontrolled    Increase Amaryl to 4 mg twice a day   Establish care History of breast cancer, will schedule patient for mammogram She has a follow up with oncology this month Schedule the patient for routine Pap smear     The patient was given clear instructions to go to ER or return to medical center if symptoms don't improve, worsen or new problems develop. The patient verbalized understanding. The patient was told to call to get any lab results if not heard anything in the next week.

## 2014-01-24 NOTE — Progress Notes (Signed)
Patient is here to establish care form a hospital follow up. Patient is a diabetic and suffers from hypertension. Patient states that she has taken her hypertension medication today; BP today of 143/90, Pulse of 96. Patient complains of a pain in Rt side of her face, on and off x2 weeks. Pain occurs around patient's eye. Taking Tylenol OTC for pain; does not help. Patient's eye looks swollen and sluggish. Pain scale of 6 today; spreads through bilateral eyes, all over the face. Patient has an interpreter. Requests a check up.

## 2014-01-25 LAB — SEDIMENTATION RATE: Sed Rate: 4 mm/hr (ref 0–22)

## 2014-01-27 ENCOUNTER — Ambulatory Visit: Payer: No Typology Code available for payment source | Admitting: Cardiology

## 2014-01-27 ENCOUNTER — Telehealth: Payer: Self-pay | Admitting: Emergency Medicine

## 2014-01-27 NOTE — Telephone Encounter (Signed)
Both number listed not set up for voicemail

## 2014-01-27 NOTE — Telephone Encounter (Signed)
Message copied by Ricci Barker on Mon Jan 27, 2014  4:44 PM ------      Message from: Allyson Sabal MD, Ascencion Dike      Created: Sat Jan 25, 2014 12:13 PM       Patient and the labs are normal except patient's CBG. Medication was adjusted during her last visit ------

## 2014-01-28 ENCOUNTER — Emergency Department (HOSPITAL_COMMUNITY)
Admission: EM | Admit: 2014-01-28 | Discharge: 2014-01-28 | Disposition: A | Discharge status: Home / Self Care | Payer: No Typology Code available for payment source | Attending: Emergency Medicine | Admitting: Emergency Medicine

## 2014-01-28 ENCOUNTER — Encounter (HOSPITAL_COMMUNITY): Payer: Self-pay | Admitting: Emergency Medicine

## 2014-01-28 DIAGNOSIS — Z79899 Other long term (current) drug therapy: Secondary | ICD-10-CM | POA: Insufficient documentation

## 2014-01-28 DIAGNOSIS — Z7982 Long term (current) use of aspirin: Secondary | ICD-10-CM | POA: Insufficient documentation

## 2014-01-28 DIAGNOSIS — E119 Type 2 diabetes mellitus without complications: Secondary | ICD-10-CM | POA: Insufficient documentation

## 2014-01-28 DIAGNOSIS — Z8673 Personal history of transient ischemic attack (TIA), and cerebral infarction without residual deficits: Secondary | ICD-10-CM | POA: Insufficient documentation

## 2014-01-28 DIAGNOSIS — Z853 Personal history of malignant neoplasm of breast: Secondary | ICD-10-CM | POA: Insufficient documentation

## 2014-01-28 DIAGNOSIS — G500 Trigeminal neuralgia: Secondary | ICD-10-CM | POA: Insufficient documentation

## 2014-01-28 DIAGNOSIS — Z8719 Personal history of other diseases of the digestive system: Secondary | ICD-10-CM | POA: Insufficient documentation

## 2014-01-28 DIAGNOSIS — I10 Essential (primary) hypertension: Secondary | ICD-10-CM | POA: Insufficient documentation

## 2014-01-28 LAB — CBC WITH DIFFERENTIAL/PLATELET
Basophils Absolute: 0 10*3/uL (ref 0.0–0.1)
Basophils Relative: 0 % (ref 0–1)
Eosinophils Absolute: 0.2 10*3/uL (ref 0.0–0.7)
Eosinophils Relative: 1 % (ref 0–5)
HEMATOCRIT: 43.6 % (ref 36.0–46.0)
Hemoglobin: 15.7 g/dL — ABNORMAL HIGH (ref 12.0–15.0)
LYMPHS ABS: 5.2 10*3/uL — AB (ref 0.7–4.0)
Lymphocytes Relative: 37 % (ref 12–46)
MCH: 31 pg (ref 26.0–34.0)
MCHC: 36 g/dL (ref 30.0–36.0)
MCV: 86 fL (ref 78.0–100.0)
MONO ABS: 1 10*3/uL (ref 0.1–1.0)
Monocytes Relative: 7 % (ref 3–12)
Neutro Abs: 7.6 10*3/uL (ref 1.7–7.7)
Neutrophils Relative %: 55 % (ref 43–77)
PLATELETS: 205 10*3/uL (ref 150–400)
RBC: 5.07 MIL/uL (ref 3.87–5.11)
RDW: 13.6 % (ref 11.5–15.5)
WBC: 14 10*3/uL — AB (ref 4.0–10.5)

## 2014-01-28 LAB — COMPREHENSIVE METABOLIC PANEL
ALT: 50 U/L — AB (ref 0–35)
AST: 53 U/L — AB (ref 0–37)
Albumin: 3.4 g/dL — ABNORMAL LOW (ref 3.5–5.2)
Alkaline Phosphatase: 134 U/L — ABNORMAL HIGH (ref 39–117)
BILIRUBIN TOTAL: 0.4 mg/dL (ref 0.3–1.2)
BUN: 29 mg/dL — ABNORMAL HIGH (ref 6–23)
CALCIUM: 9.7 mg/dL (ref 8.4–10.5)
CO2: 21 meq/L (ref 19–32)
Chloride: 96 mEq/L (ref 96–112)
Creatinine, Ser: 0.77 mg/dL (ref 0.50–1.10)
GFR calc Af Amer: >90 mL/min (ref >90)
GLUCOSE: 238 mg/dL — AB (ref 70–99)
Potassium: 4.7 mEq/L (ref 3.7–5.3)
SODIUM: 139 meq/L (ref 137–147)
Total Protein: 7 g/dL (ref 6.0–8.3)

## 2014-01-28 LAB — CBG MONITORING, ED: Glucose-Capillary: 236 mg/dL — ABNORMAL HIGH (ref 70–99)

## 2014-01-28 MED ORDER — ONDANSETRON 4 MG PO TBDP
8.0000 mg | ORAL_TABLET | Freq: Once | ORAL | Status: AC
  Administered 2014-01-28: 8 mg via ORAL
  Filled 2014-01-28: qty 2

## 2014-01-28 MED ORDER — ONDANSETRON HCL 8 MG PO TABS: 8.0000 mg | ORAL_TABLET | Freq: Three times a day (TID) | ORAL | Status: DC | PRN

## 2014-01-28 MED ORDER — HYDROMORPHONE HCL PF 1 MG/ML IJ SOLN
2.0000 mg | Freq: Once | INTRAMUSCULAR | Status: AC
  Administered 2014-01-28: 2 mg via INTRAMUSCULAR
  Filled 2014-01-28: qty 2

## 2014-01-28 NOTE — ED Notes (Signed)
Pt sat up started vomiting, husband at bedside requested prescription for nausea medication. EDP notified.

## 2014-01-28 NOTE — Discharge Instructions (Signed)
Neuralgia del trigmino (Trigeminal Neuralgia) La neuralgia del trigmino es un trastorno nervioso que causa ataques sbitos de intenso dolor facial. La causa es un trastorno en el nervio trigmino, uno de los nervios principales de la cara. Es ms frecuente en las mujeres y en los [place], aunque tambin puede ocurrir en pacientes ms jvenes. Los ataques pueden durar desde algunos segundos hasta varios minutos, y pueden producirse una o dos veces por ao hasta varias veces por da. La neuralgia del trigmino puede ser un trastorno muy angustiante y discapacitante. Podr ser [place] ciruga en los casos muy graves, si el tratamiento mdico no proporciona alivio. INSTRUCCIONES PARA EL CUIDADO DOMICILIARIO  Si su mdico le prescribe medicamentos para prevenir los ataques, tmelos como se le indica.  Para prevenir los ataques:  Mastique con el lado de la boca que no est afectado.  Evite tocarse el rostro.  Evite las rfagas de aire fro o caliente.  Los hombres pueden dejarse crecer la barba para Risk analyst. SOLICITE ATENCIN MDICA DE INMEDIATO SI:  El dolor es insoportable y los medicamentos no lo [place].  Le aparecen nuevos e inexplicables sntomas (problemas).  Tiene algn problema que pueda relacionarse con los medicamentos que est tomando. Document Released: 08/17/2005 Document Revised: 01/30/2012 [facility] Patient Information 2014 [place], [place].

## 2014-01-28 NOTE — ED Notes (Signed)
Patient presents crying with pain behind her right ear.  Has been recently dx with bells palsey

## 2014-01-28 NOTE — ED Notes (Signed)
CBG taken = 236

## 2014-01-29 NOTE — ED Provider Notes (Signed)
CSN: [id number]     Arrival date & time 01/28/14  0211 History   First MD Initiated Contact with Patient 01/28/14 [phone]     Chief Complaint  Patient presents with  . Pain behind right ear      (Consider location/radiation/quality/duration/timing/severity/associated sxs/prior Treatment) HPI Patient recently admitted for right-sided headache with facial droop. She was diagnosed with trigeminal neuralgia. Patient was discharged home with oxycodone and neurology followup. She presents this evening with her son for increased right-sided posterior auricular pain that radiates to her right face. The pain is sharp and burning. She has persistent right facial droop. She has no new weakness or numbness. She has not been using her oxycodone as prescribed. Chest no fevers or chills. She has no neck pain or stiffness. Patient speaks Spanish and her son is at bedside translating. Past Medical History  Diagnosis Date  . Breast cancer   . HTN (hypertension)   . Diabetes mellitus   . Cholelithiasis   . Hepatic steatosis   . Abdominal pain   . History of breast cancer 06/01/2012  . Arthralgia 08/13/2013  . Chest pain 09/30/2013  . Stroke 2006   Past Surgical History  Procedure Laterality Date  . Tubal ligation    . Breast lumpectomy  2010   Family History  Problem Relation Age of Onset  . Thyroid cancer Sister     thyroid   History  Substance Use Topics  . Smoking status: Never Smoker   . Smokeless tobacco: Not on file  . Alcohol Use: No   OB History   Grav Para Term Preterm Abortions TAB SAB Ect Mult Living   6 6 6       6      Review of Systems  Constitutional: Negative for fever and chills.  HENT: Negative for ear pain, facial swelling, sinus pressure and sore throat.   Respiratory: Negative for shortness of breath.   Cardiovascular: Negative for chest pain.  Gastrointestinal: Negative for nausea, vomiting, abdominal pain, diarrhea and constipation.  Musculoskeletal: Negative for  back pain, neck pain and neck stiffness.  Skin: Negative for pallor, rash and wound.  Neurological: Positive for light-headedness and headaches. Negative for syncope, weakness and numbness.  All other systems reviewed and are negative.      Allergies  Gadolinium derivatives  Home Medications   Current Outpatient Rx  Name  Route  Sig  Dispense  Refill  . acetaminophen (TYLENOL) 325 MG tablet   Oral   Take 2 tablets (650 mg total) by mouth every 6 (six) hours as needed for mild pain (or Fever >/= 101).         Marland Kitchen aspirin EC 81 MG tablet   Oral   Take 81 mg by mouth daily.         Marland Kitchen atorvastatin (LIPITOR) 10 MG tablet   Oral   Take 10 mg by mouth daily.         . calcium-vitamin D (OSCAL WITH D) 500-200 MG-UNIT per tablet   Oral   Take 1 tablet by mouth daily with breakfast.         . carbamazepine (TEGRETOL) 100 MG chewable tablet   Oral   Chew 1 tablet (100 mg total) by mouth 3 (three) times daily.   90 tablet   0   . glimepiride (AMARYL) 4 MG tablet   Oral   Take 1 tablet (4 mg total) by mouth 2 (two) times daily.   60 tablet   5   .  ibuprofen (ADVIL,MOTRIN) 800 MG tablet   Oral   Take 800 mg by mouth every 8 (eight) hours as needed for pain.         Marland Kitchen lisinopril-hydrochlorothiazide (PRINZIDE,ZESTORETIC) 20-12.5 MG per tablet   Oral   Take 1 tablet by mouth daily.         . meclizine (ANTIVERT) 25 MG tablet   Oral   Take 1 tablet (25 mg total) by mouth 2 (two) times daily as needed for dizziness.   30 tablet   0   . metFORMIN (GLUCOPHAGE) 850 MG tablet   Oral   Take 425-850 mg by mouth 2 (two) times daily with a meal. Takes 850mg  in the morning and 425mg  in the evening         . Multiple Vitamins-Minerals (MULTIVITAMIN WITH MINERALS) tablet   Oral   Take 1 tablet by mouth daily.         . ondansetron (ZOFRAN) 4 MG tablet   Oral   Take 1 tablet (4 mg total) by mouth every 6 (six) hours.   12 tablet   0   . oxyCODONE-acetaminophen  (ROXICET) 5-325 MG per tablet   Oral   Take 1-2 tablets by mouth every 6 (six) hours as needed for severe pain.   30 tablet   0   . letrozole (FEMARA) 2.5 MG tablet   Oral   Take 2.5 mg by mouth daily.         . ondansetron (ZOFRAN) 8 MG tablet   Oral   Take 1 tablet (8 mg total) by mouth every 8 (eight) hours as needed for nausea or vomiting.   20 tablet   0    BP 119/71  Pulse 66  Temp(Src) 97.7 F (36.5 C) (Oral)  Resp 20  Ht 4\' 11"  (1.499 m)  Wt 144 lb (65.318 kg)  BMI 29.07 kg/m2  SpO2 94% Physical Exam  Nursing note and vitals reviewed. Constitutional: She is oriented to person, place, and time. She appears well-developed and well-nourished. She appears distressed.  Patient appears uncomfortable.  HENT:  Head: Normocephalic and atraumatic.  Mouth/Throat: Oropharynx is clear and moist.  No tenderness to palpation of the temporal arteries. The maxillary or frontal sinus tenderness to percussion  Eyes: EOM are normal. Pupils are equal, round, and reactive to light.  Neck: Normal range of motion. Neck supple.  No meningismus.  Cardiovascular: Normal rate and regular rhythm.   Pulmonary/Chest: Effort normal and breath sounds normal. No respiratory distress. She has no wheezes. She has no rales. She exhibits no tenderness.  Abdominal: Soft. Bowel sounds are normal. She exhibits no distension and no mass. There is no tenderness. There is no rebound and no guarding.  Musculoskeletal: Normal range of motion. She exhibits no edema and no tenderness.  Neurological: She is alert and oriented to person, place, and time.  Patient is alert and oriented x3 with clear, goal oriented speech. Patient has 5/5 motor in all extremities. Sensation is intact to light touch. Bilateral finger-to-nose is normal with no signs of dysmetria. Patient has a normal gait and walks without assistance. Right lower facial droop noted.   Skin: Skin is warm and dry. No rash noted. No erythema.   Psychiatric: She has a normal mood and affect. Her behavior is normal.    ED Course  Procedures (including critical care time) Labs Review Labs Reviewed  CBC WITH DIFFERENTIAL - Abnormal; Notable for the following:    WBC 14.0 (*)  Hemoglobin 15.7 (*)    Lymphs Abs 5.2 (*)    All other components within normal limits  COMPREHENSIVE METABOLIC PANEL - Abnormal; Notable for the following:    Glucose, Bld 238 (*)    BUN 29 (*)    Albumin 3.4 (*)    AST 53 (*)    ALT 50 (*)    Alkaline Phosphatase 134 (*)    All other components within normal limits  CBG MONITORING, ED - Abnormal; Notable for the following:    Glucose-Capillary 236 (*)    All other components within normal limits   Imaging Review No results found.   EKG Interpretation None      MDM   Final diagnoses:  Trigeminal neuralgia syndrome    Patient is feeling much better after IV pain medication. Both her and her son had been educated in how to take her Percocet. She has establish followup with neurology. She has no new focal deficits. Safe to discharge the patient home at this time. Return precautions have been given.    Julianne Rice, MD 01/29/14 [phone]

## 2014-01-30 ENCOUNTER — Encounter: Payer: Self-pay | Admitting: Cardiology

## 2014-01-30 ENCOUNTER — Ambulatory Visit (INDEPENDENT_AMBULATORY_CARE_PROVIDER_SITE_OTHER): Payer: No Typology Code available for payment source | Admitting: Cardiology

## 2014-01-30 VITALS — BP 126/82 | HR 88 | Ht 59.0 in | Wt 142.0 lb

## 2014-01-30 DIAGNOSIS — E785 Hyperlipidemia, unspecified: Secondary | ICD-10-CM

## 2014-01-30 MED ORDER — METOCLOPRAMIDE HCL 10 MG PO TABS: 10.0000 mg | ORAL_TABLET | Freq: Three times a day (TID) | ORAL | Status: DC

## 2014-01-30 MED ORDER — PREGABALIN 50 MG PO CAPS: 50.0000 mg | ORAL_CAPSULE | Freq: Three times a day (TID) | ORAL | Status: DC

## 2014-01-30 NOTE — Patient Instructions (Addendum)
Your physician has recommended you make the following change in your medication:   1. Start Lyrica 50 mg 1 tab twice a day.  2. Reglan 10 mg three times a day thirty minutes before meal. (for heartburn and nausea)  3. You can try dulcolax, colace and milk of magnesia to help soften stool. These are OTC medications.   Your physician recommends that you return for a FASTING lipid profile: with [facility] on 07/22/14.  Your physician wants you to follow-up in: 6 months (labs should be done 2 weeks prior) You will receive a reminder letter in the mail two months in advance. If you don't receive a letter, please call our office to schedule the follow-up appointment.

## 2014-01-30 NOTE — Progress Notes (Signed)
Patient ID: Brandi Bryant, female   DOB: 1956/03/26, 58 y.o.   MRN: [id number]     Patient Name: Brandi Bryant Date of Encounter: 01/30/2014  Primary Care Provider:  Pcp Not In System Primary Cardiologist:  Dorothy Spark   Problem List   Past Medical History  Diagnosis Date  . Breast cancer   . HTN (hypertension)   . Diabetes mellitus   . Cholelithiasis   . Hepatic steatosis   . Abdominal pain   . History of breast cancer 06/01/2012  . Arthralgia 08/13/2013  . Chest pain 09/30/2013  . Stroke 2006   Past Surgical History  Procedure Laterality Date  . Tubal ligation    . Breast lumpectomy  2010   Allergies  Allergies  Allergen Reactions  . 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 She was on adjuvant hormonal therapy with tamoxifen started in August 2010. In March 2014: her treatment is switched to Femara. Her most recent CAT scan showed no evidence of metastatic disease. The same CT chest showed calcifications of her coronary vessels including left main and LAD.  The patient has been experiencing exertional dyspnea after walking a flight of stairs, that eventually progresses to chest pain on almost any exertion. This has been going on for last couple weeks. The patient describes her pain as someone sitting on her chest with radiation to her neck and palpitations. After she takes stress her pain resolves in about 10 minutes. She denies syncope orthopnea, paroxysmal nocturnal dyspnea, lower extremity edema. She has never smoked.  No significant family history of coronary artery disease.  She underwent a cardiac cath that showed moderate non-obstructive CAD. She reports improvement of symptoms. She was started on low dose atorvastatin considering her fatty lipids. 3 weeks ago she went to the ED R. and was diagnosed with Bell's palsy. Since then she has been experiencing severe headaches for which he takes oxycodone resulting in nausea and constipation. She looks very uncomfortable today and states that her main problem is headache she occasionally feels mild right-sided chest pains.  Home Medications  Prior to Admission medications   Medication Sig Start Date End Date Taking? Authorizing Provider  ibuprofen (ADVIL,MOTRIN) 800 MG tablet Take 800 mg by mouth every 8 (eight) hours as needed for pain.   Yes Historical Provider, MD  letrozole (FEMARA) 2.5 MG tablet TAKE 1 TABLET BY MOUTH ONCE DAILY 09/30/13  Yes Heath Lark, MD  lisinopril-hydrochlorothiazide (PRINZIDE,ZESTORETIC) 10-12.5 MG per tablet Take 1 tablet by mouth daily.   Yes Historical Provider, MD  metFORMIN (GLUCOPHAGE) 500 MG tablet Take 500 mg by mouth 2 (two) times daily with a meal.   Yes Historical Provider, MD  Multiple Vitamins-Minerals (MULTIVITAMIN WITH MINERALS) tablet Take 1 tablet by mouth daily.   Yes Historical Provider, MD    Family History  Family History  Problem Relation Age of Onset  . Thyroid cancer Sister     thyroid    Social History  History   Social History  . Marital Status: Single    Spouse Name: N/A  Number of Children: N/A  . Years of Education: N/A   Occupational History  . Not on file.   Social History Main Topics  . Smoking status: Never Smoker   . Smokeless tobacco: Not on file  . Alcohol Use: No  . Drug Use: No  . Sexual Activity: Not Currently    Birth Control/ Protection: Surgical   Other Topics Concern  . Not on file   Social History Narrative  . No narrative on file     Review of Systems, as  per HPI, otherwise negative General:  No chills, fever, night sweats or weight changes.  Cardiovascular:  No chest pain, dyspnea on exertion, edema, orthopnea, palpitations, paroxysmal nocturnal dyspnea. Dermatological: No rash, lesions/masses Respiratory: No cough, dyspnea Urologic: No hematuria, dysuria Abdominal:   No nausea, vomiting, diarrhea, bright red blood per rectum, melena, or hematemesis Neurologic:  No visual changes, wkns, changes in mental status. All other systems reviewed and are otherwise negative except as noted above.  Physical Exam  BP 124/70, HR 74 General: Pleasant, in mild acute distress,  Psych: Normal affect. Neuro: Alert and oriented X 3. Moves all extremities spontaneously. 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 Lungs:  Resp regular and unlabored, CTA. Heart: RRR no s3, s4, or murmurs. Abdomen: Soft, non-tender, non-distended, BS + x 4.  Extremities: No clubbing, cyanosis or edema. DP/PT/Radials 2+ and equal bilaterally.  Labs:  No results found for this basename: CKTOTAL, CKMB, TROPONINI,  in the last 72 hours Lab Results  Component Value Date   WBC 14.0* 01/28/2014   HGB 15.7* 01/28/2014   HCT 43.6 01/28/2014   MCV 86.0 01/28/2014   PLT 205 01/28/2014     Recent Labs Lab 01/28/14 0448  NA 139  K 4.7  CL 96  CO2 21  BUN 29*  CREATININE 0.77  CALCIUM 9.7  PROT 7.0  BILITOT 0.4  ALKPHOS 134*  ALT 50*  AST 53*  GLUCOSE 238*   Lab Results  Component Value Date   CHOL 158 10/28/2013   HDL 39.20 10/28/2013   LDLCALC 86 10/28/2013   TRIG 165.0* 10/28/2013   Accessory Clinical Findings  ECG - normal sinus rhythm, 74 beats per minutes, normal EKG  Cardiac cath 10/30/13  Hemodynamics:  AO 123 73  LV 133 14  Coronary angiography:  Coronary dominance: right  Left mainstem: Normal  Left anterior descending (LAD): 30% calcified disease proximally  D1: 30--40%  mid vessel disease  D2: 50% mid vessel disease  D3: small normal  Left circumflex (LCx): Normal  OM1: large vessel normal  Right coronary artery (RCA): Large dominant vessel normal  Left ventriculography: Left ventricular systolic function is normal, LVEF is estimated at 55-65%, there is no significant mitral regurgitation  Final Conclusions: Films reviewed with Dr Martinique He agrees high diagonal vessels are not flow limiting Continue to Rx risk factors  Appears to have noncardiac chest pain  Recommendations: Medical Rx  Jenkins Rouge  10/30/2013, 12:24 PM    Assessment & Plan  59 year old female  1. Typical exertional chest pain - in a patient with multiple risk factors including diabetes, uncontrolled hypertension, prior radiation therapy, and evidence of coronary calcification on chest CT scan. She underwent a cardiac cath that showed moderate non-obstructive CAD.  The patient was started on 10 mg of atorvastatin, her TAG is 165 and LDL 86, goal <70, considering CAD and DM< she  should be on moderate to high dose statin. AST and ALT are mildly elevated in the 50s, these can be blamed on her family were we will continue to closely monitor.  2. Hypertension - controlled after adding carvedilol 6.25 mg by mouth BID   3. Hyperlipidemia - as per 1.  4. Bell's palsy and nausea and constipation associated with the pain medicine we will prescribe metoclopramide and she was advised to use stool softeners.  5. diabetic neuropathy - start patient on workup 50 mg twice a day  Follow up in 6 months, liver enzymes and lipid profile prior to the visit.Dorothy Spark, MD, [facility] 01/30/2014, 9:49 AM

## 2014-02-04 ENCOUNTER — Ambulatory Visit: Payer: No Typology Code available for payment source | Attending: Internal Medicine

## 2014-02-10 ENCOUNTER — Ambulatory Visit: Payer: Self-pay | Admitting: Hematology and Oncology

## 2014-02-10 ENCOUNTER — Other Ambulatory Visit: Payer: Self-pay | Admitting: Lab

## 2014-02-12 ENCOUNTER — Ambulatory Visit: Payer: No Typology Code available for payment source | Attending: Internal Medicine | Admitting: Internal Medicine

## 2014-02-12 VITALS — BP 129/81 | HR 87 | Temp 98.0°F | Resp 16 | Ht 59.0 in | Wt 145.0 lb

## 2014-02-12 DIAGNOSIS — Z79899 Other long term (current) drug therapy: Secondary | ICD-10-CM | POA: Insufficient documentation

## 2014-02-12 DIAGNOSIS — G510 Bell's palsy: Secondary | ICD-10-CM | POA: Insufficient documentation

## 2014-02-12 DIAGNOSIS — E785 Hyperlipidemia, unspecified: Secondary | ICD-10-CM | POA: Insufficient documentation

## 2014-02-12 DIAGNOSIS — G500 Trigeminal neuralgia: Secondary | ICD-10-CM

## 2014-02-12 DIAGNOSIS — E119 Type 2 diabetes mellitus without complications: Secondary | ICD-10-CM

## 2014-02-12 DIAGNOSIS — I639 Cerebral infarction, unspecified: Secondary | ICD-10-CM

## 2014-02-12 DIAGNOSIS — Z853 Personal history of malignant neoplasm of breast: Secondary | ICD-10-CM | POA: Insufficient documentation

## 2014-02-12 DIAGNOSIS — E1142 Type 2 diabetes mellitus with diabetic polyneuropathy: Secondary | ICD-10-CM | POA: Insufficient documentation

## 2014-02-12 DIAGNOSIS — I10 Essential (primary) hypertension: Secondary | ICD-10-CM | POA: Insufficient documentation

## 2014-02-12 DIAGNOSIS — Z7982 Long term (current) use of aspirin: Secondary | ICD-10-CM | POA: Insufficient documentation

## 2014-02-12 DIAGNOSIS — I6350 Cerebral infarction due to unspecified occlusion or stenosis of unspecified cerebral artery: Secondary | ICD-10-CM

## 2014-02-12 DIAGNOSIS — E1149 Type 2 diabetes mellitus with other diabetic neurological complication: Secondary | ICD-10-CM | POA: Insufficient documentation

## 2014-02-12 DIAGNOSIS — Z8673 Personal history of transient ischemic attack (TIA), and cerebral infarction without residual deficits: Secondary | ICD-10-CM | POA: Insufficient documentation

## 2014-02-12 MED ORDER — METFORMIN HCL 850 MG PO TABS: 425.0000 mg | ORAL_TABLET | Freq: Two times a day (BID) | ORAL | Status: DC

## 2014-02-12 MED ORDER — METOCLOPRAMIDE HCL 10 MG PO TABS: 10.0000 mg | ORAL_TABLET | Freq: Three times a day (TID) | ORAL | Status: DC

## 2014-02-12 NOTE — Patient Instructions (Signed)
Parálisis de Bell  (Bell's Palsy)  La parálisis de Bell es un trastorno en el que los músculos de un lado de la cara no pueden moverse (parálisis). Esto se debe a que los nervios de la cara están paralizados. Se cree que es producido por un virus. El virus causa inflamación del nervio que controla los movimientos de un lado de la cara. El nervio pasa a través de un espacio angosto, rodeado de hueso. Cuando el nervio se inflama, el hueso puede comprimirlo. Esto da como resultado un daño a la cubierta protectora que rodea el nervio. Este daño interfiere con la forma en la que el nervio se comunica con los músculos de la cara. Como resultado, puede causar debilidad o parálisis en los músculos faciales.   Las lesiones (traumatismos), tumores y cirugía pueden causar parálisis de Bell, pero la mayoría de las veces se desconoce la causa. Es un trastorno bastante frecuente. Comienza de repente (aparición abrupta) y la parálisis por lo general desaparece en 2 días. La parálisis de Bell no es peligrosa. Pero como el ojo no se cierra adecuadamente, necesitará tomar algunos recaudos para que no se seque. Esto puede incluir un vendaje (mantener el ojo cerrado) o humedecerlo con lágrimas artificiales. Rara vez ocurre en ambos lados al mismo tiempo.  SÍNTOMAS  · Ceja caída.  · Caída del ojo y comisura de la boca.  · Incapacidad para cerrar un ojo.  · Pérdida del sentido del gusto en la parte anterior de la lengua.  · Sensibilidad a ruidos fuertes.  TRATAMIENTO  El tratamiento por lo general no es quirúrgico. Si el paciente fue atendido dentro de las primeras 24 a 48 horas puede que se le haya prescrito un tratamiento corto con esteroides para intentar acortar el curso de la enfermedad. También se podrán utilizar medicamentos antivirales junto con los esteroides, pero no está claro si son útiles.   Necesitará proteger el ojo si no puede cerrarlo. La córnea (cubierta transparente del ojo) podría secarse y dañarse. Se podrán utilizar  lágrimas artificiales para mantener el ojo lubricado. Se deberán utilizar lentes o parches para proteger el ojo.  PRONÓSTICO  El tiempo de recuperación es variable y puede tardar desde algunos días a varios meses. Aunque en general se cura completamente, (en un 80% de los casos aproximadamente), las consecuencias no pueden predecirse. La mayoría de las personas mejoran dentro de las 3 semanas del comienzo de los síntomas. Las mejoras deberán continuar por entre 3 y 6 meses. Una pequeña cantidad de personas presentan debilidad moderada a grave que es permanente.   INSTRUCCIONES PARA EL CUIDADO DOMICILIARIO  · Si su médico le prescribe medicamentos para controlar la inflamación del nervio, tómela de la manera indicada. No suspenda los medicamentos a menos que se lo haya indicado el profesional que le asiste.  · Utilice gotas oculares para humedecer los ojos y prevenir la sequedad, de la manera en que se le indique.  · Proteja sus ojos de la manera en que se lo indique el profesional que le asiste.  · Practique masajes faciales y ejercicios de la manera que se lo indique el profesional que le asiste.  · Realice sus actividades normales y procure un descanso regular.  SOLICITE ATENCIÓN MÉDICA DE INMEDIATO SI:  · Presenta dolor, enrojecimiento o irritación en el ojo.  · Usted o su niño tienen una temperatura oral de más de 38,9° C (102° F) y no puede controlarla con medicamentos.  ESTÉ SEGURO QUE:   · Comprende las instrucciones   Information 2014 [employer]  

## 2014-02-12 NOTE — Progress Notes (Signed)
CC: Followup  HPI: 58 year old female with past medical history of stroke, Bell's palsy, diabetes, hypertension dyslipidemia who presented to clinic for followup and need for neurology referral. Patient reports having worsening neuropathic pain. She takes Lyrica for this. She reports some improvement with Lyrica. No chest pain or shortness of breath. No headaches. No blurred vision.  Allergies  Allergen Reactions  . Gadolinium Derivatives     Code: VOM, Desc: Pt began vomiting 45 sec after MRI contrast injection of Multihance, Onset Date: [id number]     Past Medical History  Diagnosis Date  . Breast cancer   . HTN (hypertension)   . Diabetes mellitus   . Cholelithiasis   . Hepatic steatosis   . Abdominal pain   . History of breast cancer 06/01/2012  . Arthralgia 08/13/2013  . Chest pain 09/30/2013  . Stroke 2006   Current Outpatient Prescriptions on File Prior to Visit  Medication Sig Dispense Refill  . aspirin EC 81 MG tablet Take 81 mg by mouth daily.      . carbamazepine (TEGRETOL) 100 MG chewable tablet Chew 1 tablet (100 mg total) by mouth 3 (three) times daily.  90 tablet  0  . glimepiride (AMARYL) 4 MG tablet Take 1 tablet (4 mg total) by mouth 2 (two) times daily.  60 tablet  5  . letrozole (FEMARA) 2.5 MG tablet Take 2.5 mg by mouth daily.      Marland Kitchen lisinopril-hydrochlorothiazide (PRINZIDE,ZESTORETIC) 20-12.5 MG per tablet Take 1 tablet by mouth daily.      Marland Kitchen oxyCODONE-acetaminophen (ROXICET) 5-325 MG per tablet Take 1-2 tablets by mouth every 6 (six) hours as needed for severe pain.  30 tablet  0  . pregabalin (LYRICA) 50 MG capsule Take 1 capsule (50 mg total) by mouth 3 (three) times daily.  60 capsule  6  . acetaminophen (TYLENOL) 325 MG tablet Take 2 tablets (650 mg total) by mouth every 6 (six) hours as needed for mild pain (or Fever >/= 101).      Marland Kitchen atorvastatin (LIPITOR) 10 MG tablet Take 10 mg by mouth daily.      . calcium-vitamin D (OSCAL WITH D) 500-200 MG-UNIT per  tablet Take 1 tablet by mouth daily with breakfast.      . ibuprofen (ADVIL,MOTRIN) 800 MG tablet Take 800 mg by mouth every 8 (eight) hours as needed for pain.      . meclizine (ANTIVERT) 25 MG tablet Take 1 tablet (25 mg total) by mouth 2 (two) times daily as needed for dizziness.  30 tablet  0  . Multiple Vitamins-Minerals (MULTIVITAMIN WITH MINERALS) tablet Take 1 tablet by mouth daily.      . ondansetron (ZOFRAN) 4 MG tablet Take 1 tablet (4 mg total) by mouth every 6 (six) hours.  12 tablet  0  . ondansetron (ZOFRAN) 8 MG tablet Take 1 tablet (8 mg total) by mouth every 8 (eight) hours as needed for nausea or vomiting.  20 tablet  0  . [DISCONTINUED] escitalopram (LEXAPRO) 10 MG tablet Take 10 mg by mouth daily.       No current facility-administered medications on file prior to visit.   Family History  Problem Relation Age of Onset  . Thyroid cancer Sister     thyroid   History   Social History  . Marital Status: Single    Spouse Name: N/A    Number of Children: N/A  . Years of Education: N/A   Occupational History  . Not on file.  Social History Main Topics  . Smoking status: Never Smoker   . Smokeless tobacco: Not on file  . Alcohol Use: No  . Drug Use: No  . Sexual Activity: Not Currently    Birth Control/ Protection: Surgical   Other Topics Concern  . Not on file   Social History Narrative  . No narrative on file    Review of Systems  Constitutional: Negative for fever, chills, diaphoresis, activity change, appetite change and fatigue.  HENT: Negative for ear pain, nosebleeds, congestion, facial swelling, rhinorrhea, neck pain, neck stiffness and ear discharge.   Eyes: Negative for pain, discharge, redness, itching and visual disturbance.  Respiratory: Negative for cough, choking, chest tightness, shortness of breath, wheezing and stridor.   Cardiovascular: Negative for chest pain, palpitations and leg swelling.  Gastrointestinal: Negative for abdominal  distention.  Genitourinary: Negative for dysuria, urgency, frequency, hematuria, flank pain, decreased urine volume, difficulty urinating and dyspareunia.  Musculoskeletal: Negative for back pain, joint swelling, arthralgias and gait problem.  Neurological: Positive for neurology, Bell's palsy Hematological: Negative for adenopathy. Does not bruise/bleed easily.  Psychiatric/Behavioral: Negative for hallucinations, behavioral problems, confusion, dysphoric mood, decreased concentration and agitation.    Objective:   Filed Vitals:   02/12/14 1228  BP: 129/81  Pulse: 87  Temp: 98 F (36.7 C)  Resp: 16    Physical Exam  Constitutional: Appears well-developed and well-nourished. No distress.  HENT: Normocephalic. External right and left ear normal. Oropharynx is clear and moist.  Eyes: Conjunctivae and EOM are normal. PERRLA, no scleral icterus.  Neck: Normal ROM. Neck supple. No JVD. No tracheal deviation. No thyromegaly.  CVS: RRR, S1/S2 +, no murmurs, no gallops, no carotid bruit.  Pulmonary: Effort and breath sounds normal, no stridor, rhonchi, wheezes, rales.  Abdominal: Soft. BS +,  no distension, tenderness, rebound or guarding.  Musculoskeletal: Normal range of motion. No edema and no tenderness.  Lymphadenopathy: No lymphadenopathy noted, cervical, inguinal. Neuro: She has left facial droop and left ptosis Skin: Skin is warm and dry. No rash noted. Not diaphoretic. No erythema. No pallor.  Psychiatric: Normal mood and affect. Behavior, judgment, thought content normal.   Lab Results  Component Value Date   WBC 14.0* 01/28/2014   HGB 15.7* 01/28/2014   HCT 43.6 01/28/2014   MCV 86.0 01/28/2014   PLT 205 01/28/2014   Lab Results  Component Value Date   CREATININE 0.77 01/28/2014   BUN 29* 01/28/2014   NA 139 01/28/2014   K 4.7 01/28/2014   CL 96 01/28/2014   CO2 21 01/28/2014    Lab Results  Component Value Date   HGBA1C 9.0* 01/21/2014   Lipid Panel     Component Value  Date/Time   CHOL 158 10/28/2013 1140   TRIG 165.0* 10/28/2013 1140   HDL 39.20 10/28/2013 1140   CHOLHDL 4 10/28/2013 1140   VLDL 33.0 10/28/2013 1140   LDLCALC 86 10/28/2013 1140       Assessment and plan:   Patient Active Problem List   Diagnosis Date Noted  . Trigeminal neuralgia, Bells palsy 01/22/2014    Priority: Medium - referral to neurology provided - continue lyrica  . Stroke 01/15/2014    Priority: Medium - Followup with neurology   . HTN (hypertension)     Priority: Medium - Continue Prinzide   . Diabetes mellitus     Priority: Medium - Continue Amaryl and metformin  - Last A1c 9 obtained March 2015. It indicates poor glycemic control   .  Nausea & vomiting - Secondary to gastroparesis related to uncontrolled diabetes. Continue Reglan  01/31/2012

## 2014-02-12 NOTE — Progress Notes (Signed)
Pt is here following up on her diabetes and bells palsy. Pt has an interpretor.

## 2014-02-20 ENCOUNTER — Emergency Department (HOSPITAL_COMMUNITY)
Admission: EM | Admit: 2014-02-20 | Discharge: 2014-02-20 | Disposition: A | Discharge status: Nursing Home (Includes ICF) | Payer: No Typology Code available for payment source | Source: Home / Self Care

## 2014-02-20 ENCOUNTER — Emergency Department (HOSPITAL_COMMUNITY): Payer: No Typology Code available for payment source

## 2014-02-20 ENCOUNTER — Observation Stay (HOSPITAL_COMMUNITY)
Admission: EM | Admit: 2014-02-20 | Discharge: 2014-02-23 | Disposition: A | Discharge status: Skilled Nursing Facility | Payer: No Typology Code available for payment source | Attending: Internal Medicine | Admitting: Internal Medicine

## 2014-02-20 ENCOUNTER — Encounter (HOSPITAL_COMMUNITY): Payer: Self-pay | Admitting: Emergency Medicine

## 2014-02-20 DIAGNOSIS — R29810 Facial weakness: Secondary | ICD-10-CM

## 2014-02-20 DIAGNOSIS — R259 Unspecified abnormal involuntary movements: Secondary | ICD-10-CM | POA: Insufficient documentation

## 2014-02-20 DIAGNOSIS — Z8673 Personal history of transient ischemic attack (TIA), and cerebral infarction without residual deficits: Secondary | ICD-10-CM | POA: Insufficient documentation

## 2014-02-20 DIAGNOSIS — Z853 Personal history of malignant neoplasm of breast: Secondary | ICD-10-CM | POA: Insufficient documentation

## 2014-02-20 DIAGNOSIS — R05 Cough: Secondary | ICD-10-CM | POA: Insufficient documentation

## 2014-02-20 DIAGNOSIS — E119 Type 2 diabetes mellitus without complications: Secondary | ICD-10-CM | POA: Diagnosis present

## 2014-02-20 DIAGNOSIS — G510 Bell's palsy: Secondary | ICD-10-CM | POA: Diagnosis present

## 2014-02-20 DIAGNOSIS — Z7982 Long term (current) use of aspirin: Secondary | ICD-10-CM | POA: Insufficient documentation

## 2014-02-20 DIAGNOSIS — M2550 Pain in unspecified joint: Secondary | ICD-10-CM

## 2014-02-20 DIAGNOSIS — G459 Transient cerebral ischemic attack, unspecified: Secondary | ICD-10-CM

## 2014-02-20 DIAGNOSIS — E1165 Type 2 diabetes mellitus with hyperglycemia: Secondary | ICD-10-CM

## 2014-02-20 DIAGNOSIS — R531 Weakness: Secondary | ICD-10-CM

## 2014-02-20 DIAGNOSIS — R269 Unspecified abnormalities of gait and mobility: Secondary | ICD-10-CM | POA: Insufficient documentation

## 2014-02-20 DIAGNOSIS — IMO0002 Reserved for concepts with insufficient information to code with codable children: Secondary | ICD-10-CM

## 2014-02-20 DIAGNOSIS — R519 Headache, unspecified: Secondary | ICD-10-CM | POA: Diagnosis present

## 2014-02-20 DIAGNOSIS — I2510 Atherosclerotic heart disease of native coronary artery without angina pectoris: Secondary | ICD-10-CM

## 2014-02-20 DIAGNOSIS — K8020 Calculus of gallbladder without cholecystitis without obstruction: Secondary | ICD-10-CM | POA: Insufficient documentation

## 2014-02-20 DIAGNOSIS — R29818 Other symptoms and signs involving the nervous system: Secondary | ICD-10-CM

## 2014-02-20 DIAGNOSIS — E785 Hyperlipidemia, unspecified: Secondary | ICD-10-CM

## 2014-02-20 DIAGNOSIS — R059 Cough, unspecified: Secondary | ICD-10-CM | POA: Insufficient documentation

## 2014-02-20 DIAGNOSIS — I10 Essential (primary) hypertension: Secondary | ICD-10-CM | POA: Diagnosis present

## 2014-02-20 DIAGNOSIS — K7689 Other specified diseases of liver: Secondary | ICD-10-CM | POA: Insufficient documentation

## 2014-02-20 DIAGNOSIS — R51 Headache: Secondary | ICD-10-CM | POA: Insufficient documentation

## 2014-02-20 DIAGNOSIS — G500 Trigeminal neuralgia: Secondary | ICD-10-CM | POA: Diagnosis present

## 2014-02-20 HISTORY — DX: Transient cerebral ischemic attack, unspecified: G45.9

## 2014-02-20 LAB — CBC
HCT: 39.8 % (ref 36.0–46.0)
Hemoglobin: 14.1 g/dL (ref 12.0–15.0)
MCH: 30.7 pg (ref 26.0–34.0)
MCHC: 35.4 g/dL (ref 30.0–36.0)
MCV: 86.5 fL (ref 78.0–100.0)
PLATELETS: 224 10*3/uL (ref 150–400)
RBC: 4.6 MIL/uL (ref 3.87–5.11)
RDW: 13.6 % (ref 11.5–15.5)
WBC: 8.1 10*3/uL (ref 4.0–10.5)

## 2014-02-20 LAB — COMPREHENSIVE METABOLIC PANEL
ALT: 45 U/L — ABNORMAL HIGH (ref 0–35)
AST: 80 U/L — ABNORMAL HIGH (ref 0–37)
Albumin: 3.8 g/dL (ref 3.5–5.2)
Alkaline Phosphatase: 98 U/L (ref 39–117)
BILIRUBIN TOTAL: 0.4 mg/dL (ref 0.3–1.2)
BUN: 11 mg/dL (ref 6–23)
CALCIUM: 9.7 mg/dL (ref 8.4–10.5)
CO2: 19 mEq/L (ref 19–32)
Chloride: 103 mEq/L (ref 96–112)
Creatinine, Ser: 0.63 mg/dL (ref 0.50–1.10)
Glucose, Bld: 169 mg/dL — ABNORMAL HIGH (ref 70–99)
POTASSIUM: 4.2 meq/L (ref 3.7–5.3)
Sodium: 142 mEq/L (ref 137–147)
TOTAL PROTEIN: 7.5 g/dL (ref 6.0–8.3)

## 2014-02-20 LAB — DIFFERENTIAL
BASOS PCT: 1 % (ref 0–1)
Basophils Absolute: 0 10*3/uL (ref 0.0–0.1)
Eosinophils Absolute: 0.3 10*3/uL (ref 0.0–0.7)
Eosinophils Relative: 4 % (ref 0–5)
Lymphocytes Relative: 39 % (ref 12–46)
Lymphs Abs: 3.1 10*3/uL (ref 0.7–4.0)
Monocytes Absolute: 0.9 10*3/uL (ref 0.1–1.0)
Monocytes Relative: 12 % (ref 3–12)
NEUTROS PCT: 46 % (ref 43–77)
Neutro Abs: 3.7 10*3/uL (ref 1.7–7.7)

## 2014-02-20 LAB — PROTIME-INR
INR: 1.02 (ref 0.00–1.49)
PROTHROMBIN TIME: 13.2 s (ref 11.6–15.2)

## 2014-02-20 LAB — CBG MONITORING, ED: GLUCOSE-CAPILLARY: 127 mg/dL — AB (ref 70–99)

## 2014-02-20 LAB — APTT: aPTT: 28 seconds (ref 24–37)

## 2014-02-20 MED ORDER — BUTALBITAL-APAP-CAFFEINE 50-325-40 MG PO TABS
1.0000 | ORAL_TABLET | ORAL | Status: DC | PRN
  Administered 2014-02-20 – 2014-02-23 (×6): 2 via ORAL
  Filled 2014-02-20 (×6): qty 2

## 2014-02-20 MED ORDER — SODIUM CHLORIDE 0.9 % IV SOLN
Freq: Once | INTRAVENOUS | Status: AC
  Administered 2014-02-20: 19:00:00 via INTRAVENOUS

## 2014-02-20 MED ORDER — KETOROLAC TROMETHAMINE 30 MG/ML IJ SOLN
30.0000 mg | Freq: Once | INTRAMUSCULAR | Status: AC
  Administered 2014-02-20: 30 mg via INTRAVENOUS
  Filled 2014-02-20: qty 1

## 2014-02-20 MED ORDER — PROCHLORPERAZINE EDISYLATE 5 MG/ML IJ SOLN
10.0000 mg | Freq: Once | INTRAMUSCULAR | Status: AC
  Administered 2014-02-20: 10 mg via INTRAVENOUS
  Filled 2014-02-20: qty 2

## 2014-02-20 MED ORDER — SODIUM CHLORIDE 0.9 % IV SOLN
INTRAVENOUS | Status: AC
  Administered 2014-02-20: via INTRAVENOUS

## 2014-02-20 NOTE — ED Notes (Signed)
Pt c/o constant abd pain onset 3 days Pain increases w/food; radiates to the back Sxs also include: nauseas, dysuria, trembling BP today is 202/105; reports she did not take meds today to see if pain alleviated.  Reports HA, dizziness, anxiety Denies inj/trauma, f/v/d, hematuria Alert but very anxious.

## 2014-02-20 NOTE — Consult Note (Addendum)
Neurology Consultation Reason for Consult: tremor Referring Physician: Darl Householder, D  CC: tremor  History is obtained from:patient(with aid of interpreter)  HPI: Brandi Bryant is a 58 y.o. female with a history of stroke, hypertension who presents with three days of tremor, dizziness, intermittent chest pain, shortness of breath, malaise, headache. She describes the headache as unilateral on the right, assocaited with photophobia. She is afebrile.   She has persistent tremor on the right side that waxes and wanes. She states that she is unable to control it. It has gotten much worse today but has been present for three days.    LKW: 3/31 tpa given?: no, outside of window.     ROS: limited by language barrier, but negative with exception of HPI   Past Medical History  Diagnosis Date  . Breast cancer   . HTN (hypertension)   . Diabetes mellitus   . Cholelithiasis   . Hepatic steatosis   . Abdominal pain   . History of breast cancer 06/01/2012  . Arthralgia 08/13/2013  . Chest pain 09/30/2013  . Stroke 2006    Family History: Unable to obtain due to patient distress  Social History: Tob: unable to obtain due to patient distress  Exam: Current vital signs: BP 162/99  Pulse 111  Resp 22  SpO2 96% Vital signs in last 24 hours: Temp:  [98 Bryant (36.7 C)] 98 Bryant (36.7 C) (04/02 1900) Pulse Rate:  [104-111] 111 (04/02 1951) Resp:  [22] 22 (04/02 1951) BP: (162-202)/(99-105) 162/99 mmHg (04/02 1951) SpO2:  [95 %-96 %] 96 % (04/02 1951)  General: in bed, appears uncomfortable CV: RRR Mental Status: Patient is awake, alert, oriented to person, states she "does not remember" to month. Does get age correct.  No signs of aphasia or neglect Cranial Nerves: II: Able to finger count in all fields, possible subtotal right field cut. Pupils are equal, round, and reactive to light.  Discs are difficult to visualize. III,IV, VI: EOMI without ptosis or diploplia.  V: Facial  sensation is decreased on the right to touch, pin, and vibration.  VII: Facial movement is notable for right facial weakness VIII: hearing is intact to voice X: Uvula elevates symmetrically XI: Shoulder shrug is symmetric. XII: tongue is midline without atrophy or fasciculations.  Motor: Bulk is normal. 5/5 strength was present in all four extremities, she refuses to use right arm initially, holding it tight, but with minimal distraction is able to lift it. She has marked distractible tremor. At times the tremor is bilateral with preserved consciousness. With any movement of her right arm, the tremor abates and spreads into her leg.  Sensory: Sensation is decreased on right face and arm.  Deep Tendon Reflexes: Difficulty getting patient to relax enough for accurate reflexes.  Cerebellar: FNF with marked tremor right > left Gait: Not assessed due to acute nature of evaluation and multiple medical monitors in ED setting.   I have reviewed labs in epic and the results pertinent to this consultation are: CBC - unremarkable.   I have reviewed the images obtained: CT head - previous left occipital infarct.   Impression: Brandi Bryant with distractible right sided shaking and numbness. She has multiple findings concerning for a non-organic etiology to her complaints. The bilateral shaking with preserved conciousness would not be consistent with seizure and the splitting midline to vibration also concerning for non-organic etiology. That being said, with here severe hypertension and history of previous stroke, there is a possibility of real symptoms  with superimposed psychogenic overlay and would therefore favor getting an MRI to rule out possible stroke.   Recommendations: 1) MRI brain w/o contrast.  2) No stroke workup needed unless this is positive.  3) Could get EEG to rule out seizure.  4) Compazine/toradol for headache 5) Further recs follwoing these tests.    Roland Rack, MD [facility] [phone]  If 7pm- 7am, please page neurology on call as listed in [facility].

## 2014-02-20 NOTE — ED Notes (Addendum)
Per [facility], pt presents with stroke like symptoms. Pt is having right sided weakness, along with right sided facial droop. Pt is having right arm shaking.  Pt also reports SOB, abd pain, and HA x 3 days. Dr. Leonel Ramsay at the bedside. Pt only reports H/A at this time. Dr. Leonel Ramsay at the bedside.

## 2014-02-20 NOTE — ED Provider Notes (Signed)
CSN: [id number]     Arrival date & time 02/20/14  1930 History   First MD Initiated Contact with Patient 02/20/14 2026     Chief Complaint  Patient presents with  . Code Stroke     (Consider location/radiation/quality/duration/timing/severity/associated sxs/prior Treatment) The history is provided by the patient. The history is limited by a language barrier. A language interpreter was used.  Brandi Bryant is a 58 y.o. female hx of HTN, DM, previous strokes here with shakiness and weakness. She notes some hand tremors for the last 2-3 days. Also worsening weakness. Also has some headache as well. Went to urgent care and was sent in for possible stroke. Code stroke activated the EMS.     Family translating  Past Medical History  Diagnosis Date  . Breast cancer   . HTN (hypertension)   . Diabetes mellitus   . Cholelithiasis   . Hepatic steatosis   . Abdominal pain   . History of breast cancer 06/01/2012  . Arthralgia 08/13/2013  . Chest pain 09/30/2013  . Stroke 2006   Past Surgical History  Procedure Laterality Date  . Tubal ligation    . Breast lumpectomy  2010   Family History  Problem Relation Age of Onset  . Thyroid cancer Sister     thyroid   History  Substance Use Topics  . Smoking status: Never Smoker   . Smokeless tobacco: Not on file  . Alcohol Use: No   OB History   Grav Para Term Preterm Abortions TAB SAB Ect Mult Living   6 6 6       6      Review of Systems  Neurological: Positive for tremors, weakness and headaches.  All other systems reviewed and are negative.      Allergies  Gadolinium derivatives  Home Medications   Current Outpatient Rx  Name  Route  Sig  Dispense  Refill  . acetaminophen (TYLENOL) 500 MG tablet   Oral   Take 1,000 mg by mouth 5 (five) times daily as needed (pain).         Marland Kitchen aspirin 81 MG chewable tablet   Oral   Chew by mouth daily.         . B Complex-C (B-COMPLEX WITH VITAMIN C) tablet   Oral  Take 1 tablet by mouth daily.         . calcium-vitamin D (OSCAL WITH D) 500-200 MG-UNIT per tablet   Oral   Take 1 tablet by mouth daily with breakfast.         . carbamazepine (TEGRETOL) 100 MG chewable tablet   Oral   Chew 1 tablet (100 mg total) by mouth 3 (three) times daily.   90 tablet   0   . glimepiride (AMARYL) 4 MG tablet   Oral   Take 4 mg by mouth daily with breakfast.         . ibuprofen (ADVIL,MOTRIN) 200 MG tablet   Oral   Take 800 mg by mouth every 4 (four) hours as needed (pain).         Marland Kitchen letrozole (FEMARA) 2.5 MG tablet   Oral   Take 2.5 mg by mouth daily.         Marland Kitchen lisinopril-hydrochlorothiazide (PRINZIDE,ZESTORETIC) 20-12.5 MG per tablet   Oral   Take 1 tablet by mouth daily.         . metFORMIN (GLUCOPHAGE) 850 MG tablet   Oral   Take 0.5-1 tablets (425-850 mg total) by mouth  2 (two) times daily with a meal. Takes 850mg  in the morning and 425mg  in the evening   60 tablet   3   . metoCLOPramide (REGLAN) 10 MG tablet   Oral   Take 1 tablet (10 mg total) by mouth 3 (three) times daily before meals.   90 tablet   3   . pregabalin (LYRICA) 50 MG capsule   Oral   Take 1 capsule (50 mg total) by mouth 3 (three) times daily.   60 capsule   6    BP 124/76  Pulse 89  Temp(Src) 98.2 F (36.8 C) (Oral)  Resp 17  SpO2 95% Physical Exam  Nursing note and vitals reviewed. Constitutional: She is oriented to person, place, and time.  Anxious   HENT:  Head: Normocephalic.  Mouth/Throat: Oropharynx is clear and moist.  Eyes: Conjunctivae are normal. Pupils are equal, round, and reactive to light.  Neck: Normal range of motion. Neck supple.  Cardiovascular: Normal rate, regular rhythm and normal heart sounds.   Pulmonary/Chest: Effort normal and breath sounds normal. No respiratory distress. She has no wheezes. She has no rales.  Abdominal: Soft. Bowel sounds are normal. She exhibits no distension. There is no tenderness. There is no  rebound and no guarding.  Musculoskeletal: Normal range of motion. She exhibits no edema and no tenderness.  Neurological: She is alert and oriented to person, place, and time.  Intentional tremors, no dysmetria. Nl strength throughout   Skin: Skin is warm and dry.  Psychiatric: She has a normal mood and affect. Her behavior is normal. Judgment and thought content normal.    ED Course  Procedures (including critical care time) Labs Review Labs Reviewed  COMPREHENSIVE METABOLIC PANEL - Abnormal; Notable for the following:    Glucose, Bld 169 (*)    AST 80 (*)    ALT 45 (*)    All other components within normal limits  CBG MONITORING, ED - Abnormal; Notable for the following:    Glucose-Capillary 127 (*)    All other components within normal limits  PROTIME-INR  APTT  CBC  DIFFERENTIAL  I-STAT CHEM 8, ED  I-STAT TROPOININ, ED   Imaging Review Ct Head (brain) Wo Contrast  02/20/2014   CLINICAL DATA:  Code stroke. Nausea, dizziness, tremors. Acute left leg weakness.  EXAM: CT HEAD WITHOUT CONTRAST  TECHNIQUE: Contiguous axial images were obtained from the base of the skull through the vertex without intravenous contrast.  COMPARISON:  Head MRI and CT 01/20/2014  FINDINGS: Images are mildly degraded by motion. 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 Dilated perivascular space in the left basal ganglia is unchanged. There is no evidence of acute cortical infarct, mass, midline shift, intracranial hemorrhage, or extra-axial fluid collection. Patchy periventricular white matter hypodensity is unchanged and compatible with mild chronic small vessel ischemic disease. Orbits are unremarkable. Mastoid air cells and paranasal sinuses are clear.  IMPRESSION: 1. No evidence of acute intracranial abnormality. 2. Unchanged, remote left occipital lobe infarct and mild chronic small vessel ischemic disease. These results were called by telephone at the time of interpretation on 02/20/2014 at 7:44 PM to  Dr. Leonel Ramsay, who verbally acknowledged these results.   Electronically Signed   By: Logan Bores   On: 02/20/2014 19:44     EKG Interpretation   Date/Time:  Thursday February 20 2014 20:08:58 EDT Ventricular Rate:  99 PR Interval:  149 QRS Duration: 93 QT Interval:  326 QTC Calculation: 418 R Axis:   -7 Text  Interpretation:  Sinus rhythm Probable left atrial enlargement  Abnormal R-wave progression, late transition No significant change since  last tracing Confirmed by Ming Mcmannis  MD, Katesha Eichel ([id number]) on 02/20/2014 11:03:17 PM      MDM   Final diagnoses:  TIA (transient ischemic attack)   Brandi Bryant is a 58 y.o. female here with weakness, shakiness. Came in as code stroke but out of window for TPA. Neuro recommend inpatient stroke workup. Will admit.    Wandra Arthurs, MD 02/20/14 2306

## 2014-02-20 NOTE — Code Documentation (Signed)
58 yo hf transported to [facility] from [facility] for HA, Rt weakness, & Rt side shaking x3days.  Pt also with c/o stomach pain.  Per report pt developed Rt side HA radiating to the frontal area & Rt side weakness as well as Rt side shaking.  Pt has a 1 month hx of Bell's Palsy and wears a patch over the Rt eye.  NIH 14 for BLE weakness, Rt side sensory deficit, Rt facial palsy, & ataxia.  No acute treatment secondary to outside time window.

## 2014-02-20 NOTE — ED Provider Notes (Signed)
CSN: [id number]     Arrival date & time 02/20/14  1759 History   None    Chief Complaint  Patient presents with  . Abdominal Pain  . Chest Pain   (Consider location/radiation/quality/duration/timing/severity/associated sxs/prior Treatment) Patient is a 58 y.o. female presenting with chest pain and neurologic complaint. The history is provided by the patient and a relative. The history is limited by a language barrier. A language interpreter was used.  Chest Pain Associated symptoms: abdominal pain, headache and weakness   Neurologic Problem This is a new problem. The problem has been rapidly worsening. Associated symptoms include abdominal pain and headaches. Pertinent negatives include no chest pain. Associated symptoms comments: HA 10/10 ,acute left leg weakness requiring ambulatory assist, recent nightime abd pain none now..    Past Medical History  Diagnosis Date  . Breast cancer   . HTN (hypertension)   . Diabetes mellitus   . Cholelithiasis   . Hepatic steatosis   . Abdominal pain   . History of breast cancer 06/01/2012  . Arthralgia 08/13/2013  . Chest pain 09/30/2013  . Stroke 2006   Past Surgical History  Procedure Laterality Date  . Tubal ligation    . Breast lumpectomy  2010   Family History  Problem Relation Age of Onset  . Thyroid cancer Sister     thyroid   History  Substance Use Topics  . Smoking status: Never Smoker   . Smokeless tobacco: Not on file  . Alcohol Use: No   OB History   Grav Para Term Preterm Abortions TAB SAB Ect Mult Living   6 6 6       6      Review of Systems  Cardiovascular: Negative for chest pain.  Gastrointestinal: Positive for abdominal pain.  Neurological: Positive for speech difficulty, weakness and headaches.    Allergies  Gadolinium derivatives  Home Medications   Current Outpatient Rx  Name  Route  Sig  Dispense  Refill  . acetaminophen (TYLENOL) 325 MG tablet   Oral   Take 2 tablets (650 mg total) by mouth  every 6 (six) hours as needed for mild pain (or Fever >/= 101).         Marland Kitchen aspirin EC 81 MG tablet   Oral   Take 81 mg by mouth daily.         Marland Kitchen atorvastatin (LIPITOR) 10 MG tablet   Oral   Take 10 mg by mouth daily.         . calcium-vitamin D (OSCAL WITH D) 500-200 MG-UNIT per tablet   Oral   Take 1 tablet by mouth daily with breakfast.         . carbamazepine (TEGRETOL) 100 MG chewable tablet   Oral   Chew 1 tablet (100 mg total) by mouth 3 (three) times daily.   90 tablet   0   . glimepiride (AMARYL) 4 MG tablet   Oral   Take 1 tablet (4 mg total) by mouth 2 (two) times daily.   60 tablet   5   . ibuprofen (ADVIL,MOTRIN) 800 MG tablet   Oral   Take 800 mg by mouth every 8 (eight) hours as needed for pain.         Marland Kitchen letrozole (FEMARA) 2.5 MG tablet   Oral   Take 2.5 mg by mouth daily.         Marland Kitchen lisinopril-hydrochlorothiazide (PRINZIDE,ZESTORETIC) 20-12.5 MG per tablet   Oral   Take 1 tablet by mouth  daily.         . meclizine (ANTIVERT) 25 MG tablet   Oral   Take 1 tablet (25 mg total) by mouth 2 (two) times daily as needed for dizziness.   30 tablet   0   . metFORMIN (GLUCOPHAGE) 850 MG tablet   Oral   Take 0.5-1 tablets (425-850 mg total) by mouth 2 (two) times daily with a meal. Takes 850mg  in the morning and 425mg  in the evening   60 tablet   3   . metoCLOPramide (REGLAN) 10 MG tablet   Oral   Take 1 tablet (10 mg total) by mouth 3 (three) times daily before meals.   90 tablet   3   . Multiple Vitamins-Minerals (MULTIVITAMIN WITH MINERALS) tablet   Oral   Take 1 tablet by mouth daily.         . ondansetron (ZOFRAN) 4 MG tablet   Oral   Take 1 tablet (4 mg total) by mouth every 6 (six) hours.   12 tablet   0   . ondansetron (ZOFRAN) 8 MG tablet   Oral   Take 1 tablet (8 mg total) by mouth every 8 (eight) hours as needed for nausea or vomiting.   20 tablet   0   . oxyCODONE-acetaminophen (ROXICET) 5-325 MG per tablet    Oral   Take 1-2 tablets by mouth every 6 (six) hours as needed for severe pain.   30 tablet   0   . pregabalin (LYRICA) 50 MG capsule   Oral   Take 1 capsule (50 mg total) by mouth 3 (three) times daily.   60 capsule   6    BP 202/105  Pulse 104  Temp(Src) 98 F (36.7 C) (Oral)  Resp 22  SpO2 95% Physical Exam  Nursing note and vitals reviewed. Constitutional: She appears well-developed and well-nourished. She appears distressed.  Neck: Normal range of motion. Neck supple.  Cardiovascular: Normal heart sounds.   Pulmonary/Chest: Breath sounds normal.  Abdominal: Soft. Bowel sounds are normal. There is no tenderness.  Lymphadenopathy:    She has no cervical adenopathy.  Neurological: She is alert. A cranial nerve deficit is present. She exhibits abnormal muscle tone. She displays no Babinski's sign on the right side. She displays no Babinski's sign on the left side.  Right facial droop, speech slurred ,unable to lift left leg off table, nl right leg lift, nl grips, no edema.  Skin: Skin is warm and dry.    ED Course  Procedures (including critical care time) Labs Review Labs Reviewed - No data to display Imaging Review No results found.   MDM   1. Acute focal neurological deficit    Sent as code stroke for severe hypertension, sudden left leg weakness, bells palsy synd for 1 mo , not improving.   Billy Fischer, MD 02/20/14 [phone]

## 2014-02-21 ENCOUNTER — Observation Stay (HOSPITAL_COMMUNITY): Payer: No Typology Code available for payment source

## 2014-02-21 DIAGNOSIS — R519 Headache, unspecified: Secondary | ICD-10-CM | POA: Diagnosis present

## 2014-02-21 DIAGNOSIS — R5383 Other fatigue: Secondary | ICD-10-CM

## 2014-02-21 DIAGNOSIS — IMO0001 Reserved for inherently not codable concepts without codable children: Secondary | ICD-10-CM

## 2014-02-21 DIAGNOSIS — I2510 Atherosclerotic heart disease of native coronary artery without angina pectoris: Secondary | ICD-10-CM

## 2014-02-21 DIAGNOSIS — R5381 Other malaise: Secondary | ICD-10-CM

## 2014-02-21 DIAGNOSIS — G510 Bell's palsy: Secondary | ICD-10-CM

## 2014-02-21 DIAGNOSIS — M2550 Pain in unspecified joint: Secondary | ICD-10-CM

## 2014-02-21 DIAGNOSIS — E1165 Type 2 diabetes mellitus with hyperglycemia: Secondary | ICD-10-CM

## 2014-02-21 DIAGNOSIS — R51 Headache: Secondary | ICD-10-CM

## 2014-02-21 LAB — GLUCOSE, CAPILLARY
GLUCOSE-CAPILLARY: 129 mg/dL — AB (ref 70–99)
Glucose-Capillary: 74 mg/dL (ref 70–99)
Glucose-Capillary: 133 mg/dL — ABNORMAL HIGH (ref 70–99)

## 2014-02-21 LAB — CBC WITH DIFFERENTIAL/PLATELET
BASOS PCT: 0 % (ref 0–1)
Basophils Absolute: 0 10*3/uL (ref 0.0–0.1)
Eosinophils Absolute: 0.3 10*3/uL (ref 0.0–0.7)
Eosinophils Relative: 5 % (ref 0–5)
HCT: 34.7 % — ABNORMAL LOW (ref 36.0–46.0)
Hemoglobin: 12.4 g/dL (ref 12.0–15.0)
LYMPHS PCT: 42 % (ref 12–46)
Lymphs Abs: 2.3 10*3/uL (ref 0.7–4.0)
MCH: 30.7 pg (ref 26.0–34.0)
MCHC: 35.7 g/dL (ref 30.0–36.0)
MCV: 85.9 fL (ref 78.0–100.0)
MONOS PCT: 9 % (ref 3–12)
Monocytes Absolute: 0.5 10*3/uL (ref 0.1–1.0)
NEUTROS ABS: 2.4 10*3/uL (ref 1.7–7.7)
NEUTROS PCT: 44 % (ref 43–77)
Platelets: 191 10*3/uL (ref 150–400)
RBC: 4.04 MIL/uL (ref 3.87–5.11)
RDW: 13.6 % (ref 11.5–15.5)
WBC: 5.5 10*3/uL (ref 4.0–10.5)

## 2014-02-21 LAB — COMPREHENSIVE METABOLIC PANEL
ALK PHOS: 73 U/L (ref 39–117)
ALT: 35 U/L (ref 0–35)
AST: 59 U/L — ABNORMAL HIGH (ref 0–37)
Albumin: 3.1 g/dL — ABNORMAL LOW (ref 3.5–5.2)
BILIRUBIN TOTAL: 0.5 mg/dL (ref 0.3–1.2)
BUN: 9 mg/dL (ref 6–23)
CHLORIDE: 106 meq/L (ref 96–112)
CO2: 21 mEq/L (ref 19–32)
Calcium: 8.8 mg/dL (ref 8.4–10.5)
Creatinine, Ser: 0.57 mg/dL (ref 0.50–1.10)
GFR calc Af Amer: >90 mL/min (ref >90)
GFR calc non Af Amer: >90 mL/min (ref >90)
Glucose, Bld: 147 mg/dL — ABNORMAL HIGH (ref 70–99)
POTASSIUM: 3.7 meq/L (ref 3.7–5.3)
Sodium: 143 mEq/L (ref 137–147)
Total Protein: 6.2 g/dL (ref 6.0–8.3)

## 2014-02-21 LAB — RAPID URINE DRUG SCREEN, HOSP PERFORMED
Amphetamines: NOT DETECTED
BARBITURATES: POSITIVE — AB
Benzodiazepines: NOT DETECTED
Cocaine: NOT DETECTED
Opiates: NOT DETECTED
TETRAHYDROCANNABINOL: NOT DETECTED

## 2014-02-21 MED ORDER — PREGABALIN 50 MG PO CAPS
50.0000 mg | ORAL_CAPSULE | Freq: Three times a day (TID) | ORAL | Status: DC
  Administered 2014-02-21 – 2014-02-23 (×6): 50 mg via ORAL
  Filled 2014-02-21 (×6): qty 1

## 2014-02-21 MED ORDER — ASPIRIN 81 MG PO CHEW
81.0000 mg | CHEWABLE_TABLET | Freq: Every day | ORAL | Status: DC
  Administered 2014-02-21 – 2014-02-23 (×3): 81 mg via ORAL
  Filled 2014-02-21 (×3): qty 1

## 2014-02-21 MED ORDER — ENOXAPARIN SODIUM 40 MG/0.4ML ~~LOC~~ SOLN
40.0000 mg | Freq: Every day | SUBCUTANEOUS | Status: DC
  Administered 2014-02-21 – 2014-02-23 (×3): 40 mg via SUBCUTANEOUS
  Filled 2014-02-21 (×3): qty 0.4

## 2014-02-21 MED ORDER — LETROZOLE 2.5 MG PO TABS
2.5000 mg | ORAL_TABLET | Freq: Every day | ORAL | Status: DC
  Administered 2014-02-21 – 2014-02-23 (×3): 2.5 mg via ORAL
  Filled 2014-02-21 (×3): qty 1

## 2014-02-21 MED ORDER — LISINOPRIL-HYDROCHLOROTHIAZIDE 20-12.5 MG PO TABS: 1.0000 | ORAL_TABLET | Freq: Every day | ORAL | Status: DC

## 2014-02-21 MED ORDER — INSULIN ASPART 100 UNIT/ML ~~LOC~~ SOLN: 0.0000 [IU] | Freq: Every day | SUBCUTANEOUS | Status: DC

## 2014-02-21 MED ORDER — METOCLOPRAMIDE HCL 10 MG PO TABS
10.0000 mg | ORAL_TABLET | Freq: Three times a day (TID) | ORAL | Status: DC
  Administered 2014-02-21 – 2014-02-23 (×8): 10 mg via ORAL
  Filled 2014-02-21 (×10): qty 1

## 2014-02-21 MED ORDER — INSULIN ASPART 100 UNIT/ML ~~LOC~~ SOLN
0.0000 [IU] | Freq: Three times a day (TID) | SUBCUTANEOUS | Status: DC
  Administered 2014-02-22: 3 [IU] via SUBCUTANEOUS
  Administered 2014-02-23: 5 [IU] via SUBCUTANEOUS
  Administered 2014-02-23: 3 [IU] via SUBCUTANEOUS

## 2014-02-21 MED ORDER — KETOROLAC TROMETHAMINE 15 MG/ML IJ SOLN
15.0000 mg | Freq: Four times a day (QID) | INTRAMUSCULAR | Status: DC | PRN
  Administered 2014-02-21: 15 mg via INTRAVENOUS
  Filled 2014-02-21: qty 1

## 2014-02-21 MED ORDER — PROCHLORPERAZINE MALEATE 10 MG PO TABS
10.0000 mg | ORAL_TABLET | Freq: Four times a day (QID) | ORAL | Status: DC | PRN
Start: 2014-02-21 — End: 2014-02-23
  Administered 2014-02-21: 10 mg via ORAL
  Filled 2014-02-21 (×3): qty 1

## 2014-02-21 MED ORDER — CARBAMAZEPINE 100 MG PO CHEW
100.0000 mg | CHEWABLE_TABLET | Freq: Three times a day (TID) | ORAL | Status: DC
  Administered 2014-02-21 – 2014-02-23 (×8): 100 mg via ORAL
  Filled 2014-02-21 (×9): qty 1

## 2014-02-21 MED ORDER — LISINOPRIL 20 MG PO TABS
20.0000 mg | ORAL_TABLET | Freq: Every day | ORAL | Status: DC
  Administered 2014-02-21 – 2014-02-22 (×2): 20 mg via ORAL
  Filled 2014-02-21 (×3): qty 1

## 2014-02-21 MED ORDER — GLIMEPIRIDE 4 MG PO TABS
4.0000 mg | ORAL_TABLET | Freq: Every day | ORAL | Status: DC
  Administered 2014-02-21 – 2014-02-23 (×3): 4 mg via ORAL
  Filled 2014-02-21 (×4): qty 1

## 2014-02-21 MED ORDER — AMITRIPTYLINE HCL 25 MG PO TABS
25.0000 mg | ORAL_TABLET | Freq: Every day | ORAL | Status: DC
  Administered 2014-02-21 – 2014-02-22 (×2): 25 mg via ORAL
  Filled 2014-02-21 (×3): qty 1

## 2014-02-21 MED ORDER — HYDROCHLOROTHIAZIDE 12.5 MG PO CAPS
12.5000 mg | ORAL_CAPSULE | Freq: Every day | ORAL | Status: DC
  Administered 2014-02-21 – 2014-02-23 (×3): 12.5 mg via ORAL
  Filled 2014-02-21 (×3): qty 1

## 2014-02-21 MED ORDER — ACETAMINOPHEN 500 MG PO TABS
1000.0000 mg | ORAL_TABLET | Freq: Four times a day (QID) | ORAL | Status: DC | PRN
  Filled 2014-02-21: qty 2

## 2014-02-21 NOTE — Progress Notes (Signed)
Rehab Admissions Coordinator Note:  Patient was screened by Beuna Bolding L for appropriateness for an Inpatient Acute Rehab Consult.  At this time, we are recommending Inpatient Rehab consult.  Brandi Bryant, PT Rehabilitation Admissions Coordinator [phone]  

## 2014-02-21 NOTE — Procedures (Signed)
ELECTROENCEPHALOGRAM REPORT  Patient: Brandi Bryant       Room #: 0F12 EEG No. ID: [id number] Age: 58 y.o.        Sex: female Referring Physician: Wyline Copas Report Date:  02/21/2014        Interpreting Physician: Anthony Sar  History: [name] is an 58 y.o. female who presented with focal seizure like activity as well as complaint of chest pain and shortness of breath and dizziness.   Indications for study:  Rule out seizure disorder.  Technique: This is an 18 channel routine scalp EEG performed at the bedside with bipolar and monopolar montages arranged in accordance to the international 10/20 system of electrode placement.   Description: This EEG recording was performed during wakefulness. Dominant background activity consisted of 9-10 Hz symmetrical alpha rhythm with good attenuation of alpha activity with eye opening. Photic stimulation was not performed. Hyperventilation was not performed. No epileptiform discharges were recorded. There was no abnormal slowing of cerebral activity.  Interpretation: This is a normal EEG recording during wakefulness. No evidence of an epileptic disorder was demonstrated.   Rush Farmer M.D. [facility] [phone]

## 2014-02-21 NOTE — Progress Notes (Addendum)
CM CONSULT Talked to patient with daughter present about DCP; patient was recently admitted and daughter stated that she was to have [facility] services at discharge but it was never arranged; ( [facility] choices offered - pt/ daughter chose [facility] for HHPT/ OT). Patient goes to the [facility] for primary care and also gets her prescriptions filled there also Patient has her [employer];; Daughter Drucilla stated that sometimes she goes to the [place] urgent care for medical treatment; Daughter is very involved with her care and lives within a close proximity to her; Attending MD at discharge please order HHPT/ OT if in agreement; Aneta Mins [phone]

## 2014-02-21 NOTE — Evaluation (Addendum)
Physical Therapy Evaluation Patient Details Name: Brandi Bryant MRN: [id number] DOB: 21-Apr-1956 Today's Date: 02/21/2014   History of Present Illness  Brandi Bryant is a 58 y.o. female with Past medical history of Hypertension, diabetes, TIA, recent Bell's palsy with trigeminal neuralgia. Recently admitted a month ago with left facial pain and lost ability to speak while in CT scan. MRI of head in March was negative for acute infarct (+ old left occipital infarct).  Clinical Impression  Patient demonstrates deficits in mobility as indicated below. Will need continued skilled PT to address deficits and maximize function. Will see as indicated and progress as tolerated. At this time recommending CIR consult for patient. MRI pending.  OF NOTE: Patient reports that she stopped taking her Tegretol chewable medication 3 days ago (without advisement from MD, as she did not like the way it made her feel), this coincides with symptoms presenting.  Additionally, patient daughter states that she has been trying to get in to follow up with her neurologist, but that they did not see her because her primary care physician did not refer her, and her primary care physician has been unable to see her.      Follow Up Recommendations CIR    Equipment Recommendations   (TBA)    Recommendations for Other Services       Precautions / Restrictions Precautions Precautions: Fall      Mobility  Bed Mobility Overal bed mobility: Needs Assistance Bed Mobility: Supine to Sit;Sit to Supine     Supine to sit: Supervision Sit to supine: Min guard   General bed mobility comments: Min guard for sit<>supine due to pt dizziness and increased posterior lean.  Transfers Overall transfer level: Needs assistance Equipment used: 1 person hand held assist Transfers: Sit to/from Stand Sit to Stand: Min assist;+2 safety/equipment         General transfer comment: Assist for balance. Cues for  technique.  Ambulation/Gait Ambulation/Gait assistance: Min assist;+2 physical assistance Ambulation Distance (Feet): 3 Feet Assistive device: Rolling walker (2 wheeled) Gait Pattern/deviations: Trunk flexed;Narrow base of support (tremors whole body especially LEs when static standing)        Stairs            Wheelchair Mobility    Modified Rankin (Stroke Patients Only)       Balance Overall balance assessment: Needs assistance         Standing balance support: Bilateral upper extremity supported Standing balance-Leahy Scale: Poor Standing balance comment: Pt with significant LOB x1 posteriorly. +2 max assist to regain balance. (Constant tremors with static standing, increased dizziness)                             Pertinent Vitals/Pain HA reported no value given, + dizziness, significant full body tremor when static standing    Home Living Family/patient expects to be discharged to:: Private residence Living Arrangements: Other relatives (sibling) Available Help at Discharge: Family;Available 24 hours/day Type of Home: House Home Access: Stairs to enter Entrance Stairs-Rails: None Entrance Stairs-Number of Steps: 2 Home Layout: One level Home Equipment: None      Prior Function Level of Independence: Independent         Comments: Pt reports that she has been experiencing increased shaking over past 3 days and R LE weakness.  Pt reports she also fell in the shower within past 3 days.  Pt does report discontinuing Tegartol on her own accord due to  side effects (difficulty with speech).       Hand Dominance   Dominant Hand: Right    Extremity/Trunk Assessment   Upper Extremity Assessment: RUE deficits/detail RUE Deficits / Details: slightly weaker than LUE. C/o pain in right shoulder. RUE: Unable to fully assess due to pain (in shoulder)       Lower Extremity Assessment: Generalized weakness;RLE deficits/detail RLE Deficits /  Details: strength 4/5 difficult to assess, reports sesnory changes in BIlateral LEs thighs (3 days)       Communication   Communication: Prefers language other than English ([place]; daughter present during session and interpreting)  Cognition Arousal/Alertness: Awake/alert Behavior During Therapy: Flat affect Overall Cognitive Status: Difficult to assess                      General Comments General comments (skin integrity, edema, etc.): daughter present to translate for therapies.  Patient with increased dizziness and tremors with static standing. strength functional on assessment but during activity easily fatigues and unable to stand without increased assist.      Exercises        Assessment/Plan    PT Assessment Patient needs continued PT services  PT Diagnosis Difficulty walking;Acute pain   PT Problem List Decreased strength;Decreased activity tolerance;Decreased balance;Decreased mobility;Decreased knowledge of use of DME;Pain  PT Treatment Interventions DME instruction;Gait training;Stair training;Functional mobility training;Therapeutic activities;Balance training;Neuromuscular re-education;Cognitive remediation;Patient/family education   PT Goals (Current goals can be found in the Care Plan section) Acute Rehab PT Goals Patient Stated Goal: did not state PT Goal Formulation: With patient/family Time For Goal Achievement: 01/24/14 Potential to Achieve Goals: Fair    Frequency Min 3X/week   Barriers to discharge        Co-evaluation PT/OT/SLP Co-Evaluation/Treatment: Yes Reason for Co-Treatment: Necessary to address cognition/behavior during functional activity;Other (comment) (limited availability of family to translate) PT goals addressed during session: Mobility/safety with mobility;Balance         End of Session Equipment Utilized During Treatment: Gait belt Activity Tolerance: Patient tolerated treatment well Patient left: in bed;with call  bell/phone within reach;with bed alarm set Nurse Communication: Mobility status    Functional Assessment Tool Used: clinical judgement Functional Limitation: Mobility: Walking and moving around Mobility: Walking and Moving Around Current Status (T0160): At least 40 percent but less than 60 percent impaired, limited or restricted Mobility: Walking and Moving Around Goal Status [phone]): At least 1 percent but less than 20 percent impaired, limited or restricted    Time: 0811-0837 PT Time Calculation (min): 26 min   Charges:   PT Evaluation $Initial PT Evaluation Tier I: 1 Procedure PT Treatments $Therapeutic Activity: 8-22 mins   PT G Codes:   Functional Assessment Tool Used: clinical judgement Functional Limitation: Mobility: Walking and moving around    Duncan Dull 02/21/2014, 10:52 AM Alben Deeds, PT DPT  [phone]

## 2014-02-21 NOTE — H&P (Signed)
[facility] History and Physical  Patient: Brandi Bryant  FGH:[id number]  DOB: 1955-12-03  DOS: the patient was seen and examined on 02/20/2014 PCP: [name], MD  Chief Complaint: Headache  HPI: Brandi Bryant is a 58 y.o. female with Past medical history of Hypertension, diabetes, TIA, recent Bell's palsy with trigeminal neuralgia. The patient presented with complaints of headache which is located on the right side on the frontal region. The pain started last night which was associated with abdominal pain one episode of loose motion without any blood and nausea and one episode of vomiting without any blood. She denies any fever but felt chills she complains of On and off abdominal pain, burning urination yesterday, some sore throat, photophobia, phonophobia, shortness of breath without any cough. She claims that she stop her medication yesterday as she felt that it was causing her abdominal pain. She denies any focal neurological deficit, fall, trauma, injury, neck stiffness. She initially presented with tremors which he felt was due to severe pain. The history was obtained with the help of interpreter since the patient does not speak [place]..  The patient is coming from home. And at her baseline independent for most of her  ADL.  Review of Systems: as mentioned in the history of present illness.  A Comprehensive review of the other systems is negative.  Past Medical History  Diagnosis Date  . Breast cancer   . HTN (hypertension)   . Diabetes mellitus   . Cholelithiasis   . Hepatic steatosis   . Abdominal pain   . History of breast cancer 06/01/2012  . Arthralgia 08/13/2013  . Chest pain 09/30/2013  . Stroke 2006   Past Surgical History  Procedure Laterality Date  . Tubal ligation    . Breast lumpectomy  2010   Social History:  reports that she has never smoked. She does not have any smokeless tobacco history on file. She reports that she does not  drink alcohol or use illicit drugs.  Allergies  Allergen Reactions  . Gadolinium Derivatives     Code: VOM, Desc: Pt began vomiting 45 sec after MRI contrast injection of Multihance, Onset Date: [id number]      Family History  Problem Relation Age of Onset  . Thyroid cancer Sister     thyroid    Prior to Admission medications   Medication Sig Start Date End Date Taking? Authorizing Provider  acetaminophen (TYLENOL) 500 MG tablet Take 1,000 mg by mouth 5 (five) times daily as needed (pain).   Yes Historical Provider, MD  aspirin 81 MG chewable tablet Chew by mouth daily.   Yes Historical Provider, MD  B Complex-C (B-COMPLEX WITH VITAMIN C) tablet Take 1 tablet by mouth daily.   Yes Historical Provider, MD  calcium-vitamin D (OSCAL WITH D) 500-200 MG-UNIT per tablet Take 1 tablet by mouth daily with breakfast.   Yes Historical Provider, MD  carbamazepine (TEGRETOL) 100 MG chewable tablet Chew 1 tablet (100 mg total) by mouth 3 (three) times daily. 01/22/14  Yes Delfina Redwood, MD  glimepiride (AMARYL) 4 MG tablet Take 4 mg by mouth daily with breakfast.   Yes Historical Provider, MD  ibuprofen (ADVIL,MOTRIN) 200 MG tablet Take 800 mg by mouth every 4 (four) hours as needed (pain).   Yes Historical Provider, MD  letrozole (FEMARA) 2.5 MG tablet Take 2.5 mg by mouth daily.   Yes Historical Provider, MD  lisinopril-hydrochlorothiazide (PRINZIDE,ZESTORETIC) 20-12.5 MG per tablet Take 1 tablet by mouth daily.   Yes Historical  Provider, MD  metFORMIN (GLUCOPHAGE) 850 MG tablet Take 0.5-1 tablets (425-850 mg total) by mouth 2 (two) times daily with a meal. Takes 850mg  in the morning and 425mg  in the evening 02/12/14  Yes Robbie Lis, MD  metoCLOPramide (REGLAN) 10 MG tablet Take 1 tablet (10 mg total) by mouth 3 (three) times daily before meals. 02/12/14  Yes Robbie Lis, MD  pregabalin (LYRICA) 50 MG capsule Take 1 capsule (50 mg total) by mouth 3 (three) times daily. 01/30/14  Yes Dorothy Spark, MD    Physical Exam: Filed Vitals:   02/20/14 2200 02/20/14 2215 02/20/14 2250 02/20/14 2331  BP: 129/59 124/68 124/76 142/66  Pulse: 86 89  86  Temp:   98.2 F (36.8 C) 97.9 F (36.6 C)  TempSrc:   Oral Oral  Resp: 20 22 17 18   Height:    5\' 2"  (1.575 m)  Weight:    66.86 kg (147 lb 6.4 oz)  SpO2: 95% 97% 95% 98%    General: Alert, Awake and Oriented to Time, Place and Person. Appear in mild distress The patient was requesting eye patch and constantly covering her right eye. But in between she was able to open her on and having a conversation with her son with lights on Eyes: PERRL, Extraocular muscle movement present, no nystagmus no redness, no Pain on eye movement ENT: Oral Mucosa clear moist. Neck: no JVD Cardiovascular: S1 and S2 Present, no Murmur, Peripheral Pulses Present Respiratory: Bilateral Air entry equal and Decreased, Clear to Auscultation,  no Crackles,no wheezes Abdomen: Bowel Sound Present, Soft and Non tender Skin: no Rash Extremities: no Pedal edema, no calf tenderness Neurologic: Mental status As above, Cranial Nerves Pupils are reactive, gag present, facial muscle weakness on the left from recent Bell's palsy, Motor strength Bilaterally equal strength, Sensation Present to light touch, reflexes Present, babinski Negative, Cerebellar test Normal finger-nose-finger.  Labs on Admission:  CBC:  Recent Labs Lab 02/20/14 1951  WBC 8.1  NEUTROABS 3.7  HGB 14.1  HCT 39.8  MCV 86.5  PLT 224    CMP     Component Value Date/Time   NA 142 02/20/2014 1951   NA 139 09/30/2013 1033   K 4.2 02/20/2014 1951   K 4.2 09/30/2013 1033   CL 103 02/20/2014 1951   CL 107 01/28/2013 0854   CO2 19 02/20/2014 1951   CO2 22 09/30/2013 1033   GLUCOSE 169* 02/20/2014 1951   GLUCOSE 256* 09/30/2013 1033   GLUCOSE 243* 01/28/2013 0854   BUN 11 02/20/2014 1951   BUN 13.0 09/30/2013 1033   CREATININE 0.63 02/20/2014 1951   CREATININE 0.94 01/24/2014 1510   CREATININE 0.8  09/30/2013 1033   CALCIUM 9.7 02/20/2014 1951   CALCIUM 9.9 09/30/2013 1033   PROT 7.5 02/20/2014 1951   PROT 7.5 09/30/2013 1033   ALBUMIN 3.8 02/20/2014 1951   ALBUMIN 3.7 09/30/2013 1033   AST 80* 02/20/2014 1951   AST 50* 09/30/2013 1033   ALT 45* 02/20/2014 1951   ALT 45 09/30/2013 1033   ALKPHOS 98 02/20/2014 1951   ALKPHOS 108 09/30/2013 1033   BILITOT 0.4 02/20/2014 1951   BILITOT 0.89 09/30/2013 1033   GFRNONAA >90 02/20/2014 1951   GFRNONAA 68 01/24/2014 1510   GFRAA >90 02/20/2014 1951   GFRAA 78 01/24/2014 1510    No results found for this basename: LIPASE, AMYLASE,  in the last 168 hours No results found for this basename: AMMONIA,  in the last  168 hours  No results found for this basename: CKTOTAL, CKMB, CKMBINDEX, TROPONINI,  in the last 168 hours BNP (last 3 results) No results found for this basename: PROBNP,  in the last 8760 hours  Radiological Exams on Admission: Ct Head (brain) Wo Contrast  02/20/2014   CLINICAL DATA:  Code stroke. Nausea, dizziness, tremors. Acute left leg weakness.  EXAM: CT HEAD WITHOUT CONTRAST  TECHNIQUE: Contiguous axial images were obtained from the base of the skull through the vertex without intravenous contrast.  COMPARISON:  Head MRI and CT 01/20/2014  FINDINGS: Images are mildly degraded by motion. 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 Dilated perivascular space in the left basal ganglia is unchanged. There is no evidence of acute cortical infarct, mass, midline shift, intracranial hemorrhage, or extra-axial fluid collection. Patchy periventricular white matter hypodensity is unchanged and compatible with mild chronic small vessel ischemic disease. Orbits are unremarkable. Mastoid air cells and paranasal sinuses are clear.  IMPRESSION: 1. No evidence of acute intracranial abnormality. 2. Unchanged, remote left occipital lobe infarct and mild chronic small vessel ischemic disease. These results were called by telephone at the time of interpretation on  02/20/2014 at 7:44 PM to Dr. Leonel Ramsay, who verbally acknowledged these results.   Electronically Signed   By: Logan Bores   On: 02/20/2014 19:44    EKG: Independently reviewed. normal sinus rhythm, nonspecific ST and T waves changes.  Assessment/Plan Principal Problem:   Intractable headache Active Problems:   HTN (hypertension)   Diabetes mellitus   Bell's palsy   Trigeminal neuralgia   TIA (transient ischemic attack)   1. Intractable headache The patient is presenting with complaints of right-sided headache with Subjective photophobia. She also complains of weakness and tremors. Initial workup in the ED including EKG initial lab work as well as CT of the head does not show any acute abnormality. Neurology Has evaluated the patient and Patient was out of window period. At present we will obtain MRI of the brain as well as an EEG.. Serial neuro checks, telemetry on observation, For her headache we will start her on Fioricet, if this does not improve her symptoms she may require Depakote 500 mg every 12 hours for headache.  2.Hypertension Patient presented with accelerated hypertension blood pressure were 673 systolic at present her blood pressure has dropped down to 120 without any treatment. We'll continue to monitor and resume her blood pressure medication from the next day needed  3.Diabetes mellitus Continue her on sliding scale  4.bells palsy and Trigeminal neuralgia Continue Lyrica Speech evaluation  Consults: Neurology, appreciate their input  DVT Prophylaxis: subcutaneous Heparin Nutrition: N.p.o. Pending stroke evaluation  Code Status: Full  Family Communication: Family was present at bedside, opportunity was given to ask question and all questions were answered satisfactorily at the time of interview. Disposition: Admitted to observation in telemetry unit.  Author: Berle Mull, MD [facility] Pager: [phone]  If 7PM-7AM, please contact  night-coverage [email or web address] Password [email or web address]

## 2014-02-21 NOTE — Evaluation (Signed)
Occupational Therapy Evaluation Patient Details Name: Brandi Bryant MRN: [id number] DOB: Mar 29, 1956 Today's Date: 02/21/2014    History of Present Illness Fara Worthy is a 58 y.o. female with Past medical history of Hypertension, diabetes, TIA, recent Bell's palsy with trigeminal neuralgia.The patient presented with complaints of headache which is located on the right side on the frontal region.  Recently admitted a month ago with left facial pain and lost ability to speak while in CT scan. MRI of head in March was negative for acute infarct (+ old left occipital infarct).  Pt has required varying levels of assist at home for past month. Pt does report discontinuing Tegartol on her own accord due to side effects (difficulty with speech).     Clinical Impression   Pt admitted with c/o HA on right side of frontal region.  Pt limited by dizziness today.  Will continue to follow acutely in order to address below problem list. Recommending CIR to further progress rehab and maximize independence with ADLs prior to return home.    Follow Up Recommendations  CIR;Supervision/Assistance - 24 hour    Equipment Recommendations   (TBD)    Recommendations for Other Services Rehab consult     Precautions / Restrictions Precautions Precautions: Fall      Mobility Bed Mobility Overal bed mobility: Needs Assistance Bed Mobility: Supine to Sit;Sit to Supine     Supine to sit: Supervision Sit to supine: Min guard   General bed mobility comments: Min guard for sit<>supine due to pt dizziness and increased posterior lean.  Transfers Overall transfer level: Needs assistance Equipment used: 1 person hand held assist Transfers: Sit to/from Stand Sit to Stand: Min assist;+2 safety/equipment         General transfer comment: Assist for balance. Cues for technique.    Balance Overall balance assessment: Needs assistance         Standing balance support: Bilateral upper  extremity supported Standing balance-Leahy Scale: Poor Standing balance comment: Pt with significant LOB x1 posteriorly. +2 max assist to regain balance.                            ADL Overall ADL's : Needs assistance/impaired Eating/Feeding: Modified independent   Grooming: Modified independent;Sitting (putting hair up in ponytail using bil UEs)                   Toilet Transfer: +2 for physical assistance;Minimal assistance;RW (sit<>stand from bed) Toilet Transfer Details (indicate cue type and reason): Pt stood at bedside and took 2 steps forward/back with +2 assist and RW.         Functional mobility during ADLs: +2 for physical assistance;Minimal assistance;Rolling walker General ADL Comments: Pt reported dizzienss with sitting EOB and standing.  While sitting EOB, OT noted minor RUE tremors.  When standing with RW for bil UE support, she began demonstrating increased tremors in R UE/LE.  After taking 2 steps forward/back with RW and +2 assist, pt experienced LOB and required +2 max assist for steadying. PT/OT assisted with return to bed.   Pt describes dizziness as a spinning sensation but unable to determine if spinning was occuring in certain direction.  Pt with right eye patch on during entire session.  C/o photophobia but able to tolerate session with window blind raised.      Vision                 Additional Comments: Pt c/o photophobia.  Able to tolerate window blind open but would not take off right eye patch.  Pt c/o dizziness (spinning) with sitting and standing.  Too dark in room to assess if any presence of nystagmus.  Will continue to assess.    Perception     Praxis      Pertinent Vitals/Pain C/o right shoulder pain but did not rate.     Hand Dominance Right   Extremity/Trunk Assessment Upper Extremity Assessment Upper Extremity Assessment: RUE deficits/detail RUE Deficits / Details: slightly weaker than LUE. C/o pain in right  shoulder. RUE: Unable to fully assess due to pain (in shoulder) RUE Coordination: decreased fine motor;decreased gross motor (ataxic movements)           Communication Communication Communication: Prefers language other than English ([place]; daughter present during session and interpreting)   Cognition Arousal/Alertness: Awake/alert Behavior During Therapy: Flat affect Overall Cognitive Status: Difficult to assess                     General Comments       Exercises       Shoulder Instructions      Home Living Family/patient expects to be discharged to:: Private residence Living Arrangements: Other relatives (sibling) Available Help at Discharge: Family;Available 24 hours/day Type of Home: House Home Access: Stairs to enter [employer] of Steps: 2 Entrance Stairs-Rails: None Home Layout: One level     Bathroom Shower/Tub: Teacher, early years/pre: Standard     Home Equipment: None          Prior Functioning/Environment Level of Independence: Independent        Comments: Pt reports that she has been experiencing increased shaking over past 3 days and R LE weakness.  Pt reports she also fell in the shower within past 3 days.  Pt does report discontinuing Tegartol on her own accord due to side effects (difficulty with speech).      OT Diagnosis: Generalized weakness;Disturbance of vision;Acute pain   OT Problem List: Decreased strength;Decreased activity tolerance;Impaired balance (sitting and/or standing);Impaired vision/perception;Decreased coordination;Decreased knowledge of use of DME or AE;Decreased knowledge of precautions;Pain;Impaired UE functional use   OT Treatment/Interventions: Self-care/ADL training;DME and/or AE instruction;Therapeutic activities;Patient/family education;Balance training;Visual/perceptual remediation/compensation    OT Goals(Current goals can be found in the care plan section) Acute Rehab OT  Goals Patient Stated Goal: did not state OT Goal Formulation: With patient/family Time For Goal Achievement: 03/07/14 Potential to Achieve Goals: Good  OT Frequency: Min 2X/week   Barriers to D/C:            Co-evaluation              End of Session Equipment Utilized During Treatment: Gait belt;Rolling walker Nurse Communication: Mobility status  Activity Tolerance: Patient limited by pain (dizzy) Patient left: in bed;with call bell/phone within reach;with bed alarm set;with family/visitor present   Time: 4235-3614 OT Time Calculation (min): 26 min Charges:  OT General Charges $OT Visit: 1 Procedure OT Evaluation $Initial OT Evaluation Tier I: 1 Procedure OT Treatments $Self Care/Home Management : 8-22 mins G-Codes: OT G-codes **NOT FOR INPATIENT CLASS** Functional Assessment Tool Used: clinical judgment Functional Limitation: Self care Self Care Current Status (E3154): At least 40 percent but less than 60 percent impaired, limited or restricted Self Care Goal Status (M0867): At least 1 percent but less than 20 percent impaired, limited or restricted  Darrol Jump 02/21/2014, 10:29 AM  02/21/2014 Darrol Jump OTR/L Pager [phone] Office  [phone]     

## 2014-02-21 NOTE — Progress Notes (Signed)
[facility] PROGRESS NOTE  Brandi Bryant IOX:[id number] DOB: 1955-12-06 DOA: 02/20/2014 PCP: [name], MD  Assessment/Plan: 1. Intractable headache  The patient presented with complaints of right-sided headache with photophobia. and weakness and tremors. Initial workup in the ED including EKG initial lab work as well as CT of the head does not show any acute abnormality. MRI and EEG results pending. Neuro following. Consider Ketoralac PRN. May also consider a trial of low dose amitriptyline QHS.  2.Hypertension  Currently stable.  3.Diabetes mellitus  Continue her on sliding scale   4.bells palsy and Trigeminal neuralgia  Continue Lyrica  Speech evaluation PT/OT recs for CIR   Code Status: Full Family Communication: Pt in room (indicate person spoken with, relationship, and if by phone, the number) Disposition Plan: Pending CIR eval   Consultants:  Neurology  Procedures:    Antibiotics:    HPI/Subjective: No acute events noted overnight  Objective: Filed Vitals:   02/20/14 2331 02/21/14 0144 02/21/14 0522 02/21/14 1000  BP: 142/66 119/62 119/67 128/71  Pulse: 86 71 76 69  Temp: 97.9 F (36.6 C) 97.7 F (36.5 C) 97.7 F (36.5 C) 98.1 F (36.7 C)  TempSrc: Oral Oral Oral Oral  Resp: 18 18 18 18   Height: 5\' 2"  (1.575 m)     Weight: 66.86 kg (147 lb 6.4 oz)     SpO2: 98% 98% 98% 98%    Intake/Output Summary (Last 24 hours) at 02/21/14 1440 Last data filed at 02/21/14 1234  Gross per 24 hour  Intake    120 ml  Output      0 ml  Net    120 ml   Filed Weights   02/20/14 2331  Weight: 66.86 kg (147 lb 6.4 oz)    Exam:   General:  Awake, appears to be in acute pain  Cardiovascular: regular, s1, s2  Respiratory: normal resp effort, no wheezing  Abdomen: soft, nondistended  Musculoskeletal: perfused, no clubbing   Data Reviewed: Basic Metabolic Panel:  Recent Labs Lab 02/20/14 1951 02/21/14 0610  NA 142 143  K 4.2 3.7   CL 103 106  CO2 19 21  GLUCOSE 169* 147*  BUN 11 9  CREATININE 0.63 0.57  CALCIUM 9.7 8.8   Liver Function Tests:  Recent Labs Lab 02/20/14 1951 02/21/14 0610  AST 80* 59*  ALT 45* 35  ALKPHOS 98 73  BILITOT 0.4 0.5  PROT 7.5 6.2  ALBUMIN 3.8 3.1*   No results found for this basename: LIPASE, AMYLASE,  in the last 168 hours No results found for this basename: AMMONIA,  in the last 168 hours CBC:  Recent Labs Lab 02/20/14 1951 02/21/14 0610  WBC 8.1 5.5  NEUTROABS 3.7 2.4  HGB 14.1 12.4  HCT 39.8 34.7*  MCV 86.5 85.9  PLT 224 191   Cardiac Enzymes: No results found for this basename: CKTOTAL, CKMB, CKMBINDEX, TROPONINI,  in the last 168 hours BNP (last 3 results) No results found for this basename: PROBNP,  in the last 8760 hours CBG:  Recent Labs Lab 02/20/14 2120 02/21/14 0713 02/21/14 1204  GLUCAP 127* 129* 74    No results found for this or any previous visit (from the past 240 hour(s)).   Studies: Ct Head (brain) Wo Contrast  02/20/2014   CLINICAL DATA:  Code stroke. Nausea, dizziness, tremors. Acute left leg weakness.  EXAM: CT HEAD WITHOUT CONTRAST  TECHNIQUE: Contiguous axial images were obtained from the base of the skull through the vertex without intravenous  contrast.  COMPARISON:  Head MRI and CT 01/20/2014  FINDINGS: Images are mildly degraded by motion. 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 Dilated perivascular space in the left basal ganglia is unchanged. There is no evidence of acute cortical infarct, mass, midline shift, intracranial hemorrhage, or extra-axial fluid collection. Patchy periventricular white matter hypodensity is unchanged and compatible with mild chronic small vessel ischemic disease. Orbits are unremarkable. Mastoid air cells and paranasal sinuses are clear.  IMPRESSION: 1. No evidence of acute intracranial abnormality. 2. Unchanged, remote left occipital lobe infarct and mild chronic small vessel ischemic disease. These  results were called by telephone at the time of interpretation on 02/20/2014 at 7:44 PM to Dr. Leonel Ramsay, who verbally acknowledged these results.   Electronically Signed   By: Logan Bores   On: 02/20/2014 19:44    Scheduled Meds: . aspirin  81 mg Oral Daily  . carbamazepine  100 mg Oral TID  . enoxaparin (LOVENOX) injection  40 mg Subcutaneous Daily  . glimepiride  4 mg Oral Q breakfast  . lisinopril  20 mg Oral Daily   And  . hydrochlorothiazide  12.5 mg Oral Daily  . insulin aspart  0-15 Units Subcutaneous TID WC  . insulin aspart  0-5 Units Subcutaneous QHS  . letrozole  2.5 mg Oral Daily  . metoCLOPramide  10 mg Oral TID AC  . pregabalin  50 mg Oral TID   Continuous Infusions:   Principal Problem:   Intractable headache Active Problems:   HTN (hypertension)   Diabetes mellitus   Bell's palsy   Trigeminal neuralgia   TIA (transient ischemic attack)  Time spent: 96min  Hollyanne Schloesser, [facility] Pager [phone]. If 7PM-7AM, please contact night-coverage at [email or web address], password [facility] 02/21/2014, 2:40 PM  LOS: 1 day

## 2014-02-21 NOTE — Progress Notes (Signed)
EEG completed; results pending.    

## 2014-02-21 NOTE — Progress Notes (Signed)
Subjective: Patient has no new complaints. She she's had no recurrence of seizure like activity.  Objective: Current vital signs: BP 104/62  Pulse 65  Temp(Src) 99.2 F (37.3 C) (Oral)  Resp 18  Ht 5\' 2"  (1.575 m)  Wt 66.86 kg (147 lb 6.4 oz)  BMI 26.95 kg/m2  SpO2 98%  Neurologic Exam: Alert and in no acute distress. Communicated by way of an interpreter (daughter).  EEG today was normal. MRI showed remote right occipital infarction with no acute abnormalities noted.  Medications: I have reviewed the patient's current medications.  Assessment/Plan: 58 year old lady presenting with multiple complaints including seizure-like activity. Psychophysiologic factors for strongly suspected. EEG was normal and MRI showed no signs of acute intracranial abnormality. Patient has no objective indications of an acute central nervous system abnormality as well as no supporting evidence of seizure disorder. There is no indication for use of antiepileptic drug.  No further neurological intervention is indicated at this point. I was off on her care but remain available for reevaluation if indicated.  C.R. Nicole Kindred, MD [facility] [phone]  02/21/2014  6:44 PM

## 2014-02-21 NOTE — Progress Notes (Signed)
UR completed 

## 2014-02-22 DIAGNOSIS — R29810 Facial weakness: Secondary | ICD-10-CM

## 2014-02-22 DIAGNOSIS — I10 Essential (primary) hypertension: Secondary | ICD-10-CM

## 2014-02-22 DIAGNOSIS — E119 Type 2 diabetes mellitus without complications: Secondary | ICD-10-CM

## 2014-02-22 LAB — GLUCOSE, CAPILLARY
GLUCOSE-CAPILLARY: 106 mg/dL — AB (ref 70–99)
GLUCOSE-CAPILLARY: 168 mg/dL — AB (ref 70–99)
Glucose-Capillary: 100 mg/dL — ABNORMAL HIGH (ref 70–99)
Glucose-Capillary: 145 mg/dL — ABNORMAL HIGH (ref 70–99)

## 2014-02-22 MED ORDER — MORPHINE SULFATE 2 MG/ML IJ SOLN: 2.0000 mg | Freq: Once | INTRAMUSCULAR | Status: DC

## 2014-02-22 MED ORDER — ALUM & MAG HYDROXIDE-SIMETH 200-200-20 MG/5ML PO SUSP
15.0000 mL | Freq: Four times a day (QID) | ORAL | Status: DC | PRN
  Administered 2014-02-22: 15 mL via ORAL
  Filled 2014-02-22: qty 30

## 2014-02-22 MED ORDER — FLUOXETINE HCL 10 MG PO CAPS
10.0000 mg | ORAL_CAPSULE | Freq: Every day | ORAL | Status: DC
  Administered 2014-02-22 – 2014-02-23 (×2): 10 mg via ORAL
  Filled 2014-02-22 (×2): qty 1

## 2014-02-22 NOTE — Progress Notes (Addendum)
[facility] PROGRESS NOTE  Brandi Bryant ZYS:[id number] DOB: December 17, 1955 DOA: 02/20/2014 PCP: Brandi Marek, MD  Assessment/Plan: 1. Intractable headache  -The patient presented with complaints of right-sided headache with photophobia. and weakness and tremors. Initial workup in the ED including EKG initial lab work as well as CT of the head does not show any acute abnormality. MRI and EEG unremarkable. Neuro recs noted. Marland KitchenPsychophysiologic factors suspected. Given trial of Ketoralac PRN and low dose amitriptyline QHS.   -Discussed case with family at bedside. Pt reportedly "worries a lot" per son, and fears loneliness since her diagnosis of Bell's palsy. Family is concerned about depression. Will add SSRI  2.Hypertension  -Currently stable.  3.Diabetes mellitus  -Continue her on sliding scale   4.bells palsy and Trigeminal neuralgia  -Continue Lyrica  -Speech evaluation -PT/OT recs for CIR  5.Imbalance -Noted by staff and PT this AM of unsteady gait and impaired balance. -Per PT, "not functioning well enough to discharge home alone. Continue to recommend CIR based on amount of physical assist pt is currently requiring" -Await CIR input  Code Status: Full Family Communication: Pt in room (indicate person spoken with, relationship, and if by phone, the number) Disposition Plan: Pending CIR eval   Consultants:  Neurology  Procedures:    Antibiotics:    HPI/Subjective: No acute events. Pt intermittently tearful and happy when family is present.  Objective: Filed Vitals:   02/21/14 1812 02/21/14 2141 02/22/14 0110 02/22/14 0545  BP: 104/62 102/54 121/64 111/53  Pulse: 65 72 83 63  Temp: 99.2 F (37.3 C) 99.2 F (37.3 C) 98.9 F (37.2 C) 98 F (36.7 C)  TempSrc: Oral Oral Oral Oral  Resp: 18 18 18 18   Height:      Weight:      SpO2: 98% 98% 97% 97%    Intake/Output Summary (Last 24 hours) at 02/22/14 0852 Last data filed at 02/21/14 1700   Gross per 24 hour  Intake 1598.33 ml  Output      0 ml  Net 1598.33 ml   Filed Weights   02/20/14 2331  Weight: 66.86 kg (147 lb 6.4 oz)    Exam:   General:  Awake, appears to be in acute pain  Cardiovascular: regular, s1, s2  Respiratory: normal resp effort, no wheezing  Abdomen: soft, nondistended  Musculoskeletal: perfused, no clubbing   Data Reviewed: Basic Metabolic Panel:  Recent Labs Lab 02/20/14 1951 02/21/14 0610  NA 142 143  K 4.2 3.7  CL 103 106  CO2 19 21  GLUCOSE 169* 147*  BUN 11 9  CREATININE 0.63 0.57  CALCIUM 9.7 8.8   Liver Function Tests:  Recent Labs Lab 02/20/14 1951 02/21/14 0610  AST 80* 59*  ALT 45* 35  ALKPHOS 98 73  BILITOT 0.4 0.5  PROT 7.5 6.2  ALBUMIN 3.8 3.1*   No results found for this basename: LIPASE, AMYLASE,  in the last 168 hours No results found for this basename: AMMONIA,  in the last 168 hours CBC:  Recent Labs Lab 02/20/14 1951 02/21/14 0610  WBC 8.1 5.5  NEUTROABS 3.7 2.4  HGB 14.1 12.4  HCT 39.8 34.7*  MCV 86.5 85.9  PLT 224 191   Cardiac Enzymes: No results found for this basename: CKTOTAL, CKMB, CKMBINDEX, TROPONINI,  in the last 168 hours BNP (last 3 results) No results found for this basename: PROBNP,  in the last 8760 hours CBG:  Recent Labs Lab 02/20/14 2120 02/21/14 0713 02/21/14 1204 02/21/14 2127 02/22/14 9323  GLUCAP 127* 129* 74 133* 106*    No results found for this or any previous visit (from the past 240 hour(s)).   Studies: Ct Head (brain) Wo Contrast  02/20/2014   CLINICAL DATA:  Code stroke. Nausea, dizziness, tremors. Acute left leg weakness.  EXAM: CT HEAD WITHOUT CONTRAST  TECHNIQUE: Contiguous axial images were obtained from the base of the skull through the vertex without intravenous contrast.  COMPARISON:  Head MRI and CT 01/20/2014  FINDINGS: Images are mildly degraded by motion. 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 Dilated perivascular space in the left basal  ganglia is unchanged. There is no evidence of acute cortical infarct, mass, midline shift, intracranial hemorrhage, or extra-axial fluid collection. Patchy periventricular white matter hypodensity is unchanged and compatible with mild chronic small vessel ischemic disease. Orbits are unremarkable. Mastoid air cells and paranasal sinuses are clear.  IMPRESSION: 1. No evidence of acute intracranial abnormality. 2. Unchanged, remote left occipital lobe infarct and mild chronic small vessel ischemic disease. These results were called by telephone at the time of interpretation on 02/20/2014 at 7:44 PM to Dr. Leonel Ramsay, who verbally acknowledged these results.   Electronically Signed   By: Logan Bores   On: 02/20/2014 19:44   Mr Brain Wo Contrast  02/21/2014   CLINICAL DATA:  Right-sided numbness.  Double vision.  EXAM: MRI HEAD WITHOUT CONTRAST  TECHNIQUE: Multiplanar, multiecho pulse sequences of the brain and surrounding structures were obtained without intravenous contrast.  COMPARISON:  CT HEAD W/O CM dated 02/20/2014; MR C SPINE W/O CM dated 01/21/2014; MR HEAD W/O CM dated 01/20/2014  FINDINGS: The patient was unable to remain motionless for the exam. Small or subtle lesions could be overlooked. In addition, there is susceptibility artifact related to braces or dental work which obscures much of the frontal lobes on diffusion-weighted imaging.  No evidence for acute infarction, hemorrhage, mass lesion, hydrocephalus, or extra-axial fluid. Normal for age cerebral volume. Old left occipital infarct without significant hemorrhage or mineralization. Mild to moderate small vessel disease. Negative orbits, sinuses, and mastoids. The major intracranial flow voids are preserved.  Compared with priors, a similar appearance is noted.  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   Electronically Signed   By: [name] M.D.   On: 02/21/2014 17:59    Scheduled Meds: . amitriptyline  25  mg Oral QHS  . aspirin  81 mg Oral Daily  . carbamazepine  100 mg Oral TID  . enoxaparin (LOVENOX) injection  40 mg Subcutaneous Daily  . glimepiride  4 mg Oral Q breakfast  . lisinopril  20 mg Oral Daily   And  . hydrochlorothiazide  12.5 mg Oral Daily  . insulin aspart  0-15 Units Subcutaneous TID WC  . insulin aspart  0-5 Units Subcutaneous QHS  . letrozole  2.5 mg Oral Daily  . metoCLOPramide  10 mg Oral TID AC  .  morphine injection  2 mg Intravenous Once  . pregabalin  50 mg Oral TID   Continuous Infusions:   Principal Problem:   Intractable headache Active Problems:   HTN (hypertension)   Diabetes mellitus   Bell's palsy   Trigeminal neuralgia   TIA (transient ischemic attack)  Time spent: 30min  Yarethzy Croak, [facility] Pager [phone]. If 7PM-7AM, please contact night-coverage at [email or web address], password [facility] 02/22/2014, 8:52 AM  LOS: 2 days

## 2014-02-22 NOTE — Progress Notes (Addendum)
Occupational Therapy Treatment Patient Details Name: Brandi Bryant MRN: [id number] DOB: 06-05-1956 Today's Date: 02/22/2014    History of present illness Brandi Bryant is a 58 y.o. female with Past medical history of Hypertension, diabetes, TIA, recent Bell's palsy with trigeminal neuralgia. Recently admitted a month ago with left facial pain and lost ability to speak while in CT scan. MRI of head in March was negative for acute infarct (+ old left occipital infarct).   OT comments  Pt improved today, but requires mod A for LB ADLs due to impaired balance (LE bouncing and UE tremors when standing).  She was able to ambulate in the room with min A using RW, but is not functioning well enough to discharge home alone.  Continue to recommend CIR based on amount of physical assist pt is currently requiring; however, anticipate she will improve fairly quickly.  Pt denies pain this date  ui   CIR;Supervision/Assistance - 24 hour    Equipment Recommendations  None recommended by OT    Recommendations for Other Services Rehab consult    Precautions / Restrictions Precautions Precautions: Fall       Mobility Bed Mobility Overal bed mobility: Needs Assistance Bed Mobility: Sit to Supine;Sit to Sidelying     Supine to sit: Min guard Sit to supine: Min guard   General bed mobility comments: verbal cues to encourage her to do it for herself.  Increased effort from pt  Transfers Overall transfer level: Needs assistance Equipment used: 1 person hand held assist;Rolling walker (2 wheeled) Transfers: Sit to/from [facility] Sit to Stand: Min assist Stand pivot transfers: Min assist       General transfer comment: Assist for balance. as pt with bouncing of bil. LEs upon standing and ambulating in room.  She would buckle both knees, but able to control and correct hit without therapist's intervention.   Bouncing would calm as she walked, but when she stopped  it would intensify.     Balance Overall balance assessment: Needs assistance Sitting-balance support: Feet unsupported;No upper extremity supported Sitting balance-Leahy Scale: Good Sitting balance - Comments: donned/doffed socks   Standing balance support: Bilateral upper extremity supported Standing balance-Leahy Scale: Poor Standing balance comment: Dependent on UE support.                     ADL Overall ADL's : Needs assistance/impaired Eating/Feeding: Modified independent   Grooming: Modified independent;Sitting   Upper Body Bathing: Minimal assitance;Sitting   Lower Body Bathing: Moderate assistance;Sit to/from stand   Upper Body Dressing : Minimal assistance;Sitting   Lower Body Dressing: Moderate assistance;Sit to/from stand   Toilet Transfer: Minimal assistance;Ambulation;Comfort height toilet;Grab bars   Toileting- Clothing Manipulation and Hygiene: Moderate assistance;Sit to/from stand       Functional mobility during ADLs: Minimal assistance;Rolling walker General ADL Comments: pt reports she feels much better, but indicates dizziness (her head feels heavy) when seated EOB.   Upon standing, pt started to bounce her legs with tremoring bil. UEs.  both knees would buckke, but she was able to control and correct it.   When she returned to supine, tremoring noted in Rt. UE, but able to stop it when distracted.       Vision     Ocular Range of Motion: Restricted looking up Tracking/Visual Pursuits: Other (comment) (unable to track superiorly with Lt. worse than Rt. )         Additional Comments: Pt requires cues to track fully to Lt. she  was able to do so, but states she feels as though she can't.  She would not track superiorly, and states she can't see the pen at times on the left.  Fields not formally assesed this date.  She states that her vision is not normal, and when asked, states things don't look real, but is unable to eloborate except to say  everything looks far away   Perception     Praxis      Cognition   Behavior During Therapy: Flat affect Overall Cognitive Status: Difficult to assess                       Extremity/Trunk Assessment               Exercises     Shoulder Instructions       General Comments      Pertinent Vitals/ Pain         Home Living                                          Prior Functioning/Environment              Frequency Min 2X/week     Progress Toward Goals  OT Goals(current goals can now be found in the care plan section)  Progress towards OT goals: Progressing toward goals  Acute Rehab OT Goals Time For Goal Achievement: 03/07/14 Potential to Achieve Goals: Good ADL Goals Pt Will Perform Grooming: Independently;standing Pt Will Perform Lower Body Dressing: Independently;sit to/from stand Pt Will Transfer to Toilet: with modified independence;ambulating;regular height toilet Pt Will Perform Toileting - Clothing Manipulation and hygiene: Independently;sit to/from stand  Plan Discharge plan remains appropriate    Co-evaluation                 End of Session Equipment Utilized During Treatment: Rolling walker   Activity Tolerance Patient limited by fatigue   Patient Left in bed;with call bell/phone within reach;with bed alarm set;with family/visitor present;with nursing/sitter in room   Nurse Communication Mobility status        Time: 6761-9509 OT Time Calculation (min): 27 min  Charges: OT General Charges $OT Visit: 1 Procedure OT Treatments $Therapeutic Activity: 23-37 mins  Parisa Pinela M 02/22/2014, 10:17 AM

## 2014-02-23 DIAGNOSIS — G500 Trigeminal neuralgia: Secondary | ICD-10-CM

## 2014-02-23 LAB — GLUCOSE, CAPILLARY
GLUCOSE-CAPILLARY: 105 mg/dL — AB (ref 70–99)
Glucose-Capillary: 223 mg/dL — ABNORMAL HIGH (ref 70–99)
Glucose-Capillary: 177 mg/dL — ABNORMAL HIGH (ref 70–99)

## 2014-02-23 MED ORDER — PREGABALIN 50 MG PO CAPS: 50.0000 mg | ORAL_CAPSULE | Freq: Three times a day (TID) | ORAL | Status: DC

## 2014-02-23 MED ORDER — LOSARTAN POTASSIUM 25 MG PO TABS: 25.0000 mg | ORAL_TABLET | Freq: Every day | ORAL | Status: DC

## 2014-02-23 MED ORDER — HYDROCHLOROTHIAZIDE 12.5 MG PO CAPS: 12.5000 mg | ORAL_CAPSULE | Freq: Every day | ORAL | Status: DC

## 2014-02-23 MED ORDER — BUTALBITAL-APAP-CAFFEINE 50-325-40 MG PO TABS: 1.0000 | ORAL_TABLET | ORAL | Status: DC | PRN

## 2014-02-23 MED ORDER — AMITRIPTYLINE HCL 25 MG PO TABS: 25.0000 mg | ORAL_TABLET | Freq: Every day | ORAL | Status: DC

## 2014-02-23 MED ORDER — LOSARTAN POTASSIUM 25 MG PO TABS
25.0000 mg | ORAL_TABLET | Freq: Every day | ORAL | Status: DC
  Administered 2014-02-23: 25 mg via ORAL
  Filled 2014-02-23: qty 1

## 2014-02-23 MED ORDER — KETOROLAC TROMETHAMINE 10 MG PO TABS: 10.0000 mg | ORAL_TABLET | Freq: Four times a day (QID) | ORAL | Status: DC | PRN

## 2014-02-23 MED ORDER — FLUOXETINE HCL 10 MG PO CAPS: 10.0000 mg | ORAL_CAPSULE | Freq: Every day | ORAL | Status: DC

## 2014-02-23 NOTE — Progress Notes (Signed)
Clinical Social Work Department BRIEF PSYCHOSOCIAL ASSESSMENT 02/23/2014  Patient:  Brandi Bryant, Brandi Bryant     Account Number:  [id number]     Admit date:  02/20/2014  Clinical Social Worker:  Rolinda Roan  Date/Time:  02/23/2014 05:08 PM  Referred by:  Physician  Date Referred:  02/23/2014 Referred for  SNF Placement   Other Referral:   Interview type:  Patient Other interview type:    PSYCHOSOCIAL DATA Living Status:  SIBLING Admitted from facility:   Level of care:   Primary support name:  Brandi Bryant [phone] Primary support relationship to patient:  CHILD, ADULT Degree of support available:   Good support at bedside.    CURRENT CONCERNS  Other Concerns:    SOCIAL WORK ASSESSMENT / PLAN Clinical Social Worker (CSW) met with patient and her daughter Brandi Bryant at bedside. Patient speaks spanish and CSW used telephonic interpreting services. Interpreter was Audelia Hives his I.D number was [phone]. Patient reported that she lives with her sister Brandi Bryant and has also been staying with her daughter Brandi Bryant. Patient reported that she is agreeable to going to a skilled nursing facility for short term rehab.   Assessment/plan status:  Psychosocial Support/Ongoing Assessment of Needs Other assessment/ plan:   Information/referral to community resources:    PATIENT'S/FAMILY'S RESPONSE TO PLAN OF CARE: Patient and daughter thanked CSW for visit.

## 2014-02-23 NOTE — Progress Notes (Addendum)
[facility] PROGRESS NOTE  Brandi Bryant GYI:[id number] DOB: 08/21/1956 DOA: 02/20/2014 PCP: Brandi Marek, MD  Assessment/Plan: 1. Intractable headache  -The patient presented with complaints of right-sided headache with photophobia. and weakness and tremors. Initial workup in the ED including EKG initial lab work as well as CT of the head does not show any acute abnormality. MRI and EEG unremarkable. Neuro recs noted. Psychophysiologic factors suspected. See below. Started Ketoralac PRN and low dose amitriptyline QHS.   -Had recently discussed case with family at bedside. Pt reportedly "worries a lot" per son, and fears loneliness since her recent diagnosis of Bell's palsy. Family is concerned about depression. Have added SSRI on 02/22/14  Headache much improved today  2.Hypertension  -Currently stable. -Change lisinopril to ARB per below  3.Diabetes mellitus  -Continue her on sliding scale   4.bells palsy and Trigeminal neuralgia  -Continue Lyrica  -Speech evaluation -PT/OT recs for CIR  5.Imbalance -Noted by staff of unsteady gait and impaired balance. -Per OT, =>"not functioning well enough to discharge home alone. Continue to recommend CIR based on amount of physical assist pt is currently requiring"<= -Await CIR input  6. Dry cough -Will change ACEI to ARB  Code Status: Full Family Communication: Pt in room (indicate person spoken with, relationship, and if by phone, the number) Disposition Plan: Pending CIR eval  Consultants:  Neurology  Procedures:    Antibiotics:    HPI/Subjective: No acute events. New dry cough noted overnight. Reports headache improved.  Objective: Filed Vitals:   02/22/14 1809 02/22/14 2119 02/23/14 0112 02/23/14 0519  BP: 132/61 120/53 100/55 98/56  Pulse: 81 77 69 64  Temp: 99 F (37.2 C) 98.2 F (36.8 C) 98 F (36.7 C) 97.5 F (36.4 C)  TempSrc: Oral Oral Oral Oral  Resp: 18 18 18 18   Height:      Weight:       SpO2: 98% 99% 96% 96%   No intake or output data in the 24 hours ending 02/23/14 0820 Filed Weights   02/20/14 2331  Weight: 66.86 kg (147 lb 6.4 oz)    Exam:   General:  Awake, appears to be in acute pain  Cardiovascular: regular, s1, s2  Respiratory: normal resp effort, no wheezing  Abdomen: soft, nondistended  Musculoskeletal: perfused, no clubbing   Data Reviewed: Basic Metabolic Panel:  Recent Labs Lab 02/20/14 1951 02/21/14 0610  NA 142 143  K 4.2 3.7  CL 103 106  CO2 19 21  GLUCOSE 169* 147*  BUN 11 9  CREATININE 0.63 0.57  CALCIUM 9.7 8.8   Liver Function Tests:  Recent Labs Lab 02/20/14 1951 02/21/14 0610  AST 80* 59*  ALT 45* 35  ALKPHOS 98 73  BILITOT 0.4 0.5  PROT 7.5 6.2  ALBUMIN 3.8 3.1*   No results found for this basename: LIPASE, AMYLASE,  in the last 168 hours No results found for this basename: AMMONIA,  in the last 168 hours CBC:  Recent Labs Lab 02/20/14 1951 02/21/14 0610  WBC 8.1 5.5  NEUTROABS 3.7 2.4  HGB 14.1 12.4  HCT 39.8 34.7*  MCV 86.5 85.9  PLT 224 191   Cardiac Enzymes: No results found for this basename: CKTOTAL, CKMB, CKMBINDEX, TROPONINI,  in the last 168 hours BNP (last 3 results) No results found for this basename: PROBNP,  in the last 8760 hours CBG:  Recent Labs Lab 02/22/14 0644 02/22/14 1140 02/22/14 1649 02/22/14 2115 02/23/14 0635  GLUCAP 106* 168* 100* 145* 105*  No results found for this or any previous visit (from the past 240 hour(s)).   Studies: Mr Brain Wo Contrast  02/21/2014   CLINICAL DATA:  Right-sided numbness.  Double vision.  EXAM: MRI HEAD WITHOUT CONTRAST  TECHNIQUE: Multiplanar, multiecho pulse sequences of the brain and surrounding structures were obtained without intravenous contrast.  COMPARISON:  CT HEAD W/O CM dated 02/20/2014; MR C SPINE W/O CM dated 01/21/2014; MR HEAD W/O CM dated 01/20/2014  FINDINGS: The patient was unable to remain motionless for the exam. Small or  subtle lesions could be overlooked. In addition, there is susceptibility artifact related to braces or dental work which obscures much of the frontal lobes on diffusion-weighted imaging.  No evidence for acute infarction, hemorrhage, mass lesion, hydrocephalus, or extra-axial fluid. Normal for age cerebral volume. Old left occipital infarct without significant hemorrhage or mineralization. Mild to moderate small vessel disease. Negative orbits, sinuses, and mastoids. The major intracranial flow voids are preserved.  Compared with priors, a similar appearance is noted.  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   Electronically Signed   By: Brandi Bryant M.D.   On: 02/21/2014 17:59    Scheduled Meds: . amitriptyline  25 mg Oral QHS  . aspirin  81 mg Oral Daily  . carbamazepine  100 mg Oral TID  . enoxaparin (LOVENOX) injection  40 mg Subcutaneous Daily  . FLUoxetine  10 mg Oral Daily  . glimepiride  4 mg Oral Q breakfast  . hydrochlorothiazide  12.5 mg Oral Daily  . insulin aspart  0-15 Units Subcutaneous TID WC  . insulin aspart  0-5 Units Subcutaneous QHS  . letrozole  2.5 mg Oral Daily  . losartan  25 mg Oral Daily  . metoCLOPramide  10 mg Oral TID AC  .  morphine injection  2 mg Intravenous Once  . pregabalin  50 mg Oral TID   Continuous Infusions:   Principal Problem:   Intractable headache Active Problems:   HTN (hypertension)   Diabetes mellitus   Bell's palsy   Trigeminal neuralgia   TIA (transient ischemic attack)  Time spent: 2min  Brandi Bryant, [facility] Pager [phone]. If 7PM-7AM, please contact night-coverage at [email or web address], password [facility] 02/23/2014, 8:20 AM  LOS: 3 days

## 2014-02-23 NOTE — Progress Notes (Signed)
Clinical Social Work Department CLINICAL SOCIAL WORK PLACEMENT NOTE 02/23/2014  Patient:  Brandi Bryant, Brandi Bryant  Account Number:  [id number] Admit date:  02/20/2014  Clinical Social Worker:  Blima Rich, Latanya Presser  Date/time:  02/23/2014 05:14 PM  Clinical Social Work is seeking post-discharge placement for this patient at the following level of care:   [facility]   (*CSW will update this form in Epic as items are completed)   02/23/2014  Patient/family provided with [facility] Department of Clinical Social Work's list of facilities offering this level of care within the geographic area requested by the patient (or if unable, by the patient's family).  02/23/2014  Patient/family informed of their freedom to choose among providers that offer the needed level of care, that participate in Medicare, Medicaid or managed care program needed by the patient, have an available bed and are willing to accept the patient.  02/23/2014  Patient/family informed of [facility]' ownership interest in [facility], as well as of the fact that they are under no obligation to receive care at this facility.  PASARR submitted to EDS on 02/23/2014 PASARR number received from EDS on 02/23/2014  FL2 transmitted to all facilities in geographic area requested by pt/family on  02/23/2014 FL2 transmitted to all facilities within larger geographic area on   Patient informed that his/her managed care company has contracts with or will negotiate with  certain facilities, including the following:     Patient/family informed of bed offers received:   Patient chooses bed at [facility] Physician recommends and patient chooses bed at    Patient to be transferred to [facility] on  02/23/2014 Patient to be transferred to facility by [facility]  The following physician request were entered in Epic:   Additional Comments:

## 2014-02-23 NOTE — Progress Notes (Signed)
Patient received call from RN Case Manager stating that patient needs to be D/C'ed today due to being observation status. Clinical Education officer, museum (CSW) contacted Peter Congo the on Government social research officer at [facility] who reported that they can accept patient with an LOG. Peter Congo also reported that she would complete medicaid application with patient on Monday 02/24/14. Patient accepted the bed offer from [facility]. Per MD patient needs EMS transportation to [facility]. Peter Congo faxed paper work to [place] to give to patient and patient's daughter Samella Parr to complete. CSW faxed completed paper work, D/C summary, signed FL2, and prescriptions to [facility]. Peter Congo reported that she received the fax and stated that patient is clear to come. CSW made patient's daughter aware that if the medicaid application is denied then the patient will have to go home from [facility]. Daughter verbalized her understanding. CSW prepared D/C packet and arranged non-emergency EMS ([facility]) for transport. Please reconsult if further social work needs arise. CSW signing off.   Blima Rich, [place] [facility] CSW [phone]

## 2014-02-23 NOTE — Discharge Summary (Addendum)
Physician Discharge Summary  Sparks ENI:[id number] DOB: 01/21/1956 DOA: 02/20/2014  PCP: [name], MD  Admit date: 02/20/2014 Discharge date: 02/23/2014  Time spent: 35 minutes  Recommendations for Outpatient Follow-up:  1. Follow up with PCP in 1-2 weeks  Discharge Diagnoses:  Principal Problem:   Intractable headache Active Problems:   HTN (hypertension)   Diabetes mellitus   Bell's palsy   Trigeminal neuralgia   TIA (transient ischemic attack)   Discharge Condition: Improved  Diet recommendation: Diabetic  Filed Weights   02/20/14 2331  Weight: 66.86 kg (147 lb 6.4 oz)    History of present illness:  Brandi Bryant is a 58 y.o. female with Past medical history of Hypertension, diabetes, TIA, recent Bell's palsy with trigeminal neuralgia.  The patient presented with complaints of headache which is located on the right side on the frontal region. The pain started last night which was associated with abdominal pain one episode of loose motion without any blood and nausea and one episode of vomiting without any blood.  She denies any fever but felt chills she complains of On and off abdominal pain, burning urination yesterday, some sore throat, photophobia, phonophobia, shortness of breath without any cough.  She claims that she stop her medication yesterday as she felt that it was causing her abdominal pain.  She denies any focal neurological deficit, fall, trauma, injury, neck stiffness.  She initially presented with tremors which he felt was due to severe pain.  The history was obtained with the help of interpreter since the patient does not speak [place]..  The patient is coming from home. And at her baseline independent for most of her ADL.   Hospital Course:  1. Intractable headache  -The patient presented with complaints of right-sided headache with photophobia. and weakness and tremors. Initial workup in the ED including EKG initial lab  work as well as CT of the head does not show any acute abnormality. MRI and EEG unremarkable. Neuro recs noted. Psychophysiologic factors suspected. See below. Started Ketoralac PRN and low dose amitriptyline QHS.  -Had recently discussed case with family at bedside. Pt reportedly "worries a lot" per son, and fears loneliness since her recent diagnosis of Bell's palsy. Family is concerned about depression. Have added SSRI on 02/22/14  Headache much improved by shortly after starting amitriptyline  2.Hypertension  -Currently stable.  -Changed lisinopril to ARB per below  3.Diabetes mellitus  -Continue her on sliding scale  4.bells palsy and Trigeminal neuralgia  -Continue Lyrica  -Speech evaluation  -PT/OT recs for CIR  5.Imbalance  -Noted by staff of unsteady gait and impaired balance.  -Per OT, =>"not functioning well enough to discharge home alone. Continue to recommend CIR based on amount of physical assist pt is currently requiring"<=  -Family agreeable to SNF for rehab  6. Dry cough  -Will change ACEI to ARB 7. TIA -Pertinent to current visit  Consultations:  Neurology  Discharge Exam: Filed Vitals:   02/23/14 0112 02/23/14 0519 02/23/14 1025 02/23/14 1438  BP: 100/55 98/56 116/60 134/63  Pulse: 69 64 58 77  Temp: 98 F (36.7 C) 97.5 F (36.4 C) 98.3 F (36.8 C) 98.2 F (36.8 C)  TempSrc: Oral Oral Oral Oral  Resp: 18 18 18 18   Height:      Weight:      SpO2: 96% 96% 96% 97%    General: awake, in nad Cardiovascular: regular, s1, s2 Respiratory: normal resp effort, no wheezing  Discharge Instructions   Future Appointments Provider  Department Dept Phone   02/27/2014 12:00 PM [facility] [place] [phone]   04/17/2014 3:15 PM Heath Lark, MD [facility] [phone]   07/22/2014 10:15 AM [facility] [place] [facility] [phone]       Medication List    STOP taking  these medications       lisinopril-hydrochlorothiazide 20-12.5 MG per tablet  Commonly known as:  PRINZIDE,ZESTORETIC      TAKE these medications       acetaminophen 500 MG tablet  Commonly known as:  TYLENOL  Take 1,000 mg by mouth 5 (five) times daily as needed (pain).     amitriptyline 25 MG tablet  Commonly known as:  ELAVIL  Take 1 tablet (25 mg total) by mouth at bedtime.     aspirin 81 MG chewable tablet  Chew by mouth daily.     B-complex with vitamin C tablet  Take 1 tablet by mouth daily.     calcium-vitamin D 500-200 MG-UNIT per tablet  Commonly known as:  OSCAL WITH D  Take 1 tablet by mouth daily with breakfast.     carbamazepine 100 MG chewable tablet  Commonly known as:  TEGRETOL  Chew 1 tablet (100 mg total) by mouth 3 (three) times daily.     FLUoxetine 10 MG capsule  Commonly known as:  PROZAC  Take 1 capsule (10 mg total) by mouth daily.     glimepiride 4 MG tablet  Commonly known as:  AMARYL  Take 4 mg by mouth daily with breakfast.     hydrochlorothiazide 12.5 MG capsule  Commonly known as:  MICROZIDE  Take 1 capsule (12.5 mg total) by mouth daily.     ibuprofen 200 MG tablet  Commonly known as:  ADVIL,MOTRIN  Take 800 mg by mouth every 4 (four) hours as needed (pain).     ketorolac 10 MG tablet  Commonly known as:  TORADOL  Take 1 tablet (10 mg total) by mouth every 6 (six) hours as needed.     letrozole 2.5 MG tablet  Commonly known as:  FEMARA  Take 2.5 mg by mouth daily.     losartan 25 MG tablet  Commonly known as:  COZAAR  Take 1 tablet (25 mg total) by mouth daily.     metFORMIN 850 MG tablet  Commonly known as:  GLUCOPHAGE  Take 0.5-1 tablets (425-850 mg total) by mouth 2 (two) times daily with a meal. Takes 850mg  in the morning and 425mg  in the evening     metoCLOPramide 10 MG tablet  Commonly known as:  REGLAN  Take 1 tablet (10 mg total) by mouth 3 (three) times daily before meals.     pregabalin 50 MG capsule   Commonly known as:  LYRICA  Take 1 capsule (50 mg total) by mouth 3 (three) times daily.       Allergies  Allergen Reactions  . Gadolinium Derivatives     Code: VOM, Desc: Pt began vomiting 45 sec after MRI contrast injection of Multihance, Onset Date: [id number]         Follow-up Information   Follow up with [name], MD. Schedule an appointment as soon as possible for a visit in 1 week.   Specialty:  Internal Medicine   Contact information:   [place] [phone]        The results of significant diagnostics from this hospitalization (including imaging, microbiology, ancillary  and laboratory) are listed below for reference.    Significant Diagnostic Studies: Ct Head (brain) Wo Contrast  02/20/2014   CLINICAL DATA:  Code stroke. Nausea, dizziness, tremors. Acute left leg weakness.  EXAM: CT HEAD WITHOUT CONTRAST  TECHNIQUE: Contiguous axial images were obtained from the base of the skull through the vertex without intravenous contrast.  COMPARISON:  Head MRI and CT 01/20/2014  FINDINGS: Images are mildly degraded by motion. 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 Dilated perivascular space in the left basal ganglia is unchanged. There is no evidence of acute cortical infarct, mass, midline shift, intracranial hemorrhage, or extra-axial fluid collection. Patchy periventricular white matter hypodensity is unchanged and compatible with mild chronic small vessel ischemic disease. Orbits are unremarkable. Mastoid air cells and paranasal sinuses are clear.  IMPRESSION: 1. No evidence of acute intracranial abnormality. 2. Unchanged, remote left occipital lobe infarct and mild chronic small vessel ischemic disease. These results were called by telephone at the time of interpretation on 02/20/2014 at 7:44 PM to Dr. Leonel Ramsay, who verbally acknowledged these results.   Electronically Signed   By: Logan Bores   On: 02/20/2014 19:44   Mr Brain Wo  Contrast  02/21/2014   CLINICAL DATA:  Right-sided numbness.  Double vision.  EXAM: MRI HEAD WITHOUT CONTRAST  TECHNIQUE: Multiplanar, multiecho pulse sequences of the brain and surrounding structures were obtained without intravenous contrast.  COMPARISON:  CT HEAD W/O CM dated 02/20/2014; MR C SPINE W/O CM dated 01/21/2014; MR HEAD W/O CM dated 01/20/2014  FINDINGS: The patient was unable to remain motionless for the exam. Small or subtle lesions could be overlooked. In addition, there is susceptibility artifact related to braces or dental work which obscures much of the frontal lobes on diffusion-weighted imaging.  No evidence for acute infarction, hemorrhage, mass lesion, hydrocephalus, or extra-axial fluid. Normal for age cerebral volume. Old left occipital infarct without significant hemorrhage or mineralization. Mild to moderate small vessel disease. Negative orbits, sinuses, and mastoids. The major intracranial flow voids are preserved.  Compared with priors, a similar appearance is noted.  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   Electronically Signed   By: [name] M.D.   On: 02/21/2014 17:59    Microbiology: No results found for this or any previous visit (from the past 240 hour(s)).   Labs: Basic Metabolic Panel:  Recent Labs Lab 02/20/14 1951 02/21/14 0610  NA 142 143  K 4.2 3.7  CL 103 106  CO2 19 21  GLUCOSE 169* 147*  BUN 11 9  CREATININE 0.63 0.57  CALCIUM 9.7 8.8   Liver Function Tests:  Recent Labs Lab 02/20/14 1951 02/21/14 0610  AST 80* 59*  ALT 45* 35  ALKPHOS 98 73  BILITOT 0.4 0.5  PROT 7.5 6.2  ALBUMIN 3.8 3.1*   No results found for this basename: LIPASE, AMYLASE,  in the last 168 hours No results found for this basename: AMMONIA,  in the last 168 hours CBC:  Recent Labs Lab 02/20/14 1951 02/21/14 0610  WBC 8.1 5.5  NEUTROABS 3.7 2.4  HGB 14.1 12.4  HCT 39.8 34.7*  MCV 86.5 85.9  PLT 224 191    Cardiac Enzymes: No results found for this basename: CKTOTAL, CKMB, CKMBINDEX, TROPONINI,  in the last 168 hours BNP: BNP (last 3 results) No results found for this basename: PROBNP,  in the last 8760 hours CBG:  Recent Labs Lab 02/22/14 1140 02/22/14 1649 02/22/14 2115 02/23/14 0635 02/23/14 1117  GLUCAP 168* 100* 145* 105* 223*    Signed:  Xavius Spadafore K  [facility] 02/23/2014, 3:02 PM

## 2014-02-23 NOTE — Progress Notes (Signed)
Discharge [facility] via [facility]. Report given to receiving RN. Patient was alert and oriented, not in any distress upon discharge.

## 2014-02-23 NOTE — Progress Notes (Signed)
Occupational Therapy Treatment Patient Details Name: Brandi Bryant MRN: [id number] DOB: 15-Jul-1956 Today's Date: 02/23/2014    History of present illness Brandi Bryant is a 58 y.o. female with Past medical history of Hypertension, diabetes, TIA, recent Bell's palsy with trigeminal neuralgia. Recently admitted a month ago with left facial pain and lost ability to speak while in CT scan. MRI of head in March was negative for acute infarct (+ old left occipital infarct).   OT comments  Pt improving, but continues to require min A for functional mobility and mod A for LB ADLs.  Pt is very fearful when she is challenged to support herself with only one UE in order to use the other UE for ADL tasks.  Did lose balance posteriorly.   Family present and interpreting and are very supportive.   Follow Up Recommendations  SNF    Equipment Recommendations  None recommended by OT    Recommendations for Other Services      Precautions / Restrictions Precautions Precautions: Fall       Mobility Bed Mobility                  Transfers Overall transfer level: Needs assistance Equipment used: Standard walker Transfers: Sit to/from Stand;Stand Pivot Transfers Sit to Stand: Min assist Stand pivot transfers: Min assist            Balance Overall balance assessment: Needs assistance         Standing balance support: Single extremity supported Standing balance-Leahy Scale: Poor Standing balance comment: see comments under ADL section                    ADL                           Toilet Transfer: Minimal assistance;Ambulation;Comfort height toilet;Grab bars Toilet Transfer Details (indicate cue type and reason): Pt lost balance falling back on to toilet while attempting to pull down pants Toileting- Clothing Manipulation and Hygiene: Maximal assistance;Sit to/from stand Toileting - Clothing Manipulation Details (indicate cue type and  reason): Pt required max encouragement and displayed increased anxiety when attempting to pull pants up and down.  Pt very fearful and lost balance posteriorly sitting on commode     Functional mobility during ADLs: Minimal assistance;Rolling walker General ADL Comments: Pt moves very slowly with bil. LE bouncing.  Knees will buckle, but she will catch selt.  Worked on reaching with Lt. UE and max encouragement       Vision                 Additional Comments: Pt is wearing eye patch today   Perception     Praxis      Cognition   Behavior During Therapy: Anxious Overall Cognitive Status: Difficult to assess                       Extremity/Trunk Assessment               Exercises     Shoulder Instructions       General Comments      Pertinent Vitals/ Pain       Pain 4/10 headache.   Home Living  Prior Functioning/Environment              Frequency Min 2X/week     Progress Toward Goals  OT Goals(current goals can now be found in the care plan section)  Progress towards OT goals: Progressing toward goals  ADL Goals Pt Will Perform Grooming: Independently;standing Pt Will Perform Lower Body Dressing: Independently;sit to/from stand Pt Will Transfer to Toilet: with modified independence;ambulating;regular height toilet Pt Will Perform Toileting - Clothing Manipulation and hygiene: Independently;sit to/from stand  Plan Discharge plan needs to be updated    Co-evaluation                 End of Session Equipment Utilized During Treatment: Rolling walker   Activity Tolerance Patient limited by fatigue   Patient Left in bed;with call bell/phone within reach;with family/visitor present   Nurse Communication Mobility status    Functional Assessment Tool Used: clinical judgment Functional Limitation: Self care Self Care Current Status (Y0998): At least 40 percent but less  than 60 percent impaired, limited or restricted Self Care Goal Status (P3825): At least 1 percent but less than 20 percent impaired, limited or restricted Self Care Discharge Status [phone]): At least 40 percent but less than 60 percent impaired, limited or restricted   Time: 1745-1800 OT Time Calculation (min): 15 min  Charges: OT G-codes **NOT FOR INPATIENT CLASS** Functional Assessment Tool Used: clinical judgment Functional Limitation: Self care Self Care Current Status (Q7341): At least 40 percent but less than 60 percent impaired, limited or restricted Self Care Goal Status (P3790): At least 1 percent but less than 20 percent impaired, limited or restricted Self Care Discharge Status [phone]): At least 40 percent but less than 60 percent impaired, limited or restricted OT General Charges $OT Visit: 1 Procedure OT Treatments $Self Care/Home Management : 8-22 mins  Danyetta Gillham M 02/23/2014, 6:08 PM

## 2014-02-23 NOTE — Progress Notes (Signed)
Pt has started to have dry non productive cough. Pt and family concerned. Will pass along to day shift RN to remind MD to address.

## 2014-02-25 LAB — GLUCOSE, CAPILLARY: GLUCOSE-CAPILLARY: 170 mg/dL — AB (ref 70–99)

## 2014-02-27 ENCOUNTER — Ambulatory Visit: Payer: No Typology Code available for payment source

## 2014-03-06 ENCOUNTER — Ambulatory Visit (INDEPENDENT_AMBULATORY_CARE_PROVIDER_SITE_OTHER): Payer: No Typology Code available for payment source | Admitting: Family Medicine

## 2014-03-06 ENCOUNTER — Other Ambulatory Visit (HOSPITAL_COMMUNITY)
Admission: RE | Admit: 2014-03-06 | Discharge: 2014-03-06 | Disposition: A | Discharge status: Home / Self Care | Payer: No Typology Code available for payment source | Source: Ambulatory Visit | Attending: Family Medicine | Admitting: Family Medicine

## 2014-03-06 VITALS — BP 156/79 | HR 82 | Temp 98.3°F | Wt 152.0 lb

## 2014-03-06 DIAGNOSIS — Z01419 Encounter for gynecological examination (general) (routine) without abnormal findings: Secondary | ICD-10-CM

## 2014-03-06 DIAGNOSIS — Z124 Encounter for screening for malignant neoplasm of cervix: Secondary | ICD-10-CM

## 2014-03-06 DIAGNOSIS — Z1151 Encounter for screening for human papillomavirus (HPV): Secondary | ICD-10-CM | POA: Insufficient documentation

## 2014-03-06 NOTE — Patient Instructions (Addendum)
[name], you did well for your PAP test today, please follow up with your PCP for other health concern,I will call you with your test result.  Pap Test A Pap test checks the cells on the surface of your cervix. Your doctor will look for cell changes that are not normal, an infection, or cancer. If the cells no longer look normal, it is called dysplasia. Dysplasia can turn into cancer. Regular Pap tests are important to stop cancer from developing. BEFORE THE PROCEDURE  Ask your doctor when to schedule your Pap test. Timing the test around your period may be important.  Do not douche or have sex (intercourse) for 24 hours before the test.  Do not put creams on your vagina or use tampons for 24 hours before the test.  Go pee (urinate) just before the test. PROCEDURE  You will lie on an exam table with your feet in stirrups.  A warm metal or plastic tool (speculum) will be put in your vagina to open it up.  Your doctor will use a small, plastic brush or wooden spatula to take cells from your cervix.  The cells will be put in a lab container.  The cells will be checked under a microscope to see if they are normal or not. AFTER THE PROCEDURE Get your test results. If they are abnormal, you may need more tests. Document Released: 12/10/2010 Document Revised: 01/30/2012 Document Reviewed: 11/03/2011 [facility] Patient Information 2014 [place], [place].

## 2014-03-06 NOTE — Progress Notes (Signed)
Patient ID: Brandi Bryant, female   DOB: 12-23-1955, 58 y.o.   MRN: [id number] Subjective:     Brandi Bryant is a 58 y.o. female here for a routine exam.  Current complaints: Here for PAP.  Personal health questionnaire reviewed: no.   Gynecologic History No LMP recorded. Patient is postmenopausal. Contraception: none Last Pap: 2009. Results were: normal Last mammogram: Nov 2014. Results were: normal  Obstetric History OB History  Gravida Para Term Preterm AB SAB TAB Ectopic Multiple Living  6 6 6       6     # Outcome Date GA Lbr Len/2nd Weight Sex Delivery Anes PTL Lv  6 TRM           5 TRM           4 TRM           3 TRM           2 TRM           1 TRM                The following portions of the patient's history were reviewed and updated as appropriate: allergies, current medications, past family history, past medical history, past social history, past surgical history and problem list. Current Outpatient Prescriptions on File Prior to Visit  Medication Sig Dispense Refill  . acetaminophen (TYLENOL) 500 MG tablet Take 1,000 mg by mouth 5 (five) times daily as needed (pain).      Marland Kitchen amitriptyline (ELAVIL) 25 MG tablet Take 1 tablet (25 mg total) by mouth at bedtime.  30 tablet  0  . aspirin 81 MG chewable tablet Chew by mouth daily.      . B Complex-C (B-COMPLEX WITH VITAMIN C) tablet Take 1 tablet by mouth daily.      . butalbital-acetaminophen-caffeine (FIORICET, ESGIC) 50-325-40 MG per tablet Take 1-2 tablets by mouth every 4 (four) hours as needed for headache or migraine.  14 tablet  0  . calcium-vitamin D (OSCAL WITH D) 500-200 MG-UNIT per tablet Take 1 tablet by mouth daily with breakfast.      . carbamazepine (TEGRETOL) 100 MG chewable tablet Chew 1 tablet (100 mg total) by mouth 3 (three) times daily.  90 tablet  0  . FLUoxetine (PROZAC) 10 MG capsule Take 1 capsule (10 mg total) by mouth daily.  30 capsule  0  . glimepiride (AMARYL) 4 MG tablet Take  4 mg by mouth daily with breakfast.      . hydrochlorothiazide (MICROZIDE) 12.5 MG capsule Take 1 capsule (12.5 mg total) by mouth daily.  30 capsule  0  . ibuprofen (ADVIL,MOTRIN) 200 MG tablet Take 800 mg by mouth every 4 (four) hours as needed (pain).      Marland Kitchen ketorolac (TORADOL) 10 MG tablet Take 1 tablet (10 mg total) by mouth every 6 (six) hours as needed.  20 tablet  0  . letrozole (FEMARA) 2.5 MG tablet Take 2.5 mg by mouth daily.      Marland Kitchen losartan (COZAAR) 25 MG tablet Take 1 tablet (25 mg total) by mouth daily.  30 tablet  0  . metFORMIN (GLUCOPHAGE) 850 MG tablet Take 0.5-1 tablets (425-850 mg total) by mouth 2 (two) times daily with a meal. Takes 850mg  in the morning and 425mg  in the evening  60 tablet  3  . metoCLOPramide (REGLAN) 10 MG tablet Take 1 tablet (10 mg total) by mouth 3 (three) times daily before meals.  90 tablet  3  . pregabalin (LYRICA) 50 MG capsule Take 1 capsule (50 mg total) by mouth 3 (three) times daily.  60 capsule  6  . pregabalin (LYRICA) 50 MG capsule Take 1 capsule (50 mg total) by mouth 3 (three) times daily.  30 capsule  0  . [DISCONTINUED] escitalopram (LEXAPRO) 10 MG tablet Take 10 mg by mouth daily.       No current facility-administered medications on file prior to visit.   Past Medical History  Diagnosis Date  . Breast cancer   . HTN (hypertension)   . Diabetes mellitus   . Cholelithiasis   . Hepatic steatosis   . Abdominal pain   . History of breast cancer 06/01/2012  . Arthralgia 08/13/2013  . Chest pain 09/30/2013  . Stroke 2006     Review of Systems Constitutional: negative Cardiovascular: negative Gastrointestinal: occassional stomach pain due to multiple medication, she is currently asymptomatic. Genitourinary:negative   Ext: Mild right foot edema about 1 plus.No tenderness. Objective:   Filed Vitals:   03/06/14 1030  BP: 156/79  Pulse: 82  Temp: 98.3 F (36.8 C)  TempSrc: Oral  Weight: 152 lb (68.947 kg)     There were no  vitals taken for this visit. General appearance: alert Lungs: clear to auscultation bilaterally Heart: regular rate and rhythm, S1, S2 normal, no murmur, click, rub or gallop Abdomen: soft, non-tender; bowel sounds normal; no masses,  no organomegaly Pelvic: cervix normal in appearance, external genitalia normal, no adnexal masses or tenderness, no cervical motion tenderness, rectovaginal septum normal, uterus normal size, shape, and consistency and vagina normal without discharge    Assessment:    Healthy female exam.    Plan:    PAP  and gynecologic exam well tolerated.   Of note is mild pedal swelling for which I instructed patient to discuss with her PCP at the [facility].

## 2014-03-11 ENCOUNTER — Ambulatory Visit: Payer: Self-pay | Admitting: Neurology

## 2014-03-20 ENCOUNTER — Ambulatory Visit (INDEPENDENT_AMBULATORY_CARE_PROVIDER_SITE_OTHER): Payer: Self-pay | Admitting: Neurology

## 2014-03-20 ENCOUNTER — Encounter (INDEPENDENT_AMBULATORY_CARE_PROVIDER_SITE_OTHER): Payer: Self-pay

## 2014-03-20 ENCOUNTER — Encounter: Payer: Self-pay | Admitting: Neurology

## 2014-03-20 VITALS — BP 142/88 | HR 99 | Temp 98.9°F | Ht 62.0 in | Wt 154.0 lb

## 2014-03-20 DIAGNOSIS — E1165 Type 2 diabetes mellitus with hyperglycemia: Secondary | ICD-10-CM

## 2014-03-20 DIAGNOSIS — I6350 Cerebral infarction due to unspecified occlusion or stenosis of unspecified cerebral artery: Secondary | ICD-10-CM

## 2014-03-20 DIAGNOSIS — G510 Bell's palsy: Secondary | ICD-10-CM

## 2014-03-20 DIAGNOSIS — IMO0001 Reserved for inherently not codable concepts without codable children: Secondary | ICD-10-CM

## 2014-03-20 DIAGNOSIS — I639 Cerebral infarction, unspecified: Secondary | ICD-10-CM

## 2014-03-20 DIAGNOSIS — G500 Trigeminal neuralgia: Secondary | ICD-10-CM

## 2014-03-20 DIAGNOSIS — IMO0002 Reserved for concepts with insufficient information to code with codable children: Secondary | ICD-10-CM

## 2014-03-20 NOTE — Patient Instructions (Signed)
Continue tegretol 100 mg three times a day.  Continue amitriptyline 25 mg at bedtime.

## 2014-03-20 NOTE — Progress Notes (Signed)
Subjective:    Patient ID: Brandi Bryant is a 58 y.o. female.  HPI    Star Age, MD, PhD [facility] [address] [place], [place] [address]  Dear Dr. Charlies Silvers,   I saw your patient, Brandi Bryant, upon your kind request in my neurologic clinic today for initial consultation of her history of Bell's palsy. Of note, the patient no showed on 03/11/2014 for an appointment with me. The patient is accompanied by her daughter and an interpreter, Spero Geralds, today. As you know, Brandi Bryant is a 58 year-old right-handed woman with an underlying medical history of diabetes with poor glycemic control and evidence of gastroparesis, hypertension, breast cancer, cholelithiasis, history of stroke in 2006 and Bell's palsy, which was diagnosed in March of this year and treated with acyclovir and prednisone. She had a brain MRI at the time. I reviewed the test result from her brain MRI from 01/21/2014 as well as cervical spine MRI without contrast from 01/21/2014. She had mild degenerative disc disease without stenosis identified, mild white matter disease in her brain and no acute findings and an unchanged appearance of a left remote occipital ischemic stroke. She has residual R sided weakness.   She presented back to the emergency room on 02/20/2014 with right-sided shaking which was worked up with an EEG, which was reported as normal in the awake state, and she had a brain MRI without contrast on 02/21/2014 which showed a remote left occipital infarct and no acute findings and no changes from prior MRI from 01/21/2014. In March, she was also diagnosed with TGN and placed on tegretol, then in April she was placed on Elavil at night 25 mg and Lyrical was added. She and her daughter are not sure how much Tegretol, Elavil and Lyrica she is actually getting. She is currently at [employer] and may go home with daughter (and her family) next  week.  Elavil helped her sleep and tegretol has apparently helped her R facial pain. She has had some anxiety and depression.  Her Past Medical History Is Significant For: Past Medical History  Diagnosis Date  . Breast cancer   . HTN (hypertension)   . Diabetes mellitus   . Cholelithiasis   . Hepatic steatosis   . Abdominal pain   . History of breast cancer 06/01/2012  . Arthralgia 08/13/2013  . Chest pain 09/30/2013  . Stroke 2006  . Bell's palsy     Her Past Surgical History Is Significant For: Past Surgical History  Procedure Laterality Date  . Tubal ligation    . Breast lumpectomy  2010    Her Family History Is Significant For: Family History  Problem Relation Age of Onset  . Thyroid cancer Sister     thyroid  . Hypertension Mother   . Diabetes Mother   . Migraines Daughter     Her Social History Is Significant For: History   Social History  . Marital Status: Single    Spouse Name: N/A    Number of Children: N/A  . Years of Education: N/A   Social History Main Topics  . Smoking status: Never Smoker   . Smokeless tobacco: None  . Alcohol Use: No  . Drug Use: No  . Sexual Activity: Not Currently    Birth Control/ Protection: Surgical   Other Topics Concern  . None   Social History Narrative  . None    Her Allergies Are:  Allergies  Allergen Reactions  . Gadolinium Derivatives  Code: VOM, Desc: Pt began vomiting 45 sec after MRI contrast injection of Multihance, Onset Date: [id number]    :   Her Current Medications Are:  Outpatient Encounter Prescriptions as of 03/20/2014  Medication Sig  . acetaminophen (TYLENOL) 500 MG tablet Take 1,000 mg by mouth 5 (five) times daily as needed (pain).  Marland Kitchen amitriptyline (ELAVIL) 25 MG tablet Take 1 tablet (25 mg total) by mouth at bedtime.  Marland Kitchen aspirin 81 MG chewable tablet Chew by mouth daily.  . B Complex-C (B-COMPLEX WITH VITAMIN C) tablet Take 1 tablet by mouth daily.  . butalbital-acetaminophen-caffeine  (FIORICET, ESGIC) 50-325-40 MG per tablet Take 1-2 tablets by mouth every 4 (four) hours as needed for headache or migraine.  . calcium-vitamin D (OSCAL WITH D) 500-200 MG-UNIT per tablet Take 1 tablet by mouth daily with breakfast.  . carbamazepine (TEGRETOL) 100 MG chewable tablet Chew 1 tablet (100 mg total) by mouth 3 (three) times daily.  Marland Kitchen FLUoxetine (PROZAC) 10 MG capsule Take 1 capsule (10 mg total) by mouth daily.  Marland Kitchen glimepiride (AMARYL) 4 MG tablet Take 4 mg by mouth daily with breakfast.  . hydrochlorothiazide (MICROZIDE) 12.5 MG capsule Take 1 capsule (12.5 mg total) by mouth daily.  Marland Kitchen ibuprofen (ADVIL,MOTRIN) 200 MG tablet Take 800 mg by mouth every 4 (four) hours as needed (pain).  Marland Kitchen ketorolac (TORADOL) 10 MG tablet Take 1 tablet (10 mg total) by mouth every 6 (six) hours as needed.  Marland Kitchen letrozole (FEMARA) 2.5 MG tablet Take 2.5 mg by mouth daily.  Marland Kitchen losartan (COZAAR) 25 MG tablet Take 1 tablet (25 mg total) by mouth daily.  . metFORMIN (GLUCOPHAGE) 850 MG tablet Take 0.5-1 tablets (425-850 mg total) by mouth 2 (two) times daily with a meal. Takes 850mg  in the morning and 425mg  in the evening  . metoCLOPramide (REGLAN) 10 MG tablet Take 1 tablet (10 mg total) by mouth 3 (three) times daily before meals.  . pregabalin (LYRICA) 50 MG capsule Take 1 capsule (50 mg total) by mouth 3 (three) times daily.  . [DISCONTINUED] pregabalin (LYRICA) 50 MG capsule Take 1 capsule (50 mg total) by mouth 3 (three) times daily.  :   Review of Systems:  Out of a complete 14 point review of systems, all are reviewed and negative with the exception of these symptoms as listed below:  Review of Systems  Constitutional: Positive for fatigue and unexpected weight change (gain).  Eyes: Positive for pain and visual disturbance (blurred).  Respiratory: Negative.   Cardiovascular: Positive for chest pain ("sometimes"), palpitations ("sometimes") and leg swelling.  Gastrointestinal: Positive for diarrhea.        Incontinence  Endocrine: Positive for polydipsia.       Flushing  Genitourinary: Positive for enuresis.  Musculoskeletal: Positive for arthralgias and myalgias.  Skin: Negative.   Allergic/Immunologic: Negative.   Neurological: Positive for seizures, weakness (legs), numbness (legs) and headaches.       Memory loss  Hematological: Bruises/bleeds easily.       Anemia  Psychiatric/Behavioral: Positive for confusion and dysphoric mood. The patient is nervous/anxious.     Objective:  Neurologic Exam  Physical Exam Physical Examination:   Filed Vitals:   03/20/14 1403  BP: 142/88  Pulse: 99  Temp: 98.9 F (37.2 C)    General Examination: The patient is a very pleasant 58 y.o. female in no acute distress. She appears well-developed and well-nourished and well groomed. She came in with her 2 wheeled walker.   HEENT: Normocephalic, atraumatic,  pupils are equal, round and reactive to light and accommodation. Funduscopic exam is normal with sharp disc margins noted. Extraocular tracking is good without limitation to gaze excursion or nystagmus noted. Normal smooth pursuit is noted. Hearing is grossly intact. Tympanic membranes are clear bilaterally. Face is asymmetric with R facial LMN weakness and normal facial sensation. Speech is clear with no dysarthria noted. There is no hypophonia. There is no lip, neck/head, jaw or voice tremor. Neck is supple with full range of passive and active motion. There are no carotid bruits on auscultation. Oropharynx exam reveals: mild mouth dryness, adequate dental hygiene and mild airway crowding. Mallampati is class II. Tongue protrudes centrally and palate elevates symmetrically.   Chest: Clear to auscultation without wheezing, rhonchi or crackles noted.  Heart: S1+S2+0, regular and normal without murmurs, rubs or gallops noted.   Abdomen: Soft, non-tender and non-distended with normal bowel sounds appreciated on auscultation.  Extremities: There  is no pitting edema in the distal lower extremities bilaterally. Pedal pulses are intact.  Skin: Warm and dry without trophic changes noted. There are no varicose veins.  Musculoskeletal: exam reveals no obvious joint deformities, tenderness or joint swelling or erythema.   Neurologically:  Mental status: The patient is awake, alert and oriented in all 4 spheres. Her immediate and remote memory, attention, language skills and fund of knowledge are appropriate. There is no evidence of aphasia, agnosia, apraxia or anomia. Speech is clear with normal prosody and enunciation. Thought process is linear. Mood is constricted and affect is blunted.  Cranial nerves II - XII are as described above under HEENT exam. In addition: shoulder shrug is normal with equal shoulder height noted. Motor exam: Normal bulk, strength and tone is noted with the exception of a slight degree of weakness in the right upper extremity and 4+ out of 5 weakness in the right lower extremity. There is no drift, tremor or rebound. Romberg is negative. Reflexes are 2+ throughout. Babinski: Toes are flexor bilaterally. Fine motor skills and coordination: Very mildly impaired on the right   Cerebellar testing: No dysmetria or intention tremor on finger to nose testing. There is no truncal or gait ataxia.  Sensory exam: intact to light touch, pinprick, vibration, temperature sense in the upper and lower extremities, with the exception of mild decrease in pinprick sensation in the right upper extremity.   Gait, station and balance: She stands very slowly. No veering to one side is noted. No leaning to one side is noted. Posture is age-appropriate, perhaps mildly stooped. Gait shows slow and cautious gait with decreased stride length and decreased pace. She turns in 5 steps. She uses her 2 wheeled walker.                Assessment and Plan:   In summary, Khalaya Kroh is a very pleasant 58 y.o.-year old female with an underlying  medical history of diabetes with poor glycemic control and evidence of gastroparesis, hypertension, breast cancer, cholelithiasis, history of stroke in 2006 and Bell's palsy and trigeminal neuralgia. She has ongoing R facial weakness and mild residual R sided weakness from her prior stroke. She has had improvement in her HA with Elavil 25 mg and improvement in her R facial pain with Tegretol. She is at this point advised that sometimes patients do have residual weakness from stroke and also residual weakness from Bell's palsy. She is already using eye lubricant drops as well as an eye patch. She is advised to use the eye patch regularly  at night because of issues with possible eye dryness overnight. I would recommend that she continue Tegretol 100 mg 3 times a day and Elavil 25 mg at night and would like to suggest that she streamline her medications with you. She may not need the Lyrica in addition to the Tegretol. I am not sure if she is actually getting the Lyrica throughout the day, her daughter indicates that she gets Lyrica 50 mg once daily when the daughter gives it to her at 2 PM. Unfortunately there is no additional medication that would potentially help her Bell's palsy and I explained that to the patient and her daughter. She is currently and [facility] and is receiving therapy. I suggested no changes in her medical management otherwise. She is taking a baby aspirin for stroke secondary prevention. We did talk about strict glucose control as part of secondary stroke prevention as well. As far as her anxiety and depression, she will discuss treatment options with you. I answered all her questions today and the patient and her daughter were in agreement with the above outlined plan. I can see the patient back as needed.    Thank you very much for allowing me to participate in the care of this nice patient. If I can be of any further assistance to you please do not hesitate to call me at  [phone].  Sincerely,   Star Age, MD, PhD

## 2014-04-10 ENCOUNTER — Other Ambulatory Visit: Payer: Self-pay | Admitting: *Deleted

## 2014-04-14 ENCOUNTER — Ambulatory Visit: Payer: Self-pay | Admitting: Hematology and Oncology

## 2014-04-17 ENCOUNTER — Other Ambulatory Visit: Payer: Self-pay

## 2014-04-17 ENCOUNTER — Other Ambulatory Visit: Payer: Self-pay | Admitting: *Deleted

## 2014-04-17 ENCOUNTER — Ambulatory Visit: Payer: Self-pay | Admitting: Hematology and Oncology

## 2014-04-17 MED ORDER — METOCLOPRAMIDE HCL 10 MG PO TABS: 10.0000 mg | ORAL_TABLET | Freq: Three times a day (TID) | ORAL | Status: DC

## 2014-04-18 ENCOUNTER — Telehealth: Payer: Self-pay | Admitting: Hematology and Oncology

## 2014-04-18 ENCOUNTER — Other Ambulatory Visit: Payer: No Typology Code available for payment source

## 2014-04-18 ENCOUNTER — Ambulatory Visit (HOSPITAL_BASED_OUTPATIENT_CLINIC_OR_DEPARTMENT_OTHER): Payer: No Typology Code available for payment source

## 2014-04-18 VITALS — BP 160/98 | HR 74 | Temp 98.6°F | Resp 26 | Ht 58.5 in | Wt 154.2 lb

## 2014-04-18 DIAGNOSIS — Z0000 Encounter for general adult medical examination without abnormal findings: Secondary | ICD-10-CM

## 2014-04-18 LAB — LIPID PANEL
CHOL/HDL RATIO: 3.3 ratio
Cholesterol: 155 mg/dL (ref 0–200)
HDL: 47 mg/dL (ref >39)
LDL CALC: 82 mg/dL (ref 0–99)
Triglycerides: 129 mg/dL (ref <150)
VLDL: 26 mg/dL (ref 0–40)

## 2014-04-18 LAB — GLUCOSE (CC13): Glucose: 202 mg/dl — ABNORMAL HIGH (ref 70–140)

## 2014-04-18 LAB — HEMOGLOBIN A1C
Hgb A1c MFr Bld: 6.8 % — ABNORMAL HIGH (ref <5.7)
Mean Plasma Glucose: 148 mg/dL — ABNORMAL HIGH (ref <117)

## 2014-04-18 NOTE — Progress Notes (Signed)
Patient is a new patient to the [facility] program and is currently a [facility] patient effective 09/03/2013 and presents with son an interpreter.   Clinical Measurements: Patient is 4 ft. 10.5 inches, weight 154.2 lbs, waist circumference 34.5 inches, and hip circumference 42 inches.   Medical History: Patient states has a history of high cholesterol and doesn't take any medications for it. Patient does have a history of hypertension and takes HCTZ and Losartan daily. Patient states has been taking every day.  Per patient has history of diabetes and takes metformin and glimepiride.  Per patient no diagnosed history of coronary heart disease, heart attack, heart failure, stroke/TIA, vascular disease or congenital heart defects. Per chart has Hx of stroke and TIA. Patient states that is having a lot of trouble with her memory.  Blood Pressure, Self-measurement: Patient states has been shown how to take BP and takes it when has a headache. Per patient does not share results with doctor.  Nutrition Assessment: Patient states doesn't eat fruit daily but does eat about two times a week. Patient states she eats vegetables four times a week.Patient states does eat 3 or more ounces of whole grains daily. Patient doesn't eat two or more servings of fish weekly. Per patient does not like fish.Patient does not drink more than 36 ounces or 450 calories of beverages with added sugars weekly. Patient stated she tries to watch her salt intake.   Physical Activity Assessment: Patient stated she does around 840 minutes of moderate exercise weekly including walking and household chores. Patient stated she does not do any vigorous physical activity.   Smoking Status: Patient has never smoked and is not around smoke. .  Quality of Life Assessment: In assessing patient's physical quality of life she stated that out of the past 30 days that she has felt her health is not good all of them. Patient also stated that in the past  30 days that her mental health is not good including stress, depression and problems with emotions.  Patient did state that out of the past 30 days that she felt her physical or mental health had not kept her from doing her usual activities including self-care, work or recreation.   Plan: Lab work will be done today including a lipid panel, glucose and Hgb A1C. Will schedule health coaching for nutrition and activity.Will call lab results per [facility] June 2.

## 2014-04-18 NOTE — Telephone Encounter (Signed)
s.w.l pt daughter and advised on June appt....ok and aware

## 2014-04-18 NOTE — Patient Instructions (Signed)
Discussed health assessment with patient. She will be called with results of lab work and we will then discussed any further follow up the patient needs. Try to eat more vegetables. Patient verbalized understanding.

## 2014-04-22 ENCOUNTER — Other Ambulatory Visit: Payer: Self-pay

## 2014-04-22 MED ORDER — METOCLOPRAMIDE HCL 10 MG PO TABS: 10.0000 mg | ORAL_TABLET | Freq: Three times a day (TID) | ORAL | Status: DC

## 2014-04-28 ENCOUNTER — Telehealth: Payer: Self-pay

## 2014-04-28 NOTE — Telephone Encounter (Signed)
Called to inform about lab work from 5/29 by interpreter.  told patient: cholesterol- 155, HDL- 47, LDL- 82, triglycerides - 129, Bld Glucose -202 and HBG-AiC - 6.8.   Informed her that A1C was very good. Would like to have her come in for Health Coaching about her diabetic diet.  PLAN: Return in one month for health coaching on diabetes.

## 2014-05-01 ENCOUNTER — Other Ambulatory Visit: Payer: Self-pay | Admitting: Emergency Medicine

## 2014-05-01 MED ORDER — METFORMIN HCL 850 MG PO TABS: 425.0000 mg | ORAL_TABLET | Freq: Two times a day (BID) | ORAL | Status: DC

## 2014-05-01 MED ORDER — LOSARTAN POTASSIUM 25 MG PO TABS: 25.0000 mg | ORAL_TABLET | Freq: Every day | ORAL | Status: DC

## 2014-05-06 ENCOUNTER — Ambulatory Visit: Payer: Self-pay | Admitting: Hematology and Oncology

## 2014-05-28 ENCOUNTER — Encounter: Payer: Self-pay | Admitting: Internal Medicine

## 2014-05-28 ENCOUNTER — Ambulatory Visit: Payer: Self-pay | Attending: Internal Medicine | Admitting: Internal Medicine

## 2014-05-28 VITALS — BP 164/88 | HR 66 | Temp 98.7°F | Resp 16 | Wt 150.2 lb

## 2014-05-28 DIAGNOSIS — E119 Type 2 diabetes mellitus without complications: Secondary | ICD-10-CM | POA: Insufficient documentation

## 2014-05-28 DIAGNOSIS — E139 Other specified diabetes mellitus without complications: Secondary | ICD-10-CM

## 2014-05-28 DIAGNOSIS — H538 Other visual disturbances: Secondary | ICD-10-CM | POA: Insufficient documentation

## 2014-05-28 DIAGNOSIS — I10 Essential (primary) hypertension: Secondary | ICD-10-CM | POA: Insufficient documentation

## 2014-05-28 DIAGNOSIS — Z794 Long term (current) use of insulin: Secondary | ICD-10-CM | POA: Insufficient documentation

## 2014-05-28 DIAGNOSIS — E089 Diabetes mellitus due to underlying condition without complications: Secondary | ICD-10-CM

## 2014-05-28 LAB — GLUCOSE, POCT (MANUAL RESULT ENTRY): POC GLUCOSE: 295 mg/dL — AB (ref 70–99)

## 2014-05-28 MED ORDER — METFORMIN HCL 850 MG PO TABS: 850.0000 mg | ORAL_TABLET | Freq: Two times a day (BID) | ORAL | Status: DC

## 2014-05-28 MED ORDER — INSULIN ASPART 100 UNIT/ML ~~LOC~~ SOLN
10.0000 [IU] | Freq: Once | SUBCUTANEOUS | Status: AC
  Administered 2014-05-28: 10 [IU] via SUBCUTANEOUS

## 2014-05-28 MED ORDER — LOSARTAN POTASSIUM 50 MG PO TABS: 50.0000 mg | ORAL_TABLET | Freq: Every day | ORAL | Status: DC

## 2014-05-28 NOTE — Progress Notes (Signed)
MRN: [id number] Name: Brandi Bryant  Sex: female Age: 58 y.o. DOB: 27-Nov-1955  Allergies: Gadolinium derivatives  Chief Complaint  Patient presents with  . Follow-up    HPI: Patient is 58 y.o. female who has to of diabetes hypertension comes today for followup her, today her blood pressure is elevated denies any headache or dizziness, she also history of Bell's palsy following up with the neurologist, today her blood sugar is also elevated as per patient she has already eaten today, her last hemoglobin A1c was 6.8%, currently she's taking metformin one pill in the morning and half tablet at night, have advised patient to take 2 times a day and monitor fingerstick glucose at home. Patient also reported to have blurry vision.  Past Medical History  Diagnosis Date  . Breast cancer   . HTN (hypertension)   . Diabetes mellitus   . Cholelithiasis   . Hepatic steatosis   . Abdominal pain   . History of breast cancer 06/01/2012  . Arthralgia 08/13/2013  . Chest pain 09/30/2013  . Stroke 2006  . Bell's palsy     Past Surgical History  Procedure Laterality Date  . Tubal ligation    . Breast lumpectomy  2010      Medication List       This list is accurate as of: 05/28/14 11:23 AM.  Always use your most recent med list.               acetaminophen 500 MG tablet  Commonly known as:  TYLENOL  Take 1,000 mg by mouth 5 (five) times daily as needed (pain).     amitriptyline 25 MG tablet  Commonly known as:  ELAVIL  Take 1 tablet (25 mg total) by mouth at bedtime.     aspirin 81 MG chewable tablet  Chew by mouth daily.     B-complex with vitamin C tablet  Take 1 tablet by mouth daily.     butalbital-acetaminophen-caffeine 50-325-40 MG per tablet  Commonly known as:  FIORICET, ESGIC  Take 1-2 tablets by mouth every 4 (four) hours as needed for headache or migraine.     calcium-vitamin D 500-200 MG-UNIT per tablet  Commonly known as:  OSCAL WITH D  Take 1  tablet by mouth daily with breakfast.     carbamazepine 100 MG chewable tablet  Commonly known as:  TEGRETOL  Chew 1 tablet (100 mg total) by mouth 3 (three) times daily.     FLUoxetine 10 MG capsule  Commonly known as:  PROZAC  Take 1 capsule (10 mg total) by mouth daily.     glimepiride 4 MG tablet  Commonly known as:  AMARYL  Take 4 mg by mouth daily with breakfast.     hydrochlorothiazide 12.5 MG capsule  Commonly known as:  MICROZIDE  Take 1 capsule (12.5 mg total) by mouth daily.     ibuprofen 200 MG tablet  Commonly known as:  ADVIL,MOTRIN  Take 800 mg by mouth every 4 (four) hours as needed (pain).     ketorolac 10 MG tablet  Commonly known as:  TORADOL  Take 1 tablet (10 mg total) by mouth every 6 (six) hours as needed.     letrozole 2.5 MG tablet  Commonly known as:  FEMARA  Take 2.5 mg by mouth daily.     losartan 50 MG tablet  Commonly known as:  COZAAR  Take 1 tablet (50 mg total) by mouth daily.     metFORMIN 850 MG  tablet  Commonly known as:  GLUCOPHAGE  Take 1 tablet (850 mg total) by mouth 2 (two) times daily with a meal.     metoCLOPramide 10 MG tablet  Commonly known as:  REGLAN  Take 1 tablet (10 mg total) by mouth 3 (three) times daily before meals.     pregabalin 50 MG capsule  Commonly known as:  LYRICA  Take 1 capsule (50 mg total) by mouth 3 (three) times daily.        Meds ordered this encounter  Medications  . insulin aspart (novoLOG) injection 10 Units    Sig:     Per protocol  . metFORMIN (GLUCOPHAGE) 850 MG tablet    Sig: Take 1 tablet (850 mg total) by mouth 2 (two) times daily with a meal.    Dispense:  60 tablet    Refill:  3  . losartan (COZAAR) 50 MG tablet    Sig: Take 1 tablet (50 mg total) by mouth daily.    Dispense:  30 tablet    Refill:  3    There is no immunization history for the selected administration types on file for this patient.  Family History  Problem Relation Age of Onset  . Thyroid cancer Sister      thyroid  . Hypertension Mother   . Diabetes Mother   . Migraines Daughter     History  Substance Use Topics  . Smoking status: Never Smoker   . Smokeless tobacco: Not on file  . Alcohol Use: No    Review of Systems   As noted in HPI  Filed Vitals:   05/28/14 1053  BP: 164/88  Pulse: 66  Temp: 98.7 F (37.1 C)  Resp: 16    Physical Exam  Physical Exam  Constitutional: No distress.  Eyes: EOM are normal. Pupils are equal, round, and reactive to light.  Neck: Neck supple.  Cardiovascular: Normal rate and regular rhythm.   Pulmonary/Chest: Breath sounds normal. No respiratory distress. She has no wheezes. She has no rales.  Musculoskeletal: She exhibits no edema.    CBC    Component Value Date/Time   WBC 5.5 02/21/2014 0610   WBC 5.8 09/30/2013 1033   RBC 4.04 02/21/2014 0610   RBC 4.42 09/30/2013 1033   HGB 12.4 02/21/2014 0610   HGB 13.4 09/30/2013 1033   HCT 34.7* 02/21/2014 0610   HCT 39.1 09/30/2013 1033   PLT 191 02/21/2014 0610   PLT 150 09/30/2013 1033   MCV 85.9 02/21/2014 0610   MCV 88.5 09/30/2013 1033   LYMPHSABS 2.3 02/21/2014 0610   LYMPHSABS 2.1 09/30/2013 1033   MONOABS 0.5 02/21/2014 0610   MONOABS 0.4 09/30/2013 1033   EOSABS 0.3 02/21/2014 0610   EOSABS 0.2 09/30/2013 1033   BASOSABS 0.0 02/21/2014 0610   BASOSABS 0.0 09/30/2013 1033    CMP     Component Value Date/Time   NA 143 02/21/2014 0610   NA 139 09/30/2013 1033   K 3.7 02/21/2014 0610   K 4.2 09/30/2013 1033   CL 106 02/21/2014 0610   CL 107 01/28/2013 0854   CO2 21 02/21/2014 0610   CO2 22 09/30/2013 1033   GLUCOSE 202* 04/18/2014 0853   GLUCOSE 147* 02/21/2014 0610   GLUCOSE 243* 01/28/2013 0854   BUN 9 02/21/2014 0610   BUN 13.0 09/30/2013 1033   CREATININE 0.57 02/21/2014 0610   CREATININE 0.94 01/24/2014 1510   CREATININE 0.8 09/30/2013 1033   CALCIUM 8.8 02/21/2014 0610  CALCIUM 9.9 09/30/2013 1033   PROT 6.2 02/21/2014 0610   PROT 7.5 09/30/2013 1033   ALBUMIN 3.1* 02/21/2014 0610   ALBUMIN  3.7 09/30/2013 1033   AST 59* 02/21/2014 0610   AST 50* 09/30/2013 1033   ALT 35 02/21/2014 0610   ALT 45 09/30/2013 1033   ALKPHOS 73 02/21/2014 0610   ALKPHOS 108 09/30/2013 1033   BILITOT 0.5 02/21/2014 0610   BILITOT 0.89 09/30/2013 1033   GFRNONAA >90 02/21/2014 0610   GFRNONAA 68 01/24/2014 1510   GFRAA >90 02/21/2014 0610   GFRAA 78 01/24/2014 1510    Lab Results  Component Value Date/Time   CHOL 155 04/18/2014  8:55 AM    No components found with this basename: hga1c    Lab Results  Component Value Date/Time   AST 59* 02/21/2014  6:10 AM   AST 50* 09/30/2013 10:33 AM    Assessment and Plan  Diabetes mellitus due to underlying condition without complications - Plan:  Results for orders placed in visit on 05/28/14  GLUCOSE, POCT (MANUAL RESULT ENTRY)      Result Value Ref Range   POC Glucose 295 (*) 70 - 99 mg/dl   patient had already breakfast, she was given insulin 10 units, she's advised to take metformin one pill in the morning one in the evening, continue with fingerstick log, also advise for diabetes the 20   Glucose (CBG), insulin aspart (novoLOG) injection 10 Units, Ambulatory referral to Ophthalmology  Essential hypertension blood pressure is elevated, I have increased the dose of losartan 50 mg, also advise for DASH diet.   Blurry vision - Plan: Ambulatory referral to Ophthalmology   Return in about 3 months (around 08/28/2014) for diabetes, hypertension, BP check in 2 weeks/Nurse Visit.  [name], MD

## 2014-05-28 NOTE — Patient Instructions (Signed)
DASH Eating Plan DASH stands for "Dietary Approaches to Stop Hypertension." The DASH eating plan is a healthy eating plan that has been shown to reduce high blood pressure (hypertension). Additional health benefits may include reducing the risk of type 2 diabetes mellitus, heart disease, and stroke. The DASH eating plan may also help with weight loss. WHAT DO I NEED TO KNOW ABOUT THE DASH EATING PLAN? For the DASH eating plan, you will follow these general guidelines:  Choose foods with a percent daily value for sodium of less than 5% (as listed on the food label).  Use salt-free seasonings or herbs instead of table salt or sea salt.  Check with your health care provider or pharmacist before using salt substitutes.  Eat lower-sodium products, often labeled as "lower sodium" or "no salt added."  Eat fresh foods.  Eat more vegetables, fruits, and low-fat dairy products.  Choose whole grains. Look for the word "whole" as the first word in the ingredient list.  Choose fish and skinless chicken or turkey more often than red meat. Limit fish, poultry, and meat to 6 oz (170 g) each day.  Limit sweets, desserts, sugars, and sugary drinks.  Choose heart-healthy fats.  Limit cheese to 1 oz (28 g) per day.  Eat more home-cooked food and less restaurant, buffet, and fast food.  Limit fried foods.  Cook foods using methods other than frying.  Limit canned vegetables. If you do use them, rinse them well to decrease the sodium.  When eating at a restaurant, ask that your food be prepared with less salt, or no salt if possible. WHAT FOODS CAN I EAT? Seek help from a dietitian for individual calorie needs. Grains Whole grain or whole wheat bread. Brown rice. Whole grain or whole wheat pasta. Quinoa, bulgur, and whole grain cereals. Low-sodium cereals. Corn or whole wheat flour tortillas. Whole grain cornbread. Whole grain crackers. Low-sodium crackers. Vegetables Fresh or frozen vegetables  (raw, steamed, roasted, or grilled). Low-sodium or reduced-sodium tomato and vegetable juices. Low-sodium or reduced-sodium tomato sauce and paste. Low-sodium or reduced-sodium canned vegetables.  Fruits All fresh, canned (in natural juice), or frozen fruits. Meat and Other Protein Products Ground beef (85% or leaner), grass-fed beef, or beef trimmed of fat. Skinless chicken or turkey. Ground chicken or turkey. Pork trimmed of fat. All fish and seafood. Eggs. Dried beans, peas, or lentils. Unsalted nuts and seeds. Unsalted canned beans. Dairy Low-fat dairy products, such as skim or 1% milk, 2% or reduced-fat cheeses, low-fat ricotta or cottage cheese, or plain low-fat yogurt. Low-sodium or reduced-sodium cheeses. Fats and Oils Tub margarines without trans fats. Light or reduced-fat mayonnaise and salad dressings (reduced sodium). Avocado. Safflower, olive, or canola oils. Natural peanut or almond butter. Other Unsalted popcorn and pretzels. The items listed above may not be a complete list of recommended foods or beverages. Contact your dietitian for more options. WHAT FOODS ARE NOT RECOMMENDED? Grains White bread. White pasta. White rice. Refined cornbread. Bagels and croissants. Crackers that contain trans fat. Vegetables Creamed or fried vegetables. Vegetables in a cheese sauce. Regular canned vegetables. Regular canned tomato sauce and paste. Regular tomato and vegetable juices. Fruits Dried fruits. Canned fruit in light or heavy syrup. Fruit juice. Meat and Other Protein Products Fatty cuts of meat. Ribs, chicken wings, bacon, sausage, bologna, salami, chitterlings, fatback, hot dogs, bratwurst, and packaged luncheon meats. Salted nuts and seeds. Canned beans with salt. Dairy Whole or 2% milk, cream, half-and-half, and cream cheese. Whole-fat or sweetened yogurt. Full-fat   cheeses or blue cheese. Nondairy creamers and whipped toppings. Processed cheese, cheese spreads, or cheese  curds. Condiments Onion and garlic salt, seasoned salt, table salt, and sea salt. Canned and packaged gravies. Worcestershire sauce. Tartar sauce. Barbecue sauce. Teriyaki sauce. Soy sauce, including reduced sodium. Steak sauce. Fish sauce. Oyster sauce. Cocktail sauce. Horseradish. Ketchup and mustard. Meat flavorings and tenderizers. Bouillon cubes. Hot sauce. Tabasco sauce. Marinades. Taco seasonings. Relishes. Fats and Oils Butter, stick margarine, lard, shortening, ghee, and bacon fat. Coconut, palm kernel, or palm oils. Regular salad dressings. Other Pickles and olives. Salted popcorn and pretzels. The items listed above may not be a complete list of foods and beverages to avoid. Contact your dietitian for more information. WHERE CAN I FIND MORE INFORMATION? National Heart, Lung, and Blood Institute: travelstabloid.com Document Released: 10/27/2011 Document Revised: 11/12/2013 Document Reviewed: 09/11/2013 [employer] Patient Information 2015 [place], [place]. This information is not intended to replace advice given to you by your health care provider. Make sure you discuss any questions you have with your health care provider. La diabetes mellitus y los alimentos (Diabetes Mellitus and Food) Es importante que controle su nivel de azcar en la sangre (glucosa). El nivel de glucosa en sangre depende en gran medida de lo que usted come. Comer alimentos saludables en las cantidades [place] a lo largo del Training and development officer, aproximadamente a la misma hora [employer], lo ayudar a Chief Technology Officer su nivel de Multimedia programmer. Tambin puede ayudarlo a retrasar o Patent attorney de la diabetes mellitus. Comer de [employer] saludable incluso puede ayudarlo a Chartered loss adjuster de presin arterial y a Science writer o Theatre manager un peso saludable.  CMO PUEDEN AFECTARME LOS ALIMENTOS? Carbohidratos Los carbohidratos afectan el nivel de glucosa en sangre ms que cualquier otro tipo de  alimento. El nutricionista lo ayudar a Teacher, adult education cuntos carbohidratos puede consumir en cada comida y ensearle a contarlos. El recuento de carbohidratos es importante para mantener la glucosa en sangre en un nivel saludable, en especial si utiliza insulina o toma determinados medicamentos para la diabetes mellitus. Alcohol El alcohol puede provocar disminuciones sbitas de la glucosa en sangre (hipoglucemia), en especial si utiliza insulina o toma determinados medicamentos para la diabetes mellitus. La hipoglucemia es una afeccin que puede poner en peligro la vida. Los sntomas de la hipoglucemia (somnolencia, mareos y Data processing manager) son similares a los sntomas de haber consumido mucho alcohol.  Si el mdico lo autoriza a beber alcohol, hgalo con moderacin y siga estas pautas:  Las mujeres no deben beber ms de un trago por da, y los hombres no deben beber ms de dos tragos por Training and development officer. Un trago es igual a:  12 onzas (355 ml) de cerveza  5 onzas de vino (150 ml) de vino  1,5onzas (37ml) de bebidas espirituosas  No beba con el estmago vaco.  Mantngase hidratado. Beba agua, gaseosas dietticas o t helado sin azcar.  Las gaseosas comunes, los jugos y otros refrescos podran contener muchos carbohidratos y se Civil Service fast streamer. QU ALIMENTOS NO SE RECOMIENDAN? Cuando haga las elecciones de alimentos, es importante que recuerde que todos los alimentos son distintos. Algunos tienen menos nutrientes que otros por porcin, aunque podran tener la misma cantidad de caloras o carbohidratos. Es difcil darle al cuerpo lo que necesita cuando consume alimentos con menos nutrientes. Estos son algunos ejemplos de alimentos que debera evitar ya que contienen muchas caloras y carbohidratos, pero pocos nutrientes:  Physicist, medical trans (la mayora de los alimentos procesados incluyen grasas trans en la etiqueta de  Informacin nutricional).  Gaseosas comunes.  Jugos.  Caramelos.  Dulces, como tortas,  pasteles, rosquillas y [place].  Comidas fritas. QU ALIMENTOS PUEDO COMER? Consuma alimentos ricos en nutrientes, que nutrirn el cuerpo y lo mantendrn saludable. Los alimentos que debe comer tambin dependern de varios factores, como:  Las caloras que necesita.  Los medicamentos que toma.  Su peso.  El nivel de glucosa en [place].  El [place] de presin arterial.  El nivel de colesterol. Tambin debe consumir una variedad de [place], como:  Protenas, como carne, aves, pescado, tofu, frutos secos y semillas (las protenas de [place] magros son mejores).  Lambert Mody.  Verduras.  Productos lcteos, como [place], queso y yogur (descremados son mejores).  Panes, granos, pastas, cereales, arroz y frijoles.  Grasas, como aceite de [place], [place] sin grasas trans, aceite de canola, aguacate y [place]. TODOS LOS QUE PADECEN DIABETES MELLITUS TIENEN EL [place] PLAN DE [place]? Dado que todas las personas que padecen diabetes mellitus son distintas, no hay un solo plan de comidas que funcione para todos. Es muy importante que se rena con un nutricionista que lo ayudar a crear un plan de comidas adecuado para usted. Document Released: 02/14/2008 Document Revised: 11/12/2013 [facility] Patient Information 2015 [place]. This information is not intended to replace advice given to you by your health care provider. Make sure you discuss any questions you have with your health care provider.

## 2014-05-28 NOTE — Progress Notes (Signed)
Patient here for follow up on her DM 

## 2014-06-11 ENCOUNTER — Ambulatory Visit: Payer: No Typology Code available for payment source | Attending: Internal Medicine | Admitting: *Deleted

## 2014-06-11 VITALS — BP 131/83 | HR 82 | Resp 14

## 2014-06-11 DIAGNOSIS — I10 Essential (primary) hypertension: Secondary | ICD-10-CM

## 2014-06-11 NOTE — Patient Instructions (Signed)
Plan de alimentacin DASH (DASH Eating Plan) DASH es la sigla en ingls de "Enfoques alimentarios para bajar la hipertensin". El plan de alimentacin DASH ha demostrado bajar la presin arterial elevada (hipertensin). Los beneficios adicionales para la salud pueden incluir la disminucin del riesgo de diabetes mellitus tipo2, enfermedades cardacas e ictus. Este plan tambin puede ayudar a adelgazar. QU DEBO SABER ACERCA DEL PLAN DE ALIMENTACIN DASH? Para el plan de alimentacin DASH, seguir las siguientes pautas generales:  Elija los alimentos con un valor porcentual diario de sodio de menos del 5% (segn figura en la etiqueta del alimento).  Use hierbas o aderezos sin sal, en lugar de sal de mesa o sal marina.  Consulte al mdico o farmacutico antes de usar sustitutos de la sal.  Coma productos con bajo contenido de sodio, cuya etiqueta suele decir "bajo contenido de sodio" o "sin agregado de sal".  Coma alimentos frescos.  Coma ms verduras, frutas y productos lcteos con bajo contenido de grasas.  Elija los cereales integrales. Busque el trmino "integrales" como la segunda palabra en la lista de ingredientes.  Elija el pescado y el pollo o el pavo sin piel ms a menudo que las carnes rojas. Limite el consumo de pescado, carne de ave y carne a 6onzas (170g) por da.  Limite el consumo de dulces, postres, azcares y bebidas azucaradas.  Elija las grasas saludables para el corazn.  Limite el consumo de queso a 1oz (28g) por da.  Consuma ms alimentos caseros y menos comida rpida, de bufs o de restaurantes.  Limite el consumo de alimentos fritos.  Cocine los alimentos con otros mtodos que no sean la fritura.  Limite las verduras enlatadas. Si las consume, enjuguelas bien para disminuir el sodio.  Cuando coma en un restaurante, pida que preparen su comida con menos sal o, en lo posible, sin nada de sal. QU ALIMENTOS PUEDO COMER? Pida ayuda a un nutricionista  para conocer las necesidades calricas individuales. Cereales Pan de salvado o integral. Arroz integral. Pastas de salvado o integrales. Quinua, trigo burgol y cereales integrales. Cereales con bajo contenido de sodio. Tortillas de harina de maz o de salvado. Pan de maz integral. Galletas saladas integrales. Galletas con bajo contenido de sodio. Vegetales Verduras frescas o congeladas (crudas, al vapor, asadas o grilladas). Jugos de tomate y verduras con contenido bajo o reducido de sodio. Pasta y salsa de tomate con contenido bajo o reducido de sodio. Verduras enlatadas con bajo contenido de sodio o reducido de sodio.  Frutas Frutas frescas, en conserva (en su jugo natural) o frutas congeladas. Carnes y otras fuentes de protenas Carne de res molida (al 85% o ms magra), carne de res de animales alimentados con pastos o carne de res sin la grasa. Pollo o pavo sin piel. Carne de pollo o de pavo molida. Cerdo sin la grasa. Todos los pescados y frutos de mar. Huevos. Porotos, guisantes o lentejas secos. Frutos secos y semillas sin sal. Frijoles enlatados sin sal. Lcteos Productos lcteos con bajo contenido de grasas, como leche descremada o al 1%, quesos reducidos en grasas o al 2%, ricota con bajo contenido de grasas o queso cottage, o yogur natural con bajo contenido de grasas. Quesos con contenido bajo o reducido de sodio. Grasas y aceites Margarinas en barra que no contengan grasas trans. [place] y alios para ensaladas livianos o reducidos en grasas (reducidos en sodio). [place]. Aceites de crtamo, oliva o canola. [place] natural de man o almendra. Otros [place]om[place] de maz y pretzels sin   sal. Esta no es [employer] de los alimentos o las bebidas recomendados. Consulte a su nutricionista para conocer ms opciones. QU ALIMENTOS NO ESTN RECOMENDADOS? Cereales Pan blanco. Pastas blancas. Arroz blanco. Pan de maz refinado. Bagels y croissants. Galletas saladas que contengan  grasas trans. Vegetales Vegetales con crema o fritos. Verduras en [place]. Verduras enlatadas comunes. Pasta y salsa de tomate en lata comunes. Jugos comunes de tomate y de verduras. Lambert Mody Frutas secas. Fruta enlatada en almbar liviano o espeso. Jugo de frutas. Carnes y otras fuentes de protenas [place] de carne con Lobbyist. Costillas, alas de pollo, tocineta, salchicha, mortadela, salame, chinchulines, tocino, perros calientes, salchichas alemanas y embutidos envasados. Frutos secos y semillas con sal. Frijoles con sal en lata. Lcteos Leche entera o al 2%, crema, mezcla de [place] y crema y queso crema. Yogur entero o endulzado. Quesos o queso azul con alto contenido de Physicist, medical. [place]remas y coberturas batidas no lcteas. Quesos procesados, quesos para untar o cuajadas. Condimentos Sal de cebolla y ajo, sal condimentada, sal de mesa y sal marina. Salsas en lata y envasadas. Salsa Worcestershire. Salsa trtara. Salsa barbacoa. Salsa teriyaki. Salsa de soja, incluso la que tiene contenido reducido de [place]. Salsa de carne. Salsa de pescado. Salsa de [place]. Salsa rosada. Rbanos picantes. Ketchup y mostaza. Saborizantes y tiernizantes para carne. Caldo en cubitos. Salsa picante. Salsa tabasco. Adobos. Aderezos para tacos. Salsas. Grasas y aceites [place], [place] en barra, [place] de [place], [place], [place] clarificada y Wendee Copp de tocineta. Aceites de coco, de palmiste o de palma. Aderezos comunes para ensalada. Otros Pickles y [place]. Palomitas de maz y pretzels con sal. Esta no es [employer] de los alimentos y las bebidas que Nurse, adult. Consulte a su nutricionista para obtener ms informacin. DNDE Dolan Amen MS INFORMACIN? [place], [place] y Herbalist (National Heart, Lung, and [place]): travelstabloid.com Document Released: 10/27/2011 Document Revised: 11/12/2013 [facility] Patient Information 2015  [place], [place]. This information is not intended to replace advice given to you by your health care provider. Make sure you discuss any questions you have with your health care provider. Hipertensin (Hypertension) La hipertensin, conocida comnmente como presin arterial alta, se produce cuando la sangre bombea en las arterias con mucha fuerza. Las arterias son los vasos sanguneos que transportan la sangre desde el corazn hasta todas las partes del cuerpo. Una lectura de la presin arterial consiste en un nmero ms alto sobre un nmero ms bajo, por ejemplo, 110/72. El nmero ms alto (presin sistlica) corresponde a la presin interna de las arterias cuando el corazn [place]. El nmero ms bajo (presin diastlica) corresponde a la presin interna de las arterias cuando el corazn se relaja. En condiciones ideales, la presin arterial debe ser inferior a 120/80. La hipertensin fuerza al corazn a trabajar ms para Herbalist. Las arterias pueden estrecharse o ponerse rgidas. La hipertensin conlleva el riesgo de enfermedad cardaca, ictus y otros problemas.  [place] de riesgo de hipertensin son controlables, pero otros no lo son.  [employer] factores de riesgo que usted no puede Chief Technology Officer, se incluyen:   Manufacturing systems engineer. El riesgo es mayor para las Retail banker.  La edad. Los riesgos aumentan con la edad.  El sexo. Antes de los 45aos, los hombres corren ms [employer]. Despus de los 65aos, las mujeres corren ms [employer]. Entre los factores de riesgo que usted puede Chief Technology Officer, se incluyen:  No hacer la  cantidad suficiente de actividad fsica o ejercicio.  Tener sobrepeso.  Consumir mucha grasa, azcar, caloras o sal en la dieta.  Beber alcohol en exceso. SIGNOS Y SNTOMAS Por lo general, la hipertensin no causa signos o sntomas. La hipertensin demasiado alta (crisis de hipertensin) puede causar dolor de  cabeza, ansiedad, falta de aire y hemorragia nasal. DIAGNSTICO  Para detectar si usted tiene hipertensin, el mdico le medir la presin arterial mientras est sentado, con el brazo levantado a la altura del corazn. Debe medirla al [facility] veces en el mismo brazo. Determinadas condiciones pueden causar una diferencia de presin arterial entre el brazo izquierdo y Insurance underwriter. El hecho de tener en una ocasin una lectura de la presin arterial ms alta que lo normal no significa que necesita un tratamiento. En el caso de tener una lectura de la presin arterial con un valor alto, pdale al mdico que la verifique nuevamente. [place] hipertensin incluye hacer cambios en el estilo de vida y, posiblemente, tomar medicamentos. Un estilo de vida saludable puede ayudar a bajar la presin arterial. Quiz deba cambiar algunos hbitos. Los [employer] en el estilo de vida pueden incluir:  Seguir la dieta DASH. Esta dieta tiene un alto contenido de frutas, vegetales y Psychologist, prison and probation services. Incluye poca cantidad de sal, carnes rojas y azcares agregados.  Hacer al menos 21/2horas de actividad fsica enrgica todas las semanas.  Perder peso, si es necesario.  Dejar de fumar.  Limitar el consumo de bebidas alcohlicas.  Aprender formas de reducir el estrs. Si los cambios en el estilo de vida no son suficientes para Child psychotherapist la presin arterial, el mdico puede recetarle medicamentos. Quiz necesite tomar ms de uno. Trabaje en conjunto con su mdico para comprender los riesgos y los beneficios. INSTRUCCIONES PARA EL CUIDADO EN EL HOGAR  Haga que le midan de nuevo la presin arterial segn las indicaciones del [place] los medicamentos solamente como se lo haya indicado el mdico. Siga cuidadosamente las indicaciones. Los medicamentos para la presin arterial deben tomarse segn las indicaciones. Los medicamentos pierden eficacia al omitir las dosis. El hecho de  omitir las dosis tambin [place] el riesgo de otros problemas.  No fume.  Contrlese la presin arterial en su casa segn las indicaciones del mdico. SOLICITE ATENCIN MDICA SI:   Piensa que tiene una reaccin alrgica a los medicamentos.  Tiene mareos o dolores de cabeza con Scientist, research (physical sciences).  Tiene hinchazn en los tobillos.  Tiene problemas de visin. SOLICITE ATENCIN MDICA DE INMEDIATO SI:  Siente un dolor de cabeza intenso o confusin.  Tiene debilidad inusual, adormecimiento o la sensacin de que se Insurance account manager.  Siente dolor intenso en el pecho o en el abdomen.  Vomita repetidas veces.  Tiene dificultad para respirar. ASEGRESE DE QUE:   Comprende estas instrucciones.  Controlar su afeccin.  Recibir ayuda de inmediato si no mejora o si empeora. Document Released: 11/07/2005 Document Revised: 11/12/2013 [facility] Patient Information 2015 [place]. This information is not intended to replace advice given to you by your health care provider. Make sure you discuss any questions you have with your health care provider.

## 2014-07-22 ENCOUNTER — Other Ambulatory Visit: Payer: No Typology Code available for payment source

## 2014-07-29 ENCOUNTER — Ambulatory Visit: Payer: Self-pay | Admitting: Cardiology

## 2014-08-08 ENCOUNTER — Telehealth: Payer: Self-pay | Admitting: Hematology and Oncology

## 2014-08-08 NOTE — Telephone Encounter (Signed)
, °

## 2014-08-21 ENCOUNTER — Ambulatory Visit (HOSPITAL_BASED_OUTPATIENT_CLINIC_OR_DEPARTMENT_OTHER): Payer: No Typology Code available for payment source | Admitting: Hematology and Oncology

## 2014-08-21 ENCOUNTER — Encounter: Payer: Self-pay | Admitting: Hematology and Oncology

## 2014-08-21 VITALS — BP 150/84 | HR 79 | Temp 98.5°F | Resp 18 | Ht 58.5 in | Wt 147.6 lb

## 2014-08-21 DIAGNOSIS — I2510 Atherosclerotic heart disease of native coronary artery without angina pectoris: Secondary | ICD-10-CM

## 2014-08-21 DIAGNOSIS — Z853 Personal history of malignant neoplasm of breast: Secondary | ICD-10-CM

## 2014-08-21 DIAGNOSIS — Z299 Encounter for prophylactic measures, unspecified: Secondary | ICD-10-CM | POA: Insufficient documentation

## 2014-08-21 DIAGNOSIS — Z23 Encounter for immunization: Secondary | ICD-10-CM

## 2014-08-21 MED ORDER — INFLUENZA VAC SPLIT QUAD 0.5 ML IM SUSY
0.5000 mL | PREFILLED_SYRINGE | Freq: Once | INTRAMUSCULAR | Status: AC
  Administered 2014-08-21: 0.5 mL via INTRAMUSCULAR
  Filled 2014-08-21: qty 0.5

## 2014-08-21 NOTE — Progress Notes (Signed)
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 She had neoadjuvant chemotherapy with Cytoxan and Taxotere is status post lumpectomy with sentinel lymph node on 04/21/2009. She received adjuvant radiation therapy. She was on adjuvant hormonal therapy with tamoxifen started in August 2010. In March 2014: her treatment is switched to Femara. Femara is discontinued on 08/21/2014. INTERVAL HISTORY: Please see below for problem oriented charting. She denies recent chest pain. She denies any recent abnormal breast examination, palpable mass, abnormal breast appearance or nipple changes   REVIEW OF SYSTEMS:   Constitutional: Denies fevers, chills or abnormal weight loss Eyes: Denies blurriness of vision Ears, nose, mouth, throat, and face: Denies mucositis or sore throat Respiratory: Denies cough, dyspnea or wheezes Cardiovascular: Denies palpitation, chest discomfort or lower extremity swelling Gastrointestinal:  Denies nausea, heartburn or change in bowel habits Skin: Denies abnormal skin rashes Lymphatics: Denies new lymphadenopathy or easy bruising Neurological:Denies numbness, tingling or new weaknesses Behavioral/Psych: Mood is stable, no new changes  All other systems were reviewed with the patient and are negative.  I have reviewed the past medical history, past surgical history, social history and family history with the patient and they are unchanged from previous note.  ALLERGIES:  is allergic to gadolinium derivatives.  MEDICATIONS:  Current Outpatient Prescriptions  Medication Sig Dispense Refill  . acetaminophen (TYLENOL) 500 MG tablet Take 1,000 mg by  mouth 5 (five) times daily as needed (pain).      Marland Kitchen amitriptyline (ELAVIL) 25 MG tablet Take 1 tablet (25 mg total) by mouth at bedtime.  30 tablet  0  . aspirin 81 MG chewable tablet Chew by mouth daily.      . B Complex-C (B-COMPLEX WITH VITAMIN C) tablet Take 1 tablet by mouth daily.      . butalbital-acetaminophen-caffeine (FIORICET, ESGIC) 50-325-40 MG per tablet Take 1-2 tablets by mouth every 4 (four) hours as needed for headache or migraine.  14 tablet  0  . calcium-vitamin D (OSCAL WITH D) 500-200 MG-UNIT per tablet Take 1 tablet by mouth daily with breakfast.      . carbamazepine (TEGRETOL) 100 MG chewable tablet Chew 1 tablet (100 mg total) by mouth 3 (three) times daily.  90 tablet  0  . FLUoxetine (PROZAC) 10 MG capsule Take 1 capsule (10 mg total) by mouth daily.  30 capsule  0  . glimepiride (AMARYL) 4 MG tablet Take 4 mg by mouth daily with breakfast.      . hydrochlorothiazide (MICROZIDE) 12.5 MG capsule Take 1 capsule (12.5 mg total) by mouth daily.  30 capsule  0  . ibuprofen (ADVIL,MOTRIN) 200 MG tablet Take 800 mg by mouth every 4 (four) hours as needed (pain).      Marland Kitchen ketorolac (TORADOL) 10 MG tablet Take 1 tablet (10 mg total) by mouth every 6 (six) hours as needed.  20 tablet  0  . letrozole (FEMARA) 2.5 MG tablet Take 2.5 mg by mouth daily.      Marland Kitchen losartan (COZAAR) 50 MG tablet Take 1 tablet (50 mg total) by mouth daily.  30 tablet  3  . metFORMIN (GLUCOPHAGE) 850 MG tablet Take 1 tablet (850 mg total) by mouth 2 (two) times  daily with a meal.  60 tablet  3  . metoCLOPramide (REGLAN) 10 MG tablet Take 1 tablet (10 mg total) by mouth 3 (three) times daily before meals.  90 tablet  1  . pregabalin (LYRICA) 50 MG capsule Take 1 capsule (50 mg total) by mouth 3 (three) times daily.  60 capsule  6  . [DISCONTINUED] escitalopram (LEXAPRO) 10 MG tablet Take 10 mg by mouth daily.       No current facility-administered medications for this visit.    PHYSICAL EXAMINATION: ECOG  PERFORMANCE STATUS: 0 - Asymptomatic  Filed Vitals:   08/21/14 1259  BP: 150/84  Pulse: 79  Temp: 98.5 F (36.9 C)  Resp: 18   Filed Weights   08/21/14 1259  Weight: 147 lb 9.6 oz (66.951 kg)    GENERAL:alert, no distress and comfortable SKIN: skin color, texture, turgor are normal, no rashes or significant lesions EYES: normal, Conjunctiva are pink and non-injected, sclera clear OROPHARYNX:no exudate, no erythema and lips, buccal mucosa, and tongue normal  NECK: supple, thyroid normal size, non-tender, without nodularity LYMPH:  no palpable lymphadenopathy in the cervical, axillary or inguinal LUNGS: clear to auscultation and percussion with normal breathing effort HEART: regular rate & rhythm and no murmurs and no lower extremity edema ABDOMEN:abdomen soft, non-tender and normal bowel sounds Musculoskeletal:no cyanosis of digits and no clubbing  NEURO: alert & oriented x 3 with fluent speech, no focal motor/sensory deficits Bilateral breast examination revealed well-healed lumpectomy scar with no other abnormalities. LABORATORY DATA:  I have reviewed the data as listed    Component Value Date/Time   NA 143 02/21/2014 0610   NA 139 09/30/2013 1033   K 3.7 02/21/2014 0610   K 4.2 09/30/2013 1033   CL 106 02/21/2014 0610   CL 107 01/28/2013 0854   CO2 21 02/21/2014 0610   CO2 22 09/30/2013 1033   GLUCOSE 202* 04/18/2014 0853   GLUCOSE 147* 02/21/2014 0610   GLUCOSE 243* 01/28/2013 0854   BUN 9 02/21/2014 0610   BUN 13.0 09/30/2013 1033   CREATININE 0.57 02/21/2014 0610   CREATININE 0.94 01/24/2014 1510   CREATININE 0.8 09/30/2013 1033   CALCIUM 8.8 02/21/2014 0610   CALCIUM 9.9 09/30/2013 1033   PROT 6.2 02/21/2014 0610   PROT 7.5 09/30/2013 1033   ALBUMIN 3.1* 02/21/2014 0610   ALBUMIN 3.7 09/30/2013 1033   AST 59* 02/21/2014 0610   AST 50* 09/30/2013 1033   ALT 35 02/21/2014 0610   ALT 45 09/30/2013 1033   ALKPHOS 73 02/21/2014 0610   ALKPHOS 108 09/30/2013 1033   BILITOT 0.5 02/21/2014  0610   BILITOT 0.89 09/30/2013 1033   GFRNONAA >90 02/21/2014 0610   GFRNONAA 68 01/24/2014 1510   GFRAA >90 02/21/2014 0610   GFRAA 78 01/24/2014 1510    No results found for this basename: SPEP,  UPEP,   kappa and lambda light chains    Lab Results  Component Value Date   WBC 5.5 02/21/2014   NEUTROABS 2.4 02/21/2014   HGB 12.4 02/21/2014   HCT 34.7* 02/21/2014   MCV 85.9 02/21/2014   PLT 191 02/21/2014      Chemistry      Component Value Date/Time   NA 143 02/21/2014 0610   NA 139 09/30/2013 1033   K 3.7 02/21/2014 0610   K 4.2 09/30/2013 1033   CL 106 02/21/2014 0610   CL 107 01/28/2013 0854   CO2 21 02/21/2014 0610   CO2 22 09/30/2013 1033  BUN 9 02/21/2014 0610   BUN 13.0 09/30/2013 1033   CREATININE 0.57 02/21/2014 0610   CREATININE 0.94 01/24/2014 1510   CREATININE 0.8 09/30/2013 1033   GLU 569* 01/26/2009 1508      Component Value Date/Time   CALCIUM 8.8 02/21/2014 0610   CALCIUM 9.9 09/30/2013 1033   ALKPHOS 73 02/21/2014 0610   ALKPHOS 108 09/30/2013 1033   AST 59* 02/21/2014 0610   AST 50* 09/30/2013 1033   ALT 35 02/21/2014 0610   ALT 45 09/30/2013 1033   BILITOT 0.5 02/21/2014 0610   BILITOT 0.89 09/30/2013 1033      ASSESSMENT & PLAN:  History of breast cancer She has completed 5 years of adjuvant hormonal therapy. The patient once a discontinue treatment to undesirable side effects. I recommend yearly visit with history, physical examination and mammogram.  CAD (coronary artery disease), native coronary artery She is currently on aggressive medical management.  Preventive measure We discussed the importance of preventive care and reviewed the vaccination programs. She does not have any prior allergic reactions to influenza vaccination. She agrees to proceed with influenza vaccination today and we will administer it today at the clinic.     No orders of the defined types were placed in this encounter.   All questions were answered. The patient knows to call the clinic with any  problems, questions or concerns. No barriers to learning was detected. I spent 15 minutes counseling the patient face to face. The total time spent in the appointment was 20 minutes and more than 50% was on counseling and review of test results     [facility], [place], MD 08/21/2014 1:57 PM

## 2014-08-21 NOTE — Assessment & Plan Note (Signed)
She is currently on aggressive medical management.

## 2014-08-21 NOTE — Assessment & Plan Note (Signed)
She has completed 5 years of adjuvant hormonal therapy. The patient once a discontinue treatment to undesirable side effects. I recommend yearly visit with history, physical examination and mammogram.

## 2014-08-21 NOTE — Assessment & Plan Note (Signed)
We discussed the importance of preventive care and reviewed the vaccination programs. She does not have any prior allergic reactions to influenza vaccination. She agrees to proceed with influenza vaccination today and we will administer it today at the clinic.  

## 2014-08-22 ENCOUNTER — Telehealth: Payer: Self-pay | Admitting: Hematology and Oncology

## 2014-08-22 NOTE — Telephone Encounter (Signed)
vm full.....mailed pt appt sched/avs and letter °

## 2014-08-26 ENCOUNTER — Encounter: Payer: Self-pay | Admitting: *Deleted

## 2014-08-27 ENCOUNTER — Encounter (INDEPENDENT_AMBULATORY_CARE_PROVIDER_SITE_OTHER): Payer: No Typology Code available for payment source

## 2014-08-27 ENCOUNTER — Encounter: Payer: Self-pay | Admitting: Cardiology

## 2014-08-27 ENCOUNTER — Encounter: Payer: Self-pay | Admitting: Radiology

## 2014-08-27 ENCOUNTER — Ambulatory Visit (INDEPENDENT_AMBULATORY_CARE_PROVIDER_SITE_OTHER): Payer: No Typology Code available for payment source | Admitting: Cardiology

## 2014-08-27 VITALS — BP 138/78 | HR 74 | Ht 58.5 in | Wt 146.0 lb

## 2014-08-27 DIAGNOSIS — I10 Essential (primary) hypertension: Secondary | ICD-10-CM

## 2014-08-27 DIAGNOSIS — R002 Palpitations: Secondary | ICD-10-CM

## 2014-08-27 DIAGNOSIS — I25119 Atherosclerotic heart disease of native coronary artery with unspecified angina pectoris: Secondary | ICD-10-CM

## 2014-08-27 DIAGNOSIS — R079 Chest pain, unspecified: Secondary | ICD-10-CM

## 2014-08-27 MED ORDER — CARVEDILOL 6.25 MG PO TABS
6.2500 mg | ORAL_TABLET | Freq: Two times a day (BID) | ORAL | Status: DC
Start: 2014-08-27 — End: 2015-03-30

## 2014-08-27 MED ORDER — ATORVASTATIN CALCIUM 10 MG PO TABS
10.0000 mg | ORAL_TABLET | Freq: Every day | ORAL | Status: DC
Start: 2014-08-27 — End: 2015-03-30

## 2014-08-27 MED ORDER — LOSARTAN POTASSIUM 50 MG PO TABS: 50.0000 mg | ORAL_TABLET | Freq: Every day | ORAL | Status: DC

## 2014-08-27 NOTE — Progress Notes (Signed)
Patient ID: Brandi Bryant, female   DOB: 1956/09/01, 58 y.o.   MRN: [id number] EVO 48 hour holter monitor applied to patient.

## 2014-08-27 NOTE — Progress Notes (Signed)
Patient ID: Brandi Bryant, female   DOB: September 11, 1956, 58 y.o.   MRN: [id number]    Patient Name: Brandi Bryant Date of Encounter: 08/27/2014  Primary Care Provider:  [name], MD Primary Cardiologist:  Dorothy Spark  Problem List   Past Medical History  Diagnosis Date  . Breast cancer   . HTN (hypertension)   . Diabetes mellitus   . Cholelithiasis   . Hepatic steatosis   . Abdominal pain   . History of breast cancer 06/01/2012  . Arthralgia 08/13/2013  . Chest pain 09/30/2013  . Stroke 2006  . Bell's palsy    Past Surgical History  Procedure Laterality Date  . Tubal ligation    . Breast lumpectomy  2010   Allergies  Allergies  Allergen Reactions  . 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 She was on adjuvant hormonal therapy with tamoxifen started in August 2010. In March 2014: her treatment is switched to Femara. Her most recent CAT scan showed no evidence of metastatic disease. The same CT chest showed calcifications of her coronary vessels including left main and LAD.  The patient has been experiencing exertional dyspnea after walking a flight of stairs, that eventually progresses to chest pain on almost any exertion. This has been going on for last couple weeks. The patient describes her pain as someone sitting on her chest with radiation to her neck and palpitations. After she takes stress her pain resolves in about 10 minutes. She denies syncope orthopnea, paroxysmal nocturnal dyspnea, lower extremity edema. She has  never smoked. No significant family history of coronary artery disease.  She underwent a cardiac cath that showed moderate non-obstructive CAD. She reports improvement of symptoms. She was started on low dose atorvastatin considering her fatty lipids  She is complaining of palpitations that happen almost every day, they are associated with mild SOB. Mild dizziness, no syncope. No chest pain   Home Medications  Prior to Admission medications   Medication Sig Start Date End Date Taking? Authorizing Provider  ibuprofen (ADVIL,MOTRIN) 800 MG tablet Take 800 mg by mouth every 8 (eight) hours as needed for pain.   Yes Historical Provider, MD  letrozole (FEMARA) 2.5 MG tablet TAKE 1 TABLET BY MOUTH ONCE DAILY 09/30/13  Yes Heath Lark, MD  lisinopril-hydrochlorothiazide (PRINZIDE,ZESTORETIC) 10-12.5 MG per tablet Take 1 tablet by mouth daily.   Yes Historical Provider, MD  metFORMIN (GLUCOPHAGE) 500 MG tablet Take 500 mg by mouth 2 (two) times daily with a meal.   Yes Historical Provider, MD  Multiple Vitamins-Minerals (MULTIVITAMIN WITH MINERALS) tablet Take 1 tablet by mouth daily.   Yes Historical Provider, MD    Family History  Family History  Problem Relation Age of Onset  . Thyroid cancer Sister   . Hypertension Mother   . Diabetes Mother   . Migraines Daughter     Social History  History   Social History  . Marital Status: Single    Spouse Name: N/A    Number of Children: N/A  . Years of Education: N/A   Occupational History  .  Not on file.   Social History Main Topics  . Smoking status: Never Smoker   . Smokeless tobacco: Never Used  . Alcohol Use: No  . Drug Use: No  . Sexual Activity: Not Currently    Birth Control/ Protection: Surgical   Other Topics Concern  . Not on file   Social History Narrative  . No narrative on file     Review of Systems, as per HPI, otherwise negative General:  No chills, fever, night sweats or weight changes.  Cardiovascular:  No  chest pain, dyspnea on exertion, edema, orthopnea, palpitations, paroxysmal nocturnal dyspnea. Dermatological: No rash, lesions/masses Respiratory: No cough, dyspnea Urologic: No hematuria, dysuria Abdominal:   No nausea, vomiting, diarrhea, bright red blood per rectum, melena, or hematemesis Neurologic:  No visual changes, wkns, changes in mental status. All other systems reviewed and are otherwise negative except as noted above.  Physical Exam  BP 124/70, HR 74 General: Pleasant, in mild acute distress,  Psych: Normal affect. Neuro: Alert and oriented X 3. Moves all extremities spontaneously. 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 Lungs:  Resp regular and unlabored, CTA. Heart: RRR no s3, s4, or murmurs. Abdomen: Soft, non-tender, non-distended, BS + x 4.  Extremities: No clubbing, cyanosis or edema. DP/PT/Radials 2+ and equal bilaterally.  Labs:  No results found for this basename: CKTOTAL, CKMB, TROPONINI,  in the last 72 hours Lab Results  Component Value Date   WBC 5.5 02/21/2014   HGB 12.4 02/21/2014   HCT 34.7* 02/21/2014   MCV 85.9 02/21/2014   PLT 191 02/21/2014    No results found for this basename: NA, K, CL, CO2, BUN, CREATININE, CALCIUM, LABALBU, PROT, BILITOT, ALKPHOS, ALT, AST, GLUCOSE,  in the last 168 hours Lab Results  Component Value Date   CHOL 155 04/18/2014   HDL 47 04/18/2014   LDLCALC 82 04/18/2014   TRIG 129 04/18/2014   Accessory Clinical Findings  ECG - normal sinus rhythm, 74 beats per minutes, normal EKG  Cardiac cath 10/30/13  Hemodynamics:  AO 123 73  LV 133 14  Coronary angiography:  Coronary dominance: right  Left mainstem: Normal  Left anterior descending (LAD): 30% calcified disease proximally  D1: 30--40% mid vessel disease  D2: 50% mid vessel disease  D3: small normal  Left circumflex (LCx): Normal  OM1: large vessel normal  Right coronary artery (RCA):  Large dominant vessel normal  Left ventriculography: Left ventricular systolic function is normal, LVEF is estimated at 55-65%, there is no significant mitral regurgitation  Final Conclusions: Films reviewed with Dr [place] He agrees high diagonal vessels are not flow limiting Continue to Rx risk factors  Appears to have noncardiac chest pain  Recommendations: Medical Rx  Jenkins Rouge  10/30/2013, 12:24 PM     Assessment & Plan  58 year old female  1. Typical exertional chest pain - in a patient with multiple risk factors including diabetes, uncontrolled hypertension, prior radiation therapy, and evidence of coronary calcification on chest CT scan. She underwent a cardiac cath that showed moderate non-obstructive CAD.  The patient was started on 10 mg of atorvastatin, her TAG is 165 and LDL 86, goal <70, considering CAD and DM< she should be on moderate to high dose statin. AST and ALT are mildly elevated in the 50s, these can be blamed on her family were we will continue to closely monitor.  2. Palpitations - start carvedilol,  start 48 hour Holter monitor  3. Hypertension - add carvedilol 6.25 mg by mouth BID   4. Hyperlipidemia - as per 1.  5. diabetic neuropathy - improved with Lyrica 50 mg twice a day  Follow up in 1 month with liver enzymes and lipid profile prior to the visit.   Dorothy Spark, MD, [facility] 08/27/2014, 12:06 PM

## 2014-08-27 NOTE — Patient Instructions (Signed)
Your physician has recommended you make the following change in your medication:   START TAKING CARVEDILOL 6.25 MG TWICE DAILY  START TAKING LIPITOR 10 MG DAILY   Your physician has recommended that you wear a 48 HOUR holter monitor. Holter monitors are medical devices that record the heart's electrical activity. Doctors most often use these monitors to diagnose arrhythmias. Arrhythmias are problems with the speed or rhythm of the heartbeat. The monitor is a small, portable device. You can wear one while you do your normal daily activities. This is usually used to diagnose what is causing palpitations/syncope (passing out).  Your physician recommends that you schedule a follow-up appointment in: WITH DR [place]

## 2014-08-28 DIAGNOSIS — R002 Palpitations: Secondary | ICD-10-CM | POA: Insufficient documentation

## 2014-08-29 ENCOUNTER — Encounter: Payer: Self-pay | Admitting: Internal Medicine

## 2014-08-29 ENCOUNTER — Ambulatory Visit: Payer: No Typology Code available for payment source | Attending: Internal Medicine | Admitting: Internal Medicine

## 2014-08-29 ENCOUNTER — Telehealth: Payer: Self-pay | Admitting: Cardiology

## 2014-08-29 VITALS — BP 150/80 | HR 79 | Temp 98.0°F | Resp 17 | Wt 147.4 lb

## 2014-08-29 DIAGNOSIS — Z7982 Long term (current) use of aspirin: Secondary | ICD-10-CM | POA: Insufficient documentation

## 2014-08-29 DIAGNOSIS — Z808 Family history of malignant neoplasm of other organs or systems: Secondary | ICD-10-CM | POA: Insufficient documentation

## 2014-08-29 DIAGNOSIS — I10 Essential (primary) hypertension: Secondary | ICD-10-CM | POA: Insufficient documentation

## 2014-08-29 DIAGNOSIS — Z833 Family history of diabetes mellitus: Secondary | ICD-10-CM | POA: Insufficient documentation

## 2014-08-29 DIAGNOSIS — Z8673 Personal history of transient ischemic attack (TIA), and cerebral infarction without residual deficits: Secondary | ICD-10-CM | POA: Insufficient documentation

## 2014-08-29 DIAGNOSIS — I2510 Atherosclerotic heart disease of native coronary artery without angina pectoris: Secondary | ICD-10-CM | POA: Insufficient documentation

## 2014-08-29 DIAGNOSIS — Z853 Personal history of malignant neoplasm of breast: Secondary | ICD-10-CM | POA: Insufficient documentation

## 2014-08-29 DIAGNOSIS — E785 Hyperlipidemia, unspecified: Secondary | ICD-10-CM | POA: Insufficient documentation

## 2014-08-29 DIAGNOSIS — E139 Other specified diabetes mellitus without complications: Secondary | ICD-10-CM

## 2014-08-29 DIAGNOSIS — Z09 Encounter for follow-up examination after completed treatment for conditions other than malignant neoplasm: Secondary | ICD-10-CM | POA: Insufficient documentation

## 2014-08-29 DIAGNOSIS — E119 Type 2 diabetes mellitus without complications: Secondary | ICD-10-CM | POA: Insufficient documentation

## 2014-08-29 LAB — POCT GLYCOSYLATED HEMOGLOBIN (HGB A1C): Hemoglobin A1C: 8.7

## 2014-08-29 LAB — GLUCOSE, POCT (MANUAL RESULT ENTRY): POC Glucose: 341 mg/dl — AB (ref 70–99)

## 2014-08-29 MED ORDER — METFORMIN HCL 1000 MG PO TABS: 1000.0000 mg | ORAL_TABLET | Freq: Two times a day (BID) | ORAL | Status: DC

## 2014-08-29 NOTE — Patient Instructions (Signed)
DASH Eating Plan °DASH stands for "Dietary Approaches to Stop Hypertension." The DASH eating plan is a healthy eating plan that has been shown to reduce high blood pressure (hypertension). Additional health benefits may include reducing the risk of type 2 diabetes mellitus, heart disease, and stroke. The DASH eating plan may also help with weight loss. °WHAT DO I NEED TO KNOW ABOUT THE DASH EATING PLAN? °For the DASH eating plan, you will follow these general guidelines: °· Choose foods with a percent daily value for sodium of less than 5% (as listed on the food label). °· Use salt-free seasonings or herbs instead of table salt or sea salt. °· Check with your health care provider or pharmacist before using salt substitutes. °· Eat lower-sodium products, often labeled as "lower sodium" or "no salt added." °· Eat fresh foods. °· Eat more vegetables, fruits, and low-fat dairy products. °· Choose whole grains. Look for the word "whole" as the first word in the ingredient list. °· Choose fish and skinless chicken or turkey more often than red meat. Limit fish, poultry, and meat to 6 oz (170 g) each day. °· Limit sweets, desserts, sugars, and sugary drinks. °· Choose heart-healthy fats. °· Limit cheese to 1 oz (28 g) per day. °· Eat more home-cooked food and less restaurant, buffet, and fast food. °· Limit fried foods. °· Cook foods using methods other than frying. °· Limit canned vegetables. If you do use them, rinse them well to decrease the sodium. °· When eating at a restaurant, ask that your food be prepared with less salt, or no salt if possible. °WHAT FOODS CAN I EAT? °Seek help from a dietitian for individual calorie needs. °Grains °Whole grain or whole wheat bread. Brown rice. Whole grain or whole wheat pasta. Quinoa, bulgur, and whole grain cereals. Low-sodium cereals. Corn or whole wheat flour tortillas. Whole grain cornbread. Whole grain crackers. Low-sodium crackers. °Vegetables °Fresh or frozen vegetables  (raw, steamed, roasted, or grilled). Low-sodium or reduced-sodium tomato and vegetable juices. Low-sodium or reduced-sodium tomato sauce and paste. Low-sodium or reduced-sodium canned vegetables.  °Fruits °All fresh, canned (in natural juice), or frozen fruits. °Meat and Other Protein Products °Ground beef (85% or leaner), grass-fed beef, or beef trimmed of fat. Skinless chicken or turkey. Ground chicken or turkey. Pork trimmed of fat. All fish and seafood. Eggs. Dried beans, peas, or lentils. Unsalted nuts and seeds. Unsalted canned beans. °Dairy °Low-fat dairy products, such as skim or 1% milk, 2% or reduced-fat cheeses, low-fat ricotta or cottage cheese, or plain low-fat yogurt. Low-sodium or reduced-sodium cheeses. °Fats and Oils °Tub margarines without trans fats. Light or reduced-fat mayonnaise and salad dressings (reduced sodium). Avocado. Safflower, olive, or canola oils. Natural peanut or almond butter. °Other °Unsalted popcorn and pretzels. °The items listed above may not be a complete list of recommended foods or beverages. Contact your dietitian for more options. °WHAT FOODS ARE NOT RECOMMENDED? °Grains °White bread. White pasta. White rice. Refined cornbread. Bagels and croissants. Crackers that contain trans fat. °Vegetables °Creamed or fried vegetables. Vegetables in a cheese sauce. Regular canned vegetables. Regular canned tomato sauce and paste. Regular tomato and vegetable juices. °Fruits °Dried fruits. Canned fruit in light or heavy syrup. Fruit juice. °Meat and Other Protein Products °Fatty cuts of meat. Ribs, chicken wings, bacon, sausage, bologna, salami, chitterlings, fatback, hot dogs, bratwurst, and packaged luncheon meats. Salted nuts and seeds. Canned beans with salt. °Dairy °Whole or 2% milk, cream, half-and-half, and cream cheese. Whole-fat or sweetened yogurt. Full-fat   cheeses or blue cheese. Nondairy creamers and whipped toppings. Processed cheese, cheese spreads, or cheese  curds. °Condiments °Onion and garlic salt, seasoned salt, table salt, and sea salt. Canned and packaged gravies. Worcestershire sauce. Tartar sauce. Barbecue sauce. Teriyaki sauce. Soy sauce, including reduced sodium. Steak sauce. Fish sauce. Oyster sauce. Cocktail sauce. Horseradish. Ketchup and mustard. Meat flavorings and tenderizers. Bouillon cubes. Hot sauce. Tabasco sauce. Marinades. Taco seasonings. Relishes. °Fats and Oils °Butter, stick margarine, lard, shortening, ghee, and bacon fat. Coconut, palm kernel, or palm oils. Regular salad dressings. °Other °Pickles and olives. Salted popcorn and pretzels. °The items listed above may not be a complete list of foods and beverages to avoid. Contact your dietitian for more information. °WHERE CAN I FIND MORE INFORMATION? °[employer], [employer], [employer]: www.nhlbi.nih.gov/health/health-topics/topics/dash/ °Document Released: 10/27/2011 Document Revised: 03/24/2014 Document Reviewed: 09/11/2013 °[employer]® Patient Information ©2015 [employer]. This information is not intended to replace advice given to you by your health care provider. Make sure you discuss any questions you have with your health care provider. ° °

## 2014-08-29 NOTE — Progress Notes (Signed)
MRN: [id number] Name: Brandi Bryant  Sex: female Age: 58 y.o. DOB: 07/05/1956  Allergies: Gadolinium derivatives  Chief Complaint  Patient presents with  . Follow-up    HPI: Patient is 58 y.o. female who has history of hypertension CAD diabetes comes today for followup, she denies any hypoglycemic symptoms her hemoglobin A1c has trended up compared to last visit her, currently she's taking metformin 850 mg twice a day, she also recently followed with her cardiologist and was put on Holter monitor, since she had symptoms of palpitation and chest pain, currently denies any acute symptoms, she was also prescribed her Coreg her and her Lipitor by her cardiologist which as per patient she has not started taking medication yet.  Past Medical History  Diagnosis Date  . Breast cancer   . HTN (hypertension)   . Diabetes mellitus   . Cholelithiasis   . Hepatic steatosis   . Abdominal pain   . History of breast cancer 06/01/2012  . Arthralgia 08/13/2013  . Chest pain 09/30/2013  . Stroke 2006  . Bell's palsy     Past Surgical History  Procedure Laterality Date  . Tubal ligation    . Breast lumpectomy  2010      Medication List       This list is accurate as of: 08/29/14  2:41 PM.  Always use your most recent med list.               aspirin 81 MG chewable tablet  Chew by mouth daily.     atorvastatin 10 MG tablet  Commonly known as:  LIPITOR  Take 1 tablet (10 mg total) by mouth daily.     carvedilol 6.25 MG tablet  Commonly known as:  COREG  Take 1 tablet (6.25 mg total) by mouth 2 (two) times daily with a meal.     ibuprofen 200 MG tablet  Commonly known as:  ADVIL,MOTRIN  Take 800 mg by mouth every 4 (four) hours as needed (pain).     losartan 50 MG tablet  Commonly known as:  COZAAR  Take 1 tablet (50 mg total) by mouth daily.     metFORMIN 1000 MG tablet  Commonly known as:  GLUCOPHAGE  Take 1 tablet (1,000 mg total) by mouth 2 (two) times daily  with a meal.     pregabalin 50 MG capsule  Commonly known as:  LYRICA  Take 1 capsule (50 mg total) by mouth 3 (three) times daily.        Meds ordered this encounter  Medications  . metFORMIN (GLUCOPHAGE) 1000 MG tablet    Sig: Take 1 tablet (1,000 mg total) by mouth 2 (two) times daily with a meal.    Dispense:  60 tablet    Refill:  3    Immunization History  Administered Date(s) Administered  . Influenza,inj,Quad PF,36+ Mos 08/21/2014    Family History  Problem Relation Age of Onset  . Thyroid cancer Sister   . Hypertension Mother   . Diabetes Mother   . Migraines Daughter     History  Substance Use Topics  . Smoking status: Never Smoker   . Smokeless tobacco: Never Used  . Alcohol Use: No    Review of Systems   As noted in HPI  Filed Vitals:   08/29/14 1432  BP: 150/80  Pulse:   Temp:   Resp:     Physical Exam  Physical Exam  Constitutional: No distress.  Eyes: EOM are normal. Pupils  are equal, round, and reactive to light.  Cardiovascular: Normal rate and regular rhythm.   Pulmonary/Chest: Breath sounds normal.  Patient has Holter monitor   Musculoskeletal: She exhibits no edema.    CBC    Component Value Date/Time   WBC 5.5 02/21/2014 0610   WBC 5.8 09/30/2013 1033   RBC 4.04 02/21/2014 0610   RBC 4.42 09/30/2013 1033   HGB 12.4 02/21/2014 0610   HGB 13.4 09/30/2013 1033   HCT 34.7* 02/21/2014 0610   HCT 39.1 09/30/2013 1033   PLT 191 02/21/2014 0610   PLT 150 09/30/2013 1033   MCV 85.9 02/21/2014 0610   MCV 88.5 09/30/2013 1033   LYMPHSABS 2.3 02/21/2014 0610   LYMPHSABS 2.1 09/30/2013 1033   MONOABS 0.5 02/21/2014 0610   MONOABS 0.4 09/30/2013 1033   EOSABS 0.3 02/21/2014 0610   EOSABS 0.2 09/30/2013 1033   BASOSABS 0.0 02/21/2014 0610   BASOSABS 0.0 09/30/2013 1033    CMP     Component Value Date/Time   NA 143 02/21/2014 0610   NA 139 09/30/2013 1033   K 3.7 02/21/2014 0610   K 4.2 09/30/2013 1033   CL 106 02/21/2014 0610   CL 107  01/28/2013 0854   CO2 21 02/21/2014 0610   CO2 22 09/30/2013 1033   GLUCOSE 202* 04/18/2014 0853   GLUCOSE 147* 02/21/2014 0610   GLUCOSE 243* 01/28/2013 0854   BUN 9 02/21/2014 0610   BUN 13.0 09/30/2013 1033   CREATININE 0.57 02/21/2014 0610   CREATININE 0.94 01/24/2014 1510   CREATININE 0.8 09/30/2013 1033   CALCIUM 8.8 02/21/2014 0610   CALCIUM 9.9 09/30/2013 1033   PROT 6.2 02/21/2014 0610   PROT 7.5 09/30/2013 1033   ALBUMIN 3.1* 02/21/2014 0610   ALBUMIN 3.7 09/30/2013 1033   AST 59* 02/21/2014 0610   AST 50* 09/30/2013 1033   ALT 35 02/21/2014 0610   ALT 45 09/30/2013 1033   ALKPHOS 73 02/21/2014 0610   ALKPHOS 108 09/30/2013 1033   BILITOT 0.5 02/21/2014 0610   BILITOT 0.89 09/30/2013 1033   GFRNONAA >90 02/21/2014 0610   GFRNONAA 68 01/24/2014 1510   GFRAA >90 02/21/2014 0610   GFRAA 78 01/24/2014 1510    Lab Results  Component Value Date/Time   CHOL 155 04/18/2014  8:55 AM    No components found with this basename: hga1c    Lab Results  Component Value Date/Time   AST 59* 02/21/2014  6:10 AM   AST 50* 09/30/2013 10:33 AM    Assessment and Plan  Other specified diabetes mellitus without complications - Plan:  Results for orders placed in visit on 08/29/14  GLUCOSE, POCT (MANUAL RESULT ENTRY)      Result Value Ref Range   POC Glucose 341 (*) 70 - 99 mg/dl  POCT GLYCOSYLATED HEMOGLOBIN (HGB A1C)      Result Value Ref Range   Hemoglobin A1C 8.7     Hemoglobin A1c has trended up, I have advised patient for diabetes meal planning, also increase the dose of metformin to 1 g twice a day, she'll keep the fingerstick log and follow up in 3 months will repeat A1c metFORMIN (GLUCOPHAGE) 1000 MG tablet  Essential hypertension Blood pressure is borderline elevated, patient was recently prescribed Coreg which she has not started yet, advised for DASH diet.  Coronary artery disease involving native coronary artery of native heart without angina pectoris Currently on aspirin statin Coreg Cozaar.  Scheduled followup with her cardiologist and has a Holter monitor.  Hyperlipidemia  recently prescribed Lipitor which she has not started yet.  Health Maintenance   -Influenza shot uptodate   Return in about 3 months (around 11/29/2014) for diabetes, hypertension.  [name], MD

## 2014-08-29 NOTE — Progress Notes (Signed)
Patient here for follow up on her HTN and diabetes Patient had her flu vaccine at the cancer center

## 2014-08-29 NOTE — Telephone Encounter (Signed)
New Prob   Pt was expecting prescriptions at her local pharmacy. States medications are not available. Please call.

## 2014-08-29 NOTE — Telephone Encounter (Signed)
Contacted the pt and daughter to inform them that the pts medications are at [facility] and has been received and filled.  Informed both parties that the medication is ready to be picked up and has been there for since 10/7. Both verbalized understanding.

## 2014-09-08 ENCOUNTER — Ambulatory Visit (INDEPENDENT_AMBULATORY_CARE_PROVIDER_SITE_OTHER): Payer: No Typology Code available for payment source | Admitting: Cardiology

## 2014-09-08 ENCOUNTER — Encounter: Payer: Self-pay | Admitting: Cardiology

## 2014-09-08 VITALS — BP 112/56 | HR 84 | Ht <= 58 in | Wt 149.0 lb

## 2014-09-08 DIAGNOSIS — I2510 Atherosclerotic heart disease of native coronary artery without angina pectoris: Secondary | ICD-10-CM

## 2014-09-08 DIAGNOSIS — R059 Cough, unspecified: Secondary | ICD-10-CM

## 2014-09-08 DIAGNOSIS — J209 Acute bronchitis, unspecified: Secondary | ICD-10-CM | POA: Insufficient documentation

## 2014-09-08 DIAGNOSIS — R05 Cough: Secondary | ICD-10-CM

## 2014-09-08 LAB — CBC WITH DIFFERENTIAL/PLATELET
Basophils Absolute: 0 10*3/uL (ref 0.0–0.1)
Basophils Relative: 0.5 % (ref 0.0–3.0)
Eosinophils Absolute: 0.3 10*3/uL (ref 0.0–0.7)
Eosinophils Relative: 4.2 % (ref 0.0–5.0)
HCT: 41.6 % (ref 36.0–46.0)
Hemoglobin: 13.9 g/dL (ref 12.0–15.0)
Lymphocytes Relative: 28.6 % (ref 12.0–46.0)
Lymphs Abs: 1.9 10*3/uL (ref 0.7–4.0)
MCHC: 33.4 g/dL (ref 30.0–36.0)
MCV: 88.6 fl (ref 78.0–100.0)
Monocytes Absolute: 0.5 10*3/uL (ref 0.1–1.0)
Monocytes Relative: 8.3 % (ref 3.0–12.0)
Neutro Abs: 3.8 10*3/uL (ref 1.4–7.7)
Neutrophils Relative %: 58.4 % (ref 43.0–77.0)
Platelets: 177 10*3/uL (ref 150.0–400.0)
RBC: 4.7 Mil/uL (ref 3.87–5.11)
RDW: 12.9 % (ref 11.5–15.5)
WBC: 6.6 10*3/uL (ref 4.0–10.5)

## 2014-09-08 LAB — COMPREHENSIVE METABOLIC PANEL
ALT: 30 U/L (ref 0–35)
AST: 46 U/L — ABNORMAL HIGH (ref 0–37)
Albumin: 3.4 g/dL — ABNORMAL LOW (ref 3.5–5.2)
Alkaline Phosphatase: 116 U/L (ref 39–117)
BUN: 13 mg/dL (ref 6–23)
CO2: 22 mEq/L (ref 19–32)
Calcium: 9.6 mg/dL (ref 8.4–10.5)
Chloride: 100 mEq/L (ref 96–112)
Creatinine, Ser: 0.7 mg/dL (ref 0.4–1.2)
GFR: 99.71 mL/min (ref >60.00)
Glucose, Bld: 296 mg/dL — ABNORMAL HIGH (ref 70–99)
Potassium: 4.2 mEq/L (ref 3.5–5.1)
Sodium: 139 mEq/L (ref 135–145)
Total Bilirubin: 0.7 mg/dL (ref 0.2–1.2)
Total Protein: 8.1 g/dL (ref 6.0–8.3)

## 2014-09-08 MED ORDER — GUAIFENESIN-CODEINE 100-10 MG/5ML PO SOLN
5.0000 mL | Freq: Three times a day (TID) | ORAL | Status: DC | PRN
Start: 2014-09-08 — End: 2014-09-08

## 2014-09-08 MED ORDER — GUAIFENESIN-CODEINE 100-10 MG/5ML PO SOLN
5.0000 mL | Freq: Three times a day (TID) | ORAL | Status: DC | PRN
Start: 2014-09-08 — End: 2015-09-02

## 2014-09-08 MED ORDER — AZITHROMYCIN 250 MG PO TABS: ORAL_TABLET | ORAL | Status: DC

## 2014-09-08 MED ORDER — ALBUTEROL SULFATE HFA 108 (90 BASE) MCG/ACT IN AERS: 2.0000 | INHALATION_SPRAY | Freq: Four times a day (QID) | RESPIRATORY_TRACT | Status: DC | PRN

## 2014-09-08 NOTE — Patient Instructions (Addendum)
Your physician has recommended you make the following change in your medication:  1) START a 5 day Zpack 2) START Albuterol inhaler every 6 hours as needed for 1 week 3) START Guaifensin 3 times daily as needed for 1 week   Lab Today: Cmet, Cbc  Take all other medications as prescribed  Your physician wants you to follow-up in: 6 months You will receive a reminder letter in the mail two months in advance. If you don't receive a letter, please call our office to schedule the follow-up appointment.

## 2014-09-08 NOTE — Progress Notes (Signed)
Patient ID: Brandi Bryant, female   DOB: 1956/03/13, 58 y.o.   MRN: [id number] Patient ID: Brandi Bryant, female   DOB: March 03, 1956, 58 y.o.   MRN: [id number]    Patient Name: Brandi Bryant Date of Encounter: 09/08/2014  Primary Care Provider:  [name], MD Primary Cardiologist:  Dorothy Spark  Problem List   Past Medical History  Diagnosis Date  . Breast cancer   . HTN (hypertension)   . Diabetes mellitus   . Cholelithiasis   . Hepatic steatosis   . Abdominal pain   . History of breast cancer 06/01/2012  . Arthralgia 08/13/2013  . Chest pain 09/30/2013  . Stroke 2006  . Bell's palsy    Past Surgical History  Procedure Laterality Date  . Tubal ligation    . Breast lumpectomy  2010   Allergies  Allergies  Allergen Reactions  . 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 She was on adjuvant hormonal therapy with tamoxifen started in August 2010. In March 2014: her treatment is switched to Femara. Her most recent CAT scan showed no evidence of metastatic disease. The same CT chest showed calcifications of her coronary vessels including left main and LAD.  The patient has been experiencing exertional dyspnea after walking a flight of stairs, that eventually progresses to chest pain on almost any exertion. This has been going on for last couple weeks. The patient describes her pain as someone sitting on her chest with radiation to her neck and palpitations. After she takes stress her pain resolves in about 10 minutes.  She denies syncope orthopnea, paroxysmal nocturnal dyspnea, lower extremity edema. She has never smoked. No significant family history of coronary artery disease. She underwent a cardiac cath that showed moderate non-obstructive CAD. She reports improvement of symptoms. She was started on low dose atorvastatin considering her fatty lipids She is complaining of palpitations that happen almost every day, they are associated with mild SOB. Mild dizziness, no syncope. No chest pain  09/08/2014 - the patient states that her palpitations have improved. She has developed cough 2 weeks ago and is persistent and almost constant. It is non-productive cough. No chest pain. She has also experienced fever/chills.   Home Medications  Prior to Admission medications   Medication Sig Start Date End Date Taking? Authorizing Provider  ibuprofen (ADVIL,MOTRIN) 800 MG tablet Take 800 mg by mouth every 8 (eight) hours as needed for pain.   Yes Historical Provider, MD  letrozole (FEMARA) 2.5 MG tablet TAKE 1 TABLET BY MOUTH ONCE DAILY 09/30/13  Yes Heath Lark, MD  lisinopril-hydrochlorothiazide (PRINZIDE,ZESTORETIC) 10-12.5 MG per tablet Take 1 tablet by mouth daily.   Yes Historical Provider, MD  metFORMIN (GLUCOPHAGE) 500 MG tablet Take 500 mg by mouth 2 (two) times daily with a meal.   Yes Historical Provider, MD  Multiple Vitamins-Minerals (MULTIVITAMIN WITH MINERALS) tablet Take 1 tablet by mouth daily.   Yes Historical Provider, MD    Family History  Family History  Problem Relation Age of Onset  . Thyroid cancer Sister   . Hypertension Mother   .  Diabetes Mother   . Migraines Daughter     Social History  History   Social History  . Marital Status: Single    Spouse Name: N/A    Number of Children: N/A  . Years of Education: N/A   Occupational History  . Not on file.   Social History Main Topics  . Smoking status: Never Smoker   . Smokeless tobacco: Never Used  . Alcohol Use: No  . Drug  Use: No  . Sexual Activity: Not Currently    Birth Control/ Protection: Surgical   Other Topics Concern  . Not on file   Social History Narrative  . No narrative on file     Review of Systems, as per HPI, otherwise negative General:  No chills, fever, night sweats or weight changes.  Cardiovascular:  No chest pain, dyspnea on exertion, edema, orthopnea, palpitations, paroxysmal nocturnal dyspnea. Dermatological: No rash, lesions/masses Respiratory: No cough, dyspnea Urologic: No hematuria, dysuria Abdominal:   No nausea, vomiting, diarrhea, bright red blood per rectum, melena, or hematemesis Neurologic:  No visual changes, wkns, changes in mental status. All other systems reviewed and are otherwise negative except as noted above.  Physical Exam  BP 124/70, HR 74 General: Pleasant, in mild acute distress,  Psych: Normal affect. Neuro: Alert and oriented X 3. Moves all extremities spontaneously. 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 Lungs:  Resp regular and unlabored, CTA. Heart: RRR no s3, s4, or murmurs. Abdomen: Soft, non-tender, non-distended, BS + x 4.  Extremities: No clubbing, cyanosis or edema. DP/PT/Radials 2+ and equal bilaterally.  Labs:  No results found for this basename: CKTOTAL, CKMB, TROPONINI,  in the last 72 hours Lab Results  Component Value Date   WBC 5.5 02/21/2014   HGB 12.4 02/21/2014   HCT 34.7* 02/21/2014   MCV 85.9 02/21/2014   PLT 191 02/21/2014    No results found for this basename: NA, K, CL, CO2, BUN, CREATININE, CALCIUM, LABALBU, PROT, BILITOT, ALKPHOS, ALT, AST, GLUCOSE,  in the last 168 hours Lab Results  Component Value Date   CHOL 155 04/18/2014   HDL 47 04/18/2014   LDLCALC 82 04/18/2014   TRIG 129 04/18/2014   Accessory Clinical Findings  ECG - normal sinus rhythm, 74 beats per minutes, normal EKG  Cardiac cath 10/30/13  Hemodynamics:  AO 123 73  LV 133 14   Coronary angiography:  Coronary dominance: right  Left mainstem: Normal  Left anterior descending (LAD): 30% calcified disease proximally  D1: 30--40% mid vessel disease  D2: 50% mid vessel disease  D3: small normal  Left circumflex (LCx): Normal  OM1: large vessel normal  Right coronary artery (RCA): Large dominant vessel normal  Left ventriculography: Left ventricular systolic function is normal, LVEF is estimated at 55-65%, there is no significant mitral regurgitation  Final Conclusions: Films reviewed with Dr Martinique He agrees high diagonal vessels are not flow limiting Continue to Rx risk factors  Appears to have noncardiac chest pain  Recommendations: Medical Rx  Jenkins Rouge  10/30/2013, 12:24 PM     Assessment & Plan  58 year old female  1. Typical exertional chest pain - in a patient with multiple risk factors including diabetes, uncontrolled hypertension, prior radiation therapy, and evidence of coronary calcification on chest CT scan. She underwent a cardiac cath that showed moderate non-obstructive CAD.  The patient was started on 10 mg of atorvastatin, her TAG  is 165 and LDL 86, goal <70, considering CAD and DM< she should be on moderate to high dose statin. AST and ALT are mildly elevated in the 50s, we will continue to closely monitor, repeat today..  2. Palpitations - improved after starting carvedilol, 48 hour Holter monitor showed only few PVCs.  3. Hypertension - controlled after adding carvedilol 6.25 mg by mouth BID   4. Hyperlipidemia - as per 1.  5. diabetic neuropathy - improved with Lyrica 50 mg twice a day  6. Cough - most probably bronchitis/atypical pneumonia - we will start Azithromycin Z pack, albuterol inhaler and guifenessine-codeine.   Follow up in 6 months.    Dorothy Spark, MD, [facility] 09/08/2014, 9:45 AM

## 2014-09-22 ENCOUNTER — Encounter: Payer: Self-pay | Admitting: Cardiology

## 2014-10-30 ENCOUNTER — Encounter (HOSPITAL_COMMUNITY): Payer: Self-pay | Admitting: Cardiovascular Disease

## 2015-01-29 ENCOUNTER — Encounter (HOSPITAL_COMMUNITY): Payer: Self-pay

## 2015-01-29 ENCOUNTER — Encounter (HOSPITAL_COMMUNITY): Payer: Self-pay | Admitting: *Deleted

## 2015-01-29 ENCOUNTER — Emergency Department (HOSPITAL_COMMUNITY): Payer: Self-pay

## 2015-01-29 ENCOUNTER — Emergency Department (INDEPENDENT_AMBULATORY_CARE_PROVIDER_SITE_OTHER)
Admission: EM | Admit: 2015-01-29 | Discharge: 2015-01-29 | Disposition: A | Discharge status: Nursing Home (Includes ICF) | Payer: Self-pay | Source: Home / Self Care | Attending: Family Medicine | Admitting: Family Medicine

## 2015-01-29 ENCOUNTER — Emergency Department (HOSPITAL_COMMUNITY)
Admission: EM | Admit: 2015-01-29 | Discharge: 2015-01-30 | Disposition: A | Discharge status: Home / Self Care | Payer: Self-pay | Attending: Emergency Medicine | Admitting: Emergency Medicine

## 2015-01-29 DIAGNOSIS — Z8673 Personal history of transient ischemic attack (TIA), and cerebral infarction without residual deficits: Secondary | ICD-10-CM | POA: Insufficient documentation

## 2015-01-29 DIAGNOSIS — R030 Elevated blood-pressure reading, without diagnosis of hypertension: Secondary | ICD-10-CM

## 2015-01-29 DIAGNOSIS — R059 Cough, unspecified: Secondary | ICD-10-CM

## 2015-01-29 DIAGNOSIS — I10 Essential (primary) hypertension: Secondary | ICD-10-CM

## 2015-01-29 DIAGNOSIS — R739 Hyperglycemia, unspecified: Secondary | ICD-10-CM

## 2015-01-29 DIAGNOSIS — R05 Cough: Secondary | ICD-10-CM | POA: Insufficient documentation

## 2015-01-29 DIAGNOSIS — R519 Headache, unspecified: Secondary | ICD-10-CM

## 2015-01-29 DIAGNOSIS — G8929 Other chronic pain: Secondary | ICD-10-CM | POA: Insufficient documentation

## 2015-01-29 DIAGNOSIS — E1165 Type 2 diabetes mellitus with hyperglycemia: Secondary | ICD-10-CM

## 2015-01-29 DIAGNOSIS — Z79899 Other long term (current) drug therapy: Secondary | ICD-10-CM | POA: Insufficient documentation

## 2015-01-29 DIAGNOSIS — Z9889 Other specified postprocedural states: Secondary | ICD-10-CM | POA: Insufficient documentation

## 2015-01-29 DIAGNOSIS — Z8719 Personal history of other diseases of the digestive system: Secondary | ICD-10-CM | POA: Insufficient documentation

## 2015-01-29 DIAGNOSIS — Z853 Personal history of malignant neoplasm of breast: Secondary | ICD-10-CM | POA: Insufficient documentation

## 2015-01-29 DIAGNOSIS — R51 Headache: Secondary | ICD-10-CM

## 2015-01-29 DIAGNOSIS — Z7982 Long term (current) use of aspirin: Secondary | ICD-10-CM | POA: Insufficient documentation

## 2015-01-29 DIAGNOSIS — R053 Chronic cough: Secondary | ICD-10-CM

## 2015-01-29 DIAGNOSIS — R062 Wheezing: Secondary | ICD-10-CM | POA: Insufficient documentation

## 2015-01-29 DIAGNOSIS — IMO0001 Reserved for inherently not codable concepts without codable children: Secondary | ICD-10-CM

## 2015-01-29 DIAGNOSIS — E860 Dehydration: Secondary | ICD-10-CM

## 2015-01-29 LAB — GLUCOSE, CAPILLARY: GLUCOSE-CAPILLARY: 318 mg/dL — AB (ref 70–99)

## 2015-01-29 LAB — CBC
HEMATOCRIT: 39.2 % (ref 36.0–46.0)
HEMOGLOBIN: 13.7 g/dL (ref 12.0–15.0)
MCH: 30.2 pg (ref 26.0–34.0)
MCHC: 34.9 g/dL (ref 30.0–36.0)
MCV: 86.3 fL (ref 78.0–100.0)
PLATELETS: 183 10*3/uL (ref 150–400)
RBC: 4.54 MIL/uL (ref 3.87–5.11)
RDW: 12.7 % (ref 11.5–15.5)
WBC: 6.4 10*3/uL (ref 4.0–10.5)

## 2015-01-29 LAB — CBG MONITORING, ED: GLUCOSE-CAPILLARY: 288 mg/dL — AB (ref 70–99)

## 2015-01-29 MED ORDER — HYDRALAZINE HCL 20 MG/ML IJ SOLN
10.0000 mg | Freq: Once | INTRAMUSCULAR | Status: AC
  Administered 2015-01-29: 10 mg via INTRAVENOUS
  Filled 2015-01-29: qty 1

## 2015-01-29 MED ORDER — SODIUM CHLORIDE 0.9 % IV BOLUS (SEPSIS)
1000.0000 mL | Freq: Once | INTRAVENOUS | Status: AC
  Administered 2015-01-29: 1000 mL via INTRAVENOUS

## 2015-01-29 MED ORDER — MORPHINE SULFATE 4 MG/ML IJ SOLN
4.0000 mg | INTRAMUSCULAR | Status: DC | PRN
  Administered 2015-01-29: 4 mg via INTRAVENOUS
  Filled 2015-01-29: qty 1

## 2015-01-29 MED ORDER — LOSARTAN POTASSIUM 25 MG PO TABS
25.0000 mg | ORAL_TABLET | Freq: Once | ORAL | Status: AC
  Administered 2015-01-30: 25 mg via ORAL
  Filled 2015-01-29: qty 1

## 2015-01-29 MED ORDER — ONDANSETRON HCL 4 MG/2ML IJ SOLN
4.0000 mg | Freq: Once | INTRAMUSCULAR | Status: AC
  Administered 2015-01-29: 4 mg via INTRAVENOUS
  Filled 2015-01-29: qty 2

## 2015-01-29 MED ORDER — METFORMIN HCL 500 MG PO TABS
1000.0000 mg | ORAL_TABLET | Freq: Once | ORAL | Status: AC
  Administered 2015-01-30: 1000 mg via ORAL
  Filled 2015-01-29: qty 2

## 2015-01-29 NOTE — ED Notes (Signed)
Pt here with reported hyperglycemia and hypertension.  Sts her CBGs have been in the 400s.  Sts she takes metformin and last had it a week ago.  Pt also with reported hypertension; has not had Losartan since one week ago.  Pt also with cough x 3 months.

## 2015-01-29 NOTE — ED Notes (Signed)
MD at bedside. 

## 2015-01-29 NOTE — ED Provider Notes (Signed)
CSN: [id number]     Arrival date & time 01/29/15  1911 History   First MD Initiated Contact with Patient 01/29/15 2044     Chief Complaint  Patient presents with  . Headache  . Hyperglycemia  . Cough   (Consider location/radiation/quality/duration/timing/severity/associated sxs/prior Treatment) HPI Comments: Patient presents requesting evaluation of elevated blood glucose (300-460) over past 4 weeks, elevated blood pressure x 4 weeks, intractable headache, dizziness, polyuria, polydipsia, and cough x 3 months.  States she ran out of blood pressure and diabetic medications about one month ago. Has not contacted her PCP. PCP: [place]  Patient is a 59 y.o. female presenting with headaches, hyperglycemia, and cough.  Headache Associated symptoms: cough and dizziness   Hyperglycemia Associated symptoms: dizziness, increased thirst and polyuria   Associated symptoms: no dysuria and no shortness of breath   Cough Associated symptoms: headaches   Associated symptoms: no shortness of breath     Past Medical History  Diagnosis Date  . Breast cancer   . HTN (hypertension)   . Diabetes mellitus   . Cholelithiasis   . Hepatic steatosis   . Abdominal pain   . History of breast cancer 06/01/2012  . Arthralgia 08/13/2013  . Chest pain 09/30/2013  . Stroke 2006  . Bell's palsy    Past Surgical History  Procedure Laterality Date  . Tubal ligation    . Breast lumpectomy  2010  . Left heart catheterization with coronary angiogram N/A 10/30/2013    Procedure: LEFT HEART CATHETERIZATION WITH CORONARY ANGIOGRAM;  Surgeon: Josue Hector, MD;  Location: [facility] CATH LAB;  Service: Cardiovascular;  Laterality: N/A;   Family History  Problem Relation Age of Onset  . Thyroid cancer Sister   . Hypertension Mother   . Diabetes Mother   . Migraines Daughter    History  Substance Use Topics  . Smoking status: Never Smoker   . Smokeless tobacco: Never Used  . Alcohol Use: No   OB History    Gravida  Para Term Preterm AB TAB SAB Ectopic Multiple Living   6 6 6       6      Review of Systems  Constitutional: Negative.   Eyes: Negative.   Respiratory: Positive for cough. Negative for chest tightness and shortness of breath.   Cardiovascular: Negative.   Gastrointestinal: Negative.   Endocrine: Positive for polydipsia and polyuria.  Genitourinary: Positive for urgency and frequency. Negative for dysuria, hematuria and flank pain.  Musculoskeletal: Negative.   Skin: Negative.   Neurological: Positive for dizziness, light-headedness and headaches. Negative for syncope.    Allergies  Gadolinium derivatives  Home Medications   Prior to Admission medications   Medication Sig Start Date End Date Taking? Authorizing Provider  albuterol (PROVENTIL HFA;VENTOLIN HFA) 108 (90 BASE) MCG/ACT inhaler Inhale 2 puffs into the lungs every 6 (six) hours as needed for wheezing or shortness of breath. For 1 week 09/08/14   Dorothy Spark, MD  aspirin 81 MG chewable tablet Chew by mouth daily.    Historical Provider, MD  atorvastatin (LIPITOR) 10 MG tablet Take 1 tablet (10 mg total) by mouth daily. 08/27/14   Dorothy Spark, MD  azithromycin (ZITHROMAX Z-PAK) 250 MG tablet 1 Tablet daily for 5 days 09/08/14   Dorothy Spark, MD  carvedilol (COREG) 6.25 MG tablet Take 1 tablet (6.25 mg total) by mouth 2 (two) times daily with a meal. 08/27/14   Dorothy Spark, MD  guaiFENesin-codeine 100-10 MG/5ML syrup Take 5  mLs by mouth 3 (three) times daily as needed for cough. 09/08/14   Dorothy Spark, MD  ibuprofen (ADVIL,MOTRIN) 200 MG tablet Take 800 mg by mouth every 4 (four) hours as needed (pain).    Historical Provider, MD  losartan (COZAAR) 50 MG tablet Take 1 tablet (50 mg total) by mouth daily. 08/27/14   Dorothy Spark, MD  metFORMIN (GLUCOPHAGE) 1000 MG tablet Take 1 tablet (1,000 mg total) by mouth 2 (two) times daily with a meal. 08/29/14   [name], MD  pregabalin (LYRICA) 50 MG  capsule Take 1 capsule (50 mg total) by mouth 3 (three) times daily. 01/30/14   Dorothy Spark, MD   BP 185/94 mmHg  Pulse 94  Temp(Src) 98.3 F (36.8 C) (Oral)  Resp 14  SpO2 98% Physical Exam  Constitutional: She is oriented to person, place, and time. She appears well-developed and well-nourished.  HENT:  Head: Normocephalic and atraumatic.  Eyes: Conjunctivae are normal. No scleral icterus.  Cardiovascular: Normal rate.   Pulmonary/Chest: Effort normal.  Abdominal: Soft. Bowel sounds are normal. She exhibits no distension. There is no tenderness.  Musculoskeletal: Normal range of motion.  Neurological: She is alert and oriented to person, place, and time. No cranial nerve deficit. Coordination and gait normal. GCS eye subscore is 4. GCS verbal subscore is 5. GCS motor subscore is 6.  Skin: Skin is warm and dry.  Psychiatric: She has a normal mood and affect. Her behavior is normal.  Nursing note and vitals reviewed.   ED Course  Procedures (including critical care time) Labs Review Labs Reviewed  GLUCOSE, CAPILLARY - Abnormal; Notable for the following:    Glucose-Capillary 318 (*)    All other components within normal limits    Imaging Review No results found.   MDM   1. Hyperglycemia due to type 2 diabetes mellitus   2. Elevated blood pressure   3. Intractable headache   4. Chronic cough   5. Dehydration   Transferred via shuttle to [facility] ER for further evaluation and treatment.     Lutricia Feil, [place] 01/29/15 2118

## 2015-01-29 NOTE — ED Notes (Signed)
Pt is here with complaints of elevated blood suger (states 470 CBG checked at home), headache X 1 month, and cough X 3 months. Pt states she started Losartan about 5 months ago.

## 2015-01-30 LAB — COMPREHENSIVE METABOLIC PANEL
ALT: 41 U/L — ABNORMAL HIGH (ref 0–35)
AST: 60 U/L — ABNORMAL HIGH (ref 0–37)
Albumin: 3.7 g/dL (ref 3.5–5.2)
Alkaline Phosphatase: 157 U/L — ABNORMAL HIGH (ref 39–117)
Anion gap: 8 (ref 5–15)
BILIRUBIN TOTAL: 0.7 mg/dL (ref 0.3–1.2)
BUN: 12 mg/dL (ref 6–23)
CO2: 27 mmol/L (ref 19–32)
CREATININE: 0.6 mg/dL (ref 0.50–1.10)
Calcium: 9.2 mg/dL (ref 8.4–10.5)
Chloride: 102 mmol/L (ref 96–112)
GFR calc Af Amer: >90 mL/min (ref >90)
Glucose, Bld: 283 mg/dL — ABNORMAL HIGH (ref 70–99)
Potassium: 3.4 mmol/L — ABNORMAL LOW (ref 3.5–5.1)
Sodium: 137 mmol/L (ref 135–145)
Total Protein: 7.3 g/dL (ref 6.0–8.3)

## 2015-01-30 LAB — URINALYSIS, ROUTINE W REFLEX MICROSCOPIC
BILIRUBIN URINE: NEGATIVE
GLUCOSE, UA: 500 mg/dL — AB
HGB URINE DIPSTICK: NEGATIVE
KETONES UR: NEGATIVE mg/dL
Nitrite: NEGATIVE
Protein, ur: NEGATIVE mg/dL
Specific Gravity, Urine: 1.01 (ref 1.005–1.030)
Urobilinogen, UA: 0.2 mg/dL (ref 0.0–1.0)
pH: 6.5 (ref 5.0–8.0)

## 2015-01-30 LAB — URINE MICROSCOPIC-ADD ON

## 2015-01-30 MED ORDER — LOSARTAN POTASSIUM 50 MG PO TABS: 50.0000 mg | ORAL_TABLET | Freq: Every day | ORAL | Status: DC

## 2015-01-30 MED ORDER — HYDROMORPHONE HCL 1 MG/ML IJ SOLN
1.0000 mg | Freq: Once | INTRAMUSCULAR | Status: AC
  Administered 2015-01-30: 1 mg via INTRAVENOUS
  Filled 2015-01-30: qty 1

## 2015-01-30 MED ORDER — ALBUTEROL SULFATE HFA 108 (90 BASE) MCG/ACT IN AERS: 1.0000 | INHALATION_SPRAY | Freq: Four times a day (QID) | RESPIRATORY_TRACT | Status: DC | PRN

## 2015-01-30 MED ORDER — METFORMIN HCL 1000 MG PO TABS: 1000.0000 mg | ORAL_TABLET | Freq: Two times a day (BID) | ORAL | Status: DC

## 2015-01-30 MED ORDER — HYDROCODONE-ACETAMINOPHEN 5-325 MG PO TABS: 2.0000 | ORAL_TABLET | ORAL | Status: DC | PRN

## 2015-01-30 NOTE — ED Provider Notes (Signed)
CSN: [id number]     Arrival date & time 01/29/15  2115 History   First MD Initiated Contact with Patient 01/29/15 2321     Chief Complaint  Patient presents with  . Hyperglycemia  . Hypertension      HPI  Impression brownish evaluation of high blood sugar and hypertension. His been off her medications for the last 10 days. Also had a cough for the last month and claims a headache for the last 2 days. Urinating frequently thirsty and dizzy lightheaded today she presents here.  Past Medical History  Diagnosis Date  . Breast cancer   . HTN (hypertension)   . Diabetes mellitus   . Cholelithiasis   . Hepatic steatosis   . Abdominal pain   . History of breast cancer 06/01/2012  . Arthralgia 08/13/2013  . Chest pain 09/30/2013  . Stroke 2006  . Bell's palsy    Past Surgical History  Procedure Laterality Date  . Tubal ligation    . Breast lumpectomy  2010  . Left heart catheterization with coronary angiogram N/A 10/30/2013    Procedure: LEFT HEART CATHETERIZATION WITH CORONARY ANGIOGRAM;  Surgeon: Josue Hector, MD;  Location: [facility] CATH LAB;  Service: Cardiovascular;  Laterality: N/A;   Family History  Problem Relation Age of Onset  . Thyroid cancer Sister   . Hypertension Mother   . Diabetes Mother   . Migraines Daughter    History  Substance Use Topics  . Smoking status: Never Smoker   . Smokeless tobacco: Never Used  . Alcohol Use: No   OB History    Gravida Para Term Preterm AB TAB SAB Ectopic Multiple Living   6 6 6       6      Review of Systems  Constitutional: Negative for fever, chills, diaphoresis, appetite change and fatigue.  HENT: Negative for mouth sores, sore throat and trouble swallowing.   Eyes: Negative for visual disturbance.  Respiratory: Positive for cough. Negative for chest tightness, shortness of breath and wheezing.   Cardiovascular: Negative for chest pain.  Gastrointestinal: Negative for nausea, vomiting, abdominal pain, diarrhea and abdominal  distention.  Endocrine: Positive for polydipsia, polyphagia and polyuria.  Genitourinary: Negative for dysuria, frequency and hematuria.  Musculoskeletal: Negative for gait problem.  Skin: Negative for color change, pallor and rash.  Neurological: Positive for headaches. Negative for dizziness, syncope and light-headedness.  Hematological: Does not bruise/bleed easily.  Psychiatric/Behavioral: Negative for behavioral problems and confusion.      Allergies  Gadolinium derivatives  Home Medications   Prior to Admission medications   Medication Sig Start Date End Date Taking? Authorizing Provider  albuterol (PROVENTIL HFA;VENTOLIN HFA) 108 (90 BASE) MCG/ACT inhaler Inhale 1-2 puffs into the lungs every 6 (six) hours as needed for wheezing. 01/30/15   Tanna Furry, MD  aspirin 81 MG chewable tablet Chew by mouth daily.    Historical Provider, MD  atorvastatin (LIPITOR) 10 MG tablet Take 1 tablet (10 mg total) by mouth daily. 08/27/14   Dorothy Spark, MD  azithromycin (ZITHROMAX Z-PAK) 250 MG tablet 1 Tablet daily for 5 days 09/08/14   Dorothy Spark, MD  carvedilol (COREG) 6.25 MG tablet Take 1 tablet (6.25 mg total) by mouth 2 (two) times daily with a meal. 08/27/14   Dorothy Spark, MD  guaiFENesin-codeine 100-10 MG/5ML syrup Take 5 mLs by mouth 3 (three) times daily as needed for cough. 09/08/14   Dorothy Spark, MD  ibuprofen (ADVIL,MOTRIN) 200 MG tablet Take  800 mg by mouth every 4 (four) hours as needed (pain).    Historical Provider, MD  losartan (COZAAR) 50 MG tablet Take 1 tablet (50 mg total) by mouth daily. 01/30/15   Tanna Furry, MD  metFORMIN (GLUCOPHAGE) 1000 MG tablet Take 1 tablet (1,000 mg total) by mouth 2 (two) times daily with a meal. 01/30/15   Tanna Furry, MD  pregabalin (LYRICA) 50 MG capsule Take 1 capsule (50 mg total) by mouth 3 (three) times daily. 01/30/14   Dorothy Spark, MD   BP 181/126 mmHg  Pulse 102  Temp(Src) 98.2 F (36.8 C) (Oral)  Resp 18   SpO2 98% Physical Exam  Constitutional: She is oriented to person, place, and time. She appears well-developed and well-nourished. No distress.  HENT:  Head: Normocephalic.  Eyes: Conjunctivae are normal. Pupils are equal, round, and reactive to light. No scleral icterus.  Neck: Normal range of motion. Neck supple. No thyromegaly present.  Cardiovascular: Normal rate and regular rhythm.  Exam reveals no gallop and no friction rub.   No murmur heard. Pulmonary/Chest: Effort normal. No respiratory distress. She has wheezes. She has no rales.  Abdominal: Soft. Bowel sounds are normal. She exhibits no distension. There is no tenderness. There is no rebound.  Musculoskeletal: Normal range of motion.  Neurological: She is alert and oriented to person, place, and time.  Skin: Skin is warm and dry. No rash noted.  Psychiatric: She has a normal mood and affect. Her behavior is normal.    ED Course  Procedures (including critical care time) Labs Review Labs Reviewed  COMPREHENSIVE METABOLIC PANEL - Abnormal; Notable for the following:    Potassium 3.4 (*)    Glucose, Bld 283 (*)    AST 60 (*)    ALT 41 (*)    Alkaline Phosphatase 157 (*)    All other components within normal limits  CBG MONITORING, ED - Abnormal; Notable for the following:    Glucose-Capillary 288 (*)    All other components within normal limits  CBC  URINALYSIS, ROUTINE W REFLEX MICROSCOPIC    Imaging Review Dg Chest 2 View  01/29/2015   CLINICAL DATA:  Hyperglycemia and hypertension.  Cough for 3 months.  EXAM: CHEST  2 VIEW  COMPARISON:  01/20/2014.  FINDINGS: The heart size and mediastinal contours are stable. The lungs are clear. There is no pleural effusion or pneumothorax. No acute osseous findings demonstrated.  IMPRESSION: Stable chest.  No active cardiopulmonary process.   Electronically Signed   By: Richardean Sale M.D.   On: 01/29/2015 23:04     EKG Interpretation None      MDM   Final diagnoses:   Essential hypertension  Hyperglycemia  Chronic nonintractable headache, unspecified headache type     Patient with normal x-ray. Some wheezing on exam. Normal blood cell count. Intact renal function. Not acidotic. Does not appear toxic. She is not hypoxemic or febrile.  Plan is hydration. Blood pressure control. Reinitiating her oral hypoglycemics and antihypertensives. Urged compliance. Primary care follow-up. Symptomatic treatment for headache. This is a slow onset headache. Not sudden in onset. I do not suspect that this is related to subarachnoid hemorrhage. She has supple neck and normal neuro exam. Doubt meningitis. Likely acute exacerbation chronic headache.   Tanna Furry, MD 01/30/15 [phone]

## 2015-01-30 NOTE — Discharge Instructions (Signed)
Tos - Adultos  (Cough, Adult)  La tos es un reflejo que ayuda a limpiar las vas areas y Patent examiner. Puede ayudar a curar el organismo o ser Ardelia Mems reaccin a un irritante. La tos puede durar [employer] 2  3 semanas (aguda) o puede durar ms de 8 semanas (crnica)  CAUSAS  Tos aguda:   Infecciones virales o bacterianas. Tos crnica.   Infecciones.  Alergias.  Asma.  Goteo post nasal.  El hbito de fumar.  Acidez o reflujo gstrico.  Algunos medicamentos.  Problemas pulmonares crnicos  Cncer. SNTOMAS   Tos.  Cristy Hilts.  Dolor en el pecho.  Aumento en el ritmo respiratorio.  Ruidos agudos al respirar (sibilancias).  Moco coloreado al toser (esputo). TRATAMIENTO   Un tos de causa bacteriana puede tratarse con antibiticos.  La tos de origen viral debe seguir su curso y no responde a los antibiticos.  El mdico podr recomendar otros tratamientos si tiene tos crnica. INSTRUCCIONES PARA EL CUIDADO EN EL HOGAR   Solo tome medicamentos que se pueden comprar sin receta o recetados para Conservation officer, historic buildings, Tree surgeon o fiebre, como le indica el mdico. Utilice antitusivos slo en la forma indicada por el mdico.  Use un vaporizador o humidificador de niebla fra en la habitacin para ayudar a aflojar las secreciones.  Duerma en posicin semi erguida si la tos empeora por la noche.  Descanse todo lo que pueda.  Si fuma, abandone el hbito. SOLICITE ATENCIN MDICA DE INMEDIATO SI:   Observa pus en el esputo.  La tos empeora.  No puede controlar la tos con antitusivos y no puede dormir debido a Presenter, broadcasting.  Comienza a escupir sangre al toser.  Tiene dificultad para respirar.  El dolor empeora o no puede controlarlo con los medicamentos.  Tiene fiebre. ASEGRESE DE QUE:   Comprende estas instrucciones.  Controlar su enfermedad.  Solicitar ayuda de inmediato si no mejora o si empeora. Document Released: 06/15/2011 Document Revised: 01/30/2012 [facility] Patient  Information 2015 [place]. This information is not intended to replace advice given to you by your health care provider. Make sure you discuss any questions you have with your health care provider.  Hipertensin (Hypertension) La hipertensin, conocida comnmente como presin arterial alta, se produce cuando la sangre bombea en las arterias con mucha fuerza. Las arterias son los vasos sanguneos que transportan la sangre desde el corazn hacia todas las partes del cuerpo. Una lectura de la presin arterial consiste en un nmero ms alto sobre un nmero ms bajo, por ejemplo, 110/72. El nmero ms alto (presin sistlica) corresponde a la presin interna de las arterias cuando el corazn [place]. El nmero ms bajo (presin diastlica) corresponde a la presin interna de las arterias cuando el corazn se relaja. En condiciones ideales, la presin arterial debe ser inferior a 120/80. La hipertensin fuerza al corazn a trabajar ms para Herbalist. Las arterias pueden estrecharse o ponerse rgidas. La hipertensin conlleva el riesgo de enfermedad cardaca, ictus y otros problemas.  [place] de riesgo de hipertensin son controlables, pero otros no lo son.  [employer] factores de riesgo que usted no puede Chief Technology Officer, se incluyen:   Manufacturing systems engineer. El riesgo es mayor para las Retail banker.  La edad. Los riesgos aumentan con la edad.  El sexo. Antes de los 45aos, los hombres corren ms [employer]. Despus de los 65aos, las mujeres corren ms [employer]. [employer] factores de riesgo que usted puede [place],  se incluyen:  No hacer la cantidad suficiente de actividad fsica o ejercicio.  Tener sobrepeso.  Consumir mucha grasa, azcar, caloras o sal en la dieta.  Beber alcohol en exceso. SIGNOS Y SNTOMAS Por lo general, la hipertensin no causa signos o sntomas. La hipertensin demasiado alta (crisis hipertensiva) puede  causar dolor de cabeza, ansiedad, falta de aire y hemorragia nasal. DIAGNSTICO  Para detectar si usted tiene hipertensin, el mdico le medir la presin arterial mientras est sentado, con el brazo levantado a la altura del corazn. Debe medirla al [facility] veces en el mismo brazo. Determinadas condiciones pueden causar una diferencia de presin arterial entre el brazo izquierdo y Insurance underwriter. El hecho de tener una sola lectura de la presin arterial ms alta que lo normal no significa que Stage manager. En el caso de tener una lectura de la presin arterial con un valor alto, pdale al mdico que la verifique nuevamente. Miranda Frese hipertensin arterial incluye hacer cambios en el estilo de vida y, posiblemente, tomar medicamentos. Un estilo de vida saludable puede ayudar a bajar la presin arterial alta. Quiz deba cambiar algunos hbitos. Los [employer] en el estilo de vida pueden incluir:  Seguir la dieta DASH. Esta dieta tiene un alto contenido de frutas, verduras y Psychologist, prison and probation services. Incluye poca cantidad de sal, carnes rojas y azcares agregados.  Hacer al menos 2horas de actividad fsica enrgica todas las semanas.  Perder peso, si es necesario.  No fumar.  Limitar el consumo de bebidas alcohlicas.  Aprender formas de reducir el estrs. Si los cambios en el estilo de vida no son suficientes para Child psychotherapist la presin arterial, el mdico puede recetarle medicamentos. Quiz necesite tomar ms de uno. Trabaje en conjunto con su mdico para comprender los riesgos y los beneficios. INSTRUCCIONES PARA EL CUIDADO EN EL HOGAR  Haga que le midan de nuevo la presin arterial segn las indicaciones del [place] los medicamentos solamente como se lo haya indicado el mdico. Siga cuidadosamente las indicaciones. Los medicamentos para la presin arterial deben tomarse segn las indicaciones. Los medicamentos pierden eficacia al omitir las dosis. El  hecho de omitir las dosis tambin [place] el riesgo de otros problemas.  No fume.  Contrlese la presin arterial en su casa segn las indicaciones del mdico. SOLICITE ATENCIN MDICA SI:   Piensa que tiene una reaccin alrgica a los medicamentos.  Tiene mareos o dolores de cabeza con Scientist, research (physical sciences).  Tiene hinchazn en los tobillos.  Tiene problemas de visin. SOLICITE ATENCIN MDICA DE INMEDIATO SI:  Siente un dolor de cabeza intenso o confusin.  Siente debilidad inusual, adormecimiento o que Geneticist, molecular.  Siente dolor intenso en el pecho o en el abdomen.  Vomita repetidas veces.  Tiene dificultad para respirar. ASEGRESE DE QUE:   Comprende estas instrucciones.  Controlar su afeccin.  Recibir ayuda de inmediato si no mejora o si empeora. Document Released: 11/07/2005 Document Revised: 03/24/2014 [facility] Patient Information 2015 [place], [place]. This information is not intended to replace advice given to you by your health care provider. Make sure you discuss any questions you have with your health care provider.  Nivel elevado de azcar en sangre (High Blood Sugar) El nivel elevado de azcar en la sangre (hiperglucemia) significa que es mayor de lo que debera ser. Entre los signos de hiperglucemia se incluyen:  Sensacin de sed.  Orinar con frecuencia.  Sentirse cansado o con sueo.  [employer].  Cambios en la visin.  Sensacin  de debilidad.  Sensacin de [place], Armed forces training and education officer con prdida de [place].  Adormecimiento u hormigueo en las manos o en los pies.  Dolor de [place]. Si se ignoran estas seales, el azcar en la sangre puede seguir subiendo. Estos problemas Scientist, research (medical), y otros problemas pueden Medical laboratory scientific officer. CUIDADOS EN EL HOGAR  Controle los niveles de Museum/gallery exhibitions officer segn le haya indicado el mdico. Anote los valores con la fecha y la hora.  Tome la cantidad correcta de insulina o pldoras para la diabetes en el momento adecuado. Anote las dosis con  la fecha y Geologist, engineering.  Reponga la insulina o las pldoras para la diabetes antes de que se le acaben.  Controle lo que come. Siga el plan de alimentacin.  Beba lquidos sin azcar, como agua. Verifique con su mdico si tiene trastornos del rin o del corazn.  Siga las instrucciones del mdico para hacer ejercicio. Haga actividad fsica en el mismo momento del da.  Cumpla con las citas mdicas. SOLICITE AYUDA DE INMEDIATO SI:   Presenta dificultades para pensar o se siente confundido.  Tiene respiracin rpida y su aliento tiene [place] a frutas.  Se desmaya.  Lleva 2 a 3 das con [employer] de Dispensing optician y no sabe la razn.  Siente dolor en el pecho.  Tiene Higher education careers adviser (nuseas) y vmitos.  Tiene cambios repentinos en la vista. ASEGRESE DE QUE:   Comprende estas instrucciones.  Controlar su enfermedad.  Solicitar ayuda de inmediato si no mejora o empeora. Document Released: 12/10/2010 Document Revised: 01/30/2012 [facility] Patient Information 2015 [place]. This information is not intended to replace advice given to you by your health care provider. Make sure you discuss any questions you have with your health care provider.

## 2015-03-17 ENCOUNTER — Other Ambulatory Visit: Payer: Self-pay | Admitting: Internal Medicine

## 2015-03-17 ENCOUNTER — Telehealth: Payer: Self-pay | Admitting: Internal Medicine

## 2015-03-17 NOTE — Telephone Encounter (Signed)
Refilled one month of metformin.  Patient will need appointment for additional refills she has not been seen by PCP since 08/2014

## 2015-03-17 NOTE — Telephone Encounter (Signed)
Patient is requesting a refill:  metFORMIN (GLUCOPHAGE) 1000 MG tablet

## 2015-03-19 MED ORDER — METFORMIN HCL 1000 MG PO TABS: 1000.0000 mg | ORAL_TABLET | Freq: Two times a day (BID) | ORAL | Status: DC

## 2015-03-30 ENCOUNTER — Encounter: Payer: Self-pay | Admitting: Internal Medicine

## 2015-03-30 ENCOUNTER — Ambulatory Visit: Payer: Self-pay | Attending: Internal Medicine | Admitting: Internal Medicine

## 2015-03-30 VITALS — BP 144/82 | HR 74 | Temp 98.0°F | Resp 16 | Wt 142.6 lb

## 2015-03-30 DIAGNOSIS — E139 Other specified diabetes mellitus without complications: Secondary | ICD-10-CM | POA: Insufficient documentation

## 2015-03-30 DIAGNOSIS — G629 Polyneuropathy, unspecified: Secondary | ICD-10-CM | POA: Insufficient documentation

## 2015-03-30 DIAGNOSIS — I10 Essential (primary) hypertension: Secondary | ICD-10-CM | POA: Insufficient documentation

## 2015-03-30 DIAGNOSIS — I25119 Atherosclerotic heart disease of native coronary artery with unspecified angina pectoris: Secondary | ICD-10-CM | POA: Insufficient documentation

## 2015-03-30 LAB — POCT GLYCOSYLATED HEMOGLOBIN (HGB A1C): HEMOGLOBIN A1C: 7.3

## 2015-03-30 MED ORDER — LOSARTAN POTASSIUM 50 MG PO TABS: 50.0000 mg | ORAL_TABLET | Freq: Every day | ORAL | Status: DC

## 2015-03-30 MED ORDER — ATORVASTATIN CALCIUM 10 MG PO TABS: 10.0000 mg | ORAL_TABLET | Freq: Every day | ORAL | Status: DC

## 2015-03-30 MED ORDER — PREGABALIN 50 MG PO CAPS: 50.0000 mg | ORAL_CAPSULE | Freq: Three times a day (TID) | ORAL | Status: DC

## 2015-03-30 MED ORDER — METFORMIN HCL 1000 MG PO TABS: ORAL_TABLET | ORAL | Status: DC

## 2015-03-30 MED ORDER — CARVEDILOL 6.25 MG PO TABS: 6.2500 mg | ORAL_TABLET | Freq: Two times a day (BID) | ORAL | Status: DC

## 2015-03-30 NOTE — Progress Notes (Signed)
MRN: [id number] Name: Brandi Bryant  Sex: female Age: 59 y.o. DOB: 1956/02/22  Allergies: Gadolinium derivatives  Chief Complaint  Patient presents with  . Follow-up    HPI: Patient is 59 y.o. female who history of CAD, diabetes, hypertension comes today for followup, on the last visit her metformin dose was increased, there is improvement in her A1c but recently patient then out of her blood pressure and diabetes medication, she is requesting refill on her meds, she still has occasional palpitation, patient has been advised to make a followup appointment with her cardiologist, denies any chest pain or shortness of breath.  Past Medical History  Diagnosis Date  . Breast cancer   . HTN (hypertension)   . Diabetes mellitus   . Cholelithiasis   . Hepatic steatosis   . Abdominal pain   . History of breast cancer 06/01/2012  . Arthralgia 08/13/2013  . Chest pain 09/30/2013  . Stroke 2006  . Bell's palsy     Past Surgical History  Procedure Laterality Date  . Tubal ligation    . Breast lumpectomy  2010  . Left heart catheterization with coronary angiogram N/A 10/30/2013    Procedure: LEFT HEART CATHETERIZATION WITH CORONARY ANGIOGRAM;  Surgeon: Josue Hector, MD;  Location: [facility] CATH LAB;  Service: Cardiovascular;  Laterality: N/A;      Medication List       This list is accurate as of: 03/30/15  1:37 PM.  Always use your most recent med list.               albuterol 108 (90 BASE) MCG/ACT inhaler  Commonly known as:  PROVENTIL HFA;VENTOLIN HFA  Inhale 1-2 puffs into the lungs every 6 (six) hours as needed for wheezing.     aspirin 81 MG chewable tablet  Chew by mouth daily.     atorvastatin 10 MG tablet  Commonly known as:  LIPITOR  Take 1 tablet (10 mg total) by mouth daily.     azithromycin 250 MG tablet  Commonly known as:  ZITHROMAX Z-PAK  1 Tablet daily for 5 days     carvedilol 6.25 MG tablet  Commonly known as:  COREG  Take 1 tablet (6.25 mg  total) by mouth 2 (two) times daily with a meal.     guaiFENesin-codeine 100-10 MG/5ML syrup  Take 5 mLs by mouth 3 (three) times daily as needed for cough.     HYDROcodone-acetaminophen 5-325 MG per tablet  Commonly known as:  NORCO/VICODIN  Take 2 tablets by mouth every 4 (four) hours as needed.     ibuprofen 200 MG tablet  Commonly known as:  ADVIL,MOTRIN  Take 800 mg by mouth every 4 (four) hours as needed (pain).     losartan 50 MG tablet  Commonly known as:  COZAAR  Take 1 tablet (50 mg total) by mouth daily.     metFORMIN 1000 MG tablet  Commonly known as:  GLUCOPHAGE  TAKE 1 TABLET BY MOUTH 2 TIMES DAILY WITH A MEAL.     pregabalin 50 MG capsule  Commonly known as:  LYRICA  Take 1 capsule (50 mg total) by mouth 3 (three) times daily.        Meds ordered this encounter  Medications  . atorvastatin (LIPITOR) 10 MG tablet    Sig: Take 1 tablet (10 mg total) by mouth daily.    Dispense:  90 tablet    Refill:  10  . carvedilol (COREG) 6.25 MG tablet  Sig: Take 1 tablet (6.25 mg total) by mouth 2 (two) times daily with a meal.    Dispense:  180 tablet    Refill:  10  . losartan (COZAAR) 50 MG tablet    Sig: Take 1 tablet (50 mg total) by mouth daily.    Dispense:  30 tablet    Refill:  3  . metFORMIN (GLUCOPHAGE) 1000 MG tablet    Sig: TAKE 1 TABLET BY MOUTH 2 TIMES DAILY WITH A MEAL.    Dispense:  60 tablet    Refill:  4  . pregabalin (LYRICA) 50 MG capsule    Sig: Take 1 capsule (50 mg total) by mouth 3 (three) times daily.    Dispense:  60 capsule    Refill:  6    Immunization History  Administered Date(s) Administered  . Influenza,inj,Quad PF,36+ Mos 08/21/2014    Family History  Problem Relation Age of Onset  . Thyroid cancer Sister   . Hypertension Mother   . Diabetes Mother   . Migraines Daughter     History  Substance Use Topics  . Smoking status: Never Smoker   . Smokeless tobacco: Never Used  . Alcohol Use: No    Review of  Systems   As noted in HPI  Filed Vitals:   03/30/15 1056  BP: 144/82  Pulse: 74  Temp: 98 F (36.7 C)  Resp: 16    Physical Exam  Physical Exam  Constitutional: No distress.  Eyes: EOM are normal. Pupils are equal, round, and reactive to light.  Cardiovascular: Normal rate and regular rhythm.   Pulmonary/Chest: Breath sounds normal. No respiratory distress. She has no wheezes. She has no rales.  Musculoskeletal: She exhibits no edema.    CBC    Component Value Date/Time   WBC 6.4 01/29/2015 2306   WBC 5.8 09/30/2013 1033   RBC 4.54 01/29/2015 2306   RBC 4.42 09/30/2013 1033   HGB 13.7 01/29/2015 2306   HGB 13.4 09/30/2013 1033   HCT 39.2 01/29/2015 2306   HCT 39.1 09/30/2013 1033   PLT 183 01/29/2015 2306   PLT 150 09/30/2013 1033   MCV 86.3 01/29/2015 2306   MCV 88.5 09/30/2013 1033   LYMPHSABS 1.9 09/08/2014 1018   LYMPHSABS 2.1 09/30/2013 1033   MONOABS 0.5 09/08/2014 1018   MONOABS 0.4 09/30/2013 1033   EOSABS 0.3 09/08/2014 1018   EOSABS 0.2 09/30/2013 1033   BASOSABS 0.0 09/08/2014 1018   BASOSABS 0.0 09/30/2013 1033    CMP     Component Value Date/Time   NA 137 01/29/2015 2306   NA 139 09/30/2013 1033   K 3.4* 01/29/2015 2306   K 4.2 09/30/2013 1033   CL 102 01/29/2015 2306   CL 107 01/28/2013 0854   CO2 27 01/29/2015 2306   CO2 22 09/30/2013 1033   GLUCOSE 283* 01/29/2015 2306   GLUCOSE 202* 04/18/2014 0853   GLUCOSE 243* 01/28/2013 0854   BUN 12 01/29/2015 2306   BUN 13.0 09/30/2013 1033   CREATININE 0.60 01/29/2015 2306   CREATININE 0.94 01/24/2014 1510   CREATININE 0.8 09/30/2013 1033   CALCIUM 9.2 01/29/2015 2306   CALCIUM 9.9 09/30/2013 1033   PROT 7.3 01/29/2015 2306   PROT 7.5 09/30/2013 1033   ALBUMIN 3.7 01/29/2015 2306   ALBUMIN 3.7 09/30/2013 1033   AST 60* 01/29/2015 2306   AST 50* 09/30/2013 1033   ALT 41* 01/29/2015 2306   ALT 45 09/30/2013 1033   ALKPHOS 157* 01/29/2015 2306  ALKPHOS 108 09/30/2013 1033   BILITOT  0.7 01/29/2015 2306   BILITOT 0.89 09/30/2013 1033   GFRNONAA >90 01/29/2015 2306   GFRNONAA 68 01/24/2014 1510   GFRAA >90 01/29/2015 2306   GFRAA 78 01/24/2014 1510    Lab Results  Component Value Date/Time   CHOL 155 04/18/2014 08:55 AM    Lab Results  Component Value Date/Time   HGBA1C 7.30 03/30/2015 11:02 AM   HGBA1C 6.8* 04/18/2014 08:55 AM    Lab Results  Component Value Date/Time   AST 60* 01/29/2015 11:06 PM   AST 50* 09/30/2013 10:33 AM    Assessment and Plan  Other specified diabetes mellitus without complications - Plan:  Results for orders placed or performed in visit on 03/30/15  HgB A1c  Result Value Ref Range   Hemoglobin A1C 7.30    Her last A1c in October 2000 and was 8.7%, and has trended down to 7.3 %, advise patient for diabetes meal planning continue with metFORMIN (GLUCOPHAGE) 1000 MG tablet, repeat A1c in 3 months.  Coronary artery disease involving native coronary artery of native heart with angina pectoris - Plan:continue with her current meds  atorvastatin (LIPITOR) 10 MG tablet, carvedilol (COREG) 6.25 MG tablet, patient to followup with her cardiologist.  Essential hypertension - Plan: blood pressure today is borderline elevated as per patient she has not taken her medications,continue with carvedilol (COREG) 6.25 MG tablet, losartan (COZAAR) 50 MG tablet  Neuropathy - Plan: pregabalin (LYRICA) 50 MG capsule  Will check fasting blood work on the next visit  Return in about 3 months (around 06/30/2015) for diabetes, hypertension.   This note has been created with Surveyor, quantity. Any transcriptional errors are unintentional.    [name], MD

## 2015-03-30 NOTE — Patient Instructions (Addendum)
Plan de alimentacin DASH (DASH Eating Plan) DASH es la sigla en ingls de "Enfoques Alimentarios para Detener la Hipertensin". El plan de alimentacin DASH ha demostrado bajar la presin arterial elevada (hipertensin). Los beneficios adicionales para la salud pueden incluir la disminucin del riesgo de diabetes mellitus tipo2, enfermedades cardacas e ictus. Este plan tambin puede ayudar a Horticulturist, commercial. QU DEBO SABER ACERCA DEL PLAN DE ALIMENTACIN DASH? Para el plan de alimentacin DASH, seguir las siguientes pautas generales:  Elija los alimentos con un valor porcentual diario de sodio de menos del 5% (segn figura en la etiqueta del alimento).  Use hierbas o aderezos sin sal, en lugar de sal de mesa o sal marina.  Consulte al mdico o farmacutico antes de usar sustitutos de la sal.  Coma productos con bajo contenido de sodio, cuya etiqueta suele decir "bajo contenido de sodio" o "sin agregado de sal".  Coma alimentos frescos.  Coma ms verduras, frutas y productos lcteos con bajo contenido de [place].  Elija los cereales integrales. Busque la palabra "integral" en Equities trader de la lista de ingredientes.  Elija el pescado y el pollo o el pavo sin piel ms a menudo que las carnes rojas. Limite el consumo de pescado, carne de ave y carne a 6onzas (170g) por Training and development officer.  Limite el consumo de dulces, postres, azcares y bebidas azucaradas.  Elija las grasas saludables para el corazn.  Limite el consumo de queso a 1onza (28g) por Training and development officer.  Consuma ms comida casera y menos de restaurante, de buf y comida rpida.  Limite el consumo de alimentos fritos.  Cocine los alimentos utilizando mtodos que no sean la fritura.  Limite las verduras enlatadas. Si las consume, enjuguelas bien para disminuir el sodio.  Cuando coma en un restaurante, pida que preparen su comida con menos sal o, en lo posible, sin nada de sal. QU ALIMENTOS PUEDO COMER? Pida ayuda a un nutricionista para  conocer las necesidades calricas individuales. Cereales Pan de salvado o integral. Arroz integral. Pastas de salvado o integrales. Quinua, trigo burgol y cereales integrales. Cereales con bajo contenido de sodio. Tortillas de harina de maz o de salvado. Pan de maz integral. Galletas saladas integrales. Galletas con bajo contenido de [place]. Vegetales Verduras frescas o congeladas (crudas, al vapor, asadas o grilladas). Jugos de tomate y verduras con contenido bajo o reducido de sodio. Pasta y salsa de tomate con contenido bajo o [place]. Verduras enlatadas con bajo contenido de sodio o reducido de sodio.  Lambert Mody Lambert Mody frescas, en conserva (en su jugo natural) o frutas congeladas. Carnes y otros productos con protenas Carne de res molida (al 85% o ms [place]), carne de res de animales alimentados con pastos o carne de res sin la grasa. Pollo o pavo sin piel. Carne de pollo o de [place]. Cerdo sin la grasa. Todos los pescados y frutos de mar. Huevos. Porotos, guisantes o lentejas secos. Frutos secos y semillas sin sal. Frijoles enlatados sin sal. Lcteos Productos lcteos con bajo contenido de grasas, como [place] o al 1%, quesos reducidos en grasas o al 2%, ricota con bajo contenido de grasas o [employer], o yogur natural con bajo contenido de [place]. Quesos con contenido bajo o reducido de sodio. Grasas y Naval architect en barra que no contengan grasas trans. [place] y alios para ensaladas livianos o reducidos en grasas (reducidos en sodio). [place]. Aceites de crtamo, oliva o canola. [place] natural de man o almendra. Otros Palomitas de maz y pretzels sin sal.  Los artculos mencionados arriba pueden no ser [employer] de las bebidas o los alimentos recomendados. Comunquese con el nutricionista para conocer ms opciones. QU ALIMENTOS NO SE RECOMIENDAN? Cereales Pan blanco. Pastas blancas. Arroz blanco. Pan de maz refinado. Bagels y  croissants. Galletas saladas que contengan grasas trans. Vegetales Vegetales con crema o fritos. Verduras en [place]. Verduras enlatadas comunes. Pasta y salsa de tomate en lata comunes. Jugos comunes de tomate y de verduras. Lambert Mody Frutas secas. Fruta enlatada en almbar liviano o espeso. Jugo de frutas. Carnes y otros productos con protenas [place] de carne con Lobbyist. Costillas, alas de pollo, tocineta, salchicha, mortadela, salame, chinchulines, tocino, perros calientes, salchichas alemanas y embutidos envasados. Frutos secos y semillas con sal. Frijoles con sal en lata. Lcteos Leche entera o al 2%, crema, mezcla de [place] y crema, y queso crema. Yogur entero o endulzado. Quesos o queso azul con alto contenido de Physicist, medical. [place]rem[place] no lcteas y coberturas batidas. Quesos procesados, quesos para untar o cuajadas. Condimentos Sal de cebolla y ajo, sal condimentada, sal de mesa y sal marina. Salsas en lata y envasadas. Salsa [place]. Salsa trtara. Salsa barbacoa. Salsa teriyaki. Salsa de soja, incluso la que tiene contenido reducido de [place]. Salsa de carne. Salsa de pescado. Salsa de [place]. Salsa rosada. Rbano picante. Ketchup y mostaza. Saborizantes y tiernizantes para carne. Caldo en cubitos. Salsa picante. Salsa tabasco. Adobos. Aderezos para tacos. Salsas. Grasas y aceites [place], [place] en barra, Sylvarena de [place], [place], [place] clarificada y Wendee Copp de tocino. Aceites de coco, de palmiste o de palma. Aderezos comunes para ensalada. Otros Pickles y [place]. Palomitas de maz y pretzels con sal. Los artculos mencionados arriba pueden no ser [employer] de las bebidas y los alimentos que se Higher education careers adviser. Comunquese con el nutricionista para obtener ms informacin. DNDE Dolan Amen MS INFORMACIN? [place], del Pulmn y de la Sangre (National Heart, Lung, and [place]):  [email or web address] Document Released: 10/27/2011 Document Revised: 03/24/2014 [facility] Patient Information 2015 [place], [place]. This information is not intended to replace advice given to you by your health care provider. Make sure you discuss any questions you have with your health care provider. La diabetes mellitus y los alimentos (Diabetes Mellitus and Food) Es importante que controle su nivel de azcar en la sangre (glucosa). El nivel de glucosa en sangre depende en gran medida de lo que usted come. Comer alimentos saludables en las cantidades [place] a lo largo del Training and development officer, aproximadamente a la misma hora [employer], lo ayudar a Chief Technology Officer su nivel de Multimedia programmer. Tambin puede ayudarlo a retrasar o Patent attorney de la diabetes mellitus. Comer de [employer] saludable incluso puede ayudarlo a Chartered loss adjuster de presin arterial y a Science writer o Theatre manager un peso saludable.  CMO PUEDEN AFECTARME LOS ALIMENTOS? Carbohidratos Los carbohidratos afectan el nivel de glucosa en sangre ms que cualquier otro tipo de alimento. El nutricionista lo ayudar a Teacher, adult education cuntos carbohidratos puede consumir en cada comida y ensearle a contarlos. El recuento de carbohidratos es importante para mantener la glucosa en sangre en un nivel saludable, en especial si utiliza insulina o toma determinados medicamentos para la diabetes mellitus. Alcohol El alcohol puede provocar disminuciones sbitas de la glucosa en sangre (hipoglucemia), en especial si utiliza insulina o toma determinados medicamentos para la diabetes mellitus. La hipoglucemia es una afeccin que puede poner en peligro la vida. Los sntomas de la hipoglucemia (somnolencia, mareos y Data processing manager) son similares a los sntomas de  haber consumido mucho alcohol.  Si el mdico lo autoriza a beber alcohol, hgalo con moderacin y siga estas pautas:  Las mujeres no deben beber ms de un trago por da, y los  hombres no deben beber ms de dos tragos por da. Un trago es igual a:  12 onzas (355 ml) de cerveza  5 onzas de vino (150 ml) de vino  1,5onzas (45ml) de bebidas espirituosas  No beba con el estmago vaco.  Mantngase hidratado. Beba agua, gaseosas dietticas o t helado sin azcar.  Las gaseosas comunes, los jugos y otros refrescos podran contener muchos carbohidratos y se deben contar. QU ALIMENTOS NO SE RECOMIENDAN? Cuando haga las elecciones de alimentos, es importante que recuerde que todos los alimentos son distintos. Algunos tienen menos nutrientes que otros por porcin, aunque podran tener la misma cantidad de caloras o carbohidratos. Es difcil darle al cuerpo lo que necesita cuando consume alimentos con menos nutrientes. Estos son algunos ejemplos de alimentos que debera evitar ya que contienen muchas caloras y carbohidratos, pero pocos nutrientes:  Grasas trans (la mayora de los alimentos procesados incluyen grasas trans en la etiqueta de Informacin nutricional).  Gaseosas comunes.  Jugos.  Caramelos.  Dulces, como tortas, pasteles, rosquillas y galletas.  Comidas fritas. QU ALIMENTOS PUEDO COMER? Consuma alimentos ricos en nutrientes, que nutrirn el cuerpo y lo mantendrn saludable. Los alimentos que debe comer tambin dependern de varios factores, como:  Las caloras que necesita.  Los medicamentos que toma.  Su peso.  El nivel de glucosa en sangre.  El nivel de presin arterial.  El nivel de colesterol. Tambin debe consumir una variedad de alimentos, como:  Protenas, como carne, aves, pescado, tofu, frutos secos y semillas (las protenas de animales magros son mejores).  Frutas.  Verduras.  Productos lcteos, como leche, queso y yogur (descremados son mejores).  Panes, granos, pastas, cereales, arroz y frijoles.  Grasas, como aceite de oliva, margarina sin grasas trans, aceite de canola, aguacate y aceitunas. TODOS LOS QUE  PADECEN DIABETES MELLITUS TIENEN EL MISMO PLAN DE COMIDAS? Dado que todas las personas que padecen diabetes mellitus son distintas, no hay un solo plan de comidas que funcione para todos. Es muy importante que se rena con un nutricionista que lo ayudar a crear un plan de comidas adecuado para usted. Document Released: 02/14/2008 Document Revised: 11/12/2013 ExitCare Patient Information 2015 [employer]. This information is not intended to replace advice given to you by your health care provider. Make sure you discuss any questions you have with your health care provider.  

## 2015-03-30 NOTE — Progress Notes (Signed)
Interpreter line used Patient states she is here for follow up on her diabetes and for medication refills

## 2015-04-06 ENCOUNTER — Telehealth: Payer: Self-pay | Admitting: Internal Medicine

## 2015-04-06 NOTE — Telephone Encounter (Signed)
Patient came into facility to ask for an Rx change from pregabalin (LYRICA) 50 MG capsule to something different, patient states that the medication is too expensive. Patient uses our pharmacy. Please f/u

## 2015-04-15 ENCOUNTER — Ambulatory Visit: Payer: Self-pay

## 2015-04-15 ENCOUNTER — Telehealth: Payer: Self-pay | Admitting: Internal Medicine

## 2015-04-15 NOTE — Telephone Encounter (Signed)
Patient came into office requesting medication change for pregabalin (LYRICA) 50 MG capsule. Patient states medication is to expensive , pt also states she was mentioned she would be referred out to cardiology. Please f/u with patient

## 2015-04-21 ENCOUNTER — Ambulatory Visit: Payer: Self-pay | Attending: Internal Medicine | Admitting: Internal Medicine

## 2015-04-21 ENCOUNTER — Encounter: Payer: Self-pay | Admitting: Internal Medicine

## 2015-04-21 VITALS — BP 145/78 | HR 86 | Temp 98.0°F | Resp 16 | Wt 143.0 lb

## 2015-04-21 DIAGNOSIS — I2510 Atherosclerotic heart disease of native coronary artery without angina pectoris: Secondary | ICD-10-CM | POA: Insufficient documentation

## 2015-04-21 DIAGNOSIS — Z853 Personal history of malignant neoplasm of breast: Secondary | ICD-10-CM | POA: Insufficient documentation

## 2015-04-21 DIAGNOSIS — Z8673 Personal history of transient ischemic attack (TIA), and cerebral infarction without residual deficits: Secondary | ICD-10-CM | POA: Insufficient documentation

## 2015-04-21 DIAGNOSIS — M791 Myalgia, unspecified site: Secondary | ICD-10-CM

## 2015-04-21 DIAGNOSIS — E139 Other specified diabetes mellitus without complications: Secondary | ICD-10-CM

## 2015-04-21 DIAGNOSIS — G629 Polyneuropathy, unspecified: Secondary | ICD-10-CM

## 2015-04-21 DIAGNOSIS — E1140 Type 2 diabetes mellitus with diabetic neuropathy, unspecified: Secondary | ICD-10-CM | POA: Insufficient documentation

## 2015-04-21 DIAGNOSIS — I10 Essential (primary) hypertension: Secondary | ICD-10-CM

## 2015-04-21 DIAGNOSIS — E785 Hyperlipidemia, unspecified: Secondary | ICD-10-CM | POA: Insufficient documentation

## 2015-04-21 LAB — COMPLETE METABOLIC PANEL WITH GFR
ALBUMIN: 3.9 g/dL (ref 3.5–5.2)
ALT: 37 U/L — ABNORMAL HIGH (ref 0–35)
AST: 41 U/L — AB (ref 0–37)
Alkaline Phosphatase: 111 U/L (ref 39–117)
BUN: 10 mg/dL (ref 6–23)
CALCIUM: 9.4 mg/dL (ref 8.4–10.5)
CHLORIDE: 105 meq/L (ref 96–112)
CO2: 24 mEq/L (ref 19–32)
Creat: 0.62 mg/dL (ref 0.50–1.10)
GFR, Est African American: >89 mL/min
GFR, Est Non African American: >89 mL/min
GLUCOSE: 232 mg/dL — AB (ref 70–99)
POTASSIUM: 4.1 meq/L (ref 3.5–5.3)
Sodium: 140 mEq/L (ref 135–145)
TOTAL PROTEIN: 6.9 g/dL (ref 6.0–8.3)
Total Bilirubin: 0.6 mg/dL (ref 0.2–1.2)

## 2015-04-21 LAB — GLUCOSE, POCT (MANUAL RESULT ENTRY): POC Glucose: 240 mg/dl — AB (ref 70–99)

## 2015-04-21 LAB — CK: Total CK: 37 U/L (ref 7–177)

## 2015-04-21 MED ORDER — GABAPENTIN 100 MG PO CAPS: 100.0000 mg | ORAL_CAPSULE | Freq: Three times a day (TID) | ORAL | Status: DC

## 2015-04-21 NOTE — Patient Instructions (Signed)
DASH Eating Plan °DASH stands for "Dietary Approaches to Stop Hypertension." The DASH eating plan is a healthy eating plan that has been shown to reduce high blood pressure (hypertension). Additional health benefits may include reducing the risk of type 2 diabetes mellitus, heart disease, and stroke. The DASH eating plan may also help with weight loss. °WHAT DO I NEED TO KNOW ABOUT THE DASH EATING PLAN? °For the DASH eating plan, you will follow these general guidelines: °· Choose foods with a percent daily value for sodium of less than 5% (as listed on the food label). °· Use salt-free seasonings or herbs instead of table salt or sea salt. °· Check with your health care provider or pharmacist before using salt substitutes. °· Eat lower-sodium products, often labeled as "lower sodium" or "no salt added." °· Eat fresh foods. °· Eat more vegetables, fruits, and low-fat dairy products. °· Choose whole grains. Look for the word "whole" as the first word in the ingredient list. °· Choose fish and skinless chicken or turkey more often than red meat. Limit fish, poultry, and meat to 6 oz (170 g) each day. °· Limit sweets, desserts, sugars, and sugary drinks. °· Choose heart-healthy fats. °· Limit cheese to 1 oz (28 g) per day. °· Eat more home-cooked food and less restaurant, buffet, and fast food. °· Limit fried foods. °· Cook foods using methods other than frying. °· Limit canned vegetables. If you do use them, rinse them well to decrease the sodium. °· When eating at a restaurant, ask that your food be prepared with less salt, or no salt if possible. °WHAT FOODS CAN I EAT? °Seek help from a dietitian for individual calorie needs. °Grains °Whole grain or whole wheat bread. Brown rice. Whole grain or whole wheat pasta. Quinoa, bulgur, and whole grain cereals. Low-sodium cereals. Corn or whole wheat flour tortillas. Whole grain cornbread. Whole grain crackers. Low-sodium crackers. °Vegetables °Fresh or frozen vegetables  (raw, steamed, roasted, or grilled). Low-sodium or reduced-sodium tomato and vegetable juices. Low-sodium or reduced-sodium tomato sauce and paste. Low-sodium or reduced-sodium canned vegetables.  °Fruits °All fresh, canned (in natural juice), or frozen fruits. °Meat and Other Protein Products °Ground beef (85% or leaner), grass-fed beef, or beef trimmed of fat. Skinless chicken or turkey. Ground chicken or turkey. Pork trimmed of fat. All fish and seafood. Eggs. Dried beans, peas, or lentils. Unsalted nuts and seeds. Unsalted canned beans. °Dairy °Low-fat dairy products, such as skim or 1% milk, 2% or reduced-fat cheeses, low-fat ricotta or cottage cheese, or plain low-fat yogurt. Low-sodium or reduced-sodium cheeses. °Fats and Oils °Tub margarines without trans fats. Light or reduced-fat mayonnaise and salad dressings (reduced sodium). Avocado. Safflower, olive, or canola oils. Natural peanut or almond butter. °Other °Unsalted popcorn and pretzels. °The items listed above may not be a complete list of recommended foods or beverages. Contact your dietitian for more options. °WHAT FOODS ARE NOT RECOMMENDED? °Grains °White bread. White pasta. White rice. Refined cornbread. Bagels and croissants. Crackers that contain trans fat. °Vegetables °Creamed or fried vegetables. Vegetables in a cheese sauce. Regular canned vegetables. Regular canned tomato sauce and paste. Regular tomato and vegetable juices. °Fruits °Dried fruits. Canned fruit in light or heavy syrup. Fruit juice. °Meat and Other Protein Products °Fatty cuts of meat. Ribs, chicken wings, bacon, sausage, bologna, salami, chitterlings, fatback, hot dogs, bratwurst, and packaged luncheon meats. Salted nuts and seeds. Canned beans with salt. °Dairy °Whole or 2% milk, cream, half-and-half, and cream cheese. Whole-fat or sweetened yogurt. Full-fat   cheeses or blue cheese. Nondairy creamers and whipped toppings. Processed cheese, cheese spreads, or cheese  curds. °Condiments °Onion and garlic salt, seasoned salt, table salt, and sea salt. Canned and packaged gravies. Worcestershire sauce. Tartar sauce. Barbecue sauce. Teriyaki sauce. Soy sauce, including reduced sodium. Steak sauce. Fish sauce. Oyster sauce. Cocktail sauce. Horseradish. Ketchup and mustard. Meat flavorings and tenderizers. Bouillon cubes. Hot sauce. Tabasco sauce. Marinades. Taco seasonings. Relishes. °Fats and Oils °Butter, stick margarine, lard, shortening, ghee, and bacon fat. Coconut, palm kernel, or palm oils. Regular salad dressings. °Other °Pickles and olives. Salted popcorn and pretzels. °The items listed above may not be a complete list of foods and beverages to avoid. Contact your dietitian for more information. °WHERE CAN I FIND MORE INFORMATION? °[employer], [employer], [employer]: www.nhlbi.nih.gov/health/health-topics/topics/dash/ °Document Released: 10/27/2011 Document Revised: 03/24/2014 Document Reviewed: 09/11/2013 °[employer]® Patient Information ©2015 [employer]. This information is not intended to replace advice given to you by your health care provider. Make sure you discuss any questions you have with your health care provider. ° °

## 2015-04-21 NOTE — Progress Notes (Signed)
Interpreter line used Patient states the medications she takes for her heart and blood pressure Does not settle good with her Her blood pressure goes up and down States the medications gives her cramps and generally she does not feel good

## 2015-04-21 NOTE — Progress Notes (Signed)
MRN: [id number] Name: Brandi Bryant  Sex: female Age: 59 y.o. DOB: 07-Jul-1956  Allergies: Gadolinium derivatives  Chief Complaint  Patient presents with  . Follow-up    HPI: Patient is 59 y.o. female who history of hypertension diabetes,CAD comes today complaining of and fatigue, patient thinks Coreg and cholesterol medication is likely causing her the symptoms, she does have occasional palpitations, denies any chest pain or shortness of breath,she also has a neuropathy and apparently was on Lyrica as per patient she cannot afford it she is requesting some other medication to help her with the symptoms.  Past Medical History  Diagnosis Date  . Breast cancer   . HTN (hypertension)   . Diabetes mellitus   . Cholelithiasis   . Hepatic steatosis   . Abdominal pain   . History of breast cancer 06/01/2012  . Arthralgia 08/13/2013  . Chest pain 09/30/2013  . Stroke 2006  . Bell's palsy     Past Surgical History  Procedure Laterality Date  . Tubal ligation    . Breast lumpectomy  2010  . Left heart catheterization with coronary angiogram N/A 10/30/2013    Procedure: LEFT HEART CATHETERIZATION WITH CORONARY ANGIOGRAM;  Surgeon: Josue Hector, MD;  Location: [facility] CATH LAB;  Service: Cardiovascular;  Laterality: N/A;      Medication List       This list is accurate as of: 04/21/15  1:16 PM.  Always use your most recent med list.               albuterol 108 (90 BASE) MCG/ACT inhaler  Commonly known as:  PROVENTIL HFA;VENTOLIN HFA  Inhale 1-2 puffs into the lungs every 6 (six) hours as needed for wheezing.     aspirin 81 MG chewable tablet  Chew by mouth daily.     atorvastatin 10 MG tablet  Commonly known as:  LIPITOR  Take 1 tablet (10 mg total) by mouth daily.     azithromycin 250 MG tablet  Commonly known as:  ZITHROMAX Z-PAK  1 Tablet daily for 5 days     carvedilol 6.25 MG tablet  Commonly known as:  COREG  Take 1 tablet (6.25 mg total) by mouth 2  (two) times daily with a meal.     gabapentin 100 MG capsule  Commonly known as:  NEURONTIN  Take 1 capsule (100 mg total) by mouth 3 (three) times daily.     guaiFENesin-codeine 100-10 MG/5ML syrup  Take 5 mLs by mouth 3 (three) times daily as needed for cough.     HYDROcodone-acetaminophen 5-325 MG per tablet  Commonly known as:  NORCO/VICODIN  Take 2 tablets by mouth every 4 (four) hours as needed.     ibuprofen 200 MG tablet  Commonly known as:  ADVIL,MOTRIN  Take 800 mg by mouth every 4 (four) hours as needed (pain).     losartan 50 MG tablet  Commonly known as:  COZAAR  Take 1 tablet (50 mg total) by mouth daily.     metFORMIN 1000 MG tablet  Commonly known as:  GLUCOPHAGE  TAKE 1 TABLET BY MOUTH 2 TIMES DAILY WITH A MEAL.     pregabalin 50 MG capsule  Commonly known as:  LYRICA  Take 1 capsule (50 mg total) by mouth 3 (three) times daily.        Meds ordered this encounter  Medications  . gabapentin (NEURONTIN) 100 MG capsule    Sig: Take 1 capsule (100 mg total) by mouth 3 (  three) times daily.    Dispense:  90 capsule    Refill:  3    Immunization History  Administered Date(s) Administered  . Influenza,inj,Quad PF,36+ Mos 08/21/2014    Family History  Problem Relation Age of Onset  . Thyroid cancer Sister   . Hypertension Mother   . Diabetes Mother   . Migraines Daughter     History  Substance Use Topics  . Smoking status: Never Smoker   . Smokeless tobacco: Never Used  . Alcohol Use: No    Review of Systems   As noted in HPI  Filed Vitals:   04/21/15 1137  BP: 145/78  Pulse: 86  Temp: 98 F (36.7 C)  Resp: 16    Physical Exam  Physical Exam  Constitutional: No distress.  Eyes: EOM are normal. Pupils are equal, round, and reactive to light.  Cardiovascular: Normal rate and regular rhythm.   Pulmonary/Chest: Breath sounds normal. No respiratory distress. She has no wheezes. She has no rales.  Abdominal: Soft. There is no  tenderness. There is no rebound.  Musculoskeletal: She exhibits no edema.    CBC    Component Value Date/Time   WBC 6.4 01/29/2015 2306   WBC 5.8 09/30/2013 1033   RBC 4.54 01/29/2015 2306   RBC 4.42 09/30/2013 1033   HGB 13.7 01/29/2015 2306   HGB 13.4 09/30/2013 1033   HCT 39.2 01/29/2015 2306   HCT 39.1 09/30/2013 1033   PLT 183 01/29/2015 2306   PLT 150 09/30/2013 1033   MCV 86.3 01/29/2015 2306   MCV 88.5 09/30/2013 1033   LYMPHSABS 1.9 09/08/2014 1018   LYMPHSABS 2.1 09/30/2013 1033   MONOABS 0.5 09/08/2014 1018   MONOABS 0.4 09/30/2013 1033   EOSABS 0.3 09/08/2014 1018   EOSABS 0.2 09/30/2013 1033   BASOSABS 0.0 09/08/2014 1018   BASOSABS 0.0 09/30/2013 1033    CMP     Component Value Date/Time   NA 137 01/29/2015 2306   NA 139 09/30/2013 1033   K 3.4* 01/29/2015 2306   K 4.2 09/30/2013 1033   CL 102 01/29/2015 2306   CL 107 01/28/2013 0854   CO2 27 01/29/2015 2306   CO2 22 09/30/2013 1033   GLUCOSE 283* 01/29/2015 2306   GLUCOSE 202* 04/18/2014 0853   GLUCOSE 243* 01/28/2013 0854   BUN 12 01/29/2015 2306   BUN 13.0 09/30/2013 1033   CREATININE 0.60 01/29/2015 2306   CREATININE 0.94 01/24/2014 1510   CREATININE 0.8 09/30/2013 1033   CALCIUM 9.2 01/29/2015 2306   CALCIUM 9.9 09/30/2013 1033   PROT 7.3 01/29/2015 2306   PROT 7.5 09/30/2013 1033   ALBUMIN 3.7 01/29/2015 2306   ALBUMIN 3.7 09/30/2013 1033   AST 60* 01/29/2015 2306   AST 50* 09/30/2013 1033   ALT 41* 01/29/2015 2306   ALT 45 09/30/2013 1033   ALKPHOS 157* 01/29/2015 2306   ALKPHOS 108 09/30/2013 1033   BILITOT 0.7 01/29/2015 2306   BILITOT 0.89 09/30/2013 1033   GFRNONAA >90 01/29/2015 2306   GFRNONAA 68 01/24/2014 1510   GFRAA >90 01/29/2015 2306   GFRAA 78 01/24/2014 1510    Lab Results  Component Value Date/Time   CHOL 155 04/18/2014 08:55 AM    Lab Results  Component Value Date/Time   HGBA1C 7.30 03/30/2015 11:02 AM   HGBA1C 6.8* 04/18/2014 08:55 AM    Lab Results    Component Value Date/Time   AST 60* 01/29/2015 11:06 PM   AST 50* 09/30/2013 10:33 AM  Assessment and Plan  Other specified diabetes mellitus without complications - Plan:  Results for orders placed or performed in visit on 04/21/15  Glucose (CBG)  Result Value Ref Range   POC Glucose 240 (A) 70 - 99 mg/dl   Last hemoglobin A1c was 7.2%, she'll continue with current meds we'll check A1c on the following visit  Neuropathy - Plan: patient cannot afford Lyrica, trial of gabapentin (NEURONTIN) 100 MG capsule  Essential hypertension - Plan: blood pressure is borderline elevated, advise patient for DASH diet COMPLETE METABOLIC PANEL WITH GFR  CAD/ Hyperlipidemia Currently patient is on Lipitor 10 mg, she will reduce the dose to half , ?myalgias secondary to the medication, she'll follow up with her cardiologist, advise patient to continue with Coreg  Myalgia - Plan: COMPLETE METABOLIC PANEL WITH GFR, CK   Return in about 3 months (around 07/22/2015), or if symptoms worsen or fail to improve.   This note has been created with Surveyor, quantity. Any transcriptional errors are unintentional.    [name], MD

## 2015-04-22 ENCOUNTER — Telehealth: Payer: Self-pay

## 2015-04-22 NOTE — Telephone Encounter (Signed)
In house interpreter used Patient not available Left message on voice mail to return our call

## 2015-04-22 NOTE — Telephone Encounter (Signed)
Interpreter used-Belen Returned patient phone  Call and patient is aware of her lab results

## 2015-04-22 NOTE — Telephone Encounter (Signed)
Patient called returning nurse's call to review results  °

## 2015-04-22 NOTE — Telephone Encounter (Signed)
-----   Message from [name], MD sent at 04/22/2015  9:35 AM EDT ----- Call and let the patient know that her potassium level is in normal range, also noticed her LFTs are improving but still borderline elevated liver enzymes, advise patient to avoid alcohol and Tylenol. Will repeat LFTs on the following visit.

## 2015-04-24 ENCOUNTER — Ambulatory Visit: Payer: Self-pay

## 2015-05-14 ENCOUNTER — Telehealth: Payer: Self-pay

## 2015-05-14 ENCOUNTER — Other Ambulatory Visit: Payer: Self-pay | Admitting: Internal Medicine

## 2015-05-14 ENCOUNTER — Telehealth: Payer: Self-pay | Admitting: Internal Medicine

## 2015-05-14 NOTE — Telephone Encounter (Signed)
Patient said she was referred to a cardiologist but never received a call to schedule this appt. Please follow up with pt. 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

## 2015-05-14 NOTE — Telephone Encounter (Signed)
Pt was referred to cardiologist in May and never received a call to schedule her appt. Please follow up with pt. Thank you. She is a Paediatric nurse.

## 2015-05-14 NOTE — Telephone Encounter (Signed)
Tried to call patient Number on file does not accept incoming calls

## 2015-06-15 ENCOUNTER — Ambulatory Visit: Payer: Self-pay | Attending: Internal Medicine

## 2015-07-13 ENCOUNTER — Encounter (HOSPITAL_COMMUNITY): Payer: Self-pay | Admitting: Emergency Medicine

## 2015-07-13 ENCOUNTER — Emergency Department (HOSPITAL_COMMUNITY)
Admission: EM | Admit: 2015-07-13 | Discharge: 2015-07-14 | Disposition: A | Discharge status: Home / Self Care | Payer: Self-pay | Attending: Emergency Medicine | Admitting: Emergency Medicine

## 2015-07-13 DIAGNOSIS — Z7982 Long term (current) use of aspirin: Secondary | ICD-10-CM | POA: Insufficient documentation

## 2015-07-13 DIAGNOSIS — Z79899 Other long term (current) drug therapy: Secondary | ICD-10-CM | POA: Insufficient documentation

## 2015-07-13 DIAGNOSIS — R4701 Aphasia: Secondary | ICD-10-CM | POA: Insufficient documentation

## 2015-07-13 DIAGNOSIS — M62838 Other muscle spasm: Secondary | ICD-10-CM | POA: Insufficient documentation

## 2015-07-13 DIAGNOSIS — E119 Type 2 diabetes mellitus without complications: Secondary | ICD-10-CM | POA: Insufficient documentation

## 2015-07-13 DIAGNOSIS — R200 Anesthesia of skin: Secondary | ICD-10-CM | POA: Insufficient documentation

## 2015-07-13 DIAGNOSIS — Z8719 Personal history of other diseases of the digestive system: Secondary | ICD-10-CM | POA: Insufficient documentation

## 2015-07-13 DIAGNOSIS — H9201 Otalgia, right ear: Secondary | ICD-10-CM | POA: Insufficient documentation

## 2015-07-13 DIAGNOSIS — M25511 Pain in right shoulder: Secondary | ICD-10-CM | POA: Insufficient documentation

## 2015-07-13 DIAGNOSIS — I10 Essential (primary) hypertension: Secondary | ICD-10-CM | POA: Insufficient documentation

## 2015-07-13 DIAGNOSIS — Z792 Long term (current) use of antibiotics: Secondary | ICD-10-CM | POA: Insufficient documentation

## 2015-07-13 DIAGNOSIS — Z8673 Personal history of transient ischemic attack (TIA), and cerebral infarction without residual deficits: Secondary | ICD-10-CM | POA: Insufficient documentation

## 2015-07-13 DIAGNOSIS — Z9889 Other specified postprocedural states: Secondary | ICD-10-CM | POA: Insufficient documentation

## 2015-07-13 DIAGNOSIS — G4489 Other headache syndrome: Secondary | ICD-10-CM | POA: Insufficient documentation

## 2015-07-13 DIAGNOSIS — Z853 Personal history of malignant neoplasm of breast: Secondary | ICD-10-CM | POA: Insufficient documentation

## 2015-07-13 LAB — COMPREHENSIVE METABOLIC PANEL
ALBUMIN: 3.6 g/dL (ref 3.5–5.0)
ALK PHOS: 110 U/L (ref 38–126)
ALT: 29 U/L (ref 14–54)
AST: 50 U/L — ABNORMAL HIGH (ref 15–41)
Anion gap: 9 (ref 5–15)
BUN: 10 mg/dL (ref 6–20)
CALCIUM: 9.1 mg/dL (ref 8.9–10.3)
CO2: 24 mmol/L (ref 22–32)
CREATININE: 0.67 mg/dL (ref 0.44–1.00)
Chloride: 106 mmol/L (ref 101–111)
GFR calc Af Amer: >60 mL/min (ref >60)
GFR calc non Af Amer: >60 mL/min (ref >60)
GLUCOSE: 145 mg/dL — AB (ref 65–99)
Potassium: 3.7 mmol/L (ref 3.5–5.1)
SODIUM: 139 mmol/L (ref 135–145)
Total Bilirubin: 0.7 mg/dL (ref 0.3–1.2)
Total Protein: 6.7 g/dL (ref 6.5–8.1)

## 2015-07-13 LAB — PROTIME-INR
INR: 1.16 (ref 0.00–1.49)
PROTHROMBIN TIME: 15 s (ref 11.6–15.2)

## 2015-07-13 LAB — I-STAT CHEM 8, ED
BUN: 13 mg/dL (ref 6–20)
CHLORIDE: 106 mmol/L (ref 101–111)
CREATININE: 0.6 mg/dL (ref 0.44–1.00)
Calcium, Ion: 1.15 mmol/L (ref 1.12–1.23)
GLUCOSE: 144 mg/dL — AB (ref 65–99)
HCT: 43 % (ref 36.0–46.0)
Hemoglobin: 14.6 g/dL (ref 12.0–15.0)
POTASSIUM: 3.6 mmol/L (ref 3.5–5.1)
Sodium: 141 mmol/L (ref 135–145)
TCO2: 22 mmol/L (ref 0–100)

## 2015-07-13 LAB — CBC
HCT: 38.5 % (ref 36.0–46.0)
Hemoglobin: 13.4 g/dL (ref 12.0–15.0)
MCH: 30.7 pg (ref 26.0–34.0)
MCHC: 34.8 g/dL (ref 30.0–36.0)
MCV: 88.1 fL (ref 78.0–100.0)
PLATELETS: 154 10*3/uL (ref 150–400)
RBC: 4.37 MIL/uL (ref 3.87–5.11)
RDW: 12.5 % (ref 11.5–15.5)
WBC: 5.6 10*3/uL (ref 4.0–10.5)

## 2015-07-13 LAB — DIFFERENTIAL
Basophils Absolute: 0 10*3/uL (ref 0.0–0.1)
Basophils Relative: 0 % (ref 0–1)
Eosinophils Absolute: 0.3 10*3/uL (ref 0.0–0.7)
Eosinophils Relative: 6 % — ABNORMAL HIGH (ref 0–5)
LYMPHS PCT: 36 % (ref 12–46)
Lymphs Abs: 2 10*3/uL (ref 0.7–4.0)
Monocytes Absolute: 0.5 10*3/uL (ref 0.1–1.0)
Monocytes Relative: 9 % (ref 3–12)
NEUTROS ABS: 2.7 10*3/uL (ref 1.7–7.7)
NEUTROS PCT: 49 % (ref 43–77)

## 2015-07-13 LAB — APTT: aPTT: 31 seconds (ref 24–37)

## 2015-07-13 LAB — I-STAT TROPONIN, ED: Troponin i, poc: 0 ng/mL (ref 0.00–0.08)

## 2015-07-13 LAB — CBG MONITORING, ED: GLUCOSE-CAPILLARY: 132 mg/dL — AB (ref 65–99)

## 2015-07-13 NOTE — ED Notes (Addendum)
Pt reports to the ED for eval of right arm pain, right ear pain, and headaches. She reports she developed the ear pain and headache yesterday and she developed the arm pain around 10 am after working. She does housekeeping work. The arm pain starts in her neck/trapezius area and radiates into her right upper arm. CMS intact. Full ROM and strength limited by pain. Reports this pain is similar to the last time she had a stroke. Pt A&Ox4, resp e/u, and skin warm and dry.

## 2015-07-13 NOTE — ED Provider Notes (Signed)
CSN: [id number]   Arrival date & time 07/13/15 2139  History  This chart was scribed for Ripley Fraise, MD by Altamease Oiler, ED Scribe. This patient was seen in room A07C/A07C and the patient's care was started at 11:54 PM.  Chief Complaint  Patient presents with  . Arm Pain  . Headache  . Otalgia    HPI Patient is a 58 y.o. female presenting with arm pain and headaches. The history is provided by the patient. A language interpreter was used [phone]).  Arm Pain This is a new problem. The current episode started 6 to 12 hours ago. The problem occurs constantly. The problem has not changed since onset.Associated symptoms include headaches. Pertinent negatives include no chest pain, no abdominal pain and no shortness of breath. The symptoms are aggravated by exertion. Nothing relieves the symptoms. She has tried nothing for the symptoms.  Headache Pain location:  Generalized Pain scale at highest: Severe. Onset quality:  Gradual Duration:  1 day Timing:  Constant Progression:  Unchanged Chronicity:  New Similar to prior headaches: yes   Relieved by:  Nothing Worsened by:  Nothing Ineffective treatments: OTC pain medication. Associated symptoms: ear pain, neck pain (right-sided) and numbness (RLE)   Associated symptoms: no abdominal pain, no back pain, no eye pain, no focal weakness, no visual change and no weakness     Brandi Bryant is a 59 y.o. female with PMHx of stroke, Bell's Palsy, HTN, and DM who presents to the Emergency Department complaining of a severe and constant headache with gradual onset yesterday. She had similar pain in the past with a stroke. No pain relief from OTC pain medication. Associated symptoms include an episode of difficulty speaking earlier today (her tongue felt heavy/dry), right ear pain, right shoulder pain that is worse with movement, right neck pain, and diffuse right leg/foot numbness since this afternoon. Pt denies fever, change in  vision, chest pain, abdominal pain, right arm numbness, right arm weakness, lower back pain, bowel or bladder incontinence, and facial numbness. No facial asymmetry or focal weakness noted by the patient's daughter over the las day. No recent falls or other trauma.   Past Medical History  Diagnosis Date  . Breast cancer   . HTN (hypertension)   . Diabetes mellitus   . Cholelithiasis   . Hepatic steatosis   . Abdominal pain   . History of breast cancer 06/01/2012  . Arthralgia 08/13/2013  . Chest pain 09/30/2013  . Stroke 2006  . Bell's palsy     Past Surgical History  Procedure Laterality Date  . Tubal ligation    . Breast lumpectomy  2010  . Left heart catheterization with coronary angiogram N/A 10/30/2013    Procedure: LEFT HEART CATHETERIZATION WITH CORONARY ANGIOGRAM;  Surgeon: Josue Hector, MD;  Location: [facility] CATH LAB;  Service: Cardiovascular;  Laterality: N/A;    Family History  Problem Relation Age of Onset  . Thyroid cancer Sister   . Hypertension Mother   . Diabetes Mother   . Migraines Daughter     Social History  Substance Use Topics  . Smoking status: Never Smoker   . Smokeless tobacco: Never Used  . Alcohol Use: No     Review of Systems  HENT: Positive for ear pain.   Eyes: Negative for pain.  Respiratory: Negative for shortness of breath.   Cardiovascular: Negative for chest pain.  Gastrointestinal: Negative for abdominal pain.  Musculoskeletal: Positive for arthralgias (Right shoulder) and neck pain (right-sided). Negative for  back pain.  Neurological: Positive for speech difficulty, numbness (RLE) and headaches. Negative for focal weakness, facial asymmetry and weakness.  All other systems reviewed and are negative.   Home Medications   Prior to Admission medications   Medication Sig Start Date End Date Taking? Authorizing Provider  albuterol (PROVENTIL HFA;VENTOLIN HFA) 108 (90 BASE) MCG/ACT inhaler Inhale 1-2 puffs into the lungs every 6 (six)  hours as needed for wheezing. 01/30/15   Tanna Furry, MD  aspirin 81 MG chewable tablet Chew by mouth daily.    Historical Provider, MD  atorvastatin (LIPITOR) 10 MG tablet Take 1 tablet (10 mg total) by mouth daily. 03/30/15   [name], MD  azithromycin (ZITHROMAX Z-PAK) 250 MG tablet 1 Tablet daily for 5 days 09/08/14   Dorothy Spark, MD  carvedilol (COREG) 6.25 MG tablet Take 1 tablet (6.25 mg total) by mouth 2 (two) times daily with a meal. 03/30/15   [name], MD  gabapentin (NEURONTIN) 100 MG capsule Take 1 capsule (100 mg total) by mouth 3 (three) times daily. 04/21/15   [name], MD  guaiFENesin-codeine 100-10 MG/5ML syrup Take 5 mLs by mouth 3 (three) times daily as needed for cough. 09/08/14   Dorothy Spark, MD  HYDROcodone-acetaminophen (NORCO/VICODIN) 5-325 MG per tablet Take 2 tablets by mouth every 4 (four) hours as needed. 01/30/15   Tanna Furry, MD  ibuprofen (ADVIL,MOTRIN) 200 MG tablet Take 800 mg by mouth every 4 (four) hours as needed (pain).    Historical Provider, MD  losartan (COZAAR) 50 MG tablet Take 1 tablet (50 mg total) by mouth daily. 03/30/15   [name], MD  metFORMIN (GLUCOPHAGE) 1000 MG tablet TAKE 1 TABLET BY MOUTH 2 TIMES DAILY WITH A MEAL. 03/30/15   [name], MD  pregabalin (LYRICA) 50 MG capsule Take 1 capsule (50 mg total) by mouth 3 (three) times daily. 03/30/15   [name], MD    Allergies  Gadolinium derivatives  Triage Vitals: BP 164/70 mmHg  Pulse 62  Temp(Src) 98.3 F (36.8 C) (Oral)  Resp 14  SpO2 98%  Physical Exam  Nursing note and vitals reviewed. CONSTITUTIONAL: Well developed/well nourished HEAD: Normocephalic/atraumatic EYES: EOMI/PERRL ENMT: Mucous membranes moist NECK: supple no meningeal signs, tenderness to right paraspinal region, no bruising/erythema No bruits noted SPINE/BACK:entire spine nontender CV: S1/S2 noted, no murmurs/rubs/gallops noted LUNGS: Lungs are clear to auscultation bilaterally, no  apparent distress ABDOMEN: soft, nontender, no rebound or guarding, bowel sounds noted throughout abdomen GU:no cva tenderness NEURO: Pt is awake/alert/appropriate, moves all extremitiesx4.  No facial droop.  Limited movement of right UE due to pain.  Equal power (5/5) with hand grip, wrist flex/extension, elbow flex/extension.  Equal (2+) biceps/brachioradialis reflex in bilateral UE No focal weakness noted in lower extremities with equal power with hip flex/extension, knee flex/extension and ankle dorsi/plantar flexion.  She reports diffuse numbness to right LE EXTREMITIES: pulses normal/equal, full ROM.  No deformity or bruising to right shoulder SKIN: warm, color normal PSYCH: no abnormalities of mood noted, alert and oriented to situation  ED Course  Procedures   DIAGNOSTIC STUDIES: Oxygen Saturation is 98% on RA, normal by my interpretation.    COORDINATION OF CARE: 12:06 AM Discussed treatment plan which includes lab work, MRI, and pain management with pt at bedside and pt agreed to plan. tPA in stroke considered but not given due to: Onset over 3-4.5hours  Pt with difficult history, even with interpreter phone She has HA/neck pain and limitation in movement of  right Ue due to pain She reports this is similar to when she had stroke Given history/exam and concern for possible CVA as well as myelopathy, MR imaging ordered  1:57 AM Pt stable, awaiting MRI  4:41 AM MRI negative Pt well appearing She has improved movement of right UE.  No signs of trauma to suggest occult fx to right shoulder She is ambulatory Stable for d/c home  Discussed with patient/daughter need to see PCP She reported numbness to right LE but no weakness, she can ambulate and no incontinence reported Stable for d/c home    Labs Reviewed  DIFFERENTIAL - Abnormal; Notable for the following:    Eosinophils Relative 6 (*)    All other components within normal limits  COMPREHENSIVE METABOLIC PANEL -  Abnormal; Notable for the following:    Glucose, Bld 145 (*)    AST 50 (*)    All other components within normal limits  CBG MONITORING, ED - Abnormal; Notable for the following:    Glucose-Capillary 132 (*)    All other components within normal limits  I-STAT CHEM 8, ED - Abnormal; Notable for the following:    Glucose, Bld 144 (*)    All other components within normal limits  PROTIME-INR  APTT  CBC  I-STAT TROPOININ, ED    Imaging Review Mr Brain Wo Contrast  07/14/2015   CLINICAL DATA:  Initial evaluation for acute neck pain and right arm weakness.  EXAM: MRI HEAD WITHOUT CONTRAST  TECHNIQUE: Multiplanar, multiecho pulse sequences of the brain and surrounding structures were obtained without intravenous contrast.  COMPARISON:  Prior MRI from 02/21/2014.  FINDINGS: Mild diffuse prominence of the CSF containing spaces is compatible is generalized cerebral atrophy. Patchy and confluent T2/FLAIR hyperintensity within the periventricular, deep, and subcortical white matter present, nonspecific, but likely related to moderate chronic small vessel ischemic disease. Remote left occipital lobe infarct noted with associated encephalomalacia. Prominent ovoid lesion at the left basal ganglia follows CSF signal on all pulse sequences, most consistent with a dilated perivascular space.  No abnormal foci of restricted diffusion to suggest acute intracranial infarct. Gray-white matter differentiation maintained. Normal intravascular flow voids are preserved. No acute or chronic intracranial hemorrhage.  No mass lesion, midline shift or mass effect. No hydrocephalus. No extra-axial fluid collection. Craniocervical junction within normal limits. Pituitary gland normal.  No acute abnormality about the orbits.  Mild mucosal thickening within the inferior max O sinuses anterior ethmoidal air cells. Paranasal sinuses are otherwise clear. No mastoid effusion. Inner ear structures grossly normal.  Bone marrow signal  intensity within normal limits. Scalp soft tissues unremarkable.  IMPRESSION: 1. No acute intracranial infarct or other abnormality identified. 2. Generalized cerebral atrophy with moderate chronic small vessel ischemic disease. 3. Remote left occipital lobe infarct.   Electronically Signed   By: Jeannine Boga M.D.   On: 07/14/2015 03:54   Mr Cervical Spine Wo Contrast  07/14/2015   CLINICAL DATA:  Initial evaluation for acute neck pain and right arm weakness.  EXAM: MRI CERVICAL SPINE WITHOUT CONTRAST  TECHNIQUE: Multiplanar, multisequence MR imaging of the cervical spine was performed. No intravenous contrast was administered.  COMPARISON:  Prior study from 01/21/2014.  FINDINGS: Visualized portions of the brain demonstrate a normal appearance with normal signal intensity. The craniocervical junction is widely patent.  Vertebral bodies are normally aligned with preservation of the normal cervical lordosis. No listhesis. Vertebral body heights maintained. No acute fracture. No marrow edema. No focal osseous lesion.  Signal intensity  within the cervical spinal cord is normal.  Paraspinous soft tissues within normal limits. No prevertebral edema.  C2-C3: Negative.  C3-C4: Mild bilateral uncovertebral hypertrophy with minimal disc bulge. No significant stenosis.  C4-C5: Mild bilateral uncovertebral hypertrophy and left-sided facet arthrosis. No significant stenosis.  C5-C6: Mild bilateral uncovertebral hypertrophy with predominately anterior disc bulging and anterior endplate osteophytic spurring. There is very mild if any bilateral neural foraminal narrowing, slightly greater on the right. No significant canal stenosis.  C6-C7: Mild bilateral uncovertebral hypertrophy without significant stenosis.  C7-T1:  Negative.  Small degenerative disc bulge present at T1-2 without significant stenosis. Remainder the visualized upper thoracic spine is unremarkable.  IMPRESSION: 1. Mild degenerative disc disease at C5-6  with resultant very mild if any bilateral foraminal narrowing, slightly more prominent on the right. This is not felt to be significant enough to explain the patient's right upper extremity symptoms. 2. Very mild additional degenerative changes as above without significant stenosis.   Electronically Signed   By: Jeannine Boga M.D.   On: 07/14/2015 04:23    EKG Interpretation  Date/Time:  Monday July 13 2015 23:32:07 EDT Ventricular Rate:  57 PR Interval:  157 QRS Duration: 90 QT Interval:  409 QTC Calculation: 398 R Axis:   24 Text Interpretation:  Sinus rhythm Normal ECG No significant change since last tracing Confirmed by Christy Gentles  MD, Elenore Rota ([id number]) on 07/13/2015 11:41:37 PM       MDM   Final diagnoses:  Other headache syndrome  Cervical paraspinous muscle spasm     Nursing notes including past medical history and social history reviewed and considered in documentation Labs/vital reviewed myself and considered during evaluation   I personally performed the services described in this documentation, which was scribed in my presence. The recorded information has been reviewed and is accurate.    Ripley Fraise, MD 07/14/15 [phone]

## 2015-07-14 ENCOUNTER — Emergency Department (HOSPITAL_COMMUNITY): Payer: Self-pay

## 2015-07-14 MED ORDER — MORPHINE SULFATE (PF) 4 MG/ML IV SOLN
4.0000 mg | Freq: Once | INTRAVENOUS | Status: AC
  Administered 2015-07-14: 4 mg via INTRAVENOUS
  Filled 2015-07-14: qty 1

## 2015-07-14 NOTE — ED Notes (Signed)
Provider bedside, using translator phone.  Pt does have a hx of stoke "last year".  New information per translator: weakness in right leg, speech was hard earlier in the day.

## 2015-07-14 NOTE — Discharge Instructions (Signed)
Terapia con calor (Heat Therapy) La terapia con calor puede ayudar a aliviar articulaciones y msculos doloridos, lesionados, tensos y rgidos. El calor [employer], lo cual puede ayudar a Best boy.  RIESGOS Y COMPLICACIONES Si tiene cualquiera de los siguientes problemas, no utilice la terapia con calor a menos que su mdico lo haya autorizado:  Mala circulacin.  Heridas que se estn curando o piel con cicatrices en la zona a tratar.  Diabetes, enfermedades cardacas o hipertensin arterial.  Incapacidad de sentir (entumecimiento) la zona tratada.  Hinchazn inusual de la zona a tratar.  Infecciones activas.  Cogulos sanguneos.  Cncer.  Incapacidad de Scientist, forensic. Esto puede incluir nios pequeos y personas que tienen problemas con la funcin cerebral (demencia).  Embarazo. La terapia con calor solo se debe usar en lesiones viejas, preexistentes o de larga duracin (crnicas). No utilice la terapia con calor en lesiones nuevas a menos que el mdico se lo indique. CMO USAR LA TERAPIA CON CALOR Existen varios tipos distintos de terapia con calor, como:  Compresas hmedas calientes.  Bao de agua caliente.  Bolsa de agua caliente.  [place]adilla trmica.  Bolsa de gel caliente.  Vendaje caliente.  Almohadilla trmica. Utilice el mtodo de terapia con calor que le sugiera su mdico. Siga las indicaciones del mdico sobre cmo y cundo usar la terapia con Freight forwarder. RECOMENDACIONES GENERALES PARA LA TERAPIA CON CALOR  No duerma mientras [place] la terapia con calor. Utilice la terapia con calor solo mientras est despierto.  La piel puede volverse rosada mientras [place] la terapia con calor. No use la terapia con calor si la piel se pone roja.  No use la terapia con calor si siente un dolor nuevo.  Una temperatura muy alta o una exposicin prolongada al calor puede causar quemaduras. Sea cauto con la terapia de calor para evitar quemar la piel.  No use la  terapia con calor en zonas de la piel que ya estn irritadas, como con una erupcin o una quemadura de sol. SOLICITE ATENCIN MDICA SI:  Observa ampollas, enrojecimiento, hinchazn o adormecimiento.  Siente un dolor nuevo.  El dolor [place]. ASEGRESE DE QUE:  Comprende estas instrucciones.  Controlar su afeccin.  Recibir ayuda de inmediato si no mejora o si empeora. Document Released: 01/30/2012 Document Revised: 03/24/2014 [facility] Patient Information 2015 [place]. This information is not intended to replace advice given to you by your health care provider. Make sure you discuss any questions you have with your health care provider.  Dolor de cabeza general sin causa  (General Headache Without Cause)  Un dolor de cabeza en general es un dolor o malestar que se siente en la zona de la cabeza o del cuello. Se desconocen las causas.  CUIDADOS EN EL HOGAR   Cumpla con los controles mdicos segn las indicaciones.  Tome slo los medicamentos que le haya indicado el mdico.  Cuando sienta dolor de cabeza acustese en un cuarto oscuro y tranquilo.  Lleve un registro diario para averiguar si ciertas cosas provocan dolores de cabeza. Por ejemplo, escriba:  Lo que come y bebe.  Cunto tiempo duerme.  Todo cambio en la dieta o medicamentos.  Reljese recibiendo masajes o haga otras actividades relajantes.  Coloque hielo o calor en la cabeza y el cuello como lo indique su mdico.  DISMINUYA EL NIVEL DE ESTRS  Sintase con la espalda recta. No apriete los msculos (tensione).  Si fuma, deje de hacerlo.  Beba menos alcohol.  Consuma menos cafena o  deje de tomarla.  Coma y duerma en horarios regulares.  Duerma entre 7 y 46 horas o como le indique su [place] luces tenues si le [place] cabeza empeoran. SOLICITE AYUDA DE INMEDIATO SI:   El dolor de [place].  Tiene fiebre.  Presenta rigidez en el  cuello.  Tiene dificultad para ver.  Sus msculos estn dbiles o pierde el control muscular.  Pierde equilibrio o tiene problemas para Writer.  Siente que se desvanece (debilidad) o se desmaya.  Tiene sntomas intensos que son diferentes a los primeros sntomas.  Tiene problemas con los medicamentos que le recet su mdico.  El medicamento no le hace efecto.  Siente que el dolor de [place] es diferente a otros dolores de [place].  Tiene malestar estomacal (nuseas) o (vmitos). ASEGRESE DE QUE:   Comprende estas instrucciones.  Controlar su enfermedad.  Solicitar ayuda de inmediato si no mejora o si empeora. Document Released: 01/30/2012 [facility] Patient Information 2015 [place]. This information is not intended to replace advice given to you by your health care provider. Make sure you discuss any questions you have with your health care provider.

## 2015-08-20 ENCOUNTER — Other Ambulatory Visit: Payer: Self-pay | Admitting: Internal Medicine

## 2015-09-02 ENCOUNTER — Encounter: Payer: Self-pay | Admitting: Internal Medicine

## 2015-09-02 ENCOUNTER — Other Ambulatory Visit: Payer: Self-pay

## 2015-09-02 ENCOUNTER — Emergency Department (HOSPITAL_COMMUNITY): Payer: Self-pay

## 2015-09-02 ENCOUNTER — Ambulatory Visit: Payer: Self-pay | Attending: Internal Medicine | Admitting: Internal Medicine

## 2015-09-02 ENCOUNTER — Ambulatory Visit (HOSPITAL_COMMUNITY)
Admission: RE | Admit: 2015-09-02 | Discharge: 2015-09-02 | Disposition: A | Discharge status: Home / Self Care | Payer: Self-pay | Source: Ambulatory Visit | Attending: Cardiology | Admitting: Cardiology

## 2015-09-02 ENCOUNTER — Encounter (HOSPITAL_COMMUNITY): Payer: Self-pay | Admitting: *Deleted

## 2015-09-02 ENCOUNTER — Emergency Department (HOSPITAL_COMMUNITY)
Admission: EM | Admit: 2015-09-02 | Discharge: 2015-09-02 | Disposition: A | Discharge status: Home / Self Care | Payer: Self-pay | Attending: Physician Assistant | Admitting: Physician Assistant

## 2015-09-02 VITALS — BP 157/83 | HR 75 | Temp 98.0°F | Resp 16 | Ht <= 58 in | Wt 145.0 lb

## 2015-09-02 DIAGNOSIS — Z23 Encounter for immunization: Secondary | ICD-10-CM | POA: Insufficient documentation

## 2015-09-02 DIAGNOSIS — Z8249 Family history of ischemic heart disease and other diseases of the circulatory system: Secondary | ICD-10-CM | POA: Insufficient documentation

## 2015-09-02 DIAGNOSIS — I2510 Atherosclerotic heart disease of native coronary artery without angina pectoris: Secondary | ICD-10-CM | POA: Insufficient documentation

## 2015-09-02 DIAGNOSIS — R42 Dizziness and giddiness: Secondary | ICD-10-CM | POA: Insufficient documentation

## 2015-09-02 DIAGNOSIS — R079 Chest pain, unspecified: Secondary | ICD-10-CM | POA: Insufficient documentation

## 2015-09-02 DIAGNOSIS — E669 Obesity, unspecified: Secondary | ICD-10-CM | POA: Insufficient documentation

## 2015-09-02 DIAGNOSIS — Z8673 Personal history of transient ischemic attack (TIA), and cerebral infarction without residual deficits: Secondary | ICD-10-CM | POA: Insufficient documentation

## 2015-09-02 DIAGNOSIS — R5383 Other fatigue: Secondary | ICD-10-CM | POA: Insufficient documentation

## 2015-09-02 DIAGNOSIS — Z7984 Long term (current) use of oral hypoglycemic drugs: Secondary | ICD-10-CM | POA: Insufficient documentation

## 2015-09-02 DIAGNOSIS — Z6830 Body mass index (BMI) 30.0-30.9, adult: Secondary | ICD-10-CM | POA: Insufficient documentation

## 2015-09-02 DIAGNOSIS — Z833 Family history of diabetes mellitus: Secondary | ICD-10-CM | POA: Insufficient documentation

## 2015-09-02 DIAGNOSIS — I10 Essential (primary) hypertension: Secondary | ICD-10-CM | POA: Insufficient documentation

## 2015-09-02 DIAGNOSIS — Z79899 Other long term (current) drug therapy: Secondary | ICD-10-CM | POA: Insufficient documentation

## 2015-09-02 DIAGNOSIS — Z91041 Radiographic dye allergy status: Secondary | ICD-10-CM | POA: Insufficient documentation

## 2015-09-02 DIAGNOSIS — R0602 Shortness of breath: Secondary | ICD-10-CM | POA: Insufficient documentation

## 2015-09-02 DIAGNOSIS — Z853 Personal history of malignant neoplasm of breast: Secondary | ICD-10-CM | POA: Insufficient documentation

## 2015-09-02 DIAGNOSIS — Z8739 Personal history of other diseases of the musculoskeletal system and connective tissue: Secondary | ICD-10-CM | POA: Insufficient documentation

## 2015-09-02 DIAGNOSIS — E119 Type 2 diabetes mellitus without complications: Secondary | ICD-10-CM | POA: Insufficient documentation

## 2015-09-02 DIAGNOSIS — Z8669 Personal history of other diseases of the nervous system and sense organs: Secondary | ICD-10-CM | POA: Insufficient documentation

## 2015-09-02 DIAGNOSIS — Z9889 Other specified postprocedural states: Secondary | ICD-10-CM | POA: Insufficient documentation

## 2015-09-02 HISTORY — DX: Atherosclerotic heart disease of native coronary artery without angina pectoris: I25.10

## 2015-09-02 LAB — CBC
HCT: 38.3 % (ref 36.0–46.0)
Hemoglobin: 13.3 g/dL (ref 12.0–15.0)
MCH: 29.9 pg (ref 26.0–34.0)
MCHC: 34.7 g/dL (ref 30.0–36.0)
MCV: 86.1 fL (ref 78.0–100.0)
Platelets: 150 10*3/uL (ref 150–400)
RBC: 4.45 MIL/uL (ref 3.87–5.11)
RDW: 12.3 % (ref 11.5–15.5)
WBC: 6.3 10*3/uL (ref 4.0–10.5)

## 2015-09-02 LAB — BASIC METABOLIC PANEL
Anion gap: 11 (ref 5–15)
BUN: 12 mg/dL (ref 6–20)
CALCIUM: 9.2 mg/dL (ref 8.9–10.3)
CHLORIDE: 100 mmol/L — AB (ref 101–111)
CO2: 25 mmol/L (ref 22–32)
CREATININE: 0.69 mg/dL (ref 0.44–1.00)
GFR calc non Af Amer: >60 mL/min (ref >60)
Glucose, Bld: 181 mg/dL — ABNORMAL HIGH (ref 65–99)
Potassium: 3.4 mmol/L — ABNORMAL LOW (ref 3.5–5.1)
SODIUM: 136 mmol/L (ref 135–145)

## 2015-09-02 LAB — GLUCOSE, POCT (MANUAL RESULT ENTRY): POC Glucose: 211 mg/dl — AB (ref 70–99)

## 2015-09-02 LAB — POCT GLYCOSYLATED HEMOGLOBIN (HGB A1C): HEMOGLOBIN A1C: 6.9

## 2015-09-02 LAB — I-STAT TROPONIN, ED
Troponin i, poc: 0.01 ng/mL (ref 0.00–0.08)
Troponin i, poc: 0 ng/mL (ref 0.00–0.08)

## 2015-09-02 MED ORDER — ASPIRIN 325 MG PO TABS
325.0000 mg | ORAL_TABLET | Freq: Every day | ORAL | Status: DC
  Administered 2015-09-02: 325 mg via ORAL

## 2015-09-02 MED ORDER — ACETAMINOPHEN 325 MG PO TABS
650.0000 mg | ORAL_TABLET | Freq: Once | ORAL | Status: AC
  Administered 2015-09-02: 650 mg via ORAL
  Filled 2015-09-02: qty 2

## 2015-09-02 MED ORDER — GI COCKTAIL ~~LOC~~
30.0000 mL | Freq: Once | ORAL | Status: AC
  Administered 2015-09-02: 30 mL via ORAL
  Filled 2015-09-02: qty 30

## 2015-09-02 MED ORDER — IBUPROFEN 800 MG PO TABS
800.0000 mg | ORAL_TABLET | Freq: Once | ORAL | Status: AC
  Administered 2015-09-02: 800 mg via ORAL
  Filled 2015-09-02: qty 1

## 2015-09-02 NOTE — ED Notes (Signed)
Pt is coming from [place] with sob for 2weeks and then developed chest pain.  Pt has had 324mg  ASA.  Pt had 2 tabs sl ntg.  Pain improved with nitro.

## 2015-09-02 NOTE — Discharge Instructions (Signed)
Dolor en la pared torcica (Chest Wall Pain) El dolor en la pared torcica se produce en los huesos y los msculos del pecho o alrededor de Orthoptist. A veces, una lesin Arts administrator. En ocasiones, la causa puede ser desconocida. Este dolor puede durar varias semanas. INSTRUCCIONES PARA EL CUIDADO EN EL HOGAR  Est atento a cualquier cambio en los sntomas. Tome estas medidas para Theatre stage manager dolor:   Haga reposo como se lo haya indicado el [place] actividades que causan dolor. Estas pueden ser [employer] requieren el uso de los msculos del trax, los abdominales o los laterales para levantar objetos pesados.   Si se lo indican, aplique hielo sobre la zona dolorida:  Ponga el hielo en una bolsa plstica.  Coloque una toalla entre la piel y la bolsa de hielo.  Coloque el hielo durante 59minutos, 2 a 3veces por Training and development officer.  Tome los medicamentos de venta libre y los recetados solamente como se lo haya indicado el mdico.  No consuma productos que contengan tabaco, incluidos cigarrillos, tabaco de Higher education careers adviser y Psychologist, sport and exercise. Si necesita ayuda para dejar de fumar, consulte al mdico.  Concurra a todas las visitas de control como se lo haya indicado el mdico. Esto es importante. SOLICITE ATENCIN MDICA SI:  Brandi Bryant.  El dolor de [place].  Aparecen nuevos sntomas. SOLICITE ATENCIN MDICA DE INMEDIATO SI:  Tiene nuseas o vmitos.  Brandi Bryant o tiene sensacin de desvanecimiento.  Tiene tos con flema (esputo) o expectora sangre al toser.  Le falta el aire.   Esta informacin no tiene Marine scientist el consejo del mdico. Asegrese de hacerle al mdico cualquier pregunta que tenga.   Document Released: 12/19/2006 Document Revised: 07/29/2015 Elsevier Interactive Patient Education 2016 [place] en la pared torcica (Chest Wall Pain) El dolor en la pared torcica se produce en los huesos y los msculos del pecho o alrededor de  Orthoptist. A veces, una lesin Arts administrator. En ocasiones, la causa puede ser desconocida. Este dolor puede durar varias semanas. CUIDADOS EN EL HOGAR Est atento a cualquier cambio en los sntomas. Tome estas medidas para Theatre stage manager dolor:  Secondary school teacher reposo tal como le indic el mdico.  Evite las actividades que causan dolor. Intente no usar los msculos del trax, los del vientre (abdominales) o los laterales para levantar objetos pesados.  Si se lo indican, aplique hielo sobre la zona dolorida:  Ponga el hielo en una bolsa plstica.  Coloque una toalla entre la piel y la bolsa de hielo.  Coloque el hielo durante 60minutos, 2 a 3veces por Training and development officer.  Tome los medicamentos de venta libre y los recetados solamente como se lo haya indicado el mdico.  No consuma productos que contengan tabaco, incluidos cigarrillos, tabaco de Higher education careers adviser y Psychologist, sport and exercise. Si necesita ayuda para dejar de fumar, consulte al mdico.  Concurra a todas las visitas de control como se lo haya indicado el mdico. Esto es importante. SOLICITE AYUDA SI:  Tiene fiebre.  El dolor en el pecho [place].  Aparecen nuevos sntomas. SOLICITE AYUDA DE INMEDIATO SI:  Tiene malestar estomacal (nuseas) o vomita.  Brandi Bryant o tiene sensacin de desvanecimiento.  Tiene tos con flema (esputo) o expectora sangre al toser.  Comienza a sentir falta de [place].   Esta informacin no tiene Marine scientist el consejo del mdico. Asegrese de hacerle al mdico cualquier pregunta que tenga.   Document Released: 10/27/2011 Document Revised: 07/29/2015 Elsevier Interactive Patient Education 2016 Elsevier  Inc. ° °

## 2015-09-02 NOTE — ED Notes (Signed)
Pt c/o lip numbness, headache and L leg numbness. Famiyl reports that this has been ongoignf "for a while." MD notified. Neuro check intact.

## 2015-09-02 NOTE — ED Notes (Signed)
To x-ray

## 2015-09-02 NOTE — ED Notes (Signed)
Patient left at this time with all belongings. 

## 2015-09-02 NOTE — ED Notes (Signed)
Pt returned from xray

## 2015-09-02 NOTE — Progress Notes (Signed)
Patient ID: Brandi Bryant, female   DOB: [birth year], 59 y.o.   MRN: [id number]  CC: chest pain  HPI: Brandi Bryant is a 59 y.o. female here today for a follow up visit.  Patient has past medical history of breast cancer, HTN, DM, CVA, and bell's palsy. Patient reports that for the past 3 weeks she has been having symptoms of chest pressure. She reports that she is having mild chest pain currently that feels like a pressure or someone pressing on her chest with some SOB. It is located in her left sternum. Pain last all afternoon. She denies any heavy lifting or increased stress.  She reports that she had a cardiac cath in 2014 that revealed CAD. Review of records reveal that patient had mild non-obstructive LAD. She states that the pain is not relieved with rest and it is more intense with a deep breath. She is concerned because she is now having numbness in mouth for past 5 days without any relief.     Allergies  Allergen Reactions  . Gadolinium Derivatives     Code: VOM, Desc: Pt began vomiting 45 sec after MRI contrast injection of Multihance, Onset Date: 16010932     Past Medical History  Diagnosis Date  . Breast cancer ([place])   . HTN (hypertension)   . Diabetes mellitus   . Cholelithiasis   . Hepatic steatosis   . Abdominal pain   . History of breast cancer 06/01/2012  . Arthralgia 08/13/2013  . Chest pain 09/30/2013  . Stroke [facility]) 2006  . Bell's palsy    Current Outpatient Prescriptions on File Prior to Visit  Medication Sig Dispense Refill  . albuterol (PROVENTIL HFA;VENTOLIN HFA) 108 (90 BASE) MCG/ACT inhaler Inhale 1-2 puffs into the lungs every 6 (six) hours as needed for wheezing. 1 Inhaler 0  . atorvastatin (LIPITOR) 10 MG tablet Take 1 tablet (10 mg total) by mouth daily. 90 tablet 10  . carvedilol (COREG) 6.25 MG tablet Take 1 tablet (6.25 mg total) by mouth 2 (two) times daily with a meal. (Patient taking differently: Take 6.25 mg by mouth daily. ) 180  tablet 10  . gabapentin (NEURONTIN) 100 MG capsule Take 1 capsule (100 mg total) by mouth 3 (three) times daily. (Patient taking differently: Take 100 mg by mouth 2 (two) times daily. ) 90 capsule 3  . losartan (COZAAR) 50 MG tablet Take 1 tablet (50 mg total) by mouth daily. 30 tablet 3  . metFORMIN (GLUCOPHAGE) 1000 MG tablet TAKE 1 TABLET BY MOUTH 2 TIMES DAILY WITH A MEAL. 60 tablet 0  . guaiFENesin-codeine 100-10 MG/5ML syrup Take 5 mLs by mouth 3 (three) times daily as needed for cough. 120 mL 0  . ibuprofen (ADVIL,MOTRIN) 200 MG tablet Take 400-800 mg by mouth every 4 (four) hours as needed for moderate pain.     . [DISCONTINUED] escitalopram (LEXAPRO) 10 MG tablet Take 10 mg by mouth daily.     No current facility-administered medications on file prior to visit.   Family History  Problem Relation Age of Onset  . Thyroid cancer Sister   . Hypertension Mother   . Diabetes Mother   . Migraines Daughter    Social History   Social History  . Marital Status: Single    Spouse Name: N/A  . Number of Children: N/A  . Years of Education: N/A   Occupational History  . Not on file.   Social History Main Topics  . Smoking status: Never Smoker   .  Smokeless tobacco: Never Used  . Alcohol Use: No  . Drug Use: No  . Sexual Activity: Not Currently    Birth Control/ Protection: Surgical   Other Topics Concern  . Not on file   Social History Narrative    Review of Systems  Constitutional: Positive for malaise/fatigue.  Respiratory: Positive for shortness of breath. Negative for cough.   Cardiovascular: Positive for chest pain. Negative for palpitations and leg swelling.  Neurological: Positive for weakness. Negative for dizziness.  All other systems reviewed and are negative.  Objective:   Filed Vitals:   09/02/15 1528  BP: 157/83  Pulse: 75  Temp: 98 F (36.7 C)  Resp: 16    Physical Exam  Constitutional: She is oriented to person, place, and time. No distress.   Cardiovascular: Normal rate, regular rhythm and normal heart sounds.   No murmur heard. Pulmonary/Chest: Effort normal and breath sounds normal. No respiratory distress. She exhibits no tenderness.  Neurological: She is alert and oriented to person, place, and time.  Skin: Skin is warm and dry. She is not diaphoretic.     Lab Results  Component Value Date   WBC 5.6 07/13/2015   HGB 14.6 07/13/2015   HCT 43.0 07/13/2015   MCV 88.1 07/13/2015   PLT 154 07/13/2015   Lab Results  Component Value Date   CREATININE 0.60 07/13/2015   BUN 13 07/13/2015   NA 141 07/13/2015   K 3.6 07/13/2015   CL 106 07/13/2015   CO2 24 07/13/2015    Lab Results  Component Value Date   HGBA1C 6.90 09/02/2015   Lipid Panel     Component Value Date/Time   CHOL 155 04/18/2014 0855   TRIG 129 04/18/2014 0855   HDL 47 04/18/2014 0855   CHOLHDL 3.3 04/18/2014 0855   VLDL 26 04/18/2014 0855   LDLCALC 82 04/18/2014 0855       Assessment and plan:   Brandi Bryant was seen today for chest pain.  Diagnoses and all orders for this visit:  Chest pain, unspecified chest pain type/CAD -     EKG 12-Lead EKG: normal EKG, normal sinus rhythm Patient has several risk factors including diabetes mellitus, obesity, CAD, and HTN. Patient will be transferred to ER via EMS for further evaluation.  Patient given Aspirin 325 mg to chew prior to transport  Type 2 diabetes mellitus without complication, without long-term current use of insulin (HCC) -     Glucose (CBG) -     HgB A1c -     Flu Vaccine QUAD 36+ mos PF IM (Fluarix & Fluzone Quad PF) Will address on follow up visit  Return for follow up after hospital discharge.       Lance Bosch, [facility] [phone] 09/02/2015, 3:39 PM

## 2015-09-02 NOTE — Progress Notes (Signed)
Interpreter used Brandi Bryant Patient complains of having pressure in her chest the past three month Patient complains of also feeling tired and having some SOB Patient also complains of having some numbness in her mouth the past five days

## 2015-09-02 NOTE — ED Provider Notes (Signed)
CSN: [id number]     Arrival date & time 09/02/15  1645 History   First MD Initiated Contact with Patient 09/02/15 1659     Chief Complaint  Patient presents with  . Chest Pain  . Shortness of Breath     (Consider location/radiation/quality/duration/timing/severity/associated sxs/prior Treatment) HPI   Patient is a 59 year old Hispanic female with past medical history significant for CAD, diabetes, stroke, breast cancer, hypertension presenting today with chest pain for the last several weeks. It is not made worse by exertion. It is made worse by stress. Occasionally it worse just at rest. Patient says it's vacillates between being strong and soft.  The chest pain is across her whole anterior chest wall. No diaphoresis associated with the pain. Does not move upper jaw or down her arm.  Patient was sent here from her primary care office where she was having a follow-up appointment and mention this chest pain.  Patient has not had any fever cough   Past Medical History  Diagnosis Date  . Breast cancer ([place])   . HTN (hypertension)   . Diabetes mellitus   . Cholelithiasis   . Hepatic steatosis   . Abdominal pain   . History of breast cancer 06/01/2012  . Arthralgia 08/13/2013  . Chest pain 09/30/2013  . Stroke [facility]) 2006  . Bell's palsy   . Coronary artery disease    Past Surgical History  Procedure Laterality Date  . Tubal ligation    . Breast lumpectomy  2010  . Left heart catheterization with coronary angiogram N/A 10/30/2013    Procedure: LEFT HEART CATHETERIZATION WITH CORONARY ANGIOGRAM;  Surgeon: Josue Hector, MD;  Location: [facility] CATH LAB;  Service: Cardiovascular;  Laterality: N/A;   Family History  Problem Relation Age of Onset  . Thyroid cancer Sister   . Hypertension Mother   . Diabetes Mother   . Migraines Daughter    Social History  Substance Use Topics  . Smoking status: Never Smoker   . Smokeless tobacco: Never Used  . Alcohol Use: No   OB History    Gravida Para Term Preterm AB TAB SAB Ectopic Multiple Living   6 6 6       6      Review of Systems  Constitutional: Positive for fatigue. Negative for activity change.  Eyes: Negative for discharge.  Respiratory: Positive for shortness of breath. Negative for cough and chest tightness.   Cardiovascular: Positive for chest pain.  Gastrointestinal: Negative for abdominal pain and abdominal distention.  Genitourinary: Negative for dysuria, frequency and difficulty urinating.  Musculoskeletal: Negative for joint swelling.  Skin: Negative for rash.  Allergic/Immunologic: Negative for immunocompromised state.  Neurological: Positive for light-headedness.  Psychiatric/Behavioral: Negative for behavioral problems and agitation.      Allergies  Gadolinium derivatives  Home Medications   Prior to Admission medications   Medication Sig Start Date End Date Taking? Authorizing Provider  albuterol (PROVENTIL HFA;VENTOLIN HFA) 108 (90 BASE) MCG/ACT inhaler Inhale 1-2 puffs into the lungs every 6 (six) hours as needed for wheezing. 01/30/15  Yes Tanna Furry, MD  atorvastatin (LIPITOR) 10 MG tablet Take 1 tablet (10 mg total) by mouth daily. 03/30/15  Yes [name], MD  carvedilol (COREG) 6.25 MG tablet Take 1 tablet (6.25 mg total) by mouth 2 (two) times daily with a meal. Patient taking differently: Take 6.25 mg by mouth daily.  03/30/15  Yes [name], MD  gabapentin (NEURONTIN) 100 MG capsule Take 1 capsule (100 mg total) by mouth  3 (three) times daily. Patient taking differently: Take 100 mg by mouth 2 (two) times daily.  04/21/15  Yes [name], MD  ibuprofen (ADVIL,MOTRIN) 200 MG tablet Take 400-800 mg by mouth every 4 (four) hours as needed for moderate pain.    Yes Historical Provider, MD  losartan (COZAAR) 50 MG tablet Take 1 tablet (50 mg total) by mouth daily. 03/30/15  Yes Deepak Advani, MD  metFORMIN (GLUCOPHAGE) 1000 MG tablet TAKE 1 TABLET BY MOUTH 2 TIMES DAILY WITH A MEAL.  07/30/15  Yes Boykin Nearing, MD  PRESCRIPTION MEDICATION Take 0.5 tablets by mouth daily. Patient takes a sleep medication her daughter gives her however patient doesn't know what it is called.   Yes Historical Provider, MD   There were no vitals taken for this visit. Physical Exam  Constitutional: She is oriented to person, place, and time. She appears well-developed and well-nourished.  HENT:  Head: Normocephalic and atraumatic.  Eyes: Conjunctivae are normal. Right eye exhibits no discharge.  Neck: Neck supple.  Cardiovascular: Normal rate, regular rhythm and normal heart sounds.   No murmur heard. Pulmonary/Chest: Effort normal and breath sounds normal. She has no wheezes. She has no rales. She exhibits tenderness.  Abdominal: Soft. She exhibits no distension. There is no tenderness.  Musculoskeletal: Normal range of motion. She exhibits no edema.  Neurological: She is oriented to person, place, and time. No cranial nerve deficit.  Skin: Skin is warm and dry. No rash noted. She is not diaphoretic.  Psychiatric: She has a normal mood and affect. Her behavior is normal.  Nursing note and vitals reviewed.   ED Course  Procedures (including critical care time) Labs Review Labs Reviewed  BASIC METABOLIC PANEL  CBC    Imaging Review No results found. I have personally reviewed and evaluated these images and lab results as part of my medical decision-making.   EKG Interpretation   Date/Time:  Wednesday September 02 2015 16:51:09 EDT Ventricular Rate:  68 PR Interval:  158 QRS Duration: 100 QT Interval:  398 QTC Calculation: 423 R Axis:   -10 Text Interpretation:  Sinus rhythm no acute ischemia No significant change  since last tracing Confirmed by Gerald Leitz ([id number]) on 09/02/2015  5:12:20 PM      MDM   Final diagnoses:  None   patient is a very pleasant 59 year old female with history of CAD, stroke, hypertension presents with 3 weeks of atypical chest pain.  Patient's chest pain is not made worse by exertion. It sounds that it comes and goes at rest and when she gets stressed. It does not sound like typical chest pain to me. We will get a delta troponin given her risk factors.   Do not susupect PE given no tachypnea or hypoxia or SOB.  Her initial EKG is nonischemic.  We will have her follow up with cardiology.  Jamerion Cabello Julio Alm, MD 09/02/15 1759

## 2015-09-04 NOTE — Telephone Encounter (Signed)
Patient is in waiting room.

## 2015-09-04 NOTE — Telephone Encounter (Signed)
Patient recently came in to see Brandi Bryant and her medication for losartan was never refilled. Please follow up with pt. Thank you.

## 2015-09-16 ENCOUNTER — Ambulatory Visit: Payer: Self-pay | Attending: Internal Medicine | Admitting: Internal Medicine

## 2015-09-16 ENCOUNTER — Encounter: Payer: Self-pay | Admitting: Internal Medicine

## 2015-09-16 VITALS — BP 170/73 | HR 62 | Temp 98.0°F | Resp 16 | Ht <= 58 in | Wt 145.0 lb

## 2015-09-16 DIAGNOSIS — E119 Type 2 diabetes mellitus without complications: Secondary | ICD-10-CM

## 2015-09-16 DIAGNOSIS — R079 Chest pain, unspecified: Secondary | ICD-10-CM

## 2015-09-16 DIAGNOSIS — I10 Essential (primary) hypertension: Secondary | ICD-10-CM

## 2015-09-16 LAB — GLUCOSE, POCT (MANUAL RESULT ENTRY): POC Glucose: 138 mg/dl — AB (ref 70–99)

## 2015-09-16 MED ORDER — LOSARTAN POTASSIUM 100 MG PO TABS: 100.0000 mg | ORAL_TABLET | Freq: Every day | ORAL | Status: DC

## 2015-09-16 MED ORDER — METFORMIN HCL 1000 MG PO TABS: ORAL_TABLET | ORAL | Status: DC

## 2015-09-16 NOTE — Progress Notes (Signed)
HFU Chest pain  No paint today  No Hx tobacco

## 2015-09-16 NOTE — Progress Notes (Signed)
Patient ID: Brandi Bryant, female   DOB: [birth year], 59 y.o.   MRN: [id number]  CC:HFU-chest pain   HPI: Brandi Bryant is a 59 y.o. female here today for a follow up visit.  Patient has past medical history of CAD, diabetes, stroke, breast cancer, hypertension presenting today with chest pain for the last several weeks. She reports that any exertion causes chest pain. It is usually relieved at rest but still is noticeable pain. Chest pain is still described as pressure. The chest pain is across her whole anterior chest wall. No diaphoresis associated with the pain. Does not radiate to upper jaw or down her arm.   She reports that she wakes up and eats a light breakfast daily and then has dizziness by the time she gets to work. She states that the dizziness last all day until she gets off work. She then states that she is unable to check her sugars until she gets off in the evening because her job does not allow her to bring anything in the building. She states that she also has nervousness. Evening cbg readings are usually around 140.    Allergies  Allergen Reactions  . Gadolinium Derivatives     Code: VOM, Desc: Pt began vomiting 45 sec after MRI contrast injection of Multihance, Onset Date: [id number]     Past Medical History  Diagnosis Date  . Breast cancer ([place])   . HTN (hypertension)   . Diabetes mellitus   . Cholelithiasis   . Hepatic steatosis   . Abdominal pain   . History of breast cancer 06/01/2012  . Arthralgia 08/13/2013  . Chest pain 09/30/2013  . Stroke [facility]) 2006  . Bell's palsy   . Coronary artery disease    Current Outpatient Prescriptions on File Prior to Visit  Medication Sig Dispense Refill  . albuterol (PROVENTIL HFA;VENTOLIN HFA) 108 (90 BASE) MCG/ACT inhaler Inhale 1-2 puffs into the lungs every 6 (six) hours as needed for wheezing. 1 Inhaler 0  . atorvastatin (LIPITOR) 10 MG tablet Take 1 tablet (10 mg total) by mouth daily. 90 tablet 10  .  carvedilol (COREG) 6.25 MG tablet Take 1 tablet (6.25 mg total) by mouth 2 (two) times daily with a meal. (Patient taking differently: Take 6.25 mg by mouth daily. ) 180 tablet 10  . gabapentin (NEURONTIN) 100 MG capsule Take 1 capsule (100 mg total) by mouth 3 (three) times daily. (Patient taking differently: Take 100 mg by mouth 2 (two) times daily. ) 90 capsule 3  . ibuprofen (ADVIL,MOTRIN) 200 MG tablet Take 400-800 mg by mouth every 4 (four) hours as needed for moderate pain.     Marland Kitchen losartan (COZAAR) 50 MG tablet TAKE 1 TABLET BY MOUTH DAILY. 30 tablet 3  . metFORMIN (GLUCOPHAGE) 1000 MG tablet TAKE 1 TABLET BY MOUTH 2 TIMES DAILY WITH A MEAL. 60 tablet 0  . PRESCRIPTION MEDICATION Take 0.5 tablets by mouth daily. Patient takes a sleep medication her daughter gives her however patient doesn't know what it is called.    . [DISCONTINUED] escitalopram (LEXAPRO) 10 MG tablet Take 10 mg by mouth daily.     Current Facility-Administered Medications on File Prior to Visit  Medication Dose Route Frequency Provider Last Rate Last Dose  . aspirin tablet 325 mg  325 mg Oral Daily Lance Bosch, NP   325 mg at 09/02/15 1628   Family History  Problem Relation Age of Onset  . Thyroid cancer Sister   . Hypertension Mother   .  Diabetes Mother   . Migraines Daughter    Social History   Social History  . Marital Status: Single    Spouse Name: N/A  . Number of Children: N/A  . Years of Education: N/A   Occupational History  . Not on file.   Social History Main Topics  . Smoking status: Never Smoker   . Smokeless tobacco: Never Used  . Alcohol Use: No  . Drug Use: No  . Sexual Activity: Not Currently    Birth Control/ Protection: Surgical   Other Topics Concern  . Not on file   Social History Narrative    Review of Systems: Other than what is stated in HPI, all other systems are negative.   Objective:   Filed Vitals:   09/16/15 1220  BP: 170/73  Pulse: 62  Temp: 98 F (36.7 C)   Resp: 16    Physical Exam  Constitutional: She is oriented to person, place, and time.  Neck: No thyromegaly present.  Cardiovascular: Normal rate, regular rhythm and normal heart sounds.   Pulmonary/Chest: Effort normal and breath sounds normal. She exhibits no tenderness.  Neurological: She is alert and oriented to person, place, and time.  Skin: Skin is warm and dry.  Psychiatric: She has a normal mood and affect.     Lab Results  Component Value Date   WBC 6.3 09/02/2015   HGB 13.3 09/02/2015   HCT 38.3 09/02/2015   MCV 86.1 09/02/2015   PLT 150 09/02/2015   Lab Results  Component Value Date   CREATININE 0.69 09/02/2015   BUN 12 09/02/2015   NA 136 09/02/2015   K 3.4* 09/02/2015   CL 100* 09/02/2015   CO2 25 09/02/2015    Lab Results  Component Value Date   HGBA1C 6.90 09/02/2015   Lipid Panel     Component Value Date/Time   CHOL 155 04/18/2014 0855   TRIG 129 04/18/2014 0855   HDL 47 04/18/2014 0855   CHOLHDL 3.3 04/18/2014 0855   VLDL 26 04/18/2014 0855   LDLCALC 82 04/18/2014 0855       Assessment and plan:   Brandi Bryant was seen today for hospitalization follow-up.  Diagnoses and all orders for this visit:  Chest pain, unspecified chest pain type I will place patient on Dr. Winnifred Friar schedule for further evaluation. She has several risk factors including diabetes, HTN, CAD, and past CVA.   Type 2 diabetes mellitus without complication, without long-term current use of insulin (HCC) -     metFORMIN (GLUCOPHAGE) 1000 MG tablet; TAKE 1 TABLET BY MOUTH 2 TIMES DAILY WITH A MEAL. Patients diabetes is well control as evidence by consistently low a1c.  Patient will continue with current therapy and continue to make necessary lifestyle changes.  Reviewed foot care, diet, exercise, annual health maintenance with patient.    Essential hypertension -     losartan (COZAAR) 100 MG tablet; Take 1 tablet (100 mg total) by mouth daily. Blood pressure not controlled,  will increase Losartan to 100 mg daily. BP will be rechecked in 1 week when she has initial visit with Dr. Verl Bryant.  Due to language barrier, an interpreter was present during the history-taking and subsequent discussion (and for part of the physical exam) with this patient.   Return in about 1 week (around 09/23/2015) for Dr. Andi Bryant pain, and 3 mo PCP DM/HTN.        Lance Bosch, [facility] [phone] 09/16/2015, 12:48 PM

## 2015-09-23 ENCOUNTER — Ambulatory Visit: Payer: Self-pay | Admitting: Cardiology

## 2015-09-28 ENCOUNTER — Other Ambulatory Visit: Payer: Self-pay | Admitting: Internal Medicine

## 2015-09-28 NOTE — Telephone Encounter (Signed)
Patient ran out of her gabapentin (NEURONTIN) 100 MG capsule and is hoping to get that refilled. She last saw valerie keck on 10/26. Please follow up with pt. Thank you.

## 2015-09-29 ENCOUNTER — Telehealth: Payer: Self-pay | Admitting: Hematology and Oncology

## 2015-09-29 NOTE — Telephone Encounter (Signed)
Almyra Free s.w. Pt and r/s 11.29 appt to 12.6 due to me out of the office...the patient ok and aware

## 2015-09-30 ENCOUNTER — Ambulatory Visit: Payer: Self-pay | Admitting: Cardiology

## 2015-10-07 ENCOUNTER — Encounter: Payer: Self-pay | Admitting: Cardiology

## 2015-10-07 ENCOUNTER — Ambulatory Visit: Payer: Self-pay | Attending: Cardiology | Admitting: Cardiology

## 2015-10-07 VITALS — BP 157/82 | HR 68 | Temp 98.6°F | Resp 20 | Ht 60.0 in | Wt 146.2 lb

## 2015-10-07 DIAGNOSIS — Z833 Family history of diabetes mellitus: Secondary | ICD-10-CM | POA: Insufficient documentation

## 2015-10-07 DIAGNOSIS — E119 Type 2 diabetes mellitus without complications: Secondary | ICD-10-CM | POA: Insufficient documentation

## 2015-10-07 DIAGNOSIS — Z8249 Family history of ischemic heart disease and other diseases of the circulatory system: Secondary | ICD-10-CM | POA: Insufficient documentation

## 2015-10-07 DIAGNOSIS — Z853 Personal history of malignant neoplasm of breast: Secondary | ICD-10-CM | POA: Insufficient documentation

## 2015-10-07 DIAGNOSIS — I10 Essential (primary) hypertension: Secondary | ICD-10-CM

## 2015-10-07 DIAGNOSIS — R0789 Other chest pain: Secondary | ICD-10-CM | POA: Insufficient documentation

## 2015-10-07 DIAGNOSIS — Z8673 Personal history of transient ischemic attack (TIA), and cerebral infarction without residual deficits: Secondary | ICD-10-CM | POA: Insufficient documentation

## 2015-10-07 DIAGNOSIS — Z7984 Long term (current) use of oral hypoglycemic drugs: Secondary | ICD-10-CM | POA: Insufficient documentation

## 2015-10-07 DIAGNOSIS — E785 Hyperlipidemia, unspecified: Secondary | ICD-10-CM | POA: Insufficient documentation

## 2015-10-07 DIAGNOSIS — Z79899 Other long term (current) drug therapy: Secondary | ICD-10-CM | POA: Insufficient documentation

## 2015-10-07 DIAGNOSIS — Z6828 Body mass index (BMI) 28.0-28.9, adult: Secondary | ICD-10-CM | POA: Insufficient documentation

## 2015-10-07 DIAGNOSIS — R079 Chest pain, unspecified: Secondary | ICD-10-CM

## 2015-10-07 DIAGNOSIS — I25119 Atherosclerotic heart disease of native coronary artery with unspecified angina pectoris: Secondary | ICD-10-CM

## 2015-10-07 DIAGNOSIS — I2510 Atherosclerotic heart disease of native coronary artery without angina pectoris: Secondary | ICD-10-CM | POA: Insufficient documentation

## 2015-10-07 DIAGNOSIS — E663 Overweight: Secondary | ICD-10-CM | POA: Insufficient documentation

## 2015-10-07 MED ORDER — PANTOPRAZOLE SODIUM 40 MG PO TBEC: 40.0000 mg | DELAYED_RELEASE_TABLET | Freq: Every day | ORAL | Status: DC

## 2015-10-07 MED ORDER — CARVEDILOL 12.5 MG PO TABS: 12.5000 mg | ORAL_TABLET | Freq: Two times a day (BID) | ORAL | Status: DC

## 2015-10-07 NOTE — Assessment & Plan Note (Signed)
This sounds mostly like esophageal reflux or esophagitis. She is tender on exam consistent with gastritis. She is taking when necessary Tums. I have placed her on Protonix daily for 4 weeks then when necessary as needed. She knows what foods trigger it and will try to avoid this. No further cardiac evaluation. She will continue secondary preventative strategies including type blood pressure and sugar control, statin, weight control, and enteric-coated 81 mg aspirin daily.

## 2015-10-07 NOTE — Progress Notes (Signed)
HPI [name] comes to the office today referred by Michael Boston for the evaluation of chest discomfort. She has a history of atypical chest pain with a cardiac catheterization done in 2014. This showed mild nonobstructive diagonal branch disease. She had normal left ventricular function.  She visited emergency room in mid October for chest discomfort. Her cardiac markers were negative and her EKG was normal. Chest x-ray also was normal.  She describes a chest tightness that comes on sometimes with activity and is relieved with lying down. There no other associated symptoms. Sometimes this can last all day and is not provoked by activity. Most the time when she is walking without any sort of stress she does not have this discomfort.  She does have a history of hyperlipidemia and type 2 diabetes mellitus. She is on appropriate therapy for that with an LDL that is at goal. She also has hypertension. She does not smoke. She is mildly overweight.  She does have some symptoms of acid reflux and takes when necessary Tums. She denies any melena or blood in her stool.  Past Medical History  Diagnosis Date  . Breast cancer ([place])   . HTN (hypertension)   . Diabetes mellitus   . Cholelithiasis   . Hepatic steatosis   . Abdominal pain   . History of breast cancer 06/01/2012  . Arthralgia 08/13/2013  . Chest pain 09/30/2013  . Stroke [facility]) 2006  . Bell's palsy   . Coronary artery disease     Current Outpatient Prescriptions  Medication Sig Dispense Refill  . albuterol (PROVENTIL HFA;VENTOLIN HFA) 108 (90 BASE) MCG/ACT inhaler Inhale 1-2 puffs into the lungs every 6 (six) hours as needed for wheezing. 1 Inhaler 0  . atorvastatin (LIPITOR) 10 MG tablet Take 1 tablet (10 mg total) by mouth daily. 90 tablet 10  . carvedilol (COREG) 6.25 MG tablet Take 1 tablet (6.25 mg total) by mouth 2 (two) times daily with a meal. (Patient taking differently: Take 6.25 mg by mouth daily. ) 180 tablet 10  . gabapentin  (NEURONTIN) 100 MG capsule TAKE 1 CAPSULE BY MOUTH 3 TIMES DAILY 90 capsule 3  . ibuprofen (ADVIL,MOTRIN) 200 MG tablet Take 400-800 mg by mouth every 4 (four) hours as needed for moderate pain.     Marland Kitchen losartan (COZAAR) 100 MG tablet Take 1 tablet (100 mg total) by mouth daily. 30 tablet 4  . metFORMIN (GLUCOPHAGE) 1000 MG tablet TAKE 1 TABLET BY MOUTH 2 TIMES DAILY WITH A MEAL. 60 tablet 4  . PRESCRIPTION MEDICATION Take 0.5 tablets by mouth daily. Patient takes a sleep medication her daughter gives her however patient doesn't know what it is called.    . [DISCONTINUED] escitalopram (LEXAPRO) 10 MG tablet Take 10 mg by mouth daily.     Current Facility-Administered Medications  Medication Dose Route Frequency Provider Last Rate Last Dose  . aspirin tablet 325 mg  325 mg Oral Daily Lance Bosch, NP   325 mg at 09/02/15 1628    Allergies  Allergen Reactions  . Gadolinium Derivatives     Code: VOM, Desc: Pt began vomiting 45 sec after MRI contrast injection of Multihance, Onset Date: [id number]      Family History  Problem Relation Age of Onset  . Thyroid cancer Sister   . Hypertension Mother   . Diabetes Mother   . Migraines Daughter     Social History   Social History  . Marital Status: Single    Spouse Name: N/A  .  Number of Children: N/A  . Years of Education: N/A   Occupational History  . Not on file.   Social History Main Topics  . Smoking status: Never Smoker   . Smokeless tobacco: Never Used  . Alcohol Use: No  . Drug Use: No  . Sexual Activity: Not Currently    Birth Control/ Protection: Surgical   Other Topics Concern  . Not on file   Social History Narrative    ROS ALL NEGATIVE EXCEPT THOSE NOTED IN HPI  PE  General Appearance: well developed, well nourished in no acute distress, moderately obese HEENT: symmetrical face, PERRLA, good dentition  Neck: no JVD, thyromegaly, or adenopathy, trachea midline Chest: symmetric without deformity Cardiac: PMI  non-displaced, RRR, normal S1, S2, no gallop or murmur Lung: clear to ausculation and percussion Vascular: all pulses full without bruits  Abdominal: nondistended, mild epigastric tenderness, good bowel sounds, no HSM, no bruits Extremities: no cyanosis, clubbing or edema, no sign of DVT, no varicosities  Skin: normal color, no rashes Neuro: alert and oriented x 3, non-focal Pysch: normal affect  EKG  BMET    Component Value Date/Time   NA 136 09/02/2015 1719   NA 139 09/30/2013 1033   K 3.4* 09/02/2015 1719   K 4.2 09/30/2013 1033   CL 100* 09/02/2015 1719   CL 107 01/28/2013 0854   CO2 25 09/02/2015 1719   CO2 22 09/30/2013 1033   GLUCOSE 181* 09/02/2015 1719   GLUCOSE 202* 04/18/2014 0853   GLUCOSE 243* 01/28/2013 0854   BUN 12 09/02/2015 1719   BUN 13.0 09/30/2013 1033   CREATININE 0.69 09/02/2015 1719   CREATININE 0.62 04/21/2015 1200   CREATININE 0.8 09/30/2013 1033   CALCIUM 9.2 09/02/2015 1719   CALCIUM 9.9 09/30/2013 1033   GFRNONAA >60 09/02/2015 1719   GFRNONAA >89 04/21/2015 1200   GFRAA >60 09/02/2015 1719   GFRAA >89 04/21/2015 1200    Lipid Panel     Component Value Date/Time   CHOL 155 04/18/2014 0855   TRIG 129 04/18/2014 0855   HDL 47 04/18/2014 0855   CHOLHDL 3.3 04/18/2014 0855   VLDL 26 04/18/2014 0855   LDLCALC 82 04/18/2014 0855    CBC    Component Value Date/Time   WBC 6.3 09/02/2015 1719   WBC 5.8 09/30/2013 1033   RBC 4.45 09/02/2015 1719   RBC 4.42 09/30/2013 1033   HGB 13.3 09/02/2015 1719   HGB 13.4 09/30/2013 1033   HCT 38.3 09/02/2015 1719   HCT 39.1 09/30/2013 1033   PLT 150 09/02/2015 1719   PLT 150 09/30/2013 1033   MCV 86.1 09/02/2015 1719   MCV 88.5 09/30/2013 1033   MCH 29.9 09/02/2015 1719   MCH 30.3 09/30/2013 1033   MCHC 34.7 09/02/2015 1719   MCHC 34.2 09/30/2013 1033   RDW 12.3 09/02/2015 1719   RDW 12.8 09/30/2013 1033   LYMPHSABS 2.0 07/13/2015 2232   LYMPHSABS 2.1 09/30/2013 1033   MONOABS 0.5  07/13/2015 2232   MONOABS 0.4 09/30/2013 1033   EOSABS 0.3 07/13/2015 2232   EOSABS 0.2 09/30/2013 1033   BASOSABS 0.0 07/13/2015 2232   BASOSABS 0.0 09/30/2013 1033

## 2015-10-07 NOTE — Patient Instructions (Addendum)
Thank you for visiting with Dr. Verl Blalock today. Carvedilol has been increased to 12.5mg  daily. Begin Protonix once daily for 4 weeks. Continue Protonix AS NEEDED after 4 weeks.

## 2015-10-07 NOTE — Progress Notes (Signed)
Patient denies chest pain at this time. Patient complains of SOB and fatigue Patient states SOB is present for 10-20 minutes which decreases with laying down.

## 2015-10-07 NOTE — Assessment & Plan Note (Signed)
This discomfort does not sound like angina. Continue secondary preventative strategies including those mentioned under chest discomfort. I will see her back in a year.

## 2015-10-12 ENCOUNTER — Ambulatory Visit: Payer: Self-pay | Attending: Family Medicine

## 2015-10-20 ENCOUNTER — Ambulatory Visit: Payer: Self-pay | Admitting: Hematology and Oncology

## 2015-10-26 ENCOUNTER — Telehealth: Payer: Self-pay | Admitting: Hematology and Oncology

## 2015-10-26 NOTE — Telephone Encounter (Signed)
lvm for Brandi Bryant to call pt to see if she can come in earlier.

## 2015-10-27 ENCOUNTER — Telehealth: Payer: Self-pay | Admitting: Hematology and Oncology

## 2015-10-27 ENCOUNTER — Ambulatory Visit: Payer: Self-pay | Admitting: Hematology and Oncology

## 2015-10-27 ENCOUNTER — Ambulatory Visit (HOSPITAL_BASED_OUTPATIENT_CLINIC_OR_DEPARTMENT_OTHER): Payer: No Typology Code available for payment source | Admitting: Hematology and Oncology

## 2015-10-27 ENCOUNTER — Encounter: Payer: Self-pay | Admitting: Hematology and Oncology

## 2015-10-27 VITALS — BP 147/63 | HR 63 | Temp 97.8°F | Resp 20 | Ht 60.0 in | Wt 145.1 lb

## 2015-10-27 DIAGNOSIS — Z1211 Encounter for screening for malignant neoplasm of colon: Secondary | ICD-10-CM

## 2015-10-27 DIAGNOSIS — E1140 Type 2 diabetes mellitus with diabetic neuropathy, unspecified: Secondary | ICD-10-CM

## 2015-10-27 DIAGNOSIS — Z853 Personal history of malignant neoplasm of breast: Secondary | ICD-10-CM

## 2015-10-27 DIAGNOSIS — I25118 Atherosclerotic heart disease of native coronary artery with other forms of angina pectoris: Secondary | ICD-10-CM

## 2015-10-27 HISTORY — DX: Encounter for screening for malignant neoplasm of colon: Z12.11

## 2015-10-27 NOTE — Assessment & Plan Note (Signed)
We did discuss about preventive care. She is due for colonoscopy screening. I will refer her to GI for colonoscopy screening and to address whether she had noncardiac chest pain such as esophagitis as the cause of her chest pain

## 2015-10-27 NOTE — Progress Notes (Signed)
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 She had neoadjuvant chemotherapy with Cytoxan and Taxotere is status post lumpectomy with sentinel lymph node on 04/21/2009. She received adjuvant radiation therapy. She was on adjuvant hormonal therapy with tamoxifen started in August 2010. In March 2014: her treatment is switched to Femara. Femara is discontinued on 08/21/2014.  INTERVAL HISTORY: History is obtained via interpreter Please see below for problem oriented charting. She is seen here because of history of breast cancer. She missed doing mammogram last year. She denies any recent abnormal breast examination, palpable mass, abnormal breast appearance or nipple changes  The patient continued to have chronic, intermittent chest pain. Recently, she was prescribed protonic spread developed significant diarrhea. The chest pain is located in the retrosternal area, without any major triggering factors. She has not seen cardiologist for over a year. The patient have diabetes with peripheral neuropathy. Her fasting morning sugar usually runs in the 140 range. She denies sensation of reflux or heartburn  REVIEW OF SYSTEMS:   Constitutional: Denies fevers, chills or abnormal weight loss Eyes: Denies blurriness of vision Ears, nose, mouth, throat, and face: Denies mucositis or sore throat Respiratory: Denies cough, dyspnea or wheezes Gastrointestinal:  Denies nausea, heartburn or change in bowel habits Skin: Denies abnormal skin rashes Lymphatics: Denies new lymphadenopathy or easy bruising Neurological:Denies numbness, tingling or new weaknesses Behavioral/Psych:  Mood is stable, no new changes  All other systems were reviewed with the patient and are negative.  I have reviewed the past medical history, past surgical history, social history and family history with the patient and they are unchanged from previous note.  ALLERGIES:  is allergic to gadolinium derivatives.  MEDICATIONS:  Current Outpatient Prescriptions  Medication Sig Dispense Refill  . albuterol (PROVENTIL HFA;VENTOLIN HFA) 108 (90 BASE) MCG/ACT inhaler Inhale 1-2 puffs into the lungs every 6 (six) hours as needed for wheezing. 1 Inhaler 0  . atorvastatin (LIPITOR) 10 MG tablet Take 1 tablet (10 mg total) by mouth daily. 90 tablet 10  . calcium carbonate (TUMS - DOSED IN MG ELEMENTAL CALCIUM) 500 MG chewable tablet Chew 1 tablet by mouth daily.    . carvedilol (COREG) 12.5 MG tablet Take 1 tablet (12.5 mg total) by mouth 2 (two) times daily with a meal. 60 tablet 6  . gabapentin (NEURONTIN) 100 MG capsule TAKE 1 CAPSULE BY MOUTH 3 TIMES DAILY 90 capsule 3  . ibuprofen (ADVIL,MOTRIN) 200 MG tablet Take 400-800 mg by mouth every 4 (four) hours as needed for moderate pain.     Marland Kitchen losartan (COZAAR) 100 MG tablet Take 1 tablet (100 mg total) by mouth daily. 30 tablet 4  . metFORMIN (GLUCOPHAGE) 1000 MG tablet TAKE 1 TABLET BY MOUTH 2 TIMES DAILY WITH A MEAL. 60 tablet 4  . pantoprazole (PROTONIX) 40 MG tablet Take 1 tablet (40 mg total) by mouth daily. 30 tablet 3  . PRESCRIPTION MEDICATION Take 0.5 tablets by mouth daily. Patient takes a sleep medication her daughter gives her however patient doesn't know what it is called.    . [DISCONTINUED] escitalopram (LEXAPRO) 10 MG tablet Take 10 mg by mouth daily.     Current Facility-Administered Medications  Medication  Dose Route Frequency Provider Last Rate Last Dose  . aspirin tablet 325 mg  325 mg Oral Daily Lance Bosch, NP   325 mg at 09/02/15 1628    PHYSICAL EXAMINATION: ECOG PERFORMANCE STATUS: 1 - Symptomatic but completely  ambulatory  Filed Vitals:   10/27/15 1243  BP: 147/63  Pulse: 63  Temp: 97.8 F (36.6 C)  Resp: 20   Filed Weights   10/27/15 1243  Weight: 145 lb 1.6 oz (65.817 kg)    GENERAL:alert, no distress and comfortable SKIN: skin color, texture, turgor are normal, no rashes or significant lesions EYES: normal, Conjunctiva are pink and non-injected, sclera clear OROPHARYNX:no exudate, no erythema and lips, buccal mucosa, and tongue normal  NECK: supple, thyroid normal size, non-tender, without nodularity LYMPH:  no palpable lymphadenopathy in the cervical, axillary or inguinal LUNGS: clear to auscultation and percussion with normal breathing effort HEART: regular rate & rhythm and no murmurs and no lower extremity edema ABDOMEN:abdomen soft, non-tender and normal bowel sounds Musculoskeletal:no cyanosis of digits and no clubbing  NEURO: alert & oriented x 3 with fluent speech, no focal motor/sensory deficits Bilateral breast examination were performed. Well-healed lumpectomy scar in the left breast without any abnormalities  LABORATORY DATA:  I have reviewed the data as listed    Component Value Date/Time   NA 136 09/02/2015 1719   NA 139 09/30/2013 1033   K 3.4* 09/02/2015 1719   K 4.2 09/30/2013 1033   CL 100* 09/02/2015 1719   CL 107 01/28/2013 0854   CO2 25 09/02/2015 1719   CO2 22 09/30/2013 1033   GLUCOSE 181* 09/02/2015 1719   GLUCOSE 202* 04/18/2014 0853   GLUCOSE 243* 01/28/2013 0854   BUN 12 09/02/2015 1719   BUN 13.0 09/30/2013 1033   CREATININE 0.69 09/02/2015 1719   CREATININE 0.62 04/21/2015 1200   CREATININE 0.8 09/30/2013 1033   CALCIUM 9.2 09/02/2015 1719   CALCIUM 9.9 09/30/2013 1033   PROT 6.7 07/13/2015 2232   PROT 7.5 09/30/2013 1033   ALBUMIN 3.6 07/13/2015 2232   ALBUMIN 3.7 09/30/2013 1033   AST 50* 07/13/2015 2232   AST 50* 09/30/2013 1033   ALT 29 07/13/2015 2232   ALT 45 09/30/2013 1033   ALKPHOS 110 07/13/2015 2232   ALKPHOS 108  09/30/2013 1033   BILITOT 0.7 07/13/2015 2232   BILITOT 0.89 09/30/2013 1033   GFRNONAA >60 09/02/2015 1719   GFRNONAA >89 04/21/2015 1200   GFRAA >60 09/02/2015 1719   GFRAA >89 04/21/2015 1200    No results found for: SPEP, UPEP  Lab Results  Component Value Date   WBC 6.3 09/02/2015   NEUTROABS 2.7 07/13/2015   HGB 13.3 09/02/2015   HCT 38.3 09/02/2015   MCV 86.1 09/02/2015   PLT 150 09/02/2015      Chemistry      Component Value Date/Time   NA 136 09/02/2015 1719   NA 139 09/30/2013 1033   K 3.4* 09/02/2015 1719   K 4.2 09/30/2013 1033   CL 100* 09/02/2015 1719   CL 107 01/28/2013 0854   CO2 25 09/02/2015 1719   CO2 22 09/30/2013 1033   BUN 12 09/02/2015 1719   BUN 13.0 09/30/2013 1033   CREATININE 0.69 09/02/2015 1719   CREATININE 0.62 04/21/2015 1200   CREATININE 0.8 09/30/2013 1033   GLU 569* 01/26/2009 1508      Component Value Date/Time   CALCIUM 9.2 09/02/2015 1719   CALCIUM 9.9 09/30/2013 1033   ALKPHOS 110 07/13/2015  2232   ALKPHOS 108 09/30/2013 1033   AST 50* 07/13/2015 2232   AST 50* 09/30/2013 1033   ALT 29 07/13/2015 2232   ALT 45 09/30/2013 1033   BILITOT 0.7 07/13/2015 2232   BILITOT 0.89 09/30/2013 1033      ASSESSMENT & PLAN:  History of breast cancer She has completed 5 years of adjuvant hormonal therapy. I recommend yearly visit with history, physical examination and mammogram. She missed her mammogram recently and I have ordered it. I will transition her care to cancer survivorship program next year.   Colon cancer screening We did discuss about preventive care. She is due for colonoscopy screening. I will refer her to GI for colonoscopy screening and to address whether she had noncardiac chest pain such as esophagitis as the cause of her chest pain  CAD (coronary artery disease), native coronary artery She has multiple cardiac risk factors including poorly controlled diabetes. She was referred to see cardiologist a year ago  and angiogram at the time showed moderate nonobstructive coronary vessels. I recommend referral back to cardiologist for further workup in view of recent ongoing recurrent chest pain. As above, I would also recommend GI evaluation to exclude esophagitis.  Type 2 diabetes mellitus with diabetic neuropathy [facility]) she will continue current medical management. I recommend close follow-up with primary care doctor for medication adjustment.    Orders Placed This Encounter  Procedures  . MM Digital Diagnostic Bilat    Standing Status: Future     Number of Occurrences:      Standing Expiration Date: 11/30/2016    Order Specific Question:  Reason for exam:    Answer:  left breast ca, s/p lumpectomy & XRT, exlcude recurrence    Order Specific Question:  Preferred imaging location?    Answer:  [facility]  . Amb Referral to Survivorship Long term    Referral Priority:  Routine    Referral Type:  Consultation    Number of Visits Requested:  1  . Ambulatory referral to Gastroenterology    Referral Priority:  Routine    Referral Type:  Consultation    Referral Reason:  Specialty Services Required    Number of Visits Requested:  1   All questions were answered. The patient knows to call the clinic with any problems, questions or concerns. No barriers to learning was detected. I spent 25 minutes counseling the patient face to face. The total time spent in the appointment was 30 minutes and more than 50% was on counseling and review of test results     [facility], Jazmaine Fuelling, MD 10/27/2015 1:46 PM

## 2015-10-27 NOTE — Assessment & Plan Note (Signed)
she will continue current medical management. I recommend close follow-up with primary care doctor for medication adjustment.  

## 2015-10-27 NOTE — Assessment & Plan Note (Signed)
She has completed 5 years of adjuvant hormonal therapy. I recommend yearly visit with history, physical examination and mammogram. She missed her mammogram recently and I have ordered it. I will transition her care to cancer survivorship program next year.

## 2015-10-27 NOTE — Assessment & Plan Note (Signed)
She has multiple cardiac risk factors including poorly controlled diabetes. She was referred to see cardiologist a year ago and angiogram at the time showed moderate nonobstructive coronary vessels. I recommend referral back to cardiologist for further workup in view of recent ongoing recurrent chest pain. As above, I would also recommend GI evaluation to exclude esophagitis.

## 2015-10-27 NOTE — Telephone Encounter (Signed)
Almyra Free confirmed that pt could come in today at 12:45..the patient ok and aware

## 2015-10-28 ENCOUNTER — Telehealth: Payer: Self-pay | Admitting: Hematology and Oncology

## 2015-10-28 NOTE — Telephone Encounter (Signed)
lvm for Almyra Free to call and make pt aware of mammo at [facility]

## 2015-10-29 ENCOUNTER — Telehealth: Payer: Self-pay | Admitting: *Deleted

## 2015-10-29 NOTE — Telephone Encounter (Signed)
-----   Message from Heath Lark, MD sent at 10/29/2015 11:07 AM EST ----- Regarding: referral to GI & CV She needs to see both GI for colonoscopy and back to CV to see Dr. Meda Coffee for chest pain She speaks Spanish only and fell through the crack

## 2015-10-30 ENCOUNTER — Other Ambulatory Visit: Payer: Self-pay | Admitting: Hematology and Oncology

## 2015-10-30 ENCOUNTER — Other Ambulatory Visit: Payer: Self-pay | Admitting: *Deleted

## 2015-10-30 ENCOUNTER — Encounter: Payer: Self-pay | Admitting: Hematology and Oncology

## 2015-10-30 DIAGNOSIS — Z1211 Encounter for screening for malignant neoplasm of colon: Secondary | ICD-10-CM

## 2015-10-30 DIAGNOSIS — Z299 Encounter for prophylactic measures, unspecified: Secondary | ICD-10-CM

## 2015-10-30 DIAGNOSIS — R079 Chest pain, unspecified: Secondary | ICD-10-CM

## 2015-10-30 DIAGNOSIS — Z853 Personal history of malignant neoplasm of breast: Secondary | ICD-10-CM

## 2015-10-30 HISTORY — DX: Encounter for screening for malignant neoplasm of colon: Z12.11

## 2015-10-30 NOTE — Telephone Encounter (Signed)
Request sent to Scheduler to help get these appts made and alert offices that pt will need Spanish speaking Interpreter for all appointments.

## 2015-11-06 ENCOUNTER — Ambulatory Visit
Admission: RE | Admit: 2015-11-06 | Discharge: 2015-11-06 | Disposition: A | Discharge status: Home / Self Care | Payer: No Typology Code available for payment source | Source: Ambulatory Visit | Attending: Hematology and Oncology | Admitting: Hematology and Oncology

## 2015-11-06 DIAGNOSIS — Z853 Personal history of malignant neoplasm of breast: Secondary | ICD-10-CM

## 2015-11-19 ENCOUNTER — Encounter (HOSPITAL_COMMUNITY): Payer: Self-pay | Admitting: Cardiology

## 2015-11-19 ENCOUNTER — Emergency Department (HOSPITAL_COMMUNITY): Payer: No Typology Code available for payment source

## 2015-11-19 ENCOUNTER — Emergency Department (HOSPITAL_COMMUNITY)
Admission: EM | Admit: 2015-11-19 | Discharge: 2015-11-19 | Disposition: A | Discharge status: Home / Self Care | Payer: No Typology Code available for payment source | Attending: Emergency Medicine | Admitting: Emergency Medicine

## 2015-11-19 DIAGNOSIS — I10 Essential (primary) hypertension: Secondary | ICD-10-CM | POA: Insufficient documentation

## 2015-11-19 DIAGNOSIS — N390 Urinary tract infection, site not specified: Secondary | ICD-10-CM

## 2015-11-19 DIAGNOSIS — K8070 Calculus of gallbladder and bile duct without cholecystitis without obstruction: Secondary | ICD-10-CM | POA: Insufficient documentation

## 2015-11-19 DIAGNOSIS — Z9889 Other specified postprocedural states: Secondary | ICD-10-CM | POA: Insufficient documentation

## 2015-11-19 DIAGNOSIS — R1030 Lower abdominal pain, unspecified: Secondary | ICD-10-CM

## 2015-11-19 DIAGNOSIS — Z853 Personal history of malignant neoplasm of breast: Secondary | ICD-10-CM | POA: Insufficient documentation

## 2015-11-19 DIAGNOSIS — Z8673 Personal history of transient ischemic attack (TIA), and cerebral infarction without residual deficits: Secondary | ICD-10-CM | POA: Insufficient documentation

## 2015-11-19 DIAGNOSIS — I2510 Atherosclerotic heart disease of native coronary artery without angina pectoris: Secondary | ICD-10-CM | POA: Insufficient documentation

## 2015-11-19 DIAGNOSIS — E119 Type 2 diabetes mellitus without complications: Secondary | ICD-10-CM | POA: Insufficient documentation

## 2015-11-19 DIAGNOSIS — Z8669 Personal history of other diseases of the nervous system and sense organs: Secondary | ICD-10-CM | POA: Insufficient documentation

## 2015-11-19 DIAGNOSIS — R112 Nausea with vomiting, unspecified: Secondary | ICD-10-CM

## 2015-11-19 DIAGNOSIS — Z79899 Other long term (current) drug therapy: Secondary | ICD-10-CM | POA: Insufficient documentation

## 2015-11-19 LAB — COMPREHENSIVE METABOLIC PANEL
ALBUMIN: 3.8 g/dL (ref 3.5–5.0)
ALK PHOS: 92 U/L (ref 38–126)
ALT: 23 U/L (ref 14–54)
AST: 34 U/L (ref 15–41)
Anion gap: 12 (ref 5–15)
BILIRUBIN TOTAL: 1.4 mg/dL — AB (ref 0.3–1.2)
BUN: 12 mg/dL (ref 6–20)
CALCIUM: 9.5 mg/dL (ref 8.9–10.3)
CO2: 26 mmol/L (ref 22–32)
CREATININE: 0.7 mg/dL (ref 0.44–1.00)
Chloride: 105 mmol/L (ref 101–111)
GFR calc Af Amer: >60 mL/min (ref >60)
GFR calc non Af Amer: >60 mL/min (ref >60)
GLUCOSE: 174 mg/dL — AB (ref 65–99)
Potassium: 4.1 mmol/L (ref 3.5–5.1)
SODIUM: 143 mmol/L (ref 135–145)
TOTAL PROTEIN: 7.2 g/dL (ref 6.5–8.1)

## 2015-11-19 LAB — LIPASE, BLOOD: Lipase: 27 U/L (ref 11–51)

## 2015-11-19 LAB — URINALYSIS, ROUTINE W REFLEX MICROSCOPIC
BILIRUBIN URINE: NEGATIVE
Glucose, UA: NEGATIVE mg/dL
HGB URINE DIPSTICK: NEGATIVE
KETONES UR: NEGATIVE mg/dL
Leukocytes, UA: NEGATIVE
Nitrite: POSITIVE — AB
Protein, ur: NEGATIVE mg/dL
SPECIFIC GRAVITY, URINE: 1.035 — AB (ref 1.005–1.030)
pH: 6.5 (ref 5.0–8.0)

## 2015-11-19 LAB — CBC
HCT: 42.4 % (ref 36.0–46.0)
Hemoglobin: 14.2 g/dL (ref 12.0–15.0)
MCH: 29.2 pg (ref 26.0–34.0)
MCHC: 33.5 g/dL (ref 30.0–36.0)
MCV: 87.1 fL (ref 78.0–100.0)
PLATELETS: 153 10*3/uL (ref 150–400)
RBC: 4.87 MIL/uL (ref 3.87–5.11)
RDW: 12.9 % (ref 11.5–15.5)
WBC: 9.3 10*3/uL (ref 4.0–10.5)

## 2015-11-19 LAB — URINE MICROSCOPIC-ADD ON: RBC / HPF: NONE SEEN RBC/hpf (ref 0–5)

## 2015-11-19 MED ORDER — ACETAMINOPHEN 325 MG PO TABS
650.0000 mg | ORAL_TABLET | Freq: Once | ORAL | Status: AC
  Administered 2015-11-19: 650 mg via ORAL
  Filled 2015-11-19: qty 2

## 2015-11-19 MED ORDER — SODIUM CHLORIDE 0.9 % IV BOLUS (SEPSIS)
1000.0000 mL | Freq: Once | INTRAVENOUS | Status: AC
  Administered 2015-11-19: 1000 mL via INTRAVENOUS

## 2015-11-19 MED ORDER — IOHEXOL 300 MG/ML  SOLN
100.0000 mL | Freq: Once | INTRAMUSCULAR | Status: AC | PRN
  Administered 2015-11-19: 100 mL via INTRAVENOUS

## 2015-11-19 MED ORDER — ONDANSETRON HCL 4 MG PO TABS: 4.0000 mg | ORAL_TABLET | Freq: Four times a day (QID) | ORAL | Status: DC

## 2015-11-19 MED ORDER — HYDROMORPHONE HCL 1 MG/ML IJ SOLN
0.5000 mg | Freq: Once | INTRAMUSCULAR | Status: AC
  Administered 2015-11-19: 0.5 mg via INTRAVENOUS
  Filled 2015-11-19: qty 1

## 2015-11-19 MED ORDER — MORPHINE SULFATE (PF) 4 MG/ML IV SOLN
4.0000 mg | Freq: Once | INTRAVENOUS | Status: AC
  Administered 2015-11-19: 4 mg via INTRAVENOUS
  Filled 2015-11-19: qty 1

## 2015-11-19 MED ORDER — HYDROCODONE-ACETAMINOPHEN 5-325 MG PO TABS: 1.0000 | ORAL_TABLET | Freq: Four times a day (QID) | ORAL | Status: DC | PRN

## 2015-11-19 MED ORDER — HYDROMORPHONE HCL 1 MG/ML IJ SOLN: 1.0000 mg | Freq: Once | INTRAMUSCULAR | Status: DC

## 2015-11-19 MED ORDER — CEPHALEXIN 500 MG PO CAPS: 500.0000 mg | ORAL_CAPSULE | Freq: Four times a day (QID) | ORAL | Status: DC

## 2015-11-19 MED ORDER — ACETAMINOPHEN 325 MG PO TABS: 325.0000 mg | ORAL_TABLET | Freq: Once | ORAL | Status: DC

## 2015-11-19 MED ORDER — CEFTRIAXONE SODIUM 1 G IJ SOLR
1.0000 g | Freq: Once | INTRAMUSCULAR | Status: AC
  Administered 2015-11-19: 1 g via INTRAVENOUS
  Filled 2015-11-19: qty 10

## 2015-11-19 MED ORDER — ONDANSETRON HCL 4 MG/2ML IJ SOLN
4.0000 mg | Freq: Once | INTRAMUSCULAR | Status: AC
  Administered 2015-11-19: 4 mg via INTRAVENOUS
  Filled 2015-11-19: qty 2

## 2015-11-19 NOTE — ED Notes (Signed)
Reports abd pain for the past 3 days, worse this morning. Also reports vomiting.

## 2015-11-19 NOTE — ED Notes (Signed)
Pt. Is going to CT 1611

## 2015-11-19 NOTE — ED Provider Notes (Signed)
CSN: [id number]     Arrival date & time 11/19/15  1017 History   First MD Initiated Contact with Patient 11/19/15 1247     Chief Complaint  Patient presents with  . Abdominal Pain  . Emesis   Patient is a 59 y.o. female presenting with abdominal pain and vomiting. A language interpreter was used.  Abdominal Pain Pain location:  LLQ, RLQ and periumbilical Pain radiates to:  Does not radiate Pain severity:  Severe Duration:  3 days Progression:  Worsening Context: not previous surgeries   Relieved by:  Nothing Worsened by:  Belching and palpation Ineffective treatments:  OTC medications Associated symptoms: nausea and vomiting   Associated symptoms: no chills, no constipation, no diarrhea, no dysuria, no fever and no hematemesis   Emesis Associated symptoms: abdominal pain   Associated symptoms: no chills and no diarrhea     Brandi Bryant is a 59 year old female with PMHx of HTN, DM, breast cancer, CVA and hepatic steatosis presenting with abdominal pain. She reports onset of abdominal pain approximately 3 days ago. She states that it came on gradually then improved but acutely worsened last evening. The pain is located primarily over her lower abdomen. She states that it radiates across from left to right. She has tried Pepto-Bismol without relief. She notes that when she belches the pain gets worse. She is still having normal BMs and passing flatus. Associated symptoms include nausea and vomiting. She has no history of abdominal surgeries. Denies fevers, chills, diarrhea, dysuria, flank pain or back pain.  Past Medical History  Diagnosis Date  . Breast cancer ([place])   . HTN (hypertension)   . Diabetes mellitus   . Cholelithiasis   . Hepatic steatosis   . Abdominal pain   . History of breast cancer 06/01/2012  . Arthralgia 08/13/2013  . Chest pain 09/30/2013  . Stroke [facility]) 2006  . Bell's palsy   . Coronary artery disease   . Colon cancer screening 10/27/2015  .  Encounter for screening colonoscopy 10/30/2015   Past Surgical History  Procedure Laterality Date  . Tubal ligation    . Breast lumpectomy  2010  . Left heart catheterization with coronary angiogram N/A 10/30/2013    Procedure: LEFT HEART CATHETERIZATION WITH CORONARY ANGIOGRAM;  Surgeon: Josue Hector, MD;  Location: [facility] CATH LAB;  Service: Cardiovascular;  Laterality: N/A;   Family History  Problem Relation Age of Onset  . Thyroid cancer Sister   . Hypertension Mother   . Diabetes Mother   . Migraines Daughter    Social History  Substance Use Topics  . Smoking status: Never Smoker   . Smokeless tobacco: Never Used  . Alcohol Use: No   OB History    Gravida Para Term Preterm AB TAB SAB Ectopic Multiple Living   6 6 6       6      Review of Systems  Constitutional: Negative for fever and chills.  Gastrointestinal: Positive for nausea, vomiting and abdominal pain. Negative for diarrhea, constipation and hematemesis.  Genitourinary: Negative for dysuria and flank pain.  Musculoskeletal: Negative for back pain.  All other systems reviewed and are negative.     Allergies  Gadolinium derivatives  Home Medications   Prior to Admission medications   Medication Sig Start Date End Date Taking? Authorizing Provider  albuterol (PROVENTIL HFA;VENTOLIN HFA) 108 (90 BASE) MCG/ACT inhaler Inhale 1-2 puffs into the lungs every 6 (six) hours as needed for wheezing. 01/30/15  Yes Tanna Furry,  MD  atorvastatin (LIPITOR) 10 MG tablet Take 1 tablet (10 mg total) by mouth daily. 03/30/15  Yes [name], MD  carvedilol (COREG) 12.5 MG tablet Take 1 tablet (12.5 mg total) by mouth 2 (two) times daily with a meal. 10/07/15  Yes Renella Cunas, MD  gabapentin (NEURONTIN) 100 MG capsule TAKE 1 CAPSULE BY MOUTH 3 TIMES DAILY 09/28/15  Yes Lance Bosch, NP  ibuprofen (ADVIL,MOTRIN) 200 MG tablet Take 400-800 mg by mouth every 4 (four) hours as needed for moderate pain.    Yes Historical Provider, MD   losartan (COZAAR) 100 MG tablet Take 1 tablet (100 mg total) by mouth daily. 09/16/15  Yes Lance Bosch, NP  metFORMIN (GLUCOPHAGE) 1000 MG tablet TAKE 1 TABLET BY MOUTH 2 TIMES DAILY WITH A MEAL. 09/16/15  Yes Lance Bosch, NP  ranitidine (ZANTAC) 150 MG tablet Take 150 mg by mouth 2 (two) times daily.   Yes Historical Provider, MD  calcium carbonate (TUMS - DOSED IN MG ELEMENTAL CALCIUM) 500 MG chewable tablet Chew 1 tablet by mouth daily. Reported on 11/19/2015    Historical Provider, MD  HYDROcodone-acetaminophen (NORCO/VICODIN) 5-325 MG tablet Take 1 tablet by mouth every 6 (six) hours as needed. 11/19/15   Senta Kantor, PA-C  ondansetron (ZOFRAN) 4 MG tablet Take 1 tablet (4 mg total) by mouth every 6 (six) hours. 11/19/15   Dashae Wilcher, PA-C  pantoprazole (PROTONIX) 40 MG tablet Take 1 tablet (40 mg total) by mouth daily. 10/07/15   Renella Cunas, MD  PRESCRIPTION MEDICATION Take 0.5 tablets by mouth daily. Patient takes a sleep medication her daughter gives her however patient doesn't know what it is called.    Historical Provider, MD   BP 146/72 mmHg  Pulse 104  Temp(Src) 98.4 F (36.9 C) (Oral)  Resp 14  Wt 66.225 kg  SpO2 95% Physical Exam  Constitutional: She appears well-developed and well-nourished. She appears distressed.  Tearful  HENT:  Head: Normocephalic and atraumatic.  Eyes: Conjunctivae are normal. Right eye exhibits no discharge. Left eye exhibits no discharge. No scleral icterus.  Neck: Normal range of motion.  Cardiovascular: Normal rate and regular rhythm.   Pulmonary/Chest: Effort normal. No respiratory distress.  Abdominal: Soft. There is tenderness. There is guarding (voluntary). There is no rigidity and no rebound.    Musculoskeletal: Normal range of motion.  Neurological: She is alert. Coordination normal.  Skin: Skin is warm and dry.  Psychiatric: She has a normal mood and affect. Her behavior is normal.  Nursing note and vitals  reviewed.   ED Course  Procedures (including critical care time) Labs Review Labs Reviewed  COMPREHENSIVE METABOLIC PANEL - Abnormal; Notable for the following:    Glucose, Bld 174 (*)    Total Bilirubin 1.4 (*)    All other components within normal limits  LIPASE, BLOOD  CBC  URINALYSIS, ROUTINE W REFLEX MICROSCOPIC (NOT AT [facility])    Imaging Review Ct Abdomen Pelvis W Contrast  11/19/2015  CLINICAL DATA:  Severe onset abdominal pain 11/15/2015 which improved and indicating worse last night. Nausea and vomiting. EXAM: CT ABDOMEN AND PELVIS WITH CONTRAST TECHNIQUE: Multidetector CT imaging of the abdomen and pelvis was performed using the standard protocol following bolus administration of intravenous contrast. CONTRAST:  167mL OMNIPAQUE IOHEXOL 300 MG/ML  SOLN COMPARISON:  01/31/2012, 01/13/2009 as well as MRI abdomen 06/12/2009 FINDINGS: Lung bases demonstrate a 2 mm calcified granuloma over the right lower lobe. Abdominal images demonstrate a mild nodular contour  to the liver without evidence of focal liver mass. There is mild cholelithiasis. Mild dilatation of the common bile duct measuring 11 mm at the level of the pancreatic head. The pancreatic duct and remainder of the pancreas is within normal. The spleen, adrenal glands and stomach are within normal. Kidneys are normal in size without hydronephrosis or nephrolithiasis. There is a 2.1 cm hypodensity over the mid to lower pole of the left kidney likely a hyperdense cyst and not significantly changed. There several other smaller sub cm left renal cortical hypodensities too small to characterize but likely cysts. Mild lobulation of the renal contours. Ureters are within normal. Minimal calcified plaque of the abdominal aorta and iliac arteries. There is diverticulosis throughout the colon without evidence of active inflammation. Small bowel is within normal. The appendix is normal. There is no free fluid. Mesentery is within normal. Pelvic  images demonstrate possible fibroid over the anterior uterus. Bladder, rectum and ovaries are within normal. There is no free pelvic fluid. Minimal degenerative change of the spine and hips. IMPRESSION: No acute findings in the abdomen/ pelvis. Mild cholelithiasis. Dilatation of the common bile duct at the level of the head of the pancreas measuring 11 mm consider further evaluation with ultrasound versus MRCP. Mild nodular contour of the liver which may represent a degree of cirrhosis. 2.1 cm hypodensity of the mid to upper pole of the left kidney likely a hyperdense cyst and not significantly changed from 2010. Several other smaller sub cm left renal cortical hypodensities too small to characterize but likely cysts. Diverticulosis of the colon. Possible anterior uterine fibroid. Electronically Signed   By: Marin Olp M.D.   On: 11/19/2015 15:11   I have personally reviewed and evaluated these images and lab results as part of my medical decision-making.   EKG Interpretation None      MDM   Final diagnoses:  Lower abdominal pain  Nausea and vomiting, vomiting of unspecified type   59 year old female presenting with lower abdominal pain x 3 days. Associated symptoms include nausea and vomiting. VSS. Patient is nontoxic, nonseptic appearing. She is tearful and appears to be in pain. Abdomen is soft with tenderness in lower quadrants with voluntary guarding. No peritoneal signs suggesting surgical abdomen. Patient's pain and other symptoms adequately managed in emergency department. Fluid bolus given. Labs, imaging and vitals reviewed. Total bili slightly elevated to 1.4. Abd CT/pelv shows cholelithiasis with dilation of common bile duct to 11 mm with recommendations for abdominal US. Patient care signed out to oncoming provider, Dr. Darl Householder, pending RUQ [place] results.      Josephina Gip, PA-C 11/19/15 [place], MD 11/20/15 [phone]

## 2015-11-19 NOTE — Discharge Instructions (Signed)
Use zofran for nausea. Use vicodin for breakthrough pain.   Take keflex for UTI.   See surgery for gallstones.   Return to ER if you have severe abdominal pain, vomiting, fevers.   Colelitiasis  (Cholelithiasis)    La colelitiasis (tambin llamada clculos en la vescula) es una enfermedad en la que se forman piedras en la vescula. La vescula es un rgano que almacena la bilis que se forma en el hgado y que ayuda a Licensed conveyancer. Los clculos comienzan como pequeos cristales y lentamente se transforman en piedras. El dolor en la vescula ocurre cuando se producen espasmos y los clculos obstruyen el conducto. El dolor tambin se produce cuando una piedra sale por el conducto.  FACTORES DE RIESGO  Ser mujer.  Tener embarazos mltiples. Algunas veces los mdicos aconsejan extirpar los clculos biliares antes de futuros embarazos.  Ser obeso.  Dietas que incluyan comidas fritas y grasas.  Ser mayor de 75 aos y el aumento de la edad.  El uso prolongado de medicamentos que contengan hormonas femeninas.  Tener diabetes mellitus.  Prdida rpida de peso.  Historia familiar de clculos (herencia).  SNTOMAS  Nuseas.  Vmitos.  Dolor abdominal.  Piel amarilla (ictericia)  Dolor sbito. Puede persistir desde algunos minutos hasta algunas horas.  Cristy Hilts.  Sensibilidad al tacto.  En algunos casos, cuando los clculos biliares no se mueven hacia el conducto biliar, las personas no sienten dolor ni presentan sntomas. Estos se denominan clculos "silenciosos".  TRATAMIENTO  Los clculos silenciosos no requieren Clinical research associate. En los [employer], podr ser [place]. Las opciones de tratamiento son:  Dwaine Gale para extirpar la vescula. Es el tratamiento ms frecuente.  Medicamentos. No siempre dan resultado y pueden demorar entre 6 y 33 meses o ms en Chief of Staff.  Tratamiento con ondas de choque (litotricia biliar extracorporal). En este tratamiento, una mquina  de ultrasonido enva ondas de choque a la vescula para destruir los clculos en pequeos fragmentos que luego podrn pasar a los intestinos o ser disueltas con medicamentos. INSTRUCCIONES PARA EL CUIDADO EN EL HOGAR  Slo tome medicamentos de venta libre o recetados para Glass blower/designer, Health and safety inspector o bajar la fiebre, segn las indicaciones de su mdico.  Siga una dieta baja en grasas hasta que su mdico lo vea nuevamente. Las grasas hacen que la vescula se Location manager, lo que puede Orthoptist.  Concurra a las consultas de control con su mdico segn las indicaciones. Los ataques casi siempre son recurrentes y generalmente habr que someterse a una ciruga como [place].  SOLICITE ATENCIN MDICA DE INMEDIATO SI:  El dolor aumenta y no puede controlarlo con los medicamentos.  Tiene fiebre o sntomas persistentes durante ms de 2 - 3 das.  Tiene fiebre y los sntomas empeoran repentinamente.  Tiene nuseas o vmitos persistentes.  ASEGRESE DE QUE:  Comprende estas instrucciones.  Controlar su afeccin.  Recibir ayuda de inmediato si no mejora o si empeora. Esta informacin no tiene Marine scientist el consejo del mdico. Asegrese de hacerle al mdico cualquier pregunta que tenga.  Document Released: 08/24/2006 Document Revised: 07/10/2013  Elsevier Interactive Patient Education [employer].

## 2015-11-19 NOTE — ED Provider Notes (Signed)
  Physical Exam  BP 146/72 mmHg  Pulse 104  Temp(Src) 98.4 F (36.9 C) (Oral)  Resp 14  Wt 146 lb (66.225 kg)  SpO2 95%  Physical Exam  ED Course  Procedures  MDM Care assumed at sign out. Patient has lower abdominal pain. CT showed cholelithiasis possible dilated CBD but patient has no RUQ tenderness. Sign out pending RUQ [place] and if neg can dc home. US showed cholelithiasis, no cholecystitis and CBD nl. Patient has suprapubic tenderness on exam. UA + nitrate and bacteria. Given ceftriaxone, will dc home with keflex. Will give surgery referral for cholelithiasis.   Wandra Arthurs, MD 11/19/15 [phone]

## 2015-12-08 ENCOUNTER — Observation Stay (HOSPITAL_COMMUNITY)
Admission: EM | Admit: 2015-12-08 | Discharge: 2015-12-11 | Disposition: A | Discharge status: Home / Self Care | Payer: Self-pay | Attending: General Surgery | Admitting: General Surgery

## 2015-12-08 ENCOUNTER — Encounter (HOSPITAL_COMMUNITY): Payer: Self-pay

## 2015-12-08 DIAGNOSIS — I2510 Atherosclerotic heart disease of native coronary artery without angina pectoris: Secondary | ICD-10-CM | POA: Insufficient documentation

## 2015-12-08 DIAGNOSIS — K8020 Calculus of gallbladder without cholecystitis without obstruction: Secondary | ICD-10-CM | POA: Diagnosis present

## 2015-12-08 DIAGNOSIS — K760 Fatty (change of) liver, not elsewhere classified: Secondary | ICD-10-CM | POA: Insufficient documentation

## 2015-12-08 DIAGNOSIS — K7460 Unspecified cirrhosis of liver: Secondary | ICD-10-CM | POA: Insufficient documentation

## 2015-12-08 DIAGNOSIS — I10 Essential (primary) hypertension: Secondary | ICD-10-CM | POA: Insufficient documentation

## 2015-12-08 DIAGNOSIS — E785 Hyperlipidemia, unspecified: Secondary | ICD-10-CM | POA: Insufficient documentation

## 2015-12-08 DIAGNOSIS — K8010 Calculus of gallbladder with chronic cholecystitis without obstruction: Principal | ICD-10-CM | POA: Insufficient documentation

## 2015-12-08 DIAGNOSIS — E119 Type 2 diabetes mellitus without complications: Secondary | ICD-10-CM | POA: Insufficient documentation

## 2015-12-08 DIAGNOSIS — K831 Obstruction of bile duct: Secondary | ICD-10-CM

## 2015-12-08 DIAGNOSIS — Z419 Encounter for procedure for purposes other than remedying health state, unspecified: Secondary | ICD-10-CM

## 2015-12-08 DIAGNOSIS — Z8673 Personal history of transient ischemic attack (TIA), and cerebral infarction without residual deficits: Secondary | ICD-10-CM | POA: Insufficient documentation

## 2015-12-08 DIAGNOSIS — Z853 Personal history of malignant neoplasm of breast: Secondary | ICD-10-CM | POA: Insufficient documentation

## 2015-12-08 DIAGNOSIS — R1013 Epigastric pain: Secondary | ICD-10-CM

## 2015-12-08 DIAGNOSIS — Z7984 Long term (current) use of oral hypoglycemic drugs: Secondary | ICD-10-CM | POA: Insufficient documentation

## 2015-12-08 LAB — COMPREHENSIVE METABOLIC PANEL
ALBUMIN: 3.6 g/dL (ref 3.5–5.0)
ALT: 23 U/L (ref 14–54)
ANION GAP: 12 (ref 5–15)
AST: 31 U/L (ref 15–41)
Alkaline Phosphatase: 89 U/L (ref 38–126)
BILIRUBIN TOTAL: 0.5 mg/dL (ref 0.3–1.2)
BUN: 11 mg/dL (ref 6–20)
CALCIUM: 9.7 mg/dL (ref 8.9–10.3)
CO2: 26 mmol/L (ref 22–32)
Chloride: 103 mmol/L (ref 101–111)
Creatinine, Ser: 0.77 mg/dL (ref 0.44–1.00)
GLUCOSE: 158 mg/dL — AB (ref 65–99)
POTASSIUM: 3.9 mmol/L (ref 3.5–5.1)
Sodium: 141 mmol/L (ref 135–145)
TOTAL PROTEIN: 6.5 g/dL (ref 6.5–8.1)

## 2015-12-08 LAB — CBC
HEMATOCRIT: 39.9 % (ref 36.0–46.0)
HEMOGLOBIN: 13.5 g/dL (ref 12.0–15.0)
MCH: 29.5 pg (ref 26.0–34.0)
MCHC: 33.8 g/dL (ref 30.0–36.0)
MCV: 87.3 fL (ref 78.0–100.0)
Platelets: 167 10*3/uL (ref 150–400)
RBC: 4.57 MIL/uL (ref 3.87–5.11)
RDW: 12.9 % (ref 11.5–15.5)
WBC: 7.4 10*3/uL (ref 4.0–10.5)

## 2015-12-08 LAB — URINALYSIS, ROUTINE W REFLEX MICROSCOPIC
BILIRUBIN URINE: NEGATIVE
Glucose, UA: NEGATIVE mg/dL
Hgb urine dipstick: NEGATIVE
Ketones, ur: NEGATIVE mg/dL
LEUKOCYTES UA: NEGATIVE
NITRITE: NEGATIVE
PH: 7 (ref 5.0–8.0)
Protein, ur: NEGATIVE mg/dL
SPECIFIC GRAVITY, URINE: 1.012 (ref 1.005–1.030)

## 2015-12-08 LAB — LIPASE, BLOOD: Lipase: 33 U/L (ref 11–51)

## 2015-12-08 MED ORDER — OXYCODONE-ACETAMINOPHEN 5-325 MG PO TABS
1.0000 | ORAL_TABLET | Freq: Once | ORAL | Status: AC
  Administered 2015-12-08: 1 via ORAL

## 2015-12-08 MED ORDER — ONDANSETRON 4 MG PO TBDP
4.0000 mg | ORAL_TABLET | Freq: Once | ORAL | Status: AC | PRN
  Administered 2015-12-08: 4 mg via ORAL

## 2015-12-08 MED ORDER — OXYCODONE-ACETAMINOPHEN 5-325 MG PO TABS
ORAL_TABLET | ORAL | Status: AC
  Filled 2015-12-08: qty 1

## 2015-12-08 MED ORDER — ONDANSETRON 4 MG PO TBDP
ORAL_TABLET | ORAL | Status: AC
  Filled 2015-12-08: qty 1

## 2015-12-08 NOTE — ED Notes (Addendum)
Pt was here on 11/18/15 and told she had stones in her gallbladder. Is back for abd pain, nausea and vomiting. Called the number given to her for follow up and they were suppose to call her back with an appt time and they never did.

## 2015-12-09 ENCOUNTER — Observation Stay (HOSPITAL_COMMUNITY): Payer: Self-pay

## 2015-12-09 ENCOUNTER — Observation Stay (HOSPITAL_COMMUNITY): Payer: Self-pay | Admitting: Certified Registered Nurse Anesthetist

## 2015-12-09 ENCOUNTER — Encounter (HOSPITAL_COMMUNITY)
Admission: EM | Disposition: A | Discharge status: Home / Self Care | Payer: Self-pay | Source: Home / Self Care | Attending: Emergency Medicine

## 2015-12-09 ENCOUNTER — Emergency Department (HOSPITAL_COMMUNITY): Payer: Self-pay

## 2015-12-09 ENCOUNTER — Encounter (HOSPITAL_COMMUNITY): Payer: Self-pay | Admitting: Neurology

## 2015-12-09 DIAGNOSIS — K8020 Calculus of gallbladder without cholecystitis without obstruction: Secondary | ICD-10-CM | POA: Diagnosis present

## 2015-12-09 HISTORY — PX: CHOLECYSTECTOMY: SHX55

## 2015-12-09 LAB — GLUCOSE, CAPILLARY
GLUCOSE-CAPILLARY: 131 mg/dL — AB (ref 65–99)
GLUCOSE-CAPILLARY: 152 mg/dL — AB (ref 65–99)
Glucose-Capillary: 250 mg/dL — ABNORMAL HIGH (ref 65–99)
Glucose-Capillary: 161 mg/dL — ABNORMAL HIGH (ref 65–99)
Glucose-Capillary: 139 mg/dL — ABNORMAL HIGH (ref 65–99)

## 2015-12-09 LAB — MRSA PCR SCREENING: MRSA by PCR: NEGATIVE

## 2015-12-09 SURGERY — LAPAROSCOPIC CHOLECYSTECTOMY WITH INTRAOPERATIVE CHOLANGIOGRAM
Anesthesia: General | Site: Abdomen

## 2015-12-09 MED ORDER — LOSARTAN POTASSIUM 50 MG PO TABS
100.0000 mg | ORAL_TABLET | Freq: Every day | ORAL | Status: DC
  Filled 2015-12-09 (×2): qty 2

## 2015-12-09 MED ORDER — ONDANSETRON HCL 4 MG/2ML IJ SOLN
INTRAMUSCULAR | Status: AC
  Filled 2015-12-09: qty 2

## 2015-12-09 MED ORDER — GLUCAGON HCL RDNA (DIAGNOSTIC) 1 MG IJ SOLR
INTRAMUSCULAR | Status: DC | PRN
  Administered 2015-12-09: 1 mg via INTRAVENOUS

## 2015-12-09 MED ORDER — HYDROMORPHONE HCL 1 MG/ML IJ SOLN
INTRAMUSCULAR | Status: AC
  Filled 2015-12-09: qty 1

## 2015-12-09 MED ORDER — KETOROLAC TROMETHAMINE 30 MG/ML IJ SOLN
INTRAMUSCULAR | Status: DC | PRN
  Administered 2015-12-09: 30 mg via INTRAVENOUS

## 2015-12-09 MED ORDER — SIMETHICONE 80 MG PO CHEW
40.0000 mg | CHEWABLE_TABLET | Freq: Four times a day (QID) | ORAL | Status: DC | PRN
  Administered 2015-12-10: 40 mg via ORAL
  Filled 2015-12-09: qty 1

## 2015-12-09 MED ORDER — GLUCAGON HCL RDNA (DIAGNOSTIC) 1 MG IJ SOLR
INTRAMUSCULAR | Status: AC
  Filled 2015-12-09: qty 1

## 2015-12-09 MED ORDER — DEXTROSE 5 % IV SOLN
2.0000 g | INTRAVENOUS | Status: DC
  Filled 2015-12-09: qty 2

## 2015-12-09 MED ORDER — GABAPENTIN 100 MG PO CAPS
100.0000 mg | ORAL_CAPSULE | Freq: Three times a day (TID) | ORAL | Status: DC
  Administered 2015-12-09 – 2015-12-11 (×6): 100 mg via ORAL
  Filled 2015-12-09 (×6): qty 1

## 2015-12-09 MED ORDER — MORPHINE SULFATE (PF) 2 MG/ML IV SOLN
1.0000 mg | INTRAVENOUS | Status: DC | PRN
  Administered 2015-12-09 (×2): 4 mg via INTRAVENOUS
  Administered 2015-12-09: 2 mg via INTRAVENOUS
  Administered 2015-12-09 – 2015-12-10 (×3): 4 mg via INTRAVENOUS
  Administered 2015-12-10: 2 mg via INTRAVENOUS
  Administered 2015-12-10: 4 mg via INTRAVENOUS
  Filled 2015-12-09 (×2): qty 1
  Filled 2015-12-09 (×6): qty 2

## 2015-12-09 MED ORDER — LACTATED RINGERS IV SOLN
INTRAVENOUS | Status: DC
  Administered 2015-12-09 (×2): via INTRAVENOUS

## 2015-12-09 MED ORDER — ONDANSETRON HCL 4 MG/2ML IJ SOLN
INTRAMUSCULAR | Status: DC | PRN
  Administered 2015-12-09: 4 mg via INTRAVENOUS

## 2015-12-09 MED ORDER — HYDROMORPHONE HCL 1 MG/ML IJ SOLN
1.0000 mg | Freq: Once | INTRAMUSCULAR | Status: AC
  Administered 2015-12-09: 1 mg via INTRAVENOUS
  Filled 2015-12-09: qty 1

## 2015-12-09 MED ORDER — DEXTROSE 5 % IV SOLN
2.0000 g | INTRAVENOUS | Status: AC
  Administered 2015-12-09: 2 g via INTRAVENOUS
  Filled 2015-12-09: qty 2

## 2015-12-09 MED ORDER — PHENYLEPHRINE 40 MCG/ML (10ML) SYRINGE FOR IV PUSH (FOR BLOOD PRESSURE SUPPORT)
PREFILLED_SYRINGE | INTRAVENOUS | Status: AC
  Filled 2015-12-09: qty 10

## 2015-12-09 MED ORDER — PHENYLEPHRINE HCL 10 MG/ML IJ SOLN
INTRAMUSCULAR | Status: DC | PRN
  Administered 2015-12-09 (×2): 80 ug via INTRAVENOUS

## 2015-12-09 MED ORDER — SODIUM CHLORIDE 0.9 % IV SOLN
INTRAVENOUS | Status: DC | PRN
  Administered 2015-12-09: 40 mL

## 2015-12-09 MED ORDER — METHOCARBAMOL 500 MG PO TABS
500.0000 mg | ORAL_TABLET | Freq: Four times a day (QID) | ORAL | Status: DC | PRN
  Administered 2015-12-09 – 2015-12-10 (×2): 500 mg via ORAL
  Filled 2015-12-09 (×2): qty 1

## 2015-12-09 MED ORDER — INSULIN ASPART 100 UNIT/ML ~~LOC~~ SOLN
SUBCUTANEOUS | Status: AC
  Filled 2015-12-09: qty 2

## 2015-12-09 MED ORDER — HYDROMORPHONE HCL 1 MG/ML IJ SOLN
0.2500 mg | INTRAMUSCULAR | Status: DC | PRN
  Administered 2015-12-09 (×4): 0.25 mg via INTRAVENOUS

## 2015-12-09 MED ORDER — FENTANYL CITRATE (PF) 100 MCG/2ML IJ SOLN
INTRAMUSCULAR | Status: DC | PRN
  Administered 2015-12-09 (×4): 50 ug via INTRAVENOUS
  Administered 2015-12-09: 100 ug via INTRAVENOUS

## 2015-12-09 MED ORDER — DIPHENHYDRAMINE HCL 12.5 MG/5ML PO ELIX
12.5000 mg | ORAL_SOLUTION | Freq: Four times a day (QID) | ORAL | Status: DC | PRN
  Administered 2015-12-09: 12.5 mg via ORAL
  Filled 2015-12-09: qty 10

## 2015-12-09 MED ORDER — ONDANSETRON 4 MG PO TBDP
4.0000 mg | ORAL_TABLET | Freq: Four times a day (QID) | ORAL | Status: DC | PRN
  Filled 2015-12-09: qty 1

## 2015-12-09 MED ORDER — DIPHENHYDRAMINE HCL 25 MG PO CAPS: 25.0000 mg | ORAL_CAPSULE | Freq: Four times a day (QID) | ORAL | Status: DC | PRN

## 2015-12-09 MED ORDER — SODIUM CHLORIDE 0.9 % IR SOLN
Status: DC | PRN
  Administered 2015-12-09 (×2): 1000 mL

## 2015-12-09 MED ORDER — FENTANYL CITRATE (PF) 250 MCG/5ML IJ SOLN
INTRAMUSCULAR | Status: AC
  Filled 2015-12-09: qty 5

## 2015-12-09 MED ORDER — LIDOCAINE HCL (CARDIAC) 20 MG/ML IV SOLN
INTRAVENOUS | Status: AC
  Filled 2015-12-09: qty 5

## 2015-12-09 MED ORDER — DIPHENHYDRAMINE HCL 50 MG/ML IJ SOLN: 25.0000 mg | Freq: Four times a day (QID) | INTRAMUSCULAR | Status: DC | PRN

## 2015-12-09 MED ORDER — MIDAZOLAM HCL 5 MG/5ML IJ SOLN
INTRAMUSCULAR | Status: DC | PRN
  Administered 2015-12-09: 2 mg via INTRAVENOUS

## 2015-12-09 MED ORDER — ROCURONIUM BROMIDE 100 MG/10ML IV SOLN
INTRAVENOUS | Status: DC | PRN
  Administered 2015-12-09: 30 mg via INTRAVENOUS
  Administered 2015-12-09: 10 mg via INTRAVENOUS

## 2015-12-09 MED ORDER — SUGAMMADEX SODIUM 200 MG/2ML IV SOLN
INTRAVENOUS | Status: DC | PRN
  Administered 2015-12-09: 240 mg via INTRAVENOUS

## 2015-12-09 MED ORDER — PANTOPRAZOLE SODIUM 40 MG IV SOLR
40.0000 mg | Freq: Every day | INTRAVENOUS | Status: DC
  Administered 2015-12-09: 40 mg via INTRAVENOUS
  Filled 2015-12-09 (×2): qty 40

## 2015-12-09 MED ORDER — LIDOCAINE HCL (CARDIAC) 20 MG/ML IV SOLN
INTRAVENOUS | Status: DC | PRN
  Administered 2015-12-09: 60 mg via INTRAVENOUS

## 2015-12-09 MED ORDER — BUPIVACAINE-EPINEPHRINE 0.25% -1:200000 IJ SOLN
INTRAMUSCULAR | Status: DC | PRN
  Administered 2015-12-09: 30 mL

## 2015-12-09 MED ORDER — MIDAZOLAM HCL 2 MG/2ML IJ SOLN
INTRAMUSCULAR | Status: AC
  Filled 2015-12-09: qty 2

## 2015-12-09 MED ORDER — ONDANSETRON HCL 4 MG/2ML IJ SOLN
4.0000 mg | Freq: Once | INTRAMUSCULAR | Status: AC
  Administered 2015-12-09: 4 mg via INTRAVENOUS
  Filled 2015-12-09: qty 2

## 2015-12-09 MED ORDER — BUPIVACAINE-EPINEPHRINE (PF) 0.25% -1:200000 IJ SOLN
INTRAMUSCULAR | Status: AC
  Filled 2015-12-09: qty 30

## 2015-12-09 MED ORDER — KETOROLAC TROMETHAMINE 30 MG/ML IJ SOLN
INTRAMUSCULAR | Status: AC
  Filled 2015-12-09: qty 1

## 2015-12-09 MED ORDER — SUGAMMADEX SODIUM 200 MG/2ML IV SOLN
INTRAVENOUS | Status: AC
  Filled 2015-12-09: qty 2

## 2015-12-09 MED ORDER — ALBUTEROL SULFATE (2.5 MG/3ML) 0.083% IN NEBU: 2.5000 mg | INHALATION_SOLUTION | Freq: Four times a day (QID) | RESPIRATORY_TRACT | Status: DC | PRN

## 2015-12-09 MED ORDER — 0.9 % SODIUM CHLORIDE (POUR BTL) OPTIME
TOPICAL | Status: DC | PRN
  Administered 2015-12-09: 1000 mL

## 2015-12-09 MED ORDER — HYDROMORPHONE HCL 1 MG/ML IJ SOLN
1.0000 mg | INTRAMUSCULAR | Status: DC | PRN
  Administered 2015-12-09 (×2): 1 mg via INTRAVENOUS
  Filled 2015-12-09 (×2): qty 1

## 2015-12-09 MED ORDER — DEXAMETHASONE SODIUM PHOSPHATE 4 MG/ML IJ SOLN
INTRAMUSCULAR | Status: AC
  Filled 2015-12-09: qty 1

## 2015-12-09 MED ORDER — PROPOFOL 10 MG/ML IV BOLUS
INTRAVENOUS | Status: AC
  Filled 2015-12-09: qty 20

## 2015-12-09 MED ORDER — DEXAMETHASONE SODIUM PHOSPHATE 4 MG/ML IJ SOLN
INTRAMUSCULAR | Status: DC | PRN
  Administered 2015-12-09: 4 mg via INTRAVENOUS

## 2015-12-09 MED ORDER — PROPOFOL 10 MG/ML IV BOLUS
INTRAVENOUS | Status: DC | PRN
  Administered 2015-12-09: 30 mg via INTRAVENOUS
  Administered 2015-12-09: 120 mg via INTRAVENOUS

## 2015-12-09 MED ORDER — ROCURONIUM BROMIDE 50 MG/5ML IV SOLN
INTRAVENOUS | Status: AC
  Filled 2015-12-09: qty 1

## 2015-12-09 MED ORDER — INSULIN ASPART 100 UNIT/ML ~~LOC~~ SOLN
2.0000 [IU] | Freq: Once | SUBCUTANEOUS | Status: AC
  Administered 2015-12-09: 2 [IU] via SUBCUTANEOUS

## 2015-12-09 MED ORDER — CARVEDILOL 12.5 MG PO TABS
12.5000 mg | ORAL_TABLET | Freq: Two times a day (BID) | ORAL | Status: DC
  Administered 2015-12-09 – 2015-12-11 (×5): 12.5 mg via ORAL
  Filled 2015-12-09 (×6): qty 1

## 2015-12-09 MED ORDER — ENOXAPARIN SODIUM 40 MG/0.4ML ~~LOC~~ SOLN: 40.0000 mg | SUBCUTANEOUS | Status: DC

## 2015-12-09 MED ORDER — SODIUM CHLORIDE 0.9 % IV SOLN
INTRAVENOUS | Status: DC
  Administered 2015-12-09: 75 mL/h via INTRAVENOUS
  Administered 2015-12-09: 04:00:00 via INTRAVENOUS

## 2015-12-09 MED ORDER — SUCCINYLCHOLINE CHLORIDE 20 MG/ML IJ SOLN
INTRAMUSCULAR | Status: DC | PRN
  Administered 2015-12-09: 100 mg via INTRAVENOUS

## 2015-12-09 MED ORDER — ONDANSETRON HCL 4 MG/2ML IJ SOLN
4.0000 mg | Freq: Four times a day (QID) | INTRAMUSCULAR | Status: DC | PRN
  Administered 2015-12-09 – 2015-12-10 (×2): 4 mg via INTRAVENOUS
  Filled 2015-12-09 (×2): qty 2

## 2015-12-09 MED ORDER — DIPHENHYDRAMINE HCL 50 MG/ML IJ SOLN: 12.5000 mg | Freq: Four times a day (QID) | INTRAMUSCULAR | Status: DC | PRN

## 2015-12-09 SURGICAL SUPPLY — 56 items
APL SKNCLS STERI-STRIP NONHPOA (GAUZE/BANDAGES/DRESSINGS) ×1
APPLIER CLIP 5 13 M/L LIGAMAX5 (MISCELLANEOUS) ×3
APR CLP MED LRG 5 ANG JAW (MISCELLANEOUS) ×1
BAG SPEC RTRVL 10 TROC 200 (ENDOMECHANICALS) ×1
BANDAGE ADH SHEER 1  50/CT (GAUZE/BANDAGES/DRESSINGS) ×9 IMPLANT
BENZOIN TINCTURE PRP APPL 2/3 (GAUZE/BANDAGES/DRESSINGS) ×3 IMPLANT
BLADE SURG ROTATE 9660 (MISCELLANEOUS) IMPLANT
CANISTER SUCTION 2500CC (MISCELLANEOUS) ×3 IMPLANT
CHLORAPREP W/TINT 26ML (MISCELLANEOUS) ×3 IMPLANT
CLIP APPLIE 5 13 M/L LIGAMAX5 (MISCELLANEOUS) ×1 IMPLANT
CLOSURE WOUND 1/2 X4 (GAUZE/BANDAGES/DRESSINGS) ×1
COVER MAYO STAND STRL (DRAPES) ×3 IMPLANT
COVER SURGICAL LIGHT HANDLE (MISCELLANEOUS) ×3 IMPLANT
DRAPE C-ARM 42X72 X-RAY (DRAPES) ×3 IMPLANT
DRSG TEGADERM 4X4.75 (GAUZE/BANDAGES/DRESSINGS) ×3 IMPLANT
ELECT REM PT RETURN 9FT ADLT (ELECTROSURGICAL) ×3
ELECTRODE REM PT RTRN 9FT ADLT (ELECTROSURGICAL) ×1 IMPLANT
ENDOLOOP SUT PDS II  0 18 (SUTURE) ×4
ENDOLOOP SUT PDS II 0 18 (SUTURE) IMPLANT
GAUZE SPONGE 2X2 8PLY STRL LF (GAUZE/BANDAGES/DRESSINGS) ×1 IMPLANT
GLOVE BIO SURGEON STRL SZ7 (GLOVE) ×2 IMPLANT
GLOVE BIOGEL M STRL SZ7.5 (GLOVE) ×3 IMPLANT
GLOVE BIOGEL PI IND STRL 7.0 (GLOVE) IMPLANT
GLOVE BIOGEL PI IND STRL 8 (GLOVE) ×1 IMPLANT
GLOVE BIOGEL PI INDICATOR 7.0 (GLOVE) ×4
GLOVE BIOGEL PI INDICATOR 8 (GLOVE) ×2
GLOVE SURG SS PI 6.5 STRL IVOR (GLOVE) ×2 IMPLANT
GLOVE SURG SS PI 7.0 STRL IVOR (GLOVE) ×2 IMPLANT
GOWN STRL REUS W/ TWL LRG LVL3 (GOWN DISPOSABLE) ×3 IMPLANT
GOWN STRL REUS W/ TWL XL LVL3 (GOWN DISPOSABLE) ×1 IMPLANT
GOWN STRL REUS W/TWL LRG LVL3 (GOWN DISPOSABLE) ×6
GOWN STRL REUS W/TWL XL LVL3 (GOWN DISPOSABLE) ×3
KIT BASIN OR (CUSTOM PROCEDURE TRAY) ×3 IMPLANT
KIT ROOM TURNOVER OR (KITS) ×3 IMPLANT
L-HOOK LAP DISP 36CM (ELECTROSURGICAL) ×3
LHOOK LAP DISP 36CM (ELECTROSURGICAL) IMPLANT
LIQUID BAND (GAUZE/BANDAGES/DRESSINGS) ×2 IMPLANT
NS IRRIG 1000ML POUR BTL (IV SOLUTION) ×3 IMPLANT
PAD ARMBOARD 7.5X6 YLW CONV (MISCELLANEOUS) ×3 IMPLANT
PENCIL BUTTON HOLSTER BLD 10FT (ELECTRODE) ×2 IMPLANT
POUCH RETRIEVAL ECOSAC 10 (ENDOMECHANICALS) ×1 IMPLANT
POUCH RETRIEVAL ECOSAC 10MM (ENDOMECHANICALS) ×2
SCISSORS LAP 5X35 DISP (ENDOMECHANICALS) ×3 IMPLANT
SET CHOLANGIOGRAPH 5 50 .035 (SET/KITS/TRAYS/PACK) ×3 IMPLANT
SET IRRIG TUBING LAPAROSCOPIC (IRRIGATION / IRRIGATOR) ×3 IMPLANT
SLEEVE ENDOPATH XCEL 5M (ENDOMECHANICALS) ×6 IMPLANT
SPECIMEN JAR SMALL (MISCELLANEOUS) ×3 IMPLANT
SPONGE GAUZE 2X2 STER 10/PKG (GAUZE/BANDAGES/DRESSINGS) ×2
STRIP CLOSURE SKIN 1/2X4 (GAUZE/BANDAGES/DRESSINGS) ×2 IMPLANT
SUT MNCRL AB 4-0 PS2 18 (SUTURE) ×3 IMPLANT
TOWEL OR 17X24 6PK STRL BLUE (TOWEL DISPOSABLE) ×3 IMPLANT
TOWEL OR 17X26 10 PK STRL BLUE (TOWEL DISPOSABLE) ×3 IMPLANT
TRAY LAPAROSCOPIC MC (CUSTOM PROCEDURE TRAY) ×3 IMPLANT
TROCAR XCEL BLUNT TIP 100MML (ENDOMECHANICALS) ×3 IMPLANT
TROCAR XCEL NON-BLD 5MMX100MML (ENDOMECHANICALS) ×3 IMPLANT
TUBING INSUFFLATION (TUBING) ×3 IMPLANT

## 2015-12-09 NOTE — Anesthesia Preprocedure Evaluation (Addendum)
Anesthesia Evaluation  Patient identified by MRN, date of birth, ID band Patient awake    Reviewed: Allergy & Precautions, H&P , NPO status , Patient's Chart, lab work & pertinent test results, reviewed documented beta blocker date and time   Airway Mallampati: II  TM Distance: >3 FB Neck ROM: Full    Dental no notable dental hx. (+) Teeth Intact, Dental Advisory Given   Pulmonary neg pulmonary ROS,    Pulmonary exam normal breath sounds clear to auscultation       Cardiovascular hypertension, Pt. on medications and Pt. on home beta blockers  Rhythm:Regular Rate:Normal     Neuro/Psych  Headaches, TIACVA negative psych ROS   GI/Hepatic negative GI ROS, Neg liver ROS,   Endo/Other  diabetes, Type 2, Oral Hypoglycemic Agents  Renal/GU negative Renal ROS  negative genitourinary   Musculoskeletal   Abdominal   Peds  Hematology negative hematology ROS (+)   Anesthesia Other Findings   Reproductive/Obstetrics negative OB ROS                           Anesthesia Physical Anesthesia Plan  ASA: III  Anesthesia Plan: General   Post-op Pain Management:    Induction: Intravenous, Rapid sequence and Cricoid pressure planned  Airway Management Planned: Oral ETT  Additional Equipment:   Intra-op Plan:   Post-operative Plan: Extubation in OR  Informed Consent: I have reviewed the patients History and Physical, chart, labs and discussed the procedure including the risks, benefits and alternatives for the proposed anesthesia with the patient or authorized representative who has indicated his/her understanding and acceptance.   Dental advisory given  Plan Discussed with: CRNA  Anesthesia Plan Comments:        Anesthesia Quick Evaluation

## 2015-12-09 NOTE — ED Provider Notes (Signed)
CSN: [id number]     Arrival date & time 12/08/15  1933 History   First MD Initiated Contact with Patient 12/08/15 2333     Chief Complaint  Patient presents with  . Emesis  . Nausea  . Abdominal Pain     (Consider location/radiation/quality/duration/timing/severity/associated sxs/prior Treatment) HPI Comments: 60 year old female with history of diabetes, hypertension, breast cancer presents for abdominal pain and nausea with vomiting. The patient speaks limited English and her daughter is with her. She was offered a Optometrist but preferred to speak with her daughter present. The patient was seen here at the end of December for abdominal pain and had a CT that showed findings of cholelithiasis. This was followed up with an ultrasound that again showed cholelithiasis but no sign of acute cholecystitis. She says since that time she has continued to have abdominal pain and nausea. She called the surgeon's office to try to arrange outpatient follow-up and said that they never got back to her. She states that she has been nauseous and been having pain all over her abdomen but mostly in the upper abdomen and in the right upper quadrant. She reports that this pain got significantly worse today causing her to present to the emergency department. She denies fevers or chills. No constipation or diarrhea. Reports normal urination.  Patient is a 60 y.o. female presenting with vomiting and abdominal pain.  Emesis Associated symptoms: abdominal pain   Associated symptoms: no chills and no myalgias   Abdominal Pain Associated symptoms: vomiting   Associated symptoms: no chest pain, no chills, no cough, no dysuria, no fatigue, no fever, no hematuria and no shortness of breath     Past Medical History  Diagnosis Date  . Breast cancer ([place])   . HTN (hypertension)   . Diabetes mellitus   . Cholelithiasis   . Hepatic steatosis   . Abdominal pain   . History of breast cancer 06/01/2012  . Arthralgia  08/13/2013  . Chest pain 09/30/2013  . Stroke [facility]) 2006  . Bell's palsy   . Coronary artery disease   . Colon cancer screening 10/27/2015  . Encounter for screening colonoscopy 10/30/2015   Past Surgical History  Procedure Laterality Date  . Tubal ligation    . Breast lumpectomy  2010  . Left heart catheterization with coronary angiogram N/A 10/30/2013    Procedure: LEFT HEART CATHETERIZATION WITH CORONARY ANGIOGRAM;  Surgeon: Josue Hector, MD;  Location: [facility] CATH LAB;  Service: Cardiovascular;  Laterality: N/A;   Family History  Problem Relation Age of Onset  . Thyroid cancer Sister   . Hypertension Mother   . Diabetes Mother   . Migraines Daughter    Social History  Substance Use Topics  . Smoking status: Never Smoker   . Smokeless tobacco: Never Used  . Alcohol Use: No   OB History    Gravida Para Term Preterm AB TAB SAB Ectopic Multiple Living   6 6 6       6      Review of Systems  Constitutional: Negative for fever, chills and fatigue.  HENT: Negative for congestion, postnasal drip, rhinorrhea and sinus pressure.   Eyes: Negative for pain and redness.  Respiratory: Negative for cough, chest tightness and shortness of breath.   Cardiovascular: Negative for chest pain and palpitations.  Gastrointestinal: Positive for vomiting and abdominal pain.  Genitourinary: Negative for dysuria, urgency and hematuria.  Musculoskeletal: Negative for myalgias and back pain.  Skin: Negative for rash.  Neurological: Negative for  facial asymmetry, speech difficulty, weakness and light-headedness.  Hematological: Does not bruise/bleed easily.      Allergies  Gadolinium derivatives  Home Medications   Prior to Admission medications   Medication Sig Start Date End Date Taking? Authorizing Provider  albuterol (PROVENTIL HFA;VENTOLIN HFA) 108 (90 BASE) MCG/ACT inhaler Inhale 1-2 puffs into the lungs every 6 (six) hours as needed for wheezing. 01/30/15   Tanna Furry, MD  atorvastatin  (LIPITOR) 10 MG tablet Take 1 tablet (10 mg total) by mouth daily. 03/30/15   [name], MD  calcium carbonate (TUMS - DOSED IN MG ELEMENTAL CALCIUM) 500 MG chewable tablet Chew 1 tablet by mouth daily. Reported on 11/19/2015    Historical Provider, MD  carvedilol (COREG) 12.5 MG tablet Take 1 tablet (12.5 mg total) by mouth 2 (two) times daily with a meal. 10/07/15   Renella Cunas, MD  cephALEXin (KEFLEX) 500 MG capsule Take 1 capsule (500 mg total) by mouth 4 (four) times daily. 11/19/15   Wandra Arthurs, MD  gabapentin (NEURONTIN) 100 MG capsule TAKE 1 CAPSULE BY MOUTH 3 TIMES DAILY 09/28/15   Lance Bosch, NP  HYDROcodone-acetaminophen (NORCO/VICODIN) 5-325 MG tablet Take 1 tablet by mouth every 6 (six) hours as needed. 11/19/15   Stevi Barrett, PA-C  ibuprofen (ADVIL,MOTRIN) 200 MG tablet Take 400-800 mg by mouth every 4 (four) hours as needed for moderate pain.     Historical Provider, MD  losartan (COZAAR) 100 MG tablet Take 1 tablet (100 mg total) by mouth daily. 09/16/15   Lance Bosch, NP  metFORMIN (GLUCOPHAGE) 1000 MG tablet TAKE 1 TABLET BY MOUTH 2 TIMES DAILY WITH A MEAL. 09/16/15   Lance Bosch, NP  ondansetron (ZOFRAN) 4 MG tablet Take 1 tablet (4 mg total) by mouth every 6 (six) hours. 11/19/15   Stevi Barrett, PA-C  pantoprazole (PROTONIX) 40 MG tablet Take 1 tablet (40 mg total) by mouth daily. 10/07/15   Renella Cunas, MD  PRESCRIPTION MEDICATION Take 0.5 tablets by mouth daily. Patient takes a sleep medication her daughter gives her however patient doesn't know what it is called.    Historical Provider, MD  ranitidine (ZANTAC) 150 MG tablet Take 150 mg by mouth 2 (two) times daily.    Historical Provider, MD   BP 138/73 mmHg  Pulse 53  Temp(Src) 98 F (36.7 C) (Oral)  Resp 16  Ht 5' (1.524 m)  Wt 142 lb 1 oz (64.439 kg)  BMI 27.74 kg/m2  SpO2 92% Physical Exam  Constitutional: She is oriented to person, place, and time. She appears well-developed and well-nourished.  No distress.  Tearful and appears uncomfortable  HENT:  Head: Normocephalic and atraumatic.  Right Ear: External ear normal.  Left Ear: External ear normal.  Nose: Nose normal.  Mouth/Throat: Oropharynx is clear and moist. No oropharyngeal exudate.  Eyes: EOM are normal. Pupils are equal, round, and reactive to light.  Neck: Normal range of motion. Neck supple.  Cardiovascular: Normal rate, regular rhythm, normal heart sounds and intact distal pulses.   No murmur heard. Pulmonary/Chest: Effort normal. No respiratory distress. She has no wheezes. She has no rales.  Abdominal: Soft. She exhibits no distension. There is tenderness in the right upper quadrant and epigastric area. There is no rigidity, no rebound and no guarding.  Musculoskeletal: Normal range of motion. She exhibits no edema or tenderness.  Neurological: She is alert and oriented to person, place, and time.  Skin: Skin is warm and dry. No  rash noted. She is not diaphoretic.  Vitals reviewed.   ED Course  Procedures (including critical care time) Labs Review Labs Reviewed  COMPREHENSIVE METABOLIC PANEL - Abnormal; Notable for the following:    Glucose, Bld 158 (*)    All other components within normal limits  LIPASE, BLOOD  CBC  URINALYSIS, ROUTINE W REFLEX MICROSCOPIC (NOT AT [facility])    Imaging Review No results found. I have personally reviewed and evaluated these images and lab results as part of my medical decision-making.   EKG Interpretation None      MDM  Patient was seen and evaluated in stable condition. Patient with right upper quadrant/epigastric tenderness on examination. Results from previous presentation reviewed. Laboratory studies unremarkable. Patient appears uncomfortable but is not toxic in appearance. She was given Dilaudid and Zofran for symptom control. Limited ultrasound for reevaluation of the gallbladder ordered. Case signed out pending ultrasound results. If pain controlled and ultrasound  without acute finding other than cholelithiasis patient may be discharged on oral medications if pain uncontrolled or if acute finding on ultrasound patient will need a emergent surgical consult. Final diagnoses:  None    1. Abdominal pain  2. Vomiting    Harvel Quale, MD 12/09/15 [phone]

## 2015-12-09 NOTE — Anesthesia Procedure Notes (Signed)
Procedure Name: Intubation Date/Time: 12/09/2015 10:31 AM Performed by: Salli Quarry Sufyan Meidinger Pre-anesthesia Checklist: Patient identified, Emergency Drugs available, Suction available and Patient being monitored Patient Re-evaluated:Patient Re-evaluated prior to inductionOxygen Delivery Method: Circle system utilized Preoxygenation: Pre-oxygenation with 100% oxygen Intubation Type: IV induction, Rapid sequence and Cricoid Pressure applied Laryngoscope Size: Mac and 3 Grade View: Grade I Tube type: Oral Tube size: 7.0 mm Number of attempts: 1 Airway Equipment and Method: Stylet Placement Confirmation: ETT inserted through vocal cords under direct vision,  positive ETCO2 and breath sounds checked- equal and bilateral Secured at: 21 cm Tube secured with: Tape Dental Injury: Teeth and Oropharynx as per pre-operative assessment

## 2015-12-09 NOTE — Progress Notes (Signed)
  Subjective: Pt and family declined telephone interpreter Chart reviewed. Reports abd pain, upper, right going to back with nausea, occasional emesis; worse after meals.   Objective: Vital signs in last 24 hours: Temp:  [98 F (36.7 C)-98.1 F (36.7 C)] 98.1 F (36.7 C) (01/18 0420) Pulse Rate:  [51-59] 55 (01/18 0420) Resp:  [16] 16 (01/18 0420) BP: (135-156)/(62-86) 144/72 mmHg (01/18 0420) SpO2:  [91 %-98 %] 98 % (01/18 0420) Weight:  [64.439 kg (142 lb 1 oz)] 64.439 kg (142 lb 1 oz) (01/17 1942) Last BM Date:  (pta)  Intake/Output from previous day:   Intake/Output this shift:    Alert, nad, cta Reg Soft, nd, nontender No edema  Lab Results:   Recent Labs  12/08/15 2027  WBC 7.4  HGB 13.5  HCT 39.9  PLT 167   BMET  Recent Labs  12/08/15 2027  NA 141  K 3.9  CL 103  CO2 26  GLUCOSE 158*  BUN 11  CREATININE 0.77  CALCIUM 9.7   PT/INR No results for input(s): LABPROT, INR in the last 72 hours. ABG No results for input(s): PHART, HCO3 in the last 72 hours.  Invalid input(s): PCO2, PO2  Studies/Results: US Abdomen Limited  12/09/2015  CLINICAL DATA:  60 year old female with abdominal pain, nausea and vomiting. EXAM: US ABDOMEN LIMITED - RIGHT UPPER QUADRANT COMPARISON:  None. FINDINGS: Gallbladder: There are small stones and sludge within the gallbladder. No gallbladder wall thickening or pericholecystic fluid. Negative sonographic Murphy's sign. Common bile duct: Diameter: 4 mm Liver: Increased hepatic echotexture with mild parenchymal coarsening compatible with known cirrhosis. IMPRESSION: Cholelithiasis without sonographic evidence of acute cholecystitis. A hepatobiliary scintigraphy may provide better evaluation of the gallbladder if an acute cholecystitis is clinically suspected. Cirrhosis. Electronically Signed   By: Anner Crete M.D.   On: 12/09/2015 02:51    Anti-infectives: Anti-infectives    Start     Dose/Rate Route Frequency Ordered  Stop   12/09/15 0845  cefoTEtan (CEFOTAN) 2 g in dextrose 5 % 50 mL IVPB     2 g 100 mL/hr over 30 Minutes Intravenous  Once 12/09/15 [id number]        Assessment/Plan: Symptomatic cholelithiasis Dm htn Breast cancer CAD  Failed outpt mgmt of gallbladder symptoms Symptoms suggestive of gallbladder. Discussed with pt and son that about 10-15% chance cholecystectomy may not ameliorate pain.   I believe the patient's symptoms are consistent with gallbladder disease.  We discussed gallbladder disease. The patient was given educational material in [place] and [place]. We discussed non-operative and operative management. We discussed the signs & symptoms of acute cholecystitis  I discussed laparoscopic cholecystectomy with IOC in detail.  The patient was given educational material as well as diagrams detailing the procedure.  We discussed the risks and benefits of a laparoscopic cholecystectomy including, but not limited to bleeding, infection, injury to surrounding structures such as the intestine or liver, bile leak, retained gallstones, need to convert to an open procedure, prolonged diarrhea, blood clots such as  DVT, common bile duct injury, anesthesia risks, and possible need for additional procedures.  We discussed the typical post-operative recovery course. I explained that the likelihood of improvement of their symptoms is fair -good.  Pt has elected to proceed with surgery Iv abx on call scds  Leighton Ruff. Redmond Pulling, MD, FACS General, Bariatric, & Minimally Invasive Surgery [facility], [place]   [facility] Jerilynn Mages 12/09/2015

## 2015-12-09 NOTE — Op Note (Signed)
Brandi Bryant [id number] 1956-11-06 12/09/2015  Laparoscopic Cholecystectomy with IOC Procedure Note  Indications: This patient presents with symptomatic gallbladder disease and will undergo laparoscopic cholecystectomy.  Pre-operative Diagnosis: symptomatic cholelithiasis  Post-operative Diagnosis: symptomatic cholelithiasis, cirrhotic appearing liver, probable common bile duct stones  Surgeon: Gayland Curry   Assistants: none  Anesthesia: General endotracheal anesthesia  Procedure Details  The patient was seen again in the Holding Room. The risks, benefits, complications, treatment options, and expected outcomes were discussed with the patient. The possibilities of reaction to medication, pulmonary aspiration, perforation of viscus, bleeding, recurrent infection, finding a normal gallbladder, the need for additional procedures, failure to diagnose a condition, the possible need to convert to an open procedure, and creating a complication requiring transfusion or operation were discussed with the patient. The likelihood of improving the patient's symptoms with return to their baseline status is good.  The patient and/or family concurred with the proposed plan, giving informed consent. The site of surgery properly noted. The patient was taken to Operating Room, identified as Brandi Bryant and the procedure verified as Laparoscopic Cholecystectomy with Intraoperative Cholangiogram. A Time Out was held and the above information confirmed. Antibiotic prophylaxis was administered.   Prior to the induction of general anesthesia, antibiotic prophylaxis was administered. General endotracheal anesthesia was then administered and tolerated well. After the induction, the abdomen was prepped with Chloraprep and draped in the sterile fashion. The patient was positioned in the supine position.  Local anesthetic agent was injected into the skin near the umbilicus and an incision made. We  dissected down to the abdominal fascia with blunt dissection.  The fascia was incised vertically and we entered the peritoneal cavity bluntly.  A pursestring suture of 0-Vicryl was placed around the fascial opening.  The Hasson cannula was inserted and secured with the stay suture.  Pneumoperitoneum was then created with CO2 and tolerated well without any adverse changes in the patient's vital signs. An 5-mm port was placed in the subxiphoid position.  Two 5-mm ports were placed in the right upper quadrant. All skin incisions were infiltrated with a local anesthetic agent before making the incision and placing the trocars.   We positioned the patient in reverse Trendelenburg, tilted slightly to the patient's left.  The gallbladder was identified, the fundus grasped and retracted cephalad. Adhesions were lysed bluntly and with the electrocautery where indicated, taking care not to injure any adjacent organs or viscus. The infundibulum was grasped and retracted laterally, exposing the peritoneum overlying the triangle of Calot. This was then divided and exposed in a blunt fashion. A critical view of the cystic duct and cystic artery was obtained. She had a dilated cystic duct. Her common bile duct also looked a little bit prominent as well. A clip was placed across a side branch of the cystic artery both proximal and distally and then transected with EndoShears. I had a clear view of the infundibulum and cystic duct. There is an excellent critical view. There is nothing else entering the gallbladder other than the cystic artery. The cystic duct was clearly identified and bluntly dissected circumferentially. The cystic duct was ligated with a clip distally.   An incision was made in the cystic duct and the Brandi Bryant cholangiogram catheter introduced. The ductotomy was in the distal infundibulum of the gallbladder. The catheter was secured using a clip. A cholangiogram was then obtained which showed visualization of the  gallbladder with some spillage of contrast. The common bile duct was visualized as well. On the initial  run there was evidence of a possible mid common bile duct stone since the contrast abruptly stopped flowing. An additional cholangiogram was done which showed that there was some passage of contrast through the remaining part of the bile duct into the small intestine. The milligram of glucagon was administered and waited 2 minutes and additional cholangiogram was performed she demonstrated free passage of contrast into the small bowel. I never really got good visualization of the proximal biliary tree.   When I removed the clip on the cholangiogram catheter there was backflow of contrast fluid from the cystic duct infundibulum along with small stone debris. It was obvious that there was a stone a little bit further down in the cystic duct near the confluence of the cystic duct and common bile duct. I tried milking it back but was unable to deliver it. Due to the width of the cystic duct and infundibulum I placed a PDS Endoloop around the cystic duct stump. There is also a clip that was almost completely across the cystic duct infundibulum stump as well. The Endoloop was more proximal on the biliary aspect of the cystic duct stump.  The cystic artery which had been identified and dissected free was ligated with clips and divided as well.   The gallbladder was dissected from the liver bed in retrograde fashion with the electrocautery. The gallbladder was removed and placed in an Ecco sac.  The gallbladder and Ecco sac were then removed through the umbilical port site. The liver bed was irrigated and inspected. Hemostasis was achieved with the electrocautery. Copious irrigation was utilized and was repeatedly aspirated until clear.  There is no residual gallstones in the gallbladder fossa or in the hepatorenal space.The pursestring suture was used to close the umbilical fascia.    We again inspected the right  upper quadrant for hemostasis.  The umbilical closure was inspected and there was no air leak and nothing trapped within the closure. Pneumoperitoneum was released as we removed the trocars.  4-0 Monocryl was used to close the skin.  Dermabond was applied. The patient was then extubated and brought to the recovery room in stable condition. Instrument, sponge, and needle counts were correct at closure and at the conclusion of the case.   Findings: +critical view; cirrhotic appearing liver, probable CBD stone  Estimated Blood Loss: Minimal         Drains: none         Specimens: Gallbladder           Complications: None; patient tolerated the procedure well.         Disposition: PACU - hemodynamically stable.         Condition: stable  Leighton Ruff. Redmond Pulling, MD, FACS General, Bariatric, & Minimally Invasive Surgery [facility], [place]

## 2015-12-09 NOTE — ED Notes (Signed)
Jeff PA at bedside.  

## 2015-12-09 NOTE — H&P (Signed)
Brandi Bryant is an 60 y.o. female.   Chief Complaint: epigastric abdominal pain HPI: this is a pleasant 60 year old Hispanic female who presents with epigastric abdominal pain, nausea, and vomiting. This is her second trip to the emergency department the last 2 weeks. The pain radiates through to the back and her left side. She describes it as sharp. She saw her cardiologist November who thought intermittent chest pain she has been having  was not cardiac related. She had a fairly unremarkable heart catheter in 2014. She denies fevers or chills. There is no hematemesis. She has had an ultrasound showing gallstones with normal liver function tests. She denies shortness of breath.  Past Medical History  Diagnosis Date  . Breast cancer ([place])   . HTN (hypertension)   . Diabetes mellitus   . Cholelithiasis   . Hepatic steatosis   . Abdominal pain   . History of breast cancer 06/01/2012  . Arthralgia 08/13/2013  . Chest pain 09/30/2013  . Stroke [facility]) 2006  . Bell's palsy   . Coronary artery disease   . Colon cancer screening 10/27/2015  . Encounter for screening colonoscopy 10/30/2015    Past Surgical History  Procedure Laterality Date  . Tubal ligation    . Breast lumpectomy  2010  . Left heart catheterization with coronary angiogram N/A 10/30/2013    Procedure: LEFT HEART CATHETERIZATION WITH CORONARY ANGIOGRAM;  Surgeon: Josue Hector, MD;  Location: [facility] CATH LAB;  Service: Cardiovascular;  Laterality: N/A;    Family History  Problem Relation Age of Onset  . Thyroid cancer Sister   . Hypertension Mother   . Diabetes Mother   . Migraines Daughter    Social History:  reports that she has never smoked. She has never used smokeless tobacco. She reports that she does not drink alcohol or use illicit drugs.  Allergies:  Allergies  Allergen Reactions  . Gadolinium Derivatives     Code: VOM, Desc: Pt began vomiting 45 sec after MRI contrast injection of Multihance, Onset Date:  [id number]      Facility-administered medications prior to admission  Medication Dose Route Frequency Provider Last Rate Last Dose  . aspirin tablet 325 mg  325 mg Oral Daily Lance Bosch, NP   325 mg at 09/02/15 1628   Medications Prior to Admission  Medication Sig Dispense Refill  . albuterol (PROVENTIL HFA;VENTOLIN HFA) 108 (90 BASE) MCG/ACT inhaler Inhale 1-2 puffs into the lungs every 6 (six) hours as needed for wheezing. 1 Inhaler 0  . atorvastatin (LIPITOR) 10 MG tablet Take 1 tablet (10 mg total) by mouth daily. 90 tablet 10  . calcium carbonate (TUMS - DOSED IN MG ELEMENTAL CALCIUM) 500 MG chewable tablet Chew 1 tablet by mouth daily. Reported on 11/19/2015    . cephALEXin (KEFLEX) 500 MG capsule Take 1 capsule (500 mg total) by mouth 4 (four) times daily. 20 capsule 0  . gabapentin (NEURONTIN) 100 MG capsule TAKE 1 CAPSULE BY MOUTH 3 TIMES DAILY 90 capsule 3  . HYDROcodone-acetaminophen (NORCO/VICODIN) 5-325 MG tablet Take 1 tablet by mouth every 6 (six) hours as needed. 8 tablet 0  . ibuprofen (ADVIL,MOTRIN) 200 MG tablet Take 400-800 mg by mouth every 4 (four) hours as needed for moderate pain.     Marland Kitchen losartan (COZAAR) 100 MG tablet Take 1 tablet (100 mg total) by mouth daily. 30 tablet 4  . metFORMIN (GLUCOPHAGE) 1000 MG tablet TAKE 1 TABLET BY MOUTH 2 TIMES DAILY WITH A MEAL. [place]  tablet 4  . carvedilol (COREG) 12.5 MG tablet Take 1 tablet (12.5 mg total) by mouth 2 (two) times daily with a meal. 60 tablet 6  . ondansetron (ZOFRAN) 4 MG tablet Take 1 tablet (4 mg total) by mouth every 6 (six) hours. 12 tablet 0  . pantoprazole (PROTONIX) 40 MG tablet Take 1 tablet (40 mg total) by mouth daily. 30 tablet 3  . PRESCRIPTION MEDICATION Take 0.5 tablets by mouth daily. Patient takes a sleep medication her daughter gives her however patient doesn't know what it is called.    . ranitidine (ZANTAC) 150 MG tablet Take 150 mg by mouth 2 (two) times daily.      Results for orders placed or  performed during the hospital encounter of 12/08/15 (from the past 48 hour(s))  Urinalysis, Routine w reflex microscopic (not at [facility])     Status: None   Collection Time: 12/08/15  8:13 PM  Result Value Ref Range   Color, Urine YELLOW YELLOW   APPearance CLEAR CLEAR   Specific Gravity, Urine 1.012 1.005 - 1.030   pH 7.0 5.0 - 8.0   Glucose, UA NEGATIVE NEGATIVE mg/dL   Hgb urine dipstick NEGATIVE NEGATIVE   Bilirubin Urine NEGATIVE NEGATIVE   Ketones, ur NEGATIVE NEGATIVE mg/dL   Protein, ur NEGATIVE NEGATIVE mg/dL   Nitrite NEGATIVE NEGATIVE   Leukocytes, UA NEGATIVE NEGATIVE    Comment: MICROSCOPIC NOT DONE ON URINES WITH NEGATIVE PROTEIN, BLOOD, LEUKOCYTES, NITRITE, OR GLUCOSE <1000 mg/dL.  Lipase, blood     Status: None   Collection Time: 12/08/15  8:27 PM  Result Value Ref Range   Lipase 33 11 - 51 U/L  Comprehensive metabolic panel     Status: Abnormal   Collection Time: 12/08/15  8:27 PM  Result Value Ref Range   Sodium 141 135 - 145 mmol/L   Potassium 3.9 3.5 - 5.1 mmol/L   Chloride 103 101 - 111 mmol/L   CO2 26 22 - 32 mmol/L   Glucose, Bld 158 (H) 65 - 99 mg/dL   BUN 11 6 - 20 mg/dL   Creatinine, Ser 0.77 0.44 - 1.00 mg/dL   Calcium 9.7 8.9 - 10.3 mg/dL   Total Protein 6.5 6.5 - 8.1 g/dL   Albumin 3.6 3.5 - 5.0 g/dL   AST 31 15 - 41 U/L   ALT 23 14 - 54 U/L   Alkaline Phosphatase 89 38 - 126 U/L   Total Bilirubin 0.5 0.3 - 1.2 mg/dL   GFR calc non Af Amer >60 >60 mL/min   GFR calc Af Amer >60 >60 mL/min    Comment: (NOTE) The eGFR has been calculated using the CKD EPI equation. This calculation has not been validated in all clinical situations. eGFR's persistently <60 mL/min signify possible Chronic Kidney Disease.    Anion gap 12 5 - 15  CBC     Status: None   Collection Time: 12/08/15  8:27 PM  Result Value Ref Range   WBC 7.4 4.0 - 10.5 K/uL   RBC 4.57 3.87 - 5.11 MIL/uL   Hemoglobin 13.5 12.0 - 15.0 g/dL   HCT 39.9 36.0 - 46.0 %   MCV 87.3 78.0 -  100.0 fL   MCH 29.5 26.0 - 34.0 pg   MCHC 33.8 30.0 - 36.0 g/dL   RDW 12.9 11.5 - 15.5 %   Platelets 167 150 - 400 K/uL   US Abdomen Limited  12/09/2015  CLINICAL DATA:  60 year old female with abdominal pain, nausea and  vomiting. EXAM: US ABDOMEN LIMITED - RIGHT UPPER QUADRANT COMPARISON:  None. FINDINGS: Gallbladder: There are small stones and sludge within the gallbladder. No gallbladder wall thickening or pericholecystic fluid. Negative sonographic Murphy's sign. Common bile duct: Diameter: 4 mm Liver: Increased hepatic echotexture with mild parenchymal coarsening compatible with known cirrhosis. IMPRESSION: Cholelithiasis without sonographic evidence of acute cholecystitis. A hepatobiliary scintigraphy may provide better evaluation of the gallbladder if an acute cholecystitis is clinically suspected. Cirrhosis. Electronically Signed   By: Anner Crete M.D.   On: 12/09/2015 02:51    Review of Systems  All other systems reviewed and are negative.   Blood pressure 143/86, pulse 57, temperature 98 F (36.7 C), temperature source Oral, resp. rate 16, height 5' (1.524 m), weight 64.439 kg (142 lb 1 oz), SpO2 95 %. Physical Exam  Constitutional: She is oriented to person, place, and time. She appears well-developed and well-nourished. No distress.  HENT:  Head: Normocephalic and atraumatic.  Right Ear: External ear normal.  Left Ear: External ear normal.  Nose: Nose normal.  Mouth/Throat: Oropharynx is clear and moist. No oropharyngeal exudate.  Eyes: Pupils are equal, round, and reactive to light. Right eye exhibits no discharge. Left eye exhibits no discharge. No scleral icterus.  Neck: Normal range of motion. No tracheal deviation present. No thyromegaly present.  Cardiovascular: Normal rate, regular rhythm, normal heart sounds and intact distal pulses.   No murmur heard. Respiratory: Effort normal and breath sounds normal. No respiratory distress.  GI: Soft. Bowel sounds are  normal. She exhibits no distension. There is tenderness. There is no guarding.  There is currently minimal epigastric and right upper quadrant tenderness  Musculoskeletal: Normal range of motion. She exhibits no edema or tenderness.  Neurological: She is alert and oriented to person, place, and time.  Skin: Skin is warm and dry. No erythema.  Psychiatric: Her behavior is normal. Judgment normal.     Assessment/Plan Symptomatically cholelithiasis  Gallstones may be the source of her atypical chest pain. Nonetheless, she has had 2 trips to the emergency department the last 2 weeks. She will be admitted for possible laparoscopic cholecystectomy during this admission. Have ordered an EKG. Cardiology may or may not have to be involved preoperatively. I briefly discussed the surgery with her through her family. She will stop family contact is her interpreter rather than the phone service  Kharis Lapenna A 12/09/2015, 5:58 AM

## 2015-12-09 NOTE — Progress Notes (Signed)
MRI called to verify that pt is scheduled 1st in the am per MD request. Arthor Captain LPN

## 2015-12-09 NOTE — Consult Note (Addendum)
Unassigned patient Reason for Consult: Possible CBD stone. Referring Physician: Dr. Greer Pickerel from [facility].  Brandi Bryant is an 60 y.o. female.  HPI: Brandi Bryant, done by Dr. Greer Pickerel today a 60 year old Hispanic female, with multiple medical problems listed below, presented to the ER with a history of RUQ pain and nausea, worse post-prandially for the last 4-6 weeks. She underwent a laparoscopic cholecystectomy with an intraoperative cholangiogram [for symptomatic cholelithiasis] done by Dr. Greer Pickerel today.  As per my discussion with the Creig Hines, Dr. Dois Davenport PA, on the intraoperative cholangiogram, there was suspicion of a CBD stone close to the cystic duct. In spite of an effort made by Dr. Redmond Pulling to milk the stone out he was unable to do so and therefore GI consultation GI consultation was requested for possible ERCP. Patient has had some nausea  with pain in her shoulders. She denies any other GI symptoms at this time.. The patient does not speak English and therefore the history was procured from her daughter who interpreted for her..  Past Medical History  Diagnosis Date  . Breast cancer ([place])   . HTN (hypertension)   . Diabetes mellitus   . Cholelithiasis   . Hepatic steatosis   . Abdominal pain   . History of breast cancer 06/01/2012  . Arthralgia 08/13/2013  . Chest pain 09/30/2013  . Stroke [facility]) 2006  . Bell's palsy   . Coronary artery disease   . Colon cancer screening 10/27/2015  . Encounter for screening colonoscopy 10/30/2015   Past Surgical History  Procedure Laterality Date  . Tubal ligation    . Breast lumpectomy  2010  . Left heart catheterization with coronary angiogram N/A 10/30/2013    Procedure: LEFT HEART CATHETERIZATION WITH CORONARY ANGIOGRAM;  Surgeon: Josue Hector, MD;  Location: [facility] CATH LAB;  Service: Cardiovascular;  Laterality: N/A;   Family History  Problem Relation Age of Onset  . Thyroid cancer  Sister   . Hypertension Mother   . Diabetes Mother   . Migraines Daughter    Social History:  reports that she has never smoked. She has never used smokeless tobacco. She reports that she does not drink alcohol or use illicit drugs.  Allergies:  Allergies  Allergen Reactions  . Gadolinium Derivatives     Code: VOM, Desc: Pt began vomiting 45 sec after MRI contrast injection of Multihance, Onset Date: [id number]     Medications: I have reviewed the patient's current medications.  Results for orders placed or performed during the hospital encounter of 12/08/15 (from the past 48 hour(s))  Urinalysis, Routine w reflex microscopic (not at [facility])     Status: None   Collection Time: 12/08/15  8:13 PM  Result Value Ref Range   Color, Urine YELLOW YELLOW   APPearance CLEAR CLEAR   Specific Gravity, Urine 1.012 1.005 - 1.030   pH 7.0 5.0 - 8.0   Glucose, UA NEGATIVE NEGATIVE mg/dL   Hgb urine dipstick NEGATIVE NEGATIVE   Bilirubin Urine NEGATIVE NEGATIVE   Ketones, ur NEGATIVE NEGATIVE mg/dL   Protein, ur NEGATIVE NEGATIVE mg/dL   Nitrite NEGATIVE NEGATIVE   Leukocytes, UA NEGATIVE NEGATIVE    Comment: MICROSCOPIC NOT DONE ON URINES WITH NEGATIVE PROTEIN, BLOOD, LEUKOCYTES, NITRITE, OR GLUCOSE <1000 mg/dL.  Lipase, blood     Status: None   Collection Time: 12/08/15  8:27 PM  Result Value Ref Range   Lipase 33 11 - 51 U/L  Comprehensive metabolic panel  Status: Abnormal   Collection Time: 12/08/15  8:27 PM  Result Value Ref Range   Sodium 141 135 - 145 mmol/L   Potassium 3.9 3.5 - 5.1 mmol/L   Chloride 103 101 - 111 mmol/L   CO2 26 22 - 32 mmol/L   Glucose, Bld 158 (H) 65 - 99 mg/dL   BUN 11 6 - 20 mg/dL   Creatinine, Ser 0.77 0.44 - 1.00 mg/dL   Calcium 9.7 8.9 - 10.3 mg/dL   Total Protein 6.5 6.5 - 8.1 g/dL   Albumin 3.6 3.5 - 5.0 g/dL   AST 31 15 - 41 U/L   ALT 23 14 - 54 U/L   Alkaline Phosphatase 89 38 - 126 U/L   Total Bilirubin 0.5 0.3 - 1.2 mg/dL   GFR calc non Af  Amer >60 >60 mL/min   GFR calc Af Amer >60 >60 mL/min    Comment: (NOTE) The eGFR has been calculated using the CKD EPI equation. This calculation has not been validated in all clinical situations. eGFR's persistently <60 mL/min signify possible Chronic Kidney Disease.    Anion gap 12 5 - 15  CBC     Status: None   Collection Time: 12/08/15  8:27 PM  Result Value Ref Range   WBC 7.4 4.0 - 10.5 K/uL   RBC 4.57 3.87 - 5.11 MIL/uL   Hemoglobin 13.5 12.0 - 15.0 g/dL   HCT 39.9 36.0 - 46.0 %   MCV 87.3 78.0 - 100.0 fL   MCH 29.5 26.0 - 34.0 pg   MCHC 33.8 30.0 - 36.0 g/dL   RDW 12.9 11.5 - 15.5 %   Platelets 167 150 - 400 K/uL  MRSA PCR Screening     Status: None   Collection Time: 12/09/15  9:14 AM  Result Value Ref Range   MRSA by PCR NEGATIVE NEGATIVE    Comment:        The GeneXpert MRSA Assay (FDA approved for NASAL specimens only), is one component of a comprehensive MRSA colonization surveillance program. It is not intended to diagnose MRSA infection nor to guide or monitor treatment for MRSA infections.   Glucose, capillary     Status: Abnormal   Collection Time: 12/09/15  9:32 AM  Result Value Ref Range   Glucose-Capillary 131 (H) 65 - 99 mg/dL  Glucose, capillary     Status: Abnormal   Collection Time: 12/09/15 12:17 PM  Result Value Ref Range   Glucose-Capillary 250 (H) 65 - 99 mg/dL   Comment 1 Notify RN    Comment 2 Document in Chart   Glucose, capillary     Status: Abnormal   Collection Time: 12/09/15  1:15 PM  Result Value Ref Range   Glucose-Capillary 161 (H) 65 - 99 mg/dL   Dg Cholangiogram Operative  12/09/2015  CLINICAL DATA:  60 year old female with a history of cholelithiasis. EXAM: INTRAOPERATIVE CHOLANGIOGRAM TECHNIQUE: Cholangiographic images from the C-arm fluoroscopic device were submitted for interpretation post-operatively. Please see the procedural report for the amount of contrast and the fluoroscopy time utilized. COMPARISON:  Ultrasound  12/09/2015, CT 11/19/2015 FINDINGS: Surgical instruments project over the upper abdomen. There is cannulation of the cystic duct/gallbladder neck, with antegrade infusion of contrast. Caliber of the extrahepatic ductal system within normal limits. No large filling defect identified. Small amount of extraluminal contrast per Free flow of contrast across the ampulla. IMPRESSION: Intraoperative cholangiogram demonstrates extrahepatic biliary ducts of unremarkable caliber, with no large filling defect identified. Free flow of contrast across  the ampulla. Please refer to the dictated operative report for full details of intraoperative findings and procedure Signed, Dulcy Fanny. Earleen Newport, DO [facility] Radiology Electronically Signed   By: Corrie Mckusick D.O.   On: 12/09/2015 12:48   US Abdomen Limited  12/09/2015  CLINICAL DATA:  60 year old female with abdominal pain, nausea and vomiting. EXAM: US ABDOMEN LIMITED - RIGHT UPPER QUADRANT COMPARISON:  None. FINDINGS: Gallbladder: There are small stones and sludge within the gallbladder. No gallbladder wall thickening or pericholecystic fluid. Negative sonographic Murphy's sign. Common bile duct: Diameter: 4 mm Liver: Increased hepatic echotexture with mild parenchymal coarsening compatible with known cirrhosis. IMPRESSION: Cholelithiasis without sonographic evidence of acute cholecystitis. A hepatobiliary scintigraphy may provide better evaluation of the gallbladder if an acute cholecystitis is clinically suspected. Cirrhosis. Electronically Signed   By: Anner Crete M.D.   On: 12/09/2015 02:51   Review of Systems  Constitutional: Negative.   HENT: Negative.   Eyes: Negative.   Respiratory: Negative.   Cardiovascular: Positive for chest pain. Negative for palpitations, orthopnea, claudication, leg swelling and PND.  Gastrointestinal: Positive for nausea and abdominal pain. Negative for heartburn, vomiting, diarrhea,  constipation, blood in stool and melena.  Genitourinary: Negative.   Musculoskeletal: Negative.   Skin: Negative.   Neurological: Negative.   Endo/Heme/Allergies: Negative.   Psychiatric/Behavioral: Negative.    Blood pressure 136/63, pulse 61, temperature 98.5 F (36.9 C), temperature source Oral, resp. rate 14, height 5' (1.524 m), weight 64.439 kg (142 lb 1 oz), SpO2 100 %. Physical Exam  Constitutional: She is oriented to person, place, and time. She appears well-developed and well-nourished.  HENT:  Head: Normocephalic and atraumatic.  Eyes: Conjunctivae and EOM are normal. Pupils are equal, round, and reactive to light.  Neck: Normal range of motion. Neck supple.  Cardiovascular: Normal rate and regular rhythm.   Respiratory: Effort normal and breath sounds normal.  GI: Soft. Bowel sounds are normal. She exhibits no distension and no mass. There is tenderness. There is guarding. There is no rebound.  Musculoskeletal: Normal range of motion.  Neurological: She is alert and oriented to person, place, and time.  Skin: Skin is warm and dry.  Psychiatric: She has a normal mood and affect. Her behavior is normal. Judgment and thought content normal.   Assessment/Plan: 1) Common bile duct stones noted on IOC after laparoscopic cholecystectomy done for cholelithiasis.: We'll proceed with an MRCP that will be done tomorrow and further recommendations will regard to an ERCP will be made thereafter.  2) Cirrhotic appearing liver/hepatic steatosis: She will need further workup with advanced face on an outpatient basis. 3) Hypertension/Hyperlipidemia/CAD.  4) AODM.  5) Personal history of breast cancer. Ivannah Zody 12/09/2015, 4:18 PM

## 2015-12-09 NOTE — Transfer of Care (Signed)
Immediate Anesthesia Transfer of Care Note  Patient: Brandi Bryant  Procedure(s) Performed: Procedure(s): LAPAROSCOPIC CHOLECYSTECTOMY WITH INTRAOPERATIVE CHOLANGIOGRAM (N/A)  Patient Location: PACU  Anesthesia Type:General  Level of Consciousness: awake, alert , oriented and patient cooperative  Airway & Oxygen Therapy: Patient Spontanous Breathing and Patient connected to nasal cannula oxygen  Post-op Assessment: Report given to RN and Post -op Vital signs reviewed and stable  Post vital signs: Reviewed and stable  Last Vitals:  Filed Vitals:   12/09/15 0853 12/09/15 1215  BP: 149/79 143/67  Pulse: 65   Temp: 37 C 37.2 C  Resp: 18     Complications: No apparent anesthesia complications

## 2015-12-09 NOTE — ED Provider Notes (Signed)
60 YOF signed out at shift change to me by Lonia Skinner MD pending ultrasound and re-evaluation. Please see previous providers not for full H&P. Pt here for symptomatic cholelithiasis as evidenced by ultrasound and RUQ discomfort. Worse after food, unresolved with medications at home, not tolerating PO and unable to manage pain at home. Pt was seen on 11/19/15 and discharged but was unable to follow-up with surgeon. Due to patients persistent symptoms despite IV pain medication here surgery consulted for intervention. Pt's lab so no signs of obstruction or infectious etiology. Pt stable in the ED. Dr. [name] with surgery to see the patient.    Okey Regal, PA-C 12/09/15 1536  Harvel Quale, MD 12/16/15 2137

## 2015-12-09 NOTE — Anesthesia Postprocedure Evaluation (Signed)
Anesthesia Post Note  Patient: Dennie Zamora-Quintanilla  Procedure(s) Performed: Procedure(s) (LRB): LAPAROSCOPIC CHOLECYSTECTOMY WITH INTRAOPERATIVE CHOLANGIOGRAM (N/A)  Patient location during evaluation: PACU Anesthesia Type: General Level of consciousness: awake and alert Pain management: pain level controlled Vital Signs Assessment: post-procedure vital signs reviewed and stable Respiratory status: spontaneous breathing, nonlabored ventilation, respiratory function stable and patient connected to nasal cannula oxygen Cardiovascular status: blood pressure returned to baseline and stable Postop Assessment: no signs of nausea or vomiting Anesthetic complications: no    Last Vitals:  Filed Vitals:   12/09/15 1315 12/09/15 1325  BP: 148/74   Pulse: 65 63  Temp:    Resp: 13 11    Last Pain:  Filed Vitals:   12/09/15 1327  PainSc: 5                  Diontre Harps,W. EDMOND

## 2015-12-10 ENCOUNTER — Observation Stay (HOSPITAL_COMMUNITY): Payer: No Typology Code available for payment source

## 2015-12-10 ENCOUNTER — Encounter (HOSPITAL_COMMUNITY): Payer: Self-pay | Admitting: General Surgery

## 2015-12-10 LAB — COMPREHENSIVE METABOLIC PANEL
ALT: 30 U/L (ref 14–54)
ANION GAP: 6 (ref 5–15)
AST: 44 U/L — ABNORMAL HIGH (ref 15–41)
Albumin: 3 g/dL — ABNORMAL LOW (ref 3.5–5.0)
Alkaline Phosphatase: 63 U/L (ref 38–126)
BUN: 15 mg/dL (ref 6–20)
CHLORIDE: 107 mmol/L (ref 101–111)
CO2: 27 mmol/L (ref 22–32)
Calcium: 8.6 mg/dL — ABNORMAL LOW (ref 8.9–10.3)
Creatinine, Ser: 0.85 mg/dL (ref 0.44–1.00)
GFR calc non Af Amer: >60 mL/min (ref >60)
Glucose, Bld: 113 mg/dL — ABNORMAL HIGH (ref 65–99)
POTASSIUM: 3.7 mmol/L (ref 3.5–5.1)
Sodium: 140 mmol/L (ref 135–145)
TOTAL PROTEIN: 5.8 g/dL — AB (ref 6.5–8.1)
Total Bilirubin: 0.9 mg/dL (ref 0.3–1.2)

## 2015-12-10 LAB — CBC
HCT: 35.7 % — ABNORMAL LOW (ref 36.0–46.0)
Hemoglobin: 12.3 g/dL (ref 12.0–15.0)
MCH: 30 pg (ref 26.0–34.0)
MCHC: 34.5 g/dL (ref 30.0–36.0)
MCV: 87.1 fL (ref 78.0–100.0)
PLATELETS: 140 10*3/uL — AB (ref 150–400)
RBC: 4.1 MIL/uL (ref 3.87–5.11)
RDW: 13 % (ref 11.5–15.5)
WBC: 9.9 10*3/uL (ref 4.0–10.5)

## 2015-12-10 LAB — GLUCOSE, CAPILLARY
GLUCOSE-CAPILLARY: 115 mg/dL — AB (ref 65–99)
GLUCOSE-CAPILLARY: 147 mg/dL — AB (ref 65–99)

## 2015-12-10 MED ORDER — IBUPROFEN 200 MG PO TABS: 400.0000 mg | ORAL_TABLET | Freq: Four times a day (QID) | ORAL | Status: DC | PRN

## 2015-12-10 MED ORDER — HEPARIN SODIUM (PORCINE) 5000 UNIT/ML IJ SOLN
5000.0000 [IU] | Freq: Three times a day (TID) | INTRAMUSCULAR | Status: DC
  Administered 2015-12-10 – 2015-12-11 (×3): 5000 [IU] via SUBCUTANEOUS
  Filled 2015-12-10 (×3): qty 1

## 2015-12-10 MED ORDER — GADOBENATE DIMEGLUMINE 529 MG/ML IV SOLN
15.0000 mL | Freq: Once | INTRAVENOUS | Status: AC
  Administered 2015-12-10: 13 mL via INTRAVENOUS

## 2015-12-10 MED ORDER — OXYCODONE-ACETAMINOPHEN 5-325 MG PO TABS
1.0000 | ORAL_TABLET | ORAL | Status: DC | PRN
  Administered 2015-12-10 – 2015-12-11 (×2): 2 via ORAL
  Filled 2015-12-10 (×2): qty 2

## 2015-12-10 MED ORDER — MORPHINE SULFATE (PF) 2 MG/ML IV SOLN
2.0000 mg | INTRAVENOUS | Status: DC | PRN
  Administered 2015-12-11: 2 mg via INTRAVENOUS
  Filled 2015-12-10: qty 1

## 2015-12-10 MED ORDER — FAMOTIDINE 20 MG PO TABS
20.0000 mg | ORAL_TABLET | Freq: Every day | ORAL | Status: DC
  Administered 2015-12-10: 20 mg via ORAL
  Filled 2015-12-10: qty 1

## 2015-12-10 MED ORDER — KETOROLAC TROMETHAMINE 15 MG/ML IJ SOLN
15.0000 mg | Freq: Three times a day (TID) | INTRAMUSCULAR | Status: DC | PRN
Start: 2015-12-10 — End: 2015-12-11
  Administered 2015-12-10 – 2015-12-11 (×2): 15 mg via INTRAVENOUS
  Filled 2015-12-10 (×2): qty 1

## 2015-12-10 NOTE — Discharge Instructions (Signed)
[facility] ______[facility] °LAPAROSCOPIC SURGERY: POST OP INSTRUCTIONS °Always review your discharge instruction sheet given to you by the facility where your surgery was performed. °IF YOU HAVE DISABILITY OR FAMILY LEAVE FORMS, YOU MUST BRING THEM TO THE OFFICE FOR PROCESSING.   °DO NOT GIVE THEM TO YOUR DOCTOR. ° °1. A prescription for pain medication may be given to you upon discharge.  Take your pain medication as prescribed, if needed.  If narcotic pain medicine is not needed, then you may take acetaminophen (Tylenol) or ibuprofen (Advil) as needed. °2. Take your usually prescribed medications unless otherwise directed. °3. If you need a refill on your pain medication, please contact your pharmacy.  They will contact our office to request authorization. Prescriptions will not be filled after 5pm or on week-ends. °4. You should follow a light diet the first few days after arrival home, such as soup and crackers, etc.  Be sure to include lots of fluids daily. °5. Most patients will experience some swelling and bruising in the area of the incisions.  Ice packs will help.  Swelling and bruising can take several days to resolve.  °6. It is common to experience some constipation if taking pain medication after surgery.  Increasing fluid intake and taking a stool softener (such as Colace) will usually help or prevent this problem from occurring.  A mild laxative (Milk of Magnesia or Miralax) should be taken according to package instructions if there are no bowel movements after 48 hours. °7. Unless discharge instructions indicate otherwise, you may remove your bandages 24-48 hours after surgery, and you may shower at that time.  You may have steri-strips (small skin tapes) in place directly over the incision.  These strips should be left on the skin for 7-10 days.  If your surgeon used skin glue on the incision, you may shower in 24 hours.  The glue will flake off over the next 2-3 weeks.  Any sutures or  staples will be removed at the office during your follow-up visit. °8. ACTIVITIES:  You may resume regular (light) daily activities beginning the next day--such as daily self-care, walking, climbing stairs--gradually increasing activities as tolerated.  You may have sexual intercourse when it is comfortable.  Refrain from any heavy lifting or straining until approved by your doctor. °a. You may drive when you are no longer taking prescription pain medication, you can comfortably wear a seatbelt, and you can safely maneuver your car and apply brakes. °b. RETURN TO WORK:  __________________________________________________________ °9. You should see your doctor in the office for a follow-up appointment approximately 2-3 weeks after your surgery.  Make sure that you call for this appointment within a day or two after you arrive home to insure a convenient appointment time. °10. OTHER INSTRUCTIONS: __________________________________________________________________________________________________________________________ __________________________________________________________________________________________________________________________ °WHEN TO CALL YOUR DOCTOR: °1. Fever over 101.0 °2. Inability to urinate °3. Continued bleeding from incision. °4. Increased pain, redness, or drainage from the incision. °5. Increasing abdominal pain ° °The clinic staff is available to answer your questions during regular business hours.  Please don’t hesitate to call and ask to speak to one of the nurses for clinical concerns.  If you have a medical emergency, go to the nearest emergency room or call 911.  A surgeon from [facility] is always on call at the hospital. °[address], [address], [place], [place]  [address] ? [address] [address], [place],    [address] °[phone] ? [phone] ? FAX [phone] °Web site:   [email or web address]  Colecistectoma laparoscpica, cuidados  posteriores (Laparoscopic Cholecystectomy, Care After) Siga estas instrucciones durante las prximas semanas. Estas indicaciones le proporcionan informacin acerca de cmo deber cuidarse despus del procedimiento. El mdico tambin podr darle instrucciones ms especficas. El tratamiento ha sido planificado segn las prcticas mdicas actuales, pero en algunos casos pueden ocurrir problemas. Comunquese con el mdico si tiene algn problema o dudas despus del procedimiento. QU ESPERAR DESPUS DEL PROCEDIMIENTO Despus del procedimiento, es comn tener los siguientes sntomas:  Dolor en los lugares de las incisiones. Le darn analgsicos para Financial controller.  Nuseas o vmitos leves. Estos sntomas deberan mejorar despus de las primeras 24horas.  Meteorismo y Personal assistant en el hombro debido al gas que se [place] durante el procedimiento. Estos sntomas mejorarn despus de las primeras 24horas. INSTRUCCIONES PARA EL CUIDADO EN EL HOGAR Cuidado de la incisin  Siga las indicaciones del mdico acerca del cuidado de las incisiones. Haga lo siguiente:  [employer] con agua y jabn antes de Quarry manager las vendas (vendaje). Use desinfectante para manos si no dispone de [place] y [place].  Cambie el vendaje como se lo haya indicado el mdico.  No retire los puntos (suturas), el [employer] para la piel o las tiras [place]. Es posible que estos deban quedar puestos en la piel durante 2semanas o ms tiempo. Si los bordes de las tiras adhesivas empiezan a despegarse y Therapist, sports, puede recortar los que estn sueltos. No retire las tiras [facility] por completo a menos que el mdico se lo indique.  No tome baos de inmersin, no nade ni use el jacuzzi hasta que el mdico lo autorice. Pregntele al mdico si puede ducharse. Thurston Pounds solo le permitan tomar baos de [place]. Instrucciones generales  [employer] de venta libre y los recetados solamente como se lo haya indicado el  mdico.  No conduzca ni opere maquinaria pesada mientras toma analgsicos recetados.  Reanude su dieta normal como se lo haya indicado el mdico.  No levante ningn objeto que pese ms de 10libras (4,5kg).  No practique deportes de contacto durante unasemana o hasta que el mdico lo autorice. SOLICITE ATENCIN MDICA SI:   Tiene enrojecimiento, hinchazn o Management consultant de la incisin.  Observa lquido, sangre o pus que emanan de la incisin.  Percibe que sale mal olor de la zona de las incisiones.  Las incisiones quirrgicas se abren.  Tiene fiebre. SOLICITE ATENCIN MDICA DE INMEDIATO SI:  Le aparece una erupcin cutnea.  Tiene dificultad para respirar.  Siente dolor en el pecho.  Siente ms dolor en los hombros (en las zonas donde van los breteles).  Se desmaya o tiene episodios de [employer] est de pie.  Siente un dolor intenso en el abdomen.  Tiene nuseas o vmitos durante ms de Optician, dispensing.   Esta informacin no tiene Marine scientist el consejo del mdico. Asegrese de hacerle al mdico cualquier pregunta que tenga.   Document Released: 06/20/2011 Document Revised: 07/29/2015 Elsevier Interactive Patient Education [employer].

## 2015-12-10 NOTE — Discharge Summary (Signed)
Physician Discharge Summary  Patient ID: Brandi Bryant MRN: [id number] DOB/AGE: 08-18-1956 60 y.o.  Admit date: 12/08/2015 Discharge date: 12/11/2015  Admission Diagnoses:  Symptomatic cholelithiasis, cirrhotic appearing liver, probable common bile duct stones AODM Hypertension CAD Hx of Stroke Breast cancer Discharge Diagnoses:  Active Problems:   Symptomatic cholelithiasis   PROCEDURES: S/p laparoscopic cholecystectomy & IOC, 12/09/15, [place] [facility] Course:  this is a pleasant 60 year old Hispanic female who presents with epigastric abdominal pain, nausea, and vomiting. This is her second trip to the emergency department the last 2 weeks. The pain radiates through to the back and her left side. She describes it as sharp. She saw her cardiologist November who thought intermittent chest pain she has been having was not cardiac related. She had a fairly unremarkable heart catheter in 2014. She denies fevers or chills. There is no hematemesis. She has had an ultrasound showing gallstones with normal liver function tests. She denies shortness of breath.  She was diagnosed with symptomatic cholelithiasis and admitted by Dr. [name].  She was taken to the OR the following day and underwent laparoscopic cholecystectomy.  IOC: cholangiogram was then obtained which showed visualization of the gallbladder with some spillage of contrast. The common bile duct was visualized as well. On the initial run there was evidence of a possible mid common bile duct stone since the contrast abruptly stopped flowing. An additional cholangiogram was done which showed that there was some passage of contrast through the remaining part of the bile duct into the small intestine. The milligram of glucagon was administered and waited 2 minutes and additional cholangiogram was performed she demonstrated free passage of contrast into the small bowel. I never really got good visualization of the proximal  biliary tree.  Post op she was seen by GI and Dr. Collene Mares.  MRCP was obtained and reviewed by Dr. Benson Norway and Dr. Redmond Pulling.  The common bile duct was clear, but there is a retained stone int the cystic duct stump.  It was determined that it would be best just to watch and see how she does.  LFT's were normal POD 1.  She will follow up with Dr. Redmond Pulling in about 2 weeks.  Family did all the translations even though interpreter phone was offered at each visit.  Condition on D/c:  Improved     Disposition: 01-Home or Self Care     Medication List    ASK your doctor about these medications        albuterol 108 (90 Base) MCG/ACT inhaler  Commonly known as:  PROVENTIL HFA;VENTOLIN HFA  Inhale 1-2 puffs into the lungs every 6 (six) hours as needed for wheezing.     atorvastatin 10 MG tablet  Commonly known as:  LIPITOR  Take 1 tablet (10 mg total) by mouth daily.     calcium carbonate 500 MG chewable tablet  Commonly known as:  TUMS - dosed in mg elemental calcium  Chew 1 tablet by mouth daily. Reported on 11/19/2015     carvedilol 12.5 MG tablet  Commonly known as:  COREG  Take 1 tablet (12.5 mg total) by mouth 2 (two) times daily with a meal.     gabapentin 100 MG capsule  Commonly known as:  NEURONTIN  TAKE 1 CAPSULE BY MOUTH 3 TIMES DAILY     ibuprofen 200 MG tablet  Commonly known as:  ADVIL,MOTRIN  Take 400-800 mg by mouth every 4 (four) hours as needed for moderate pain.  losartan 100 MG tablet  Commonly known as:  COZAAR  Take 1 tablet (100 mg total) by mouth daily.     metFORMIN 1000 MG tablet  Commonly known as:  GLUCOPHAGE  TAKE 1 TABLET BY MOUTH 2 TIMES DAILY WITH A MEAL.     PRESCRIPTION MEDICATION  Take 0.5 tablets by mouth daily. Patient takes a sleep medication her daughter gives her however patient doesn't know what it is called.     ranitidine 150 MG tablet  Commonly known as:  ZANTAC  Take 150 mg by mouth 2 (two) times daily.           Follow-up  Information    Follow up with Gayland Curry, MD. Schedule an appointment as soon as possible for a visit in 2 weeks.   Specialty:  General Surgery   Contact information:   [place] [address] [phone]       Signed: Earnstine Regal 12/10/2015, 3:30 PM

## 2015-12-10 NOTE — Progress Notes (Signed)
1 Day Post-Op  Subjective: She looks fine, awaiting input and plans for possible ERCP.  She isn't, complaining of much pain, has not been able to eat so far.  Sites look fine.  I took her O2 off and told family to start walking her and getting her up to chair.  I offered to use interpreter phone, and family is happy with translation.  Objective: Vital signs in last 24 hours: Temp:  [98.3 F (36.8 C)-99.3 F (37.4 C)] 99.3 F (37.4 C) (01/19 0833) Pulse Rate:  [61-76] 66 (01/19 0833) Resp:  [11-18] 18 (01/19 0833) BP: (99-164)/(44-74) 109/54 mmHg (01/19 0833) SpO2:  [97 %-100 %] 99 % (01/19 0833) Last BM Date: 12/07/15 NPO 650 urine recorded Afebrile, VSS Labs OK, No elevation of the LFT's, WBC is normal also MRI:  1. Mild fusiform dilatation of the common bile duct to the level of the ampulla. No choledocholithiasis. 2. Retained 5 mm calculus within the cystic duct remnant. 3. Fluid within the gallbladder fossa and along the RIGHT hepatic margin as expected following cholecystectomy 1 day prior. 4. Bosniak 2 hemorrhagic cyst of the LEFT kidney. Intake/Output from previous day: 01/18 0701 - 01/19 0700 In: 1000 [I.V.:1000] Out: 650 [Urine:650] Intake/Output this shift: Total I/O In: 2081.3 [I.V.:2081.3] Out: -   General appearance: alert, cooperative and no distress Resp: clear to auscultation bilaterally GI: soft, sore, sites look fine  Lab Results:   Recent Labs  12/08/15 2027 12/10/15 0634  WBC 7.4 9.9  HGB 13.5 12.3  HCT 39.9 35.7*  PLT 167 140*    BMET  Recent Labs  12/08/15 2027 12/10/15 0634  NA 141 140  K 3.9 3.7  CL 103 107  CO2 26 27  GLUCOSE 158* 113*  BUN 11 15  CREATININE 0.77 0.85  CALCIUM 9.7 8.6*   PT/INR No results for input(s): LABPROT, INR in the last 72 hours.   Recent Labs Lab 12/08/15 2027 12/10/15 0634  AST 31 44*  ALT 23 30  ALKPHOS 89 63  BILITOT 0.5 0.9  PROT 6.5 5.8*  ALBUMIN 3.6 3.0*     Lipase     Component  Value Date/Time   LIPASE 33 12/08/2015 2027     Studies/Results: Dg Cholangiogram Operative  12/09/2015  CLINICAL DATA:  60 year old female with a history of cholelithiasis. EXAM: INTRAOPERATIVE CHOLANGIOGRAM TECHNIQUE: Cholangiographic images from the C-arm fluoroscopic device were submitted for interpretation post-operatively. Please see the procedural report for the amount of contrast and the fluoroscopy time utilized. COMPARISON:  Ultrasound 12/09/2015, CT 11/19/2015 FINDINGS: Surgical instruments project over the upper abdomen. There is cannulation of the cystic duct/gallbladder neck, with antegrade infusion of contrast. Caliber of the extrahepatic ductal system within normal limits. No large filling defect identified. Small amount of extraluminal contrast per Free flow of contrast across the ampulla. IMPRESSION: Intraoperative cholangiogram demonstrates extrahepatic biliary ducts of unremarkable caliber, with no large filling defect identified. Free flow of contrast across the ampulla. Please refer to the dictated operative report for full details of intraoperative findings and procedure Signed, Dulcy Fanny. Earleen Newport, DO [facility] Radiology Electronically Signed   By: Corrie Mckusick D.O.   On: 12/09/2015 12:48   Mr 3d Recon At Scanner  12/10/2015  CLINICAL DATA:  Laparoscopic cholecystectomy on 12/09/2015. Concern for a cystic duct/common bile duct calculus. EXAM: MRI ABDOMEN WITHOUT AND WITH CONTRAST (INCLUDING MRCP) TECHNIQUE: Multiplanar multisequence MR imaging of the abdomen was performed both before and after the administration of intravenous  contrast. Heavily T2-weighted images of the biliary and pancreatic ducts were obtained, and three-dimensional MRCP images were rendered by post processing. CONTRAST:  43mL MULTIHANCE GADOBENATE DIMEGLUMINE 529 MG/ML IV SOLN COMPARISON:  Intraoperative cholangiogram 12/09/2015, ultrasound 12/09/2015, CT 11/19/2015.  FINDINGS: Lower chest:  Lung bases are clear. Hepatobiliary: No intrahepatic biliary duct dilatation. The common bile duct is mildly dilated to 10 mm. There is no filling defect within the common bile duct. No external compression. The duct dilatation extends to the level of the ampulla again without filling defect. There is a a calculus within the cystic duct remnant measuring 5 mm on image 19, series 6. There is small amount of fluid within the gallbladder fossa and in Morrison's pouch related to recent surgery. No evidence of organized fluid collection. No significant intraperitoneal fluid identified beyond the gallbladder fossa. Pancreas: No pancreatic duct dilatation. No pancreatic inflammation. Spleen: Normal spleen. Adrenals/urinary tract: Adrenal glands and kidneys are normal. Nonenhancing hemorrhagic cysts of the LEFT kidney measures 19 mm (image 31, series 17 ; image 42, series 11602). Stomach/Bowel: Stomach and limited of the small bowel is unremarkable Vascular/Lymphatic: Abdominal aortic normal caliber. No retroperitoneal periportal lymphadenopathy. Musculoskeletal: No aggressive osseous lesion IMPRESSION: 1. Mild fusiform dilatation of the common bile duct to the level of the ampulla. No choledocholithiasis. 2. Retained 5 mm calculus within the cystic duct remnant. 3. Fluid within the gallbladder fossa and along the RIGHT hepatic margin as expected following cholecystectomy 1 day prior. 4. Bosniak 2 hemorrhagic cyst of the LEFT kidney. Electronically Signed   By: Suzy Bouchard M.D.   On: 12/10/2015 08:51   US Abdomen Limited  12/09/2015  CLINICAL DATA:  60 year old female with abdominal pain, nausea and vomiting. EXAM: US ABDOMEN LIMITED - RIGHT UPPER QUADRANT COMPARISON:  None. FINDINGS: Gallbladder: There are small stones and sludge within the gallbladder. No gallbladder wall thickening or pericholecystic fluid. Negative sonographic Murphy's sign. Common bile duct: Diameter: 4 mm Liver:  Increased hepatic echotexture with mild parenchymal coarsening compatible with known cirrhosis. IMPRESSION: Cholelithiasis without sonographic evidence of acute cholecystitis. A hepatobiliary scintigraphy may provide better evaluation of the gallbladder if an acute cholecystitis is clinically suspected. Cirrhosis. Electronically Signed   By: Anner Crete M.D.   On: 12/09/2015 02:51   Mr Abd W/wo Cm/mrcp  12/10/2015  CLINICAL DATA:  Laparoscopic cholecystectomy on 12/09/2015. Concern for a cystic duct/common bile duct calculus. EXAM: MRI ABDOMEN WITHOUT AND WITH CONTRAST (INCLUDING MRCP) TECHNIQUE: Multiplanar multisequence MR imaging of the abdomen was performed both before and after the administration of intravenous contrast. Heavily T2-weighted images of the biliary and pancreatic ducts were obtained, and three-dimensional MRCP images were rendered by post processing. CONTRAST:  59mL MULTIHANCE GADOBENATE DIMEGLUMINE 529 MG/ML IV SOLN COMPARISON:  Intraoperative cholangiogram 12/09/2015, ultrasound 12/09/2015, CT 11/19/2015. FINDINGS: Lower chest:  Lung bases are clear. Hepatobiliary: No intrahepatic biliary duct dilatation. The common bile duct is mildly dilated to 10 mm. There is no filling defect within the common bile duct. No external compression. The duct dilatation extends to the level of the ampulla again without filling defect. There is a a calculus within the cystic duct remnant measuring 5 mm on image 19, series 6. There is small amount of fluid within the gallbladder fossa and in Morrison's pouch related to recent surgery. No evidence of organized fluid collection. No significant intraperitoneal fluid identified beyond the gallbladder fossa. Pancreas: No pancreatic duct dilatation. No pancreatic inflammation. Spleen: Normal spleen. Adrenals/urinary tract: Adrenal glands and kidneys are normal. Nonenhancing hemorrhagic  cysts of the LEFT kidney measures 19 mm (image 31, series 17 ; image 42,  series 11602). Stomach/Bowel: Stomach and limited of the small bowel is unremarkable Vascular/Lymphatic: Abdominal aortic normal caliber. No retroperitoneal periportal lymphadenopathy. Musculoskeletal: No aggressive osseous lesion IMPRESSION: 1. Mild fusiform dilatation of the common bile duct to the level of the ampulla. No choledocholithiasis. 2. Retained 5 mm calculus within the cystic duct remnant. 3. Fluid within the gallbladder fossa and along the RIGHT hepatic margin as expected following cholecystectomy 1 day prior. 4. Bosniak 2 hemorrhagic cyst of the LEFT kidney. Electronically Signed   By: Suzy Bouchard M.D.   On: 12/10/2015 08:51    Medications: . carvedilol  12.5 mg Oral BID WC  . gabapentin  100 mg Oral TID  . losartan  100 mg Oral Daily  . pantoprazole (PROTONIX) IV  40 mg Intravenous QHS    Assessment/Plan symptomatic cholelithiasis, cirrhotic appearing liver, probable common bile duct stones S/p laparoscopic cholecystectomy & IOC, 12/09/15, Dr.Eric Redmond Pulling AODM Hypertension CAD Hx of Stroke Breast cancer Antibiotics:  None DVT:  SCD/ add heparin later today    Plan:  I will talk with Dr. Redmond Pulling and await GI recommendations.      Katarzyna Wolven 12/10/2015

## 2015-12-10 NOTE — Progress Notes (Signed)
12/10/15 1121 nursing MD notified of patients bp 101/50-116/60. New orders noted

## 2015-12-10 NOTE — Progress Notes (Signed)
Interpreter Lesle Chris for [employer]

## 2015-12-11 LAB — COMPREHENSIVE METABOLIC PANEL
ALK PHOS: 70 U/L (ref 38–126)
ALT: 30 U/L (ref 14–54)
ANION GAP: 11 (ref 5–15)
AST: 43 U/L — ABNORMAL HIGH (ref 15–41)
Albumin: 3.1 g/dL — ABNORMAL LOW (ref 3.5–5.0)
BILIRUBIN TOTAL: 1 mg/dL (ref 0.3–1.2)
BUN: 13 mg/dL (ref 6–20)
CHLORIDE: 103 mmol/L (ref 101–111)
CO2: 28 mmol/L (ref 22–32)
Calcium: 8.8 mg/dL — ABNORMAL LOW (ref 8.9–10.3)
Creatinine, Ser: 0.83 mg/dL (ref 0.44–1.00)
GFR calc Af Amer: >60 mL/min (ref >60)
Glucose, Bld: 149 mg/dL — ABNORMAL HIGH (ref 65–99)
POTASSIUM: 3.6 mmol/L (ref 3.5–5.1)
SODIUM: 142 mmol/L (ref 135–145)
TOTAL PROTEIN: 6 g/dL — AB (ref 6.5–8.1)

## 2015-12-11 LAB — CBC
HEMATOCRIT: 38.1 % (ref 36.0–46.0)
HEMOGLOBIN: 12.7 g/dL (ref 12.0–15.0)
MCH: 29.7 pg (ref 26.0–34.0)
MCHC: 33.3 g/dL (ref 30.0–36.0)
MCV: 89 fL (ref 78.0–100.0)
Platelets: 121 10*3/uL — ABNORMAL LOW (ref 150–400)
RBC: 4.28 MIL/uL (ref 3.87–5.11)
RDW: 13.1 % (ref 11.5–15.5)
WBC: 8.2 10*3/uL (ref 4.0–10.5)

## 2015-12-11 MED ORDER — BISACODYL 10 MG RE SUPP
10.0000 mg | Freq: Once | RECTAL | Status: AC
Start: 2015-12-11 — End: 2015-12-11
  Administered 2015-12-11: 10 mg via RECTAL
  Filled 2015-12-11: qty 1

## 2015-12-11 MED ORDER — SENNOSIDES-DOCUSATE SODIUM 8.6-50 MG PO TABS: ORAL_TABLET | ORAL | Status: DC

## 2015-12-11 MED ORDER — SENNOSIDES-DOCUSATE SODIUM 8.6-50 MG PO TABS
2.0000 | ORAL_TABLET | Freq: Once | ORAL | Status: AC
  Administered 2015-12-11: 2 via ORAL
  Filled 2015-12-11: qty 2

## 2015-12-11 MED ORDER — ACETAMINOPHEN 325 MG PO TABS
ORAL_TABLET | ORAL | Status: DC
Start: 2015-12-11 — End: 2016-05-19

## 2015-12-11 MED ORDER — OXYCODONE-ACETAMINOPHEN 5-325 MG PO TABS: 1.0000 | ORAL_TABLET | ORAL | Status: DC | PRN

## 2015-12-11 MED ORDER — SENNOSIDES-DOCUSATE SODIUM 8.6-50 MG PO TABS: 2.0000 | ORAL_TABLET | Freq: Every evening | ORAL | Status: DC | PRN

## 2015-12-11 NOTE — Progress Notes (Signed)
Interpreter Lesle Chris for [employer]

## 2015-12-11 NOTE — Progress Notes (Signed)
Patient for discharge, IV saline lock removed. Medications and discharge instructions explained to the patient and his son. They verbalized understanding.

## 2015-12-11 NOTE — Progress Notes (Signed)
2 Days Post-Op  Subjective: She is doing better, she has a headache this AM and that is her major complaint.  She had some fever last PM, but it is better.  She has had some nausea with standing but her Orthostatic's last PM were OK .  We will repeat them this AM.  She ate a good breakfast and notes she has not had a BM for 5 day.  The hospital interpreter was here and assisted with the interview.  I also used her to give instructions.    Objective: Vital signs in last 24 hours: Temp:  [98.7 F (37.1 C)-100.9 F (38.3 C)] 98.7 F (37.1 C) (01/20 0501) Pulse Rate:  [60-79] 68 (01/20 0848) Resp:  [14-18] 16 (01/20 0848) BP: (108-124)/(52-68) 116/52 mmHg (01/20 0848) SpO2:  [93 %-97 %] 94 % (01/20 0501) Last BM Date: 12/07/15 PO 240 recorded Voided x 3 Tm 100.9 2300 afebrile since, VSS Labs all look fine,   Intake/Output from previous day: 01/19 0701 - 01/20 0700 In: 2321.3 [P.O.:240; I.V.:2081.3] Out: -  Intake/Output this shift:    General appearance: alert, cooperative and no distress Resp: clear to auscultation bilaterally GI: soft, sore, sites all look great, no distension< +BS.  Lab Results:   Recent Labs  12/10/15 0634 12/11/15 0437  WBC 9.9 8.2  HGB 12.3 12.7  HCT 35.7* 38.1  PLT 140* 121*    BMET  Recent Labs  12/10/15 0634 12/11/15 0437  NA 140 142  K 3.7 3.6  CL 107 103  CO2 27 28  GLUCOSE 113* 149*  BUN 15 13  CREATININE 0.85 0.83  CALCIUM 8.6* 8.8*   PT/INR No results for input(s): LABPROT, INR in the last 72 hours.   Recent Labs Lab 12/08/15 2027 12/10/15 0634 12/11/15 0437  AST 31 44* 43*  ALT 23 30 30   ALKPHOS 89 63 70  BILITOT 0.5 0.9 1.0  PROT 6.5 5.8* 6.0*  ALBUMIN 3.6 3.0* 3.1*     Lipase     Component Value Date/Time   LIPASE 33 12/08/2015 2027     Studies/Results: Dg Cholangiogram Operative  12/10/2015  ADDENDUM REPORT: 12/10/2015 12:31 ADDENDUM: Addendum created to address findings within the cystic duct. After  further review, there do appear to be filling defects within the proximal cystic duct at the gallbladder neck, representing debris/stones in the proximal cystic duct. These are evident on the follow-up MRCP. Signed, Dulcy Fanny. Earleen Newport, DO [facility] Radiology Electronically Signed   By: Corrie Mckusick D.O.   On: 12/10/2015 12:31  12/10/2015  CLINICAL DATA:  60 year old female with a history of cholelithiasis. EXAM: INTRAOPERATIVE CHOLANGIOGRAM TECHNIQUE: Cholangiographic images from the C-arm fluoroscopic device were submitted for interpretation post-operatively. Please see the procedural report for the amount of contrast and the fluoroscopy time utilized. COMPARISON:  Ultrasound 12/09/2015, CT 11/19/2015 FINDINGS: Surgical instruments project over the upper abdomen. There is cannulation of the cystic duct/gallbladder neck, with antegrade infusion of contrast. Caliber of the extrahepatic ductal system within normal limits. No large filling defect identified. Small amount of extraluminal contrast per Free flow of contrast across the ampulla. IMPRESSION: Intraoperative cholangiogram demonstrates extrahepatic biliary ducts of unremarkable caliber, with no large filling defect identified. Free flow of contrast across the ampulla. Please refer to the dictated operative report for full details of intraoperative findings and procedure Signed, Dulcy Fanny. Earleen Newport, DO [facility] Radiology Electronically Signed: By: Corrie Mckusick D.O. On: 12/09/2015 12:48  Mr 3d Recon At Scanner  12/10/2015  CLINICAL DATA:  Laparoscopic cholecystectomy on 12/09/2015. Concern for a cystic duct/common bile duct calculus. EXAM: MRI ABDOMEN WITHOUT AND WITH CONTRAST (INCLUDING MRCP) TECHNIQUE: Multiplanar multisequence MR imaging of the abdomen was performed both before and after the administration of intravenous contrast. Heavily T2-weighted images  of the biliary and pancreatic ducts were obtained, and three-dimensional MRCP images were rendered by post processing. CONTRAST:  47mL MULTIHANCE GADOBENATE DIMEGLUMINE 529 MG/ML IV SOLN COMPARISON:  Intraoperative cholangiogram 12/09/2015, ultrasound 12/09/2015, CT 11/19/2015. FINDINGS: Lower chest:  Lung bases are clear. Hepatobiliary: No intrahepatic biliary duct dilatation. The common bile duct is mildly dilated to 10 mm. There is no filling defect within the common bile duct. No external compression. The duct dilatation extends to the level of the ampulla again without filling defect. There is a a calculus within the cystic duct remnant measuring 5 mm on image 19, series 6. There is small amount of fluid within the gallbladder fossa and in Morrison's pouch related to recent surgery. No evidence of organized fluid collection. No significant intraperitoneal fluid identified beyond the gallbladder fossa. Pancreas: No pancreatic duct dilatation. No pancreatic inflammation. Spleen: Normal spleen. Adrenals/urinary tract: Adrenal glands and kidneys are normal. Nonenhancing hemorrhagic cysts of the LEFT kidney measures 19 mm (image 31, series 17 ; image 42, series 11602). Stomach/Bowel: Stomach and limited of the small bowel is unremarkable Vascular/Lymphatic: Abdominal aortic normal caliber. No retroperitoneal periportal lymphadenopathy. Musculoskeletal: No aggressive osseous lesion IMPRESSION: 1. Mild fusiform dilatation of the common bile duct to the level of the ampulla. No choledocholithiasis. 2. Retained 5 mm calculus within the cystic duct remnant. 3. Fluid within the gallbladder fossa and along the RIGHT hepatic margin as expected following cholecystectomy 1 day prior. 4. Bosniak 2 hemorrhagic cyst of the LEFT kidney. Electronically Signed   By: Suzy Bouchard M.D.   On: 12/10/2015 08:51   Mr Abd W/wo Cm/mrcp  12/10/2015  CLINICAL DATA:  Laparoscopic cholecystectomy on 12/09/2015. Concern for a cystic  duct/common bile duct calculus. EXAM: MRI ABDOMEN WITHOUT AND WITH CONTRAST (INCLUDING MRCP) TECHNIQUE: Multiplanar multisequence MR imaging of the abdomen was performed both before and after the administration of intravenous contrast. Heavily T2-weighted images of the biliary and pancreatic ducts were obtained, and three-dimensional MRCP images were rendered by post processing. CONTRAST:  33mL MULTIHANCE GADOBENATE DIMEGLUMINE 529 MG/ML IV SOLN COMPARISON:  Intraoperative cholangiogram 12/09/2015, ultrasound 12/09/2015, CT 11/19/2015. FINDINGS: Lower chest:  Lung bases are clear. Hepatobiliary: No intrahepatic biliary duct dilatation. The common bile duct is mildly dilated to 10 mm. There is no filling defect within the common bile duct. No external compression. The duct dilatation extends to the level of the ampulla again without filling defect. There is a a calculus within the cystic duct remnant measuring 5 mm on image 19, series 6. There is small amount of fluid within the gallbladder fossa and in Morrison's pouch related to recent surgery. No evidence of organized fluid collection. No significant intraperitoneal fluid identified beyond the gallbladder fossa. Pancreas: No pancreatic duct dilatation. No pancreatic inflammation. Spleen: Normal spleen. Adrenals/urinary tract: Adrenal glands and kidneys are normal. Nonenhancing hemorrhagic cysts of the LEFT kidney measures 19 mm (image 31, series 17 ; image 42, series 11602). Stomach/Bowel: Stomach and limited of the small bowel is unremarkable Vascular/Lymphatic: Abdominal aortic normal caliber. No retroperitoneal periportal lymphadenopathy. Musculoskeletal: No aggressive osseous lesion IMPRESSION: 1. Mild fusiform dilatation of the common bile duct to the level of the ampulla. No choledocholithiasis. 2.  Retained 5 mm calculus within the cystic duct remnant. 3. Fluid within the gallbladder fossa and along the RIGHT hepatic margin as expected following  cholecystectomy 1 day prior. 4. Bosniak 2 hemorrhagic cyst of the LEFT kidney. Electronically Signed   By: Suzy Bouchard M.D.   On: 12/10/2015 08:51    Medications: . carvedilol  12.5 mg Oral BID WC  . famotidine  20 mg Oral QHS  . gabapentin  100 mg Oral TID  . heparin subcutaneous  5,000 Units Subcutaneous 3 times per day  . losartan  100 mg Oral Daily    Assessment/Plan symptomatic cholelithiasis, cirrhotic appearing liver, probable common bile duct stones S/p laparoscopic cholecystectomy & IOC, 12/09/15, Dr.Eric Wilson Retained 5 mm calculus within the cystic duct Remnant AODM Hypertension CAD Hx of Stroke Breast cancer Antibiotics: None DVT: SCD/ add heparin later today    Plan:  She just got Percocet for headache and abdominal pain, I will give her a dulcolax supp to help with constipation.  Add some Sennakot also PRN.  I told her if she does well we will let her go home later today.      Nisha Dhami 12/11/2015

## 2015-12-21 ENCOUNTER — Encounter: Payer: Self-pay | Admitting: Internal Medicine

## 2015-12-21 ENCOUNTER — Ambulatory Visit: Payer: No Typology Code available for payment source | Attending: Internal Medicine | Admitting: Internal Medicine

## 2015-12-21 VITALS — BP 161/84 | HR 59 | Temp 98.0°F | Resp 16 | Ht 60.0 in | Wt 138.0 lb

## 2015-12-21 DIAGNOSIS — E119 Type 2 diabetes mellitus without complications: Secondary | ICD-10-CM | POA: Insufficient documentation

## 2015-12-21 DIAGNOSIS — Z853 Personal history of malignant neoplasm of breast: Secondary | ICD-10-CM | POA: Insufficient documentation

## 2015-12-21 DIAGNOSIS — K5900 Constipation, unspecified: Secondary | ICD-10-CM | POA: Insufficient documentation

## 2015-12-21 DIAGNOSIS — Z7982 Long term (current) use of aspirin: Secondary | ICD-10-CM | POA: Insufficient documentation

## 2015-12-21 DIAGNOSIS — Z8673 Personal history of transient ischemic attack (TIA), and cerebral infarction without residual deficits: Secondary | ICD-10-CM | POA: Insufficient documentation

## 2015-12-21 DIAGNOSIS — R1084 Generalized abdominal pain: Secondary | ICD-10-CM | POA: Insufficient documentation

## 2015-12-21 DIAGNOSIS — I10 Essential (primary) hypertension: Secondary | ICD-10-CM | POA: Insufficient documentation

## 2015-12-21 DIAGNOSIS — Z7984 Long term (current) use of oral hypoglycemic drugs: Secondary | ICD-10-CM | POA: Insufficient documentation

## 2015-12-21 DIAGNOSIS — Z79899 Other long term (current) drug therapy: Secondary | ICD-10-CM | POA: Insufficient documentation

## 2015-12-21 DIAGNOSIS — I2510 Atherosclerotic heart disease of native coronary artery without angina pectoris: Secondary | ICD-10-CM | POA: Insufficient documentation

## 2015-12-21 LAB — GLUCOSE, POCT (MANUAL RESULT ENTRY): POC GLUCOSE: 256 mg/dL — AB (ref 70–99)

## 2015-12-21 LAB — POCT GLYCOSYLATED HEMOGLOBIN (HGB A1C): Hemoglobin A1C: 6.3

## 2015-12-21 MED ORDER — POLYETHYLENE GLYCOL 3350 17 GM/SCOOP PO POWD: 17.0000 g | Freq: Every day | ORAL | Status: DC

## 2015-12-21 MED ORDER — RANITIDINE HCL 150 MG PO TABS: 150.0000 mg | ORAL_TABLET | Freq: Two times a day (BID) | ORAL | Status: DC

## 2015-12-21 MED FILL — LOSARTAN POTASSIUM 100 MG T: 100 | 30 days supply | Qty: 30 | Fill #2

## 2015-12-21 MED FILL — CARVEDILOL 6.25 MG TABLET: 6.25 | 30 days supply | Qty: 60 | Fill #7

## 2015-12-21 MED FILL — ATORVASTATIN 10 MG TABLET: 10 | 30 days supply | Qty: 30 | Fill #7

## 2015-12-21 MED FILL — GABAPENTIN 100 MG CAPSULE: 100 | 30 days supply | Qty: 90 | Fill #2

## 2015-12-21 MED FILL — ?PANTOPRAZOLE SOD DR 40MG: 40 MG | 30 days supply | Qty: 30 | Fill #2

## 2015-12-21 MED FILL — raNITIdine HCL 150 MG TABS: 150 | 30 days supply | Qty: 60 | Fill #0

## 2015-12-21 MED FILL — ?METFORMIN HCL 1,000 MG TAB: 1000 | 30 days supply | Qty: 60 | Fill #1

## 2015-12-21 NOTE — Progress Notes (Signed)
Patient ID: Brandi Bryant, female   DOB: Aug 17, 1956, 60 y.o.   MRN: [id number]  CC: DM  HPI: Brandi Bryant is a 60 y.o. female here today for a follow up visit. Patient has past medical history of CAD, diabetes, stroke, breast cancer, hypertension. Patient was seen in the ER 2 weeks ago for abdominal pain and was admitted for symptomatic cholelithiasis. At that time patient had a laparoscopic cholecystectomy on 12/09/2015. She complains of pain and burning in her abdomen after she eats. Patient feels bloated and reports that she has been eating heavy greasy meals. She notes the pain is aggravated by eating meat. She has not been having regular daily stools and stopped taking Senokot because it gave her cramps.     Allergies  Allergen Reactions  . Gadolinium Derivatives     Code: VOM, Desc: Pt began vomiting 45 sec after MRI contrast injection of Multihance, Onset Date: [id number]     Past Medical History  Diagnosis Date  . Breast cancer ([place])   . HTN (hypertension)   . Diabetes mellitus   . Cholelithiasis   . Hepatic steatosis   . Abdominal pain   . History of breast cancer 06/01/2012  . Arthralgia 08/13/2013  . Chest pain 09/30/2013  . Stroke [facility]) 2006  . Bell's palsy   . Coronary artery disease   . Colon cancer screening 10/27/2015  . Encounter for screening colonoscopy 10/30/2015   Current Outpatient Prescriptions on File Prior to Visit  Medication Sig Dispense Refill  . acetaminophen (TYLENOL) 325 MG tablet Your prescription pain medicine also has Tylenol (acetaminophen.)  You can only take 4000 mg of this per day.  You have to count each prescription pain pill as a Tylenol.    Marland Kitchen atorvastatin (LIPITOR) 10 MG tablet Take 1 tablet (10 mg total) by mouth daily. 90 tablet 10  . calcium carbonate (TUMS - DOSED IN MG ELEMENTAL CALCIUM) 500 MG chewable tablet Chew 1 tablet by mouth daily. Reported on 11/19/2015    . carvedilol (COREG) 12.5 MG tablet Take 1 tablet (12.5 mg  total) by mouth 2 (two) times daily with a meal. 60 tablet 6  . gabapentin (NEURONTIN) 100 MG capsule TAKE 1 CAPSULE BY MOUTH 3 TIMES DAILY 90 capsule 3  . losartan (COZAAR) 100 MG tablet Take 1 tablet (100 mg total) by mouth daily. 30 tablet 4  . metFORMIN (GLUCOPHAGE) 1000 MG tablet TAKE 1 TABLET BY MOUTH 2 TIMES DAILY WITH A MEAL. 60 tablet 4  . oxyCODONE-acetaminophen (PERCOCET/ROXICET) 5-325 MG tablet Take 1-2 tablets by mouth every 4 (four) hours as needed for moderate pain. 40 tablet 0  . ranitidine (ZANTAC) 150 MG tablet Take 150 mg by mouth 2 (two) times daily.    Marland Kitchen albuterol (PROVENTIL HFA;VENTOLIN HFA) 108 (90 BASE) MCG/ACT inhaler Inhale 1-2 puffs into the lungs every 6 (six) hours as needed for wheezing. 1 Inhaler 0  . PRESCRIPTION MEDICATION Take 0.5 tablets by mouth daily. Patient takes a sleep medication her daughter gives her however patient doesn't know what it is called.    . senna-docusate (SENOKOT-S) 8.6-50 MG tablet Follow the direction on the package.  You can buy this without a prescription.    . [DISCONTINUED] escitalopram (LEXAPRO) 10 MG tablet Take 10 mg by mouth daily.     Current Facility-Administered Medications on File Prior to Visit  Medication Dose Route Frequency Provider Last Rate Last Dose  . aspirin tablet 325 mg  325 mg Oral Daily Lance Bosch, NP  325 mg at 09/02/15 1628   Family History  Problem Relation Age of Onset  . Thyroid cancer Sister   . Hypertension Mother   . Diabetes Mother   . Migraines Daughter    Social History   Social History  . Marital Status: Single    Spouse Name: N/A  . Number of Children: N/A  . Years of Education: N/A   Occupational History  . Not on file.   Social History Main Topics  . Smoking status: Never Smoker   . Smokeless tobacco: Never Used  . Alcohol Use: No  . Drug Use: No  . Sexual Activity: Not Currently    Birth Control/ Protection: Surgical   Other Topics Concern  . Not on file   Social History  Narrative    Review of Systems: Other than what is stated in HPI, all other systems are negative.   Objective:   Filed Vitals:   12/21/15 1603  BP: 161/84  Pulse: 59  Temp: 98 F (36.7 C)  Resp: 16    Physical Exam  Cardiovascular: Normal rate, regular rhythm and normal heart sounds.   Pulmonary/Chest: Effort normal and breath sounds normal.  Abdominal: Soft. Bowel sounds are normal. She exhibits no distension. There is tenderness.  Several healing surgical wounds  Skin: Skin is warm and dry. No erythema.     Lab Results  Component Value Date   WBC 8.2 12/11/2015   HGB 12.7 12/11/2015   HCT 38.1 12/11/2015   MCV 89.0 12/11/2015   PLT 121* 12/11/2015   Lab Results  Component Value Date   CREATININE 0.83 12/11/2015   BUN 13 12/11/2015   NA 142 12/11/2015   K 3.6 12/11/2015   CL 103 12/11/2015   CO2 28 12/11/2015    Lab Results  Component Value Date   HGBA1C 6.30 12/21/2015   Lipid Panel     Component Value Date/Time   CHOL 155 04/18/2014 0855   TRIG 129 04/18/2014 0855   HDL 47 04/18/2014 0855   CHOLHDL 3.3 04/18/2014 0855   VLDL 26 04/18/2014 0855   LDLCALC 82 04/18/2014 0855       Assessment and plan:   Lyfe was seen today for abdominal pain.  Diagnoses and all orders for this visit:  Generalized abdominal pain -     Begin ranitidine (ZANTAC) 150 MG tablet; Take 1 tablet (150 mg total) by mouth 2 (two) times daily. Pain likely a combination of surgical pain and some GERD. I have went over in detail diet changes that are needed after having gallbladder removal. A list of diet changes was provided to patient as well.   Constipation, unspecified constipation type -    Begin  polyethylene glycol powder (GLYCOLAX/MIRALAX) powder; Take 17 g by mouth daily. Stop senokot due to abdominal cramping and switch to Miralax.   Type 2 diabetes mellitus without complication, without long-term current use of insulin (HCC) -     Glucose (CBG) -     HgB  A1c Patients diabetes is well control as evidence by consistently low a1c.  Patient will continue with current therapy and continue to make necessary lifestyle changes.  Reviewed foot care, diet, exercise, annual health maintenance with patient.   Due to language barrier, an interpreter was present during the history-taking and subsequent discussion (and for part of the physical exam) with this patient.  Return in about 3 months (around 03/20/2016) for Diabetes Mellitus.        Lance Bosch, NP-C  [facility] [phone] 12/21/2015, 4:19 PM

## 2015-12-21 NOTE — Patient Instructions (Signed)
Dieta con bajo contenido de grasas para la pancreatitis o los trastornos de la vescula (Low-Fat Diet for Pancreatitis or Gallbladder Conditions) Una dieta con bajo contenido de grasas puede ser til si usted tiene pancreatitis o trastornos de la vescula. En estos trastornos, el pncreas y la vescula tienen dificultades para digerir las grasas. Un plan de alimentacin saludable con menos grasas ayudar a que el pncreas y la vescula descansen, y reducir los sntomas. QU DEBO SABER ACERCA DE ESTA [place]?  Consuma una dieta con bajo contenido de [place].  Reduzca el consumo de grasas a menos del 20% al 30% del total de caloras diarias. Esto representa menos de 50a 60gramos de grasas por da.  Recuerde que la dieta debe incluir algo de [place]. Consulte al nutricionista cul debe ser su meta diaria.  Elija alimentos saludables sin grasas o con bajo contenido de grasas. Busque las palabras "sin grasa", "bajo en grasas" o "bajo contenido de grasas".  [employer], mire la etiqueta y elija alimentos con menos de 3gramos de grasa por porcin. Coma solo una porcin.  Evite el alcohol.  No fumar. Si necesita ayuda para dejar de fumar, hable con el mdico.  Haga comidas pequeas y frecuentes en lugar de 3 comidas abundantes y pesadas. QU ALIMENTOS PUEDO COMER? Cereales Incluya granos y almidones saludables, como papas, pan integral, cereales ricos en fibras y [place] integral. Elija opciones de cereales integrales siempre que sea posible. En los adultos, los cereales integrales deben representar del 45% al 65% de las caloras diarias.  Lambert Mody y verduras Coma muchas frutas y verduras. Las frutas y verduras frescas aportan fibra a la dieta. [place] y otras fuentes de protenas Coma carnes [place], como pollo y 40. Quite la grasa de la carne antes de cocinarla. Los [place], el pescado y los frijoles son otras fuentes de protenas. En los adultos, estos alimentos deben representar entre el 10% y  el 35% de las caloras diarias. [place] y otros productos lcteos con bajo contenido de [place]. Los lcteos aportan grasas y protenas, adems de calcio.  Grasas y [employer] alimentos con alto contenido de grasas, como las comidas fritas, los [place], los productos horneados y las bebidas azucaradas.  [place] condimentos y las salsas cremosas, como la [place], [place] aportar grasa adicional. Piense si necesita usarlos o no, o use pequeas cantidades u opciones con bajo contenido de grasas. QU ALIMENTOS NO ESTN RECOMENDADOS?  Los alimentos con alto contenido de [place], por ejemplo:  Alimentos horneados.  Helados.  Tostadas francesas.  Panecillos dulces.  Pizza.  Pan de queso.  Alimentos rebozados o cubiertos con [place], salsas cremosas o queso.  Comidas fritas.  Postres y bebidas azucaradas.  Alimentos que causan gases o meteorismo   Esta informacin no tiene Marine scientist el consejo del mdico. Asegrese de hacerle al mdico cualquier pregunta que tenga.   Document Released: 11/12/2013 Elsevier Interactive Patient Education [employer].

## 2015-12-21 NOTE — Progress Notes (Signed)
Interpreter line used Seth Bake ID# [id number]  Patient states she had her gall bladder removed a week ago Complaining of having some pain and burning in her stomach Also stated every time she eats her stomach get bloated and swell

## 2015-12-22 MED ORDER — METFORMIN HCL 1000 MG PO TABS: ORAL_TABLET | ORAL | Status: DC

## 2016-01-18 MED FILL — LOSARTAN POTASSIUM 100 MG T: 100 | 30 days supply | Qty: 30 | Fill #3

## 2016-01-18 MED FILL — CARVEDILOL 6.25 MG TABLET: 6.25 | 30 days supply | Qty: 60 | Fill #8

## 2016-01-18 MED FILL — ?METFORMIN HCL 1,000 MG TAB: 1000 | 30 days supply | Qty: 60 | Fill #2

## 2016-02-19 MED FILL — ?CARVEDILOL 6.25 MG TABLET: 6.25 | 30 days supply | Qty: 60 | Fill #9

## 2016-02-19 MED FILL — ?METFORMIN HCL 1,000 MG TAB: 1000 | 30 days supply | Qty: 60 | Fill #3

## 2016-02-19 MED FILL — LOSARTAN POTASSIUM 100 MG T: 100 | 30 days supply | Qty: 30 | Fill #4

## 2016-03-30 MED FILL — ?METFORMIN HCL 1,000 MG TAB: 1000 | 30 days supply | Qty: 60 | Fill #4

## 2016-03-30 MED FILL — LOSARTAN POTASSIUM 50 MG TA: 50 | 30 days supply | Qty: 30 | Fill #1

## 2016-03-30 MED FILL — CARVEDILOL 12.5 MG TABLET: 12.5 | 30 days supply | Qty: 60 | Fill #0

## 2016-05-03 ENCOUNTER — Other Ambulatory Visit: Payer: Self-pay | Admitting: Internal Medicine

## 2016-05-03 MED FILL — ?METFORMIN HCL 1,000 MG TAB: 1000 | 30 days supply | Qty: 60 | Fill #0

## 2016-05-03 MED FILL — ?CARVEDILOL 12.5 MG TABLET: 12.5 | 30 days supply | Qty: 60 | Fill #1

## 2016-05-03 MED FILL — LOSARTAN POTASSIUM 50 MG TA: 50 | 30 days supply | Qty: 30 | Fill #2

## 2016-05-03 MED FILL — ?ATORVASTATIN 10 MG TABLET: 10 | 30 days supply | Qty: 30 | Fill #0

## 2016-05-04 ENCOUNTER — Ambulatory Visit: Payer: Self-pay | Attending: Physician Assistant | Admitting: Physician Assistant

## 2016-05-04 VITALS — BP 146/77 | HR 74 | Temp 98.0°F | Resp 18 | Ht 60.0 in | Wt 150.2 lb

## 2016-05-04 DIAGNOSIS — G8929 Other chronic pain: Secondary | ICD-10-CM | POA: Insufficient documentation

## 2016-05-04 DIAGNOSIS — I10 Essential (primary) hypertension: Secondary | ICD-10-CM | POA: Insufficient documentation

## 2016-05-04 DIAGNOSIS — Z853 Personal history of malignant neoplasm of breast: Secondary | ICD-10-CM | POA: Insufficient documentation

## 2016-05-04 DIAGNOSIS — IMO0001 Reserved for inherently not codable concepts without codable children: Secondary | ICD-10-CM

## 2016-05-04 DIAGNOSIS — E119 Type 2 diabetes mellitus without complications: Secondary | ICD-10-CM | POA: Insufficient documentation

## 2016-05-04 DIAGNOSIS — Z8673 Personal history of transient ischemic attack (TIA), and cerebral infarction without residual deficits: Secondary | ICD-10-CM | POA: Insufficient documentation

## 2016-05-04 DIAGNOSIS — R1084 Generalized abdominal pain: Secondary | ICD-10-CM

## 2016-05-04 DIAGNOSIS — I2510 Atherosclerotic heart disease of native coronary artery without angina pectoris: Secondary | ICD-10-CM | POA: Insufficient documentation

## 2016-05-04 DIAGNOSIS — Z7984 Long term (current) use of oral hypoglycemic drugs: Secondary | ICD-10-CM | POA: Insufficient documentation

## 2016-05-04 DIAGNOSIS — E1165 Type 2 diabetes mellitus with hyperglycemia: Secondary | ICD-10-CM

## 2016-05-04 DIAGNOSIS — R109 Unspecified abdominal pain: Secondary | ICD-10-CM | POA: Insufficient documentation

## 2016-05-04 DIAGNOSIS — E785 Hyperlipidemia, unspecified: Secondary | ICD-10-CM | POA: Insufficient documentation

## 2016-05-04 DIAGNOSIS — Z79899 Other long term (current) drug therapy: Secondary | ICD-10-CM | POA: Insufficient documentation

## 2016-05-04 LAB — GLUCOSE, POCT (MANUAL RESULT ENTRY): POC GLUCOSE: 310 mg/dL — AB (ref 70–99)

## 2016-05-04 LAB — POCT GLYCOSYLATED HEMOGLOBIN (HGB A1C): Hemoglobin A1C: 7.2

## 2016-05-04 MED ORDER — PANTOPRAZOLE SODIUM 40 MG PO TBEC: 40.0000 mg | DELAYED_RELEASE_TABLET | Freq: Two times a day (BID) | ORAL | Status: DC

## 2016-05-04 MED ORDER — ATORVASTATIN CALCIUM 10 MG PO TABS: 10.0000 mg | ORAL_TABLET | Freq: Every day | ORAL | Status: DC

## 2016-05-04 MED FILL — ?PANTOPRAZOLE SOD DR 40MG: 40 MG | 30 days supply | Qty: 60 | Fill #0

## 2016-05-04 NOTE — Progress Notes (Signed)
Patient is here for Abdominal Pain  Patient complains of abdominal discomfort being present constantly. Pain increases with eating. Patient states zantac has provided minimal relief.  Patient has not taken medication this morning. Patient has eaten this morning.

## 2016-05-04 NOTE — Progress Notes (Signed)
Chief Complaint: " my stomach hurts"  Subjective: This is a pleasant 60 year old Hispanic female with history of nonobstructive coronary artery disease, diabetes mellitus type 2, stroke, hypertension, breast cancer and hyperlipidemia. She is presenting acutely with abdominal pain. She has a history of laparoscopic cholecystectomy in January of this year. She states that she felt better initially after her surgery but has had some diffuse abdominal pain increasing over the last 2 weeks. She feels bloated. She feels gassy. Sometimes her symptoms radiate from the epigastric area up into her throat. Sometimes she feels like she has a heartburn. Sometimes she is nauseated. She has tried Zantac and some over-the-counter medications without relief. No diarrhea or constipation.   ROS:  GEN: denies fever or chills, denies change in weight Skin: denies lesions or rashes LUNGS: denies SHOB, dyspnea, PND, orthopnea CV: denies CP or palpitations ABD:+ abd pain, +N, no V, No constipation or diarrhea    Objective:  Filed Vitals:   05/04/16 0927  BP: 146/77  Pulse: 74  Temp: 98 F (36.7 C)  TempSrc: Oral  Resp: 18  Height: 5' (1.524 m)  Weight: 150 lb 3.2 oz (68.13 kg)  SpO2: 98%    Physical Exam:  General: in no acute distress. Heart: Normal  s1 &s2  Regular rate and rhythm, without murmurs, rubs, gallops. Lungs: Clear to auscultation bilaterally. Abdomen: Soft, nontender, nondistended, positive bowel sounds. BENIGN!  Pertinent Lab Results:CBG 310 she did not take meds this am   Medications: Prior to Admission medications   Medication Sig Start Date End Date Taking? Authorizing Provider  acetaminophen (TYLENOL) 325 MG tablet Your prescription pain medicine also has Tylenol (acetaminophen.)  You can only take 4000 mg of this per day.  You have to count each prescription pain pill as a Tylenol. 12/11/15  Yes Earnstine Regal, PA-C  albuterol (PROVENTIL HFA;VENTOLIN HFA) 108 (90 BASE)  MCG/ACT inhaler Inhale 1-2 puffs into the lungs every 6 (six) hours as needed for wheezing. 01/30/15  Yes Tanna Furry, MD  atorvastatin (LIPITOR) 10 MG tablet Take 1 tablet (10 mg total) by mouth daily. Needs office visit for refills 05/04/16  Yes Amber Guthridge Daneil Dan, PA-C  calcium carbonate (TUMS - DOSED IN MG ELEMENTAL CALCIUM) 500 MG chewable tablet Chew 1 tablet by mouth daily. Reported on 11/19/2015   Yes Historical Provider, MD  carvedilol (COREG) 12.5 MG tablet Take 1 tablet (12.5 mg total) by mouth 2 (two) times daily with a meal. 10/07/15  Yes Renella Cunas, MD  gabapentin (NEURONTIN) 100 MG capsule TAKE 1 CAPSULE BY MOUTH 3 TIMES DAILY 09/28/15  Yes Lance Bosch, NP  losartan (COZAAR) 100 MG tablet Take 1 tablet (100 mg total) by mouth daily. 09/16/15  Yes Lance Bosch, NP  metFORMIN (GLUCOPHAGE) 1000 MG tablet TAKE 1 TABLET BY MOUTH 2 TIMES DAILY WITH A MEAL. 12/22/15  Yes Lance Bosch, NP  polyethylene glycol powder (GLYCOLAX/MIRALAX) powder Take 17 g by mouth daily. 12/21/15  Yes Lance Bosch, NP  PRESCRIPTION MEDICATION Take 0.5 tablets by mouth daily. Patient takes a sleep medication her daughter gives her however patient doesn't know what it is called.   Yes Historical Provider, MD  senna-docusate (SENOKOT-S) 8.6-50 MG tablet Follow the direction on the package.  You can buy this without a prescription. 12/11/15  Yes Earnstine Regal, PA-C  oxyCODONE-acetaminophen (PERCOCET/ROXICET) 5-325 MG tablet Take 1-2 tablets by mouth every 4 (four) hours as needed for moderate pain. Patient not taking: Reported on 05/04/2016 12/11/15  Earnstine Regal, PA-C  pantoprazole (PROTONIX) 40 MG tablet Take 1 tablet (40 mg total) by mouth 2 (two) times daily. 05/04/16   Brayton Caves, PA-C    Assessment: Acute on chronic abdominal pain s/p lap chole 12/09/15    Plan: DC Zantac and the OTC med that she has been taking Protonix 40 mg BID  Follow up:2 weeks with Dr. Janne Napoleon  The patient was given  clear instructions to go to ER or return to medical center if symptoms don't improve, worsen or new problems develop. The patient verbalized understanding. The patient was told to call to get lab results if they haven't heard anything in the next week.   This note has been created with Surveyor, quantity. Any transcriptional errors are unintentional.   Zettie Pho, PA-C 05/04/2016, 9:46 AM

## 2016-05-07 LAB — MICROALBUMIN / CREATININE URINE RATIO
CREATININE, URINE: 57 mg/dL (ref 20–320)
MICROALB/CREAT RATIO: 9 ug/mg{creat} (ref <30)
Microalb, Ur: 0.5 mg/dL

## 2016-05-19 ENCOUNTER — Ambulatory Visit: Payer: Self-pay | Attending: Family Medicine | Admitting: Family Medicine

## 2016-05-19 ENCOUNTER — Encounter: Payer: Self-pay | Admitting: Family Medicine

## 2016-05-19 VITALS — BP 131/81 | HR 66 | Temp 98.1°F | Resp 20 | Ht 65.0 in | Wt 147.8 lb

## 2016-05-19 DIAGNOSIS — E119 Type 2 diabetes mellitus without complications: Secondary | ICD-10-CM | POA: Insufficient documentation

## 2016-05-19 DIAGNOSIS — K5909 Other constipation: Secondary | ICD-10-CM

## 2016-05-19 DIAGNOSIS — G44209 Tension-type headache, unspecified, not intractable: Secondary | ICD-10-CM | POA: Insufficient documentation

## 2016-05-19 DIAGNOSIS — G4700 Insomnia, unspecified: Secondary | ICD-10-CM | POA: Insufficient documentation

## 2016-05-19 DIAGNOSIS — I10 Essential (primary) hypertension: Secondary | ICD-10-CM | POA: Insufficient documentation

## 2016-05-19 DIAGNOSIS — I25119 Atherosclerotic heart disease of native coronary artery with unspecified angina pectoris: Secondary | ICD-10-CM | POA: Insufficient documentation

## 2016-05-19 DIAGNOSIS — Z7982 Long term (current) use of aspirin: Secondary | ICD-10-CM | POA: Insufficient documentation

## 2016-05-19 DIAGNOSIS — Z7984 Long term (current) use of oral hypoglycemic drugs: Secondary | ICD-10-CM | POA: Insufficient documentation

## 2016-05-19 DIAGNOSIS — R1084 Generalized abdominal pain: Secondary | ICD-10-CM | POA: Insufficient documentation

## 2016-05-19 DIAGNOSIS — Z79899 Other long term (current) drug therapy: Secondary | ICD-10-CM | POA: Insufficient documentation

## 2016-05-19 DIAGNOSIS — E138 Other specified diabetes mellitus with unspecified complications: Secondary | ICD-10-CM

## 2016-05-19 DIAGNOSIS — K5900 Constipation, unspecified: Secondary | ICD-10-CM | POA: Insufficient documentation

## 2016-05-19 LAB — GLUCOSE, POCT (MANUAL RESULT ENTRY): POC GLUCOSE: 256 mg/dL — AB (ref 70–99)

## 2016-05-19 MED ORDER — TRAZODONE HCL 50 MG PO TABS: 50.0000 mg | ORAL_TABLET | Freq: Every evening | ORAL | Status: DC | PRN

## 2016-05-19 MED ORDER — LOSARTAN POTASSIUM 100 MG PO TABS: 100.0000 mg | ORAL_TABLET | Freq: Every day | ORAL | Status: DC

## 2016-05-19 MED ORDER — LACTULOSE 10 GM/15ML PO SOLN: 10.0000 g | Freq: Three times a day (TID) | ORAL | Status: DC

## 2016-05-19 MED ORDER — METFORMIN HCL 1000 MG PO TABS: ORAL_TABLET | ORAL | Status: DC

## 2016-05-19 MED ORDER — GABAPENTIN 100 MG PO CAPS: 100.0000 mg | ORAL_CAPSULE | Freq: Three times a day (TID) | ORAL | Status: DC

## 2016-05-19 MED ORDER — CARVEDILOL 12.5 MG PO TABS: 12.5000 mg | ORAL_TABLET | Freq: Two times a day (BID) | ORAL | Status: DC

## 2016-05-19 MED FILL — LACTULOSE 10 GM/15 ML SOLN: 10 | 30 days supply | Qty: 946 | Fill #0

## 2016-05-19 MED FILL — LOSARTAN POTASSIUM 100 MG T: 100 | 30 days supply | Qty: 30 | Fill #0

## 2016-05-19 MED FILL — CARVEDILOL 12.5 MG TABLET: 12.5 | 30 days supply | Qty: 60 | Fill #0

## 2016-05-19 MED FILL — traZODone HCL 50 MG TABS: 50 | 30 days supply | Qty: 30 | Fill #0

## 2016-05-19 MED FILL — GABAPENTIN 100 MG CAPSULE: 100 | 30 days supply | Qty: 90 | Fill #0

## 2016-05-19 MED FILL — metFORMIN HCL 1000 MG TABS: 1000 | 30 days supply | Qty: 60 | Fill #0

## 2016-05-19 NOTE — Patient Instructions (Signed)
Estreimiento - Adultos (Constipation, Adult) Estreimiento significa que una persona tiene menos de tres evacuaciones en una semana, dificultad para defecar, o que las heces son secas, duras, o ms grandes que lo normal. A medida que envejecemos el estreimiento es ms comn. Una dieta baja en fibra, no tomar suficientes lquidos y el uso de ciertos medicamentos pueden empeorar el estreimiento.  CAUSAS   Ciertos medicamentos, como los antidepresivos, analgsicos, suplementos de hierro, anticidos y diurticos.  Algunas enfermedades, como la diabetes, el sndrome del colon irritable, enfermedad de la tiroides, o depresin.  No beber suficiente agua.  No consumir suficientes alimentos ricos en fibra.  Situaciones de estrs o viajes.  Falta de actividad fsica o de ejercicio.  Ignorar la necesidad sbita de defecar.  Uso en exceso de laxantes. SIGNOS Y SNTOMAS   Defecar menos de tres veces por semana.  Dificultad para defecar.  Tener las heces secas y duras, o ms grandes que las normales.  Sensacin de estar lleno o hinchado.  Dolor en la parte baja del abdomen.  No sentir alivio despus de defecar. DIAGNSTICO  El mdico le har una historia clnica y un examen fsico. Pueden hacerle exmenes adicionales para el estreimiento grave. Estos estudios pueden ser:  Un radiografa con enema de bario para examinar el recto, el colon y, en algunos casos, el intestino delgado.  Una sigmoidoscopia para examinar el colon inferior.  Una colonoscopia para examinar todo el colon. TRATAMIENTO  El tratamiento depender de la gravedad del estreimiento y de la causa. Algunos tratamientos nutricionales son beber ms lquidos y comer ms alimentos ricos en fibra. El cambio en el estilo de vida incluye hacer ejercicios de manera regular. Si estas recomendaciones para realizar cambios en la dieta y en el estilo de vida no ayudan, el mdico le puede indicar el uso de laxantes de venta libre  para ayudarlo a defecar. Los medicamentos recetados se pueden prescribir si los medicamentos de venta libre no lo ayudan.  INSTRUCCIONES PARA EL CUIDADO EN EL HOGAR   Consuma alimentos con alto contenido de fibra, como frutas, vegetales, cereales integrales y porotos.  Limite los alimentos procesados ricos en grasas y azcar, como las papas fritas, hamburguesas, galletas, dulces y refrescos.  Puede agregar un suplemento de fibra a su dieta si no obtiene lo suficiente de los alimentos.  Beba suficiente lquido para mantener la orina clara o de color amarillo plido.  Haga ejercicio regularmente o segn las indicaciones del mdico.  Vaya al bao cuando sienta la necesidad de ir. No se aguante las ganas.  Tome solo medicamentos de venta libre o recetados, segn las indicaciones del mdico. No tome otros medicamentos para el estreimiento sin consultarlo antes con su mdico. SOLICITE ATENCIN MDICA DE INMEDIATO SI:   Observa sangre brillante en las heces.  El estreimiento dura ms de 4 das o empeora.  Siente dolor abdominal o rectal.  Las heces son delgadas como un lpiz.  Pierde peso de manera inexplicable. ASEGRESE DE QUE:   Comprende estas instrucciones.  Controlar su afeccin.  Recibir ayuda de inmediato si no mejora o si empeora.   Esta informacin no tiene como fin reemplazar el consejo del mdico. Asegrese de hacerle al mdico cualquier pregunta que tenga.   Document Released: 11/27/2007 Document Revised: 11/28/2014 Elsevier Interactive Patient Education 2016 Elsevier Inc.  

## 2016-05-19 NOTE — Progress Notes (Signed)
Headache  Medication refill Pain med not working "belly ache"- ?due to stone left after gallbladder surgery

## 2016-05-19 NOTE — Progress Notes (Signed)
Subjective:  Patient ID: Brandi Bryant, female    DOB: 1955/12/21  Age: 60 y.o. MRN: [id number]  CC: Follow-up; headaches; and Abdominal Pain   HPI Brandi Bryant is a 60 year old female with a history of type 2 diabetes mellitus (A1c 7.2 from 04/2016), coronary artery disease, hypertension, status post laparoscopic cholecystectomy in 11/2015 who comes in today for follow-up visit.  She was seen by the PA  2 weeks ago for abdominal pain and was placed on Protonix which she states does not help her symptoms. Abdominal pain has been on for the last 4 weeks and is infraumbilical; wakes up from sleep and is worse in the mornings. She endorses constipation but denies urinary symptoms, vaginal discharge, nausea or vomiting. Last Pap smear was in 2015 and was normal.  She also has headaches located in the central portion of her head for the last 1-1/2 weeks and denies sinus symptoms. She endorses insomnia.  Outpatient Prescriptions Prior to Visit  Medication Sig Dispense Refill  . albuterol (PROVENTIL HFA;VENTOLIN HFA) 108 (90 BASE) MCG/ACT inhaler Inhale 1-2 puffs into the lungs every 6 (six) hours as needed for wheezing. 1 Inhaler 0  . atorvastatin (LIPITOR) 10 MG tablet Take 1 tablet (10 mg total) by mouth daily. Needs office visit for refills 90 tablet 3  . pantoprazole (PROTONIX) 40 MG tablet Take 1 tablet (40 mg total) by mouth 2 (two) times daily. 60 tablet 0  . carvedilol (COREG) 12.5 MG tablet Take 1 tablet (12.5 mg total) by mouth 2 (two) times daily with a meal. 60 tablet 6  . losartan (COZAAR) 100 MG tablet Take 1 tablet (100 mg total) by mouth daily. 30 tablet 4  . metFORMIN (GLUCOPHAGE) 1000 MG tablet TAKE 1 TABLET BY MOUTH 2 TIMES DAILY WITH A MEAL. 60 tablet 4  . calcium carbonate (TUMS - DOSED IN MG ELEMENTAL CALCIUM) 500 MG chewable tablet Chew 1 tablet by mouth daily. Reported on 05/19/2016    . PRESCRIPTION MEDICATION Take 0.5 tablets by mouth daily. Reported on  05/19/2016    . acetaminophen (TYLENOL) 325 MG tablet Your prescription pain medicine also has Tylenol (acetaminophen.)  You can only take 4000 mg of this per day.  You have to count each prescription pain pill as a Tylenol. (Patient not taking: Reported on 05/19/2016)    . gabapentin (NEURONTIN) 100 MG capsule TAKE 1 CAPSULE BY MOUTH 3 TIMES DAILY (Patient not taking: Reported on 05/19/2016) 90 capsule 3  . oxyCODONE-acetaminophen (PERCOCET/ROXICET) 5-325 MG tablet Take 1-2 tablets by mouth every 4 (four) hours as needed for moderate pain. (Patient not taking: Reported on 05/04/2016) 40 tablet 0  . polyethylene glycol powder (GLYCOLAX/MIRALAX) powder Take 17 g by mouth daily. (Patient not taking: Reported on 05/19/2016) 850 g 1  . senna-docusate (SENOKOT-S) 8.6-50 MG tablet Follow the direction on the package.  You can buy this without a prescription. (Patient not taking: Reported on 05/19/2016)     Facility-Administered Medications Prior to Visit  Medication Dose Route Frequency Provider Last Rate Last Dose  . aspirin tablet 325 mg  325 mg Oral Daily Lance Bosch, NP   325 mg at 09/02/15 1628    ROS Review of Systems  Constitutional: Negative for activity change, appetite change and fatigue.  HENT: Negative for congestion, sinus pressure and sore throat.   Eyes: Negative for visual disturbance.  Respiratory: Negative for cough, chest tightness, shortness of breath and wheezing.   Cardiovascular: Negative for chest pain and palpitations.  Gastrointestinal: Positive  for abdominal pain. Negative for constipation and abdominal distention.  Endocrine: Negative for polydipsia.  Genitourinary: Negative for dysuria and frequency.  Musculoskeletal: Negative for back pain and arthralgias.  Skin: Negative for rash.  Neurological: Positive for headaches. Negative for tremors, light-headedness and numbness.  Hematological: Does not bruise/bleed easily.  Psychiatric/Behavioral: Positive for sleep  disturbance. Negative for behavioral problems and agitation.    Objective:  BP 131/81 mmHg  Pulse 66  Temp(Src) 98.1 F (36.7 C) (Oral)  Resp 20  Ht 5\' 5"  (1.651 m)  Wt 147 lb 12.8 oz (67.042 kg)  BMI 24.60 kg/m2  SpO2 96%  BP/Weight 05/19/2016 05/04/2016 [id number]  Systolic BP [id number]  Diastolic BP 81 77 84  Wt. (Lbs) 147.8 150.2 138  BMI 24.6 29.33 26.95      Physical Exam  Constitutional: She is oriented to person, place, and time. She appears well-developed and well-nourished.  Cardiovascular: Normal rate, normal heart sounds and intact distal pulses.   No murmur heard. Pulmonary/Chest: Effort normal and breath sounds normal. She has no wheezes. She has no rales. She exhibits no tenderness.  Abdominal: Soft. Bowel sounds are normal. She exhibits no distension and no mass. There is tenderness (mild abdominal tenderness which spans across left lower quadrant, suprapubic and right lower quadrant abdomen.).  Musculoskeletal: Normal range of motion.  Neurological: She is alert and oriented to person, place, and time.  Skin: Skin is warm and dry.  Psychiatric: She has a normal mood and affect.     Assessment & Plan:   1. Diabetes mellitus of other type with complication ([place]) Controlled with A1c of 7.2. Diabetic health care maintenance at next visit - Glucose (CBG) - gabapentin (NEURONTIN) 100 MG capsule; Take 1 capsule (100 mg total) by mouth 3 (three) times daily.  Dispense: 90 capsule; Refill: 3  2. Essential hypertension Controlled, low-sodium diet - carvedilol (COREG) 12.5 MG tablet; Take 1 tablet (12.5 mg total) by mouth 2 (two) times daily with a meal.  Dispense: 60 tablet; Refill: 3 - losartan (COZAAR) 100 MG tablet; Take 1 tablet (100 mg total) by mouth daily.  Dispense: 30 tablet; Refill: 4  3. Generalized abdominal pain Unknown etiology Could be secondary to constipation. If symptoms persist will pursue repeating Pap smear, abdominal imaging  4.  Coronary artery disease involving native coronary artery of native heart with angina pectoris (HCC) Stable - carvedilol (COREG) 12.5 MG tablet; Take 1 tablet (12.5 mg total) by mouth 2 (two) times daily with a meal.  Dispense: 60 tablet; Refill: 3  5. Type 2 diabetes mellitus without complication, without long-term current use of insulin (HCC) - metFORMIN (GLUCOPHAGE) 1000 MG tablet; TAKE 1 TABLET BY MOUTH 2 TIMES DAILY WITH A MEAL.  Dispense: 60 tablet; Refill: 3  6. Other constipation - lactulose (CHRONULAC) 10 GM/15ML solution; Take 15 mLs (10 g total) by mouth 3 (three) times daily.  Dispense: 946 mL; Refill: 3  7. Insomnia This could be contributing to the headaches. We'll treat insomnia and reassess at next visit - traZODone (DESYREL) 50 MG tablet; Take 1 tablet (50 mg total) by mouth at bedtime as needed for sleep.  Dispense: 30 tablet; Refill: 3  8. Tension-type headache, not intractable, unspecified chronicity pattern   Meds ordered this encounter  Medications  . lactulose (CHRONULAC) 10 GM/15ML solution    Sig: Take 15 mLs (10 g total) by mouth 3 (three) times daily.    Dispense:  946 mL    Refill:  3  .  traZODone (DESYREL) 50 MG tablet    Sig: Take 1 tablet (50 mg total) by mouth at bedtime as needed for sleep.    Dispense:  30 tablet    Refill:  3  . carvedilol (COREG) 12.5 MG tablet    Sig: Take 1 tablet (12.5 mg total) by mouth 2 (two) times daily with a meal.    Dispense:  60 tablet    Refill:  3  . gabapentin (NEURONTIN) 100 MG capsule    Sig: Take 1 capsule (100 mg total) by mouth 3 (three) times daily.    Dispense:  90 capsule    Refill:  3  . losartan (COZAAR) 100 MG tablet    Sig: Take 1 tablet (100 mg total) by mouth daily.    Dispense:  30 tablet    Refill:  4  . metFORMIN (GLUCOPHAGE) 1000 MG tablet    Sig: TAKE 1 TABLET BY MOUTH 2 TIMES DAILY WITH A MEAL.    Dispense:  60 tablet    Refill:  3    Follow-up: Return in about 3 weeks (around  06/09/2016) for Follow-up of abdominal pain.   Arnoldo Morale MD

## 2016-07-05 MED FILL — ATORVASTATIN 10 MG TABLET: 10 | 30 days supply | Qty: 30 | Fill #0

## 2016-07-05 MED FILL — ?LOSARTAN POTASSIUM 100 MG: 100 | 30 days supply | Qty: 30 | Fill #1

## 2016-07-05 MED FILL — GABAPENTIN 100 MG CAPSULE: 100 | 30 days supply | Qty: 90 | Fill #1

## 2016-07-05 MED FILL — ?METFORMIN HCL 1,000 MG TAB: 1000 | 30 days supply | Qty: 60 | Fill #1

## 2016-07-05 MED FILL — traZODone HCL 50 MG TABS: 50 | 30 days supply | Qty: 30 | Fill #1

## 2016-07-05 MED FILL — ?CARVEDILOL 12.5 MG TABLET: 12.5 | 30 days supply | Qty: 60 | Fill #1

## 2016-07-05 MED FILL — LACTULOSE 10 GM/15 ML SOLN: 10 | 30 days supply | Qty: 946 | Fill #1

## 2016-08-04 ENCOUNTER — Emergency Department (HOSPITAL_COMMUNITY)
Admission: EM | Admit: 2016-08-04 | Discharge: 2016-08-04 | Disposition: A | Discharge status: Home / Self Care | Payer: Self-pay | Attending: Emergency Medicine | Admitting: Emergency Medicine

## 2016-08-04 ENCOUNTER — Emergency Department (HOSPITAL_COMMUNITY): Payer: Self-pay

## 2016-08-04 ENCOUNTER — Encounter (HOSPITAL_COMMUNITY): Payer: Self-pay | Admitting: *Deleted

## 2016-08-04 DIAGNOSIS — S82401A Unspecified fracture of shaft of right fibula, initial encounter for closed fracture: Secondary | ICD-10-CM | POA: Insufficient documentation

## 2016-08-04 DIAGNOSIS — Y999 Unspecified external cause status: Secondary | ICD-10-CM | POA: Insufficient documentation

## 2016-08-04 DIAGNOSIS — Z7984 Long term (current) use of oral hypoglycemic drugs: Secondary | ICD-10-CM | POA: Insufficient documentation

## 2016-08-04 DIAGNOSIS — E1140 Type 2 diabetes mellitus with diabetic neuropathy, unspecified: Secondary | ICD-10-CM | POA: Insufficient documentation

## 2016-08-04 DIAGNOSIS — Z853 Personal history of malignant neoplasm of breast: Secondary | ICD-10-CM | POA: Insufficient documentation

## 2016-08-04 DIAGNOSIS — X509XXA Other and unspecified overexertion or strenuous movements or postures, initial encounter: Secondary | ICD-10-CM | POA: Insufficient documentation

## 2016-08-04 DIAGNOSIS — Y92096 Garden or yard of other non-institutional residence as the place of occurrence of the external cause: Secondary | ICD-10-CM | POA: Insufficient documentation

## 2016-08-04 DIAGNOSIS — Y93H2 Activity, gardening and landscaping: Secondary | ICD-10-CM | POA: Insufficient documentation

## 2016-08-04 DIAGNOSIS — I2510 Atherosclerotic heart disease of native coronary artery without angina pectoris: Secondary | ICD-10-CM | POA: Insufficient documentation

## 2016-08-04 DIAGNOSIS — Z8673 Personal history of transient ischemic attack (TIA), and cerebral infarction without residual deficits: Secondary | ICD-10-CM | POA: Insufficient documentation

## 2016-08-04 DIAGNOSIS — Z7982 Long term (current) use of aspirin: Secondary | ICD-10-CM | POA: Insufficient documentation

## 2016-08-04 DIAGNOSIS — I10 Essential (primary) hypertension: Secondary | ICD-10-CM | POA: Insufficient documentation

## 2016-08-04 MED ORDER — HYDROCODONE-ACETAMINOPHEN 5-325 MG PO TABS: 2.0000 | ORAL_TABLET | ORAL | 0 refills | Status: DC | PRN

## 2016-08-04 MED ORDER — HYDROCODONE-ACETAMINOPHEN 5-325 MG PO TABS: 1.0000 | ORAL_TABLET | Freq: Four times a day (QID) | ORAL | 0 refills | Status: DC | PRN

## 2016-08-04 NOTE — ED Notes (Signed)
PA at bedside.

## 2016-08-04 NOTE — Progress Notes (Signed)
Orthopedic Tech Progress Note Patient Details:  Brandi Bryant 1956-05-21 [id number]  Ortho Devices Type of Ortho Device: Crutches, CAM walker Ortho Device/Splint Location: RLE Ortho Device/Splint Interventions: Ordered, Application   Braulio Bosch 08/04/2016, 11:02 PM

## 2016-08-04 NOTE — ED Triage Notes (Signed)
Patient presents stating she fell last week and again today rolling her right ankle

## 2016-08-04 NOTE — ED Provider Notes (Signed)
[place] DEPT Provider Note   CSN: [id number] Arrival date & time: 08/04/16  1929   By signing my name below, I, Delton Prairie, attest that this documentation has been prepared under the direction and in the presence of  Montine Circle, PA-C. Electronically Signed: Delton Prairie, ED Scribe. 08/04/16. 10:14 PM.   History   Chief Complaint Chief Complaint  Patient presents with  . Ankle Pain    The history is provided by the patient and a relative.   HPI Comments:  Brandi Bryant is a 60 y.o. female who presents to the Emergency Department complaining acute onset, moderate right ankle pain s/p an incident which occurred last week. Pt's relative states the pt was working in the yard when she stepped into a hole and heard a "crack". The pt has also rolled the same ankle today. Pt notes the pain is exacerbated during ambulation. No alleviating factors noted. Pt denies any other complaints at this time.   Past Medical History:  Diagnosis Date  . Abdominal pain   . Arthralgia 08/13/2013  . Bell's palsy   . Breast cancer ([place])   . Chest pain 09/30/2013  . Cholelithiasis   . Colon cancer screening 10/27/2015  . Coronary artery disease   . Diabetes mellitus   . Encounter for screening colonoscopy 10/30/2015  . Hepatic steatosis   . History of breast cancer 06/01/2012  . HTN (hypertension)   . Stroke [facility]) 2006    Patient Active Problem List   Diagnosis Date Noted  . Symptomatic cholelithiasis 12/09/2015  . Encounter for screening colonoscopy 10/30/2015  . Colon cancer screening 10/27/2015  . Bronchitis, acute 09/08/2014  . Palpitations 08/28/2014  . Preventive measure 08/21/2014  . Intractable headache 02/21/2014  . TIA (transient ischemic attack) 02/20/2014  . Trigeminal neuralgia 01/22/2014  . Weakness 01/22/2014  . Bell's palsy 01/21/2014  . UTI (urinary tract infection) 01/21/2014  . Hepatic steatosis 01/21/2014  . Headache 01/20/2014  . Diabetes type 2,  uncontrolled ([place]) 01/20/2014  . Facial droop 01/15/2014  . Stroke ([place]) 01/15/2014  . Type 2 diabetes mellitus with diabetic neuropathy ([place]) 12/06/2013  . Hyperlipidemia 11/07/2013  . CAD (coronary artery disease), native coronary artery 11/07/2013  . Chest pain 09/30/2013  . Arthralgia 08/13/2013  . History of breast cancer 06/01/2012  . Abdominal pain 01/31/2012  . Nausea & vomiting 01/31/2012  . HTN (hypertension)   . Diabetes mellitus ([place])     Past Surgical History:  Procedure Laterality Date  . BREAST LUMPECTOMY  2010  . CHOLECYSTECTOMY N/A 12/09/2015   Procedure: LAPAROSCOPIC CHOLECYSTECTOMY WITH INTRAOPERATIVE CHOLANGIOGRAM;  Surgeon: Greer Pickerel, MD;  Location: [facility];  Service: General;  Laterality: N/A;  . LEFT HEART CATHETERIZATION WITH CORONARY ANGIOGRAM N/A 10/30/2013   Procedure: LEFT HEART CATHETERIZATION WITH CORONARY ANGIOGRAM;  Surgeon: Josue Hector, MD;  Location: [facility] CATH LAB;  Service: Cardiovascular;  Laterality: N/A;  . TUBAL LIGATION      OB History    Gravida Para Term Preterm AB Living   6 6 6     6    SAB TAB Ectopic Multiple Live Births                   Home Medications    Prior to Admission medications   Medication Sig Start Date End Date Taking? Authorizing Provider  albuterol (PROVENTIL HFA;VENTOLIN HFA) 108 (90 BASE) MCG/ACT inhaler Inhale 1-2 puffs into the lungs every 6 (six) hours as needed for wheezing. 01/30/15   Tanna Furry,  MD  aspirin 81 MG tablet Take 81 mg by mouth daily.    Historical Provider, MD  atorvastatin (LIPITOR) 10 MG tablet Take 1 tablet (10 mg total) by mouth daily. Needs office visit for refills 05/04/16   Brayton Caves, PA-C  calcium carbonate (TUMS - DOSED IN MG ELEMENTAL CALCIUM) 500 MG chewable tablet Chew 1 tablet by mouth daily. Reported on 05/19/2016    Historical Provider, MD  carvedilol (COREG) 12.5 MG tablet Take 1 tablet (12.5 mg total) by mouth 2 (two) times daily with a meal. 05/19/16   Arnoldo Morale, MD    gabapentin (NEURONTIN) 100 MG capsule Take 1 capsule (100 mg total) by mouth 3 (three) times daily. 05/19/16   Arnoldo Morale, MD  lactulose (CHRONULAC) 10 GM/15ML solution Take 15 mLs (10 g total) by mouth 3 (three) times daily. 05/19/16   Arnoldo Morale, MD  losartan (COZAAR) 100 MG tablet Take 1 tablet (100 mg total) by mouth daily. 05/19/16   Arnoldo Morale, MD  metFORMIN (GLUCOPHAGE) 1000 MG tablet TAKE 1 TABLET BY MOUTH 2 TIMES DAILY WITH A MEAL. 05/19/16   Arnoldo Morale, MD  pantoprazole (PROTONIX) 40 MG tablet Take 1 tablet (40 mg total) by mouth 2 (two) times daily. 05/04/16   Brayton Caves, PA-C  PRESCRIPTION MEDICATION Take 0.5 tablets by mouth daily. Reported on 05/19/2016    Historical Provider, MD  traZODone (DESYREL) 50 MG tablet Take 1 tablet (50 mg total) by mouth at bedtime as needed for sleep. 05/19/16   Arnoldo Morale, MD    Family History Family History  Problem Relation Age of Onset  . Thyroid cancer Sister   . Hypertension Mother   . Diabetes Mother   . Migraines Daughter     Social History Social History  Substance Use Topics  . Smoking status: Never Smoker  . Smokeless tobacco: Never Used  . Alcohol use No     Allergies   Gadolinium derivatives   Review of Systems Review of Systems  Musculoskeletal: Positive for arthralgias.  Neurological: Negative for numbness.  All other systems reviewed and are negative.    Physical Exam Updated Vital Signs BP 170/82 (BP Location: Left Arm)   Pulse 89   Temp 98.3 F (36.8 C) (Oral)   Resp 18   Wt 147 lb (66.7 kg)   SpO2 97%   BMI 24.46 kg/m   Physical Exam Physical Exam  Constitutional: Pt appears well-developed and well-nourished. No distress.  HENT:  Head: Normocephalic and atraumatic.  Eyes: Conjunctivae are normal.  Neck: Normal range of motion.  Cardiovascular: Normal rate, regular rhythm and intact distal pulses.   Capillary refill < 3 sec  Pulmonary/Chest: Effort normal and breath sounds normal.   Musculoskeletal: Pt exhibits tenderness to palpation over the right lateral ankle, moderate swelling, no obvious deformity.  ROM: of right ankle limited by pain  Neurological: Pt  is alert. Coordination normal.  Sensation 5/5 Strength of right ankle limited by pain  Skin: Skin is warm and dry. Pt is not diaphoretic.  No tenting of the skin  Psychiatric: Pt has a normal mood and affect.  Nursing note and vitals reviewed.   ED Treatments / Results  DIAGNOSTIC STUDIES:  Oxygen Saturation is 97% on RA, normal by my interpretation.    COORDINATION OF CARE:  10:10 PM Discussed treatment plan with pt at bedside and pt agreed to plan.  Labs (all labs ordered are listed, but only abnormal results are displayed) Labs Reviewed - No data  to display  EKG  EKG Interpretation None       Radiology No results found.  Procedures Procedures (including critical care time)  Medications Ordered in ED Medications - No data to display   Initial Impression / Assessment and Plan / ED Course  I have reviewed the triage vital signs and the nursing notes.  Pertinent labs & imaging results that were available during my care of the patient were reviewed by me and considered in my medical decision making (see chart for details).  Clinical Course    Patient with right fibula fracture.  Will treat with camwalker and crutches.  Ortho follow-up.  Final Clinical Impressions(s) / ED Diagnoses   Final diagnoses:  Fibula fracture, right, closed, initial encounter    New Prescriptions Discharge Medication List as of 08/04/2016 10:21 PM    I personally performed the services described in this documentation, which was scribed in my presence. The recorded information has been reviewed and is accurate.       Montine Circle, PA-C 08/05/16 [id number]    Ripley Fraise, MD 08/05/16 2329

## 2016-08-17 ENCOUNTER — Telehealth: Payer: Self-pay | Admitting: Family Medicine

## 2016-08-17 MED FILL — CARVEDILOL 12.5 MG TABLET: 12.5 | 30 days supply | Qty: 60 | Fill #2

## 2016-08-17 MED FILL — ?LOSARTAN POTASSIUM 100 MG: 100 | 30 days supply | Qty: 30 | Fill #2

## 2016-08-17 MED FILL — ?METFORMIN HCL 1,000 MG TAB: 1000 | 30 days supply | Qty: 60 | Fill #2

## 2016-08-17 MED FILL — ATORVASTATIN 10 MG TABLET: 10 | 30 days supply | Qty: 30 | Fill #1

## 2016-08-17 MED FILL — LACTULOSE 10 GM/15 ML SOLN: 10 | 30 days supply | Qty: 946 | Fill #2

## 2016-08-17 MED FILL — PANTOPRAZOLE SOD DR 40 MG T: 40 | 30 days supply | Qty: 30 | Fill #3

## 2016-08-17 MED FILL — traZODone HCL 50 MG TABS: 50 | 30 days supply | Qty: 30 | Fill #2

## 2016-08-17 MED FILL — GABAPENTIN 100 MG CAPSULE: 100 | 30 days supply | Qty: 90 | Fill #2

## 2016-08-17 NOTE — Telephone Encounter (Signed)
Patient would like to know if there is an orthopedic that she can be referred to that offer payment plans. Patient said the one that she attends to now requires her to  Pay prior to appointment  Please follow up.

## 2016-08-17 NOTE — Telephone Encounter (Signed)
I called patient and I give her the information of [place] and also she is going to apply for cafa  Thanks

## 2016-08-26 ENCOUNTER — Ambulatory Visit: Payer: Self-pay

## 2016-09-09 ENCOUNTER — Ambulatory Visit: Payer: Self-pay | Attending: Family Medicine

## 2016-09-12 ENCOUNTER — Other Ambulatory Visit: Payer: Self-pay | Admitting: Cardiology

## 2016-09-12 MED FILL — LACTULOSE 10 GM/15 ML SOLN: 10 | 30 days supply | Qty: 946 | Fill #3

## 2016-09-12 MED FILL — GABAPENTIN 100 MG CAPSULE: 100 | 30 days supply | Qty: 90 | Fill #3

## 2016-09-12 MED FILL — LOSARTAN POTASSIUM 100 MG T: 100 | 30 days supply | Qty: 30 | Fill #3

## 2016-09-12 MED FILL — traZODone HCL 50 MG TABS: 50 | 30 days supply | Qty: 30 | Fill #3

## 2016-09-12 MED FILL — ATORVASTATIN 10 MG TABLET: 10 | 30 days supply | Qty: 30 | Fill #2

## 2016-09-12 MED FILL — metFORMIN HCL 1000 MG TABS: 1000 | 30 days supply | Qty: 60 | Fill #3

## 2016-09-12 MED FILL — CARVEDILOL 12.5 MG TABLET: 12.5 | 30 days supply | Qty: 60 | Fill #3

## 2016-09-29 ENCOUNTER — Ambulatory Visit (INDEPENDENT_AMBULATORY_CARE_PROVIDER_SITE_OTHER): Payer: Self-pay

## 2016-09-29 ENCOUNTER — Ambulatory Visit (INDEPENDENT_AMBULATORY_CARE_PROVIDER_SITE_OTHER): Payer: Self-pay | Admitting: Orthopedic Surgery

## 2016-09-29 ENCOUNTER — Encounter (INDEPENDENT_AMBULATORY_CARE_PROVIDER_SITE_OTHER): Payer: Self-pay | Admitting: Orthopedic Surgery

## 2016-09-29 VITALS — Ht 65.0 in | Wt 147.0 lb

## 2016-09-29 DIAGNOSIS — S93411A Sprain of calcaneofibular ligament of right ankle, initial encounter: Secondary | ICD-10-CM

## 2016-09-29 DIAGNOSIS — M25571 Pain in right ankle and joints of right foot: Secondary | ICD-10-CM

## 2016-09-29 DIAGNOSIS — G8929 Other chronic pain: Secondary | ICD-10-CM

## 2016-09-29 DIAGNOSIS — M79671 Pain in right foot: Secondary | ICD-10-CM

## 2016-09-29 MED ORDER — METHYLPREDNISOLONE ACETATE 40 MG/ML IJ SUSP
40.0000 mg | INTRAMUSCULAR | Status: AC | PRN
  Administered 2016-09-29: 40 mg via INTRA_ARTICULAR

## 2016-09-29 MED ORDER — LIDOCAINE HCL 1 % IJ SOLN
2.0000 mL | INTRAMUSCULAR | Status: AC | PRN
  Administered 2016-09-29: 2 mL

## 2016-09-29 NOTE — Progress Notes (Signed)
Office Visit Note   Patient: Brandi Bryant           Date of Birth: 1956-02-12           MRN: [id number] Visit Date: 09/29/2016              Requested by: Arnoldo Morale, MD [place], [place] [address] PCP: Arnoldo Morale, MD   Assessment & Plan: Visit Diagnoses:  1. Chronic pain of right ankle   2. Pain in right foot   3. Sprain of calcaneofibular ligament of right ankle, initial encounter   Impingement right ankle  Plan: Patient symptomatically has impingement syndrome of the right ankle status post ankle sprain several months ago. Patient is currently walking in a fracture boot with crutches and states the boot makes it feel worse. Ankle was injected following the anterior medial portal with 1 mL that metal 40 mg and 5 mL 1% lidocaine plain she tolerated this well she will advance to regular shoewear follow-up in the office in 4 weeks.  Follow-Up Instructions: Return in about 4 weeks (around 10/27/2016).   Orders:  Orders Placed This Encounter  Procedures  . Medium Joint Injection/Arthrocentesis  . XR Foot Complete Right   No orders of the defined types were placed in this encounter.     Procedures: Medium Joint Inj Date/Time: 09/29/2016 1:25 PM Performed by: Mariana Arn L Authorized by: Newt Minion   Consent Given by:  Patient Site marked: the procedure site was marked   Timeout: prior to procedure the correct patient, procedure, and site was verified   Indications:  Pain and diagnostic evaluation Location:  Ankle Site:  R ankle Prep: patient was prepped and draped in usual sterile fashion   Needle Size:  22 G Needle Length:  1.5 inches Ultrasound Guided: No   Fluoroscopic Guidance: No   Medications:  2 mL lidocaine 1 %; 40 mg methylPREDNISolone acetate 40 MG/ML Aspiration Attempted: No   Patient tolerance:  Patient tolerated the procedure well with no immediate complications      Clinical Data: No additional  findings.   Subjective: Chief Complaint  Patient presents with  . Right Ankle - Pain, Injury    DOI second week of September    Xrays in September following a twist injury after stepping in a hole in the yard and hearing a loud pop. Pt is weight bearing with crutches and a fracture boot. X rays in September show a probable nondisplaced fib tip fracture. And pt is still complaining of pain of the lateral side right ankle.    Injury     Review of Systems   Objective: Vital Signs: Ht 5\' 5"  (1.651 m)   Wt 147 lb (66.7 kg)   BMI 24.46 kg/m   Physical Exam patient states that she sprained her ankle in the yard about 2 months ago and is still symptomatic laterally as well as anteriorly over the ankle. Examination the subtalar joint is nontender to palpation anterior drawer stable proximal tibia and fibula and syndesmosis are nontender to palpation. She has good pulses. Anterior drawer is stable. She is nicely tender to palpation anteriorly over the ankle as well as over the lateral ankle ligaments.  Ortho Exam  Specialty Comments:  No specialty comments available.  Imaging: Xr Foot Complete Right  Result Date: 09/29/2016 Two-view radiographs obtained of the right foot shows no bony abnormalities no evidence of a fracture. He mortise is congruent. No signs of osteochondral defect  PMFS History: Patient Active Problem List   Diagnosis Date Noted  . Symptomatic cholelithiasis 12/09/2015  . Encounter for screening colonoscopy 10/30/2015  . Colon cancer screening 10/27/2015  . Bronchitis, acute 09/08/2014  . Palpitations 08/28/2014  . Preventive measure 08/21/2014  . Intractable headache 02/21/2014  . TIA (transient ischemic attack) 02/20/2014  . Trigeminal neuralgia 01/22/2014  . Weakness 01/22/2014  . Bell's palsy 01/21/2014  . UTI (urinary tract infection) 01/21/2014  . Hepatic steatosis 01/21/2014  . Headache 01/20/2014  . Diabetes type 2, uncontrolled ([place])  01/20/2014  . Facial droop 01/15/2014  . Stroke ([place]) 01/15/2014  . Type 2 diabetes mellitus with diabetic neuropathy ([place]) 12/06/2013  . Hyperlipidemia 11/07/2013  . CAD (coronary artery disease), native coronary artery 11/07/2013  . Chest pain 09/30/2013  . Arthralgia 08/13/2013  . History of breast cancer 06/01/2012  . Abdominal pain 01/31/2012  . Nausea & vomiting 01/31/2012  . HTN (hypertension)   . Diabetes mellitus ([place])    Past Medical History:  Diagnosis Date  . Abdominal pain   . Arthralgia 08/13/2013  . Bell's palsy   . Breast cancer ([place])   . Chest pain 09/30/2013  . Cholelithiasis   . Colon cancer screening 10/27/2015  . Coronary artery disease   . Diabetes mellitus   . Encounter for screening colonoscopy 10/30/2015  . Hepatic steatosis   . History of breast cancer 06/01/2012  . HTN (hypertension)   . Stroke [facility]) 2006    Family History  Problem Relation Age of Onset  . Thyroid cancer Sister   . Hypertension Mother   . Diabetes Mother   . Migraines Daughter     Past Surgical History:  Procedure Laterality Date  . BREAST LUMPECTOMY  2010  . CHOLECYSTECTOMY N/A 12/09/2015   Procedure: LAPAROSCOPIC CHOLECYSTECTOMY WITH INTRAOPERATIVE CHOLANGIOGRAM;  Surgeon: Greer Pickerel, MD;  Location: [facility];  Service: General;  Laterality: N/A;  . LEFT HEART CATHETERIZATION WITH CORONARY ANGIOGRAM N/A 10/30/2013   Procedure: LEFT HEART CATHETERIZATION WITH CORONARY ANGIOGRAM;  Surgeon: Josue Hector, MD;  Location[facility] C[facility] LAB;  Service: Cardiovascular;  Laterality: N/A;  . TUBAL LIGATION     Social History   Occupational History  . Not on file.   Social History Main Topics  . Smoking status: Never Smoker  . Smokeless tobacco: Never Used  . Alcohol use No  . Drug use: No  . Sexual activity: Not Currently    Birth control/ protection: Surgical

## 2016-10-19 ENCOUNTER — Telehealth: Payer: Self-pay | Admitting: Family Medicine

## 2016-10-19 DIAGNOSIS — G4700 Insomnia, unspecified: Secondary | ICD-10-CM

## 2016-10-19 DIAGNOSIS — E119 Type 2 diabetes mellitus without complications: Secondary | ICD-10-CM

## 2016-10-19 MED ORDER — METFORMIN HCL 1000 MG PO TABS: ORAL_TABLET | ORAL | 0 refills | Status: DC

## 2016-10-19 MED ORDER — TRAZODONE HCL 50 MG PO TABS: 50.0000 mg | ORAL_TABLET | Freq: Every evening | ORAL | 0 refills | Status: DC | PRN

## 2016-10-19 MED ORDER — GABAPENTIN 100 MG PO CAPS: 100.0000 mg | ORAL_CAPSULE | Freq: Three times a day (TID) | ORAL | 0 refills | Status: DC

## 2016-10-19 NOTE — Telephone Encounter (Signed)
Patient's daughter called the office to request refills for mother's medication:  traZODone (DESYREL) 50 MG tablet  metFORMIN (GLUCOPHAGE) 1000 MG tablet  gabapentin (NEURONTIN) 100 MG capsule. Pt's daughter is aware patient needs to come in for appt but there's no availability.   Thank you.

## 2016-10-19 NOTE — Telephone Encounter (Signed)
Requested medications refilled - patient needs to schedule appt when available.

## 2016-10-21 ENCOUNTER — Encounter (HOSPITAL_COMMUNITY): Payer: Self-pay | Admitting: Emergency Medicine

## 2016-10-21 ENCOUNTER — Ambulatory Visit (HOSPITAL_COMMUNITY)
Admission: EM | Admit: 2016-10-21 | Discharge: 2016-10-21 | Disposition: A | Discharge status: Home / Self Care | Payer: Self-pay | Attending: Family Medicine | Admitting: Family Medicine

## 2016-10-21 DIAGNOSIS — G4700 Insomnia, unspecified: Secondary | ICD-10-CM

## 2016-10-21 DIAGNOSIS — R42 Dizziness and giddiness: Secondary | ICD-10-CM

## 2016-10-21 DIAGNOSIS — E119 Type 2 diabetes mellitus without complications: Secondary | ICD-10-CM

## 2016-10-21 DIAGNOSIS — R059 Cough, unspecified: Secondary | ICD-10-CM

## 2016-10-21 DIAGNOSIS — R0789 Other chest pain: Secondary | ICD-10-CM

## 2016-10-21 DIAGNOSIS — R05 Cough: Secondary | ICD-10-CM

## 2016-10-21 DIAGNOSIS — J40 Bronchitis, not specified as acute or chronic: Secondary | ICD-10-CM

## 2016-10-21 DIAGNOSIS — I10 Essential (primary) hypertension: Secondary | ICD-10-CM

## 2016-10-21 MED ORDER — GABAPENTIN 100 MG PO CAPS: 100.0000 mg | ORAL_CAPSULE | Freq: Three times a day (TID) | ORAL | 0 refills | Status: DC

## 2016-10-21 MED ORDER — AZITHROMYCIN 250 MG PO TABS: 250.0000 mg | ORAL_TABLET | Freq: Every day | ORAL | 0 refills | Status: DC

## 2016-10-21 MED ORDER — METFORMIN HCL 1000 MG PO TABS: ORAL_TABLET | ORAL | 0 refills | Status: DC

## 2016-10-21 MED ORDER — TRAZODONE HCL 50 MG PO TABS: 50.0000 mg | ORAL_TABLET | Freq: Every evening | ORAL | 0 refills | Status: DC | PRN

## 2016-10-21 MED ORDER — METHYLPREDNISOLONE 4 MG PO TBPK: ORAL_TABLET | ORAL | 0 refills | Status: DC

## 2016-10-21 MED ORDER — ATORVASTATIN CALCIUM 10 MG PO TABS: 10.0000 mg | ORAL_TABLET | Freq: Every day | ORAL | 0 refills | Status: DC

## 2016-10-21 MED ORDER — MECLIZINE HCL 25 MG PO TABS: 25.0000 mg | ORAL_TABLET | Freq: Three times a day (TID) | ORAL | 0 refills | Status: DC | PRN

## 2016-10-21 MED ORDER — BENZONATATE 100 MG PO CAPS: 100.0000 mg | ORAL_CAPSULE | Freq: Three times a day (TID) | ORAL | 0 refills | Status: DC

## 2016-10-21 MED ORDER — LOSARTAN POTASSIUM 100 MG PO TABS: 100.0000 mg | ORAL_TABLET | Freq: Every day | ORAL | 4 refills | Status: DC

## 2016-10-21 NOTE — ED Triage Notes (Addendum)
The patient presented to the [facility] with a complaint of chest pain x 3 days. The patient reported that she has been having recurrent chest pain in the center of her chest that does not radiate and she described it as sharp. The patient reported a headache when this happened. The patient stated that this am prior to coming to the [facility] she had an episode of pain but denied any pain at this time and did not appear in any distress. Family member translated.   After triage the patient advised that her reason for the visit today was her PCP was unable to see her today and she needed refills on her gabapentin, metformin, trazodone and protonix.........Marland Kitchen

## 2016-10-21 NOTE — ED Provider Notes (Signed)
CSN: [id number]     Arrival date & time 10/21/16  1103 History   None    Chief Complaint  Patient presents with  . Chest Pain   (Consider location/radiation/quality/duration/timing/severity/associated sxs/prior Treatment)  Patient c/o chest pain x 3 days.  She has not been taking bp medications   The history is provided by the patient.  Chest Pain  Pain location:  Substernal area and L chest Pain quality: aching   Pain radiates to:  Does not radiate Pain severity:  Moderate Onset quality:  Sudden Duration:  3 days Timing:  Constant Progression:  Waxing and waning Chronicity:  New Context: at rest   Relieved by:  Nothing Worsened by:  Nothing Ineffective treatments:  None tried   Past Medical History:  Diagnosis Date  . Abdominal pain   . Arthralgia 08/13/2013  . Bell's palsy   . Breast cancer ([place])   . Chest pain 09/30/2013  . Cholelithiasis   . Colon cancer screening 10/27/2015  . Coronary artery disease   . Diabetes mellitus   . Encounter for screening colonoscopy 10/30/2015  . Hepatic steatosis   . History of breast cancer 06/01/2012  . HTN (hypertension)   . Stroke [facility]) 2006   Past Surgical History:  Procedure Laterality Date  . BREAST LUMPECTOMY  2010  . CHOLECYSTECTOMY N/A 12/09/2015   Procedure: LAPAROSCOPIC CHOLECYSTECTOMY WITH INTRAOPERATIVE CHOLANGIOGRAM;  Surgeon: Greer Pickerel, MD;  Location: [facility];  Service: General;  Laterality: N/A;  . LEFT HEART CATHETERIZATION WITH CORONARY ANGIOGRAM N/A 10/30/2013   Procedure: LEFT HEART CATHETERIZATION WITH CORONARY ANGIOGRAM;  Surgeon: Josue Hector, MD;  Location: [facility] LAB;  Service: Cardiovascular;  Laterality: N/A;  . TUBAL LIGATION     Family History  Problem Relation Age of Onset  . Thyroid cancer Sister   . Hypertension Mother   . Diabetes Mother   . Migraines Daughter    Social History  Substance Use Topics  . Smoking status: Never Smoker  . Smokeless tobacco: Never Used  . Alcohol use No    OB History    Gravida Para Term Preterm AB Living   6 6 6     6    SAB TAB Ectopic Multiple Live Births                 Review of Systems  Constitutional: Negative.   HENT: Negative.   Eyes: Negative.   Cardiovascular: Positive for chest pain.  Gastrointestinal: Negative.   Endocrine: Negative.   Genitourinary: Negative.   Musculoskeletal: Negative.   Skin: Negative.   Allergic/Immunologic: Negative.   Neurological: Negative.   Hematological: Negative.   Psychiatric/Behavioral: Negative.     Allergies  Gadolinium derivatives  Home Medications   Prior to Admission medications   Medication Sig Start Date End Date Taking? Authorizing Provider  albuterol (PROVENTIL HFA;VENTOLIN HFA) 108 (90 BASE) MCG/ACT inhaler Inhale 1-2 puffs into the lungs every 6 (six) hours as needed for wheezing. 01/30/15   Tanna Furry, MD  aspirin 81 MG tablet Take 81 mg by mouth daily.    Historical Provider, MD  atorvastatin (LIPITOR) 10 MG tablet Take 1 tablet (10 mg total) by mouth daily. Needs office visit for refills 10/21/16   Lysbeth Penner, FNP  azithromycin (ZITHROMAX) 250 MG tablet Take 1 tablet (250 mg total) by mouth daily. Take first 2 tablets together, then 1 every day until finished. 10/21/16   Lysbeth Penner, FNP  benzonatate (TESSALON) 100 MG capsule Take 1 capsule (100 mg  total) by mouth every 8 (eight) hours. 10/21/16   Lysbeth Penner, FNP  calcium carbonate (TUMS - DOSED IN MG ELEMENTAL CALCIUM) 500 MG chewable tablet Chew 1 tablet by mouth daily. Reported on 05/19/2016    Historical Provider, MD  carvedilol (COREG) 12.5 MG tablet Take 1 tablet (12.5 mg total) by mouth 2 (two) times daily with a meal. 05/19/16   Arnoldo Morale, MD  gabapentin (NEURONTIN) 100 MG capsule Take 1 capsule (100 mg total) by mouth 3 (three) times daily. 10/21/16   Lysbeth Penner, FNP  HYDROcodone-acetaminophen (NORCO/VICODIN) 5-325 MG tablet Take 1-2 tablets by mouth every 6 (six) hours as needed. Patient not  taking: Reported on 09/29/2016 08/04/16   Montine Circle, PA-C  lactulose (CHRONULAC) 10 GM/15ML solution Take 15 mLs (10 g total) by mouth 3 (three) times daily. 05/19/16   Arnoldo Morale, MD  losartan (COZAAR) 100 MG tablet Take 1 tablet (100 mg total) by mouth daily. 10/21/16   Lysbeth Penner, FNP  meclizine (ANTIVERT) 25 MG tablet Take 1 tablet (25 mg total) by mouth 3 (three) times daily as needed for dizziness. 10/21/16   Lysbeth Penner, FNP  metFORMIN (GLUCOPHAGE) 1000 MG tablet TAKE 1 TABLET BY MOUTH 2 TIMES DAILY WITH A MEAL. 10/21/16   Lysbeth Penner, FNP  methylPREDNISolone (MEDROL DOSEPAK) 4 MG TBPK tablet Take 6-5-4-3-2-1 po qd 10/21/16   Lysbeth Penner, FNP  pantoprazole (PROTONIX) 40 MG tablet Take 1 tablet (40 mg total) by mouth 2 (two) times daily. 05/04/16   Brayton Caves, PA-C  PRESCRIPTION MEDICATION Take 0.5 tablets by mouth daily. Reported on 05/19/2016    Historical Provider, MD  traZODone (DESYREL) 50 MG tablet Take 1 tablet (50 mg total) by mouth at bedtime as needed for sleep. 10/21/16   Lysbeth Penner, FNP   Meds Ordered and Administered this Visit  Medications - No data to display  BP 144/62 (BP Location: Left Arm)   Pulse 69   Temp 98 F (36.7 C) (Oral)   Resp 16   SpO2 97%  No data found.   Physical Exam  Constitutional: She is oriented to person, place, and time. She appears well-developed and well-nourished.  HENT:  Head: Normocephalic and atraumatic.  Right Ear: External ear normal.  Left Ear: External ear normal.  Mouth/Throat: Oropharynx is clear and moist.  Eyes: EOM are normal. Pupils are equal, round, and reactive to light.  Neck: Normal range of motion. Neck supple.  Cardiovascular: Normal rate and regular rhythm.   Pulmonary/Chest: Effort normal and breath sounds normal.  Abdominal: Soft. Bowel sounds are normal.  Neurological: She is alert and oriented to person, place, and time.  Vitals reviewed.   Urgent Care Course   Clinical Course      Procedures (including critical care time)  Labs Review Labs Reviewed - No data to display  Imaging Review No results found.   Visual Acuity Review  Right Eye Distance:   Left Eye Distance:   Bilateral Distance:    Right Eye Near:   Left Eye Near:    Bilateral Near:         MDM   1. Vertigo   2. Bronchitis   3. Cough   4. Chest wall pain   5. Type 2 diabetes mellitus without complication, without long-term current use of insulin ([place])   6. Essential hypertension   7. Insomnia, unspecified type        Lysbeth Penner, FNP 10/21/16 1700

## 2016-10-27 ENCOUNTER — Ambulatory Visit (HOSPITAL_BASED_OUTPATIENT_CLINIC_OR_DEPARTMENT_OTHER): Payer: No Typology Code available for payment source | Admitting: Adult Health

## 2016-10-27 ENCOUNTER — Ambulatory Visit (HOSPITAL_BASED_OUTPATIENT_CLINIC_OR_DEPARTMENT_OTHER): Payer: No Typology Code available for payment source

## 2016-10-27 ENCOUNTER — Telehealth: Payer: Self-pay | Admitting: Emergency Medicine

## 2016-10-27 ENCOUNTER — Encounter: Payer: Self-pay | Admitting: Cardiology

## 2016-10-27 ENCOUNTER — Ambulatory Visit (INDEPENDENT_AMBULATORY_CARE_PROVIDER_SITE_OTHER): Payer: Self-pay | Admitting: Orthopedic Surgery

## 2016-10-27 VITALS — BP 150/69 | HR 79 | Temp 98.6°F | Resp 18 | Ht 65.0 in | Wt 148.4 lb

## 2016-10-27 DIAGNOSIS — N390 Urinary tract infection, site not specified: Secondary | ICD-10-CM

## 2016-10-27 DIAGNOSIS — Z170 Estrogen receptor positive status [ER+]: Principal | ICD-10-CM

## 2016-10-27 DIAGNOSIS — R300 Dysuria: Secondary | ICD-10-CM

## 2016-10-27 DIAGNOSIS — C50312 Malignant neoplasm of lower-inner quadrant of left female breast: Secondary | ICD-10-CM

## 2016-10-27 DIAGNOSIS — Z853 Personal history of malignant neoplasm of breast: Secondary | ICD-10-CM

## 2016-10-27 DIAGNOSIS — R319 Hematuria, unspecified: Secondary | ICD-10-CM

## 2016-10-27 DIAGNOSIS — R079 Chest pain, unspecified: Secondary | ICD-10-CM

## 2016-10-27 LAB — URINALYSIS, MICROSCOPIC - CHCC
Bilirubin (Urine): NEGATIVE
GLUCOSE UR CHCC: 500 mg/dL
Ketones: NEGATIVE mg/dL
NITRITE: POSITIVE
PROTEIN: NEGATIVE mg/dL
SPECIFIC GRAVITY, URINE: 1.015 (ref 1.003–1.035)
UROBILINOGEN UR: 0.2 mg/dL (ref 0.2–1)
pH: 6 (ref 4.6–8.0)

## 2016-10-27 MED ORDER — SULFAMETHOXAZOLE-TRIMETHOPRIM 800-160 MG PO TABS: 1.0000 | ORAL_TABLET | Freq: Two times a day (BID) | ORAL | 0 refills | Status: DC

## 2016-10-27 MED FILL — SULFAMETHOXAZOLE/TMP DS TAB: 800-160 | 7 days supply | Qty: 14 | Fill #0

## 2016-10-27 NOTE — Telephone Encounter (Signed)
Spoke with Gregary Signs the interpreter for [facility]; instructed her to call patient with instructions on urine results and antibiotic for patient to take for 7 days. Also advised her to notify patient that she had large glucose in her urine and she will need to f/u with her PCP for this.

## 2016-10-27 NOTE — Progress Notes (Signed)
CLINIC:  Survivorship   REASON FOR VISIT:  Routine follow-up for history of breast cancer.   BRIEF ONCOLOGIC HISTORY:  (From Dr. Calton Dach last note on 10/27/15)    INTERVAL HISTORY:  Ms. Headlee presents to the [facility] today for routine follow-up for her history of breast cancer.    Her biggest concerns today are intermittent chest pain; she tells me there was miscommunication with her cardiology office and she missed her follow-up appointment there in November.  She has had several ED/urgent care visits since her last visit to the cancer center.    -11/19/15-ED visit for lower abdominal pain -12/08/15-12/11/15-Hospitalization for epigastric pain, which was likely symptomatic cholelithiasis, and her gallbladder was subsequently removed.  -08/04/16-ED visit for right fibula/ankle fracture  -10/21/16-Urgent Care visit for vertigo   At the most recent Urgent Care visit about 6 days ago, she reported having chest pain at that time and was found to have bronchitis, where she was treated with antibiotics, anti-tussives, meclizine, and steroids.  She still has a slight cough, but it is improving.   She endorses right leg pain and ankle swelling, since her fracture about 3 months ago.  She has right leg pain when in a seated position. Her legs swell worse when she is in a seated position.   She reports pain with urination, urinary incontinence, and urine odor x 3 months.      REVIEW OF SYSTEMS:  Review of Systems  Constitutional: Negative.   HENT:  Negative.   Eyes: Negative.   Cardiovascular: Positive for chest pain and leg swelling (Right leg only).  Endocrine: Positive for hot flashes.  Genitourinary: Positive for bladder incontinence and dysuria (Burning and odor with urination for the past 3 months). Negative for hematuria and vaginal bleeding.   Musculoskeletal: Positive for arthralgias.  Neurological: Positive for headaches.  Hematological: Negative.     Psychiatric/Behavioral: Positive for depression.  Breast: Denies any new nodularity, masses, nipple changes, or nipple discharge.    A 14-point review of systems was completed and was negative, except as noted above.    PAST MEDICAL/SURGICAL HISTORY:  Past Medical History:  Diagnosis Date  . Abdominal pain   . Arthralgia 08/13/2013  . Bell's palsy   . Breast cancer ([place])   . Chest pain 09/30/2013  . Cholelithiasis   . Colon cancer screening 10/27/2015  . Coronary artery disease   . Diabetes mellitus   . Encounter for screening colonoscopy 10/30/2015  . Hepatic steatosis   . History of breast cancer 06/01/2012  . HTN (hypertension)   . Stroke [facility]) 2006   Past Surgical History:  Procedure Laterality Date  . BREAST LUMPECTOMY  2010  . CHOLECYSTECTOMY N/A 12/09/2015   Procedure: LAPAROSCOPIC CHOLECYSTECTOMY WITH INTRAOPERATIVE CHOLANGIOGRAM;  Surgeon: Greer Pickerel, MD;  Location: [place];  Service: General;  Laterality: N/A;  . LEFT HEART CATHETERIZATION WITH CORONARY ANGIOGRAM N/A 10/30/2013   Procedure: LEFT HEART CATHETERIZATION WITH CORONARY ANGIOGRAM;  Surgeon: Josue Hector, MD;  Location: [facility] CATH LAB;  Service: Cardiovascular;  Laterality: N/A;  . TUBAL LIGATION       ALLERGIES:  Allergies  Allergen Reactions  . Gadolinium Derivatives     Code: VOM, Desc: Pt began vomiting 45 sec after MRI contrast injection of Multihance, Onset Date: [id number]       CURRENT MEDICATIONS:  Outpatient Encounter Prescriptions as of 10/27/2016  Medication Sig  . albuterol (PROVENTIL HFA;VENTOLIN HFA) 108 (90 BASE) MCG/ACT inhaler Inhale 1-2 puffs into the lungs every  6 (six) hours as needed for wheezing.  Marland Kitchen aspirin 81 MG tablet Take 81 mg by mouth daily.  Marland Kitchen atorvastatin (LIPITOR) 10 MG tablet Take 1 tablet (10 mg total) by mouth daily. Needs office visit for refills  . azithromycin (ZITHROMAX) 250 MG tablet Take 1 tablet (250 mg total) by mouth daily. Take first 2 tablets together, then  1 every day until finished.  . benzonatate (TESSALON) 100 MG capsule Take 1 capsule (100 mg total) by mouth every 8 (eight) hours.  . calcium carbonate (TUMS - DOSED IN MG ELEMENTAL CALCIUM) 500 MG chewable tablet Chew 1 tablet by mouth daily. Reported on 05/19/2016  . carvedilol (COREG) 12.5 MG tablet Take 1 tablet (12.5 mg total) by mouth 2 (two) times daily with a meal.  . gabapentin (NEURONTIN) 100 MG capsule Take 1 capsule (100 mg total) by mouth 3 (three) times daily.  Marland Kitchen lactulose (CHRONULAC) 10 GM/15ML solution Take 15 mLs (10 g total) by mouth 3 (three) times daily.  Marland Kitchen losartan (COZAAR) 100 MG tablet Take 1 tablet (100 mg total) by mouth daily.  . meclizine (ANTIVERT) 25 MG tablet Take 1 tablet (25 mg total) by mouth 3 (three) times daily as needed for dizziness.  . metFORMIN (GLUCOPHAGE) 1000 MG tablet TAKE 1 TABLET BY MOUTH 2 TIMES DAILY WITH A MEAL.  . methylPREDNISolone (MEDROL DOSEPAK) 4 MG TBPK tablet Take 6-5-4-3-2-1 po qd  . pantoprazole (PROTONIX) 40 MG tablet Take 1 tablet (40 mg total) by mouth 2 (two) times daily.  Marland Kitchen PRESCRIPTION MEDICATION Take 0.5 tablets by mouth daily. Reported on 05/19/2016  . traZODone (DESYREL) 50 MG tablet Take 1 tablet (50 mg total) by mouth at bedtime as needed for sleep.  . [DISCONTINUED] HYDROcodone-acetaminophen (NORCO/VICODIN) 5-325 MG tablet Take 1-2 tablets by mouth every 6 (six) hours as needed. (Patient not taking: Reported on 09/29/2016)   Facility-Administered Encounter Medications as of 10/27/2016  Medication  . aspirin tablet 325 mg     ONCOLOGIC FAMILY HISTORY:  Family History  Problem Relation Age of Onset  . Thyroid cancer Sister   . Hypertension Mother   . Diabetes Mother   . Migraines Daughter     GENETIC COUNSELING/TESTING: No records available for review.   SOCIAL HISTORY:  Khamil Lamica lives in [place], [place]. She has 2 children-1 daughter and 1 son.  She denies any current tobacco, alcohol, or illicit drug  use.     PHYSICAL EXAMINATION:  Vital Signs: Vitals:   10/27/16 1012  BP: (!) 150/69  Pulse: 79  Resp: 18  Temp: 98.6 F (37 C)   Filed Weights   10/27/16 1012  Weight: 148 lb 6.4 oz (67.3 kg)   General: Well-nourished, well-appearing female in no acute distress.  Accompanied by her daughter and grandson today.   HEENT: Head is normocephalic.  Pupils equal and reactive to light. Conjunctivae clear without exudate.  Sclerae anicteric. Oral mucosa is pink, moist.  Oropharynx is pink without lesions or erythema.  Lymph: No cervical, supraclavicular, or infraclavicular lymphadenopathy noted on palpation.  Cardiovascular: Regular rate and rhythm. No JVD.  Respiratory: Clear to auscultation bilaterally. Chest expansion symmetric; breathing non-labored.  Breast Exam:  -Left breast: No appreciable masses on palpation. No skin redness, thickening, or peau d'orange appearance; mild distortion in symmetry at previous lumpectomy site; healed scar without erythema or nodularity. Palpable post-radiation fibrosis/expected post-treatment breast changes.  -Right breast: No appreciable masses on palpation. No skin redness, thickening, or peau d'orange appearance; no nipple retraction or nipple  discharge. -Axilla: No axillary adenopathy bilaterally.  GI: Abdomen soft and round; non-tender, non-distended. Bowel sounds normoactive. No hepatosplenomegaly.  (+) suprapubic tenderness on palpation.  GU: Deferred.  Neuro: No focal deficits. Steady gait.  Psych: Mood and affect normal and appropriate for situation.  Extremities: No edema. Skin: Warm and dry.  LABORATORY DATA:  Urinalysis today, 10/27/16   Urine culture pending    DIAGNOSTIC IMAGING:  Most recent mammogram: 11/06/15    ASSESSMENT AND PLAN:  Ms.. Vejar is a pleasant 60 y.o. female with history of left breast invasive ductal carcinoma, ER+/PR-/HER2-, diagnosed in 2010;  treated with neoadjuvant chemo with Taxotere/Cytoxan,  followed by lumpectomy and adjuvant radiation therapy. She began anti-estrogen therapy with Tamoxifen in 06/2009; she remained on Tamoxifen until 01/2013 and was switched to Femara as she was post-menopausal at that time.  She remained on Femara until 08/2014, at which point anti-estrogen therapy was stopped after completed 5 total years of therapy. She [facility] to the [facility] for surveillance and routine follow-up.   1. History of left breast cancer:  Ms. Gioffre is currently clinically without evidence of disease or recurrence of breast cancer. She has not had her mammogram for 2017; she will need to have her mammograms done through the [facility] ([facility]erv[facility]). We will make a referral back to [facility] to get this taken care of for the patient. Benjamine Sprague, Spanish interpreter will help facilitate this appointment for the patient. She will return to the cancer center to see Survivorship NP in 10/2017 for continued surveillance.  I encouraged her to call me with any questions or concerns before her next visit at the cancer center, and I would be happy to see her sooner, if needed.    2. Chest pain with shortness of breath: Ms. Laham has been having intermittent chest pain with shortness of breath for the past several months.  These episodes generally occur every other day or every 2 days. Denies diaphoresis or referred pain with the chest pain.  She does have a history of acid reflux as well.  She describes the discomfort as "deep inside pain."  The pain is just medial to her previous left breast lumpectomy scar and could certainly be chronic mastalgia pain or chronic neuropathic pain secondary to her lumpectomy surgery.  From a cardiac standpoint today, her blood pressure was mildly elevated, but largely stable.  Cardiac exam benign and she denies chest pain at present today.  However, given that the pain has been persistant and she recently missed her  cardiology appointment, I will re-refer her to our [facility] clinic for further evaluation.    3. Right leg pain and swelling:  Ms. Stum did have a right ankle fracture about 3 months ago. She reports having intermittent pain and swelling to the right leg and foot.  She expressed concern about this pain and edema being related to her previous history of taking anti-estrogen therapy for 5 years.  We reviewed that she was on Tamoxifen for about 3.5 years, which can actually strengthen bone density. She was on Femara, an aromatase inhibitor, that can weaken bones for about 1.5 years. Certainly anti-estrogen therapies can cause joint aches/pains, as well as worsen pre-existing arthritis-type pains. She completed anti-estrogen therapy in 08/2014 and is unlikely to be the cause of her right leg pain.  She could certainly be experiencing arthritis to her joints with edema as well.  She has no calf swelling or pain on exam to suggest DVT.  I recommended that she try to rest the leg and elevate it while sitting to decrease what appears to be dependent edema.  I also suggested that she talk with the cardiologist when she sees them to get their recommendations for possible vascular testing or further referrals.  I defer to their judgment on how best to manage her concerns, along with her PCP, as I do not think her leg pain or mild edema is related to her history of breast cancer.   4. Dysuria x 3 months: Her urinalysis is suggestive of UTI with positive nitrite, small leukocyte esterase, WBC 7-10, many bacteria, and trace blood.  I will treat her empirically with Bactrim DS twice daily x 7 days, given how long she has been experiencing symptoms. If her urine culture shows resistance to Bactrim, then we will change antibiotic therapy at that time.    5. Significant glucosuria: Urinalysis today showed 500 mg/dL of glucose. Given her history of diabetes, this is concerning.  Recommended that she follow-up with  her PCP as soon as possible for further evaluation.       Dispo:  -Antibiotics for UTI; Refer back to PCP for significant glucosuria.  -Referral to cardiology as soon as they have availability to evaluate chest pain with shortness of breath -Refer back to [facility] program for mammogram.  -Return to cancer center to see Survivorship NP in 10/2017   A total of 30 minutes of face-to-face time was spent with this patient with greater than 50% of that time in counseling and care-coordination.   Mike Craze, NP Survivorship Program [facility] [phone]   Note: PRIMARY CARE PROVIDER Arnoldo Morale, [place] [phone]

## 2016-10-29 LAB — URINE CULTURE

## 2016-10-31 ENCOUNTER — Ambulatory Visit (INDEPENDENT_AMBULATORY_CARE_PROVIDER_SITE_OTHER): Payer: Self-pay | Admitting: Orthopedic Surgery

## 2016-10-31 DIAGNOSIS — M25871 Other specified joint disorders, right ankle and foot: Secondary | ICD-10-CM | POA: Insufficient documentation

## 2016-10-31 DIAGNOSIS — M7751 Other enthesopathy of right foot: Secondary | ICD-10-CM

## 2016-10-31 DIAGNOSIS — E1142 Type 2 diabetes mellitus with diabetic polyneuropathy: Secondary | ICD-10-CM | POA: Insufficient documentation

## 2016-10-31 MED ORDER — LIDOCAINE HCL 1 % IJ SOLN
2.0000 mL | INTRAMUSCULAR | Status: AC | PRN
  Administered 2016-10-31: 2 mL

## 2016-10-31 MED ORDER — METHYLPREDNISOLONE ACETATE 40 MG/ML IJ SUSP
40.0000 mg | INTRAMUSCULAR | Status: AC | PRN
  Administered 2016-10-31: 40 mg via INTRA_ARTICULAR

## 2016-10-31 NOTE — Progress Notes (Signed)
Office Visit Note   Patient: Brandi Bryant           Date of Birth: 1955/12/14           MRN: [id number] Visit Date: 10/31/2016              Requested by: Arnoldo Morale, MD [place], [place] [id number] PCP: Arnoldo Morale, MD   Assessment & Plan: Visit Diagnoses:  1. Impingement syndrome of right ankle   2. Diabetic polyneuropathy associated with type 2 diabetes mellitus ([place])     Plan: Plan for repeat injection of the right ankle today. Follow-up in 4 weeks. Discussed that if she is still symptomatic we could consider arthroscopic debridement of the right ankle.  Follow-Up Instructions: Return in about 4 weeks (around 11/28/2016).   Orders:  Orders Placed This Encounter  Procedures  . Medium Joint Injection/Arthrocentesis   No orders of the defined types were placed in this encounter.     Procedures: Medium Joint Inj Date/Time: 10/31/2016 10:51 AM Performed by: DUDA, MARCUS V Authorized by: Newt Minion   Consent Given by:  Patient Site marked: the procedure site was marked   Timeout: prior to procedure the correct patient, procedure, and site was verified   Indications:  Pain and diagnostic evaluation Location:  Ankle Site:  R ankle Prep: patient was prepped and draped in usual sterile fashion   Needle Size:  22 G Needle Length:  1.5 inches Approach:  Anteromedial Ultrasound Guided: No   Fluoroscopic Guidance: No   Medications:  2 mL lidocaine 1 %; 40 mg methylPREDNISolone acetate 40 MG/ML Aspiration Attempted: No   Patient tolerance:  Patient tolerated the procedure well with no immediate complications     Clinical Data: No additional findings.   Subjective: Chief Complaint  Patient presents with  . Right Ankle - Follow-up    S/p injection right ankle 09/29/16    Right ankle pain. The pt is 1 month s/p injection and reports that it only helped for a few days and the pain returned.    Patient states she had about 1 week  of relief from the injection. She complains of pain primarily anterior lateral joint line. Review of Systems   Objective: Vital Signs: There were no vitals taken for this visit.  Physical Exam on examination patient is alert oriented no adenopathy well-dressed normal affect number Priscella Mann effort she does have an antalgic gait she was seen with family as well as an interpreter. She has a good dorsalis pedis pulse there is no redness no cellulitis she has some mild skin color changes from venous insufficiency with mild swelling she is massively tender to palpation over the anterior lateral joint line.  Ortho Exam  Specialty Comments:  No specialty comments available.  Imaging: No results found.   PMFS History: Patient Active Problem List   Diagnosis Date Noted  . Diabetic polyneuropathy associated with type 2 diabetes mellitus ([place]) 10/31/2016  . Impingement syndrome of right ankle 10/31/2016  . Symptomatic cholelithiasis 12/09/2015  . Encounter for screening colonoscopy 10/30/2015  . Colon cancer screening 10/27/2015  . Bronchitis, acute 09/08/2014  . Palpitations 08/28/2014  . Preventive measure 08/21/2014  . Intractable headache 02/21/2014  . TIA (transient ischemic attack) 02/20/2014  . Trigeminal neuralgia 01/22/2014  . Weakness 01/22/2014  . Bell's palsy 01/21/2014  . UTI (urinary tract infection) 01/21/2014  . Hepatic steatosis 01/21/2014  . Headache 01/20/2014  . Diabetes type 2, uncontrolled ([place]) 01/20/2014  . Facial  droop 01/15/2014  . Stroke ([place]) 01/15/2014  . Type 2 diabetes mellitus with diabetic neuropathy ([place]) 12/06/2013  . Hyperlipidemia 11/07/2013  . CAD (coronary artery disease), native coronary artery 11/07/2013  . Chest pain 09/30/2013  . Arthralgia 08/13/2013  . History of breast cancer 06/01/2012  . Abdominal pain 01/31/2012  . Nausea & vomiting 01/31/2012  . HTN (hypertension)   . Diabetes mellitus ([place])    Past Medical History:  Diagnosis  Date  . Abdominal pain   . Arthralgia 08/13/2013  . Bell's palsy   . Breast cancer ([place])   . Chest pain 09/30/2013  . Cholelithiasis   . Colon cancer screening 10/27/2015  . Coronary artery disease   . Diabetes mellitus   . Encounter for screening colonoscopy 10/30/2015  . Hepatic steatosis   . History of breast cancer 06/01/2012  . HTN (hypertension)   . Stroke [facility]) 2006    Family History  Problem Relation Age of Onset  . Thyroid cancer Sister   . Hypertension Mother   . Diabetes Mother   . Migraines Daughter     Past Surgical History:  Procedure Laterality Date  . BREAST LUMPECTOMY  2010  . CHOLECYSTECTOMY N/A 12/09/2015   Procedure: LAPAROSCOPIC CHOLECYSTECTOMY WITH INTRAOPERATIVE CHOLANGIOGRAM;  Surgeon: Greer Pickerel, MD;  Location: [place];  Service: General;  Laterality: N/A;  . LEFT HEART CATHETERIZATION WITH CORONARY ANGIOGRAM N/A 10/30/2013   Procedure: LEFT HEART CATHETERIZATION WITH CORONARY ANGIOGRAM;  Surgeon: Josue Hector, MD;  Location: [facility] CATH LAB;  Service: Cardiovascular;  Laterality: N/A;  . TUBAL LIGATION     Social History   Occupational History  . Not on file.   Social History Main Topics  . Smoking status: Never Smoker  . Smokeless tobacco: Never Used  . Alcohol use No  . Drug use: No  . Sexual activity: Not Currently    Birth control/ protection: Surgical

## 2016-11-01 ENCOUNTER — Ambulatory Visit: Payer: No Typology Code available for payment source | Admitting: Cardiology

## 2016-11-02 ENCOUNTER — Ambulatory Visit (INDEPENDENT_AMBULATORY_CARE_PROVIDER_SITE_OTHER): Payer: No Typology Code available for payment source | Admitting: Cardiology

## 2016-11-02 VITALS — BP 124/72 | HR 72 | Ht 65.0 in | Wt 146.0 lb

## 2016-11-02 DIAGNOSIS — R002 Palpitations: Secondary | ICD-10-CM

## 2016-11-02 DIAGNOSIS — I10 Essential (primary) hypertension: Secondary | ICD-10-CM

## 2016-11-02 DIAGNOSIS — E781 Pure hyperglyceridemia: Secondary | ICD-10-CM

## 2016-11-02 DIAGNOSIS — R079 Chest pain, unspecified: Secondary | ICD-10-CM

## 2016-11-02 DIAGNOSIS — J209 Acute bronchitis, unspecified: Secondary | ICD-10-CM

## 2016-11-02 MED ORDER — ALBUTEROL SULFATE HFA 108 (90 BASE) MCG/ACT IN AERS: 1.0000 | INHALATION_SPRAY | Freq: Four times a day (QID) | RESPIRATORY_TRACT | 1 refills | Status: DC | PRN

## 2016-11-02 MED FILL — GABAPENTIN 100 MG CAPSULE: 100 | 30 days supply | Qty: 90 | Fill #0

## 2016-11-02 MED FILL — VENTOLIN HFA 90 MCG INHALER: 108 (90 BAS | 25 days supply | Qty: 18 | Fill #0

## 2016-11-02 NOTE — Patient Instructions (Signed)
Medication Instructions:   DR Meda Coffee REFILLED YOUR ALBUTEROL INHALER FOR YOU TO USE AS NEEDED FOR WHEEZING, COUGH AND SHORTNESS OF BREATH--FOLLOW INSTRUCTIONS ON INHALER CAREFULLY  DR Meda Coffee RECOMMENDS THAT YOU PURCHASE OVER-THE-COUNTER MUCINEX (WITHOUT D) FOR DRY CONTINUOUS  COUGH     Testing/Procedures:  Your physician has requested that you have en exercise stress myoview. For further information please visit [email or web address]. Please follow instruction sheet, as given.  PLEASE HAVE THIS DONE IN December PER DR NELSON     Follow-Up:  3 MONTHS WITH DR Meda Coffee       If you need a refill on your cardiac medications before your next appointment, please call your pharmacy.

## 2016-11-02 NOTE — Progress Notes (Signed)
Cardiology Office Note    Date:  11/03/2016   ID:  Brandi Bryant, DOB February 25, 1956, MRN [id number]  PCP:  Brandi Morale, MD  Cardiologist:  Brandi Dawley, MD   Chief complain: chest pain  History of Present Illness:  Brandi Bryant is a 60 y.o. 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 She was on adjuvant hormonal therapy with tamoxifen started in August 2010. In March 2014: her treatment is switched to Femara. Her most recent CAT scan showed no evidence of metastatic disease. The same CT chest showed calcifications of her coronary vessels including left main and LAD.  The patient has been experiencing exertional dyspnea after walking a flight of stairs, that eventually progresses to chest pain on almost any exertion. She underwent a cardiac cath in Dec 2014 that showed mod non-obstructive CAD in the LAD/D1, D2, normal LVEF 60-65%. She was started on low dose atorvastatin considering her fatty lipids  11/02/16 - 6 months follow up, the patient has been in her PCP office 2 weeks ago with complains of cough, chills and obtained antibiotics. She has also experience chest pain while taking a deep breath, coughing, but also walking and associated with DOE. Denies dizziness, palpitations or syncope. She has episodes of SOB at rest.   Past Medical History:  Diagnosis Date  . Abdominal pain   . Arthralgia 08/13/2013  . Bell's palsy   . Breast cancer ([place])   . Chest pain 09/30/2013  . Cholelithiasis   . Colon cancer screening 10/27/2015  . Coronary artery disease   . Diabetes mellitus   . Encounter for screening colonoscopy 10/30/2015  . Hepatic steatosis   . History of breast cancer 06/01/2012  . HTN (hypertension)   .  Stroke [facility]) 2006    Past Surgical History:  Procedure Laterality Date  . BREAST LUMPECTOMY  2010  . CHOLECYSTECTOMY N/A 12/09/2015   Procedure: LAPAROSCOPIC CHOLECYSTECTOMY WITH INTRAOPERATIVE CHOLANGIOGRAM;  Surgeon: Brandi Pickerel, MD;  Location: [place];  Service: General;  Laterality: N/A;  . LEFT HEART CATHETERIZATION WITH CORONARY ANGIOGRAM N/A 10/30/2013   Procedure: LEFT HEART CATHETERIZATION WITH CORONARY ANGIOGRAM;  Surgeon: Brandi Hector, MD;  Location: [facility] CATH LAB;  Service: Cardiovascular;  Laterality: N/A;  . TUBAL LIGATION      Current Medications: Outpatient Medications Prior to Visit  Medication Sig Dispense Refill  . aspirin 81 MG tablet Take 81 mg by mouth daily.    Marland Kitchen atorvastatin (LIPITOR) 10 MG tablet Take 1 tablet (10 mg total) by mouth daily. Needs office visit for refills 30 tablet 0  . azithromycin (ZITHROMAX) 250 MG tablet Take 1 tablet (250 mg total) by mouth daily. Take first 2 tablets together, then 1 every day until finished. 6 tablet 0  . benzonatate (TESSALON) 100 MG capsule Take 1 capsule (100 mg total) by mouth every 8 (eight) hours. 21 capsule 0  . calcium carbonate (TUMS - DOSED IN MG ELEMENTAL CALCIUM) 500 MG chewable tablet Chew 1 tablet by mouth daily. Reported on 05/19/2016    . carvedilol (COREG) 12.5 MG tablet Take 1 tablet (12.5 mg total) by mouth 2 (two) times daily with a meal. 60 tablet 3  . gabapentin (NEURONTIN) 100 MG capsule Take 1 capsule (100 mg total) by mouth 3 (three) times daily. 90 capsule 0  .  lactulose (CHRONULAC) 10 GM/15ML solution Take 15 mLs (10 g total) by mouth 3 (three) times daily. 946 mL 3  . losartan (COZAAR) 100 MG tablet Take 1 tablet (100 mg total) by mouth daily. 30 tablet 4  . meclizine (ANTIVERT) 25 MG tablet Take 1 tablet (25 mg total) by mouth 3 (three) times daily as needed for dizziness. 30 tablet 0  . metFORMIN (GLUCOPHAGE) 1000 MG tablet TAKE 1 TABLET BY MOUTH 2 TIMES DAILY WITH A MEAL. 60 tablet 0  .  methylPREDNISolone (MEDROL DOSEPAK) 4 MG TBPK tablet Take 6-5-4-3-2-1 po qd 21 tablet 0  . pantoprazole (PROTONIX) 40 MG tablet Take 1 tablet (40 mg total) by mouth 2 (two) times daily. 60 tablet 0  . PRESCRIPTION MEDICATION Take 0.5 tablets by mouth daily. Reported on 05/19/2016    . sulfamethoxazole-trimethoprim (BACTRIM DS,SEPTRA DS) 800-160 MG tablet Take 1 tablet by mouth 2 (two) times daily. 14 tablet 0  . traZODone (DESYREL) 50 MG tablet Take 1 tablet (50 mg total) by mouth at bedtime as needed for sleep. 30 tablet 0  . albuterol (PROVENTIL HFA;VENTOLIN HFA) 108 (90 BASE) MCG/ACT inhaler Inhale 1-2 puffs into the lungs every 6 (six) hours as needed for wheezing. 1 Inhaler 0   Facility-Administered Medications Prior to Visit  Medication Dose Route Frequency Provider Last Rate Last Dose  . aspirin tablet 325 mg  325 mg Oral Daily Brandi Bosch, NP   325 mg at 09/02/15 1628     Allergies:   Gadolinium derivatives   Social History   Social History  . Marital status: Single    Spouse name: N/A  . Number of children: N/A  . Years of education: N/A   Social History Main Topics  . Smoking status: Never Smoker  . Smokeless tobacco: Never Used  . Alcohol use No  . Drug use: No  . Sexual activity: Not Currently    Birth control/ protection: Surgical   Other Topics Concern  . Not on file   Social History Narrative  . No narrative on file     Family History:  The patient's family history includes Diabetes in her mother; Hypertension in her mother; Migraines in her daughter; Thyroid cancer in her sister.   ROS:   Please see the history of present illness.    ROS All other systems reviewed and are negative.   PHYSICAL EXAM:   VS:  BP 124/72   Pulse 72   Ht 5\' 5"  (1.651 m)   Wt 146 lb (66.2 kg)   BMI 24.30 kg/m    GEN: Well nourished, well developed, in no acute distress  HEENT: normal  Neck: no JVD, carotid bruits, or masses Cardiac: RRR; no murmurs, rubs, or gallops,no  edema  Respiratory:  clear to auscultation bilaterally, normal work of breathing GI: soft, nontender, nondistended, + BS MS: no deformity or atrophy  Skin: warm and dry, no rash Neuro:  Alert and Oriented x 3, Strength and sensation are intact Psych: euthymic mood, full affect  Wt Readings from Last 3 Encounters:  11/02/16 146 lb (66.2 kg)  10/27/16 148 lb 6.4 oz (67.3 kg)  09/29/16 147 lb (66.7 kg)      Studies/Labs Reviewed:   EKG:  EKG is ordered today.  The ekg ordered today demonstrates SR, normal ECG  Recent Labs: 12/11/2015: ALT 30; BUN 13; Creatinine, Ser 0.83; Hemoglobin 12.7; Platelets 121; Potassium 3.6; Sodium 142   Lipid Panel    Component Value Date/Time   CHOL  155 04/18/2014 0855   TRIG 129 04/18/2014 0855   HDL 47 04/18/2014 0855   CHOLHDL 3.3 04/18/2014 0855   VLDL 26 04/18/2014 0855   LDLCALC 82 04/18/2014 0855      ASSESSMENT:    1. Palpitations   2. Chest pain, unspecified type   3. Essential hypertension   4. Pure hyperglyceridemia   5. Acute bronchitis, unspecified organism      PLAN:  In order of problems listed above:  1. Chest pain - some typical but mostly atypical features, given h/o  a cardiac cath that showed moderate non-obstructive CAD, we will schedule an exercise nuclear stress test.  2. SOB post URI, I will prescribe Albuterol that helped in the past.  3. Palpitations - improved after starting carvedilol, 48 hour Holter monitor showed only few PVCs.  4. Hypertension - controlled   5. Hyperlipidemia - continue atorvastatin 10 mg po daily  5. diabetic neuropathy - improved with Lyrica 50 mg twice a day  Follow up in 3 months.  Medication Adjustments/Labs and Tests Ordered: Current medicines are reviewed at length with the patient today.  Concerns regarding medicines are outlined above.  Medication changes, Labs and Tests ordered today are listed in the Patient Instructions below. Patient Instructions  Medication  Instructions:   DR Meda Coffee REFILLED YOUR ALBUTEROL INHALER FOR YOU TO USE AS NEEDED FOR WHEEZING, COUGH AND SHORTNESS OF BREATH--FOLLOW INSTRUCTIONS ON INHALER CAREFULLY  DR Meda Coffee RECOMMENDS THAT YOU PURCHASE OVER-THE-COUNTER MUCINEX (WITHOUT D) FOR DRY CONTINUOUS  COUGH     Testing/Procedures:  Your physician has requested that you have en exercise stress myoview. For further information please visit [email or web address]. Please follow instruction sheet, as given.  PLEASE HAVE THIS DONE IN December PER DR Keatyn Luck     Follow-Up:  3 MONTHS WITH DR Meda Coffee       If you need a refill on your cardiac medications before your next appointment, please call your pharmacy.      Signed, Brandi Dawley, MD  11/03/2016 11:46 PM    [address], [place], [place]  [address] Phone: [phone]; Fax: [phone]

## 2016-11-04 ENCOUNTER — Encounter: Payer: Self-pay | Admitting: Physician Assistant

## 2016-11-04 ENCOUNTER — Ambulatory Visit: Payer: Self-pay | Attending: Family Medicine | Admitting: Physician Assistant

## 2016-11-04 VITALS — BP 132/83 | HR 82 | Temp 98.5°F | Resp 16 | Wt 147.2 lb

## 2016-11-04 DIAGNOSIS — I10 Essential (primary) hypertension: Secondary | ICD-10-CM | POA: Insufficient documentation

## 2016-11-04 DIAGNOSIS — I25118 Atherosclerotic heart disease of native coronary artery with other forms of angina pectoris: Secondary | ICD-10-CM

## 2016-11-04 DIAGNOSIS — E119 Type 2 diabetes mellitus without complications: Secondary | ICD-10-CM

## 2016-11-04 DIAGNOSIS — Z79899 Other long term (current) drug therapy: Secondary | ICD-10-CM | POA: Insufficient documentation

## 2016-11-04 DIAGNOSIS — I2510 Atherosclerotic heart disease of native coronary artery without angina pectoris: Secondary | ICD-10-CM | POA: Insufficient documentation

## 2016-11-04 DIAGNOSIS — Z794 Long term (current) use of insulin: Secondary | ICD-10-CM | POA: Insufficient documentation

## 2016-11-04 DIAGNOSIS — Z853 Personal history of malignant neoplasm of breast: Secondary | ICD-10-CM | POA: Insufficient documentation

## 2016-11-04 DIAGNOSIS — E1140 Type 2 diabetes mellitus with diabetic neuropathy, unspecified: Secondary | ICD-10-CM | POA: Insufficient documentation

## 2016-11-04 DIAGNOSIS — Z7982 Long term (current) use of aspirin: Secondary | ICD-10-CM | POA: Insufficient documentation

## 2016-11-04 LAB — GLUCOSE, POCT (MANUAL RESULT ENTRY): POC Glucose: 198 mg/dl — AB (ref 70–99)

## 2016-11-04 LAB — POCT GLYCOSYLATED HEMOGLOBIN (HGB A1C): Hemoglobin A1C: 7.5

## 2016-11-04 MED ORDER — INSULIN PEN NEEDLE 31G X 8 MM MISC: 1 refills | Status: DC

## 2016-11-04 MED ORDER — INSULIN GLARGINE 100 UNIT/ML SOLOSTAR PEN: 10.0000 [IU] | PEN_INJECTOR | Freq: Every day | SUBCUTANEOUS | 99 refills | Status: DC

## 2016-11-04 MED ORDER — FLUCONAZOLE 150 MG PO TABS: 150.0000 mg | ORAL_TABLET | Freq: Once | ORAL | 0 refills | Status: AC

## 2016-11-04 MED FILL — !LANTUS SOLOSTAR 100UNITS/M: 100 | 30 days supply | Qty: 15 | Fill #0

## 2016-11-04 MED FILL — ULTICARE PEN NDL 4MM 32G: 32G X 4 MM | 25 days supply | Qty: 100 | Fill #0

## 2016-11-04 MED FILL — FLUCONAZOLE 150 MG TABLET: 150 | 1 days supply | Qty: 1 | Fill #0

## 2016-11-04 NOTE — Progress Notes (Signed)
Pt is in the office today for diabetes Pt states her blood sugars have been running in the 450's

## 2016-11-04 NOTE — Progress Notes (Signed)
Patient ID: Brandi Bryant, female   DOB: Jul 11, 1956, 60 y.o.   MRN: [id number]        Brandi Bryant, is a 60 y.o. female  [id number]  [id number]  DOB - 1956-01-14  Subjective:  Chief Complaint and HPI: Brandi Bryant is a 60 y.o. female here today For diabetic management.  She says she is compliant with her medications.  She denies s/sx hyper/hypoglycemia.  Her daughter is translating.  She checks her sugar 4-6 times daily.  Judeth Cornfield gets ~200-450.  She says she is familiar with injection herself with insulin because she has used her sisters Novolog in the past.    She had recent follow up with oncology for h/o breast CA with no current evidence of recurrence.  She has also had multiple Urgent care visits over the last few months and saw cardiology on 11/02/2016.  She is followed by Dr Sharol Given for impingement syndrome of the ankle.  She is currently on Septra for E. Coli UTI discovered by oncology.    Recent Urgent care/cardiology/ortho/oncology notes reviewed.     ROS:   Constitutional:  No f/c, No night sweats, No unexplained weight loss. EENT:  No vision changes, No blurry vision, No hearing changes. No mouth, throat, or ear problems.  Respiratory: No cough, No SOB Cardiac: No CP, no palpitations GI:  No abd pain, No N/V/D. GU: No Urinary s/sx Musculoskeletal: No joint pain Neuro: No headache, +occasional dizziness for > 27months, no motor weakness.  Skin: No rash Endocrine:  No polydipsia. No polyuria.  Psych: Denies SI/HI  No problems updated.  ALLERGIES: Allergies  Allergen Reactions  . Gadolinium Derivatives     Code: VOM, Desc: Pt began vomiting 45 sec after MRI contrast injection of Multihance, Onset Date: [id number]      PAST MEDICAL HISTORY: Past Medical History:  Diagnosis Date  . Abdominal pain   . Arthralgia 08/13/2013  . Bell's palsy   . Breast cancer ([place])   . Chest pain 09/30/2013  . Cholelithiasis   . Colon cancer screening  10/27/2015  . Coronary artery disease   . Diabetes mellitus   . Encounter for screening colonoscopy 10/30/2015  . Hepatic steatosis   . History of breast cancer 06/01/2012  . HTN (hypertension)   . Stroke [facility]) 2006    MEDICATIONS AT HOME: Prior to Admission medications   Medication Sig Start Date End Date Taking? Authorizing Provider  albuterol (PROVENTIL HFA;VENTOLIN HFA) 108 (90 Base) MCG/ACT inhaler Inhale 1-2 puffs into the lungs every 6 (six) hours as needed for wheezing or shortness of breath. 11/02/16   Dorothy Spark, MD  aspirin 81 MG tablet Take 81 mg by mouth daily.    Historical Provider, MD  atorvastatin (LIPITOR) 10 MG tablet Take 1 tablet (10 mg total) by mouth daily. Needs office visit for refills 10/21/16   Lysbeth Penner, FNP  benzonatate (TESSALON) 100 MG capsule Take 1 capsule (100 mg total) by mouth every 8 (eight) hours. 10/21/16   Lysbeth Penner, FNP  calcium carbonate (TUMS - DOSED IN MG ELEMENTAL CALCIUM) 500 MG chewable tablet Chew 1 tablet by mouth daily. Reported on 05/19/2016    Historical Provider, MD  carvedilol (COREG) 12.5 MG tablet Take 1 tablet (12.5 mg total) by mouth 2 (two) times daily with a meal. 05/19/16   Arnoldo Morale, MD  fluconazole (DIFLUCAN) 150 MG tablet Take 1 tablet (150 mg total) by mouth once. If needed for yeast infection/vaginal itching 11/04/16 11/04/16  Argentina Donovan,  PA-C  gabapentin (NEURONTIN) 100 MG capsule Take 1 capsule (100 mg total) by mouth 3 (three) times daily. 10/21/16   Lysbeth Penner, FNP  Insulin Glargine (LANTUS SOLOSTAR) 100 UNIT/ML Solostar Pen Inject 10 Units into the skin daily at 10 pm. 11/04/16   Argentina Donovan, PA-C  lactulose (CHRONULAC) 10 GM/15ML solution Take 15 mLs (10 g total) by mouth 3 (three) times daily. 05/19/16   Arnoldo Morale, MD  losartan (COZAAR) 100 MG tablet Take 1 tablet (100 mg total) by mouth daily. 10/21/16   Lysbeth Penner, FNP  meclizine (ANTIVERT) 25 MG tablet Take 1 tablet (25 mg  total) by mouth 3 (three) times daily as needed for dizziness. 10/21/16   Lysbeth Penner, FNP  metFORMIN (GLUCOPHAGE) 1000 MG tablet TAKE 1 TABLET BY MOUTH 2 TIMES DAILY WITH A MEAL. 10/21/16   Lysbeth Penner, FNP  methylPREDNISolone (MEDROL DOSEPAK) 4 MG TBPK tablet Take 6-5-4-3-2-1 po qd 10/21/16   Lysbeth Penner, FNP  pantoprazole (PROTONIX) 40 MG tablet Take 1 tablet (40 mg total) by mouth 2 (two) times daily. 05/04/16   Brayton Caves, PA-C  PRESCRIPTION MEDICATION Take 0.5 tablets by mouth daily. Reported on 05/19/2016    Historical Provider, MD  sulfamethoxazole-trimethoprim (BACTRIM DS,SEPTRA DS) 800-160 MG tablet Take 1 tablet by mouth 2 (two) times daily. 10/27/16   Holley Bouche, NP  traZODone (DESYREL) 50 MG tablet Take 1 tablet (50 mg total) by mouth at bedtime as needed for sleep. 10/21/16   Lysbeth Penner, FNP     Objective:  EXAM:   Vitals:   11/04/16 0924  BP: 132/83  Pulse: 82  Resp: 16  Temp: 98.5 F (36.9 C)  TempSrc: Oral  SpO2: 95%  Weight: 147 lb 3.2 oz (66.8 kg)    General appearance : A&OX3. NAD. Non-toxic-appearing HEENT: Atraumatic and Normocephalic.  PERRLA. EOM intact.  TM clear B. Mouth-MMM, post pharynx WNL w/o erythema, No PND. Neck: supple, no JVD. No cervical lymphadenopathy. No thyromegaly Chest/Lungs:  Breathing-non-labored, Good air entry bilaterally, breath sounds normal without rales, rhonchi, or wheezing  CVS: S1 S2 regular, no murmurs, gallops, rubs  Extremities: Bilateral Lower Ext shows no edema, both legs are warm to touch with = pulse throughout Neurology:  CN II-XII grossly intact, Non focal.   Psych:  TP linear. J/I WNL. Normal speech. Appropriate eye contact and affect.  Skin:  No Rash  Data Review Lab Results  Component Value Date   HGBA1C 7.5 11/04/2016   HGBA1C 7.2 05/04/2016   HGBA1C 6.30 12/21/2015     Assessment & Plan   1. Type 2 diabetes mellitus with diabetic neuropathy, without long-term current use of  insulin (HCC) DO NOT USE OTHER PEOPLE"S INSULIN.  This could be life-threatening /potentially fatal.  Patient expresses understanding.   - POCT glucose (manual entry) - POCT glycosylated hemoglobin (Hb A1C) add- Insulin Glargine (LANTUS SOLOSTAR) 100 UNIT/ML Solostar Pen; Inject 10 Units into the skin daily at 10 pm.  Dispense: 5 pen; Refill: PRN Check blood sugars fasting, at lunch, and at bedtime.  Write down these numbers and bring them to your next visit.  2. Essential hypertension Adequate control.  Continue current regimen  3. Coronary artery disease involving native coronary artery of native heart with other form of angina pectoris ([place]) Continue f/up with Cardiology  Rx sent for Diflucan due to 2 recent antibiotics   Patient have been counseled extensively about nutrition and exercise  Return in about 2  weeks (around 11/18/2016) for with Dr Jarold Song for labs and DM f/up.  The patient was given clear instructions to go to ER or return to medical center if symptoms don't improve, worsen or new problems develop. The patient verbalized understanding. The patient was told to call to get lab results if they haven't heard anything in the next week.     Freeman Caldron, PA-C [facility] and [facility] [place], [place]   11/04/2016, 9:43 AM

## 2016-11-04 NOTE — Patient Instructions (Signed)
Start Lantus 10 units at bedtime  Check blood sugars fasting, at lunch, and at bedtime.  Write down these numbers and bring them to your next visit.

## 2016-11-07 ENCOUNTER — Other Ambulatory Visit (HOSPITAL_COMMUNITY): Payer: Self-pay | Admitting: *Deleted

## 2016-11-07 DIAGNOSIS — Z853 Personal history of malignant neoplasm of breast: Secondary | ICD-10-CM

## 2016-11-08 ENCOUNTER — Telehealth (HOSPITAL_COMMUNITY): Payer: Self-pay | Admitting: *Deleted

## 2016-11-08 NOTE — Telephone Encounter (Signed)
Interpreter phone line used to contact patient. Brandi Bryant, Interpreter asked patient if ok to speak with daughter. Patient verbalized permission.Patient's daughter given detailed instructions per Myocardial Perfusion Study Information Sheet for the test on 11/11/16 at [place]. Patient notified to arrive 15 minutes early and that it is imperative to arrive on time for appointment to keep from having the test rescheduled.  If you need to cancel or reschedule your appointment, please call the office within 24 hours of your appointment. Failure to do so may result in a cancellation of your appointment, and a $50 no show fee. Patient verbalized understanding.[name], [place]anae [place]

## 2016-11-11 ENCOUNTER — Ambulatory Visit (HOSPITAL_COMMUNITY): Payer: No Typology Code available for payment source | Attending: Cardiology

## 2016-11-11 DIAGNOSIS — R079 Chest pain, unspecified: Secondary | ICD-10-CM | POA: Insufficient documentation

## 2016-11-11 DIAGNOSIS — R002 Palpitations: Secondary | ICD-10-CM | POA: Insufficient documentation

## 2016-11-11 LAB — MYOCARDIAL PERFUSION IMAGING
Estimated workload: 7 METS
Exercise duration (min): 5 min
Exercise duration (sec): 0 s
LV dias vol: 65 mL (ref 46–106)
LV sys vol: 21 mL
MPHR: 161 {beats}/min
Peak HR: 146 {beats}/min
Percent HR: 90 %
RATE: 0.22
Rest HR: 81 {beats}/min
SDS: 6
SRS: 4
SSS: 10
TID: 1.02

## 2016-11-11 MED ORDER — TECHNETIUM TC 99M TETROFOSMIN IV KIT
10.2000 | PACK | Freq: Once | INTRAVENOUS | Status: AC | PRN
  Administered 2016-11-11: 10.2 via INTRAVENOUS
  Filled 2016-11-11: qty 11

## 2016-11-11 MED ORDER — TECHNETIUM TC 99M TETROFOSMIN IV KIT
32.1000 | PACK | Freq: Once | INTRAVENOUS | Status: AC | PRN
  Administered 2016-11-11: 32.1 via INTRAVENOUS
  Filled 2016-11-11: qty 33

## 2016-11-18 ENCOUNTER — Encounter: Payer: Self-pay | Admitting: *Deleted

## 2016-11-24 ENCOUNTER — Other Ambulatory Visit: Payer: Self-pay | Admitting: Family Medicine

## 2016-11-24 DIAGNOSIS — I10 Essential (primary) hypertension: Secondary | ICD-10-CM

## 2016-11-24 DIAGNOSIS — I25119 Atherosclerotic heart disease of native coronary artery with unspecified angina pectoris: Secondary | ICD-10-CM

## 2016-11-24 MED FILL — ?CARVEDILOL 12.5 MG TABLET: 12.5 | 30 days supply | Qty: 60 | Fill #0

## 2016-11-24 MED FILL — metFORMIN HCL 1000 MG TABS: 1000 | 30 days supply | Qty: 60 | Fill #0

## 2016-11-25 ENCOUNTER — Telehealth: Payer: Self-pay | Admitting: Family Medicine

## 2016-11-25 MED ORDER — PANTOPRAZOLE SODIUM 40 MG PO TBEC: 40.0000 mg | DELAYED_RELEASE_TABLET | Freq: Two times a day (BID) | ORAL | 0 refills | Status: DC

## 2016-11-25 MED FILL — LOSARTAN POTASSIUM 100 MG T: 100 | 30 days supply | Qty: 30 | Fill #4

## 2016-11-25 MED FILL — ?PANTOPRAZOLE SOD DR 40MG: 40 MG | 30 days supply | Qty: 60 | Fill #0

## 2016-11-25 NOTE — Telephone Encounter (Signed)
Refilled pantoprazole for her stomach - patient needs office visit with Dr. Jarold Song to re-evaluate the dose of the pantoprazole and need for use since it was prescribed by Zettie Pho PA-C

## 2016-11-25 NOTE — Telephone Encounter (Signed)
Patient is needing refill for medication for her stomach. Patient does not remember the name of the medication. Please follow up.

## 2016-11-28 ENCOUNTER — Ambulatory Visit (INDEPENDENT_AMBULATORY_CARE_PROVIDER_SITE_OTHER): Payer: Self-pay | Admitting: Orthopedic Surgery

## 2016-11-28 VITALS — Ht 65.0 in | Wt 147.0 lb

## 2016-11-28 DIAGNOSIS — M25871 Other specified joint disorders, right ankle and foot: Secondary | ICD-10-CM

## 2016-11-28 DIAGNOSIS — M7751 Other enthesopathy of right foot: Secondary | ICD-10-CM

## 2016-11-28 DIAGNOSIS — E1142 Type 2 diabetes mellitus with diabetic polyneuropathy: Secondary | ICD-10-CM

## 2016-11-28 NOTE — Progress Notes (Signed)
Office Visit Note   Patient: Brandi Bryant           Date of Birth: [birth year]           MRN: [id number] Visit Date: 11/28/2016              Requested by: Arnoldo Morale, MD [place], [name] [id number] PCP: Arnoldo Morale, MD  Chief Complaint  Patient presents with  . Right Ankle - Follow-up    S/p injection right ankle 10/31/16    HPI: Patient is s/p an injection for a right ankle impingement and she states that it has not made her pain better but has made it worse. She states that her ankle swells and is painful with walking on the lateral side. Autumn L Forrest, RMA   Patient has tried conservative therapy with anti-inflammatories and mobilization injections without relief. Assessment & Plan: Visit Diagnoses:  1. Diabetic polyneuropathy associated with type 2 diabetes mellitus ([place])   2. Impingement syndrome of right ankle     Plan: there is a conservative treatment pain with activity is a daily living patient states elected to proceed with surgical intervention. We'll plan for right ankle arthroscopy. Risk and benefits were discussed including persistent pain and need for additional surgery. Discussed that her chance of improvement is about 75%. Patient states elected to proceed with surgery at this time.  Follow-Up Instructions: Return in about 3 weeks (around 12/19/2016).   Ortho Exam Examination patient is alert oriented no adenopathy well-dressed normal affect normal respiratory effort she does have an antalgic gait. Patient is seen in evaluation with her interpreter. Patient has a good dorsalis pedis pulse ankle anterior pain is reproduced with plantarflexion and dorsiflexion of the ankle. She is tender to palpation medial lateral joint lines of the right ankle. The peroneal and posterior tibial tendons are nontender to palpation. Patient is no redness no cellulitis. There is no skin wounds or ulcers.  Imaging: No results found.  Orders:  No  orders of the defined types were placed in this encounter.  No orders of the defined types were placed in this encounter.    Procedures: No procedures performed  Clinical Data: No additional findings.  Subjective: Review of Systems  Objective: Vital Signs: Ht 5\' 5"  (1.651 m)   Wt 147 lb (66.7 kg)   BMI 24.46 kg/m   Specialty Comments:  No specialty comments available.  PMFS History: Patient Active Problem List   Diagnosis Date Noted  . Diabetic polyneuropathy associated with type 2 diabetes mellitus ([place]) 10/31/2016  . Impingement syndrome of right ankle 10/31/2016  . Symptomatic cholelithiasis 12/09/2015  . Encounter for screening colonoscopy 10/30/2015  . Colon cancer screening 10/27/2015  . Bronchitis, acute 09/08/2014  . Palpitations 08/28/2014  . Preventive measure 08/21/2014  . Intractable headache 02/21/2014  . TIA (transient ischemic attack) 02/20/2014  . Trigeminal neuralgia 01/22/2014  . Weakness 01/22/2014  . Bell's palsy 01/21/2014  . UTI (urinary tract infection) 01/21/2014  . Hepatic steatosis 01/21/2014  . Headache 01/20/2014  . Diabetes type 2, uncontrolled ([place]) 01/20/2014  . Facial droop 01/15/2014  . Stroke ([place]) 01/15/2014  . Type 2 diabetes mellitus with diabetic neuropathy ([place]) 12/06/2013  . Hyperlipidemia 11/07/2013  . CAD (coronary artery disease), native coronary artery 11/07/2013  . Chest pain 09/30/2013  . Arthralgia 08/13/2013  . History of breast cancer 06/01/2012  . Abdominal pain 01/31/2012  . Nausea & vomiting 01/31/2012  . HTN (hypertension)   . Diabetes mellitus ([place])  Past Medical History:  Diagnosis Date  . Abdominal pain   . Arthralgia 08/13/2013  . Bell's palsy   . Breast cancer ([place])   . Chest pain 09/30/2013  . Cholelithiasis   . Colon cancer screening 10/27/2015  . Coronary artery disease   . Diabetes mellitus   . Encounter for screening colonoscopy 10/30/2015  . Hepatic steatosis   . History of breast  cancer 06/01/2012  . HTN (hypertension)   . Stroke [facility]) 2006    Family History  Problem Relation Age of Onset  . Thyroid cancer Sister   . Hypertension Mother   . Diabetes Mother   . Migraines Daughter     Past Surgical History:  Procedure Laterality Date  . BREAST LUMPECTOMY  2010  . CHOLECYSTECTOMY N/A 12/09/2015   Procedure: LAPAROSCOPIC CHOLECYSTECTOMY WITH INTRAOPERATIVE CHOLANGIOGRAM;  Surgeon: Greer Pickerel, MD;  Location: [place];  Service: General;  Laterality: N/A;  . LEFT HEART CATHETERIZATION WITH CORONARY ANGIOGRAM N/A 10/30/2013   Procedure: LEFT HEART CATHETERIZATION WITH CORONARY ANGIOGRAM;  Surgeon: Josue Hector, MD;  Location: [facility] C[facility] LAB;  Service: Cardiovascular;  Laterality: N/A;  . TUBAL LIGATION     Social History   Occupational History  . Not on file.   Social History Main Topics  . Smoking status: Never Smoker  . Smokeless tobacco: Never Used  . Alcohol use No  . Drug use: No  . Sexual activity: Not Currently    Birth control/ protection: Surgical

## 2016-11-30 ENCOUNTER — Other Ambulatory Visit (INDEPENDENT_AMBULATORY_CARE_PROVIDER_SITE_OTHER): Payer: Self-pay | Admitting: Family

## 2016-12-01 ENCOUNTER — Ambulatory Visit (HOSPITAL_COMMUNITY)
Admission: RE | Admit: 2016-12-01 | Discharge: 2016-12-01 | Disposition: A | Discharge status: Home / Self Care | Payer: Self-pay | Source: Ambulatory Visit | Attending: Obstetrics and Gynecology | Admitting: Obstetrics and Gynecology

## 2016-12-01 ENCOUNTER — Encounter (HOSPITAL_COMMUNITY): Payer: Self-pay

## 2016-12-01 ENCOUNTER — Ambulatory Visit
Admission: RE | Admit: 2016-12-01 | Discharge: 2016-12-01 | Disposition: A | Discharge status: Home / Self Care | Payer: No Typology Code available for payment source | Source: Ambulatory Visit | Attending: Adult Health | Admitting: Adult Health

## 2016-12-01 VITALS — BP 152/80 | Temp 98.2°F | Ht 60.0 in | Wt 151.2 lb

## 2016-12-01 DIAGNOSIS — Z170 Estrogen receptor positive status [ER+]: Principal | ICD-10-CM

## 2016-12-01 DIAGNOSIS — C50312 Malignant neoplasm of lower-inner quadrant of left female breast: Secondary | ICD-10-CM

## 2016-12-01 DIAGNOSIS — N644 Mastodynia: Secondary | ICD-10-CM

## 2016-12-01 DIAGNOSIS — Z1239 Encounter for other screening for malignant neoplasm of breast: Secondary | ICD-10-CM

## 2016-12-01 NOTE — Progress Notes (Addendum)
Complaints of a burning sensation around her nipples bilateral breasts x one month. Patient would not rate on the pain scale. She stated is was not pain and that it is a burning sensation.  Pap Smear:  Pap smear not completed today. Last Pap smear was 03/06/2014 at [facility] and normal with negative HPV. Per patient has no history of an abnormal Pap smear. Last Pap smear result is in EPIC.  Physical exam: Breasts Right breast larger than left breast due to patients history of a left breast lumpectomy 04/21/2009. No skin abnormalities bilateral breasts. No nipple retraction bilateral breasts. No nipple discharge bilateral breasts. No lymphadenopathy. No lumps palpated bilateral breasts. No complaints of pain or tenderness on exam. Referred patient to the [place] for diagnostic mammogram and possible bilateral breast ultrasounds. Appointment scheduled for Thursday, December 01, 2016 at 1400.        Pelvic/Bimanual No Pap smear completed today since last Pap smear and HPV typing was completed 03/06/2014. Pap smear not indicated per BCCCP guidelines.   Smoking History: Patient has never smoked.  Patient Navigation: Patient education provided. Access to services provided for patient through [facility] program. Spanish interpreter provided.  Colorectal Cancer Screening: Per patient had never had a colonoscopy completed. No complaints today.  Used Spanish interpreter [employer] from [place].

## 2016-12-01 NOTE — Addendum Note (Signed)
Encounter addended by: Loletta Parish, RN on: 12/01/2016  2:34 PM<BR>    Actions taken: Sign clinical note

## 2016-12-01 NOTE — Patient Instructions (Signed)
Explained breast self awareness to [facility]. Patient did not need a Pap smear today due to last Pap smear and HPV typing was 03/06/2014. Let her know BCCCP will cover Pap smears and HPV typing every 5 years unless has a history of abnormal Pap smears. Referred patient to the [place] for diagnostic mammogram and possible bilateral breast ultrasounds. Appointment scheduled for Thursday, December 01, 2016 at 1400. Brandi Bryant verbalized understanding.  Nancy Manuele, Arvil Chaco, RN 2:08 PM

## 2016-12-02 ENCOUNTER — Encounter (HOSPITAL_COMMUNITY): Payer: Self-pay | Admitting: *Deleted

## 2016-12-06 ENCOUNTER — Ambulatory Visit: Payer: Self-pay | Attending: Family Medicine | Admitting: Family Medicine

## 2016-12-06 ENCOUNTER — Encounter: Payer: Self-pay | Admitting: Family Medicine

## 2016-12-06 VITALS — BP 132/72 | HR 68 | Temp 98.4°F | Ht 60.0 in | Wt 151.8 lb

## 2016-12-06 DIAGNOSIS — E0800 Diabetes mellitus due to underlying condition with hyperosmolarity without nonketotic hyperglycemic-hyperosmolar coma (NKHHC): Secondary | ICD-10-CM

## 2016-12-06 DIAGNOSIS — Z8673 Personal history of transient ischemic attack (TIA), and cerebral infarction without residual deficits: Secondary | ICD-10-CM | POA: Insufficient documentation

## 2016-12-06 DIAGNOSIS — E1140 Type 2 diabetes mellitus with diabetic neuropathy, unspecified: Secondary | ICD-10-CM

## 2016-12-06 DIAGNOSIS — R059 Cough, unspecified: Secondary | ICD-10-CM

## 2016-12-06 DIAGNOSIS — Z794 Long term (current) use of insulin: Secondary | ICD-10-CM | POA: Insufficient documentation

## 2016-12-06 DIAGNOSIS — Z7982 Long term (current) use of aspirin: Secondary | ICD-10-CM | POA: Insufficient documentation

## 2016-12-06 DIAGNOSIS — I10 Essential (primary) hypertension: Secondary | ICD-10-CM | POA: Insufficient documentation

## 2016-12-06 DIAGNOSIS — I2510 Atherosclerotic heart disease of native coronary artery without angina pectoris: Secondary | ICD-10-CM | POA: Insufficient documentation

## 2016-12-06 DIAGNOSIS — Z853 Personal history of malignant neoplasm of breast: Secondary | ICD-10-CM | POA: Insufficient documentation

## 2016-12-06 DIAGNOSIS — Z79899 Other long term (current) drug therapy: Secondary | ICD-10-CM | POA: Insufficient documentation

## 2016-12-06 DIAGNOSIS — R05 Cough: Secondary | ICD-10-CM

## 2016-12-06 LAB — GLUCOSE, POCT (MANUAL RESULT ENTRY): POC Glucose: 263 mg/dl — AB (ref 70–99)

## 2016-12-06 LAB — POCT GLYCOSYLATED HEMOGLOBIN (HGB A1C): Hemoglobin A1C: 8.2

## 2016-12-06 MED ORDER — GLIPIZIDE 10 MG PO TABS: 10.0000 mg | ORAL_TABLET | Freq: Two times a day (BID) | ORAL | 3 refills | Status: DC

## 2016-12-06 MED ORDER — BENZONATATE 100 MG PO CAPS: 100.0000 mg | ORAL_CAPSULE | Freq: Three times a day (TID) | ORAL | 0 refills | Status: DC

## 2016-12-06 MED ORDER — ATORVASTATIN CALCIUM 40 MG PO TABS: 40.0000 mg | ORAL_TABLET | Freq: Every day | ORAL | 3 refills | Status: DC

## 2016-12-06 MED ORDER — CETIRIZINE HCL 10 MG PO TABS: 10.0000 mg | ORAL_TABLET | Freq: Every day | ORAL | 1 refills | Status: DC

## 2016-12-06 MED ORDER — PANTOPRAZOLE SODIUM 40 MG PO TBEC: 40.0000 mg | DELAYED_RELEASE_TABLET | Freq: Every day | ORAL | 3 refills | Status: DC

## 2016-12-06 MED ORDER — INSULIN GLARGINE 100 UNIT/ML SOLOSTAR PEN: 15.0000 [IU] | PEN_INJECTOR | Freq: Every day | SUBCUTANEOUS | 3 refills | Status: DC

## 2016-12-06 MED FILL — ?CETIRIZINE HCL 10 MG TABLE: 10 | 30 days supply | Qty: 30 | Fill #0

## 2016-12-06 MED FILL — ?ATORVASTATIN 40MG TABLET: 40 | 30 days supply | Qty: 30 | Fill #0

## 2016-12-06 MED FILL — BENZONATATE 100 MG CAPSULE: 100 | 7 days supply | Qty: 21 | Fill #0

## 2016-12-06 MED FILL — ?GLIPIZIDE 10 MG TABLET: 10 | 30 days supply | Qty: 60 | Fill #0

## 2016-12-06 MED FILL — ?PANTOPRAZOLE SOD DR 40MG: 40 MG | 30 days supply | Qty: 30 | Fill #0

## 2016-12-06 NOTE — Progress Notes (Signed)
Used lantus for 3 weeks then stopped using it

## 2016-12-06 NOTE — Progress Notes (Signed)
Subjective:  Patient ID: Brandi Bryant, female    DOB: May 21, 1956  Age: 61 y.o. MRN: [id number]  CC: Diabetes and Cough   HPI Brandi Bryant is a 61 year old female with a history of type 2 diabetes mellitus (A1c 8.2), coronary artery disease, hypertension, previous history of left breast cancer (status post lumpectomy and chemotherapy) who presents for follow-up on her diabetes.  She complains of diarrhea with metformin and would like to switch to something else.  Also complains of a dry cough for the last few weeks but denies shortness of breath or chest pain. Pain is relieved by using albuterol inhaler.  Last seen by cardiology last month nuclear stress test was normal.  Past Medical History:  Diagnosis Date  . Abdominal pain   . Arthralgia 08/13/2013  . Bell's palsy   . Breast cancer ([place])   . Chest pain 09/30/2013  . Cholelithiasis   . Colon cancer screening 10/27/2015  . Coronary artery disease   . Diabetes mellitus   . Encounter for screening colonoscopy 10/30/2015  . Hepatic steatosis   . History of breast cancer 06/01/2012  . HTN (hypertension)   . Stroke [facility]) 2006    Past Surgical History:  Procedure Laterality Date  . BREAST LUMPECTOMY  2010  . CHOLECYSTECTOMY N/A 12/09/2015   Procedure: LAPAROSCOPIC CHOLECYSTECTOMY WITH INTRAOPERATIVE CHOLANGIOGRAM;  Surgeon: Greer Pickerel, MD;  Location: [place];  Service: General;  Laterality: N/A;  . LEFT HEART CATHETERIZATION WITH CORONARY ANGIOGRAM N/A 10/30/2013   Procedure: LEFT HEART CATHETERIZATION WITH CORONARY ANGIOGRAM;  Surgeon: Josue Hector, MD;  Location: [facility] CATH LAB;  Service: Cardiovascular;  Laterality: N/A;  . TUBAL LIGATION      Allergies  Allergen Reactions  . Gadolinium Derivatives     Code: VOM, Desc: Pt began vomiting 45 sec after MRI contrast injection of Multihance, Onset Date: [id number]       Outpatient Medications Prior to Visit  Medication Sig Dispense Refill  . albuterol  (PROVENTIL HFA;VENTOLIN HFA) 108 (90 Base) MCG/ACT inhaler Inhale 1-2 puffs into the lungs every 6 (six) hours as needed for wheezing or shortness of breath. 1 Inhaler 1  . aspirin 81 MG tablet Take 81 mg by mouth daily.    . carvedilol (COREG) 12.5 MG tablet TAKE 1 TABLET BY MOUTH 2 TIMES DAILY WITH A MEAL. 60 tablet 3  . gabapentin (NEURONTIN) 100 MG capsule Take 1 capsule (100 mg total) by mouth 3 (three) times daily. 90 capsule 0  . Insulin Pen Needle (B-D ULTRAFINE III SHORT PEN) 31G X 8 MM MISC Use for nightly insulin injections 100 each 1  . lactulose (CHRONULAC) 10 GM/15ML solution Take 15 mLs (10 g total) by mouth 3 (three) times daily. 946 mL 3  . losartan (COZAAR) 100 MG tablet Take 1 tablet (100 mg total) by mouth daily. 30 tablet 4  . meclizine (ANTIVERT) 25 MG tablet Take 1 tablet (25 mg total) by mouth 3 (three) times daily as needed for dizziness. 30 tablet 0  . traZODone (DESYREL) 50 MG tablet Take 1 tablet (50 mg total) by mouth at bedtime as needed for sleep. 30 tablet 0  . metFORMIN (GLUCOPHAGE) 1000 MG tablet TAKE 1 TABLET BY MOUTH 2 TIMES DAILY WITH A MEAL. 60 tablet 0  . calcium carbonate (TUMS - DOSED IN MG ELEMENTAL CALCIUM) 500 MG chewable tablet Chew 1 tablet by mouth daily. Reported on 05/19/2016    . methylPREDNISolone (MEDROL DOSEPAK) 4 MG TBPK tablet Take 6-5-4-3-2-1 po qd (Patient  not taking: Reported on 12/06/2016) 21 tablet 0  . PRESCRIPTION MEDICATION Take 0.5 tablets by mouth daily. Reported on 05/19/2016    . atorvastatin (LIPITOR) 10 MG tablet Take 1 tablet (10 mg total) by mouth daily. Needs office visit for refills (Patient not taking: Reported on 12/06/2016) 30 tablet 0  . benzonatate (TESSALON) 100 MG capsule Take 1 capsule (100 mg total) by mouth every 8 (eight) hours. (Patient not taking: Reported on 12/06/2016) 21 capsule 0  . Insulin Glargine (LANTUS SOLOSTAR) 100 UNIT/ML Solostar Pen Inject 10 Units into the skin daily at 10 pm. (Patient not taking: Reported  on 12/06/2016) 5 pen PRN  . pantoprazole (PROTONIX) 40 MG tablet Take 1 tablet (40 mg total) by mouth 2 (two) times daily. (Patient not taking: Reported on 12/06/2016) 60 tablet 0  . sulfamethoxazole-trimethoprim (BACTRIM DS,SEPTRA DS) 800-160 MG tablet Take 1 tablet by mouth 2 (two) times daily. (Patient not taking: Reported on 12/01/2016) 14 tablet 0   Facility-Administered Medications Prior to Visit  Medication Dose Route Frequency Provider Last Rate Last Dose  . aspirin tablet 325 mg  325 mg Oral Daily Lance Bosch, NP   325 mg at 09/02/15 1628    ROS Review of Systems  Constitutional: Negative for activity change, appetite change and fatigue.  HENT: Negative for congestion, sinus pressure and sore throat.   Eyes: Negative for visual disturbance.  Respiratory: Positive for cough. Negative for chest tightness, shortness of breath and wheezing.   Cardiovascular: Negative for chest pain and palpitations.  Gastrointestinal: Negative for abdominal distention, abdominal pain and constipation.  Endocrine: Negative for polydipsia.  Genitourinary: Negative for dysuria and frequency.  Musculoskeletal: Negative for arthralgias and back pain.  Skin: Negative for rash.  Neurological: Negative for tremors, light-headedness and numbness.  Hematological: Does not bruise/bleed easily.  Psychiatric/Behavioral: Negative for agitation and behavioral problems.    Objective:  BP 132/72 (BP Location: Right Arm, Patient Position: Sitting, Cuff Size: Small)   Pulse 68   Temp 98.4 F (36.9 C) (Oral)   Ht 5' (1.524 m)   Wt 151 lb 12.8 oz (68.9 kg)   SpO2 97%   BMI 29.65 kg/m   BP/Weight 12/06/2016 [id number]  Systolic BP Q000111Q 0000000 -  Diastolic BP 72 80 -  Wt. (Lbs) 151.8 151.2 147  BMI 29.65 29.53 24.46      Physical Exam  Constitutional: She is oriented to person, place, and time. She appears well-developed and well-nourished.  Cardiovascular: Normal rate, normal heart sounds and intact  distal pulses.   No murmur heard. Pulmonary/Chest: Effort normal and breath sounds normal. She has no wheezes. She has no rales. She exhibits no tenderness.  Abdominal: Soft. Bowel sounds are normal. She exhibits no distension and no mass. There is no tenderness.  Musculoskeletal: Normal range of motion.  Neurological: She is alert and oriented to person, place, and time.    Lab Results  Component Value Date   HGBA1C 8.2 12/06/2016    Assessment & Plan:   1. Diabetes mellitus due to underlying condition with hyperosmolarity without coma, without long-term current use of insulin (HCC) Uncontrolled with A1c of 8.2 Increased dose of Lantus Switch from metformin to glipizide We'll review blood sugar log at next visit - Glucose (CBG) - HgB A1c - Lipid Panel w/reflex Direct LDL; Future - COMPLETE METABOLIC PANEL WITH GFR; Future - Microalbumin / creatinine urine ratio; Future  2. Type 2 diabetes mellitus with diabetic neuropathy, without long-term current use of insulin (HCC) -  Insulin Glargine (LANTUS SOLOSTAR) 100 UNIT/ML Solostar Pen; Inject 15 Units into the skin daily at 10 pm.  Dispense: 5 pen; Refill: 3  3. Cough Placed on Zyrtec If symptoms persist at next visit I will discontinue losartan.  Meds ordered this encounter  Medications  . atorvastatin (LIPITOR) 40 MG tablet    Sig: Take 1 tablet (40 mg total) by mouth daily. Needs office visit for refills    Dispense:  30 tablet    Refill:  3  . Insulin Glargine (LANTUS SOLOSTAR) 100 UNIT/ML Solostar Pen    Sig: Inject 15 Units into the skin daily at 10 pm.    Dispense:  5 pen    Refill:  3  . benzonatate (TESSALON) 100 MG capsule    Sig: Take 1 capsule (100 mg total) by mouth every 8 (eight) hours.    Dispense:  21 capsule    Refill:  0  . cetirizine (ZYRTEC) 10 MG tablet    Sig: Take 1 tablet (10 mg total) by mouth daily.    Dispense:  30 tablet    Refill:  1  . pantoprazole (PROTONIX) 40 MG tablet    Sig: Take 1  tablet (40 mg total) by mouth daily.    Dispense:  30 tablet    Refill:  3    Must have office visit with Dr. Jarold Song  . glipiZIDE (GLUCOTROL) 10 MG tablet    Sig: Take 1 tablet (10 mg total) by mouth 2 (two) times daily before a meal.    Dispense:  60 tablet    Refill:  3    Discontinue metformin    Follow-up: Return in about 1 month (around 01/06/2017) for follow up of cough.   Arnoldo Morale MD

## 2016-12-08 ENCOUNTER — Encounter (HOSPITAL_COMMUNITY): Payer: Self-pay

## 2016-12-08 ENCOUNTER — Encounter (HOSPITAL_COMMUNITY)
Admission: RE | Admit: 2016-12-08 | Discharge: 2016-12-08 | Disposition: A | Discharge status: Home / Self Care | Payer: Self-pay | Source: Ambulatory Visit | Attending: Orthopedic Surgery | Admitting: Orthopedic Surgery

## 2016-12-08 DIAGNOSIS — Z01812 Encounter for preprocedural laboratory examination: Secondary | ICD-10-CM | POA: Insufficient documentation

## 2016-12-08 DIAGNOSIS — E119 Type 2 diabetes mellitus without complications: Secondary | ICD-10-CM | POA: Insufficient documentation

## 2016-12-08 HISTORY — DX: Dyspnea, unspecified: R06.00

## 2016-12-08 HISTORY — DX: Adverse effect of unspecified anesthetic, initial encounter: T41.45XA

## 2016-12-08 HISTORY — DX: Bell's palsy: G51.0

## 2016-12-08 HISTORY — DX: Major depressive disorder, single episode, unspecified: F32.9

## 2016-12-08 HISTORY — DX: Depression, unspecified: F32.A

## 2016-12-08 HISTORY — DX: Other complications of anesthesia, initial encounter: T88.59XA

## 2016-12-08 LAB — COMPREHENSIVE METABOLIC PANEL
ALBUMIN: 3.9 g/dL (ref 3.5–5.0)
ALK PHOS: 85 U/L (ref 38–126)
ALT: 53 U/L (ref 14–54)
ANION GAP: 11 (ref 5–15)
AST: 75 U/L — ABNORMAL HIGH (ref 15–41)
BILIRUBIN TOTAL: 0.9 mg/dL (ref 0.3–1.2)
BUN: 11 mg/dL (ref 6–20)
CALCIUM: 9.7 mg/dL (ref 8.9–10.3)
CO2: 22 mmol/L (ref 22–32)
CREATININE: 0.69 mg/dL (ref 0.44–1.00)
Chloride: 104 mmol/L (ref 101–111)
GFR calc Af Amer: >60 mL/min (ref >60)
GFR calc non Af Amer: >60 mL/min (ref >60)
GLUCOSE: 274 mg/dL — AB (ref 65–99)
Potassium: 4 mmol/L (ref 3.5–5.1)
Sodium: 137 mmol/L (ref 135–145)
TOTAL PROTEIN: 7.3 g/dL (ref 6.5–8.1)

## 2016-12-08 LAB — CBC
HEMATOCRIT: 41.6 % (ref 36.0–46.0)
HEMOGLOBIN: 14.4 g/dL (ref 12.0–15.0)
MCH: 30.1 pg (ref 26.0–34.0)
MCHC: 34.6 g/dL (ref 30.0–36.0)
MCV: 87 fL (ref 78.0–100.0)
Platelets: 180 10*3/uL (ref 150–400)
RBC: 4.78 MIL/uL (ref 3.87–5.11)
RDW: 12.8 % (ref 11.5–15.5)
WBC: 6.5 10*3/uL (ref 4.0–10.5)

## 2016-12-08 LAB — GLUCOSE, CAPILLARY: Glucose-Capillary: 349 mg/dL — ABNORMAL HIGH (ref 65–99)

## 2016-12-08 NOTE — Pre-Procedure Instructions (Addendum)
Sena Zamora-Quintanilla  12/08/2016   Your procedure is scheduled on Wednesday, January 24.  Report to [facility] Admitting at 8:40 AM               Your surgery or procedure is scheduled for 10:40 AM   Call this number if you have problems the morning of surgery:[phone]                For any other questions, please call [phone], Monday - Friday 8 AM - 4 PM.    Remember:  Do not eat food or drink liquids after midnight : Tuesday, January  23.  Take these medicines the morning of surgery with A SIP OF WATER :atorvastatin (LIPITOR),  carvedilol (COREG) , gabapentin (NEURONTIN).                DO NOT take Glipizide the evening before surgery or the morning of surgery.               Take if needed : Tessalon, Antivert.                 May use Albuterol inhaler and please bring it to the hosppital with you.            1 Week prior to surgery STOP taking Aspirin  Aspirin Products (Goody Powder, Excedrin Migraine), Ibuprofen (Advil), Naproxen (Aleve), Vitamins and Herbal Products (ie Fish Oil)  How to Manage Your Diabetes Before and After Surgery  Why is it important to control my blood sugar before and after surgery? . Improving blood sugar levels before and after surgery helps healing and can limit problems. . A way of improving blood sugar control is eating a healthy diet by: o  Eating less sugar and carbohydrates o  Increasing activity/exercise o  Talking with your doctor about reaching your blood sugar goals . High blood sugars (greater than 180 mg/dL) can raise your risk of infections and slow your recovery, so you will need to focus on controlling your diabetes during the weeks before surgery. . Make sure that the doctor who takes care of your diabetes knows about your planned surgery including the date and location.  How do I manage my blood sugar before surgery? . Check your blood sugar at least 4 times a day, starting 2 days before surgery, to make sure  that the level is not too high or low. o Check your blood sugar the morning of your surgery when you wake up and every 2 hours until you get to the Short Stay unit. . If your blood sugar is less than 70 mg/dL, you will need to treat for low blood sugar: o Do not take insulin. o Treat a low blood sugar (less than 70 mg/dL) with  cup of clear juice (cranberry or apple), 4 glucose tablets, OR glucose gel. o Recheck blood sugar in 15 minutes after treatment (to make sure it is greater than 70 mg/dL). If your blood sugar is not greater than 70 mg/dL on recheck, call [phone] for further instructions. . Report your blood sugar to the short stay nurse when you get to Short Stay.  . If you are admitted to the hospital after surgery: o Your blood sugar will be checked by the staff and you will probably be given insulin after surgery (instead of oral diabetes medicines) to make sure you have good blood sugar levels. o The goal for blood sugar control after surgery is 80-180 mg/dL.  WhAT DO I DO ABOUT MY DIABETES MEDICATION?  Marland Kitchen Do not take oral diabetes medicines (pills) the morning of surgery.  . THE NIGHT BEFORE SURGERY, take 7_ units of Lantus insulin.       Do not wear jewelry, make-up or nail polish.  Do not wear lotions, powders, or perfumes, or deodorant.  Do not shave 48 hours prior to surgery.    Do not bring valuables to the hospital.  [facility] is not responsible for any belongings or valuables.  Contacts, dentures or bridgework may not be worn into surgery.  Leave your suitcase in the car.  After surgery it may be brought to your room.  For patients admitted to the hospital, discharge time will be determined by your treatment team.  Special instructions:  Review  [place] - Preparing For Surgery.  Please read over the following fact sheets that you were given: [facility]- Preparing For Surgery and Patient Instructions for Mupirocin Application, Incentive Spirometry, Pain  Booklet

## 2016-12-08 NOTE — Progress Notes (Signed)
[name] through interpreter [place] that she forgot to take Lantus Insulin last night.  CNG this morning was 349.  Last A1C was 8.2 drawn 12/06/16.  Patient's PCP is changing patient's po DM medications- per notes to Glipizide., patient is picking up medication today. (Metformin causes severe diarrhea, patient reports that she stills has been taking it.)  Patient reports that she checks CBG 2 times a day and it has not been high like it was today.   Patient;s son is present and speaks and reads english, he is to call me this afternoon to confirm the name of the new medication for diabetes.  I put in instructions that Glipizide to be held the evening prior to surgery and the morning of surgery.  Is medication is not Glipizide I will instruct as approximate.

## 2016-12-08 NOTE — Progress Notes (Signed)
PAT appointment completed sing Interpreter- Mar[place]

## 2016-12-09 MED FILL — !LANTUS SOLOSTAR 100UNITS/M: 100 | 20 days supply | Qty: 3 | Fill #0

## 2016-12-09 NOTE — Progress Notes (Signed)
Anesthesia chart review: Patient is a 61 year old female scheduled for right ankle arthroscopic debridement on 12/14/2016 by Dr. Sharol Given. Diagnosis impingement right ankle. Interpretor requested.   History includes non-smoker, HTN, left breast cancer s/p lumpectomy and chemotherapy '10, DM2, CAD (non-obstructive '14) with chest pain (normal stress test 10/2016), Bell's palsy, dyspnea, hepatic steatosis, cholecystectomy 12/09/2015. For anesthesia history, she reported weakness (couldn't stand for a day) after tubal ligation in [place] (not sure if procedure was done with general or regional anesthesia).  PCP is Dr. Arnoldo Morale, last visit 12/06/16 with lab appointment (lipids, CMET, microalbumin/creatinine urine ratio) scheduled for 12/13/16.  Cardiologist is Dr. Ena Dawley, last visit 11/03/16 for evaluation of chest pain with known non-occlusive CAD and 2014 CT with coronary calcifications including left main. She had a subsequent normal stress test.  Meds include albuterol, aspirin, Lipitor, Coreg, Zyrtec, Neurontin, glipizide (started 12/08/16), Lantus (increased to 15 Units Q HS 12/06/16), losartan, meclizine, metformin (discontinued 12/08/16), Protonix, trazodone.  BP (!) 156/72   Pulse 74   Temp 36.7 C   Resp 20   Ht 4' 11.5" (1.511 m)   Wt 149 lb 1.6 oz (67.6 kg)   SpO2 98%   BMI 29.61 kg/m   EKG 11/02/16: NSR.  Nuclear stress test 11/11/16:  Nuclear stress EF: 67%.  Blood pressure demonstrated a normal response to exercise.  There was no ST segment deviation noted during stress.  The study is normal.  This is a low risk study.  The left ventricular ejection fraction is hyperdynamic (>65%).  08/27/14-09/03/14 Holter monitor: Normal holter. HR 59-111, AHR 81 bpm.  Cardiac cath 10/30/13 (Dr. Jenkins Rouge): Coronary dominance: right Left mainstem:  Normal Left anterior descending (LAD): 30% calcified disease proximally             D1: 30--40% mid vessel disease    D2:  50% mid vessel disease             D3: small normal Left circumflex (LCx):  Normal              OM1: large vessel normal Right coronary artery (RCA):  Large dominant vessel normal Left ventriculography: Left ventricular systolic function is normal, LVEF is estimated at 55-65%, there is no significant mitral regurgitation  Final Conclusions:   Films reviewed with Dr [place] He agrees high diagonal vessels are not flow limiting Continue to Rx risk factors Appears to have noncardiac chest pain Recommendations: Medical Rx.   Preoperative labs noted. CBC WNL. CR 0.69. AST 75 (mildly elevated in the 40's 11/2015)). ALT 52, total bilirubin 75. Glucose 274, however patient reported she forgot to take Lantus the night before. A1c was 8.2 on 12/06/16, and DM medications adjusted at that time. She will get fasting CBG on arrival.  If fasting glucose result acceptable and otherwise no acute changes then I would anticipate that she could proceed as planned.  George Hugh [facility] Short Stay Center/Anesthesiology Phone [phone] 12/09/2016 6:15 PM

## 2016-12-12 NOTE — Progress Notes (Addendum)
I spoke with Heywood Iles, patienmt's daughter.  Heywood Iles reported that patient is now taking Gilpizide 10mg  2 times a day and not taking Metformin.  Insulin remains the same, Lantus 15 units at bedtime.  AM CBG today is 233.  Patients daughter reports tjat they are trying to get patient to only drink water.  I encouraged her to have patient check CBG's 4 times a day to see if she can identify something that she is eating that is causing CBG to be elevated.  Gisseal repeated that patiet is to not take Glipizipe the evening before surgery or the day of surgery and that patient should take 1/2 of scheduled bedtime dose of Lantus Insuilin the night before sure surgery - 78 units.

## 2016-12-13 ENCOUNTER — Ambulatory Visit: Payer: Self-pay | Attending: Family Medicine

## 2016-12-13 DIAGNOSIS — E0800 Diabetes mellitus due to underlying condition with hyperosmolarity without nonketotic hyperglycemic-hyperosmolar coma (NKHHC): Secondary | ICD-10-CM | POA: Insufficient documentation

## 2016-12-13 LAB — LIPID PANEL W/REFLEX DIRECT LDL
Cholesterol: 120 mg/dL (ref <200)
HDL: 37 mg/dL — ABNORMAL LOW (ref >50)
LDL-Cholesterol: 60 mg/dL
Non-HDL Cholesterol (Calc): 83 mg/dL (ref <130)
Total CHOL/HDL Ratio: 3.2 Ratio (ref <5.0)
Triglycerides: 149 mg/dL (ref <150)

## 2016-12-13 LAB — COMPLETE METABOLIC PANEL WITH GFR
ALBUMIN: 3.8 g/dL (ref 3.6–5.1)
ALK PHOS: 83 U/L (ref 33–130)
ALT: 35 U/L — ABNORMAL HIGH (ref 6–29)
AST: 40 U/L — AB (ref 10–35)
BILIRUBIN TOTAL: 1 mg/dL (ref 0.2–1.2)
BUN: 14 mg/dL (ref 7–25)
CO2: 23 mmol/L (ref 20–31)
Calcium: 9.2 mg/dL (ref 8.6–10.4)
Chloride: 105 mmol/L (ref 98–110)
Creat: 0.68 mg/dL (ref 0.50–1.05)
GFR, Est African American: >89 mL/min (ref >=60)
GLUCOSE: 228 mg/dL — AB (ref 65–99)
POTASSIUM: 4 mmol/L (ref 3.5–5.3)
SODIUM: 140 mmol/L (ref 135–146)
TOTAL PROTEIN: 6.8 g/dL (ref 6.1–8.1)

## 2016-12-13 NOTE — Anesthesia Preprocedure Evaluation (Addendum)
Anesthesia Evaluation  Patient identified by MRN, date of birth, ID band Patient awake    Reviewed: Allergy & Precautions, H&P , NPO status , Patient's Chart, lab work & pertinent test results  Airway Mallampati: II  TM Distance: >3 FB Neck ROM: Full    Dental no notable dental hx. (+) Upper Dentures, Dental Advisory Given, Lower Dentures   Pulmonary neg pulmonary ROS,    Pulmonary exam normal breath sounds clear to auscultation       Cardiovascular Exercise Tolerance: Good hypertension, Pt. on medications and Pt. on home beta blockers + CAD   Rhythm:Regular Rate:Normal     Neuro/Psych  Headaches, Depression TIA   GI/Hepatic negative GI ROS, Neg liver ROS,   Endo/Other  diabetes, Insulin Dependent  Renal/GU negative Renal ROS  negative genitourinary   Musculoskeletal   Abdominal   Peds  Hematology negative hematology ROS (+)   Anesthesia Other Findings   Reproductive/Obstetrics negative OB ROS                           Anesthesia Physical Anesthesia Plan  ASA: III  Anesthesia Plan: General   Post-op Pain Management:    Induction: Intravenous  Airway Management Planned: LMA  Additional Equipment:   Intra-op Plan:   Post-operative Plan: Extubation in OR  Informed Consent: I have reviewed the patients History and Physical, chart, labs and discussed the procedure including the risks, benefits and alternatives for the proposed anesthesia with the patient or authorized representative who has indicated his/her understanding and acceptance.   Dental advisory given  Plan Discussed with: CRNA  Anesthesia Plan Comments:         Anesthesia Quick Evaluation

## 2016-12-13 NOTE — Progress Notes (Signed)
Patient here for lab visit only 

## 2016-12-14 ENCOUNTER — Ambulatory Visit (HOSPITAL_COMMUNITY): Payer: Self-pay | Admitting: Anesthesiology

## 2016-12-14 ENCOUNTER — Ambulatory Visit (HOSPITAL_COMMUNITY)
Admission: RE | Admit: 2016-12-14 | Discharge: 2016-12-14 | Disposition: A | Discharge status: Home / Self Care | Payer: Self-pay | Source: Ambulatory Visit | Attending: Orthopedic Surgery | Admitting: Orthopedic Surgery

## 2016-12-14 ENCOUNTER — Encounter (HOSPITAL_COMMUNITY)
Admission: RE | Disposition: A | Discharge status: Home / Self Care | Payer: Self-pay | Source: Ambulatory Visit | Attending: Orthopedic Surgery

## 2016-12-14 ENCOUNTER — Ambulatory Visit (HOSPITAL_COMMUNITY): Payer: Self-pay | Admitting: Vascular Surgery

## 2016-12-14 ENCOUNTER — Encounter (HOSPITAL_COMMUNITY): Payer: Self-pay | Admitting: Anesthesiology

## 2016-12-14 DIAGNOSIS — M7751 Other enthesopathy of right foot: Secondary | ICD-10-CM

## 2016-12-14 DIAGNOSIS — Z820 Family history of epilepsy and other diseases of the nervous system: Secondary | ICD-10-CM | POA: Insufficient documentation

## 2016-12-14 DIAGNOSIS — Z9851 Tubal ligation status: Secondary | ICD-10-CM | POA: Insufficient documentation

## 2016-12-14 DIAGNOSIS — Z8249 Family history of ischemic heart disease and other diseases of the circulatory system: Secondary | ICD-10-CM | POA: Insufficient documentation

## 2016-12-14 DIAGNOSIS — Z853 Personal history of malignant neoplasm of breast: Secondary | ICD-10-CM | POA: Insufficient documentation

## 2016-12-14 DIAGNOSIS — Z808 Family history of malignant neoplasm of other organs or systems: Secondary | ICD-10-CM | POA: Insufficient documentation

## 2016-12-14 DIAGNOSIS — M65871 Other synovitis and tenosynovitis, right ankle and foot: Secondary | ICD-10-CM | POA: Insufficient documentation

## 2016-12-14 DIAGNOSIS — M25871 Other specified joint disorders, right ankle and foot: Secondary | ICD-10-CM | POA: Insufficient documentation

## 2016-12-14 DIAGNOSIS — Z833 Family history of diabetes mellitus: Secondary | ICD-10-CM | POA: Insufficient documentation

## 2016-12-14 DIAGNOSIS — E1140 Type 2 diabetes mellitus with diabetic neuropathy, unspecified: Secondary | ICD-10-CM | POA: Insufficient documentation

## 2016-12-14 DIAGNOSIS — Z923 Personal history of irradiation: Secondary | ICD-10-CM | POA: Insufficient documentation

## 2016-12-14 DIAGNOSIS — Z888 Allergy status to other drugs, medicaments and biological substances status: Secondary | ICD-10-CM | POA: Insufficient documentation

## 2016-12-14 DIAGNOSIS — Z8669 Personal history of other diseases of the nervous system and sense organs: Secondary | ICD-10-CM | POA: Insufficient documentation

## 2016-12-14 DIAGNOSIS — Z9889 Other specified postprocedural states: Secondary | ICD-10-CM | POA: Insufficient documentation

## 2016-12-14 DIAGNOSIS — K760 Fatty (change of) liver, not elsewhere classified: Secondary | ICD-10-CM | POA: Insufficient documentation

## 2016-12-14 DIAGNOSIS — Z9049 Acquired absence of other specified parts of digestive tract: Secondary | ICD-10-CM | POA: Insufficient documentation

## 2016-12-14 DIAGNOSIS — F329 Major depressive disorder, single episode, unspecified: Secondary | ICD-10-CM | POA: Insufficient documentation

## 2016-12-14 DIAGNOSIS — I10 Essential (primary) hypertension: Secondary | ICD-10-CM | POA: Insufficient documentation

## 2016-12-14 DIAGNOSIS — I2510 Atherosclerotic heart disease of native coronary artery without angina pectoris: Secondary | ICD-10-CM | POA: Insufficient documentation

## 2016-12-14 DIAGNOSIS — Z9221 Personal history of antineoplastic chemotherapy: Secondary | ICD-10-CM | POA: Insufficient documentation

## 2016-12-14 HISTORY — PX: ANKLE ARTHROSCOPY: SHX545

## 2016-12-14 LAB — MICROALBUMIN / CREATININE URINE RATIO
Creatinine, Urine: 128 mg/dL (ref 20–320)
MICROALB/CREAT RATIO: 6 ug/mg{creat} (ref <30)
Microalb, Ur: 0.8 mg/dL

## 2016-12-14 LAB — GLUCOSE, CAPILLARY
GLUCOSE-CAPILLARY: 245 mg/dL — AB (ref 65–99)
Glucose-Capillary: 217 mg/dL — ABNORMAL HIGH (ref 65–99)
Glucose-Capillary: 227 mg/dL — ABNORMAL HIGH (ref 65–99)
Glucose-Capillary: 233 mg/dL — ABNORMAL HIGH (ref 65–99)

## 2016-12-14 SURGERY — ARTHROSCOPY, ANKLE
Anesthesia: General | Site: Ankle | Laterality: Right

## 2016-12-14 MED ORDER — FENTANYL CITRATE (PF) 100 MCG/2ML IJ SOLN
INTRAMUSCULAR | Status: DC | PRN
  Administered 2016-12-14: 100 ug via INTRAVENOUS

## 2016-12-14 MED ORDER — MIDAZOLAM HCL 5 MG/5ML IJ SOLN
INTRAMUSCULAR | Status: DC | PRN
  Administered 2016-12-14: 2 mg via INTRAVENOUS

## 2016-12-14 MED ORDER — LACTATED RINGERS IV SOLN
INTRAVENOUS | Status: DC
  Administered 2016-12-14 (×2): via INTRAVENOUS

## 2016-12-14 MED ORDER — BUPIVACAINE HCL (PF) 0.25 % IJ SOLN
INTRAMUSCULAR | Status: AC
  Filled 2016-12-14: qty 30

## 2016-12-14 MED ORDER — SODIUM CHLORIDE 0.9 % IR SOLN
Status: DC | PRN
  Administered 2016-12-14: 3000 mL

## 2016-12-14 MED ORDER — HYDROMORPHONE HCL 1 MG/ML IJ SOLN
INTRAMUSCULAR | Status: AC
  Filled 2016-12-14: qty 0.5

## 2016-12-14 MED ORDER — MIDAZOLAM HCL 2 MG/2ML IJ SOLN
INTRAMUSCULAR | Status: AC
  Filled 2016-12-14: qty 2

## 2016-12-14 MED ORDER — PROPOFOL 10 MG/ML IV BOLUS
INTRAVENOUS | Status: AC
  Filled 2016-12-14: qty 20

## 2016-12-14 MED ORDER — PROPOFOL 10 MG/ML IV BOLUS
INTRAVENOUS | Status: DC | PRN
  Administered 2016-12-14: 100 mg via INTRAVENOUS

## 2016-12-14 MED ORDER — INSULIN ASPART 100 UNIT/ML ~~LOC~~ SOLN
SUBCUTANEOUS | Status: AC
  Filled 2016-12-14: qty 1

## 2016-12-14 MED ORDER — HYDROMORPHONE HCL 1 MG/ML IJ SOLN
0.2500 mg | INTRAMUSCULAR | Status: DC | PRN
  Administered 2016-12-14 (×2): 0.5 mg via INTRAVENOUS

## 2016-12-14 MED ORDER — INSULIN ASPART 100 UNIT/ML ~~LOC~~ SOLN
6.0000 [IU] | Freq: Once | SUBCUTANEOUS | Status: AC
  Administered 2016-12-14: 6 [IU] via SUBCUTANEOUS

## 2016-12-14 MED ORDER — HYDROCODONE-ACETAMINOPHEN 5-325 MG PO TABS: 1.0000 | ORAL_TABLET | Freq: Four times a day (QID) | ORAL | 0 refills | Status: DC | PRN

## 2016-12-14 MED ORDER — CEFAZOLIN SODIUM-DEXTROSE 2-4 GM/100ML-% IV SOLN
2.0000 g | INTRAVENOUS | Status: AC
  Administered 2016-12-14: 2 g via INTRAVENOUS
  Filled 2016-12-14: qty 100

## 2016-12-14 MED ORDER — FENTANYL CITRATE (PF) 100 MCG/2ML IJ SOLN
INTRAMUSCULAR | Status: AC
  Filled 2016-12-14: qty 4

## 2016-12-14 MED ORDER — CHLORHEXIDINE GLUCONATE 4 % EX LIQD: 60.0000 mL | Freq: Once | CUTANEOUS | Status: DC

## 2016-12-14 MED ORDER — CARVEDILOL 12.5 MG PO TABS
ORAL_TABLET | ORAL | Status: AC
  Administered 2016-12-14: 12.5 mg
  Filled 2016-12-14: qty 1

## 2016-12-14 MED ORDER — LIDOCAINE HCL (CARDIAC) 20 MG/ML IV SOLN
INTRAVENOUS | Status: DC | PRN
  Administered 2016-12-14: 60 mg via INTRAVENOUS

## 2016-12-14 MED ORDER — ONDANSETRON HCL 4 MG/2ML IJ SOLN
INTRAMUSCULAR | Status: DC | PRN
  Administered 2016-12-14: 4 mg via INTRAVENOUS

## 2016-12-14 MED ORDER — DEXAMETHASONE SODIUM PHOSPHATE 10 MG/ML IJ SOLN
INTRAMUSCULAR | Status: DC | PRN
  Administered 2016-12-14: 10 mg via INTRAVENOUS

## 2016-12-14 SURGICAL SUPPLY — 44 items
BLADE CUDA 5.5 (BLADE) IMPLANT
BLADE GREAT WHITE 4.2 (BLADE) IMPLANT
BLADE GREAT WHITE 4.2MM (BLADE)
BLADE INCISOR PLUS 5.5 (BLADE) IMPLANT
BNDG COHESIVE 6X5 TAN STRL LF (GAUZE/BANDAGES/DRESSINGS) ×3 IMPLANT
BNDG GAUZE ELAST 4 BULKY (GAUZE/BANDAGES/DRESSINGS) ×2 IMPLANT
BUR OVAL 4.0 (BURR) IMPLANT
COVER SURGICAL LIGHT HANDLE (MISCELLANEOUS) ×6 IMPLANT
CUFF TOURNIQUET SINGLE 34IN LL (TOURNIQUET CUFF) IMPLANT
CUFF TOURNIQUET SINGLE 44IN (TOURNIQUET CUFF) IMPLANT
DRAPE ARTHROSCOPY W/POUCH 114 (DRAPES) ×3 IMPLANT
DRAPE C-ARM MINI 42X72 WSTRAPS (DRAPES) IMPLANT
DRAPE U-SHAPE 47X51 STRL (DRAPES) ×3 IMPLANT
DRSG EMULSION OIL 3X3 NADH (GAUZE/BANDAGES/DRESSINGS) ×3 IMPLANT
DRSG PAD ABDOMINAL 8X10 ST (GAUZE/BANDAGES/DRESSINGS) ×3 IMPLANT
DURAPREP 26ML APPLICATOR (WOUND CARE) ×3 IMPLANT
GAUZE SPONGE 4X4 12PLY STRL (GAUZE/BANDAGES/DRESSINGS) ×3 IMPLANT
GLOVE BIOGEL PI IND STRL 9 (GLOVE) ×1 IMPLANT
GLOVE BIOGEL PI INDICATOR 9 (GLOVE) ×2
GLOVE SURG ORTHO 9.0 STRL STRW (GLOVE) ×3 IMPLANT
GOWN STRL REUS W/ TWL XL LVL3 (GOWN DISPOSABLE) ×3 IMPLANT
GOWN STRL REUS W/TWL XL LVL3 (GOWN DISPOSABLE) ×9
KIT BASIN OR (CUSTOM PROCEDURE TRAY) ×3 IMPLANT
KIT ROOM TURNOVER OR (KITS) ×3 IMPLANT
MANIFOLD NEPTUNE II (INSTRUMENTS) ×3 IMPLANT
NDL 18GX1X1/2 (RX/OR ONLY) (NEEDLE) ×1 IMPLANT
NEEDLE 18GX1X1/2 (RX/OR ONLY) (NEEDLE) ×3 IMPLANT
PACK ARTHROSCOPY DSU (CUSTOM PROCEDURE TRAY) ×3 IMPLANT
PAD ARMBOARD 7.5X6 YLW CONV (MISCELLANEOUS) ×6 IMPLANT
PADDING CAST COTTON 6X4 STRL (CAST SUPPLIES) ×3 IMPLANT
SET ARTHROSCOPY TUBING (MISCELLANEOUS) ×3
SET ARTHROSCOPY TUBING LN (MISCELLANEOUS) ×1 IMPLANT
SPONGE GAUZE 4X4 12PLY STER LF (GAUZE/BANDAGES/DRESSINGS) ×2 IMPLANT
SPONGE LAP 4X18 X RAY DECT (DISPOSABLE) ×3 IMPLANT
SUT ETHILON 4 0 PS 2 18 (SUTURE) ×3 IMPLANT
SUT MNCRL AB 3-0 PS2 18 (SUTURE) IMPLANT
SUT VIC AB 2-0 CT1 27 (SUTURE)
SUT VIC AB 2-0 CT1 TAPERPNT 27 (SUTURE) IMPLANT
SYR 20CC LL (SYRINGE) ×3 IMPLANT
TAPE STRIPS DRAPE STRL (GAUZE/BANDAGES/DRESSINGS) IMPLANT
TOWEL OR 17X24 6PK STRL BLUE (TOWEL DISPOSABLE) ×3 IMPLANT
TOWEL OR 17X26 10 PK STRL BLUE (TOWEL DISPOSABLE) ×3 IMPLANT
WAND HAND CNTRL MULTIVAC 90 (MISCELLANEOUS) IMPLANT
WATER STERILE IRR 1000ML POUR (IV SOLUTION) ×3 IMPLANT

## 2016-12-14 NOTE — H&P (Signed)
Brandi Bryant is an 61 y.o. female.   Chief Complaint: Right ankle impingement pain HPI: Patient is a 61 year old woman diabetic insensate neuropathy who has had impingement symptoms of her right ankle she has failed conservative therapy and presents at this time for arthroscopic intervention.  Past Medical History:  Diagnosis Date  . Abdominal pain   . Arthralgia 08/13/2013  . Bell palsy 2006  . Bell's palsy   . Breast cancer ([name])    Breast - surgery, chemo and radiation  . Chest pain 09/30/2013  . Cholelithiasis   . Colon cancer screening 10/27/2015  . Complication of anesthesia    " tubal ligation, in [place]"  had weakness, couldnt stand up the next day, was given pills for oxygen for Brain."  1 month after I was having loss of attentiviness, still have to focus  carefully.   . Coronary artery disease   . Depression   . Diabetes mellitus    Type II  . Dyspnea    at times- when air conditioner is runnimg, heat also   . Encounter for screening colonoscopy 10/30/2015  . Hepatic steatosis   . History of breast cancer 06/01/2012  . HTN (hypertension)     Past Surgical History:  Procedure Laterality Date  . Arm surgery     Left tendon lengthen  . BREAST LUMPECTOMY Left 2010  . CHOLECYSTECTOMY N/A 12/09/2015   Procedure: LAPAROSCOPIC CHOLECYSTECTOMY WITH INTRAOPERATIVE CHOLANGIOGRAM;  Surgeon: Greer Pickerel, MD;  Location: [facility];  Service: General;  Laterality: N/A;  . LEFT HEART CATHETERIZATION WITH CORONARY ANGIOGRAM N/A 10/30/2013   Procedure: LEFT HEART CATHETERIZATION WITH CORONARY ANGIOGRAM;  Surgeon: Josue Hector, MD;  Location: [facility] CATH LAB;  Service: Cardiovascular;  Laterality: N/A;  . porta cath Right   . Removal of Porta cath Right   . TUBAL LIGATION      Family History  Problem Relation Age of Onset  . Thyroid cancer Sister   . Hypertension Mother   . Diabetes Mother   . Migraines Daughter    Social History:  reports that she has never smoked. She has  never used smokeless tobacco. She reports that she does not drink alcohol or use drugs.  Allergies:  Allergies  Allergen Reactions  . Gadolinium Derivatives Nausea And Vomiting    Code: VOM, Desc: Pt began vomiting 45 sec after MRI contrast injection of Multihance, Onset Date: [id number]      No prescriptions prior to admission.    Results for orders placed or performed in visit on 12/13/16 (from the past 48 hour(s))  Lipid Panel w/reflex Direct LDL     Status: Abnormal   Collection Time: 12/13/16  8:41 AM  Result Value Ref Range   Cholesterol 120 <200 mg/dL   Triglycerides 149 <150 mg/dL   HDL 37 (L) >50 mg/dL   Total CHOL/HDL Ratio 3.2 <5.0 Ratio   Non-HDL Cholesterol (Calc) 83 <130 mg/dL    Comment:   For patients with diabetes plus 1 major ASCVD risk factor, treating to a non-HDL-C goal of <100 mg/dL (LDL-C of <70 mg/dL) is considered a therapeutic option.      LDL-Cholesterol 60 mg/dL    Comment: Reference range: <100   Desirable range <100 mg/dL for patients with CHD or diabetes and <70 mg/dL for diabetic patients with known heart disease.     The [name] calculation is a validated novel method that provides better accuracy than the Friedwald equation in the estimation of LDL-C, particulary when TG levels are  150-400 mg/dL and LDL-C levels are lower than 70 mg/dL. Reference:  Cresenciano Genre et al.  Comparison of a Novel Method vs the [name] for Estimating Low-Density Lipoprotein Cholesterol Levels From the Standard Lipid Profile.  JAMA. MU:7466844): 2061-2068.   For additional information, please refer to http://education.[facility].com/faq/FAQ164 (This link is being provided for informational/educational purposes only.)   COMPLETE METABOLIC PANEL WITH GFR     Status: Abnormal   Collection Time: 12/13/16  8:41 AM  Result Value Ref Range   Sodium 140 135 - 146 mmol/L   Potassium 4.0 3.5 - 5.3 mmol/L   Chloride 105 98 - 110 mmol/L   CO2 23  20 - 31 mmol/L   Glucose, Bld 228 (H) 65 - 99 mg/dL   BUN 14 7 - 25 mg/dL   Creat 0.68 0.50 - 1.05 mg/dL    Comment:   For patients > or = 60 years of age: The upper reference limit for Creatinine is approximately 13% higher for people identified as African-American.      Total Bilirubin 1.0 0.2 - 1.2 mg/dL   Alkaline Phosphatase 83 33 - 130 U/L   AST 40 (H) 10 - 35 U/L   ALT 35 (H) 6 - 29 U/L   Total Protein 6.8 6.1 - 8.1 g/dL   Albumin 3.8 3.6 - 5.1 g/dL   Calcium 9.2 8.6 - 10.4 mg/dL   GFR, Est African American >89 >=60 mL/min   GFR, Est Non African American >89 >=60 mL/min  Microalbumin / creatinine urine ratio     Status: None   Collection Time: 12/13/16  8:41 AM  Result Value Ref Range   Creatinine, Urine 128 20 - 320 mg/dL   Microalb, Ur 0.8 Not estab mg/dL   Microalb Creat Ratio 6 <30 mcg/mg creat    Comment: The ADA has defined abnormalities in albumin excretion as follows:           Category           Result                            (mcg/mg creatinine)                 Normal:    <30       Microalbuminuria:    30 - 299   Clinical albuminuria:    > or = 300   The ADA recommends that at least two of three specimens collected within a 3 - 6 month period be abnormal before considering a patient to be within a diagnostic category.      No results found.  Review of Systems  All other systems reviewed and are negative.   There were no vitals taken for this visit. Physical Exam  Examination patient is alert oriented no adenopathy well-dressed normal affect numerous to effort she does have a palpable pulse. Her ankle is tender to palpation anteriorly maximum plantarflexion and dorsiflexion reproduces her pain. Radiographs shows no significant arthritic changes. Assessment/Plan Assessment: Diabetic insensate neuropathy with impingement right ankle.  Plan: We'll plan for right ankle arthroscopy with debridement and decompression. Risk and benefits were discussed  including infection neurovascular injury persistent pain and need for additional surgery. Patient states she understands wish to proceed at this time.  Newt Minion, MD 12/14/2016, 6:48 AM

## 2016-12-14 NOTE — Transfer of Care (Signed)
Immediate Anesthesia Transfer of Care Note  Patient: Brandi Bryant  Procedure(s) Performed: Procedure(s): Right Ankle Arthroscopic Debridement (Right)  Patient Location: PACU  Anesthesia Type:General  Level of Consciousness: awake, alert , oriented and patient cooperative  Airway & Oxygen Therapy: Patient Spontanous Breathing and Patient connected to nasal cannula oxygen  Post-op Assessment: Report given to RN and Post -op Vital signs reviewed and stable  Post vital signs: Reviewed and stable  Last Vitals:  Vitals:   12/14/16 0846 12/14/16 1035  BP: (!) 149/67   Pulse: 61   Resp: 20   Temp: 37.1 C (P) 36.6 C    Last Pain:  Vitals:   12/14/16 0846  TempSrc: Oral         Complications: No apparent anesthesia complications

## 2016-12-14 NOTE — Progress Notes (Signed)
Orthopedic Tech Progress Note Patient Details:  Brandi Bryant November 30, 1955 [id number]  Ortho Devices Type of Ortho Device: Postop shoe/boot Ortho Device/Splint Location: rle Ortho Device/Splint Interventions: Application   Shalini Mair 12/14/2016, 11:28 AM

## 2016-12-14 NOTE — Anesthesia Procedure Notes (Signed)
Procedure Name: LMA Insertion Date/Time: 12/14/2016 10:00 AM Performed by: Laretta Alstrom Pre-anesthesia Checklist: Patient identified, Emergency Drugs available, Suction available, Patient being monitored and Timeout performed Patient Re-evaluated:Patient Re-evaluated prior to inductionOxygen Delivery Method: Circle system utilized Preoxygenation: Pre-oxygenation with 100% oxygen Intubation Type: IV induction LMA: LMA inserted LMA Size: 4.0 Number of attempts: 1 Placement Confirmation: positive ETCO2 and breath sounds checked- equal and bilateral Tube secured with: Tape Dental Injury: Teeth and Oropharynx as per pre-operative assessment

## 2016-12-14 NOTE — Anesthesia Postprocedure Evaluation (Signed)
Anesthesia Post Note  Patient: Brandi Bryant  Procedure(s) Performed: Procedure(s) (LRB): Right Ankle Arthroscopic Debridement (Right)  Patient location during evaluation: PACU Anesthesia Type: General Level of consciousness: awake and alert Pain management: pain level controlled Vital Signs Assessment: post-procedure vital signs reviewed and stable Respiratory status: spontaneous breathing, nonlabored ventilation and respiratory function stable Cardiovascular status: blood pressure returned to baseline and stable Postop Assessment: no signs of nausea or vomiting Anesthetic complications: no       Last Vitals:  Vitals:   12/14/16 1220 12/14/16 1230  BP: (!) 117/58   Pulse: (!) 52 (!) 53  Resp: 15 12  Temp:      Last Pain:  Vitals:   12/14/16 1145  TempSrc:   PainSc: Asleep                 Haifa Hatton,W. EDMOND

## 2016-12-14 NOTE — Op Note (Signed)
12/14/2016  10:33 AM  PATIENT:  Brandi Bryant    PRE-OPERATIVE DIAGNOSIS:  Impingement Right Ankle  POST-OPERATIVE DIAGNOSIS:  Same  PROCEDURE:  Right Ankle Arthroscopic Debridement  SURGEON:  Newt Minion, MD  PHYSICIAN ASSISTANT:None ANESTHESIA:   General  PREOPERATIVE INDICATIONS:  Brandi Bryant is a  61 y.o. female with a diagnosis of Impingement Right Ankle who failed conservative measures and elected for surgical management.    The risks benefits and alternatives were discussed with the patient preoperatively including but not limited to the risks of infection, bleeding, nerve injury, cardiopulmonary complications, the need for revision surgery, among others, and the patient was willing to proceed.  OPERATIVE IMPLANTS: None   OPERATIVE FINDINGS: synovitis with impingement no osteochondral defect  OPERATIVE PROCEDURE: Patient brought to the operating room underwent a general anesthetic. After adequate levels anesthesia were obtained patient's right lower extremity was prepped using DuraPrep draped into a sterile field a timeout was called. The scope was inserted through the anterior medial portal anterior lateral portal was established. The skin was incised sharply blunt dissection was carried down the capsule and a blunt trocar was used to insert into the capsule. Visualization showed significant amount of anterior impingement in both medial and lateral gutters as well as anteriorly with synovitis. With a shaver and the wand the synovectomy was debrided from anteriorly as well as mediolateral gutters. After debridement the articular cartilage was evaluated and was no osteochondral defect. Electrocautery was used for hemostasis. The portals were closed using 3-0 nylon a sterile compressive dressing was applied patient was extubated taken to the PACU in stable condition.

## 2016-12-15 ENCOUNTER — Encounter (HOSPITAL_COMMUNITY): Payer: Self-pay | Admitting: Orthopedic Surgery

## 2016-12-27 ENCOUNTER — Telehealth (INDEPENDENT_AMBULATORY_CARE_PROVIDER_SITE_OTHER): Payer: Self-pay | Admitting: Orthopedic Surgery

## 2016-12-27 NOTE — Telephone Encounter (Signed)
Called patient left message on home phone to return call to reschedule appointment for a  morning appointment with Junie Panning . [phone]

## 2016-12-28 ENCOUNTER — Inpatient Hospital Stay (INDEPENDENT_AMBULATORY_CARE_PROVIDER_SITE_OTHER): Payer: No Typology Code available for payment source | Admitting: Orthopedic Surgery

## 2016-12-29 ENCOUNTER — Encounter (INDEPENDENT_AMBULATORY_CARE_PROVIDER_SITE_OTHER): Payer: Self-pay | Admitting: Orthopedic Surgery

## 2016-12-29 ENCOUNTER — Ambulatory Visit (INDEPENDENT_AMBULATORY_CARE_PROVIDER_SITE_OTHER): Payer: Self-pay | Admitting: Orthopedic Surgery

## 2016-12-29 ENCOUNTER — Encounter (INDEPENDENT_AMBULATORY_CARE_PROVIDER_SITE_OTHER): Payer: Self-pay

## 2016-12-29 VITALS — Ht 59.5 in | Wt 149.0 lb

## 2016-12-29 DIAGNOSIS — M25871 Other specified joint disorders, right ankle and foot: Secondary | ICD-10-CM

## 2016-12-29 DIAGNOSIS — M7751 Other enthesopathy of right foot: Secondary | ICD-10-CM

## 2016-12-29 NOTE — Progress Notes (Signed)
Office Visit Note   Patient: Brandi Bryant           Date of Birth: 12/23/1955           MRN: [id number] Visit Date: 12/29/2016              Requested by: Arnoldo Morale, MD [place], [place] [address] PCP: Arnoldo Morale, MD  Chief Complaint  Patient presents with  . Right Ankle - Routine Post Op    12/14/16 Right Ankle Arthroscopic Debridement ~2 weeks post op    HPI: Patient is a 61 y.o female presenting for 2 week follow up right ankle arthroscopy. She is doing well, she has some pain especially when weightbearing. She is ambulating with crutches today. There is mild bruising and minimal swelling. Portals are clean and dry. There is no redness. Maxcine Ham, RT    Assessment & Plan: Visit Diagnoses:  1. Impingement syndrome of right ankle     Plan: Sutures removed patient given instructions to an interpreter and her family that she needs to work on range of motion the ankle weightbearing as tolerated advanced to regular shoewear.  Follow-Up Instructions: Return in about 4 weeks (around 01/26/2017).   Ortho Exam Examination patient's ankle is healing quite nicely there is no redness no swelling no cellulitis she does have pain with maximum dorsiflexion of the ankle. She has not been weightbearing on her ankle. Examination the left ankle patient had dropped some wood on her foot and there is some swelling around the ankle but there is no tenderness over the medial malleolus no clinical signs of infection no ecchymosis or bruising.  Imaging: No results found.  Orders:  No orders of the defined types were placed in this encounter.  No orders of the defined types were placed in this encounter.    Procedures: No procedures performed  Clinical Data: No additional findings.  Subjective: Review of Systems  Objective: Vital Signs: Ht 4' 11.5" (1.511 m)   Wt 149 lb (67.6 kg)   BMI 29.59 kg/m   Specialty Comments:  No specialty comments  available.  PMFS History: Patient Active Problem List   Diagnosis Date Noted  . Diabetic polyneuropathy associated with type 2 diabetes mellitus ([place]) 10/31/2016  . Impingement syndrome of right ankle 10/31/2016  . Symptomatic cholelithiasis 12/09/2015  . Encounter for screening colonoscopy 10/30/2015  . Colon cancer screening 10/27/2015  . Bronchitis, acute 09/08/2014  . Palpitations 08/28/2014  . Preventive measure 08/21/2014  . Intractable headache 02/21/2014  . TIA (transient ischemic attack) 02/20/2014  . Trigeminal neuralgia 01/22/2014  . Weakness 01/22/2014  . Bell's palsy 01/21/2014  . UTI (urinary tract infection) 01/21/2014  . Hepatic steatosis 01/21/2014  . Headache 01/20/2014  . Diabetes type 2, uncontrolled ([place]) 01/20/2014  . Facial droop 01/15/2014  . Stroke ([place]) 01/15/2014  . Type 2 diabetes mellitus with diabetic neuropathy ([place]) 12/06/2013  . Hyperlipidemia 11/07/2013  . CAD (coronary artery disease), native coronary artery 11/07/2013  . Chest pain 09/30/2013  . Arthralgia 08/13/2013  . History of breast cancer 06/01/2012  . Abdominal pain 01/31/2012  . Nausea & vomiting 01/31/2012  . HTN (hypertension)   . Diabetes mellitus ([place])    Past Medical History:  Diagnosis Date  . Abdominal pain   . Arthralgia 08/13/2013  . Bell palsy 2006  . Bell's palsy   . Breast cancer ([place])    Breast - surgery, chemo and radiation  . Chest pain 09/30/2013  . Cholelithiasis   .  Colon cancer screening 10/27/2015  . Complication of anesthesia    " tubal ligation, in [place]"  had weakness, couldnt stand up the next day, was given pills for oxygen for Brain."  1 month after I was having loss of attentiviness, still have to focus  carefully.   . Coronary artery disease   . Depression   . Diabetes mellitus    Type II  . Dyspnea    at times- when air conditioner is runnimg, heat also   . Encounter for screening colonoscopy 10/30/2015  . Hepatic steatosis   . History of  breast cancer 06/01/2012  . HTN (hypertension)     Family History  Problem Relation Age of Onset  . Thyroid cancer Sister   . Hypertension Mother   . Diabetes Mother   . Migraines Daughter     Past Surgical History:  Procedure Laterality Date  . ANKLE ARTHROSCOPY Right 12/14/2016   Procedure: Right Ankle Arthroscopic Debridement;  Surgeon: Newt Minion, MD;  Location: [facility];  Service: Orthopedics;  Laterality: Right;  . Arm surgery     Left tendon lengthen  . BREAST LUMPECTOMY Left 2010  . CHOLECYSTECTOMY N/A 12/09/2015   Procedure: LAPAROSCOPIC CHOLECYSTECTOMY WITH INTRAOPERATIVE CHOLANGIOGRAM;  Surgeon: Greer Pickerel, MD;  Location: [facility];  Service: General;  Laterality: N/A;  . LEFT HEART CATHETERIZATION WITH CORONARY ANGIOGRAM N/A 10/30/2013   Procedure: LEFT HEART CATHETERIZATION WITH CORONARY ANGIOGRAM;  Surgeon: Josue Hector, MD;  Location: [facility] CATH LAB;  Service: Cardiovascular;  Laterality: N/A;  . porta cath Right   . Removal of Porta cath Right   . TUBAL LIGATION     Social History   Occupational History  . Not on file.   Social History Main Topics  . Smoking status: Never Smoker  . Smokeless tobacco: Never Used  . Alcohol use No  . Drug use: No  . Sexual activity: Not Currently    Birth control/ protection: Surgical

## 2016-12-29 NOTE — Progress Notes (Signed)
After several attempts to call patient with lab results writer sent patient the results with a letter.

## 2017-01-16 ENCOUNTER — Other Ambulatory Visit: Payer: Self-pay | Admitting: Family Medicine

## 2017-01-16 DIAGNOSIS — I10 Essential (primary) hypertension: Secondary | ICD-10-CM

## 2017-01-16 DIAGNOSIS — E119 Type 2 diabetes mellitus without complications: Secondary | ICD-10-CM

## 2017-01-16 MED FILL — glipiZIDE 10 MG TABS: 10 | 30 days supply | Qty: 60 | Fill #1

## 2017-01-16 MED FILL — ?CARVEDILOL 12.5 MG TABLET: 12.5 | 30 days supply | Qty: 60 | Fill #1

## 2017-01-16 MED FILL — ?PANTOPRAZOLE SOD DR 40MG: 40 MG | 30 days supply | Qty: 30 | Fill #1

## 2017-01-17 MED FILL — LOSARTAN POTASSIUM 100 MG T: 100 | 30 days supply | Qty: 30 | Fill #0

## 2017-01-17 MED FILL — metFORMIN HCL 1000 MG TABS: 1000 | 30 days supply | Qty: 60 | Fill #0

## 2017-01-25 NOTE — Progress Notes (Signed)
Office Visit Note   Patient: Brandi Bryant           Date of Birth: [birth year]           MRN: [id number] Visit Date: 01/26/2017              Requested by: Arnoldo Morale, MD [place], [place] [address] PCP: Arnoldo Morale, MD  Chief Complaint  Patient presents with  . Right Ankle - Follow-up    12/14/16 right ankle arthroscopic debridement    HPI: Patient is a 61 y.o female presenting for 4 week follow up right ankle arthroscopy. She is approximately 6 weeks post op. She complains of swelling and pain with tightness. She complains of burning pain in the evenings which is relieved with ace bandage. She is currently taking ibuprofen for her pain. Portals are clean and dry. There is no redness. Maxcine Ham, RT    Assessment & Plan: Visit Diagnoses:  1. Impingement syndrome of right ankle     Plan: Recommended new balance walking sneakers to support her foot recommended compression stockings for the swelling  Follow-Up Instructions: Return if symptoms worsen or fail to improve.   Ortho Exam On examination she has some venous stasis swelling she has pain across the midfoot she has a nonsupportive slip or that she is wearing. She does have some diabetic neuropathic pain. ROS: Review of systems negative Imaging: No results found.  Labs: Lab Results  Component Value Date   HGBA1C 8.2 12/06/2016   HGBA1C 7.5 11/04/2016   HGBA1C 7.2 05/04/2016   ESRSEDRATE 4 01/24/2014   ESRSEDRATE 30 (H) 01/12/2009   REPTSTATUS 01/23/2014 FINAL 01/21/2014   CULT  01/21/2014    ESCHERICHIA COLI Performed at [facility] 01/21/2014    Orders:  No orders of the defined types were placed in this encounter.  No orders of the defined types were placed in this encounter.    Procedures: No procedures performed  Clinical Data: No additional findings.  Subjective: Review of Systems  Objective: Vital Signs: There were no  vitals taken for this visit.  Specialty Comments:  No specialty comments available.  PMFS History: Patient Active Problem List   Diagnosis Date Noted  . Diabetic polyneuropathy associated with type 2 diabetes mellitus ([place]) 10/31/2016  . Impingement syndrome of right ankle 10/31/2016  . Symptomatic cholelithiasis 12/09/2015  . Encounter for screening colonoscopy 10/30/2015  . Colon cancer screening 10/27/2015  . Bronchitis, acute 09/08/2014  . Palpitations 08/28/2014  . Preventive measure 08/21/2014  . Intractable headache 02/21/2014  . TIA (transient ischemic attack) 02/20/2014  . Trigeminal neuralgia 01/22/2014  . Weakness 01/22/2014  . Bell's palsy 01/21/2014  . UTI (urinary tract infection) 01/21/2014  . Hepatic steatosis 01/21/2014  . Headache 01/20/2014  . Diabetes type 2, uncontrolled ([place]) 01/20/2014  . Facial droop 01/15/2014  . Stroke ([place]) 01/15/2014  . Type 2 diabetes mellitus with diabetic neuropathy ([place]) 12/06/2013  . Hyperlipidemia 11/07/2013  . CAD (coronary artery disease), native coronary artery 11/07/2013  . Chest pain 09/30/2013  . Arthralgia 08/13/2013  . History of breast cancer 06/01/2012  . Abdominal pain 01/31/2012  . Nausea & vomiting 01/31/2012  . HTN (hypertension)   . Diabetes mellitus ([place])    Past Medical History:  Diagnosis Date  . Abdominal pain   . Arthralgia 08/13/2013  . Bell palsy 2006  . Bell's palsy   . Breast cancer ([place])    Breast - surgery,  chemo and radiation  . Chest pain 09/30/2013  . Cholelithiasis   . Colon cancer screening 10/27/2015  . Complication of anesthesia    " tubal ligation, in [place]"  had weakness, couldnt stand up the next day, was given pills for oxygen for Brain."  1 month after I was having loss of attentiviness, still have to focus  carefully.   . Coronary artery disease   . Depression   . Diabetes mellitus    Type II  . Dyspnea    at times- when air conditioner is runnimg, heat also   .  Encounter for screening colonoscopy 10/30/2015  . Hepatic steatosis   . History of breast cancer 06/01/2012  . HTN (hypertension)     Family History  Problem Relation Age of Onset  . Thyroid cancer Sister   . Hypertension Mother   . Diabetes Mother   . Migraines Daughter     Past Surgical History:  Procedure Laterality Date  . ANKLE ARTHROSCOPY Right 12/14/2016   Procedure: Right Ankle Arthroscopic Debridement;  Surgeon: Newt Minion, MD;  Location: [facility];  Service: Orthopedics;  Laterality: Right;  . Arm surgery     Left tendon lengthen  . BREAST LUMPECTOMY Left 2010  . CHOLECYSTECTOMY N/A 12/09/2015   Procedure: LAPAROSCOPIC CHOLECYSTECTOMY WITH INTRAOPERATIVE CHOLANGIOGRAM;  Surgeon: Greer Pickerel, MD;  Location: [facility];  Service: General;  Laterality: N/A;  . LEFT HEART CATHETERIZATION WITH CORONARY ANGIOGRAM N/A 10/30/2013   Procedure: LEFT HEART CATHETERIZATION WITH CORONARY ANGIOGRAM;  Surgeon: Josue Hector, MD;  Location: [facility] CATH LAB;  Service: Cardiovascular;  Laterality: N/A;  . porta cath Right   . Removal of Porta cath Right   . TUBAL LIGATION     Social History   Occupational History  . Not on file.   Social History Main Topics  . Smoking status: Never Smoker  . Smokeless tobacco: Never Used  . Alcohol use No  . Drug use: No  . Sexual activity: Not Currently    Birth control/ protection: Surgical

## 2017-01-26 ENCOUNTER — Encounter (INDEPENDENT_AMBULATORY_CARE_PROVIDER_SITE_OTHER): Payer: Self-pay | Admitting: Orthopedic Surgery

## 2017-01-26 ENCOUNTER — Ambulatory Visit (INDEPENDENT_AMBULATORY_CARE_PROVIDER_SITE_OTHER): Payer: Self-pay | Admitting: Orthopedic Surgery

## 2017-01-26 DIAGNOSIS — M25871 Other specified joint disorders, right ankle and foot: Secondary | ICD-10-CM

## 2017-01-26 DIAGNOSIS — M7751 Other enthesopathy of right foot: Secondary | ICD-10-CM

## 2017-02-01 ENCOUNTER — Encounter: Payer: Self-pay | Admitting: Family Medicine

## 2017-02-01 ENCOUNTER — Ambulatory Visit: Payer: Self-pay | Attending: Family Medicine | Admitting: Family Medicine

## 2017-02-01 VITALS — BP 143/81 | HR 74 | Temp 98.4°F | Ht 60.0 in | Wt 151.6 lb

## 2017-02-01 DIAGNOSIS — E1165 Type 2 diabetes mellitus with hyperglycemia: Secondary | ICD-10-CM

## 2017-02-01 DIAGNOSIS — R6889 Other general symptoms and signs: Secondary | ICD-10-CM

## 2017-02-01 DIAGNOSIS — Z794 Long term (current) use of insulin: Secondary | ICD-10-CM

## 2017-02-01 DIAGNOSIS — R6883 Chills (without fever): Secondary | ICD-10-CM | POA: Insufficient documentation

## 2017-02-01 DIAGNOSIS — IMO0001 Reserved for inherently not codable concepts without codable children: Secondary | ICD-10-CM

## 2017-02-01 DIAGNOSIS — I10 Essential (primary) hypertension: Secondary | ICD-10-CM

## 2017-02-01 DIAGNOSIS — M25471 Effusion, right ankle: Secondary | ICD-10-CM

## 2017-02-01 LAB — GLUCOSE, POCT (MANUAL RESULT ENTRY): POC Glucose: 168 mg/dl — AB (ref 70–99)

## 2017-02-01 LAB — TSH: TSH: 1.79 m[IU]/L

## 2017-02-01 MED ORDER — INSULIN PEN NEEDLE 31G X 8 MM MISC: 11 refills | Status: DC

## 2017-02-01 MED ORDER — LOSARTAN POTASSIUM 100 MG PO TABS: 100.0000 mg | ORAL_TABLET | Freq: Every day | ORAL | 3 refills | Status: DC

## 2017-02-01 MED ORDER — INSULIN DETEMIR 100 UNIT/ML FLEXPEN: 15.0000 [IU] | PEN_INJECTOR | Freq: Two times a day (BID) | SUBCUTANEOUS | 3 refills | Status: DC

## 2017-02-01 MED FILL — TRUEPLUS PEN NDL 31G X 1/4: 31G X 6 MM | 25 days supply | Qty: 100 | Fill #0

## 2017-02-01 MED FILL — TRUEPLUS PEN NDL 31G X 1/4": 31G X 6 MM | 25 days supply | Qty: 100 | Fill #0

## 2017-02-01 NOTE — Progress Notes (Signed)
Not taking lantus every night- confused about diabetes meds

## 2017-02-01 NOTE — Progress Notes (Signed)
Subjective:  Patient ID: Brandi Bryant, female    DOB: [birth year]  Age: 61 y.o. MRN: [id number]  CC: Diabetes and Chills   HPI Kyonna Frier is a 61 year old female with a history of hypertension, hyperlipidemia, type 2 diabetes mellitus (A1c 8.2 from 11/2016) who comes in for follow-up visit.  Metformin was discontinued at her last visit due to complaints of diarrhea and glipizide was initiated. She states she had to take metformin in addition to her glipizide and Lantus 15 units due to persistently elevated sugars. She does not have her blood sugar log with but states sugars went as high as 400s. If she had her way she would not like to take metformin.  Her blood pressure is slightly elevated and she endorses compliance with her antihypertensives. She complains of cold intolerance and occasional chills but no fever or upper respiratory symptoms.  , Past Medical History:  Diagnosis Date  . Abdominal pain   . Arthralgia 08/13/2013  . Bell palsy 2006  . Bell's palsy   . Breast cancer ([place])    Breast - surgery, chemo and radiation  . Chest pain 09/30/2013  . Cholelithiasis   . Colon cancer screening 10/27/2015  . Complication of anesthesia    " tubal ligation, in [place]"  had weakness, couldnt stand up the next day, was given pills for oxygen for Brain."  1 month after I was having loss of attentiviness, still have to focus  carefully.   . Coronary artery disease   . Depression   . Diabetes mellitus    Type II  . Dyspnea    at times- when air conditioner is runnimg, heat also   . Encounter for screening colonoscopy 10/30/2015  . Hepatic steatosis   . History of breast cancer 06/01/2012  . HTN (hypertension)     Past Surgical History:  Procedure Laterality Date  . ANKLE ARTHROSCOPY Right 12/14/2016   Procedure: Right Ankle Arthroscopic Debridement;  Surgeon: Newt Minion, MD;  Location: [place];  Service: Orthopedics;  Laterality: Right;  . Arm surgery     Left tendon lengthen  . BREAST LUMPECTOMY Left 2010  . CHOLECYSTECTOMY N/A 12/09/2015   Procedure: LAPAROSCOPIC CHOLECYSTECTOMY WITH INTRAOPERATIVE CHOLANGIOGRAM;  Surgeon: Greer Pickerel, MD;  Location: [place];  Service: General;  Laterality: N/A;  . LEFT HEART CATHETERIZATION WITH CORONARY ANGIOGRAM N/A 10/30/2013   Procedure: LEFT HEART CATHETERIZATION WITH CORONARY ANGIOGRAM;  Surgeon: Josue Hector, MD;  Location: [facility] CATH LAB;  Service: Cardiovascular;  Laterality: N/A;  . porta cath Right   . Removal of Porta cath Right   . TUBAL LIGATION      Allergies  Allergen Reactions  . Gadolinium Derivatives Nausea And Vomiting    Code: VOM, Desc: Pt began vomiting 45 sec after MRI contrast injection of Multihance, Onset Date: [id number]       Outpatient Medications Prior to Visit  Medication Sig Dispense Refill  . albuterol (PROVENTIL HFA;VENTOLIN HFA) 108 (90 Base) MCG/ACT inhaler Inhale 1-2 puffs into the lungs every 6 (six) hours as needed for wheezing or shortness of breath. 1 Inhaler 1  . aspirin 81 MG tablet Take 81 mg by mouth daily.    Marland Kitchen atorvastatin (LIPITOR) 40 MG tablet Take 1 tablet (40 mg total) by mouth daily. Needs office visit for refills 30 tablet 3  . benzonatate (TESSALON) 100 MG capsule Take 1 capsule (100 mg total) by mouth every 8 (eight) hours. 21 capsule 0  . carvedilol (COREG) 12.5 MG tablet TAKE  1 TABLET BY MOUTH 2 TIMES DAILY WITH A MEAL. 60 tablet 3  . cetirizine (ZYRTEC) 10 MG tablet Take 1 tablet (10 mg total) by mouth daily. 30 tablet 1  . gabapentin (NEURONTIN) 100 MG capsule Take 1 capsule (100 mg total) by mouth 3 (three) times daily. 90 capsule 0  . glipiZIDE (GLUCOTROL) 10 MG tablet Take 1 tablet (10 mg total) by mouth 2 (two) times daily before a meal. 60 tablet 3  . ibuprofen (ADVIL,MOTRIN) 200 MG tablet Take 400-600 mg by mouth every 6 (six) hours as needed for mild pain.    . Insulin Glargine (LANTUS SOLOSTAR) 100 UNIT/ML Solostar Pen Inject 15 Units into  the skin daily at 10 pm. 5 pen 3  . meclizine (ANTIVERT) 25 MG tablet Take 1 tablet (25 mg total) by mouth 3 (three) times daily as needed for dizziness. 30 tablet 0  . pantoprazole (PROTONIX) 40 MG tablet Take 1 tablet (40 mg total) by mouth daily. 30 tablet 3  . traZODone (DESYREL) 50 MG tablet Take 1 tablet (50 mg total) by mouth at bedtime as needed for sleep. 30 tablet 0  . Insulin Pen Needle (B-D ULTRAFINE III SHORT PEN) 31G X 8 MM MISC Use for nightly insulin injections 100 each 1  . losartan (COZAAR) 100 MG tablet Take 1 tablet (100 mg total) by mouth daily. 30 tablet 4  . metFORMIN (GLUCOPHAGE) 1000 MG tablet Take 1,000 mg by mouth 2 (two) times daily.  0  . calcium carbonate (TUMS - DOSED IN MG ELEMENTAL CALCIUM) 500 MG chewable tablet Chew 1 tablet by mouth daily. Reported on 05/19/2016    . HYDROcodone-acetaminophen (NORCO) 5-325 MG tablet Take 1 tablet by mouth every 6 (six) hours as needed. (Patient not taking: Reported on 02/01/2017) 30 tablet 0  . lactulose (CHRONULAC) 10 GM/15ML solution Take 15 mLs (10 g total) by mouth 3 (three) times daily. (Patient not taking: Reported on 02/01/2017) 946 mL 3  . PRESCRIPTION MEDICATION Take 0.5 tablets by mouth daily. Reported on 05/19/2016    . losartan (COZAAR) 100 MG tablet TAKE 1 TABLET BY MOUTH DAILY. 30 tablet 3  . metFORMIN (GLUCOPHAGE) 1000 MG tablet TAKE 1 TABLET BY MOUTH 2 TIMES DAILY WITH A MEAL. 60 tablet 3  . methylPREDNISolone (MEDROL DOSEPAK) 4 MG TBPK tablet Take 6-5-4-3-2-1 po qd 21 tablet 0   Facility-Administered Medications Prior to Visit  Medication Dose Route Frequency Provider Last Rate Last Dose  . aspirin tablet 325 mg  325 mg Oral Daily Lance Bosch, NP   325 mg at 09/02/15 1628    ROS Review of Systems  Constitutional: Positive for chills. Negative for activity change, appetite change and fatigue.  HENT: Negative for congestion, sinus pressure and sore throat.   Eyes: Negative for visual disturbance.    Respiratory: Negative for cough, chest tightness, shortness of breath and wheezing.   Cardiovascular: Positive for leg swelling (Right ankle). Negative for chest pain and palpitations.  Gastrointestinal: Negative for abdominal distention, abdominal pain and constipation.  Endocrine: Negative for polydipsia.  Genitourinary: Negative for dysuria and frequency.  Musculoskeletal: Negative for arthralgias and back pain.  Skin: Negative for rash.  Neurological: Negative for tremors, light-headedness and numbness.  Hematological: Does not bruise/bleed easily.  Psychiatric/Behavioral: Negative for agitation and behavioral problems.    Objective:  BP (!) 143/81 (BP Location: Right Arm, Patient Position: Sitting, Cuff Size: Small)   Pulse 74   Temp 98.4 F (36.9 C) (Oral)   Ht 5' (  1.524 m)   Wt 151 lb 9.6 oz (68.8 kg)   SpO2 96%   BMI 29.61 kg/m   BP/Weight 02/01/2017 12/29/2016 3/84/5364  Systolic BP 680 - 321  Diastolic BP 81 - 69  Wt. (Lbs) 151.6 149 -  BMI 29.61 29.59 -      Physical Exam  Constitutional: She is oriented to person, place, and time. She appears well-developed and well-nourished.  Cardiovascular: Normal rate, normal heart sounds and intact distal pulses.   No murmur heard. Pulmonary/Chest: Effort normal and breath sounds normal. She has no wheezes. She has no rales. She exhibits no tenderness.  Abdominal: Soft. Bowel sounds are normal. She exhibits no distension and no mass. There is no tenderness.  Musculoskeletal: Normal range of motion. She exhibits edema (1+ right ankle edema).  Neurological: She is alert and oriented to person, place, and time.     Assessment & Plan:   1. Essential hypertension Slightly above goal of less than 1:30/80 Low-sodium, DASH diet Lifestyle modification including exercise - losartan (COZAAR) 100 MG tablet; Take 1 tablet (100 mg total) by mouth daily.  Dispense: 30 tablet; Refill: 3  2. Uncontrolled type 2 diabetes mellitus  without complication, with long-term current use of insulin ([place]) Noted goal with A1c of 8.2 which is greater than 7.0 Previously complained of metformin intolerance due to GI side effects Continue glipizide Switch from Lantus to Levemir Instructed to self adjust Levemir by 2 units every fourth day until goal blood sugar is obtained; she also knows to reduce by 2 units in the event of hypoglycemia Keep blood sugar logs with fasting goals of 80-120 mg/dl, random of less than 180 and in the event of sugars less than 60 mg/dl or greater than 400 mg/dl please notify the clinic ASAP. It is recommended that you undergo annual eye exams and annual foot exams. Pneumovax is recommended every 5 years before the age of 48 and once for a lifetime at or after the age of 48. - Insulin Detemir (LEVEMIR FLEXTOUCH) 100 UNIT/ML Pen; Inject 15 Units into the skin 2 (two) times daily.  Dispense: 15 mL; Refill: 3 - Insulin Pen Needle (B-D ULTRAFINE III SHORT PEN) 31G X 8 MM MISC; Use for nightly insulin injections  Dispense: 100 each; Refill: 11  3. Cold intolerance - TSH  4. Ankle edema Right ankle edema at site of previous ankle surgery Advised to use compression sock, elevate feet, low-sodium diet  Meds ordered this encounter  Medications  . losartan (COZAAR) 100 MG tablet    Sig: Take 1 tablet (100 mg total) by mouth daily.    Dispense:  30 tablet    Refill:  3  . Insulin Detemir (LEVEMIR FLEXTOUCH) 100 UNIT/ML Pen    Sig: Inject 15 Units into the skin 2 (two) times daily.    Dispense:  15 mL    Refill:  3    Discontinue Lantus  . Insulin Pen Needle (B-D ULTRAFINE III SHORT PEN) 31G X 8 MM MISC    Sig: Use for nightly insulin injections    Dispense:  100 each    Refill:  11    Follow-up: Return in about 1 month (around 03/04/2017) for follow up on Diabetes.   Arnoldo Morale MD

## 2017-02-06 ENCOUNTER — Encounter: Payer: Self-pay | Admitting: Cardiology

## 2017-02-06 ENCOUNTER — Ambulatory Visit (INDEPENDENT_AMBULATORY_CARE_PROVIDER_SITE_OTHER): Payer: Self-pay | Admitting: Cardiology

## 2017-02-06 VITALS — BP 126/72 | HR 65 | Ht 60.0 in | Wt 148.0 lb

## 2017-02-06 DIAGNOSIS — M940 Chondrocostal junction syndrome [Tietze]: Secondary | ICD-10-CM

## 2017-02-06 DIAGNOSIS — E785 Hyperlipidemia, unspecified: Secondary | ICD-10-CM

## 2017-02-06 DIAGNOSIS — I25119 Atherosclerotic heart disease of native coronary artery with unspecified angina pectoris: Secondary | ICD-10-CM

## 2017-02-06 DIAGNOSIS — I10 Essential (primary) hypertension: Secondary | ICD-10-CM

## 2017-02-06 MED ORDER — ATORVASTATIN CALCIUM 40 MG PO TABS: 40.0000 mg | ORAL_TABLET | Freq: Every day | ORAL | 1 refills | Status: DC

## 2017-02-06 MED ORDER — LOSARTAN POTASSIUM 100 MG PO TABS: 100.0000 mg | ORAL_TABLET | Freq: Every day | ORAL | 1 refills | Status: DC

## 2017-02-06 MED ORDER — CARVEDILOL 12.5 MG PO TABS: ORAL_TABLET | ORAL | 1 refills | Status: DC

## 2017-02-06 NOTE — Progress Notes (Signed)
Cardiology Office Note    Date:  02/06/2017   ID:  Brandi Bryant, DOB 09-13-1956, MRN [id number]  PCP:  Arnoldo Morale, MD  Cardiologist:  Ena Dawley, MD   Chief complain: chest pain  History of Present Illness:  Brandi Bryant is a 61 y.o. 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 She was on adjuvant hormonal therapy with tamoxifen started in August 2010. In March 2014: her treatment is switched to Femara. Her most recent CAT scan showed no evidence of metastatic disease. The same CT chest showed calcifications of her coronary vessels including left main and LAD.  The patient has been experiencing exertional dyspnea after walking a flight of stairs, that eventually progresses to chest pain on almost any exertion. She underwent a cardiac cath in Dec 2014 that showed mod non-obstructive CAD in the LAD/D1, D2, normal LVEF 60-65%. She was started on low dose atorvastatin considering her fatty lipids  11/02/16 - 6 months follow up, the patient has been in her PCP office 2 weeks ago with complains of cough, chills and obtained antibiotics. She has also experience chest pain while taking a deep breath, coughing, but also walking and associated with DOE. Denies dizziness, palpitations or syncope. She has episodes of SOB at rest.   02/06/2017, the patient is coming after 3 months, at the last visit chief complain of atypical chest pain with some typical features and underwent exercise nuclear stress test that showed no prior scar and no ischemia, normal LVEF, it also showed hypertensive response to stress. Been asked she said she didn't take her blood pressure medications the day of her stress test. She continues to have chest pains  when changing positions in bed the ER around her sternum, she denies any heavy lifting or work. She denies any dizziness dyspnea on exertion, she is tolerating Lipitor well. She has been getting new prescription for GERD that is working.  Past Medical History:  Diagnosis Date  . Abdominal pain   . Arthralgia 08/13/2013  . Bell palsy 2006  . Bell's palsy   . Breast cancer ([place])    Breast - surgery, chemo and radiation  . Chest pain 09/30/2013  . Cholelithiasis   . Colon cancer screening 10/27/2015  . Complication of anesthesia    " tubal ligation, in [place]"  had weakness, couldnt stand up the next day, was given pills for oxygen for Brain."  1 month after I was having loss of attentiviness, still have to focus  carefully.   . Coronary artery disease   . Depression   . Diabetes mellitus    Type II  . Dyspnea    at times- when air conditioner is runnimg, heat also   . Encounter for screening colonoscopy 10/30/2015  . Hepatic steatosis   . History of breast cancer 06/01/2012  . HTN (hypertension)     Past Surgical History:  Procedure Laterality Date  . ANKLE ARTHROSCOPY Right 12/14/2016   Procedure: Right Ankle Arthroscopic Debridement;  Surgeon: Newt Minion, MD;  Location: [place];  Service: Orthopedics;  Laterality: Right;  . Arm surgery     Left tendon lengthen  . BREAST LUMPECTOMY Left 2010  . CHOLECYSTECTOMY N/A 12/09/2015   Procedure: LAPAROSCOPIC CHOLECYSTECTOMY WITH INTRAOPERATIVE CHOLANGIOGRAM;  Surgeon: Greer Pickerel, MD;  Location: [place];  Service: General;  Laterality: N/A;  . LEFT HEART CATHETERIZATION WITH CORONARY ANGIOGRAM N/A 10/30/2013   Procedure: LEFT HEART CATHETERIZATION WITH CORONARY ANGIOGRAM;  Surgeon: Josue Hector, MD;  Location[facility] CATH LAB;  Service: Cardiovascular;  Laterality: N/A;  . porta cath Right   . Removal of Porta cath Right   . TUBAL LIGATION      Current Medications: Outpatient Medications Prior to Visit  Medication Sig Dispense Refill  .  aspirin 81 MG tablet Take 81 mg by mouth daily.    Marland Kitchen gabapentin (NEURONTIN) 100 MG capsule Take 1 capsule (100 mg total) by mouth 3 (three) times daily. 90 capsule 0  . glipiZIDE (GLUCOTROL) 10 MG tablet Take 1 tablet (10 mg total) by mouth 2 (two) times daily before a meal. 60 tablet 3  . lactulose (CHRONULAC) 10 GM/15ML solution Take 15 mLs (10 g total) by mouth 3 (three) times daily. 946 mL 3  . traZODone (DESYREL) 50 MG tablet Take 1 tablet (50 mg total) by mouth at bedtime as needed for sleep. 30 tablet 0  . atorvastatin (LIPITOR) 40 MG tablet Take 1 tablet (40 mg total) by mouth daily. Needs office visit for refills 30 tablet 3  . carvedilol (COREG) 12.5 MG tablet TAKE 1 TABLET BY MOUTH 2 TIMES DAILY WITH A MEAL. 60 tablet 3  . losartan (COZAAR) 100 MG tablet Take 1 tablet (100 mg total) by mouth daily. 30 tablet 3  . albuterol (PROVENTIL HFA;VENTOLIN HFA) 108 (90 Base) MCG/ACT inhaler Inhale 1-2 puffs into the lungs every 6 (six) hours as needed for wheezing or shortness of breath. (Patient not taking: Reported on 02/06/2017) 1 Inhaler 1  . calcium carbonate (TUMS - DOSED IN MG ELEMENTAL CALCIUM) 500 MG chewable tablet Chew 1 tablet by mouth daily. Reported on 05/19/2016    . cetirizine (ZYRTEC) 10 MG tablet Take 1 tablet (10 mg total) by mouth daily. (Patient not taking: Reported on 02/06/2017) 30 tablet 1  . ibuprofen (ADVIL,MOTRIN) 200 MG tablet Take 400-600 mg by mouth every 6 (six) hours as needed for mild pain.    . Insulin Detemir (LEVEMIR FLEXTOUCH) 100 UNIT/ML Pen Inject 15 Units into the skin 2 (two) times daily. (Patient not taking: Reported on 02/06/2017) 15 mL 3  . Insulin Glargine (LANTUS SOLOSTAR) 100 UNIT/ML Solostar Pen Inject 15 Units into the skin daily at 10 pm. (Patient not taking: Reported on 02/06/2017) 5 pen 3  . Insulin Pen Needle (B-D ULTRAFINE III SHORT PEN) 31G X 8 MM MISC Use for nightly insulin injections (Patient not taking: Reported on 02/06/2017) 100 each 11  .  pantoprazole (PROTONIX) 40 MG tablet Take 1 tablet (40 mg total) by mouth daily. (Patient not taking: Reported on 02/06/2017) 30 tablet 3  . benzonatate (TESSALON) 100 MG capsule Take 1 capsule (100 mg total) by mouth every 8 (eight) hours. (Patient not taking: Reported on 02/06/2017) 21 capsule 0  . HYDROcodone-acetaminophen (NORCO) 5-325 MG tablet Take 1 tablet by mouth every 6 (six) hours as needed. (Patient not taking: Reported on 02/01/2017) 30 tablet 0  . meclizine (ANTIVERT) 25 MG tablet Take 1 tablet (25 mg total) by mouth 3 (three) times daily as needed for dizziness. (Patient not taking: Reported on 02/06/2017) 30 tablet 0  . PRESCRIPTION MEDICATION Take 0.5 tablets by mouth daily. Reported on 05/19/2016     Facility-Administered Medications Prior to Visit  Medication Dose Route Frequency Provider Last Rate Last Dose  . aspirin tablet 325 mg  325 mg  Oral Daily Lance Bosch, NP   325 mg at 09/02/15 1628     Allergies:   Gadolinium derivatives   Social History   Social History  . Marital status: Single    Spouse name: N/A  . Number of children: N/A  . Years of education: N/A   Social History Main Topics  . Smoking status: Never Smoker  . Smokeless tobacco: Never Used  . Alcohol use No  . Drug use: No  . Sexual activity: Not Currently    Birth control/ protection: Surgical   Other Topics Concern  . None   Social History Narrative  . None     Family History:  The patient's family history includes Diabetes in her mother; Hypertension in her mother; Migraines in her daughter; Thyroid cancer in her sister.   ROS:   Please see the history of present illness.    ROS All other systems reviewed and are negative.   PHYSICAL EXAM:   VS:  BP 126/72   Pulse 65   Ht 5' (1.524 m)   Wt 148 lb (67.1 kg)   SpO2 95%   BMI 28.90 kg/m    GEN: Well nourished, well developed, in no acute distress  HEENT: normal  Neck: no JVD, carotid bruits, or masses Cardiac: RRR; no murmurs,  rubs, or gallops,no edema  Respiratory:  clear to auscultation bilaterally, normal work of breathing GI: soft, nontender, nondistended, + BS MS: no deformity or atrophy  Skin: warm and dry, no rash Neuro:  Alert and Oriented x 3, Strength and sensation are intact Psych: euthymic mood, full affect  Wt Readings from Last 3 Encounters:  02/06/17 148 lb (67.1 kg)  02/01/17 151 lb 9.6 oz (68.8 kg)  12/29/16 149 lb (67.6 kg)      Studies/Labs Reviewed:   EKG:  EKG is ordered today.  The ekg ordered today demonstrates SR, normal ECG  Recent Labs: 12/08/2016: Hemoglobin 14.4; Platelets 180 12/13/2016: ALT 35; BUN 14; Creat 0.68; Potassium 4.0; Sodium 140 02/01/2017: TSH 1.79   Lipid Panel    Component Value Date/Time   CHOL 120 12/13/2016 0841   TRIG 149 12/13/2016 0841   HDL 37 (L) 12/13/2016 0841   CHOLHDL 3.2 12/13/2016 0841   VLDL 26 04/18/2014 0855   LDLCALC 82 04/18/2014 0855    Nuclear stress test: 11/11/2016  Nuclear stress EF: 67%.  Blood pressure demonstrated a normal response to exercise.  There was no ST segment deviation noted during stress.  The study is normal.  This is a low risk study.  The left ventricular ejection fraction is hyperdynamic (>65%).   ASSESSMENT:    1. Coronary artery disease involving native coronary artery of native heart with angina pectoris ([place])   2. Essential hypertension   3. Hyperlipidemia, unspecified hyperlipidemia type   4. Costochondritis, acute      PLAN:  In order of problems listed above:  1. Chest pain - some typical but mostly atypical features, given h/o a cardiac cath that showed moderate non-obstructive CAD we scheduled an exercise nuclear stress test that was negative for prior infarct or ischemia. Her symptoms most consistent with a costochondritis. She is advised to take ibuprofen as needed and avoid heavy lifting.  2. SOB post URI, she was prescribed Albuterol as needed. Her shortness of breath has  improved.  3. Palpitations - improved after starting carvedilol, 48 hour Holter monitor showed only few PVCs.  4. Hypertension - controlled, hypertensive response to exercise during stress test  however she hadn't taken her medication that day.  5. Hyperlipidemia - continue atorvastatin 10 mg po daily, she has no side effects lipids are at goal.  5. Diabetic neuropathy - improved with Lyrica 50 mg twice a day  Follow up in 3 months.  Medication Adjustments/Labs and Tests Ordered: Current medicines are reviewed at length with the patient today.  Concerns regarding medicines are outlined above.  Medication changes, Labs and Tests ordered today are listed in the Patient Instructions below. Patient Instructions  Medication Instructions:  Your physician recommends that you continue on your current medications as directed. Please refer to the Current Medication list given to you today.   Labwork: None   Testing/Procedures: None   Follow-Up: Your physician wants you to follow-up in: 6 months with Dr Meda Coffee. (September 2018). You will receive a reminder letter in the mail two months in advance. If you don't receive a letter, please call our office to schedule the follow-up appointment.        If you need a refill on your cardiac medications before your next appointment, please call your pharmacy.      Signed, Ena Dawley, MD  02/06/2017 11:11 AM    [address], [place], [place]  [address] Phone: [phone]; Fax: [phone]

## 2017-02-06 NOTE — Patient Instructions (Signed)
Medication Instructions:  Your physician recommends that you continue on your current medications as directed. Please refer to the Current Medication list given to you today.   Labwork: None   Testing/Procedures: None   Follow-Up: Your physician wants you to follow-up in: 6 months with Dr Meda Coffee. (September 2018). You will receive a reminder letter in the mail two months in advance. If you don't receive a letter, please call our office to schedule the follow-up appointment.        If you need a refill on your cardiac medications before your next appointment, please call your pharmacy.

## 2017-02-13 ENCOUNTER — Other Ambulatory Visit: Payer: Self-pay | Admitting: Family Medicine

## 2017-02-13 DIAGNOSIS — K5909 Other constipation: Secondary | ICD-10-CM

## 2017-02-13 MED FILL — LOSARTAN POTASSIUM 100 MG T: 100 | 30 days supply | Qty: 30 | Fill #1

## 2017-02-13 MED FILL — glipiZIDE 10 MG TABS: 10 | 30 days supply | Qty: 60 | Fill #2

## 2017-02-13 MED FILL — GABAPENTIN 100 MG CAPSULE: 100 | 30 days supply | Qty: 90 | Fill #0

## 2017-02-13 MED FILL — LACTULOSE 10 GM/15 ML SOLN: 10 | 21 days supply | Qty: 946 | Fill #0

## 2017-02-13 MED FILL — CARVEDILOL 12.5 MG TABLET: 12.5 | 30 days supply | Qty: 60 | Fill #2

## 2017-02-13 MED FILL — ?METFORMIN HCL 1,000 MG TAB: 1000 | 30 days supply | Qty: 60 | Fill #1

## 2017-02-13 MED FILL — ?PANTOPRAZOLE SOD DR 40MG: 40 MG | 30 days supply | Qty: 30 | Fill #2

## 2017-02-22 ENCOUNTER — Telehealth: Payer: Self-pay

## 2017-02-22 NOTE — Telephone Encounter (Signed)
-----   Message from Arnoldo Morale, MD sent at 02/02/2017  1:36 PM EDT ----- Please inform the patient that labs are normal. Thank you.

## 2017-02-22 NOTE — Telephone Encounter (Signed)
Writer called patient through [employer] and was able to discuss lab results.  Patient stated understanding.

## 2017-03-06 ENCOUNTER — Ambulatory Visit: Payer: Self-pay | Admitting: Family Medicine

## 2017-03-23 MED FILL — LOSARTAN POTASSIUM 100 MG T: 100 | 30 days supply | Qty: 30 | Fill #2

## 2017-03-23 MED FILL — glipiZIDE 10 MG TABS: 10 | 30 days supply | Qty: 60 | Fill #3

## 2017-03-23 MED FILL — CARVEDILOL 12.5 MG TABLET: 12.5 | 30 days supply | Qty: 60 | Fill #3

## 2017-03-23 MED FILL — PANTOPRAZOLE SOD DR 40 MG T: 40 | 30 days supply | Qty: 30 | Fill #3

## 2017-03-27 MED FILL — !LEVEMIR FLEXPEN 100UNITS/M: 100U/ML (3) | 30 days supply | Qty: 9 | Fill #0

## 2017-03-27 MED FILL — TRUEPLUS PEN NDL 31G X 1/4: 31G X 6 MM | 25 days supply | Qty: 100 | Fill #1

## 2017-03-27 MED FILL — TRUEPLUS PEN NDL 31G X 1/4": 31G X 6 MM | 25 days supply | Qty: 100 | Fill #1

## 2017-04-10 ENCOUNTER — Ambulatory Visit: Payer: Self-pay

## 2017-04-10 ENCOUNTER — Other Ambulatory Visit: Payer: Self-pay | Admitting: Family Medicine

## 2017-04-10 DIAGNOSIS — R6889 Other general symptoms and signs: Secondary | ICD-10-CM

## 2017-04-10 DIAGNOSIS — Z794 Long term (current) use of insulin: Principal | ICD-10-CM

## 2017-04-10 DIAGNOSIS — I10 Essential (primary) hypertension: Secondary | ICD-10-CM

## 2017-04-10 DIAGNOSIS — IMO0001 Reserved for inherently not codable concepts without codable children: Secondary | ICD-10-CM

## 2017-04-10 DIAGNOSIS — E1165 Type 2 diabetes mellitus with hyperglycemia: Principal | ICD-10-CM

## 2017-04-10 MED ORDER — INSULIN DETEMIR 100 UNIT/ML FLEXPEN: 21.0000 [IU] | PEN_INJECTOR | Freq: Two times a day (BID) | SUBCUTANEOUS | 3 refills | Status: DC

## 2017-04-10 MED FILL — !LEVEMIR FLEXPEN 100UNITS/M: 100U/ML (3) | 20 days supply | Qty: 9 | Fill #0

## 2017-04-10 MED FILL — ?ATORVASTATIN 40MG TABLET: 40 | 30 days supply | Qty: 30 | Fill #1

## 2017-04-25 ENCOUNTER — Other Ambulatory Visit: Payer: Self-pay | Admitting: *Deleted

## 2017-04-25 DIAGNOSIS — R002 Palpitations: Secondary | ICD-10-CM

## 2017-04-25 DIAGNOSIS — R079 Chest pain, unspecified: Secondary | ICD-10-CM

## 2017-04-25 MED ORDER — ALBUTEROL SULFATE HFA 108 (90 BASE) MCG/ACT IN AERS: 1.0000 | INHALATION_SPRAY | Freq: Four times a day (QID) | RESPIRATORY_TRACT | 3 refills | Status: DC | PRN

## 2017-04-25 MED ORDER — INSULIN GLARGINE 100 UNIT/ML SOLOSTAR PEN: 15.0000 [IU] | PEN_INJECTOR | Freq: Two times a day (BID) | SUBCUTANEOUS | 3 refills | Status: DC

## 2017-04-25 NOTE — Telephone Encounter (Signed)
PRINTED FOR PASS PROGRAM 

## 2017-04-27 ENCOUNTER — Other Ambulatory Visit: Payer: Self-pay | Admitting: Family Medicine

## 2017-04-27 ENCOUNTER — Other Ambulatory Visit: Payer: Self-pay | Admitting: Physician Assistant

## 2017-04-27 DIAGNOSIS — I25119 Atherosclerotic heart disease of native coronary artery with unspecified angina pectoris: Secondary | ICD-10-CM

## 2017-04-27 DIAGNOSIS — I10 Essential (primary) hypertension: Secondary | ICD-10-CM

## 2017-04-27 MED FILL — ?GLIPIZIDE 10 MG TABLET: 10 | 30 days supply | Qty: 60 | Fill #0

## 2017-04-27 MED FILL — TRUEPLUS PEN NDL 31G X 1/4": 31G X 6 MM | 25 days supply | Qty: 100 | Fill #2

## 2017-04-27 MED FILL — !LEVEMIR FLEXPEN 100UNITS/M: 100U/ML (3) | 20 days supply | Qty: 9 | Fill #1

## 2017-04-27 MED FILL — TRUEPLUS PEN NDL 31G X 1/4: 31G X 6 MM | 25 days supply | Qty: 100 | Fill #2

## 2017-04-27 MED FILL — LOSARTAN POTASSIUM 100 MG T: 100 | 30 days supply | Qty: 30 | Fill #3

## 2017-04-27 MED FILL — ?PANTOPRAZOLE SOD DR 40MG: 40 MG | 30 days supply | Qty: 30 | Fill #0

## 2017-04-27 MED FILL — CARVEDILOL 12.5 MG TABLET: 12.5 | 30 days supply | Qty: 60 | Fill #0

## 2017-04-27 MED FILL — ?METFORMIN HCL 1,000 MG TAB: 1000 | 30 days supply | Qty: 60 | Fill #2

## 2017-05-16 ENCOUNTER — Encounter: Payer: Self-pay | Admitting: Family Medicine

## 2017-05-16 ENCOUNTER — Ambulatory Visit: Payer: Self-pay | Attending: Family Medicine | Admitting: Family Medicine

## 2017-05-16 VITALS — BP 165/78 | HR 69 | Temp 98.1°F | Wt 157.8 lb

## 2017-05-16 DIAGNOSIS — Z7982 Long term (current) use of aspirin: Secondary | ICD-10-CM | POA: Insufficient documentation

## 2017-05-16 DIAGNOSIS — R5383 Other fatigue: Secondary | ICD-10-CM | POA: Insufficient documentation

## 2017-05-16 DIAGNOSIS — J329 Chronic sinusitis, unspecified: Secondary | ICD-10-CM | POA: Insufficient documentation

## 2017-05-16 DIAGNOSIS — I10 Essential (primary) hypertension: Secondary | ICD-10-CM | POA: Insufficient documentation

## 2017-05-16 DIAGNOSIS — G4700 Insomnia, unspecified: Secondary | ICD-10-CM | POA: Insufficient documentation

## 2017-05-16 DIAGNOSIS — Z79899 Other long term (current) drug therapy: Secondary | ICD-10-CM | POA: Insufficient documentation

## 2017-05-16 DIAGNOSIS — E1140 Type 2 diabetes mellitus with diabetic neuropathy, unspecified: Secondary | ICD-10-CM

## 2017-05-16 DIAGNOSIS — J328 Other chronic sinusitis: Secondary | ICD-10-CM

## 2017-05-16 DIAGNOSIS — E785 Hyperlipidemia, unspecified: Secondary | ICD-10-CM | POA: Insufficient documentation

## 2017-05-16 DIAGNOSIS — R040 Epistaxis: Secondary | ICD-10-CM | POA: Insufficient documentation

## 2017-05-16 DIAGNOSIS — E119 Type 2 diabetes mellitus without complications: Secondary | ICD-10-CM | POA: Insufficient documentation

## 2017-05-16 DIAGNOSIS — Z794 Long term (current) use of insulin: Secondary | ICD-10-CM | POA: Insufficient documentation

## 2017-05-16 DIAGNOSIS — R51 Headache: Secondary | ICD-10-CM | POA: Insufficient documentation

## 2017-05-16 HISTORY — DX: Chronic sinusitis, unspecified: J32.9

## 2017-05-16 LAB — GLUCOSE, POCT (MANUAL RESULT ENTRY): POC GLUCOSE: 204 mg/dL — AB (ref 70–99)

## 2017-05-16 LAB — POCT GLYCOSYLATED HEMOGLOBIN (HGB A1C): HEMOGLOBIN A1C: 8.3

## 2017-05-16 MED ORDER — ATORVASTATIN CALCIUM 40 MG PO TABS: 40.0000 mg | ORAL_TABLET | Freq: Every day | ORAL | 6 refills | Status: DC

## 2017-05-16 MED ORDER — CETIRIZINE HCL 10 MG PO TABS: 10.0000 mg | ORAL_TABLET | Freq: Every day | ORAL | 1 refills | Status: DC

## 2017-05-16 MED ORDER — INSULIN DETEMIR 100 UNIT/ML FLEXPEN: 25.0000 [IU] | PEN_INJECTOR | Freq: Two times a day (BID) | SUBCUTANEOUS | 3 refills | Status: DC

## 2017-05-16 MED ORDER — GABAPENTIN 300 MG PO CAPS: 300.0000 mg | ORAL_CAPSULE | Freq: Two times a day (BID) | ORAL | 6 refills | Status: DC

## 2017-05-16 MED ORDER — AMLODIPINE BESYLATE 5 MG PO TABS: 5.0000 mg | ORAL_TABLET | Freq: Every day | ORAL | 6 refills | Status: DC

## 2017-05-16 MED ORDER — TRAZODONE HCL 50 MG PO TABS: 50.0000 mg | ORAL_TABLET | Freq: Every evening | ORAL | 6 refills | Status: DC | PRN

## 2017-05-16 MED ORDER — PANTOPRAZOLE SODIUM 40 MG PO TBEC: 40.0000 mg | DELAYED_RELEASE_TABLET | Freq: Every day | ORAL | 6 refills | Status: DC

## 2017-05-16 MED ORDER — GLIPIZIDE 10 MG PO TABS
ORAL_TABLET | ORAL | 6 refills | Status: DC
Start: 2017-05-16 — End: 2017-11-27

## 2017-05-16 MED ORDER — FLUTICASONE PROPIONATE 50 MCG/ACT NA SUSP: 2.0000 | Freq: Every day | NASAL | 6 refills | Status: DC

## 2017-05-16 MED ORDER — LOSARTAN POTASSIUM 100 MG PO TABS: 100.0000 mg | ORAL_TABLET | Freq: Every day | ORAL | 6 refills | Status: DC

## 2017-05-16 MED ORDER — CARVEDILOL 12.5 MG PO TABS: ORAL_TABLET | ORAL | 6 refills | Status: DC

## 2017-05-16 NOTE — Progress Notes (Signed)
Subjective:  Patient ID: Brandi Bryant, female    DOB: 06/06/1956  Age: 61 y.o. MRN: [id number]  CC: Diabetes   HPI Brandi Bryant is a 61 year old female with a history of hypertension, hyperlipidemia, type 2 diabetes mellitus (A1c 8.3) who comes in for follow-up visit  Her blood pressure is elevated and she endorses compliance with antihypertensives and low-sodium diet.  Her A1c is 8.3 and she has been taking her Levemir as prescribed but does not comply with a diabetic diet or exercise.  She complains of headaches which are worse at night for the last 1 month, cold most chills and intermittent epistaxis. She endorses postnasal drip but denies facial tenderness, fevers. Denies nausea, vomiting or blurry vision.  She also complains of fatigue which interferes with her ADLs. Denies shortness of breath, pedal edema or wheezing. TSH from 01/2017 was normal Also complains of insomnia.  Past Medical History:  Diagnosis Date  . Abdominal pain   . Arthralgia 08/13/2013  . Bell palsy 2006  . Bell's palsy   . Breast cancer ([place])    Breast - surgery, chemo and radiation  . Chest pain 09/30/2013  . Cholelithiasis   . Colon cancer screening 10/27/2015  . Complication of anesthesia    " tubal ligation, in [place]"  had weakness, couldnt stand up the next day, was given pills for oxygen for Brain."  1 month after I was having loss of attentiviness, still have to focus  carefully.   . Coronary artery disease   . Depression   . Diabetes mellitus    Type II  . Dyspnea    at times- when air conditioner is runnimg, heat also   . Encounter for screening colonoscopy 10/30/2015  . Hepatic steatosis   . History of breast cancer 06/01/2012  . HTN (hypertension)     Past Surgical History:  Procedure Laterality Date  . ANKLE ARTHROSCOPY Right 12/14/2016   Procedure: Right Ankle Arthroscopic Debridement;  Surgeon: Newt Minion, MD;  Location: [place];  Service: Orthopedics;   Laterality: Right;  . Arm surgery     Left tendon lengthen  . BREAST LUMPECTOMY Left 2010  . CHOLECYSTECTOMY N/A 12/09/2015   Procedure: LAPAROSCOPIC CHOLECYSTECTOMY WITH INTRAOPERATIVE CHOLANGIOGRAM;  Surgeon: Greer Pickerel, MD;  Location: [place];  Service: General;  Laterality: N/A;  . LEFT HEART CATHETERIZATION WITH CORONARY ANGIOGRAM N/A 10/30/2013   Procedure: LEFT HEART CATHETERIZATION WITH CORONARY ANGIOGRAM;  Surgeon: Josue Hector, MD;  Location: [facility] CATH LAB;  Service: Cardiovascular;  Laterality: N/A;  . porta cath Right   . Removal of Porta cath Right   . TUBAL LIGATION      Allergies  Allergen Reactions  . Gadolinium Derivatives Nausea And Vomiting    Code: VOM, Desc: Pt began vomiting 45 sec after MRI contrast injection of Multihance, Onset Date: [id number]       Outpatient Medications Prior to Visit  Medication Sig Dispense Refill  . albuterol (PROVENTIL HFA;VENTOLIN HFA) 108 (90 Base) MCG/ACT inhaler Inhale 1-2 puffs into the lungs every 6 (six) hours as needed for wheezing or shortness of breath. 54 g 3  . aspirin 81 MG tablet Take 81 mg by mouth daily.    . calcium carbonate (TUMS - DOSED IN MG ELEMENTAL CALCIUM) 500 MG chewable tablet Chew 1 tablet by mouth daily. Reported on 05/19/2016    . ibuprofen (ADVIL,MOTRIN) 200 MG tablet Take 400-600 mg by mouth every 6 (six) hours as needed for mild pain.    Marland Kitchen  Insulin Glargine (LANTUS) 100 UNIT/ML Solostar Pen Inject 15 Units into the skin 2 (two) times daily. 30 mL 3  . lactulose, encephalopathy, (CHRONULAC) 10 GM/15ML SOLN TAKE 15 MLS BY MOUTH 3 TIMES DAILY. 946 mL 0  . atorvastatin (LIPITOR) 40 MG tablet Take 1 tablet (40 mg total) by mouth daily. Needs office visit for refills 90 tablet 1  . carvedilol (COREG) 12.5 MG tablet TAKE 1 TABLET BY MOUTH 2 TIMES DAILY WITH A MEAL. 60 tablet 0  . gabapentin (NEURONTIN) 100 MG capsule Take 1 capsule (100 mg total) by mouth 3 (three) times daily. 90 capsule 0  . gabapentin (NEURONTIN)  100 MG capsule TAKE 1 CAPSULE BY MOUTH 3 TIMES DAILY. 90 capsule 2  . glipiZIDE (GLUCOTROL) 10 MG tablet TAKE 1 TABLET BY MOUTH 2 TIMES DAILY BEFORE A MEAL. 60 tablet 0  . Insulin Detemir (LEVEMIR FLEXTOUCH) 100 UNIT/ML Pen Inject 21 Units into the skin 2 (two) times daily. 15 mL 3  . losartan (COZAAR) 100 MG tablet Take 1 tablet (100 mg total) by mouth daily. 90 tablet 1  . pantoprazole (PROTONIX) 40 MG tablet TAKE 1 TABLET BY MOUTH DAILY. 30 tablet 0  . traZODone (DESYREL) 50 MG tablet Take 1 tablet (50 mg total) by mouth at bedtime as needed for sleep. 30 tablet 0  . Insulin Pen Needle (B-D ULTRAFINE III SHORT PEN) 31G X 8 MM MISC Use for nightly insulin injections (Patient not taking: Reported on 02/06/2017) 100 each 11  . cetirizine (ZYRTEC) 10 MG tablet Take 1 tablet (10 mg total) by mouth daily. (Patient not taking: Reported on 02/06/2017) 30 tablet 1   Facility-Administered Medications Prior to Visit  Medication Dose Route Frequency Provider Last Rate Last Dose  . aspirin tablet 325 mg  325 mg Oral Daily Chari Manning A, NP   325 mg at 09/02/15 1628    ROS Review of Systems  Constitutional: Positive for fatigue. Negative for activity change and appetite change.  HENT: Positive for nosebleeds. Negative for congestion, sinus pressure and sore throat.   Eyes: Negative for visual disturbance.  Respiratory: Negative for cough, chest tightness, shortness of breath and wheezing.   Cardiovascular: Negative for chest pain and palpitations.  Gastrointestinal: Negative for abdominal distention, abdominal pain and constipation.  Endocrine: Negative for polydipsia.  Genitourinary: Negative for dysuria and frequency.  Musculoskeletal: Negative for arthralgias and back pain.  Skin: Negative for rash.  Neurological: Positive for headaches. Negative for tremors, light-headedness and numbness.  Hematological: Does not bruise/bleed easily.  Psychiatric/Behavioral: Negative for agitation and behavioral  problems.    Objective:  BP (!) 165/78   Pulse 69   Temp 98.1 F (36.7 C) (Oral)   Wt 157 lb 12.8 oz (71.6 kg)   SpO2 97%   BMI 30.82 kg/m   BP/Weight 05/16/2017 02/06/2017 3/66/4403  Systolic BP 474 259 563  Diastolic BP 78 72 81  Wt. (Lbs) 157.8 148 151.6  BMI 30.82 28.9 29.61      Physical Exam  Constitutional: She is oriented to person, place, and time. She appears well-developed and well-nourished.  HENT:  Right Ear: External ear normal.  Left Ear: External ear normal.  Mouth/Throat: Oropharynx is clear and moist.  Neck: No JVD present.  Cardiovascular: Normal rate, normal heart sounds and intact distal pulses.   No murmur heard. Pulmonary/Chest: Effort normal and breath sounds normal. She has no wheezes. She has no rales. She exhibits no tenderness.  Abdominal: Soft. Bowel sounds are normal. She exhibits  no distension and no mass. There is no tenderness.  Musculoskeletal: Normal range of motion.  Neurological: She is alert and oriented to person, place, and time.  Skin: Skin is warm and dry.  Psychiatric: She has a normal mood and affect.     Lab Results  Component Value Date   HGBA1C 8.3 05/16/2017    Assessment & Plan:   1. Type 2 diabetes mellitus with diabetic neuropathy, with long-term current use of insulin (HCC) Uncontrolled with A1c of 8.3 Increase dose of Levemir Diabetic diet - POCT glucose (manual entry) - POCT glycosylated hemoglobin (Hb A1C) - Insulin Detemir (LEVEMIR FLEXTOUCH) 100 UNIT/ML Pen; Inject 25 Units into the skin 2 (two) times daily.  Dispense: 15 mL; Refill: 3 - glipiZIDE (GLUCOTROL) 10 MG tablet; TAKE 1 TABLET BY MOUTH 2 TIMES DAILY BEFORE A MEAL.  Dispense: 60 tablet; Refill: 6 - gabapentin (NEURONTIN) 300 MG capsule; Take 1 capsule (300 mg total) by mouth 2 (two) times daily.  Dispense: 60 capsule; Refill: 6  2. Essential hypertension Uncontrolled Amlodipine added to regimen - carvedilol (COREG) 12.5 MG tablet; TAKE 1 TABLET  BY MOUTH 2 TIMES DAILY WITH A MEAL.  Dispense: 60 tablet; Refill: 6 - losartan (COZAAR) 100 MG tablet; Take 1 tablet (100 mg total) by mouth daily.  Dispense: 30 tablet; Refill: 6 - atorvastatin (LIPITOR) 40 MG tablet; Take 1 tablet (40 mg total) by mouth daily. Needs office visit for refills  Dispense: 30 tablet; Refill: 6 - amLODipine (NORVASC) 5 MG tablet; Take 1 tablet (5 mg total) by mouth daily.  Dispense: 30 tablet; Refill: 6  3. Other fatigue - Vitamin D, 25-hydroxy  4. Hyperlipidemia, unspecified hyperlipidemia type Low cholesterol diet Continue atorvastatin  5. Insomnia, unspecified type - traZODone (DESYREL) 50 MG tablet; Take 1 tablet (50 mg total) by mouth at bedtime as needed for sleep.  Dispense: 30 tablet; Refill: 6  6. Other chronic sinusitis Could also explain epistaxis Advised to use humidifier, reviewed epistaxis precautions. - cetirizine (ZYRTEC) 10 MG tablet; Take 1 tablet (10 mg total) by mouth daily.  Dispense: 30 tablet; Refill: 1 - fluticasone (FLONASE) 50 MCG/ACT nasal spray; Place 2 sprays into both nostrils daily.  Dispense: 16 g; Refill: 6   Meds ordered this encounter  Medications  . Insulin Detemir (LEVEMIR FLEXTOUCH) 100 UNIT/ML Pen    Sig: Inject 25 Units into the skin 2 (two) times daily.    Dispense:  15 mL    Refill:  3    Discontinue previous dose  . carvedilol (COREG) 12.5 MG tablet    Sig: TAKE 1 TABLET BY MOUTH 2 TIMES DAILY WITH A MEAL.    Dispense:  60 tablet    Refill:  6  . glipiZIDE (GLUCOTROL) 10 MG tablet    Sig: TAKE 1 TABLET BY MOUTH 2 TIMES DAILY BEFORE A MEAL.    Dispense:  60 tablet    Refill:  6    Must have office visit for refills  . gabapentin (NEURONTIN) 300 MG capsule    Sig: Take 1 capsule (300 mg total) by mouth 2 (two) times daily.    Dispense:  60 capsule    Refill:  6    Discontinue previous dose  . pantoprazole (PROTONIX) 40 MG tablet    Sig: Take 1 tablet (40 mg total) by mouth daily.    Dispense:  30  tablet    Refill:  6    Must have office visit for refills  . cetirizine (ZYRTEC)  10 MG tablet    Sig: Take 1 tablet (10 mg total) by mouth daily.    Dispense:  30 tablet    Refill:  1  . losartan (COZAAR) 100 MG tablet    Sig: Take 1 tablet (100 mg total) by mouth daily.    Dispense:  30 tablet    Refill:  6  . atorvastatin (LIPITOR) 40 MG tablet    Sig: Take 1 tablet (40 mg total) by mouth daily. Needs office visit for refills    Dispense:  30 tablet    Refill:  6  . traZODone (DESYREL) 50 MG tablet    Sig: Take 1 tablet (50 mg total) by mouth at bedtime as needed for sleep.    Dispense:  30 tablet    Refill:  6  . fluticasone (FLONASE) 50 MCG/ACT nasal spray    Sig: Place 2 sprays into both nostrils daily.    Dispense:  16 g    Refill:  6  . amLODipine (NORVASC) 5 MG tablet    Sig: Take 1 tablet (5 mg total) by mouth daily.    Dispense:  30 tablet    Refill:  6    Follow-up: Return in about 3 months (around 08/16/2017) for Follow-up on chronic medical conditions.    This note has been created with Surveyor, quantity. Any transcriptional errors are unintentional.     Arnoldo Morale MD

## 2017-05-16 NOTE — Patient Instructions (Signed)
Hipertensión  Hypertension  La hipertensión, conocida comúnmente como presión arterial alta, se produce cuando la sangre bombea en las arterias con mucha fuerza. Las arterias son los vasos sanguíneos que transportan la sangre desde el corazón al resto del cuerpo. La hipertensión hace que el corazón haga más esfuerzo para bombear sangre y puede provocar que las arterias se estrechen o endurezcan. La hipertensión no tratada o no controlada puede causar infarto de miocardio, accidentes cerebrovasculares, enfermedad renal y otros problemas.  Una lectura de la presión arterial consiste de un número más alto sobre un número más bajo. En condiciones ideales, la presión arterial debe estar por debajo de 120/80. El primer número ("superior") es la presión sistólica. Es la medida de la presión de las arterias cuando el corazón late. El segundo número ("inferior") es la presión diastólica. Es la medida de la presión en las arterias cuando el corazón se relaja.  ¿Cuáles son las causas?  Se desconoce la causa de esta afección.  ¿Qué incrementa el riesgo?  Algunos factores de riesgo de hipertensión están bajo su control. Otros no.  Factores que puede modificar  · Fumar.  · Tener diabetes mellitus tipo 2, colesterol alto, o ambos.  · No hacer la cantidad suficiente de actividad física o ejercicio.  · Tener sobrepeso.  · Consumir mucha grasa, azúcar, calorías o sal (sodio) en su dieta.  · Beber alcohol en exceso.  Factores que son difíciles o imposibles de modificar  · Tener enfermedad renal crónica.  · Tener antecedentes familiares de presión arterial alta.  · La edad. Los riesgos aumentan con la edad.  · La raza. El riesgo es mayor para las personas afroamericanas.  · El sexo. Antes de los 45 años, los hombres corren más riesgo que las mujeres. Después de los 65 años, las mujeres corren más riesgo que los hombres.  · Tener apnea obstructiva del sueño.  · El estrés.  ¿Cuáles son los signos o los síntomas?   La presión arterial extremadamente alta (crisis hipertensiva) puede provocar:  · Dolor de cabeza.  · Ansiedad.  · Falta de aire.  · Hemorragia nasal.  · Náuseas y vómitos.  · Dolor de pecho intenso.  · Una crisis de movimientos que no puede controlar (convulsiones).    ¿Cómo se diagnostica?  Esta afección se diagnostica midiendo su presión arterial mientras se encuentra sentado, con el brazo apoyado sobre una superficie. El brazalete del tensiómetro debe colocarse directamente sobre la piel de la parte superior del brazo y al nivel de su corazón. Debe medirla al menos dos veces en el mismo brazo. Determinadas condiciones pueden causar una diferencia de presión arterial entre el brazo izquierdo y el derecho.  Ciertos factores pueden provocar que las lecturas de la presión arterial sean inferiores o superiores a lo normal (elevadas) por un período corto de tiempo:  · Si su presión arterial es más alta cuando se encuentra en el consultorio del médico que cuando la mide en su hogar, se denomina "hipertensión de bata blanca". La mayoría de las personas que tienen esta afección no deben ser medicadas.  · Si su presión arterial es más alta en el hogar que cuando se encuentra en el consultorio del médico, se denomina "hipertensión enmascarada". La mayoría de las personas que tienen esta afección deben ser medicadas para controlar la presión arterial.    Si tiene una lecturas de presión arterial alta durante una visita o si tiene presión arterial normal con otros factores de riesgo:  · Es posible que se le   pida que regrese otro día para volver a controlar su presión arterial.  · Se le puede pedir que se controle la presión arterial en su casa durante 1 semana o más.    Si se le diagnostica hipertensión, es posible que se le realicen otros análisis de sangre o estudios de diagnóstico por imágenes para ayudar a su médico a comprender su riesgo general de tener otras afecciones.  ¿Cómo se trata?   Esta afección se trata haciendo cambios saludables en el estilo de vida, tales como ingerir alimentos saludables, realizar más ejercicio y reducir el consumo de alcohol. El médico puede recetarle medicamentos si los cambios en el estilo de vida no son suficientes para lograr controlar la presión arterial y si:  · Su presión arterial sistólica está por encima de 130.  · Su presión arterial diastólica está por encima de 80.    La presión arterial deseada puede variar en función de las enfermedades, la edad y otros factores personales.  Siga estas instrucciones en su casa:  Comida y bebida  · Siga una dieta con alto contenido de fibras y potasio, y con bajo contenido de sodio, azúcar agregada y grasas. Un ejemplo de plan alimenticio es la dieta DASH (Dietary Approaches to Stop Hypertension, Métodos alimenticios para detener la hipertensión). Para alimentarse de esta manera:  ? Coma mucha fruta y verdura fresca. Trate de que la mitad del plato de cada comida sea de frutas y verduras.  ? Coma cereales integrales, como pasta integral, arroz integral y pan integral. Llene aproximadamente un cuarto del plato con cereales integrales.  ? Coma y beba productos lácteos con bajo contenido de grasa, como leche descremada o yogur bajo en grasas.  ? Evite la ingesta de cortes de carne grasa, carne procesada o curada, y carne de ave con piel. Llene aproximadamente un cuarto del plato con proteínas magras, como pescado, pollo sin piel, frijoles, huevos y tofu.  ? Evite ingerir alimentos prehechos o procesados. En general, estos tienen mayor cantidad de sodio, azúcar agregada y grasa.  · Reduzca su ingesta diaria de sodio. La mayoría de las personas que tienen hipertensión deben comer menos de 1500 mg de sodio por día.  · Limite el consumo de alcohol a no más de 1 medida por día si es mujer y no está embarazada y a 2 medidas por día si es hombre. Una medida equivale  a 12 onzas de cerveza, 5 onzas de vino o 1½ onzas de bebidas alcohólicas de alta graduación.  Estilo de vida  · Trabaje con su médico para mantener un peso saludable o perder peso. Pregúntele cual es su peso recomendado.  · Realice al menos 30 minutos de ejercicio que haga que se acelere su corazón (ejercicio aeróbico) la mayoría de los días de la semana. Estas actividades pueden incluir caminar, nadar o andar en bicicleta.  · Incluya ejercicios para fortalecer sus músculos (ejercicios de resistencia), como pilates o levantamiento de pesas, como parte de su rutina semanal de ejercicios. Intente realizar 30 minutos de este tipo de ejercicios al menos tres días a la semana.  · No consuma ningún producto que contenga nicotina o tabaco, como cigarrillos y cigarrillos electrónicos. Si necesita ayuda para dejar de fumar, consulte al médico.  · Contrólese la presión arterial en su casa según las indicaciones del médico.  · Concurra a todas las visitas de control como se lo haya indicado el médico. Esto es importante.  Medicamentos  · Tome los medicamentos de venta libre y los recetados solamente como se   lo haya indicado el médico. Siga cuidadosamente las indicaciones. Los medicamentos para la presión arterial deben tomarse según las indicaciones.  · No omita las dosis de medicamentos para la presión arterial. Si lo hace, estará en riesgo de tener problemas y puede hacer que los medicamentos sean menos eficaces.  · Pregúntele a su médico a qué efectos secundarios o reacciones a los medicamentos debe prestar atención.  Comuníquese con un médico si:  · Piensa que tiene una reacción a un medicamento que está tomando.  · Tiene dolores de cabeza frecuentes (recurrentes).  · Siente mareos.  · Tiene hinchazón en los tobillos.  · Tiene problemas de visión.  Solicite ayuda de inmediato si:  · Siente un dolor de cabeza intenso o confusión.  · Siente debilidad inusual o adormecimiento.  · Siente que va a desmayarse.   · Siente un dolor intenso en el pecho o el abdomen.  · Vomita repetidas veces.  · Tiene dificultad para respirar.  Resumen  · La hipertensión se produce cuando la sangre bombea en las arterias con mucha fuerza. Si esta afección no se controla, podría correr riesgo de tener complicaciones graves.  · La presión arterial deseada puede variar en función de las enfermedades, la edad y otros factores personales. Para la mayoría de las personas, una presión arterial normal es menor que 120/80.  · La hipertensión se trata con cambios en el estilo de vida, medicamentos o una combinación de ambos. Los cambios en el estilo de vida incluyen pérdida de peso, ingerir alimentos sanos, seguir una dieta baja en sodio, hacer más ejercicio y limitar el consumo de alcohol.  Esta información no tiene como fin reemplazar el consejo del médico. Asegúrese de hacerle al médico cualquier pregunta que tenga.  Document Released: 11/07/2005 Document Revised: 10/19/2016 Document Reviewed: 10/19/2016  Elsevier Interactive Patient Education © 2018 [employer]

## 2017-05-17 ENCOUNTER — Other Ambulatory Visit: Payer: Self-pay | Admitting: Family Medicine

## 2017-05-17 LAB — VITAMIN D 25 HYDROXY (VIT D DEFICIENCY, FRACTURES): Vit D, 25-Hydroxy: 25 ng/mL — ABNORMAL LOW (ref 30.0–100.0)

## 2017-05-17 MED ORDER — ERGOCALCIFEROL 1.25 MG (50000 UT) PO CAPS: 50000.0000 [IU] | ORAL_CAPSULE | ORAL | 1 refills | Status: DC

## 2017-05-17 MED FILL — ?PANTOPRAZOLE SOD DR 40MG: 40 MG | 30 days supply | Qty: 30 | Fill #0

## 2017-05-17 MED FILL — LOSARTAN POTASSIUM 100 MG T: 100 | 30 days supply | Qty: 30 | Fill #0

## 2017-05-17 MED FILL — AMLODIPINE BESYLATE 5 MG TA: 5 | 30 days supply | Qty: 30 | Fill #0

## 2017-05-17 MED FILL — VIT D2 1.25 MG (50,000 UNIT: 1.25 MG | 28 days supply | Qty: 4 | Fill #0

## 2017-05-17 MED FILL — FLUTICASONE PROP 50 MCG SPR: 50 | 30 days supply | Qty: 16 | Fill #0

## 2017-05-17 MED FILL — ATORVASTATIN 40 MG TABLET: 40 | 30 days supply | Qty: 30 | Fill #0

## 2017-05-17 MED FILL — traZODone HCL 50 MG TABS: 50 | 30 days supply | Qty: 30 | Fill #0

## 2017-05-17 MED FILL — glipiZIDE 10 MG TABS: 10 | 30 days supply | Qty: 60 | Fill #0

## 2017-05-17 MED FILL — ?CETIRIZINE HCL 10 MG TABLE: 10 | 30 days supply | Qty: 30 | Fill #0

## 2017-05-17 MED FILL — GABAPENTIN 300 MG CAPSULE: 300 | 30 days supply | Qty: 60 | Fill #0

## 2017-05-17 MED FILL — CARVEDILOL 12.5 MG TABLET: 12.5 | 30 days supply | Qty: 60 | Fill #0

## 2017-05-17 MED FILL — !LEVEMIR FLEXPEN 100UNITS/M: 100U/ML (3) | 12 days supply | Qty: 6 | Fill #0

## 2017-06-12 MED FILL — LEVEMIR FLEXTOUCH 100 UNITS: 100 | 12 days supply | Qty: 6 | Fill #1

## 2017-06-12 MED FILL — CARVEDILOL 12.5 MG TABLET: 12.5 | 30 days supply | Qty: 60 | Fill #1

## 2017-06-12 MED FILL — glipiZIDE 10 MG TABS: 10 | 30 days supply | Qty: 60 | Fill #1

## 2017-06-12 MED FILL — GABAPENTIN 300 MG CAPSULE: 300 | 30 days supply | Qty: 60 | Fill #1

## 2017-06-12 MED FILL — LOSARTAN POTASSIUM 100 MG T: 100 | 30 days supply | Qty: 30 | Fill #1

## 2017-07-17 ENCOUNTER — Other Ambulatory Visit: Payer: Self-pay | Admitting: Pharmacist

## 2017-07-17 MED ORDER — INSULIN GLARGINE 100 UNIT/ML SOLOSTAR PEN: 25.0000 [IU] | PEN_INJECTOR | Freq: Two times a day (BID) | SUBCUTANEOUS | 3 refills | Status: DC

## 2017-07-17 MED FILL — $LANTUS SOLOSTAR 100 UNITS/: 100 | 30 days supply | Qty: 15 | Fill #0

## 2017-07-18 MED FILL — ?PANTOPRAZOLE SOD DR 40MG: 40 MG | 30 days supply | Qty: 30 | Fill #1

## 2017-07-18 MED FILL — CARVEDILOL 12.5 MG TABLET: 12.5 | 30 days supply | Qty: 60 | Fill #2

## 2017-07-18 MED FILL — glipiZIDE 10 MG TABS: 10 | 30 days supply | Qty: 60 | Fill #2

## 2017-07-18 MED FILL — GABAPENTIN 300 MG CAPSULE: 300 | 30 days supply | Qty: 60 | Fill #2

## 2017-07-18 MED FILL — LOSARTAN POTASSIUM 100 MG T: 100 | 30 days supply | Qty: 30 | Fill #2

## 2017-08-11 ENCOUNTER — Emergency Department (HOSPITAL_COMMUNITY): Payer: Self-pay

## 2017-08-11 ENCOUNTER — Other Ambulatory Visit: Payer: Self-pay

## 2017-08-11 ENCOUNTER — Encounter (HOSPITAL_COMMUNITY): Payer: Self-pay | Admitting: Emergency Medicine

## 2017-08-11 ENCOUNTER — Emergency Department (HOSPITAL_COMMUNITY)
Admission: EM | Admit: 2017-08-11 | Discharge: 2017-08-11 | Disposition: A | Discharge status: Home / Self Care | Payer: Self-pay | Attending: Emergency Medicine | Admitting: Emergency Medicine

## 2017-08-11 DIAGNOSIS — R079 Chest pain, unspecified: Secondary | ICD-10-CM | POA: Insufficient documentation

## 2017-08-11 DIAGNOSIS — Z5321 Procedure and treatment not carried out due to patient leaving prior to being seen by health care provider: Secondary | ICD-10-CM | POA: Insufficient documentation

## 2017-08-11 LAB — BASIC METABOLIC PANEL
ANION GAP: 9 (ref 5–15)
BUN: 10 mg/dL (ref 6–20)
CALCIUM: 9.6 mg/dL (ref 8.9–10.3)
CO2: 20 mmol/L — AB (ref 22–32)
CREATININE: 0.66 mg/dL (ref 0.44–1.00)
Chloride: 106 mmol/L (ref 101–111)
Glucose, Bld: 173 mg/dL — ABNORMAL HIGH (ref 65–99)
Potassium: 3.8 mmol/L (ref 3.5–5.1)
SODIUM: 135 mmol/L (ref 135–145)

## 2017-08-11 LAB — I-STAT TROPONIN, ED: TROPONIN I, POC: 0 ng/mL (ref 0.00–0.08)

## 2017-08-11 LAB — CBC
HCT: 40.4 % (ref 36.0–46.0)
HEMOGLOBIN: 14.2 g/dL (ref 12.0–15.0)
MCH: 29.8 pg (ref 26.0–34.0)
MCHC: 35.1 g/dL (ref 30.0–36.0)
MCV: 84.9 fL (ref 78.0–100.0)
PLATELETS: 167 10*3/uL (ref 150–400)
RBC: 4.76 MIL/uL (ref 3.87–5.11)
RDW: 13.2 % (ref 11.5–15.5)
WBC: 6.6 10*3/uL (ref 4.0–10.5)

## 2017-08-11 LAB — BRAIN NATRIURETIC PEPTIDE: B Natriuretic Peptide: 48.3 pg/mL (ref 0.0–100.0)

## 2017-08-11 NOTE — ED Triage Notes (Signed)
Patient arrives with complaint of chest pain and shortness of breath. Onset in last few days. States unable to lay flat to sleep. Also states she is fatigued, has back pain, and headache.

## 2017-08-11 NOTE — ED Notes (Signed)
pts family came to the desk ask wait time .then thay left

## 2017-08-18 ENCOUNTER — Ambulatory Visit: Payer: Self-pay | Attending: Family Medicine | Admitting: Family Medicine

## 2017-08-21 MED FILL — glipiZIDE 10 MG TABS: 10 | 30 days supply | Qty: 60 | Fill #3

## 2017-08-21 MED FILL — ?CARVEDILOL 12.5 MG TABLET: 12.5 | 30 days supply | Qty: 60 | Fill #3

## 2017-08-21 MED FILL — LOSARTAN POTASSIUM 100 MG T: 100 | 30 days supply | Qty: 30 | Fill #3

## 2017-08-21 MED FILL — GABAPENTIN 300 MG CAPSULE: 300 | 30 days supply | Qty: 60 | Fill #3

## 2017-08-21 MED FILL — $LANTUS SOLOSTAR 100 UNITS/: 100 | 30 days supply | Qty: 15 | Fill #1

## 2017-08-24 MED FILL — ?ATORVASTATIN 40MG TABLET: 40 | 30 days supply | Qty: 30 | Fill #1

## 2017-10-09 ENCOUNTER — Ambulatory Visit: Payer: Self-pay | Admitting: Family Medicine

## 2017-10-24 ENCOUNTER — Telehealth: Payer: Self-pay | Admitting: Adult Health

## 2017-10-24 NOTE — Telephone Encounter (Signed)
PAL. Moved 12/7 visit to 12/14. Called patient via [employer] [phone]). Spoke with dtr who speaks [place]. Per dtr moved visit from 12/14 to 12/27.

## 2017-10-27 ENCOUNTER — Encounter: Payer: Self-pay | Admitting: Adult Health

## 2017-10-27 ENCOUNTER — Encounter: Payer: No Typology Code available for payment source | Admitting: Adult Health

## 2017-11-03 ENCOUNTER — Encounter: Payer: Self-pay | Admitting: Adult Health

## 2017-11-16 ENCOUNTER — Encounter: Payer: Self-pay | Admitting: Adult Health

## 2017-11-27 ENCOUNTER — Ambulatory Visit: Payer: Self-pay | Attending: Family Medicine | Admitting: Family Medicine

## 2017-11-27 ENCOUNTER — Encounter: Payer: Self-pay | Admitting: Family Medicine

## 2017-11-27 VITALS — BP 150/72 | HR 58 | Temp 97.6°F | Ht 60.0 in | Wt 159.2 lb

## 2017-11-27 DIAGNOSIS — E1140 Type 2 diabetes mellitus with diabetic neuropathy, unspecified: Secondary | ICD-10-CM

## 2017-11-27 DIAGNOSIS — Z7982 Long term (current) use of aspirin: Secondary | ICD-10-CM | POA: Insufficient documentation

## 2017-11-27 DIAGNOSIS — E785 Hyperlipidemia, unspecified: Secondary | ICD-10-CM | POA: Insufficient documentation

## 2017-11-27 DIAGNOSIS — E7800 Pure hypercholesterolemia, unspecified: Secondary | ICD-10-CM

## 2017-11-27 DIAGNOSIS — I10 Essential (primary) hypertension: Secondary | ICD-10-CM

## 2017-11-27 DIAGNOSIS — I2510 Atherosclerotic heart disease of native coronary artery without angina pectoris: Secondary | ICD-10-CM | POA: Insufficient documentation

## 2017-11-27 DIAGNOSIS — Z79899 Other long term (current) drug therapy: Secondary | ICD-10-CM | POA: Insufficient documentation

## 2017-11-27 DIAGNOSIS — R0600 Dyspnea, unspecified: Secondary | ICD-10-CM | POA: Insufficient documentation

## 2017-11-27 DIAGNOSIS — K219 Gastro-esophageal reflux disease without esophagitis: Secondary | ICD-10-CM

## 2017-11-27 DIAGNOSIS — F329 Major depressive disorder, single episode, unspecified: Secondary | ICD-10-CM | POA: Insufficient documentation

## 2017-11-27 DIAGNOSIS — Z853 Personal history of malignant neoplasm of breast: Secondary | ICD-10-CM | POA: Insufficient documentation

## 2017-11-27 DIAGNOSIS — E1142 Type 2 diabetes mellitus with diabetic polyneuropathy: Secondary | ICD-10-CM

## 2017-11-27 DIAGNOSIS — Z794 Long term (current) use of insulin: Secondary | ICD-10-CM | POA: Insufficient documentation

## 2017-11-27 LAB — GLUCOSE, POCT (MANUAL RESULT ENTRY): POC Glucose: 247 mg/dl — AB (ref 70–99)

## 2017-11-27 LAB — POCT GLYCOSYLATED HEMOGLOBIN (HGB A1C): HEMOGLOBIN A1C: 7.9

## 2017-11-27 MED ORDER — LOSARTAN POTASSIUM 100 MG PO TABS: 100.0000 mg | ORAL_TABLET | Freq: Every day | ORAL | 6 refills | Status: DC

## 2017-11-27 MED ORDER — CARVEDILOL 12.5 MG PO TABS: ORAL_TABLET | ORAL | 6 refills | Status: DC

## 2017-11-27 MED ORDER — GLIPIZIDE 10 MG PO TABS
ORAL_TABLET | ORAL | 6 refills | Status: DC
Start: 2017-11-27 — End: 2018-07-03

## 2017-11-27 MED ORDER — AMLODIPINE BESYLATE 5 MG PO TABS: 5.0000 mg | ORAL_TABLET | Freq: Every day | ORAL | 6 refills | Status: DC

## 2017-11-27 MED ORDER — GABAPENTIN 300 MG PO CAPS: 600.0000 mg | ORAL_CAPSULE | Freq: Two times a day (BID) | ORAL | 6 refills | Status: DC

## 2017-11-27 MED ORDER — PANTOPRAZOLE SODIUM 40 MG PO TBEC: 40.0000 mg | DELAYED_RELEASE_TABLET | Freq: Every day | ORAL | 6 refills | Status: DC

## 2017-11-27 MED ORDER — INSULIN GLARGINE 100 UNIT/ML SOLOSTAR PEN
18.0000 [IU] | PEN_INJECTOR | Freq: Two times a day (BID) | SUBCUTANEOUS | 3 refills | Status: DC
Start: 2017-11-27 — End: 2018-08-07

## 2017-11-27 MED ORDER — ATORVASTATIN CALCIUM 40 MG PO TABS: 40.0000 mg | ORAL_TABLET | Freq: Every day | ORAL | 6 refills | Status: DC

## 2017-11-27 MED FILL — ?CARVEDILOL 12.5 MG TABLET: 12.5 | 30 days supply | Qty: 60 | Fill #0

## 2017-11-27 MED FILL — GABAPENTIN 300 MG CAPSULE: 300 | 15 days supply | Qty: 60 | Fill #0

## 2017-11-27 MED FILL — ?ATORVASTATIN 40MG TABLET: 40 | 30 days supply | Qty: 30 | Fill #0

## 2017-11-27 MED FILL — ?PANTOPRAZOLE SOD DR 40MG: 40 MG | 30 days supply | Qty: 30 | Fill #0

## 2017-11-27 MED FILL — LOSARTAN POTASSIUM 100 MG T: 100 | 30 days supply | Qty: 30 | Fill #0

## 2017-11-27 MED FILL — glipiZIDE 10 MG TABS: 10 | 30 days supply | Qty: 60 | Fill #0

## 2017-11-27 MED FILL — AMLODIPINE BESYLATE 5 MG TA: 5 | 30 days supply | Qty: 30 | Fill #0

## 2017-11-27 MED FILL — $LANTUS SOLOSTAR 100 UNITS/: 100 | 25 days supply | Qty: 9 | Fill #0

## 2017-11-27 NOTE — Progress Notes (Signed)
Subjective:  Patient ID: Brandi Bryant, female    DOB: July 04, 1956  Age: 62 y.o. MRN: [id number]  CC: Diabetes   HPI Brandi Bryant is a 62 year old female with a history of GERD, hypertension, hyperlipidemia, type 2 diabetes mellitus (A1c 7.9) who comes in for a follow-up visit.  She was away at [place] visiting her daughter and during that period was seen by a primary care physician there who had reduced her Lantus from 25 units twice daily to 15 units twice daily but due to the fact that her blood sugars were controlled. Today she informs me her fasting sugars are in the 150 range and random sugars are somewhere between 250 and 300. She takes gabapentin for diabetic neuropathy but planes of feeling groggy with gabapentin. Denies visual concerns or hypoglycemia.  Blood pressure is elevated and she endorses compliance with her antihypertensive and a low-sodium diet Reflux symptoms are controlled. She has been compliant with her statin and denies myalgias or other adverse effects.  Past Medical History:  Diagnosis Date  . Abdominal pain   . Arthralgia 08/13/2013  . Bell palsy 2006  . Bell's palsy   . Breast cancer ([place])    Breast - surgery, chemo and radiation  . Chest pain 09/30/2013  . Cholelithiasis   . Colon cancer screening 10/27/2015  . Complication of anesthesia    " tubal ligation, in [place]"  had weakness, couldnt stand up the next day, was given pills for oxygen for Brain."  1 month after I was having loss of attentiviness, still have to focus  carefully.   . Coronary artery disease   . Depression   . Diabetes mellitus    Type II  . Dyspnea    at times- when air conditioner is runnimg, heat also   . Encounter for screening colonoscopy 10/30/2015  . Hepatic steatosis   . History of breast cancer 06/01/2012  . HTN (hypertension)     Past Surgical History:  Procedure Laterality Date  . ANKLE ARTHROSCOPY Right 12/14/2016   Procedure: Right Ankle  Arthroscopic Debridement;  Surgeon: Newt Minion, MD;  Location: [facility];  Service: Orthopedics;  Laterality: Right;  . Arm surgery     Left tendon lengthen  . BREAST LUMPECTOMY Left 2010  . CHOLECYSTECTOMY N/A 12/09/2015   Procedure: LAPAROSCOPIC CHOLECYSTECTOMY WITH INTRAOPERATIVE CHOLANGIOGRAM;  Surgeon: Greer Pickerel, MD;  Location: [facility];  Service: General;  Laterality: N/A;  . LEFT HEART CATHETERIZATION WITH CORONARY ANGIOGRAM N/A 10/30/2013   Procedure: LEFT HEART CATHETERIZATION WITH CORONARY ANGIOGRAM;  Surgeon: Josue Hector, MD;  Location: [facility] CATH LAB;  Service: Cardiovascular;  Laterality: N/A;  . porta cath Right   . Removal of Porta cath Right   . TUBAL LIGATION      Allergies  Allergen Reactions  . Gadolinium Derivatives Nausea And Vomiting    Code: VOM, Desc: Pt began vomiting 45 sec after MRI contrast injection of Multihance, Onset Date: [id number]       Outpatient Medications Prior to Visit  Medication Sig Dispense Refill  . albuterol (PROVENTIL HFA;VENTOLIN HFA) 108 (90 Base) MCG/ACT inhaler Inhale 1-2 puffs into the lungs every 6 (six) hours as needed for wheezing or shortness of breath. 54 g 3  . aspirin 81 MG tablet Take 81 mg by mouth daily.    . calcium carbonate (TUMS - DOSED IN MG ELEMENTAL CALCIUM) 500 MG chewable tablet Chew 1 tablet by mouth daily. Reported on 05/19/2016    . cetirizine (ZYRTEC) 10 MG  tablet Take 1 tablet (10 mg total) by mouth daily. 30 tablet 1  . ergocalciferol (DRISDOL) 50000 units capsule Take 1 capsule (50,000 Units total) by mouth once a week. (Patient not taking: Reported on 11/27/2017) 9 capsule 1  . fluticasone (FLONASE) 50 MCG/ACT nasal spray Place 2 sprays into both nostrils daily. 16 g 6  . ibuprofen (ADVIL,MOTRIN) 200 MG tablet Take 400-600 mg by mouth every 6 (six) hours as needed for mild pain.    . Insulin Pen Needle (B-D ULTRAFINE III SHORT PEN) 31G X 8 MM MISC Use for nightly insulin injections (Patient not taking: Reported on  02/06/2017) 100 each 11  . lactulose, encephalopathy, (CHRONULAC) 10 GM/15ML SOLN TAKE 15 MLS BY MOUTH 3 TIMES DAILY. 946 mL 0  . traZODone (DESYREL) 50 MG tablet Take 1 tablet (50 mg total) by mouth at bedtime as needed for sleep. (Patient not taking: Reported on 11/27/2017) 30 tablet 6  . amLODipine (NORVASC) 5 MG tablet Take 1 tablet (5 mg total) by mouth daily. 30 tablet 6  . atorvastatin (LIPITOR) 40 MG tablet Take 1 tablet (40 mg total) by mouth daily. Needs office visit for refills 30 tablet 6  . carvedilol (COREG) 12.5 MG tablet TAKE 1 TABLET BY MOUTH 2 TIMES DAILY WITH A MEAL. 60 tablet 6  . gabapentin (NEURONTIN) 300 MG capsule Take 1 capsule (300 mg total) by mouth 2 (two) times daily. 60 capsule 6  . glipiZIDE (GLUCOTROL) 10 MG tablet TAKE 1 TABLET BY MOUTH 2 TIMES DAILY BEFORE A MEAL. 60 tablet 6  . Insulin Glargine (LANTUS SOLOSTAR) 100 UNIT/ML Solostar Pen Inject 25 Units into the skin 2 (two) times daily. 15 mL 3  . losartan (COZAAR) 100 MG tablet Take 1 tablet (100 mg total) by mouth daily. 30 tablet 6  . pantoprazole (PROTONIX) 40 MG tablet Take 1 tablet (40 mg total) by mouth daily. (Patient not taking: Reported on 11/27/2017) 30 tablet 6   Facility-Administered Medications Prior to Visit  Medication Dose Route Frequency Provider Last Rate Last Dose  . aspirin tablet 325 mg  325 mg Oral Daily Keck, Valerie A, NP   325 mg at 09/02/15 1628    ROS Review of Systems  Constitutional: Negative for activity change, appetite change and fatigue.  HENT: Negative for congestion, sinus pressure and sore throat.   Eyes: Negative for visual disturbance.  Respiratory: Negative for cough, chest tightness, shortness of breath and wheezing.   Cardiovascular: Negative for chest pain and palpitations.  Gastrointestinal: Negative for abdominal distention, abdominal pain and constipation.  Endocrine: Negative for polydipsia.  Genitourinary: Negative for dysuria and frequency.  Musculoskeletal:  Negative for arthralgias and back pain.  Skin: Negative for rash.  Neurological: Negative for tremors, light-headedness and numbness.  Hematological: Does not bruise/bleed easily.  Psychiatric/Behavioral: Negative for agitation and behavioral problems.    Objective:  BP (!) 150/72   Pulse (!) 58   Temp 97.6 F (36.4 C) (Oral)   Ht 5' (1.524 m)   Wt 159 lb 3.2 oz (72.2 kg)   SpO2 97%   BMI 31.09 kg/m   BP/Weight 11/27/2017 08/11/2017 05/16/2017  Systolic BP 150 159 165  Diastolic BP 72 82 78  Wt. (Lbs) 159.2 - 157.8  BMI 31.09 - 30.82      Physical Exam  Constitutional: She is oriented to person, place, and time. She appears well-developed and well-nourished.  Cardiovascular: Normal rate, normal heart sounds and intact distal pulses.  No murmur heard. Pulmonary/Chest: Effort normal   and breath sounds normal. She has no wheezes. She has no rales. She exhibits no tenderness.  Abdominal: Soft. Bowel sounds are normal. She exhibits no distension and no mass. There is no tenderness.  Musculoskeletal: Normal range of motion.  Neurological: She is alert and oriented to person, place, and time.  Skin: Skin is warm and dry.  Psychiatric: She has a normal mood and affect.     CMP Latest Ref Rng & Units 08/11/2017 12/13/2016 12/08/2016  Glucose 65 - 99 mg/dL 173(H) 228(H) 274(H)  BUN 6 - 20 mg/dL _0 Creatinine 0.44 - 1.00 mg/dL 0.66 0.68 0.69  Sodium 135 - 145 mmol/L 135 140 137  Potassium 3.5 - 5.1 mmol/L 3.8 4.0 4.0  Chloride 101 - 111 mmol/L 106 105 104  CO2 22 - 32 mmol/L 20(L) 23 22  Calcium 8.9 - 10.3 mg/dL 9.6 9.2 9.7  Total Protein 6.1 - 8.1 g/dL - 6.8 7.3  Total Bilirubin 0.2 - 1.2 mg/dL - 1.0 0.9  Alkaline Phos 33 - 130 U/L - 83 85  AST 10 - 35 U/L - 40(H) 75(H)  ALT 6 - 29 U/L - 35(H) 53    Lipid Panel     Component Value Date/Time   CHOL 120 12/13/2016 0841   TRIG 149 12/13/2016 0841   HDL 37 (L) 12/13/2016 0841   CHOLHDL 3.2 12/13/2016 0841   VLDL 26  04/18/2014 0855   LDLCALC 82 04/18/2014 0855    Lab Results  Component Value Date   HGBA1C 7.9 11/27/2017    Assessment & Plan:   1. Type 2 diabetes mellitus with diabetic neuropathy, with long-term current use of insulin (HCC) Not fully optimized with A1c of 7.9 She has been taking 15 units of Lantus twice daily rather than 25 units twice daily which was prescribed Advised to increase to 20 units twice daily Diabetic diet - POCT glucose (manual entry) - POCT glycosylated hemoglobin (Hb A1C) - gabapentin (NEURONTIN) 300 MG capsule; Take 2 capsules (600 mg total) by mouth 2 (two) times daily.  Dispense: 60 capsule; Refill: 6 - glipiZIDE (GLUCOTROL) 10 MG tablet; TAKE 1 TABLET BY MOUTH 2 TIMES DAILY BEFORE A MEAL.  Dispense: 60 tablet; Refill: 6 - Insulin Glargine (LANTUS SOLOSTAR) 100 UNIT/ML Solostar Pen; Inject 18 Units into the skin 2 (two) times daily.  Dispense: 15 mL; Refill: 3 - CMP14+EGFR; Future - Lipid panel; Future  2. Essential hypertension Uncontrolled Advised to work on low-sodium, DASH diet If BP is still elevated at next visit, will increase dose of amlodipine. - amLODipine (NORVASC) 5 MG tablet; Take 1 tablet (5 mg total) by mouth daily.  Dispense: 30 tablet; Refill: 6 - carvedilol (COREG) 12.5 MG tablet; TAKE 1 TABLET BY MOUTH 2 TIMES DAILY WITH A MEAL.  Dispense: 60 tablet; Refill: 6 - losartan (COZAAR) 100 MG tablet; Take 1 tablet (100 mg total) by mouth daily.  Dispense: 30 tablet; Refill: 6  3. Pure hypercholesterolemia Controlled - atorvastatin (LIPITOR) 40 MG tablet; Take 1 tablet (40 mg total) by mouth daily.  Dispense: 30 tablet; Refill: 6  4. Diabetic polyneuropathy associated with type 2 diabetes mellitus ([place])  Controlled on gabapentin Advised to take medication at night due to complaints of sedation  5. Gastroesophageal reflux disease without esophagitis Stable   Meds ordered this encounter  Medications  . amLODipine (NORVASC) 5 MG tablet     Sig: Take 1 tablet (5 mg total) by mouth daily.    Dispense:  30 tablet  Refill:  6  . atorvastatin (LIPITOR) 40 MG tablet    Sig: Take 1 tablet (40 mg total) by mouth daily.    Dispense:  30 tablet    Refill:  6  . carvedilol (COREG) 12.5 MG tablet    Sig: TAKE 1 TABLET BY MOUTH 2 TIMES DAILY WITH A MEAL.    Dispense:  60 tablet    Refill:  6  . gabapentin (NEURONTIN) 300 MG capsule    Sig: Take 2 capsules (600 mg total) by mouth 2 (two) times daily.    Dispense:  60 capsule    Refill:  6    Discontinue previous dose  . losartan (COZAAR) 100 MG tablet    Sig: Take 1 tablet (100 mg total) by mouth daily.    Dispense:  30 tablet    Refill:  6  . glipiZIDE (GLUCOTROL) 10 MG tablet    Sig: TAKE 1 TABLET BY MOUTH 2 TIMES DAILY BEFORE A MEAL.    Dispense:  60 tablet    Refill:  6  . Insulin Glargine (LANTUS SOLOSTAR) 100 UNIT/ML Solostar Pen    Sig: Inject 18 Units into the skin 2 (two) times daily.    Dispense:  15 mL    Refill:  3  . pantoprazole (PROTONIX) 40 MG tablet    Sig: Take 1 tablet (40 mg total) by mouth daily.    Dispense:  30 tablet    Refill:  6    Follow-up: Return in about 3 months (around 02/25/2018) for follow up of chronic medical conditions.   Arnoldo Morale MD

## 2017-11-28 ENCOUNTER — Ambulatory Visit: Payer: Self-pay | Attending: Family Medicine

## 2017-11-28 DIAGNOSIS — Z794 Long term (current) use of insulin: Secondary | ICD-10-CM | POA: Insufficient documentation

## 2017-11-28 DIAGNOSIS — E1140 Type 2 diabetes mellitus with diabetic neuropathy, unspecified: Secondary | ICD-10-CM | POA: Insufficient documentation

## 2017-11-28 NOTE — Progress Notes (Signed)
Patient her for lab visit 

## 2017-11-29 ENCOUNTER — Other Ambulatory Visit: Payer: Self-pay | Admitting: Family Medicine

## 2017-11-29 LAB — LIPID PANEL
CHOL/HDL RATIO: 3.3 ratio (ref 0.0–4.4)
Cholesterol, Total: 130 mg/dL (ref 100–199)
HDL: 40 mg/dL (ref >39)
LDL Calculated: 56 mg/dL (ref 0–99)
Triglycerides: 170 mg/dL — ABNORMAL HIGH (ref 0–149)
VLDL CHOLESTEROL CAL: 34 mg/dL (ref 5–40)

## 2017-11-29 LAB — CMP14+EGFR
A/G RATIO: 1.1 — AB (ref 1.2–2.2)
ALK PHOS: 119 IU/L — AB (ref 39–117)
ALT: 27 IU/L (ref 0–32)
AST: 34 IU/L (ref 0–40)
Albumin: 4 g/dL (ref 3.6–4.8)
BILIRUBIN TOTAL: 0.9 mg/dL (ref 0.0–1.2)
BUN/Creatinine Ratio: 17 (ref 12–28)
BUN: 15 mg/dL (ref 8–27)
CHLORIDE: 102 mmol/L (ref 96–106)
CO2: 20 mmol/L (ref 20–29)
Calcium: 9.5 mg/dL (ref 8.7–10.3)
Creatinine, Ser: 0.87 mg/dL (ref 0.57–1.00)
GFR calc Af Amer: 84 mL/min/{1.73_m2} (ref >59)
GFR calc non Af Amer: 73 mL/min/{1.73_m2} (ref >59)
GLUCOSE: 220 mg/dL — AB (ref 65–99)
Globulin, Total: 3.6 g/dL (ref 1.5–4.5)
POTASSIUM: 4.2 mmol/L (ref 3.5–5.2)
Sodium: 138 mmol/L (ref 134–144)
Total Protein: 7.6 g/dL (ref 6.0–8.5)

## 2017-12-27 ENCOUNTER — Telehealth: Payer: Self-pay | Admitting: Family Medicine

## 2017-12-27 DIAGNOSIS — Z853 Personal history of malignant neoplasm of breast: Secondary | ICD-10-CM

## 2017-12-27 NOTE — Telephone Encounter (Signed)
Pt called to request a Mammographic since she feel a bump in the breast and is painful now, please follow up

## 2017-12-28 NOTE — Telephone Encounter (Signed)
Could you please schedule this mammogram for the patient? She has a history of breast cancer. Thank you

## 2017-12-28 NOTE — Telephone Encounter (Signed)
CMA called patient to inform her Brandi Bryant needs a renewal, she needs to pick up financial assistance packets, and call the mammogram scholarship for her mammogram.  [employer] assisted with the call.

## 2018-01-05 ENCOUNTER — Other Ambulatory Visit (HOSPITAL_COMMUNITY): Payer: Self-pay | Admitting: *Deleted

## 2018-01-05 DIAGNOSIS — N6310 Unspecified lump in the right breast, unspecified quadrant: Secondary | ICD-10-CM

## 2018-01-08 ENCOUNTER — Encounter (HOSPITAL_COMMUNITY): Payer: Self-pay | Admitting: Emergency Medicine

## 2018-01-08 ENCOUNTER — Ambulatory Visit (HOSPITAL_COMMUNITY)
Admission: EM | Admit: 2018-01-08 | Discharge: 2018-01-08 | Disposition: A | Discharge status: Home / Self Care | Payer: Self-pay | Attending: Urgent Care | Admitting: Urgent Care

## 2018-01-08 DIAGNOSIS — I10 Essential (primary) hypertension: Secondary | ICD-10-CM

## 2018-01-08 DIAGNOSIS — R030 Elevated blood-pressure reading, without diagnosis of hypertension: Secondary | ICD-10-CM

## 2018-01-08 DIAGNOSIS — Z9109 Other allergy status, other than to drugs and biological substances: Secondary | ICD-10-CM

## 2018-01-08 DIAGNOSIS — Z8673 Personal history of transient ischemic attack (TIA), and cerebral infarction without residual deficits: Secondary | ICD-10-CM

## 2018-01-08 DIAGNOSIS — R519 Headache, unspecified: Secondary | ICD-10-CM

## 2018-01-08 DIAGNOSIS — R51 Headache: Secondary | ICD-10-CM

## 2018-01-08 DIAGNOSIS — R52 Pain, unspecified: Secondary | ICD-10-CM

## 2018-01-08 DIAGNOSIS — R110 Nausea: Secondary | ICD-10-CM

## 2018-01-08 DIAGNOSIS — R6889 Other general symptoms and signs: Secondary | ICD-10-CM

## 2018-01-08 DIAGNOSIS — R5381 Other malaise: Secondary | ICD-10-CM

## 2018-01-08 LAB — POCT I-STAT, CHEM 8
BUN: 9 mg/dL (ref 6–20)
Calcium, Ion: 1.15 mmol/L (ref 1.15–1.40)
Chloride: 108 mmol/L (ref 101–111)
Creatinine, Ser: 0.7 mg/dL (ref 0.44–1.00)
GLUCOSE: 79 mg/dL (ref 65–99)
HEMATOCRIT: 42 % (ref 36.0–46.0)
HEMOGLOBIN: 14.3 g/dL (ref 12.0–15.0)
POTASSIUM: 4.1 mmol/L (ref 3.5–5.1)
SODIUM: 143 mmol/L (ref 135–145)
TCO2: 24 mmol/L (ref 22–32)

## 2018-01-08 LAB — POCT URINALYSIS DIP (DEVICE)
Bilirubin Urine: NEGATIVE
GLUCOSE, UA: NEGATIVE mg/dL
Hgb urine dipstick: NEGATIVE
Ketones, ur: NEGATIVE mg/dL
LEUKOCYTES UA: NEGATIVE
NITRITE: NEGATIVE
Protein, ur: NEGATIVE mg/dL
Specific Gravity, Urine: 1.015 (ref 1.005–1.030)
UROBILINOGEN UA: 0.2 mg/dL (ref 0.0–1.0)
pH: 6.5 (ref 5.0–8.0)

## 2018-01-08 MED ORDER — AMLODIPINE BESYLATE 5 MG PO TABS: 5.0000 mg | ORAL_TABLET | Freq: Every day | ORAL | 0 refills | Status: DC

## 2018-01-08 NOTE — Discharge Instructions (Signed)
Tome 500mg  de Tylenol con cada 6 horas con comida para dolor y inflammacion. Tome 1 galon de agua al dia.

## 2018-01-08 NOTE — ED Provider Notes (Signed)
MRN: [id number] DOB: Apr 20, 1956  Subjective:   Brandi Bryant is a 62 y.o. female presenting for 4 subjective fever, headaches, nausea without vomiting, decreased appetite, bilateral foot numbness, chills over her legs, body aches, dizziness. Has tried ibuprofen, APAP with minimal relief. Denies sinus pain, sinus congestion, ear pain, cough, sore throat, chest pain, shob. Regarding HTN, takes losartan. Denies smoking cigarettes. Admits history of mild allergies to pollen. Has a history of Bell's palsy.   Brandi Bryant is allergic to gadolinium derivatives.  Brandi Bryant  has a past medical history of Abdominal pain, Arthralgia (08/13/2013), Bell palsy (2006), Bell's palsy, Breast cancer ([place]), Chest pain (09/30/2013), Cholelithiasis, Colon cancer screening (12/26/971), Complication of anesthesia, Coronary artery disease, Depression, Diabetes mellitus, Dyspnea, Encounter for screening colonoscopy (10/30/2015), Hepatic steatosis, History of breast cancer (06/01/2012), and HTN (hypertension). Also  has a past surgical history that includes Tubal ligation; Breast lumpectomy (Left, 2010); left heart catheterization with coronary angiogram (N/A, 10/30/2013); Cholecystectomy (N/A, 12/09/2015); porta cath (Right); Removal of Porta cath (Right); Arm surgery; and Ankle arthroscopy (Right, 12/14/2016).  Objective:   Vitals: BP (!) 178/64   Pulse (!) 56   Temp (!) 97.5 F (36.4 C) (Temporal)   Resp 16   Wt 159 lb (72.1 kg)   SpO2 99%   BMI 31.05 kg/m   BP Readings from Last 3 Encounters:  01/08/18 (!) 178/64  11/27/17 (!) 150/72  08/11/17 (!) 159/82    Physical Exam  Constitutional: She is oriented to person, place, and time. She appears well-developed and well-nourished.  HENT:  Mouth/Throat: Oropharynx is clear and moist.  Eyes: EOM are normal. Pupils are equal, round, and reactive to light. Right eye exhibits no discharge. Left eye exhibits no discharge.  Neck: Normal range of motion. Neck supple.   Cardiovascular: Normal rate, regular rhythm and intact distal pulses. Exam reveals no gallop and no friction rub.  No murmur heard. Pulmonary/Chest: No respiratory distress. She has no wheezes. She has no rales.  Lymphadenopathy:    She has no cervical adenopathy.  Neurological: She is alert and oriented to person, place, and time. She displays normal reflexes. Coordination normal.  Bell's palsy noted over right side (not new). Negative Romberg and pronator drift. Otherwise, cranial nerves intact.  Skin: Skin is warm and dry.  Psychiatric: She has a normal mood and affect.   Results for orders placed or performed during the hospital encounter of 01/08/18 (from the past 24 hour(s))  POCT urinalysis dip (device)     Status: None   Collection Time: 01/08/18  4:02 PM  Result Value Ref Range   Glucose, UA NEGATIVE NEGATIVE mg/dL   Bilirubin Urine NEGATIVE NEGATIVE   Ketones, ur NEGATIVE NEGATIVE mg/dL   Specific Gravity, Urine 1.015 1.005 - 1.030   Hgb urine dipstick NEGATIVE NEGATIVE   pH 6.5 5.0 - 8.0   Protein, ur NEGATIVE NEGATIVE mg/dL   Urobilinogen, UA 0.2 0.0 - 1.0 mg/dL   Nitrite NEGATIVE NEGATIVE   Leukocytes, UA NEGATIVE NEGATIVE  I-STAT, chem 8     Status: None   Collection Time: 01/08/18  4:03 PM  Result Value Ref Range   Sodium 143 135 - 145 mmol/L   Potassium 4.1 3.5 - 5.1 mmol/L   Chloride 108 101 - 111 mmol/L   BUN 9 6 - 20 mg/dL   Creatinine, Ser 0.70 0.44 - 1.00 mg/dL   Glucose, Bld 79 65 - 99 mg/dL   Calcium, Ion 1.15 1.15 - 1.40 mmol/L   TCO2 24 22 - 32  mmol/L   Hemoglobin 14.3 12.0 - 15.0 g/dL   HCT 42.0 36.0 - 46.0 %   Assessment and Plan :   Flu-like symptoms  Nonintractable headache, unspecified chronicity pattern, unspecified headache type  Malaise  Nausea without vomiting  Body aches  Environmental allergies  History of stroke  Essential hypertension - Plan: amLODipine (NORVASC) 5 MG tablet  Elevated blood pressure reading  Will manage  for viral type illness with supportive care. Start amlodipine to help with HTN, maintain losartan. Strict ER and return-to-clinic precautions discussed, patient verbalized understanding.    Jaynee Eagles, [place] 01/08/18 [id number]

## 2018-01-08 NOTE — ED Triage Notes (Signed)
Sick for four days with fever, headaches, bilateral foot numbness, chills, low appetite.   Nausea as well.

## 2018-01-10 MED FILL — ?PANTOPRAZOLE SOD DR 40MG T: 40 | 30 days supply | Qty: 30 | Fill #1

## 2018-01-10 MED FILL — $LANTUS SOLOSTAR 100 UNITS/: 100 | 25 days supply | Qty: 9 | Fill #1

## 2018-01-10 MED FILL — GABAPENTIN 300 MG CAPSULE: 300 | 15 days supply | Qty: 60 | Fill #1

## 2018-01-10 MED FILL — ?AMLODIPINE BESYLATE 5 MG T: 5 MG | 30 days supply | Qty: 30 | Fill #1

## 2018-01-10 MED FILL — glipiZIDE 10 MG TABS: 10 | 30 days supply | Qty: 60 | Fill #1

## 2018-01-10 MED FILL — ?CARVEDILOL 12.5 MG TABLET: 12.5 | 30 days supply | Qty: 60 | Fill #1

## 2018-01-18 ENCOUNTER — Encounter: Payer: Self-pay | Admitting: Cardiology

## 2018-01-18 ENCOUNTER — Ambulatory Visit (INDEPENDENT_AMBULATORY_CARE_PROVIDER_SITE_OTHER): Payer: Self-pay | Admitting: Cardiology

## 2018-01-18 VITALS — BP 120/70 | HR 61 | Ht 60.0 in | Wt 155.0 lb

## 2018-01-18 DIAGNOSIS — I5033 Acute on chronic diastolic (congestive) heart failure: Secondary | ICD-10-CM

## 2018-01-18 DIAGNOSIS — I309 Acute pericarditis, unspecified: Secondary | ICD-10-CM

## 2018-01-18 DIAGNOSIS — E785 Hyperlipidemia, unspecified: Secondary | ICD-10-CM

## 2018-01-18 DIAGNOSIS — I10 Essential (primary) hypertension: Secondary | ICD-10-CM

## 2018-01-18 MED ORDER — FUROSEMIDE 20 MG PO TABS: 20.0000 mg | ORAL_TABLET | Freq: Every day | ORAL | 0 refills | Status: DC

## 2018-01-18 MED ORDER — COLCHICINE 0.6 MG PO TABS: 0.6000 mg | ORAL_TABLET | Freq: Two times a day (BID) | ORAL | 0 refills | Status: DC

## 2018-01-18 NOTE — Patient Instructions (Signed)
Medication Instructions:   START TAKING LASIX 20 MG ONCE DAILY FOR 7 DAYS ONLY  START TAKING COLCHICINE 0.6 MG BY MOUTH TWICE DAILY FOR 3 MONTHS ONLY     Follow-Up:  WITH DR Meda Coffee ON February 05, 2018---WE WILL CALL YOU WITH THE APPOINTMENT TIME WHEN THIS SCHEDULE IS OPEN     If you need a refill on your cardiac medications before your next appointment, please call your pharmacy.

## 2018-01-18 NOTE — Progress Notes (Signed)
Cardiology Office Note    Date:  01/20/2018   ID:  Brandi Bryant, DOB 12-31-1955, MRN [id number]  PCP:  Charlott Rakes, MD  Cardiologist:  Ena Dawley, MD   Chief complain: chest pain  History of Present Illness:  Brandi Bryant is a 62 y.o. 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 She was on adjuvant hormonal therapy with tamoxifen started in August 2010. In March 2014: her treatment is switched to Femara. Her most recent CAT scan showed no evidence of metastatic disease. The same CT chest showed calcifications of her coronary vessels including left main and LAD.  The patient has been experiencing exertional dyspnea after walking a flight of stairs, that eventually progresses to chest pain on almost any exertion. She underwent a cardiac cath in Dec 2014 that showed mod non-obstructive CAD in the LAD/D1, D2, normal LVEF 60-65%. She was started on low dose atorvastatin considering her fatty lipids  11/02/16 - 6 months follow up, the patient has been in her PCP office 2 weeks ago with complains of cough, chills and obtained antibiotics. She has also experience chest pain while taking a deep breath, coughing, but also walking and associated with DOE. Denies dizziness, palpitations or syncope. She has episodes of SOB at rest.   02/06/2017, the patient is coming after 3 months, at the last visit chief complain of atypical chest pain with some typical features and underwent exercise nuclear stress test that showed no prior scar and no ischemia, normal LVEF, it also showed hypertensive response to stress. Been asked she said she didn't take her blood pressure medications the day of her stress test. She continues to have chest  pains when changing positions in bed the ER around her sternum, she denies any heavy lifting or work. She denies any dizziness dyspnea on exertion, she is tolerating Lipitor well. She has been getting new prescription for GERD that is working.  01/18/2018 - the patient is coming complaining of chest pain while in beg and when lying on the left side, she has also noticed worsening SOB and PND. No palpitations or syncope. She has been experiencing headaches, all over her head, sometimes all day, they started about 3 weeks ago, there is some nausea. She recently went to the ER with subjective fevers, treated for hypertensive urgency and viral URI. She was not tested for flu.  Past Medical History:  Diagnosis Date  . Abdominal pain   . Arthralgia 08/13/2013  . Bell palsy 2006  . Bell's palsy   . Breast cancer ([place])    Breast - surgery, chemo and radiation  . Chest pain 09/30/2013  . Cholelithiasis   . Colon cancer screening 10/27/2015  . Complication of anesthesia    " tubal ligation, in [place]"  had weakness, couldnt stand up the next day, was given pills for oxygen for Brain."  1 month after I was having loss of attentiviness, still have to focus  carefully.   . Coronary artery disease   . Depression   . Diabetes mellitus    Type II  . Dyspnea    at times- when air conditioner is runnimg, heat also   . Encounter for screening colonoscopy 10/30/2015  . Hepatic steatosis   . History of breast cancer 06/01/2012  . HTN (hypertension)  Past Surgical History:  Procedure Laterality Date  . ANKLE ARTHROSCOPY Right 12/14/2016   Procedure: Right Ankle Arthroscopic Debridement;  Surgeon: Newt Minion, MD;  Location: [facility];  Service: Orthopedics;  Laterality: Right;  . Arm surgery     Left tendon lengthen  . BREAST LUMPECTOMY Left 2010  . CHOLECYSTECTOMY N/A 12/09/2015   Procedure: LAPAROSCOPIC CHOLECYSTECTOMY WITH INTRAOPERATIVE CHOLANGIOGRAM;  Surgeon: Greer Pickerel, MD;  Location: [facility];   Service: General;  Laterality: N/A;  . LEFT HEART CATHETERIZATION WITH CORONARY ANGIOGRAM N/A 10/30/2013   Procedure: LEFT HEART CATHETERIZATION WITH CORONARY ANGIOGRAM;  Surgeon: Josue Hector, MD;  Location: [facility] CATH LAB;  Service: Cardiovascular;  Laterality: N/A;  . porta cath Right   . Removal of Porta cath Right   . TUBAL LIGATION      Current Medications: Outpatient Medications Prior to Visit  Medication Sig Dispense Refill  . albuterol (PROVENTIL HFA;VENTOLIN HFA) 108 (90 Base) MCG/ACT inhaler Inhale 1-2 puffs into the lungs every 6 (six) hours as needed for wheezing or shortness of breath. 54 g 3  . amLODipine (NORVASC) 5 MG tablet Take 1 tablet (5 mg total) by mouth daily. 90 tablet 0  . aspirin 81 MG tablet Take 81 mg by mouth daily.    Marland Kitchen atorvastatin (LIPITOR) 40 MG tablet Take 1 tablet (40 mg total) by mouth daily. 30 tablet 6  . calcium carbonate (TUMS - DOSED IN MG ELEMENTAL CALCIUM) 500 MG chewable tablet Chew 1 tablet by mouth daily. Reported on 05/19/2016    . carvedilol (COREG) 12.5 MG tablet TAKE 1 TABLET BY MOUTH 2 TIMES DAILY WITH A MEAL. 60 tablet 6  . cetirizine (ZYRTEC) 10 MG tablet Take 1 tablet (10 mg total) by mouth daily. 30 tablet 1  . ergocalciferol (DRISDOL) 50000 units capsule Take 1 capsule (50,000 Units total) by mouth once a week. 9 capsule 1  . fluticasone (FLONASE) 50 MCG/ACT nasal spray Place 2 sprays into both nostrils daily. 16 g 6  . gabapentin (NEURONTIN) 300 MG capsule Take 2 capsules (600 mg total) by mouth 2 (two) times daily. 60 capsule 6  . glipiZIDE (GLUCOTROL) 10 MG tablet TAKE 1 TABLET BY MOUTH 2 TIMES DAILY BEFORE A MEAL. 60 tablet 6  . ibuprofen (ADVIL,MOTRIN) 200 MG tablet Take 400-600 mg by mouth every 6 (six) hours as needed for mild pain.    . Insulin Glargine (LANTUS SOLOSTAR) 100 UNIT/ML Solostar Pen Inject 18 Units into the skin 2 (two) times daily. 15 mL 3  . Insulin Pen Needle (B-D ULTRAFINE III SHORT PEN) 31G X 8 MM MISC Use for  nightly insulin injections 100 each 11  . lactulose, encephalopathy, (CHRONULAC) 10 GM/15ML SOLN TAKE 15 MLS BY MOUTH 3 TIMES DAILY. 946 mL 0  . losartan (COZAAR) 100 MG tablet Take 1 tablet (100 mg total) by mouth daily. 30 tablet 6  . pantoprazole (PROTONIX) 40 MG tablet Take 1 tablet (40 mg total) by mouth daily. 30 tablet 6  . traZODone (DESYREL) 50 MG tablet Take 1 tablet (50 mg total) by mouth at bedtime as needed for sleep. 30 tablet 6   Facility-Administered Medications Prior to Visit  Medication Dose Route Frequency Provider Last Rate Last Dose  . aspirin tablet 325 mg  325 mg Oral Daily Chari Manning A, NP   325 mg at 09/02/15 7564     Allergies:   Gadolinium derivatives   Social History   Socioeconomic History  . Marital status: Single  Spouse name: None  . Number of children: None  . Years of education: None  . Highest education level: None  Social Needs  . Financial resource strain: None  . Food insecurity - worry: None  . Food insecurity - inability: None  . Transportation needs - medical: None  . Transportation needs - non-medical: None  Occupational History  . None  Tobacco Use  . Smoking status: Never Smoker  . Smokeless tobacco: Never Used  Substance and Sexual Activity  . Alcohol use: No  . Drug use: No  . Sexual activity: Not Currently    Birth control/protection: Surgical  Other Topics Concern  . None  Social History Narrative  . None     Family History:  The patient's family history includes Diabetes in her mother; Hypertension in her mother; Migraines in her daughter; Thyroid cancer in her sister.   ROS:   Please see the history of present illness.    ROS All other systems reviewed and are negative.   PHYSICAL EXAM:   VS:  BP 120/70 (BP Location: Left Arm, Patient Position: Sitting, Cuff Size: Normal)   Pulse 61   Ht 5' (1.524 m)   Wt 155 lb (70.3 kg)   SpO2 97%   BMI 30.27 kg/m    GEN: Well nourished, well developed, in no acute  distress  HEENT: normal  Neck: no JVD, carotid bruits, or masses Cardiac: RRR; no murmurs, rubs, or gallops,no edema  Respiratory:  clear to auscultation bilaterally, normal work of breathing GI: soft, nontender, nondistended, + BS MS: no deformity or atrophy  Skin: warm and dry, no rash Neuro:  Alert and Oriented x 3, Strength and sensation are intact Psych: euthymic mood, full affect  Wt Readings from Last 3 Encounters:  01/18/18 155 lb (70.3 kg)  01/08/18 159 lb (72.1 kg)  11/27/17 159 lb 3.2 oz (72.2 kg)      Studies/Labs Reviewed:   EKG:  EKG is ordered today.  The ekg ordered today demonstrates SR, normal ECG  Recent Labs: 02/01/2017: TSH 1.79 08/11/2017: B Natriuretic Peptide 48.3; Platelets 167 11/28/2017: ALT 27 01/08/2018: BUN 9; Creatinine, Ser 0.70; Hemoglobin 14.3; Potassium 4.1; Sodium 143   Lipid Panel    Component Value Date/Time   CHOL 130 11/28/2017 0937   TRIG 170 (H) 11/28/2017 0937   HDL 40 11/28/2017 0937   CHOLHDL 3.3 11/28/2017 0937   CHOLHDL 3.2 12/13/2016 0841   VLDL 26 04/18/2014 0855   LDLCALC 56 11/28/2017 0937    Nuclear stress test: 11/11/2016  Nuclear stress EF: 67%.  Blood pressure demonstrated a normal response to exercise.  There was no ST segment deviation noted during stress.  The study is normal.  This is a low risk study.  The left ventricular ejection fraction is hyperdynamic (>65%).   ASSESSMENT:    1. Acute pericarditis, unspecified type   2. Essential hypertension   3. Hyperlipidemia, unspecified hyperlipidemia type   4. Acute on chronic diastolic CHF (congestive heart failure), NYHA class 3 (HCC)      PLAN:  In order of problems listed above:  1. Chest pain - atypical, highly suspicious for an acute pericarditis after an URI, Start Colchicine 0.6 mg PO BID  2. SOB post URI, she is using Albuterol , appears to be in mild acute diastolic CHF, start lasix 20 mg po daily.  3. Headaches - possibly sec to  hypertension, if ongoing, consider head CT and neurology referral   4. Palpitations - improved after  starting carvedilol, 48 hour Holter monitor showed only few PVCs.  5. Hypertension - controlled, hypertensive response to exercise during stress test however she hadn't taken her medication that day.  6. Hyperlipidemia - continue atorvastatin 10 mg po daily, she has no side effects lipids are at goal.  7. Diabetic neuropathy - improved with Lyrica 50 mg twice a day  Follow up in 3 months.  Medication Adjustments/Labs and Tests Ordered: Current medicines are reviewed at length with the patient today.  Concerns regarding medicines are outlined above.  Medication changes, Labs and Tests ordered today are listed in the Patient Instructions below. Patient Instructions  Medication Instructions:   START TAKING LASIX 20 MG ONCE DAILY FOR 7 DAYS ONLY  START TAKING COLCHICINE 0.6 MG BY MOUTH TWICE DAILY FOR 3 MONTHS ONLY     Follow-Up:  WITH DR Meda Coffee ON February 05, 2018---WE WILL CALL YOU WITH THE APPOINTMENT TIME WHEN THIS SCHEDULE IS OPEN     If you need a refill on your cardiac medications before your next appointment, please call your pharmacy.      Signed, Ena Dawley, MD  01/20/2018 9:28 PM    [facility] [address], [place], [place]  [address] Phone: [phone]; Fax: [phone]

## 2018-01-25 ENCOUNTER — Emergency Department (HOSPITAL_COMMUNITY)
Admission: EM | Admit: 2018-01-25 | Discharge: 2018-01-26 | Disposition: A | Discharge status: Home / Self Care | Payer: No Typology Code available for payment source | Attending: Physician Assistant | Admitting: Physician Assistant

## 2018-01-25 ENCOUNTER — Ambulatory Visit
Admission: RE | Admit: 2018-01-25 | Discharge: 2018-01-25 | Disposition: A | Discharge status: Home / Self Care | Payer: No Typology Code available for payment source | Source: Ambulatory Visit | Attending: Obstetrics and Gynecology | Admitting: Obstetrics and Gynecology

## 2018-01-25 ENCOUNTER — Other Ambulatory Visit: Payer: Self-pay

## 2018-01-25 ENCOUNTER — Encounter (HOSPITAL_COMMUNITY): Payer: Self-pay

## 2018-01-25 ENCOUNTER — Ambulatory Visit (HOSPITAL_COMMUNITY)
Admission: RE | Admit: 2018-01-25 | Discharge: 2018-01-25 | Disposition: A | Discharge status: Home / Self Care | Payer: Self-pay | Source: Ambulatory Visit | Attending: Obstetrics and Gynecology | Admitting: Obstetrics and Gynecology

## 2018-01-25 ENCOUNTER — Emergency Department (HOSPITAL_COMMUNITY): Payer: No Typology Code available for payment source

## 2018-01-25 ENCOUNTER — Encounter (HOSPITAL_COMMUNITY): Payer: Self-pay | Admitting: Emergency Medicine

## 2018-01-25 VITALS — BP 154/72 | Ht 60.25 in | Wt 154.2 lb

## 2018-01-25 DIAGNOSIS — R51 Headache: Secondary | ICD-10-CM | POA: Insufficient documentation

## 2018-01-25 DIAGNOSIS — N6310 Unspecified lump in the right breast, unspecified quadrant: Secondary | ICD-10-CM

## 2018-01-25 DIAGNOSIS — E119 Type 2 diabetes mellitus without complications: Secondary | ICD-10-CM | POA: Insufficient documentation

## 2018-01-25 DIAGNOSIS — G8929 Other chronic pain: Secondary | ICD-10-CM

## 2018-01-25 DIAGNOSIS — I10 Essential (primary) hypertension: Secondary | ICD-10-CM | POA: Insufficient documentation

## 2018-01-25 DIAGNOSIS — Z79899 Other long term (current) drug therapy: Secondary | ICD-10-CM | POA: Insufficient documentation

## 2018-01-25 DIAGNOSIS — Z853 Personal history of malignant neoplasm of breast: Secondary | ICD-10-CM | POA: Insufficient documentation

## 2018-01-25 DIAGNOSIS — Z794 Long term (current) use of insulin: Secondary | ICD-10-CM | POA: Insufficient documentation

## 2018-01-25 DIAGNOSIS — Z1239 Encounter for other screening for malignant neoplasm of breast: Secondary | ICD-10-CM

## 2018-01-25 DIAGNOSIS — N644 Mastodynia: Secondary | ICD-10-CM

## 2018-01-25 DIAGNOSIS — I2510 Atherosclerotic heart disease of native coronary artery without angina pectoris: Secondary | ICD-10-CM | POA: Insufficient documentation

## 2018-01-25 DIAGNOSIS — Z7982 Long term (current) use of aspirin: Secondary | ICD-10-CM | POA: Insufficient documentation

## 2018-01-25 HISTORY — DX: Personal history of irradiation: Z92.3

## 2018-01-25 HISTORY — DX: Personal history of antineoplastic chemotherapy: Z92.21

## 2018-01-25 LAB — CBC WITH DIFFERENTIAL/PLATELET
BASOS ABS: 0 10*3/uL (ref 0.0–0.1)
BASOS PCT: 0 %
EOS ABS: 0.4 10*3/uL (ref 0.0–0.7)
EOS PCT: 5 %
HCT: 40.9 % (ref 36.0–46.0)
Hemoglobin: 14.2 g/dL (ref 12.0–15.0)
Lymphocytes Relative: 38 %
Lymphs Abs: 2.6 10*3/uL (ref 0.7–4.0)
MCH: 30.8 pg (ref 26.0–34.0)
MCHC: 34.7 g/dL (ref 30.0–36.0)
MCV: 88.7 fL (ref 78.0–100.0)
Monocytes Absolute: 0.6 10*3/uL (ref 0.1–1.0)
Monocytes Relative: 8 %
Neutro Abs: 3.3 10*3/uL (ref 1.7–7.7)
Neutrophils Relative %: 49 %
PLATELETS: 179 10*3/uL (ref 150–400)
RBC: 4.61 MIL/uL (ref 3.87–5.11)
RDW: 13.2 % (ref 11.5–15.5)
WBC: 6.8 10*3/uL (ref 4.0–10.5)

## 2018-01-25 LAB — BASIC METABOLIC PANEL
ANION GAP: 10 (ref 5–15)
BUN: 10 mg/dL (ref 6–20)
CHLORIDE: 105 mmol/L (ref 101–111)
CO2: 25 mmol/L (ref 22–32)
Calcium: 9.4 mg/dL (ref 8.9–10.3)
Creatinine, Ser: 0.74 mg/dL (ref 0.44–1.00)
Glucose, Bld: 215 mg/dL — ABNORMAL HIGH (ref 65–99)
POTASSIUM: 3.5 mmol/L (ref 3.5–5.1)
SODIUM: 140 mmol/L (ref 135–145)

## 2018-01-25 MED ORDER — DEXAMETHASONE SODIUM PHOSPHATE 4 MG/ML IJ SOLN
10.0000 mg | Freq: Once | INTRAMUSCULAR | Status: AC
  Administered 2018-01-25: 12 mg via INTRAVENOUS
  Filled 2018-01-25: qty 3

## 2018-01-25 MED ORDER — DIPHENHYDRAMINE HCL 50 MG/ML IJ SOLN
25.0000 mg | Freq: Once | INTRAMUSCULAR | Status: AC
  Administered 2018-01-25: 50 mg via INTRAVENOUS
  Filled 2018-01-25: qty 1

## 2018-01-25 MED ORDER — METOCLOPRAMIDE HCL 5 MG/ML IJ SOLN
10.0000 mg | Freq: Once | INTRAMUSCULAR | Status: AC
  Administered 2018-01-25: 10 mg via INTRAVENOUS
  Filled 2018-01-25: qty 2

## 2018-01-25 MED ORDER — KETOROLAC TROMETHAMINE 15 MG/ML IJ SOLN
15.0000 mg | Freq: Once | INTRAMUSCULAR | Status: AC
  Administered 2018-01-25: 15 mg via INTRAVENOUS
  Filled 2018-01-25: qty 1

## 2018-01-25 MED ORDER — SODIUM CHLORIDE 0.9 % IV SOLN
INTRAVENOUS | Status: DC
  Administered 2018-01-25: 21:00:00 via INTRAVENOUS

## 2018-01-25 NOTE — Discharge Instructions (Signed)
Please schedule an appointment with your regular doctor for follow-up of your ER visit today and for further headache management.  Your blood sugar was elevated in the ER today (215.)  Please inform your regular doctor for further diabetes management.  You can take 600 mg ibuprofen every 6 hours for headache.  Return to the emergency department if you have headache with vomiting that will not stop, headache with fever greater than 100.4 F, headache with neck stiffness or have any new or concerning symptoms.

## 2018-01-25 NOTE — ED Triage Notes (Addendum)
Pt to ED with c/o headache x's 3 weeks.  St's worse in past 2 days.  Pt denies nausea or vomiting.  Pt st's she fell 4 weeks ago and hit her head when she fell has been having headaches since then

## 2018-01-25 NOTE — ED Provider Notes (Addendum)
Patient placed in Quick Look pathway, seen and evaluated   Chief Complaint: headache  HPI:   62 y.o. female presents to the ED with severe headache that has gotten progressively worse over the past 2 days. Patient denies n/v. Patient does report a fall that occurred 4 weeks ago when she slipped and fell at the [employer] where she works and hit the back of her head. The headache she is having now she reports is on both sides of her head.   ROS: HENT: headache  Physical Exam:   Gen: No distress, appears extremely uncomfortable   Neuro: Awake and Alert, ambulatory without difficulty, grips are equal  Skin: Warm and dry      Focused Exam:    Initiation of care has begun. The patient has been counseled on the process, plan, and necessity for staying for the completion/evaluation, and the remainder of the medical screening examination    Ashley Murrain, NP 01/25/18 2054    Debroah Baller [place], NP 01/25/18 2056    Duffy Bruce, MD 01/26/18 [phone]

## 2018-01-25 NOTE — Progress Notes (Signed)
Complaints of right upper inner quadrant pain when touching breast. Patient stated the pain started around one month ago with a lump that has since resolved. Patient rates the pain at a 6 out of 10.  Pap Smear: Pap smear not completed today. Last Pap smear was 03/06/2014 at [facility] and normal with negative HPV. Per patient has no history of an abnormal Pap smear. Last Pap smear result is in Epic.  Physical exam: Breasts Right breast larger than left breast due to patients history of a left breast lumpectomy 04/21/2009. No skin abnormalities bilateral breasts. No nipple retraction bilateral breasts. No nipple discharge bilateral breasts. No lymphadenopathy. No lumps palpated bilateral breasts. Complaints of right breast pain at 1 o'clock on exam. Referred patient to the [facility] for diagnostic mammogram and possible right breast ultrasound. Appointment scheduled for Thursday, January 25, 2018 at 1420.      Pelvic/Bimanual No Pap smear completed today since last Pap smear and HPV typing was 03/06/2014. Pap smear not indicated per BCCCP guidelines.   Smoking History: Patient has never smoked.  Patient Navigation: Patient education provided. Access to services provided for patient through [facility] program. Spanish interpreter provided.   Colorectal Cancer Screening: Per patient has never had a colonoscopy completed. No complaints today. FIT Test given to patient to complete and return to [facility]CP.  Breast and Cervical Cancer Risk Assessment: Patient has no family history of breast cancer, known genetic mutations, or radiation treatment to the chest before age 74. Patient has a personal history of breast cancer in 2010. Patient has no history of cervical dysplasia, immunocompromised, or DES exposure in-utero.  Used Spanish interpreter [employer] from [place].

## 2018-01-25 NOTE — Patient Instructions (Signed)
Explained breast self awareness with Brandi Bryant. Patient did not need a Pap smear today due to last Pap smear and HPV typing was 03/06/2014. Let her know BCCCP will cover Pap smears and HPV typing every 5 years unless has a history of abnormal Pap smears. Referred patient to the [facility] for diagnostic mammogram and possible right breast ultrasound. Appointment scheduled for Thursday, January 25, 2018 at 1420. Brandi Bryant verbalized understanding.  Amilah Greenspan, Arvil Chaco, RN 2:39 PM

## 2018-01-25 NOTE — ED Provider Notes (Signed)
[facility] EMERGENCY DEPARTMENT Provider Note   CSN: [id number] Arrival date & time: 01/25/18  1949     History   Chief Complaint Chief Complaint  Patient presents with  . Headache    HPI Brandi Bryant is a 62 y.o. female.  HPI   Brandi Bryant is a 62yo female with a history of left breast cancer (s/p chemotherapy and lumpectomy 2009), type 2 diabetes, coronary artery disease, hypertension, depression who presents to the emergency department for evaluation of headache.  Patient states that she has had a persistent headache for 3 weeks now, seems to be worse over the past 2 days.  Reports that her headache is bitemporal and radiates to the top of her head.  Pain is 10/10 in severity and it described as "my head is about to explode."  She has tried taking ibuprofen and Tylenol without significant relief.  Reports that she has had headaches in the past, but this seems to be worse.  Noise and head movement seems to make her pain worse.  She denies fever, chills, visual loss, numbness, weakness, neck pain/stiffness, chest pain, shortness of breath, abdominal pain, lightheadedness.  Past Medical History:  Diagnosis Date  . Abdominal pain   . Arthralgia 08/13/2013  . Bell palsy 2006  . Bell's palsy   . Breast cancer ([place]) 2009   Left Breast Cancer  . Chest pain 09/30/2013  . Cholelithiasis   . Colon cancer screening 10/27/2015  . Complication of anesthesia    " tubal ligation, in [place]"  had weakness, couldnt stand up the next day, was given pills for oxygen for Brain."  1 month after I was having loss of attentiviness, still have to focus  carefully.   . Coronary artery disease   . Depression   . Diabetes mellitus    Type II  . Dyspnea    at times- when air conditioner is runnimg, heat also   . Encounter for screening colonoscopy 10/30/2015  . Hepatic steatosis   . History of breast cancer 06/01/2012  . HTN (hypertension)   . Personal  history of chemotherapy 2009   Left Breast Cancer  . Personal history of radiation therapy 2009   Left Breast Cancer    Patient Active Problem List   Diagnosis Date Noted  . GERD (gastroesophageal reflux disease) 11/27/2017  . Chronic sinusitis 05/16/2017  . Diabetic polyneuropathy associated with type 2 diabetes mellitus ([place]) 10/31/2016  . Impingement syndrome of right ankle 10/31/2016  . Symptomatic cholelithiasis 12/09/2015  . Encounter for screening colonoscopy 10/30/2015  . Colon cancer screening 10/27/2015  . Bronchitis, acute 09/08/2014  . Palpitations 08/28/2014  . Preventive measure 08/21/2014  . Intractable headache 02/21/2014  . TIA (transient ischemic attack) 02/20/2014  . Trigeminal neuralgia 01/22/2014  . Weakness 01/22/2014  . Bell's palsy 01/21/2014  . UTI (urinary tract infection) 01/21/2014  . Hepatic steatosis 01/21/2014  . Headache 01/20/2014  . Diabetes type 2, uncontrolled ([place]) 01/20/2014  . Facial droop 01/15/2014  . Stroke ([place]) 01/15/2014  . Type 2 diabetes mellitus with diabetic neuropathy ([place]) 12/06/2013  . Hyperlipidemia 11/07/2013  . CAD (coronary artery disease), native coronary artery 11/07/2013  . Chest pain 09/30/2013  . Arthralgia 08/13/2013  . History of breast cancer 06/01/2012  . Abdominal pain 01/31/2012  . Nausea & vomiting 01/31/2012  . HTN (hypertension)   . Diabetes mellitus ([place])     Past Surgical History:  Procedure Laterality Date  . ANKLE ARTHROSCOPY Right 12/14/2016   Procedure:  Right Ankle Arthroscopic Debridement;  Surgeon: Newt Minion, MD;  Location: [facility];  Service: Orthopedics;  Laterality: Right;  . Arm surgery     Left tendon lengthen  . BREAST LUMPECTOMY Left 2009  . CHOLECYSTECTOMY N/A 12/09/2015   Procedure: LAPAROSCOPIC CHOLECYSTECTOMY WITH INTRAOPERATIVE CHOLANGIOGRAM;  Surgeon: Greer Pickerel, MD;  Location: [facility];  Service: General;  Laterality: N/A;  . LEFT HEART CATHETERIZATION WITH CORONARY ANGIOGRAM N/A  10/30/2013   Procedure: LEFT HEART CATHETERIZATION WITH CORONARY ANGIOGRAM;  Surgeon: Josue Hector, MD;  Location: [facility] CATH LAB;  Service: Cardiovascular;  Laterality: N/A;  . porta cath Right   . Removal of Porta cath Right   . TUBAL LIGATION      OB History    Gravida Para Term Preterm AB Living   6 6 6     6    SAB TAB Ectopic Multiple Live Births                   Home Medications    Prior to Admission medications   Medication Sig Start Date End Date Taking? Authorizing Provider  albuterol (PROVENTIL HFA;VENTOLIN HFA) 108 (90 Base) MCG/ACT inhaler Inhale 1-2 puffs into the lungs every 6 (six) hours as needed for wheezing or shortness of breath. 04/25/17   Charlott Rakes, MD  amLODipine (NORVASC) 5 MG tablet Take 1 tablet (5 mg total) by mouth daily. 01/08/18   Jaynee Eagles, PA-C  aspirin 81 MG tablet Take 81 mg by mouth daily.    [provider]  atorvastatin (LIPITOR) 40 MG tablet Take 1 tablet (40 mg total) by mouth daily. 11/27/17   Charlott Rakes, MD  calcium carbonate (TUMS - DOSED IN MG ELEMENTAL CALCIUM) 500 MG chewable tablet Chew 1 tablet by mouth daily. Reported on 05/19/2016    [provider]  carvedilol (COREG) 12.5 MG tablet TAKE 1 TABLET BY MOUTH 2 TIMES DAILY WITH A MEAL. 11/27/17   Charlott Rakes, MD  cetirizine (ZYRTEC) 10 MG tablet Take 1 tablet (10 mg total) by mouth daily. 05/16/17   Charlott Rakes, MD  colchicine 0.6 MG tablet Take 1 tablet (0.6 mg total) by mouth 2 (two) times daily. Take for 3 months only. Patient not taking: Reported on 01/25/2018 01/18/18   Dorothy Spark, MD  ergocalciferol (DRISDOL) 50000 units capsule Take 1 capsule (50,000 Units total) by mouth once a week. Patient not taking: Reported on 01/25/2018 05/17/17   Charlott Rakes, MD  fluticasone (FLONASE) 50 MCG/ACT nasal spray Place 2 sprays into both nostrils daily. 05/16/17   Charlott Rakes, MD  furosemide (LASIX) 20 MG tablet Take 1 tablet (20 mg total) by mouth daily. Take  for one week only. Patient not taking: Reported on 01/25/2018 01/18/18 04/18/18  Dorothy Spark, MD  gabapentin (NEURONTIN) 300 MG capsule Take 2 capsules (600 mg total) by mouth 2 (two) times daily. 11/27/17   Charlott Rakes, MD  glipiZIDE (GLUCOTROL) 10 MG tablet TAKE 1 TABLET BY MOUTH 2 TIMES DAILY BEFORE A MEAL. 11/27/17   Charlott Rakes, MD  ibuprofen (ADVIL,MOTRIN) 200 MG tablet Take 400-600 mg by mouth every 6 (six) hours as needed for mild pain.    [provider]  Insulin Glargine (LANTUS SOLOSTAR) 100 UNIT/ML Solostar Pen Inject 18 Units into the skin 2 (two) times daily. 11/27/17   Charlott Rakes, MD  Insulin Pen Needle (B-D ULTRAFINE III SHORT PEN) 31G X 8 MM MISC Use for nightly insulin injections 02/01/17   Charlott Rakes, MD  lactulose, encephalopathy, (CHRONULAC) 10 GM/15ML SOLN TAKE 15 MLS BY MOUTH 3 TIMES DAILY. Patient not taking: Reported on 01/25/2018 02/13/17   Charlott Rakes, MD  losartan (COZAAR) 100 MG tablet Take 1 tablet (100 mg total) by mouth daily. Patient not taking: Reported on 01/25/2018 11/27/17   Charlott Rakes, MD  pantoprazole (PROTONIX) 40 MG tablet Take 1 tablet (40 mg total) by mouth daily. 11/27/17   Charlott Rakes, MD  traZODone (DESYREL) 50 MG tablet Take 1 tablet (50 mg total) by mouth at bedtime as needed for sleep. 05/16/17   Charlott Rakes, MD  escitalopram (LEXAPRO) 10 MG tablet Take 10 mg by mouth daily.  01/31/12  [provider]    Family History Family History  Problem Relation Age of Onset  . Thyroid cancer Sister   . Hypertension Mother   . Diabetes Mother   . Migraines Daughter     Social History Social History   Tobacco Use  . Smoking status: Never Smoker  . Smokeless tobacco: Never Used  Substance Use Topics  . Alcohol use: No  . Drug use: No     Allergies   Gadolinium derivatives   Review of Systems Review of Systems   Physical Exam Updated Vital Signs BP (!) 142/80 (BP Location: Right Arm)   Pulse 83    Temp 98.5 F (36.9 C) (Oral)   Resp 18   Ht 5' (1.524 m)   Wt 69.9 kg (154 lb)   SpO2 95%   BMI 30.08 kg/m   Physical Exam  Constitutional: She is oriented to person, place, and time. She appears well-developed and well-nourished. No distress.  Sitting at bedside in no apparent distress.  Nontoxic.  HENT:  Head: Normocephalic and atraumatic.  Mouth/Throat: Oropharynx is clear and moist. No oropharyngeal exudate.  No tenderness or erythema over the temporal arteries.   Eyes: Conjunctivae and EOM are normal. Pupils are equal, round, and reactive to light. Right eye exhibits no discharge. Left eye exhibits no discharge.  Neck: Normal range of motion. Neck supple.  Negative Brudzinski sign.  Cardiovascular: Normal rate, regular rhythm and intact distal pulses.  Pulmonary/Chest: Effort normal and breath sounds normal. No stridor. No respiratory distress. She has no wheezes. She has no rales.  Abdominal: Soft. Bowel sounds are normal. There is no tenderness. There is no guarding.  Musculoskeletal: Normal range of motion.  Mental Status:  Alert, oriented, thought content appropriate, able to give a coherent history. Speech fluent without evidence of aphasia. Able to follow 2 step commands without difficulty.  Cranial Nerves:  II:  Peripheral visual fields grossly normal, pupils equal, round, reactive to light III,IV, VI: ptosis not present, extra-ocular motions intact bilaterally  V,VII: smile symmetric, facial light touch sensation equal VIII: hearing grossly normal to voice  X: uvula elevates symmetrically  XI: bilateral shoulder shrug symmetric and strong XII: midline tongue extension without fassiculations Motor:  Normal tone. 5/5 in upper and lower extremities bilaterally including strong and equal grip strength and dorsiflexion/plantar flexion Sensory: Pinprick and light touch normal in all extremities.  Deep Tendon Reflexes: 2+ and symmetric in the biceps and patella Cerebellar:  normal finger-to-nose with bilateral upper extremities CV: distal pulses palpable throughout   Lymphadenopathy:    She has no cervical adenopathy.  Neurological: She is alert and oriented to person, place, and time. Coordination normal.  Skin: Skin is warm and dry. She is not diaphoretic.  Psychiatric: She has a normal mood and affect. Her behavior is normal.  Nursing  note and vitals reviewed.    ED Treatments / Results  Labs (all labs ordered are listed, but only abnormal results are displayed) Labs Reviewed  CBC WITH DIFFERENTIAL/PLATELET  BASIC METABOLIC PANEL    EKG  EKG Interpretation None       Radiology Ct Head Wo Contrast  Result Date: 01/25/2018 CLINICAL DATA:  Three-week history of headaches, acutely worsened in the past 2 days. Patient states that she fell and struck her head approximately 4 weeks ago. Initial encounter. EXAM: CT HEAD WITHOUT CONTRAST TECHNIQUE: Contiguous axial images were obtained from the base of the skull through the vertex without intravenous contrast. COMPARISON:  CT head 02/20/2014, 01/20/2014, 01/15/2014. MRI brain 07/14/2015, 02/21/2014. FINDINGS: Brain: Ventricular system normal in size and appearance for age. Remote left occipital lobe cortical stroke, unchanged. Mild changes of small vessel disease of the white matter, unchanged. No mass lesion. No midline shift. No acute hemorrhage or hematoma. No extra-axial fluid collections. No evidence of acute infarction. Vascular: Severe bilateral carotid siphon and moderate bilateral vertebrobasilar atherosclerosis. No hyperdense vessel. Skull: No skull fracture or other focal osseous abnormality involving the skull. Sinuses/Orbits: Visualized paranasal sinuses, bilateral mastoid air cells and bilateral middle ear cavities well-aerated. Visualized orbits and globes normal in appearance. Other: None. IMPRESSION: 1. No acute intracranial abnormality. 2. Stable remote left occipital lobe stroke. Stable mild  chronic microvascular ischemic changes of the white matter. Electronically Signed   By: Evangeline Dakin M.D.   On: 01/25/2018 21:35   US Breast Ltd Uni Right Inc Axilla  Result Date: 01/25/2018 CLINICAL DATA:  Patient describes a recent right breast lump, no longer felt by the patient.However, patient now feels focal pain within this same area of the inner right breast. History of left breast cancer status post lumpectomy in 2009. EXAM: DIGITAL DIAGNOSTIC BILATERAL MAMMOGRAM WITH CAD AND TOMO ULTRASOUND RIGHT BREAST COMPARISON:  Previous exam(s). ACR Breast Density Category b: There are scattered areas of fibroglandular density. FINDINGS: There are no new dominant masses, suspicious calcifications or secondary signs of malignancy within either breast. Specifically, there is no mammographic abnormality within the inner right breast corresponding to the area of clinical concern. There are stable postsurgical changes within the left breast Mammographic images were processed with CAD. Targeted ultrasound is performed, evaluating the outer right breast as directed by the patient, showing only normal fibroglandular tissues and fat lobules throughout. No solid or cystic mass. No skin thickening. IMPRESSION: No evidence of malignancy within either breast. RECOMMENDATION: 1.  Screening mammogram in one year.(Code:SM-B-01Y) 2. The patient was instructed to return sooner if the lump that she recently felt returns, or if a new palpable abnormality is identified in either breast. I have discussed the findings and recommendations with the patient. Results were also provided in writing at the conclusion of the visit. If applicable, a reminder letter will be sent to the patient regarding the next appointment. BI-RADS CATEGORY  2: Benign. Electronically Signed   By: Franki Cabot M.D.   On: 01/25/2018 15:39   Mm Diag Breast Tomo Bilateral  Result Date: 01/25/2018 CLINICAL DATA:  Patient describes a recent right breast lump,  no longer felt by the patient.However, patient now feels focal pain within this same area of the inner right breast. History of left breast cancer status post lumpectomy in 2009. EXAM: DIGITAL DIAGNOSTIC BILATERAL MAMMOGRAM WITH CAD AND TOMO ULTRASOUND RIGHT BREAST COMPARISON:  Previous exam(s). ACR Breast Density Category b: There are scattered areas of fibroglandular density. FINDINGS: There are no  new dominant masses, suspicious calcifications or secondary signs of malignancy within either breast. Specifically, there is no mammographic abnormality within the inner right breast corresponding to the area of clinical concern. There are stable postsurgical changes within the left breast Mammographic images were processed with CAD. Targeted ultrasound is performed, evaluating the outer right breast as directed by the patient, showing only normal fibroglandular tissues and fat lobules throughout. No solid or cystic mass. No skin thickening. IMPRESSION: No evidence of malignancy within either breast. RECOMMENDATION: 1.  Screening mammogram in one year.(Code:SM-B-01Y) 2. The patient was instructed to return sooner if the lump that she recently felt returns, or if a new palpable abnormality is identified in either breast. I have discussed the findings and recommendations with the patient. Results were also provided in writing at the conclusion of the visit. If applicable, a reminder letter will be sent to the patient regarding the next appointment. BI-RADS CATEGORY  2: Benign. Electronically Signed   By: Franki Cabot M.D.   On: 01/25/2018 15:39    Procedures Procedures (including critical care time)  Medications Ordered in ED Medications  0.9 %  sodium chloride infusion ( Intravenous New Bag/Given 01/25/18 2101)  dexamethasone (DECADRON) injection 10 mg (12 mg Intravenous Given 01/25/18 2102)  diphenhydrAMINE (BENADRYL) injection 25 mg (50 mg Intravenous Given 01/25/18 2109)  metoCLOPramide (REGLAN) injection 10 mg  (10 mg Intravenous Given 01/25/18 2108)  ketorolac (TORADOL) 15 MG/ML injection 15 mg (15 mg Intravenous Given 01/25/18 2218)     Initial Impression / Assessment and Plan / ED Course  I have reviewed the triage vital signs and the nursing notes.  Pertinent labs & imaging results that were available during my care of the patient were reviewed by me and considered in my medical decision making (see chart for details).    Presents with headache for three weeks. Has a history of breast cancer. Presentation is non concerning for Meningitis, or temporal arteritis. Pt is afebrile with no focal neuro deficits, nuchal rigidity, or change in vision. CT scan of the head without acute abnormality. CBC WNL. BMP shows elevated glucose (bg 215, patient known diabetic.) HA treated and improved while in ED.  Pt is to follow up with PCP to discuss prophylactic medication and elevated glucose.  Counseled patient on return precautions. Pt verbalizes understanding and is agreeable with plan to dc.   Final Clinical Impressions(s) / ED Diagnoses   Final diagnoses:  Chronic nonintractable headache, unspecified headache type    ED Discharge Orders    None       Glyn Ade, PA-C 01/25/18 2350    Macarthur Critchley, MD 01/26/18 0011

## 2018-01-26 ENCOUNTER — Inpatient Hospital Stay: Payer: Self-pay | Attending: Hematology and Oncology | Admitting: Hematology and Oncology

## 2018-01-26 ENCOUNTER — Encounter: Payer: Self-pay | Admitting: Hematology and Oncology

## 2018-01-26 ENCOUNTER — Telehealth: Payer: Self-pay | Admitting: Hematology and Oncology

## 2018-01-26 ENCOUNTER — Other Ambulatory Visit: Payer: Self-pay

## 2018-01-26 ENCOUNTER — Encounter (HOSPITAL_COMMUNITY): Payer: Self-pay | Admitting: *Deleted

## 2018-01-26 DIAGNOSIS — Z79899 Other long term (current) drug therapy: Secondary | ICD-10-CM | POA: Insufficient documentation

## 2018-01-26 DIAGNOSIS — Z7982 Long term (current) use of aspirin: Secondary | ICD-10-CM | POA: Insufficient documentation

## 2018-01-26 DIAGNOSIS — R519 Headache, unspecified: Secondary | ICD-10-CM

## 2018-01-26 DIAGNOSIS — Z853 Personal history of malignant neoplasm of breast: Secondary | ICD-10-CM | POA: Insufficient documentation

## 2018-01-26 DIAGNOSIS — R51 Headache: Secondary | ICD-10-CM | POA: Insufficient documentation

## 2018-01-26 DIAGNOSIS — N644 Mastodynia: Secondary | ICD-10-CM | POA: Insufficient documentation

## 2018-01-26 DIAGNOSIS — Z794 Long term (current) use of insulin: Secondary | ICD-10-CM | POA: Insufficient documentation

## 2018-01-26 NOTE — Progress Notes (Signed)
[facility] OFFICE PROGRESS NOTE  Patient Care Team: Charlott Rakes, MD as PCP - General (Family Medicine) Heath Lark, MD as Consulting Physician (Hematology and Oncology) Dorothy Spark, MD as Consulting Physician (Cardiology)  ASSESSMENT & PLAN:  History of breast cancer She has completed 5 years of adjuvant hormonal therapy. I recommend yearly visit with history, physical examination and mammogram. I have reviewed recent mammogram.  Recent examination did not suggest cancer recurrence She is reminded to continue screening mammogram yearly and I plan to see her back next year   Intractable headache She has chronic intractable headaches Her most recent CT head was negative for malignancy We discussed conservative over-the-counter management I recommend she consult with her primary care doctor for further management   Orders Placed This Encounter  Procedures  . MM DIAG BREAST TOMO BILATERAL    Standing Status:   Future    Standing Expiration Date:   01/27/2019    Order Specific Question:   Reason for Exam (SYMPTOM  OR DIAGNOSIS REQUIRED)    Answer:   Hx lumpectomy    Order Specific Question:   Is the patient pregnant?    Answer:   No    Order Specific Question:   Preferred imaging location?    Answer:   [facility]    INTERVAL HISTORY: Please see below for problem oriented charting. 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 She had neoadjuvant chemotherapy with Cytoxan and Taxotere is status post lumpectomy with sentinel lymph  node on 04/21/2009. She received adjuvant radiation therapy. She was on adjuvant hormonal therapy with tamoxifen started in August 2010. In March 2014: her treatment is switched to Femara. Femara is discontinued on 08/21/2014.  REVIEW OF SYSTEMS:   Constitutional: Denies fevers, chills or abnormal weight loss Eyes: Denies blurriness of vision Ears, nose, mouth, throat, and face: Denies mucositis or sore throat Respiratory: Denies cough, dyspnea or wheezes Cardiovascular: Denies palpitation, chest discomfort or lower extremity swelling Gastrointestinal:  Denies nausea, heartburn or change in bowel habits Skin: Denies abnormal skin rashes Lymphatics: Denies new lymphadenopathy or easy bruising Neurological:Denies numbness, tingling or new weaknesses Behavioral/Psych: Mood is stable, no new changes  All other systems were reviewed with the patient and are negative.  I have reviewed the past medical history, past surgical history, social history and family history with the patient and they are unchanged from previous note.  ALLERGIES:  is allergic to gadolinium derivatives.  MEDICATIONS:  Current Outpatient Medications  Medication Sig Dispense Refill  . albuterol (PROVENTIL HFA;VENTOLIN HFA) 108 (90 Base) MCG/ACT inhaler Inhale 1-2 puffs into the lungs every 6 (six) hours as needed for wheezing or shortness of breath. 54 g 3  . amLODipine (NORVASC) 5 MG tablet Take 1 tablet (5 mg total) by mouth daily. 90 tablet 0  . aspirin 81 MG tablet Take 81 mg by mouth daily.    Marland Kitchen atorvastatin (LIPITOR) 40 MG tablet Take 1 tablet (40 mg total) by mouth daily. 30 tablet 6  . calcium carbonate (TUMS - DOSED IN MG ELEMENTAL CALCIUM) 500 MG chewable tablet Chew 1 tablet  by mouth daily. Reported on 05/19/2016    . carvedilol (COREG) 12.5 MG tablet TAKE 1 TABLET BY MOUTH 2 TIMES DAILY WITH A MEAL. 60 tablet 6  . cetirizine (ZYRTEC) 10 MG tablet Take 1 tablet (10 mg total) by mouth daily. 30 tablet 1  .  colchicine 0.6 MG tablet Take 1 tablet (0.6 mg total) by mouth 2 (two) times daily. Take for 3 months only. (Patient not taking: Reported on 01/25/2018) 180 tablet 0  . ergocalciferol (DRISDOL) 50000 units capsule Take 1 capsule (50,000 Units total) by mouth once a week. (Patient not taking: Reported on 01/25/2018) 9 capsule 1  . fluticasone (FLONASE) 50 MCG/ACT nasal spray Place 2 sprays into both nostrils daily. 16 g 6  . furosemide (LASIX) 20 MG tablet Take 1 tablet (20 mg total) by mouth daily. Take for one week only. (Patient not taking: Reported on 01/25/2018) 7 tablet 0  . gabapentin (NEURONTIN) 300 MG capsule Take 2 capsules (600 mg total) by mouth 2 (two) times daily. 60 capsule 6  . glipiZIDE (GLUCOTROL) 10 MG tablet TAKE 1 TABLET BY MOUTH 2 TIMES DAILY BEFORE A MEAL. 60 tablet 6  . ibuprofen (ADVIL,MOTRIN) 200 MG tablet Take 400-600 mg by mouth every 6 (six) hours as needed for mild pain.    . Insulin Glargine (LANTUS SOLOSTAR) 100 UNIT/ML Solostar Pen Inject 18 Units into the skin 2 (two) times daily. 15 mL 3  . Insulin Pen Needle (B-D ULTRAFINE III SHORT PEN) 31G X 8 MM MISC Use for nightly insulin injections 100 each 11  . lactulose, encephalopathy, (CHRONULAC) 10 GM/15ML SOLN TAKE 15 MLS BY MOUTH 3 TIMES DAILY. (Patient not taking: Reported on 01/25/2018) 946 mL 0  . losartan (COZAAR) 100 MG tablet Take 1 tablet (100 mg total) by mouth daily. (Patient not taking: Reported on 01/25/2018) 30 tablet 6  . pantoprazole (PROTONIX) 40 MG tablet Take 1 tablet (40 mg total) by mouth daily. 30 tablet 6  . traZODone (DESYREL) 50 MG tablet Take 1 tablet (50 mg total) by mouth at bedtime as needed for sleep. 30 tablet 6   Current Facility-Administered Medications  Medication Dose Route Frequency Provider Last Rate Last Dose  . aspirin tablet 325 mg  325 mg Oral Daily Chari Manning A, NP   325 mg at 09/02/15 1628    PHYSICAL EXAMINATION: ECOG PERFORMANCE STATUS: 1 - Symptomatic but completely  ambulatory  Vitals:   01/26/18 1256  BP: 127/68  Pulse: 78  Resp: 18  Temp: 98.3 F (36.8 C)  SpO2: 97%   Filed Weights   01/26/18 1256  Weight: 155 lb 3.2 oz (70.4 kg)    GENERAL:alert, no distress and comfortable SKIN: skin color, texture, turgor are normal, no rashes or significant lesions EYES: normal, Conjunctiva are pink and non-injected, sclera clear OROPHARYNX:no exudate, no erythema and lips, buccal mucosa, and tongue normal  NECK: supple, thyroid normal size, non-tender, without nodularity LYMPH:  no palpable lymphadenopathy in the cervical, axillary or inguinal LUNGS: clear to auscultation and percussion with normal breathing effort HEART: regular rate & rhythm and no murmurs and no lower extremity edema ABDOMEN:abdomen soft, non-tender and normal bowel sounds Musculoskeletal:no cyanosis of digits and no clubbing  NEURO: alert & oriented x 3 with fluent speech, no focal motor/sensory deficits Bilateral breast exam were performed.  Well-healed lumpectomy scar on the left without any abnormalities except for post surgical scar On the right breast, fibrocystic breast changes are noted.  No other abnormalities  LABORATORY DATA:  I have reviewed the data as listed    Component Value Date/Time   NA 140 01/25/2018 2057   NA 138 11/28/2017 0937   NA 139 09/30/2013 1033   K 3.5 01/25/2018 2057   K 4.2 09/30/2013 1033   CL 105 01/25/2018 2057   CL 107 01/28/2013 0854   CO2 25 01/25/2018 2057   CO2 22 09/30/2013 1033   GLUCOSE 215 (H) 01/25/2018 2057   GLUCOSE 202 (H) 04/18/2014 0853   GLUCOSE 243 (H) 01/28/2013 0854   BUN 10 01/25/2018 2057   BUN 15 11/28/2017 0937   BUN 13.0 09/30/2013 1033   CREATININE 0.74 01/25/2018 2057   CREATININE 0.68 12/13/2016 0841   CREATININE 0.8 09/30/2013 1033   CALCIUM 9.4 01/25/2018 2057   CALCIUM 9.9 09/30/2013 1033   PROT 7.6 11/28/2017 0937   PROT 7.5 09/30/2013 1033   ALBUMIN 4.0 11/28/2017 0937   ALBUMIN 3.7 09/30/2013  1033   AST 34 11/28/2017 0937   AST 50 (H) 09/30/2013 1033   ALT 27 11/28/2017 0937   ALT 45 09/30/2013 1033   ALKPHOS 119 (H) 11/28/2017 0937   ALKPHOS 108 09/30/2013 1033   BILITOT 0.9 11/28/2017 0937   BILITOT 0.89 09/30/2013 1033   GFRNONAA >60 01/25/2018 2057   GFRNONAA >89 12/13/2016 0841   GFRAA >60 01/25/2018 2057   GFRAA >89 12/13/2016 0841    No results found for: SPEP, UPEP  Lab Results  Component Value Date   WBC 6.8 01/25/2018   NEUTROABS 3.3 01/25/2018   HGB 14.2 01/25/2018   HCT 40.9 01/25/2018   MCV 88.7 01/25/2018   PLT 179 01/25/2018      Chemistry      Component Value Date/Time   NA 140 01/25/2018 2057   NA 138 11/28/2017 0937   NA 139 09/30/2013 1033   K 3.5 01/25/2018 2057   K 4.2 09/30/2013 1033   CL 105 01/25/2018 2057   CL 107 01/28/2013 0854   CO2 25 01/25/2018 2057   CO2 22 09/30/2013 1033   BUN 10 01/25/2018 2057   BUN 15 11/28/2017 0937   BUN 13.0 09/30/2013 1033   CREATININE 0.74 01/25/2018 2057   CREATININE 0.68 12/13/2016 0841   CREATININE 0.8 09/30/2013 1033   GLU 569 (HH) 01/26/2009 1508      Component Value Date/Time   CALCIUM 9.4 01/25/2018 2057   CALCIUM 9.9 09/30/2013 1033   ALKPHOS 119 (H) 11/28/2017 0937   ALKPHOS 108 09/30/2013 1033   AST 34 11/28/2017 0937   AST 50 (H) 09/30/2013 1033   ALT 27 11/28/2017 0937   ALT 45 09/30/2013 1033   BILITOT 0.9 11/28/2017 0937   BILITOT 0.89 09/30/2013 1033       RADIOGRAPHIC STUDIES: I have personally reviewed the radiological images as listed and agreed with the findings in the report. Ct Head Wo Contrast  Result Date: 01/25/2018 CLINICAL DATA:  Three-week history of headaches, acutely worsened in the past 2 days. Patient states that she fell and struck her head approximately 4 weeks ago. Initial encounter. EXAM: CT HEAD WITHOUT CONTRAST TECHNIQUE: Contiguous axial images were obtained from the base of the skull through the vertex without intravenous contrast. COMPARISON:   CT head 02/20/2014, 01/20/2014, 01/15/2014. MRI brain 07/14/2015, 02/21/2014. FINDINGS: Brain: Ventricular system normal in size and appearance for age. Remote left occipital lobe cortical stroke, unchanged. Mild changes of small vessel disease of the white matter, unchanged. No mass lesion. No midline shift. No acute hemorrhage or hematoma. No  extra-axial fluid collections. No evidence of acute infarction. Vascular: Severe bilateral carotid siphon and moderate bilateral vertebrobasilar atherosclerosis. No hyperdense vessel. Skull: No skull fracture or other focal osseous abnormality involving the skull. Sinuses/Orbits: Visualized paranasal sinuses, bilateral mastoid air cells and bilateral middle ear cavities well-aerated. Visualized orbits and globes normal in appearance. Other: None. IMPRESSION: 1. No acute intracranial abnormality. 2. Stable remote left occipital lobe stroke. Stable mild chronic microvascular ischemic changes of the white matter. Electronically Signed   By: Evangeline Dakin M.D.   On: 01/25/2018 21:35   US Breast Ltd Uni Right Inc Axilla  Result Date: 01/25/2018 CLINICAL DATA:  Patient describes a recent right breast lump, no longer felt by the patient.However, patient now feels focal pain within this same area of the inner right breast. History of left breast cancer status post lumpectomy in 2009. EXAM: DIGITAL DIAGNOSTIC BILATERAL MAMMOGRAM WITH CAD AND TOMO ULTRASOUND RIGHT BREAST COMPARISON:  Previous exam(s). ACR Breast Density Category b: There are scattered areas of fibroglandular density. FINDINGS: There are no new dominant masses, suspicious calcifications or secondary signs of malignancy within either breast. Specifically, there is no mammographic abnormality within the inner right breast corresponding to the area of clinical concern. There are stable postsurgical changes within the left breast Mammographic images were processed with CAD. Targeted ultrasound is performed, evaluating  the outer right breast as directed by the patient, showing only normal fibroglandular tissues and fat lobules throughout. No solid or cystic mass. No skin thickening. IMPRESSION: No evidence of malignancy within either breast. RECOMMENDATION: 1.  Screening mammogram in one year.(Code:SM-B-01Y) 2. The patient was instructed to return sooner if the lump that she recently felt returns, or if a new palpable abnormality is identified in either breast. I have discussed the findings and recommendations with the patient. Results were also provided in writing at the conclusion of the visit. If applicable, a reminder letter will be sent to the patient regarding the next appointment. BI-RADS CATEGORY  2: Benign. Electronically Signed   By: Franki Cabot M.D.   On: 01/25/2018 15:39   Mm Diag Breast Tomo Bilateral  Result Date: 01/25/2018 CLINICAL DATA:  Patient describes a recent right breast lump, no longer felt by the patient.However, patient now feels focal pain within this same area of the inner right breast. History of left breast cancer status post lumpectomy in 2009. EXAM: DIGITAL DIAGNOSTIC BILATERAL MAMMOGRAM WITH CAD AND TOMO ULTRASOUND RIGHT BREAST COMPARISON:  Previous exam(s). ACR Breast Density Category b: There are scattered areas of fibroglandular density. FINDINGS: There are no new dominant masses, suspicious calcifications or secondary signs of malignancy within either breast. Specifically, there is no mammographic abnormality within the inner right breast corresponding to the area of clinical concern. There are stable postsurgical changes within the left breast Mammographic images were processed with CAD. Targeted ultrasound is performed, evaluating the outer right breast as directed by the patient, showing only normal fibroglandular tissues and fat lobules throughout. No solid or cystic mass. No skin thickening. IMPRESSION: No evidence of malignancy within either breast. RECOMMENDATION: 1.  Screening  mammogram in one year.(Code:SM-B-01Y) 2. The patient was instructed to return sooner if the lump that she recently felt returns, or if a new palpable abnormality is identified in either breast. I have discussed the findings and recommendations with the patient. Results were also provided in writing at the conclusion of the visit. If applicable, a reminder letter will be sent to the patient regarding the next appointment. BI-RADS CATEGORY  2:  Benign. Electronically Signed   By: Franki Cabot M.D.   On: 01/25/2018 15:39    All questions were answered. The patient knows to call the clinic with any problems, questions or concerns. No barriers to learning was detected.  I spent 15 minutes counseling the patient face to face. The total time spent in the appointment was 20 minutes and more than 50% was on counseling and review of test results  Heath Lark, MD 01/26/2018 4:11 PM

## 2018-01-26 NOTE — Assessment & Plan Note (Signed)
She has completed 5 years of adjuvant hormonal therapy. I recommend yearly visit with history, physical examination and mammogram. I have reviewed recent mammogram.  Recent examination did not suggest cancer recurrence She is reminded to continue screening mammogram yearly and I plan to see her back next year

## 2018-01-26 NOTE — Telephone Encounter (Signed)
Gave avs and calendar ° °

## 2018-01-26 NOTE — Assessment & Plan Note (Signed)
She has chronic intractable headaches Her most recent CT head was negative for malignancy We discussed conservative over-the-counter management I recommend she consult with her primary care doctor for further management

## 2018-01-29 ENCOUNTER — Ambulatory Visit: Payer: Self-pay | Attending: Family Medicine

## 2018-01-31 ENCOUNTER — Other Ambulatory Visit: Payer: Self-pay

## 2018-01-31 ENCOUNTER — Ambulatory Visit: Payer: Self-pay | Attending: Family Medicine | Admitting: Physician Assistant

## 2018-01-31 VITALS — BP 137/82 | HR 56 | Temp 98.5°F | Resp 18 | Ht 60.0 in | Wt 151.0 lb

## 2018-01-31 DIAGNOSIS — Z79899 Other long term (current) drug therapy: Secondary | ICD-10-CM | POA: Insufficient documentation

## 2018-01-31 DIAGNOSIS — Z853 Personal history of malignant neoplasm of breast: Secondary | ICD-10-CM | POA: Insufficient documentation

## 2018-01-31 DIAGNOSIS — Z791 Long term (current) use of non-steroidal anti-inflammatories (NSAID): Secondary | ICD-10-CM | POA: Insufficient documentation

## 2018-01-31 DIAGNOSIS — Z789 Other specified health status: Secondary | ICD-10-CM

## 2018-01-31 DIAGNOSIS — H04203 Unspecified epiphora, bilateral lacrimal glands: Secondary | ICD-10-CM

## 2018-01-31 DIAGNOSIS — M62838 Other muscle spasm: Secondary | ICD-10-CM

## 2018-01-31 DIAGNOSIS — Z794 Long term (current) use of insulin: Secondary | ICD-10-CM | POA: Insufficient documentation

## 2018-01-31 DIAGNOSIS — E119 Type 2 diabetes mellitus without complications: Secondary | ICD-10-CM | POA: Insufficient documentation

## 2018-01-31 DIAGNOSIS — Z888 Allergy status to other drugs, medicaments and biological substances status: Secondary | ICD-10-CM | POA: Insufficient documentation

## 2018-01-31 DIAGNOSIS — I2510 Atherosclerotic heart disease of native coronary artery without angina pectoris: Secondary | ICD-10-CM | POA: Insufficient documentation

## 2018-01-31 DIAGNOSIS — E870 Hyperosmolality and hypernatremia: Secondary | ICD-10-CM | POA: Insufficient documentation

## 2018-01-31 DIAGNOSIS — Z603 Acculturation difficulty: Secondary | ICD-10-CM

## 2018-01-31 DIAGNOSIS — I10 Essential (primary) hypertension: Secondary | ICD-10-CM

## 2018-01-31 DIAGNOSIS — G44229 Chronic tension-type headache, not intractable: Secondary | ICD-10-CM

## 2018-01-31 DIAGNOSIS — Z7982 Long term (current) use of aspirin: Secondary | ICD-10-CM | POA: Insufficient documentation

## 2018-01-31 DIAGNOSIS — F329 Major depressive disorder, single episode, unspecified: Secondary | ICD-10-CM | POA: Insufficient documentation

## 2018-01-31 DIAGNOSIS — H5789 Other specified disorders of eye and adnexa: Secondary | ICD-10-CM | POA: Insufficient documentation

## 2018-01-31 DIAGNOSIS — E0800 Diabetes mellitus due to underlying condition with hyperosmolarity without nonketotic hyperglycemic-hyperosmolar coma (NKHHC): Secondary | ICD-10-CM

## 2018-01-31 LAB — GLUCOSE, POCT (MANUAL RESULT ENTRY): POC Glucose: 222 mg/dl — AB (ref 70–99)

## 2018-01-31 MED ORDER — MELOXICAM 7.5 MG PO TABS: 7.5000 mg | ORAL_TABLET | Freq: Two times a day (BID) | ORAL | 0 refills | Status: DC

## 2018-01-31 MED ORDER — METHOCARBAMOL 500 MG PO TABS: 500.0000 mg | ORAL_TABLET | Freq: Four times a day (QID) | ORAL | 0 refills | Status: DC | PRN

## 2018-01-31 MED FILL — METHOCARBAMOL 500 MG TABLET: 500 | 22 days supply | Qty: 90 | Fill #0

## 2018-01-31 MED FILL — MELOXICAM 7.5 MG TABLET: 7.5 | 30 days supply | Qty: 60 | Fill #0

## 2018-01-31 NOTE — Patient Instructions (Signed)
Cefalea tensional (Tension Headache) Una cefalea tensional es un dolor o presin que se siente en la frente y los lados de la cabeza. Estas cefaleas pueden durar de 30minutos a varios das. CUIDADOS EN EL HOGAR Control del dolor  Tome los medicamentos de venta libre y los recetados solamente como se lo haya indicado el mdico.  Cuando sienta dolor de cabeza acustese en un cuarto oscuro y tranquilo.  Si se lo indican, aplquese hielo en la zona de la cabeza y el cuello: ? Ponga el hielo en una bolsa plstica. ? Coloque una toalla entre la piel y la bolsa de hielo. ? Coloque el hielo durante 20minutos, 2 a 3veces por da.  Utilice una almohadilla trmica o tome una ducha caliente para aplicar calor en la zona de la cabeza y el cuello segn las indicaciones del mdico. Comida y bebida  Mantenga un horario para las comidas.  No beba mucho alcohol.  No consuma mucha cafena ni deje de consumirla por completo. Instrucciones generales  Concurra a todas las visitas de control como se lo haya indicado el mdico. Esto es importante.  Lleve un registro diario para averiguar si ciertas cosas provocan los dolores de cabeza. Por ejemplo, escriba los siguientes datos: ? Lo que usted come y bebe. ? Cunto tiempo duerme. ? Algn cambio en su dieta o en los medicamentos.  Pruebe recibir un masaje o hacer otras cosas que lo ayuden a relajarse.  Disminuya el nivel de estrs.  Sintese con la espalda recta. No contraiga (tensione) los msculos.  No consuma productos que contengan tabaco. Estos incluyen cigarrillos, tabaco para mascar y cigarrillos electrnicos. Si necesita ayuda para dejar de fumar, consulte al mdico.  Haga ejercicios con regularidad tal como se lo indic el mdico.  Duerma lo suficiente. Es decir, entre 7 y 9horas de sueo. SOLICITE AYUDA SI:  Los medicamentos no logran aliviar los sntomas.  Tiene un dolor de cabeza que es diferente del dolor de cabeza  habitual.  Tiene malestar estomacal (nuseas) o vomita.  Tiene fiebre. SOLICITE AYUDA DE INMEDIATO SI:  La cefalea es muy intensa.  Sigue vomitando.  Presenta rigidez en el cuello.  Tiene dificultad para ver.  Tiene dificultad para hablar.  Siente dolor en el ojo o en el odo.  Sus msculos estn dbiles, o pierde el control muscular.  Pierde el equilibrio o tiene problemas para caminar.  Siente que se desvanece (pierde el conocimiento) o se desmaya.  Se siente confundido. Esta informacin no tiene como fin reemplazar el consejo del mdico. Asegrese de hacerle al mdico cualquier pregunta que tenga. Document Released: 01/30/2012 Document Revised: 07/29/2015 Document Reviewed: 03/02/2015 Elsevier Interactive Patient Education  2018 [employer]  

## 2018-01-31 NOTE — Progress Notes (Signed)
Patient ID: Brandi Bryant, female   DOB: February 22, 1956, 62 y.o.   MRN: [id number]     Brandi Bryant, is a 62 y.o. female  GUR:[id number]  [id number]:[id number]  DOB - Dec 30, 1955  Subjective:  Chief Complaint and HPI: Brandi Bryant is a 62 y.o. female here today c/o headaches for about 5 weeks.  Starts in the back of her neck.  No vision changes.  Seems to be worse in the car or awakens with the headache.  CT was negative 01/25/2018.  Ibuprofen or tylenol help a little.  Wants to see an eye doctor due to increased tearing and needs reading glasses.  Headaches occurring almost daily.  No N/V/D.  No FH aneurysm.    [employer] interpreters  "Brandi Bryant"  Translating  Blood sugars running around 200.  Denies s/sx hyper/hypoglycemia.     ROS:   Constitutional:  No f/c, No night sweats, No unexplained weight loss. EENT:  No vision changes, + blurry vision with reading, No hearing changes. No mouth, throat, or ear problems.  Respiratory: No cough, No SOB Cardiac: No CP, no palpitations GI:  No abd pain, No N/V/D. GU: No Urinary s/sx Musculoskeletal: No joint pain Neuro: +headache, no dizziness, no motor weakness.  Skin: No rash Endocrine:  No polydipsia. No polyuria.  Psych: Denies SI/HI  No problems updated.  ALLERGIES: Allergies  Allergen Reactions  . Gadolinium Derivatives Nausea And Vomiting    Code: VOM, Desc: Pt began vomiting 45 sec after MRI contrast injection of Multihance, Onset Date: [id number]      PAST MEDICAL HISTORY: Past Medical History:  Diagnosis Date  . Abdominal pain   . Arthralgia 08/13/2013  . Bell palsy 2006  . Bell's palsy   . Breast cancer ([place]) 2009   Left Breast Cancer  . Chest pain 09/30/2013  . Cholelithiasis   . Colon cancer screening 10/27/2015  . Complication of anesthesia    " tubal ligation, in [place]"  had weakness, couldnt stand up the next day, was given pills for oxygen for Brain."  1 month after I was having loss of  attentiviness, still have to focus  carefully.   . Coronary artery disease   . Depression   . Diabetes mellitus    Type II  . Dyspnea    at times- when air conditioner is runnimg, heat also   . Encounter for screening colonoscopy 10/30/2015  . Hepatic steatosis   . History of breast cancer 06/01/2012  . HTN (hypertension)   . Personal history of chemotherapy 2009   Left Breast Cancer  . Personal history of radiation therapy 2009   Left Breast Cancer    MEDICATIONS AT HOME: Prior to Admission medications   Medication Sig Start Date End Date Taking? Authorizing Provider  albuterol (PROVENTIL HFA;VENTOLIN HFA) 108 (90 Base) MCG/ACT inhaler Inhale 1-2 puffs into the lungs every 6 (six) hours as needed for wheezing or shortness of breath. 04/25/17  Yes Brandi Rakes, MD  amLODipine (NORVASC) 5 MG tablet Take 1 tablet (5 mg total) by mouth daily. 01/08/18  Yes Brandi Eagles, Bryant  aspirin 81 MG tablet Take 81 mg by mouth daily.   Yes [provider]  atorvastatin (LIPITOR) 40 MG tablet Take 1 tablet (40 mg total) by mouth daily. 11/27/17  Yes Brandi Rakes, MD  calcium carbonate (TUMS - DOSED IN MG ELEMENTAL CALCIUM) 500 MG chewable tablet Chew 1 tablet by mouth daily. Reported on 05/19/2016   Yes [provider]  carvedilol (COREG) 12.5 MG tablet TAKE  1 TABLET BY MOUTH 2 TIMES DAILY WITH A MEAL. 11/27/17  Yes Brandi Rakes, MD  cetirizine (ZYRTEC) 10 MG tablet Take 1 tablet (10 mg total) by mouth daily. 05/16/17  Yes Brandi Bryant, Charlane Ferretti, MD  fluticasone (FLONASE) 50 MCG/ACT nasal spray Place 2 sprays into both nostrils daily. 05/16/17  Yes Brandi Rakes, MD  gabapentin (NEURONTIN) 300 MG capsule Take 2 capsules (600 mg total) by mouth 2 (two) times daily. 11/27/17  Yes Brandi Bryant, Enobong, MD  glipiZIDE (GLUCOTROL) 10 MG tablet TAKE 1 TABLET BY MOUTH 2 TIMES DAILY BEFORE A MEAL. 11/27/17  Yes Brandi Rakes, MD  Insulin Glargine (LANTUS SOLOSTAR) 100 UNIT/ML Solostar Pen Inject 18 Units  into the skin 2 (two) times daily. 11/27/17  Yes Brandi Bryant, Charlane Ferretti, MD  Insulin Pen Needle (B-D ULTRAFINE III SHORT PEN) 31G X 8 MM MISC Use for nightly insulin injections 02/01/17  Yes Brandi Bryant, Enobong, MD  pantoprazole (PROTONIX) 40 MG tablet Take 1 tablet (40 mg total) by mouth daily. 11/27/17  Yes Brandi Rakes, MD  traZODone (DESYREL) 50 MG tablet Take 1 tablet (50 mg total) by mouth at bedtime as needed for sleep. 05/16/17  Yes Brandi Rakes, MD  colchicine 0.6 MG tablet Take 1 tablet (0.6 mg total) by mouth 2 (two) times daily. Take for 3 months only. Patient not taking: Reported on 01/25/2018 01/18/18   Brandi Spark, MD  furosemide (LASIX) 20 MG tablet Take 1 tablet (20 mg total) by mouth daily. Take for one week only. Patient not taking: Reported on 01/25/2018 01/18/18 04/18/18  Brandi Spark, MD  lactulose, encephalopathy, (CHRONULAC) 10 GM/15ML SOLN TAKE 15 MLS BY MOUTH 3 TIMES DAILY. Patient not taking: Reported on 01/25/2018 02/13/17   Brandi Rakes, MD  losartan (COZAAR) 100 MG tablet Take 1 tablet (100 mg total) by mouth daily. Patient not taking: Reported on 01/25/2018 11/27/17   Brandi Rakes, MD  meloxicam (MOBIC) 7.5 MG tablet Take 1 tablet (7.5 mg total) by mouth 2 (two) times daily. X 5 days then prn 01/31/18   Brandi Bryant  methocarbamol (ROBAXIN) 500 MG tablet Take 1 tablet (500 mg total) by mouth every 6 (six) hours as needed for muscle spasms. And headache X5 days then prn 01/31/18   Brandi Bryant  escitalopram (LEXAPRO) 10 MG tablet Take 10 mg by mouth daily.  01/31/12  [provider]     Objective:  EXAM:   Vitals:   01/31/18 1348  BP: 137/82  Pulse: (!) 56  Resp: 18  Temp: 98.5 F (36.9 C)  TempSrc: Oral  SpO2: 96%  Weight: 151 lb (68.5 kg)  Height: 5' (1.524 m)    General appearance : A&OX3. NAD. Non-toxic-appearing HEENT: Atraumatic and Normocephalic.  PERRLA. EOM intact.  TM full B. Mouth-MMM, post pharynx WNL w/o erythema, No  PND. Neck: supple, no JVD. No cervical lymphadenopathy. No thyromegaly Chest/Lungs:  Breathing-non-labored, Good air entry bilaterally, breath sounds normal without rales, rhonchi, or wheezing  CVS: S1 S2 regular, no murmurs, gallops, rubs  ++TTP spasm in B trapezius Extremities: Bilateral Lower Ext shows no edema, both legs are warm to touch with = pulse throughout Neurology:  CN II-XII grossly intact, Non focal.  Normal finger to nose/heel to shin/negative rhomberg Psych:  TP linear. J/I WNL. Normal speech. Appropriate eye contact and affect.  Skin:  No Rash  Data Review Lab Results  Component Value Date   HGBA1C 7.9 11/27/2017   HGBA1C 8.3 05/16/2017   HGBA1C 8.2 12/06/2016  Assessment & Plan   1. Diabetes mellitus due to underlying condition with hyperosmolarity without coma, without long-term current use of insulin (HCC) Uncontrolled.  Improve on diet and take all meds as directed - Glucose (CBG)  2. Essential hypertension At goal-continue current regimen  3. Language barrier [employer] interpreters used and additional time performing visit was required.  4. Chronic tension-type headache, not intractable - meloxicam (MOBIC) 7.5 MG tablet; Take 1 tablet (7.5 mg total) by mouth 2 (two) times daily. X 5 days then prn  Dispense: 60 tablet; Refill: 0  5. Muscle spasms of trapezius in neck B - methocarbamol (ROBAXIN) 500 MG tablet; Take 1 tablet (500 mg total) by mouth every 6 (six) hours as needed for muscle spasms. And headache X5 days then prn  Dispense: 90 tablet; Refill: 0  6. Excessive tear production of both lacrimal glands - Ambulatory referral to Ophthalmology  Patient have been counseled extensively about nutrition and exercise  Return for keep 4/12 appt.  The patient was given clear instructions to go to ER or return to medical center if symptoms don't improve, worsen or new problems develop. The patient verbalized understanding. The patient was told to call to  get lab results if they haven't heard anything in the next week.     Freeman Caldron, [facility] and [place], [place]   01/31/2018, 2:15 PM

## 2018-02-06 ENCOUNTER — Encounter (HOSPITAL_COMMUNITY): Payer: Self-pay

## 2018-02-06 LAB — FECAL OCCULT BLOOD, IMMUNOCHEMICAL: Fecal Occult Bld: NEGATIVE

## 2018-02-20 MED FILL — AMLODIPINE BESYLATE 5 MG TA: 5 | 30 days supply | Qty: 30 | Fill #2

## 2018-02-20 MED FILL — ATORVASTATIN 40 MG TABLET: 40 | 30 days supply | Qty: 30 | Fill #1

## 2018-02-20 MED FILL — glipiZIDE 10 MG TABS: 10 | 30 days supply | Qty: 60 | Fill #2

## 2018-02-20 MED FILL — GABAPENTIN 300 MG CAPSULE: 300 | 15 days supply | Qty: 60 | Fill #2

## 2018-02-20 MED FILL — PANTOPRAZOLE SOD DR 40 MG T: 40 | 30 days supply | Qty: 30 | Fill #2

## 2018-02-20 MED FILL — CARVEDILOL 12.5 MG TABLET: 12.5 | 30 days supply | Qty: 60 | Fill #2

## 2018-02-20 MED FILL — LOSARTAN POTASSIUM 100 MG T: 100 | 30 days supply | Qty: 30 | Fill #1

## 2018-02-20 MED FILL — $LANTUS SOLOSTAR 100 UNITS/: 100 | 25 days supply | Qty: 9 | Fill #2

## 2018-02-22 ENCOUNTER — Other Ambulatory Visit: Payer: Self-pay | Admitting: Family Medicine

## 2018-02-22 DIAGNOSIS — Z794 Long term (current) use of insulin: Principal | ICD-10-CM

## 2018-02-22 DIAGNOSIS — R6889 Other general symptoms and signs: Secondary | ICD-10-CM

## 2018-02-22 DIAGNOSIS — E1165 Type 2 diabetes mellitus with hyperglycemia: Principal | ICD-10-CM

## 2018-02-22 DIAGNOSIS — IMO0001 Reserved for inherently not codable concepts without codable children: Secondary | ICD-10-CM

## 2018-02-22 DIAGNOSIS — I10 Essential (primary) hypertension: Secondary | ICD-10-CM

## 2018-03-02 ENCOUNTER — Ambulatory Visit: Payer: Self-pay | Admitting: Family Medicine

## 2018-03-05 ENCOUNTER — Other Ambulatory Visit: Payer: Self-pay | Admitting: Family Medicine

## 2018-03-05 ENCOUNTER — Ambulatory Visit: Payer: Self-pay

## 2018-03-05 ENCOUNTER — Telehealth: Payer: Self-pay | Admitting: Family Medicine

## 2018-03-05 DIAGNOSIS — M79642 Pain in left hand: Secondary | ICD-10-CM

## 2018-03-05 DIAGNOSIS — M546 Pain in thoracic spine: Secondary | ICD-10-CM

## 2018-03-05 DIAGNOSIS — M25532 Pain in left wrist: Secondary | ICD-10-CM

## 2018-03-05 NOTE — Telephone Encounter (Signed)
Pt called to request an update on her  -Ophthalmology Referral  please follow up

## 2018-03-06 NOTE — Telephone Encounter (Signed)
Patient dont have insurance and our resources don't have an ophthalmology specialist. Mail a letter to patient with eye resources at low cost.

## 2018-03-19 ENCOUNTER — Other Ambulatory Visit: Payer: Self-pay | Admitting: Family Medicine

## 2018-03-19 ENCOUNTER — Ambulatory Visit: Payer: Self-pay

## 2018-03-19 DIAGNOSIS — M25532 Pain in left wrist: Secondary | ICD-10-CM

## 2018-03-23 MED FILL — LOSARTAN POTASSIUM 100 MG T: 100 | 30 days supply | Qty: 30 | Fill #2

## 2018-03-23 MED FILL — CARVEDILOL 12.5 MG TABLET: 12.5 | 30 days supply | Qty: 60 | Fill #3

## 2018-03-23 MED FILL — TRUEPLUS PEN NDL 31G X 1/4": 31G X 6 MM | 25 days supply | Qty: 100 | Fill #0

## 2018-03-23 MED FILL — ATORVASTATIN CALCIUM 40 MG: 40 | 30 days supply | Qty: 30 | Fill #2

## 2018-03-23 MED FILL — TRUEPLUS PEN NDL 31G X 1/4: 31G X 6 MM | 25 days supply | Qty: 100 | Fill #0

## 2018-03-23 MED FILL — $LANTUS SOLOSTAR 100 UNITS/: 100 | 25 days supply | Qty: 9 | Fill #3

## 2018-03-23 MED FILL — AMLODIPINE BESYLATE 5 MG TA: 5 | 30 days supply | Qty: 30 | Fill #3

## 2018-03-23 MED FILL — GABAPENTIN 300 MG CAPSULE: 300 | 15 days supply | Qty: 60 | Fill #3

## 2018-03-23 MED FILL — glipiZIDE 10 MG TABS: 10 | 30 days supply | Qty: 60 | Fill #3

## 2018-03-23 MED FILL — PANTOPRAZOLE SOD DR 40 MG T: 40 | 30 days supply | Qty: 30 | Fill #3

## 2018-03-30 ENCOUNTER — Emergency Department (HOSPITAL_COMMUNITY): Payer: Self-pay

## 2018-03-30 ENCOUNTER — Observation Stay (HOSPITAL_COMMUNITY): Payer: Self-pay

## 2018-03-30 ENCOUNTER — Other Ambulatory Visit: Payer: Self-pay

## 2018-03-30 ENCOUNTER — Observation Stay (HOSPITAL_COMMUNITY)
Admission: EM | Admit: 2018-03-30 | Discharge: 2018-03-31 | Disposition: A | Discharge status: Home / Self Care | Payer: Self-pay | Attending: Internal Medicine | Admitting: Internal Medicine

## 2018-03-30 ENCOUNTER — Encounter (HOSPITAL_COMMUNITY): Payer: Self-pay

## 2018-03-30 DIAGNOSIS — I639 Cerebral infarction, unspecified: Secondary | ICD-10-CM

## 2018-03-30 DIAGNOSIS — G459 Transient cerebral ischemic attack, unspecified: Principal | ICD-10-CM | POA: Diagnosis present

## 2018-03-30 DIAGNOSIS — R519 Headache, unspecified: Secondary | ICD-10-CM | POA: Diagnosis present

## 2018-03-30 DIAGNOSIS — I2510 Atherosclerotic heart disease of native coronary artery without angina pectoris: Secondary | ICD-10-CM | POA: Insufficient documentation

## 2018-03-30 DIAGNOSIS — Z8249 Family history of ischemic heart disease and other diseases of the circulatory system: Secondary | ICD-10-CM

## 2018-03-30 DIAGNOSIS — E119 Type 2 diabetes mellitus without complications: Secondary | ICD-10-CM

## 2018-03-30 DIAGNOSIS — Z833 Family history of diabetes mellitus: Secondary | ICD-10-CM

## 2018-03-30 DIAGNOSIS — G510 Bell's palsy: Secondary | ICD-10-CM | POA: Insufficient documentation

## 2018-03-30 DIAGNOSIS — F329 Major depressive disorder, single episode, unspecified: Secondary | ICD-10-CM | POA: Insufficient documentation

## 2018-03-30 DIAGNOSIS — I6523 Occlusion and stenosis of bilateral carotid arteries: Secondary | ICD-10-CM

## 2018-03-30 DIAGNOSIS — I69392 Facial weakness following cerebral infarction: Secondary | ICD-10-CM

## 2018-03-30 DIAGNOSIS — Z7982 Long term (current) use of aspirin: Secondary | ICD-10-CM | POA: Insufficient documentation

## 2018-03-30 DIAGNOSIS — R51 Headache: Secondary | ICD-10-CM | POA: Insufficient documentation

## 2018-03-30 DIAGNOSIS — E785 Hyperlipidemia, unspecified: Secondary | ICD-10-CM | POA: Diagnosis present

## 2018-03-30 DIAGNOSIS — Z8669 Personal history of other diseases of the nervous system and sense organs: Secondary | ICD-10-CM

## 2018-03-30 DIAGNOSIS — Z8673 Personal history of transient ischemic attack (TIA), and cerebral infarction without residual deficits: Secondary | ICD-10-CM | POA: Insufficient documentation

## 2018-03-30 DIAGNOSIS — R05 Cough: Secondary | ICD-10-CM

## 2018-03-30 DIAGNOSIS — K219 Gastro-esophageal reflux disease without esophagitis: Secondary | ICD-10-CM | POA: Insufficient documentation

## 2018-03-30 DIAGNOSIS — Z853 Personal history of malignant neoplasm of breast: Secondary | ICD-10-CM | POA: Insufficient documentation

## 2018-03-30 DIAGNOSIS — F449 Dissociative and conversion disorder, unspecified: Secondary | ICD-10-CM

## 2018-03-30 DIAGNOSIS — I10 Essential (primary) hypertension: Secondary | ICD-10-CM | POA: Diagnosis present

## 2018-03-30 DIAGNOSIS — Z91041 Radiographic dye allergy status: Secondary | ICD-10-CM

## 2018-03-30 DIAGNOSIS — Z794 Long term (current) use of insulin: Secondary | ICD-10-CM | POA: Insufficient documentation

## 2018-03-30 DIAGNOSIS — Z79899 Other long term (current) drug therapy: Secondary | ICD-10-CM | POA: Insufficient documentation

## 2018-03-30 HISTORY — DX: Cerebral infarction, unspecified: I63.9

## 2018-03-30 LAB — I-STAT CHEM 8, ED
BUN: 17 mg/dL (ref 6–20)
CHLORIDE: 107 mmol/L (ref 101–111)
CREATININE: 0.9 mg/dL (ref 0.44–1.00)
Calcium, Ion: 1.2 mmol/L (ref 1.15–1.40)
Glucose, Bld: 188 mg/dL — ABNORMAL HIGH (ref 65–99)
HEMATOCRIT: 39 % (ref 36.0–46.0)
Hemoglobin: 13.3 g/dL (ref 12.0–15.0)
POTASSIUM: 3.9 mmol/L (ref 3.5–5.1)
SODIUM: 143 mmol/L (ref 135–145)
TCO2: 24 mmol/L (ref 22–32)

## 2018-03-30 LAB — COMPREHENSIVE METABOLIC PANEL
ALBUMIN: 3.7 g/dL (ref 3.5–5.0)
ALK PHOS: 116 U/L (ref 38–126)
ALT: 25 U/L (ref 14–54)
ANION GAP: 7 (ref 5–15)
AST: 34 U/L (ref 15–41)
BUN: 16 mg/dL (ref 6–20)
CO2: 26 mmol/L (ref 22–32)
Calcium: 9.4 mg/dL (ref 8.9–10.3)
Chloride: 110 mmol/L (ref 101–111)
Creatinine, Ser: 0.93 mg/dL (ref 0.44–1.00)
GFR calc Af Amer: >60 mL/min (ref >60)
GFR calc non Af Amer: >60 mL/min (ref >60)
GLUCOSE: 190 mg/dL — AB (ref 65–99)
POTASSIUM: 3.9 mmol/L (ref 3.5–5.1)
SODIUM: 143 mmol/L (ref 135–145)
TOTAL PROTEIN: 7.1 g/dL (ref 6.5–8.1)
Total Bilirubin: 0.8 mg/dL (ref 0.3–1.2)

## 2018-03-30 LAB — CBC
HCT: 39.3 % (ref 36.0–46.0)
Hemoglobin: 13.6 g/dL (ref 12.0–15.0)
MCH: 30.5 pg (ref 26.0–34.0)
MCHC: 34.6 g/dL (ref 30.0–36.0)
MCV: 88.1 fL (ref 78.0–100.0)
PLATELETS: 167 10*3/uL (ref 150–400)
RBC: 4.46 MIL/uL (ref 3.87–5.11)
RDW: 12.5 % (ref 11.5–15.5)
WBC: 7.3 10*3/uL (ref 4.0–10.5)

## 2018-03-30 LAB — DIFFERENTIAL
Basophils Absolute: 0 10*3/uL (ref 0.0–0.1)
Basophils Relative: 0 %
EOS ABS: 0.6 10*3/uL (ref 0.0–0.7)
EOS PCT: 9 %
LYMPHS ABS: 2.7 10*3/uL (ref 0.7–4.0)
LYMPHS PCT: 37 %
Monocytes Absolute: 0.8 10*3/uL (ref 0.1–1.0)
Monocytes Relative: 11 %
Neutro Abs: 3.2 10*3/uL (ref 1.7–7.7)
Neutrophils Relative %: 43 %

## 2018-03-30 LAB — PROTIME-INR
INR: 1.1
PROTHROMBIN TIME: 14.1 s (ref 11.4–15.2)

## 2018-03-30 LAB — GLUCOSE, CAPILLARY: Glucose-Capillary: 78 mg/dL (ref 65–99)

## 2018-03-30 LAB — APTT: aPTT: 29 seconds (ref 24–36)

## 2018-03-30 LAB — I-STAT BETA HCG BLOOD, ED (MC, WL, AP ONLY): I-stat hCG, quantitative: <5 m[IU]/mL (ref <5)

## 2018-03-30 LAB — I-STAT TROPONIN, ED: Troponin i, poc: 0 ng/mL (ref 0.00–0.08)

## 2018-03-30 MED ORDER — ASPIRIN EC 325 MG PO TBEC
650.0000 mg | DELAYED_RELEASE_TABLET | Freq: Once | ORAL | Status: AC
  Administered 2018-03-30: 650 mg via ORAL
  Filled 2018-03-30: qty 2

## 2018-03-30 MED ORDER — ACETAMINOPHEN 650 MG RE SUPP: 650.0000 mg | RECTAL | Status: DC | PRN

## 2018-03-30 MED ORDER — ACETAMINOPHEN 160 MG/5ML PO SOLN: 650.0000 mg | ORAL | Status: DC | PRN

## 2018-03-30 MED ORDER — ACETAMINOPHEN 325 MG PO TABS
650.0000 mg | ORAL_TABLET | ORAL | Status: DC | PRN
  Administered 2018-03-30: 650 mg via ORAL
  Filled 2018-03-30: qty 2

## 2018-03-30 MED ORDER — ASPIRIN EC 325 MG PO TBEC
325.0000 mg | DELAYED_RELEASE_TABLET | Freq: Every day | ORAL | Status: DC
  Administered 2018-03-31: 325 mg via ORAL
  Filled 2018-03-30: qty 1

## 2018-03-30 MED ORDER — ENOXAPARIN SODIUM 40 MG/0.4ML ~~LOC~~ SOLN
40.0000 mg | SUBCUTANEOUS | Status: DC
Start: 2018-03-30 — End: 2018-03-31

## 2018-03-30 MED ORDER — INSULIN GLARGINE 100 UNIT/ML ~~LOC~~ SOLN
10.0000 [IU] | Freq: Two times a day (BID) | SUBCUTANEOUS | Status: DC
  Administered 2018-03-31 (×2): 10 [IU] via SUBCUTANEOUS
  Filled 2018-03-30 (×3): qty 0.1

## 2018-03-30 MED ORDER — INSULIN ASPART 100 UNIT/ML ~~LOC~~ SOLN: 0.0000 [IU] | Freq: Three times a day (TID) | SUBCUTANEOUS | Status: DC

## 2018-03-30 MED ORDER — STROKE: EARLY STAGES OF RECOVERY BOOK
Freq: Once | Status: DC
  Filled 2018-03-30: qty 1

## 2018-03-30 MED ORDER — ATORVASTATIN CALCIUM 40 MG PO TABS
40.0000 mg | ORAL_TABLET | Freq: Every day | ORAL | Status: DC
  Administered 2018-03-31: 40 mg via ORAL
  Filled 2018-03-30: qty 1

## 2018-03-30 NOTE — ED Notes (Signed)
Patient ambulated to restroom independently. Daughter standby assist. Gait slow, steady. No distress noted.

## 2018-03-30 NOTE — ED Notes (Addendum)
Patient passed swallow screen. Paged Dr. Lynnae January for diet order.

## 2018-03-30 NOTE — ED Notes (Signed)
Hospitalists at bedside at this time.  

## 2018-03-30 NOTE — ED Provider Notes (Signed)
[facility] EMERGENCY DEPARTMENT Provider Note   CSN: [id number] Arrival date & time: 03/30/18  1436     History   Chief Complaint Chief Complaint  Patient presents with  . Facial Droop  . Numbness    HPI Brandi Bryant is a 62 y.o. female.  HPI  The patient is a 62 year old female, she has a known history of Bell's palsy but also complains of a couple of episodes of stroke in the past.  She states that approximately 12:00 noon she developed acute onset of numbness of the left side of her face as well as noticing some numbness in her left arm and her left leg.  She felt like she might have some weakness in those areas as well.  The daughter who is here with her and is acting as an Education officer, community states that she has not noticed any focal neurologic deficits other than the numbness but does not notice any facial droop.  The symptoms are persistent, she is on aspirin but no other anticoagulants.    Review of the medical record shows  The patient has had an MRI in 2016 showing a prior left occipital lobe infarct.    Past Medical History:  Diagnosis Date  . Abdominal pain   . Arthralgia 08/13/2013  . Bell palsy 2006  . Bell's palsy   . Breast cancer ([place]) 2009   Left Breast Cancer  . Chest pain 09/30/2013  . Cholelithiasis   . Colon cancer screening 10/27/2015  . Complication of anesthesia    " tubal ligation, in [place]"  had weakness, couldnt stand up the next day, was given pills for oxygen for Brain."  1 month after I was having loss of attentiviness, still have to focus  carefully.   . Coronary artery disease   . Depression   . Diabetes mellitus    Type II  . Dyspnea    at times- when air conditioner is runnimg, heat also   . Encounter for screening colonoscopy 10/30/2015  . Hepatic steatosis   . History of breast cancer 06/01/2012  . HTN (hypertension)   . Personal history of chemotherapy 2009   Left Breast Cancer  . Personal  history of radiation therapy 2009   Left Breast Cancer    Patient Active Problem List   Diagnosis Date Noted  . GERD (gastroesophageal reflux disease) 11/27/2017  . Chronic sinusitis 05/16/2017  . Diabetic polyneuropathy associated with type 2 diabetes mellitus ([place]) 10/31/2016  . Impingement syndrome of right ankle 10/31/2016  . Symptomatic cholelithiasis 12/09/2015  . Encounter for screening colonoscopy 10/30/2015  . Colon cancer screening 10/27/2015  . Bronchitis, acute 09/08/2014  . Palpitations 08/28/2014  . Preventive measure 08/21/2014  . Intractable headache 02/21/2014  . TIA (transient ischemic attack) 02/20/2014  . Trigeminal neuralgia 01/22/2014  . Weakness 01/22/2014  . Bell's palsy 01/21/2014  . UTI (urinary tract infection) 01/21/2014  . Hepatic steatosis 01/21/2014  . Headache 01/20/2014  . Diabetes type 2, uncontrolled ([place]) 01/20/2014  . Facial droop 01/15/2014  . Stroke ([place]) 01/15/2014  . Type 2 diabetes mellitus with diabetic neuropathy ([place]) 12/06/2013  . Hyperlipidemia 11/07/2013  . CAD (coronary artery disease), native coronary artery 11/07/2013  . Chest pain 09/30/2013  . Arthralgia 08/13/2013  . History of breast cancer 06/01/2012  . Abdominal pain 01/31/2012  . Nausea & vomiting 01/31/2012  . HTN (hypertension)   . Diabetes mellitus ([place])     Past Surgical History:  Procedure Laterality Date  .  ANKLE ARTHROSCOPY Right 12/14/2016   Procedure: Right Ankle Arthroscopic Debridement;  Surgeon: Newt Minion, MD;  Location: [facility];  Service: Orthopedics;  Laterality: Right;  . Arm surgery     Left tendon lengthen  . BREAST LUMPECTOMY Left 2009  . CHOLECYSTECTOMY N/A 12/09/2015   Procedure: LAPAROSCOPIC CHOLECYSTECTOMY WITH INTRAOPERATIVE CHOLANGIOGRAM;  Surgeon: Greer Pickerel, MD;  Location: [facility];  Service: General;  Laterality: N/A;  . LEFT HEART CATHETERIZATION WITH CORONARY ANGIOGRAM N/A 10/30/2013   Procedure: LEFT HEART CATHETERIZATION WITH  CORONARY ANGIOGRAM;  Surgeon: Josue Hector, MD;  Location[facility] C[facility] LAB;  Service: Cardiovascular;  Laterality: N/A;  . porta cath Right   . Removal of Porta cath Right   . TUBAL LIGATION       OB History    Gravida  6   Para  6   Term  6   Preterm      AB      Living  6     SAB      TAB      Ectopic      Multiple      Live Births               Home Medications    Prior to Admission medications   Medication Sig Start Date End Date Taking? Authorizing Provider  albuterol (PROVENTIL HFA;VENTOLIN HFA) 108 (90 Base) MCG/ACT inhaler Inhale 1-2 puffs into the lungs every 6 (six) hours as needed for wheezing or shortness of breath. 04/25/17   Charlott Rakes, MD  amLODipine (NORVASC) 5 MG tablet Take 1 tablet (5 mg total) by mouth daily. 01/08/18   Jaynee Eagles, PA-C  aspirin 81 MG tablet Take 81 mg by mouth daily.    [provider]  atorvastatin (LIPITOR) 40 MG tablet Take 1 tablet (40 mg total) by mouth daily. 11/27/17   Charlott Rakes, MD  calcium carbonate (TUMS - DOSED IN MG ELEMENTAL CALCIUM) 500 MG chewable tablet Chew 1 tablet by mouth daily. Reported on 05/19/2016    [provider]  carvedilol (COREG) 12.5 MG tablet TAKE 1 TABLET BY MOUTH 2 TIMES DAILY WITH A MEAL. 11/27/17   Charlott Rakes, MD  cetirizine (ZYRTEC) 10 MG tablet Take 1 tablet (10 mg total) by mouth daily. 05/16/17   Charlott Rakes, MD  colchicine 0.6 MG tablet Take 1 tablet (0.6 mg total) by mouth 2 (two) times daily. Take for 3 months only. Patient not taking: Reported on 01/25/2018 01/18/18   Dorothy Spark, MD  fluticasone [facility]) 50 MCG/ACT nasal spray Place 2 sprays into both nostrils daily. 05/16/17   Charlott Rakes, MD  furosemide (LASIX) 20 MG tablet Take 1 tablet (20 mg total) by mouth daily. Take for one week only. Patient not taking: Reported on 01/25/2018 01/18/18 04/18/18  Dorothy Spark, MD  gabapentin (NEURONTIN) 300 MG capsule Take 2 capsules (600 mg total) by  mouth 2 (two) times daily. 11/27/17   Charlott Rakes, MD  glipiZIDE (GLUCOTROL) 10 MG tablet TAKE 1 TABLET BY MOUTH 2 TIMES DAILY BEFORE A MEAL. 11/27/17   Charlott Rakes, MD  Insulin Glargine (LANTUS SOLOSTAR) 100 UNIT/ML Solostar Pen Inject 18 Units into the skin 2 (two) times daily. 11/27/17   Charlott Rakes, MD  lactulose, encephalopathy, (CHRONULAC) 10 GM/15ML SOLN TAKE 15 MLS BY MOUTH 3 TIMES DAILY. Patient not taking: Reported on 01/25/2018 02/13/17   Charlott Rakes, MD  losartan (COZAAR) 100 MG tablet Take 1 tablet (100 mg total) by mouth  daily. Patient not taking: Reported on 01/25/2018 11/27/17   Charlott Rakes, MD  meloxicam (MOBIC) 7.5 MG tablet Take 1 tablet (7.5 mg total) by mouth 2 (two) times daily. X 5 days then prn 01/31/18   Argentina Donovan, PA-C  methocarbamol (ROBAXIN) 500 MG tablet Take 1 tablet (500 mg total) by mouth every 6 (six) hours as needed for muscle spasms. And headache X5 days then prn 01/31/18   Argentina Donovan, PA-C  pantoprazole (PROTONIX) 40 MG tablet Take 1 tablet (40 mg total) by mouth daily. 11/27/17   Charlott Rakes, MD  traZODone (DESYREL) 50 MG tablet Take 1 tablet (50 mg total) by mouth at bedtime as needed for sleep. 05/16/17   Charlott Rakes, MD  TRUEPLUS PEN NEEDLES 31G X 6 MM MISC USE FOR NIGHTLY INSULIN INJECTIONS 02/22/18   Charlott Rakes, MD  escitalopram (LEXAPRO) 10 MG tablet Take 10 mg by mouth daily.  01/31/12  [provider]    Family History Family History  Problem Relation Age of Onset  . Thyroid cancer Sister   . Hypertension Mother   . Diabetes Mother   . Migraines Daughter     Social History Social History   Tobacco Use  . Smoking status: Never Smoker  . Smokeless tobacco: Never Used  Substance Use Topics  . Alcohol use: No  . Drug use: No     Allergies   Gadolinium derivatives   Review of Systems Review of Systems  All other systems reviewed and are negative.    Physical Exam Updated Vital Signs There were  no vitals taken for this visit.  Physical Exam  Constitutional: She appears well-developed and well-nourished. No distress.  HENT:  Head: Normocephalic and atraumatic.  Mouth/Throat: Oropharynx is clear and moist. No oropharyngeal exudate.  Eyes: Pupils are equal, round, and reactive to light. Conjunctivae and EOM are normal. Right eye exhibits no discharge. Left eye exhibits no discharge. No scleral icterus.  Neck: Normal range of motion. Neck supple. No JVD present. No thyromegaly present.  Cardiovascular: Normal rate, regular rhythm, normal heart sounds and intact distal pulses. Exam reveals no gallop and no friction rub.  No murmur heard. Pulmonary/Chest: Effort normal and breath sounds normal. No respiratory distress. She has no wheezes. She has no rales.  Abdominal: Soft. Bowel sounds are normal. She exhibits no distension and no mass. There is no tenderness.  Musculoskeletal: Normal range of motion. She exhibits no edema or tenderness.  Lymphadenopathy:    She has no cervical adenopathy.  Neurological: She is alert. Coordination normal.  She is able to perform finger-nose-finger, she has no obvious cranial nerve deficits, she has no facial droop, she has no slurred speech, she has normal strength in all 4 extremities with grips and normal strength at the shoulders elbows and wrist.  She has normal strength at the hips knees and ankles.  She has a decreased sensation on the left side of the face, the left arm and the left leg.  Skin: Skin is warm and dry. No rash noted. No erythema.  Psychiatric: She has a normal mood and affect. Her behavior is normal.  Nursing note and vitals reviewed.    ED Treatments / Results  Labs (all labs ordered are listed, but only abnormal results are displayed) Labs Reviewed  PROTIME-INR  APTT  CBC  DIFFERENTIAL  COMPREHENSIVE METABOLIC PANEL  I-STAT TROPONIN, ED  CBG MONITORING, ED  I-STAT CHEM 8, ED  I-STAT BETA HCG BLOOD, ED ([facility], [facility], [facility] ONLY)  EKG EKG Interpretation  Date/Time:  Friday Mar 30 2018 15:26:48 EDT Ventricular Rate:  69 PR Interval:    QRS Duration: 104 QT Interval:  405 QTC Calculation: 434 R Axis:   17 Text Interpretation:  Sinus rhythm Baseline wander in lead(s) V1 since last tracing no significant change Confirmed by Noemi Chapel [phone]) on 03/30/2018 3:38:32 PM   Radiology Ct Head Code Stroke Wo Contrast  Result Date: 03/30/2018 CLINICAL DATA:  Code stroke.  LEFT-sided facial droop. EXAM: CT HEAD WITHOUT CONTRAST TECHNIQUE: Contiguous axial images were obtained from the base of the skull through the vertex without intravenous contrast. COMPARISON:  01/25/2018. FINDINGS: Brain: No evidence for acute infarction, hemorrhage, mass lesion, hydrocephalus, or extra-axial fluid. Normal for age cerebral volume. No white matter disease. Old LEFT occipital infarct. Vascular: Calcification of the cavernous internal carotid arteries consistent with cerebrovascular atherosclerotic disease. No signs of intracranial large vessel occlusion. Skull: Calvarium intact. Sinuses/Orbits: No layering sinus fluid or significant opacity. Negative orbits. Other: No mastoid fluid.  Scalp soft tissues unremarkable. ASPECTS [facility] Stroke Program Early CT Score) - Ganglionic level infarction (caudate, lentiform nuclei, internal capsule, insula, M1-M3 cortex): 7 - Supraganglionic infarction (M4-M6 cortex): 3 Total score (0-10 with 10 being normal): 10 IMPRESSION: 1. Chronic changes as described.  No acute infarct or bleed. 2. ASPECTS is 10. These results were communicated to Dr. Cheral Marker at 2:59 pmon 5/10/2019by text page via the [facility] messaging system. Electronically Signed   By: Staci Righter M.D.   On: 03/30/2018 15:02    Procedures Procedures (including critical care time)  Medications Ordered in ED Medications - No data to display   Initial Impression / Assessment and Plan / ED Course  I have reviewed the triage vital signs and the  nursing notes.  Pertinent labs & imaging results that were available during my care of the patient were reviewed by me and considered in my medical decision making (see chart for details).    I detect sensory deficits only but this is acute in onset.  I do not see any overt contraindications to pursuing a code stroke thus it was called immediately.  The patient will go to CT scan  I discussed the care with the stroke team, after labs have resulted she will need to be admitted.  The patient refused thrombolytic therapy.  At this time it seems reasonable given the mild nature of her symptoms.  Mr. Leafy Half to make admission call  Final Clinical Impressions(s) / ED Diagnoses   Final diagnoses:  Acute ischemic stroke [facility])      Noemi Chapel, MD 03/30/18 1539

## 2018-03-30 NOTE — H&P (Signed)
Date: 03/30/2018               Patient Name:  Brandi Bryant MRN: [id number]  DOB: August 03, 1956 Age / Sex: 61 y.o., female   PCP: Charlott Rakes, MD         Medical Service: Internal Medicine Teaching Service         Attending Physician: Dr. Bartholomew Crews, MD    First Contact: Dr. Aggie Hacker Pager: [phone]  Second Contact: Dr. Reesa Chew Pager: [phone]       After Hours (After 5p/  First Contact Pager: [phone]  weekends / holidays): Second Contact Pager: [phone]   Chief Complaint:   History of Present Illness:  62 yo female with PMHx significant for GERD, hypertension, hyperlipidemia, type 2 diabetes mellitus, CAD, Bell's palsy, hx of CVAs (2015, 2017) presenting with chief complaint of left-sided facial numbness.  Patient is Spanish-speaking and is accompanied by her daughter who helps translate.  The patient has a history of Bell's palsy and prior strokes, which have affected the right side of her face.  She does have residual right-sided facial weakness. The patient states that around noon today she started experiencing numbness and tingling in the left side of her face. Patient denied significant left-sided upper or lower weakness. No right sided weakness. Patient denies slurred speech or dysarthria. But per daughter, when the patient called her on the phone her voice sounded slurred and garbled.  Denies visual changes.  No difficulty with ambulation or changes in gait, but the patient's daughter states she has had 2 mechanical falls within past several months. The patient describes it as being clumsy. She also complains of intermittent episodes of palpitations with associated shortness of breath. She denies chest pain. Denies history of DVT or PE. No recent infections or viral like illnesses.  Patient has been experiencing occipital headaches with associated photophobia and nausea.  These have been occurring intermittently over the past 2 weeks. The pain improves with  Tylenol. She denies seeing lights prior to the headaches, but will have a humming sound in her ears prior to the onset.    ED Course:  Vitals: Blood pressure 132/64 pulse 64, resp. rate 14, SpO2 98 % on RA. Labs: CMET and CBC WNL, I-stat trop negative  Meds: none Imaging: CT Head negative for evidence of acute infarction or hemorrhage; stable remote left occipital lobe stroke.   Meds:  Current Facility-Administered Medications for the 03/30/18 encounter [facility] Encounter)  Medication  . aspirin tablet 325 mg   No outpatient medications have been marked as taking for the 03/30/18 encounter [facility] Encounter).     Allergies: Allergies as of 03/30/2018 - Review Complete 03/30/2018  Allergen Reaction Noted  . Gadolinium derivatives Nausea And Vomiting 06/12/2009   Past Medical History:  Diagnosis Date  . Abdominal pain   . Arthralgia 08/13/2013  . Bell palsy 2006  . Bell's palsy   . Breast cancer ([place]) 2009   Left Breast Cancer  . Chest pain 09/30/2013  . Cholelithiasis   . Colon cancer screening 10/27/2015  . Complication of anesthesia    " tubal ligation, in [place]"  had weakness, couldnt stand up the next day, was given pills for oxygen for Brain."  1 month after I was having loss of attentiviness, still have to focus  carefully.   . Coronary artery disease   . Depression   . Diabetes mellitus    Type II  . Dyspnea    at times- when air  conditioner is runnimg, heat also   . Encounter for screening colonoscopy 10/30/2015  . Hepatic steatosis   . History of breast cancer 06/01/2012  . HTN (hypertension)   . Personal history of chemotherapy 2009   Left Breast Cancer  . Personal history of radiation therapy 2009   Left Breast Cancer    Family History:  Family History  Problem Relation Age of Onset  . Thyroid cancer Sister   . Hypertension Mother   . Diabetes Mother   . Migraines Daughter     Social History: Social History   Tobacco Use  . Smoking status:  Never Smoker  . Smokeless tobacco: Never Used  Substance Use Topics  . Alcohol use: No  . Drug use: No   Review of Systems: A complete ROS was negative except as per HPI.   Physical Exam: Blood pressure 132/74, pulse 60, temperature 98.1 F (36.7 C), resp. rate 17, SpO2 99 %. Physical Exam  Constitutional: She is oriented to person, place, and time. She appears well-developed and well-nourished. No distress.  HENT:  Head: Normocephalic and atraumatic.  Eyes: Pupils are equal, round, and reactive to light. Conjunctivae and EOM are normal.  Neck: Normal range of motion. Neck supple.  Cardiovascular: Normal rate, regular rhythm and normal heart sounds.  Pulmonary/Chest: Effort normal and breath sounds normal.  Abdominal: Soft. Bowel sounds are normal. There is no tenderness.  Neurological: She is alert and oriented to person, place, and time. She has normal strength and normal reflexes. A cranial nerve deficit and sensory deficit is present.  Reflex Scores:      Patellar reflexes are 2+ on the right side and 2+ on the left side. Patient exhibits right sided facial weakness (residual). Reports altered sensation on left side of face.   Lower extremity strength 5/5 and symmetric. Right upper extremity strength 4/5. Left upper extremity 5/5. Finger to nose and heal to shin testing within normal limits. No pronator drift.   Skin: Skin is warm and dry.  Psychiatric: She has a normal mood and affect.    EKG: personally reviewed my interpretation is normal sinus rhythm, HR 69, no evidence of acute ischemia  CXR: none to review  Assessment & Plan by Problem: Principal Problem:   CVA (cerebral vascular accident) ([place]) Active Problems:   HTN (hypertension)   Diabetes mellitus ([place])   Hyperlipidemia   Headache  Acute CVA Patient has a history of strokes and is presenting with acute onset left sided facial numbness. Physical exam revealed right sided facial weakness, which patient states  is residual weakness from her history of bells palsy and prior strokes. Otherwise, neurological exam without significant abnormality. CT head negative for hemorrhage or evidence of ischemia. Did not receive tPA as patient declined this intervention. Patient remains high risk for stroke given risk factors: HTN, DM, prior CVA.  -Admit to telemetry -ASA 650 mg for 1 dose followed by ASA 325 mg daily  -MRI Brain WO Contrast  -MRA Head -Echo -Carotid dopplers -Allow for permissive HTN for 24 hours - holding BP meds -Cardiac monitoring  -Lipid Panel  -Hemoglobin A1C -Lipitor 40 mg daily, will likely increase to 80 mg daily  -PT/OT/SLP -Neurology on, appreciate recs  Type 2 DM Most recent A1c 7.9.  Home regimen includes Lantus 18 units BID and glipizide 10 mg BID.  -CBG monitoring TID AC and nightly  -Lantus 10 units BID -SSI sensitive coverage -Repeat A1C pending   Hypertension Home meds include Losartan and amlodipine. Patient  unsure of home medications. Will confirm with pharmacy tomorrow.  -Holding BP meds to allow for permissive HTN  DVT ppx: Lovenox Diet: Carb modified  Code Status: Full code confirmed on admission   Dispo: Admit patient to Observation with expected length of stay less than 2 midnights.  Signed: Melanee Spry, MD 03/30/2018, 6:37 PM  Pager: [phone]

## 2018-03-30 NOTE — ED Provider Notes (Signed)
Care assumed from previous provider Dr. Sabra Heck. Please see their note for further details to include full history and physical. To summarize in short pt is a 62 year old Hispanic female history of prior CVA that presents to the ED with acute onset of left-sided facial numbness, left arm and left leg numbness at approximately noon today. Case discussed, plan agreed upon.  Patient was seen by neurology.  They offered her TPA however patient refused.  Awaiting blood work and hospital admission per neurology recommendations.  I reevaluated patient at 415 and patient states her symptoms have significantly improved.  Lab work remains reassuring.  Spoke with internal medicine teaching service who agrees to admission will see patient in the ED and place admission orders.  Patient remains hemodynamically stable at this time and appears to be in no acute distress.   patient's daughter at bedside was interpreting.       Doristine Devoid, PA-C 03/30/18 1703    Noemi Chapel, MD 03/31/18 [id number]

## 2018-03-30 NOTE — ED Provider Notes (Signed)
Patient placed in Quick Look pathway, seen and evaluated   Chief Complaint: Strokelike symptoms  HPI:   Asked by RN to evaluate patient, concern for his code stroke.  Patient last seen normal at around noon.  Since then she has had subjective decreased sensation on the left side of her face.  Questionable facial droop on the left side.  No obvious pronator drift.  History of 2 strokes in the past  ROS: Concern for facial weakness  Physical Exam:   Gen: No distress  Neuro: Awake and Alert  Skin: Warm    Focused Exam: Left side of face has changed sensation compared to right side.  Slight blunting of the nasolabial crease on the left side.   Initiation of care has begun. The patient has been counseled on the process, plan, and necessity for staying for the completion/evaluation, and the remainder of the medical screening examination  I personally got Dr. Sabra Heck to come and evaluate the patient.  Code stroke was called.  Patient care turned over to Dr. Sabra Heck.    Lorin Glass, PA-C 03/30/18 1448    Noemi Chapel, MD 03/30/18 [phone]

## 2018-03-30 NOTE — ED Notes (Signed)
Code stroke activated ?

## 2018-03-30 NOTE — Code Documentation (Signed)
62yo female arriving to [facility] via private vehicle at 103.  Patient was at work at 1200 when she experienced left facial numbness.  Patient with reported h/o stroke x 2.  Code stroke activated on arrival. Stroke team responded to patient in CT.  NIHSS 3, see documentation for details and code stroke times.  Patient with subjective decreased sensation in the left face, left leg drift and dysarthria (per patient's daughter as patient is Spanish-speaking).  Patient's daughter feels patient's left eye is "smaller."  Of note, patient's daughter reports patient with chest pain yesterday, however, uncertain if this was related to the stress of patient's nephew's funeral yesterday.  Dr. Cheral Marker at the bedside. Treatment with tPA offered to patient, however, she declined treatment. Patient remains in the tPA window until 1630 should symptoms worsen. Bedside handoff with ED RN Horris Latino.

## 2018-03-30 NOTE — ED Notes (Signed)
Patient currently in MRI.

## 2018-03-30 NOTE — Consult Note (Signed)
Referring Physician: Dr. Sabra Heck    Chief Complaint: Right sided weakness  HPI: Brandi Bryant is an 62 y.o. female who presented to the [facility] with acute onset of right sided weakness. She was LKN at work at noon, when she suddenly experienced left facial numbness. On arrival to the ED she also endorsed right sided weakness. Code Stroke was called and patient was rushed to CT, where CT head was negative. Exam showed decreased right face, arm and leg sensation, intermittent decreased contraction of right side of face when asked to grimace, right upper and lower extremity drift. The patient has a reported history of 2 strokes in the past. She is Spanish speaking and daughter provides assistance with interpretation.   LSN: Noon tPA Given: No: The patient refuse tPA after discussion of risks/benefits  Past Medical History:  Diagnosis Date  . Abdominal pain   . Arthralgia 08/13/2013  . Bell palsy 2006  . Bell's palsy   . Breast cancer ([place]) 2009   Left Breast Cancer  . Chest pain 09/30/2013  . Cholelithiasis   . Colon cancer screening 10/27/2015  . Complication of anesthesia    " tubal ligation, in [place]"  had weakness, couldnt stand up the next day, was given pills for oxygen for Brain."  1 month after I was having loss of attentiviness, still have to focus  carefully.   . Coronary artery disease   . Depression   . Diabetes mellitus    Type II  . Dyspnea    at times- when air conditioner is runnimg, heat also   . Encounter for screening colonoscopy 10/30/2015  . Hepatic steatosis   . History of breast cancer 06/01/2012  . HTN (hypertension)   . Personal history of chemotherapy 2009   Left Breast Cancer  . Personal history of radiation therapy 2009   Left Breast Cancer    Past Surgical History:  Procedure Laterality Date  . ANKLE ARTHROSCOPY Right 12/14/2016   Procedure: Right Ankle Arthroscopic Debridement;  Surgeon: Newt Minion, MD;  Location: [place];  Service:  Orthopedics;  Laterality: Right;  . Arm surgery     Left tendon lengthen  . BREAST LUMPECTOMY Left 2009  . CHOLECYSTECTOMY N/A 12/09/2015   Procedure: LAPAROSCOPIC CHOLECYSTECTOMY WITH INTRAOPERATIVE CHOLANGIOGRAM;  Surgeon: Greer Pickerel, MD;  Location: [facility];  Service: General;  Laterality: N/A;  . LEFT HEART CATHETERIZATION WITH CORONARY ANGIOGRAM N/A 10/30/2013   Procedure: LEFT HEART CATHETERIZATION WITH CORONARY ANGIOGRAM;  Surgeon: Josue Hector, MD;  Location: [facility] CATH LAB;  Service: Cardiovascular;  Laterality: N/A;  . porta cath Right   . Removal of Porta cath Right   . TUBAL LIGATION      Family History  Problem Relation Age of Onset  . Thyroid cancer Sister   . Hypertension Mother   . Diabetes Mother   . Migraines Daughter    Social History:  reports that she has never smoked. She has never used smokeless tobacco. She reports that she does not drink alcohol or use drugs.  Allergies:  Allergies  Allergen Reactions  . Gadolinium Derivatives Nausea And Vomiting    Code: VOM, Desc: Pt began vomiting 45 sec after MRI contrast injection of Multihance, Onset Date: [id number]      Home Medications:    ROS: As per HPI.   Physical Examination: Blood pressure 119/64, pulse (!) 59, resp. rate 11, SpO2 100 %.  HEENT: [place]/AT Lungs: Respirations unlabored Ext: No edema  Neurologic Examination: Mental Status: Alert. Anxious.  Speech mildly dysarthric and halting. No errors of grammar/syntax per daughter. Able to follow all commands without difficulty. Cranial Nerves: II:  Visual fields intact bilaterally. PERRL.  III,IV, VI: Ptosis not present, EOMI without nystagmus V,VII: Moves mouth symmetrically while speaking and with facial expression while distracted. Decreased contraction periorally on the right when asked to smile. Subjective decreased temp sensation on right.  VIII: hearing intact to voice IX,X: No hypophonia or hoarseness XI: Symmetric XII: midline tongue extension   Motor: RUE and RLE with drift and inconsistent resistance to examiner, rated as 3-4/5 with occasional maximum strength elicited at 5/5 LUE and LLE: No drift. 5/5 proximal and distal Sensory: Decreased temp sensation right upper and lower ext. One instance of extinction to DSS on right. Deep Tendon Reflexes:  Normoactive and symmetric x 4 Plantars: Right: downgoing  Left: downgoing Cerebellar: No ataxia with FNF bilaterally. No ataxia with H-S bilaterally.  Gait: Deferred   Results for orders placed or performed during the hospital encounter of 03/30/18 (from the past 48 hour(s))  CBC     Status: None   Collection Time: 03/30/18  3:21 PM  Result Value Ref Range   WBC 7.3 4.0 - 10.5 K/uL   RBC 4.46 3.87 - 5.11 MIL/uL   Hemoglobin 13.6 12.0 - 15.0 g/dL   HCT 39.3 36.0 - 46.0 %   MCV 88.1 78.0 - 100.0 fL   MCH 30.5 26.0 - 34.0 pg   MCHC 34.6 30.0 - 36.0 g/dL   RDW 12.5 11.5 - 15.5 %   Platelets 167 150 - 400 K/uL    Comment: Performed at [facility] [address] [place], [place] [address]  Differential     Status: None   Collection Time: 03/30/18  3:21 PM  Result Value Ref Range   Neutrophils Relative % 43 %   Neutro Abs 3.2 1.7 - 7.7 K/uL   Lymphocytes Relative 37 %   Lymphs Abs 2.7 0.7 - 4.0 K/uL   Monocytes Relative 11 %   Monocytes Absolute 0.8 0.1 - 1.0 K/uL   Eosinophils Relative 9 %   Eosinophils Absolute 0.6 0.0 - 0.7 K/uL   Basophils Relative 0 %   Basophils Absolute 0.0 0.0 - 0.1 K/uL    Comment: Performed at [facility] [address], [place], [place] [address]  I-stat troponin, ED     Status: None   Collection Time: 03/30/18  3:40 PM  Result Value Ref Range   Troponin i, poc 0.00 0.00 - 0.08 ng/mL   Comment 3            Comment: Due to the release kinetics of cTnI, a negative result within the first hours of the onset of symptoms does not rule out myocardial infarction with certainty. If myocardial infarction is still suspected, repeat  the test at appropriate intervals.   I-Stat beta hCG blood, ED     Status: None   Collection Time: 03/30/18  3:40 PM  Result Value Ref Range   I-stat hCG, quantitative <5.0 <5 mIU/mL   Comment 3            Comment:   GEST. AGE      CONC.  (mIU/mL)   <=1 WEEK        5 - 50     2 WEEKS       50 - 500     3 WEEKS       100 - 10,000  4 WEEKS     1,000 - 30,000        FEMALE AND NON-PREGNANT FEMALE:     LESS THAN 5 mIU/mL   I-Stat Chem 8, ED     Status: Abnormal   Collection Time: 03/30/18  3:42 PM  Result Value Ref Range   Sodium 143 135 - 145 mmol/L   Potassium 3.9 3.5 - 5.1 mmol/L   Chloride 107 101 - 111 mmol/L   BUN 17 6 - 20 mg/dL   Creatinine, Ser 0.90 0.44 - 1.00 mg/dL   Glucose, Bld 188 (H) 65 - 99 mg/dL   Calcium, Ion 1.20 1.15 - 1.40 mmol/L   TCO2 24 22 - 32 mmol/L   Hemoglobin 13.3 12.0 - 15.0 g/dL   HCT 39.0 36.0 - 46.0 %   Ct Head Code Stroke Wo Contrast  Result Date: 03/30/2018 CLINICAL DATA:  Code stroke.  LEFT-sided facial droop. EXAM: CT HEAD WITHOUT CONTRAST TECHNIQUE: Contiguous axial images were obtained from the base of the skull through the vertex without intravenous contrast. COMPARISON:  01/25/2018. FINDINGS: Brain: No evidence for acute infarction, hemorrhage, mass lesion, hydrocephalus, or extra-axial fluid. Normal for age cerebral volume. No white matter disease. Old LEFT occipital infarct. Vascular: Calcification of the cavernous internal carotid arteries consistent with cerebrovascular atherosclerotic disease. No signs of intracranial large vessel occlusion. Skull: Calvarium intact. Sinuses/Orbits: No layering sinus fluid or significant opacity. Negative orbits. Other: No mastoid fluid.  Scalp soft tissues unremarkable. ASPECTS [facility] Stroke Program Early CT Score) - Ganglionic level infarction (caudate, lentiform nuclei, internal capsule, insula, M1-M3 cortex): 7 - Supraganglionic infarction (M4-M6 cortex): 3 Total score (0-10 with 10 being normal): 10  IMPRESSION: 1. Chronic changes as described.  No acute infarct or bleed. 2. ASPECTS is 10. These results were communicated to Dr. Cheral Marker at 2:59 pmon 5/10/2019by text page via the [facility] messaging system. Electronically Signed   By: Staci Righter M.D.   On: 03/30/2018 15:02    Assessment: 62 y.o. female presenting with acute onset of right sided weakness 1. Exam with inconsistencies in motor strength suggestive of a functional component. Stroke could not be ruled out with imaging alone and deficits if physiological, most likely secondary to small vessel ischemia. Time delay for MRI felt to be unacceptable risk if this is a stroke, prior to initiating discussion regarding possible tPA treatment. Discussed risks/benefits of IV tPA versus no tPA with patient and her daughter. The patient and daughter expressed understanding with the risk/benefit profile and after brief discussion between daughter and patient, the patient refused tPA.  2. CT head reveals no evidence for acute infarction, hemorrhage, mass lesion, hydrocephalus, or extra-axial fluid. Normal for age cerebral volume. No white matter disease. Old LEFT occipital infarct. Vascular: Calcification of the cavernous internal carotid arteries consistent with cerebrovascular atherosclerotic disease. No signs of intracranial large vessel occlusion. 3. Stroke Risk Factors - As per PMHx above  Plan: 1. HgbA1c, fasting lipid panel 2. MRI, MRA of the brain without contrast 3. PT consult, OT consult, Speech consult 4. Echocardiogram 5. Carotid dopplers 6. Prophylactic therapy- ASA 650 mg po x 1, then continue 325 mg po qd. Stroke team to make determination regarding possible switch to Plavix after stroke work up results are available.  7. Risk factor modification 8. Telemetry monitoring 9. Frequent neuro checks 10. Continue atorvastatin 11. Permissive HTN x 24 hours  @Electronically  signed: Dr. Kerney Elbe 03/30/2018, 4:07 PM

## 2018-03-31 ENCOUNTER — Observation Stay (HOSPITAL_BASED_OUTPATIENT_CLINIC_OR_DEPARTMENT_OTHER): Payer: Self-pay

## 2018-03-31 DIAGNOSIS — I63239 Cerebral infarction due to unspecified occlusion or stenosis of unspecified carotid arteries: Secondary | ICD-10-CM

## 2018-03-31 DIAGNOSIS — I361 Nonrheumatic tricuspid (valve) insufficiency: Secondary | ICD-10-CM

## 2018-03-31 LAB — LIPID PANEL
Cholesterol: 107 mg/dL (ref 0–200)
HDL: 41 mg/dL (ref >40)
LDL Cholesterol: 33 mg/dL (ref 0–99)
Total CHOL/HDL Ratio: 2.6 RATIO
Triglycerides: 167 mg/dL — ABNORMAL HIGH (ref <150)
VLDL: 33 mg/dL (ref 0–40)

## 2018-03-31 LAB — GLUCOSE, CAPILLARY
Glucose-Capillary: 123 mg/dL — ABNORMAL HIGH (ref 65–99)
Glucose-Capillary: 147 mg/dL — ABNORMAL HIGH (ref 65–99)
Glucose-Capillary: 228 mg/dL — ABNORMAL HIGH (ref 65–99)

## 2018-03-31 LAB — ECHOCARDIOGRAM COMPLETE
Height: 60 in
Weight: 2447.99 oz

## 2018-03-31 LAB — HEMOGLOBIN A1C
HEMOGLOBIN A1C: 6.5 % — AB (ref 4.8–5.6)
Mean Plasma Glucose: 139.85 mg/dL

## 2018-03-31 LAB — HIV ANTIBODY (ROUTINE TESTING W REFLEX): HIV Screen 4th Generation wRfx: NONREACTIVE

## 2018-03-31 MED ORDER — PANTOPRAZOLE SODIUM 40 MG PO TBEC: 40.0000 mg | DELAYED_RELEASE_TABLET | Freq: Every day | ORAL | Status: DC

## 2018-03-31 MED ORDER — ASPIRIN 325 MG PO TBEC: 325.0000 mg | DELAYED_RELEASE_TABLET | Freq: Every day | ORAL | 0 refills | Status: DC

## 2018-03-31 NOTE — Progress Notes (Signed)
Spoke to [employer]. Stated Echo would be done today, no start time for test available. Pt awaiting discharge.

## 2018-03-31 NOTE — Progress Notes (Signed)
  Date: 03/31/2018  Patient name: Brandi Bryant  Medical record number: [id number]  Date of birth: 08/17/1956   I have seen and evaluated Brandi Bryant and discussed their care with the Residency Team. Brandi Bryant is a 62 yo female with DM, HTN, Bell's palsy affecting R side, and ischemic CVA's. She presented as a code stroke with sudden onset of numbness and tingling on her L face. CT was negative for a hemorrhage and pt declined tPA after discussion with neuro. Pt's MRI is negative for acute CVA although is showed severe distal severe B carotid stenosis. Pt's sxs are improved this AM as she only reports L sided itching.  Vitals:   03/31/18 0654 03/31/18 1058  BP: 113/67 (!) 122/54  Pulse: 60 60  Resp: 18 18  Temp: 98 F (36.7 C) 98.2 F (36.8 C)  SpO2: 98% (!) 66%  HRRR no MRG LCTAB with good air flow ABD + BS, soft Ext no edema Neuro per Dr Revonda Standard exam  A1C 6.5 LDL 33 HDL 41  I personally viewed the EKG and confirmed my reading with the official read. Sinus, nl axis, no ischemia  Assessment and Plan: I have seen and evaluated the patient as outlined above. I agree with the formulated Assessment and Plan as detailed in the residents' note, with the following changes:  Brandi Bryant is a 62 yo female with DM, HTN, Bell's palsy affecting R side, and ischemic CVA's. She has R/O for a CVA although a TIA remains possible. She has been on ASA 81 and a high intensity statin. HTN and DM are well controlled.   1. TIA - neuro recs increasing ASA to 325 mg. Cont high intensity statin.   2. Distal severe B carotid artery stenosis - outpt vascular or NS referral  3. Chronic cough - pt's CXR in Sep 2018 nl. Pt has losartan on med list but family states not taking. If she had been, may takes months for ARB cough to resolve. Would not rec losartan on D/C but outpt MD may substitute a different ARB if indicated. Also could be GERD - cont PPI and ensure she  is taking properly. Might also switch to PM as cough worse at night.  D/C home today  Bartholomew Crews, MD 5/11/201912:52 PM

## 2018-03-31 NOTE — Progress Notes (Signed)
Pt c/o left side of mouth and face numbness  And decreased sensation on the left ,  side compared to the right side. Noted left leg drift at this time. No change in neuro status.  RN will continue to monitor and report any change in neuro chceck.  Pt denied H/D at this time.

## 2018-03-31 NOTE — Progress Notes (Signed)
*  PRELIMINARY RESULTS* Vascular Ultrasound Carotid Duplex (Doppler) has been completed.  Preliminary findings: Bilateral 1-39% ICA stenosis, antegrade vertebral flow.   Everrett Coombe 03/31/2018, 12:25 PM

## 2018-03-31 NOTE — Evaluation (Signed)
Occupational Therapy Evaluation and Discharge Patient Details Name: Brandi Bryant MRN: [id number] DOB: 1956/07/24 Today's Date: 03/31/2018    History of Present Illness Pt is a 62 y.o. female with PMHx significant for GERD, hypertension, hyperlipidemia, type 2 diabetes mellitus, CAD, Bell's palsy, and hx of CVAs (2015, 2017). She presented with chief complaint of left-sided facial numbness.  MRI negative.    Clinical Impression   Pt reports she was independent with ADL PTA. Currently pt is at her functional baseline and is independent-mod I with ADL and mobility. Pt reports increased fatigue and residual sensation changes on L side of face. Pt planning to d/c home with 24/7 supervision from family. No further acute OT needs identified; signing off at this time. Please re-consult if needs change. Thank you for this referral.    Follow Up Recommendations  No OT follow up;Supervision - Intermittent    Equipment Recommendations  None recommended by OT    Recommendations for Other Services       Precautions / Restrictions Precautions Precautions: None Restrictions Weight Bearing Restrictions: No      Mobility Bed Mobility Overal bed mobility: Independent                Transfers Overall transfer level: Modified independent Equipment used: None             General transfer comment: increased time    Balance Overall balance assessment: Mild deficits observed, not formally tested                                         ADL either performed or assessed with clinical judgement   ADL Overall ADL's : At baseline                                             Vision Baseline Vision/History: Wears glasses Wears Glasses: At all times Patient Visual Report: No change from baseline Vision Assessment?: No apparent visual deficits     Perception     Praxis      Pertinent Vitals/Pain Pain Assessment: No/denies pain      Hand Dominance Right   Extremity/Trunk Assessment Upper Extremity Assessment Upper Extremity Assessment: Overall WFL for tasks assessed   Lower Extremity Assessment Lower Extremity Assessment: Defer to PT evaluation   Cervical / Trunk Assessment Cervical / Trunk Assessment: Normal   Communication Communication Communication: Prefers language other than English(son interpreting)   Cognition Arousal/Alertness: Awake/alert Behavior During Therapy: WFL for tasks assessed/performed Overall Cognitive Status: Within Functional Limits for tasks assessed                                 General Comments: Son reports pt is currently at her cognitive baseline, just seems more tired that usual   General Comments       Exercises     Shoulder Instructions      Home Living Family/patient expects to be discharged to:: Private residence Living Arrangements: Children Available Help at Discharge: Family;Available 24 hours/day Type of Home: House Home Access: Stairs to enter [employer] of Steps: 3 Entrance Stairs-Rails: None Home Layout: One level     Bathroom Shower/Tub: Occupational psychologist: Standard  Home Equipment: None          Prior Functioning/Environment Level of Independence: Independent        Comments: Drives. Ambulates in community.        OT Problem List:        OT Treatment/Interventions:      OT Goals(Current goals can be found in the care plan section) Acute Rehab OT Goals Patient Stated Goal: not stated OT Goal Formulation: All assessment and education complete, DC therapy  OT Frequency:     Barriers to D/C:            Co-evaluation              AM-PAC PT "6 Clicks" Daily Activity     Outcome Measure Help from another person eating meals?: None Help from another person taking care of personal grooming?: None Help from another person toileting, which includes using toliet, bedpan, or  urinal?: None Help from another person bathing (including washing, rinsing, drying)?: None Help from another person to put on and taking off regular upper body clothing?: None Help from another person to put on and taking off regular lower body clothing?: None 6 Click Score: 24   End of Session    Activity Tolerance: Patient tolerated treatment well Patient left: in chair;with call bell/phone within reach;with family/visitor present  OT Visit Diagnosis: Unsteadiness on feet (R26.81)                Time: 0211-1735 OT Time Calculation (min): 10 min Charges:  OT General Charges $OT Visit: 1 Visit OT Evaluation $OT Eval Low Complexity: 1 Low G-Codes:     Adriel Desrosier A. Ulice Brilliant, M.S., OTR/L Acute Rehab Department: [phone]  Binnie Kand 03/31/2018, 12:07 PM

## 2018-03-31 NOTE — Progress Notes (Signed)
Pt and family asking about time of echo for discharge. Called echo and spoke to tech. Confirmed for second time that pt was on list, echo tech aware test is delaying discharge. Family and pt updated.

## 2018-03-31 NOTE — Progress Notes (Signed)
STROKE TEAM PROGRESS NOTE   HISTORY OF PRESENT ILLNESS (per record) Brandi Bryant is an 62 y.o. female who presented to the [facility] with acute onset of right sided weakness. She was LKN at work at noon, when she suddenly experienced left facial numbness. On arrival to the ED she also endorsed right sided weakness. Code Stroke was called and patient was rushed to CT, where CT head was negative. Exam showed decreased right face, arm and leg sensation, intermittent decreased contraction of right side of face when asked to grimace, right upper and lower extremity drift. The patient has a reported history of 2 strokes in the past. She is Spanish speaking and daughter provides assistance with interpretation.   LSN: Noon tPA Given: No: The patient refused tPA after discussion of risks/benefits     SUBJECTIVE (INTERVAL HISTORY) Her son is at the bedside.  He stated that his brother has lost his son couple days back and that caused his mom to be very sad and stressed     OBJECTIVE Temp:  [97.8 F (36.6 C)-98.1 F (36.7 C)] 98 F (36.7 C) (05/11 0654) Pulse Rate:  [58-72] 60 (05/11 0654) Cardiac Rhythm: Normal sinus rhythm (05/11 0700) Resp:  [11-23] 18 (05/11 0654) BP: (104-148)/(54-76) 113/67 (05/11 0654) SpO2:  [94 %-100 %] 98 % (05/11 0654) Weight:  [153 lb (69.4 kg)] 153 lb (69.4 kg) (05/10 2043)  CBC:  Recent Labs  Lab 03/30/18 1521 03/30/18 1542  WBC 7.3  --   NEUTROABS 3.2  --   HGB 13.6 13.3  HCT 39.3 39.0  MCV 88.1  --   PLT 167  --     Basic Metabolic Panel:  Recent Labs  Lab 03/30/18 1521 03/30/18 1542  NA 143 143  K 3.9 3.9  CL 110 107  CO2 26  --   GLUCOSE 190* 188*  BUN 16 17  CREATININE 0.93 0.90  CALCIUM 9.4  --     Lipid Panel:     Component Value Date/Time   CHOL 107 03/31/2018 0348   CHOL 130 11/28/2017 0937   TRIG 167 (H) 03/31/2018 0348   HDL 41 03/31/2018 0348   HDL 40 11/28/2017 0937   CHOLHDL 2.6 03/31/2018 0348   VLDL 33  03/31/2018 0348   LDLCALC 33 03/31/2018 0348   LDLCALC 56 11/28/2017 0937   HgbA1c:  Lab Results  Component Value Date   HGBA1C 6.5 (H) 03/31/2018   Urine Drug Screen:     Component Value Date/Time   LABOPIA NONE DETECTED 02/21/2014 0443   COCAINSCRNUR NONE DETECTED 02/21/2014 0443   LABBENZ NONE DETECTED 02/21/2014 0443   AMPHETMU NONE DETECTED 02/21/2014 0443   THCU NONE DETECTED 02/21/2014 0443   LABBARB POSITIVE (A) 02/21/2014 0443    Alcohol Level No results found for: [facility]  IMAGING   Mr Jodene Nam Head Wo Contrast 03/30/2018 IMPRESSION:  1. No acute infarct.  2. Mild findings of chronic microvascular ischemia and old left occipital lobe infarct.  3. Severe stenosis of both distal internal carotid arteries involving the cavernous and clinoid segments.  4. No other intracranial stenosis.     Ct Head Code Stroke Wo Contrast 03/30/2018  IMPRESSION:  1. Chronic changes as described.  No acute infarct or bleed.  2. ASPECTS is 10.     Transthoracic Echocardiogram - pending    Bilateral Carotid Dopplers - pending      PHYSICAL EXAM Vitals:   03/31/18 0100 03/31/18 0300 03/31/18 0457 03/31/18 0654  BP: 112/66 104/60 Marland Kitchen)  107/54 113/67  Pulse: (!) 58  (!) 58 60  Resp: 16 18 18 18   Temp: 98.1 F (36.7 C) 97.9 F (36.6 C) 97.9 F (36.6 C) 98 F (36.7 C)  TempSrc: Oral Oral Oral Oral  SpO2: 97% 95% 97% 98%  Weight:      Height:        General -  Heart - Regular rate and rhythm - no murmer appreciated Lungs - Clear to auscultation anteriorly Abdomen - Soft - non tender Extremities - Distal pulses intact - no edema Skin - Warm and dry  Mental Status: Alert, oriented to self and place  Speech fluent without evidence of aphasia.  Able to follow 3 step commands without difficulty. Cranial Nerves: intact  I Motor: RUE - 5/5    LUE - 5/5 RLE - 5/5    LLE -  5/5 Tone and bulk:normal tone throughout; no atrophy noted Sensory: Light touch intact throughout,  bilaterally Cerebellar: normal finger-to-nose, normal rapid alternating movements and normal heel-to-shin test Gait: not tested    ASSESSMENT/PLAN [name] is a 62 y.o. female with history of previous strokes, hypertension, diabetes mellitus, coronary artery disease, depression, palsy, and history of breast cancer presenting with right-sided weakness. The patient declined TPA.  Possible TIA vs functional weakness due top stress due to grandson recent death ( according to her son)   CT head - No acute infarct or bleed.   MRI head - No acute infarct.   MRA head - Severe stenosis of both distal internal carotid arteries  Carotid Doppler - pending  2D Echo - pending  LDL - 33  HgbA1c - 6.5  VTE prophylaxis - Lovenox Diet Order           Diet Carb Modified Fluid consistency: Thin; Room service appropriate? Yes  Diet effective now          aspirin 81 mg daily prior to admission, now on aspirin 325 mg daily  Patient counseled to be compliant with her antithrombotic medications  Ongoing aggressive stroke risk factor management  Therapy recommendations:  pending  Disposition:  Pending  Hypertension  Stable . Permissive hypertension (OK if < 220/120) but gradually normalize in 5-7 days . Long-term BP goal normotensive  Hyperlipidemia  Lipid lowering medication PTA: Lipitor 40 mg daily  LDL 33, goal < 70  Current lipid lowering medication:Lipitor 40 mg daily  Continue statin at discharge  Diabetes  HgbA1c 6.5, goal < 7.0  Controlled  Other Stroke Risk Factors  Advanced age  Obesity, Body mass index is 29.88 kg/m., recommend weight loss, diet and exercise as appropriate   Hx stroke/TIA  Coronary artery disease  Other Active Problems  Severe stenosis of both distal internal carotid arteries   Plan / Recommendations   Stroke workup: awaiting - Echo and carotid dopplers  Therapy Follow Up: pending  Disposition: can be  discharged home from neuro stand of point once she is cleared by physical and occupational  therapy  Antiplatelet / Anticoagulation: Continue ASA 325.   Statin: continue Lipitor 40 mg daily.  MD Follow Up: stroke clinic follow up in 1-2 weeks with  Dr Leonie Man  Carotid stenosis B/L vascular surgery or neurosurgery consult and follow up for possible CEA vs Stent  -Psychiatry follow up for depression, ?stress related functional weakness    -Plan discussed in details with the patient and her son at bedside   Further risk factor modification per primary care MD: Follow Up 2 weeks  Will  sign off please call us with any questions/changes.   Hospital day # 0  Arnaldo Natal, MD   To contact Stroke Continuity provider, please refer to [email or web address] After hours, contact General Neurology

## 2018-03-31 NOTE — Plan of Care (Signed)
Pt able to ambulate well. Pt improving and able to verbalize and demonstrate safe techniques.

## 2018-03-31 NOTE — Progress Notes (Signed)
Pt provided with spanish and [place] discharge papers. Son at bedside provided with paperwork. Questions asked and answered. Pt denies need for translator at this time and prefers to use son. Pt denies questions through son. Son denies further questions or needs. Son verbalizes understanding to follow up with MD on paperwork provided. Pt transported via wheelchair to private vehicle. D/c home.

## 2018-03-31 NOTE — Progress Notes (Addendum)
   Subjective:  Patient seen and examined. No acute events overnight. States she is doing well this morning.  Reports left-sided facial itching, but denies numbness or weakness.  She reports no difficulty working with physical therapy. Her only acute complaint is a cough that typically occurs when she is lying down. It is associated with sore throat and reflux symptoms.  Patient is amenable to discharge later today.  Objective:  Vital signs in last 24 hours: Vitals:   03/30/18 2305 03/31/18 0100 03/31/18 0300 03/31/18 0457  BP: 120/60 112/66 104/60 (!) 107/54  Pulse: 62 (!) 58  (!) 58  Resp: 18 16 18 18   Temp: 97.8 F (36.6 C) 98.1 F (36.7 C) 97.9 F (36.6 C) 97.9 F (36.6 C)  TempSrc: Oral Oral Oral Oral  SpO2: 95% 97% 95% 97%  Weight:      Height:       General: Laying in bed comfortably, NAD HEENT: [place]/AT, EOMI, no scleral icterus, PERRL Cardiac: RRR, No R/M/G appreciated Pulm: normal effort, CTAB Abd: soft, non tender, non distended, BS normal Ext: extremities well perfused, no peripheral edema Neuro: alert and oriented X3, EOMI, mild right sided facial weakness (residual) compared to left; subjective altered sensation on left side of face compared to right; no other focal neurological deficits; strength 4/5 right upper extremity, 5/5 left upper extremity; 5/5 strength bilateral lower extremities; 2 patellar reflex, symmetric   Assessment/Plan:  Principal Problem:   CVA (cerebral vascular accident) ([place]) Active Problems:   HTN (hypertension)   Diabetes mellitus ([place])   Hyperlipidemia   Headache  TIA Patient presenting with acute onset left sided facial numbness. She declined tPA intervention. MRI negative for acute stroke.  MRA head did demonstrate severe bilateral intracranial ICA stenosis. Symptoms improved this AM, only describes left facial itching, no associated weakness. PT evaluated the patient and recommends no further PT services.  -Neuro on, appreciate  recs -ASA 325 mg daily, may require plavix will f/u neuro recs -Echo - pending -Carotid dopplers - completed, results pending  -Cardiac monitoring  -Lipid Panel - WNL, continue Lipitor 40 mg daily -Hemoglobin A1C - 6.5  -OT/SLP eval  -May require vascular surgery work up as outpatient   Type 2 DM Currently well controlled. Hemoglobin A1c 6.5. Currently meeting inpatient goals.  -CBG monitoring TID AC and nightly  -Lantus 10 units BID -SSI sensitive coverage -will resume home meds at discharge  Hypertension Currently normotensive. -Continue to hold BP meds as patient is normotensive -Home meds to be resumed at discharge include amlodipine 5 mg and coreg 12.5 mg BID Cough  Initial concern was for ACE/ARB induced cough, but unclear if she is taking Losartan or not Her current BP regimen reported is only amlodipine and norvasc. Cough may be secondary to uncontrolled GERD, as it occurs as night and is associated with heartburn and bad taste in the mouth.  -Continue Protonix 40 mg daily  -May require further outpatient evaluation, such as EGD  DVT ppx: Lovenox Diet: Carb modified  Code Status: Full code confirmed on admission     Dispo: Anticipated discharge in approximately today.   Melanee Spry, MD 03/31/2018, 6:30 AM Pager: [phone]

## 2018-03-31 NOTE — Progress Notes (Signed)
  Echocardiogram 2D Echocardiogram has been performed.  Brandi Bryant 03/31/2018, 3:50 PM

## 2018-03-31 NOTE — Evaluation (Addendum)
Physical Therapy Evaluation Patient Details Name: Brandi Bryant MRN: [id number] DOB: 02-17-1956 Today's Date: 03/31/2018   History of Present Illness  Pt is a 62 y.o. female with PMHx significant for GERD, hypertension, hyperlipidemia, type 2 diabetes mellitus, CAD, Bell's palsy, and hx of CVAs (2015, 2017). She presented with chief complaint of left-sided facial numbness.  MRI negative.     Clinical Impression  Pt admitted with above diagnosis. Pt currently with functional limitations due to the deficits listed below (see PT Problem List). PTA pt lived at home with family, independent with mobility. On eval, she demonstrated independence with bed mobility and transfers. Supervision provided for ambulation 150 feet without AD. No LOB noted. Pt moves slowly. Pt will benefit from skilled PT to increase their independence and safety with mobility to allow discharge to the venue listed below.  PT to follow acutely. No follow up services indicated.     Follow Up Recommendations No PT follow up;Supervision for mobility/OOB    Equipment Recommendations  None recommended by PT    Recommendations for Other Services       Precautions / Restrictions Precautions Precautions: None      Mobility  Bed Mobility Overal bed mobility: Independent                Transfers Overall transfer level: Modified independent Equipment used: None             General transfer comment: increased time to stabilize initial standing balance. No assist needed. No LOB.  Ambulation/Gait Ambulation/Gait assistance: Supervision Ambulation Distance (Feet): 150 Feet Assistive device: None Gait Pattern/deviations: Step-through pattern;Decreased stride length Gait velocity: decreased Gait velocity interpretation: 1.31 - 2.62 ft/sec, indicative of limited community ambulator General Gait Details: slow, steady gait  Stairs Stairs: Yes Stairs assistance: Min guard Stair Management: No  rails;Forwards;Step to pattern Number of Stairs: 5    Wheelchair Mobility    Modified Rankin (Stroke Patients Only)       Balance Overall balance assessment: Mild deficits observed, not formally tested                                           Pertinent Vitals/Pain Pain Assessment: No/denies pain    Home Living Family/patient expects to be discharged to:: Private residence Living Arrangements: Children Available Help at Discharge: Family;Available 24 hours/day Type of Home: House Home Access: Stairs to enter Entrance Stairs-Rails: None Entrance Stairs-Number of Steps: 3 Home Layout: One level Home Equipment: None      Prior Function Level of Independence: Independent         Comments: Drives. Ambulates in community.     Hand Dominance   Dominant Hand: Right    Extremity/Trunk Assessment   Upper Extremity Assessment Upper Extremity Assessment: Defer to OT evaluation    Lower Extremity Assessment Lower Extremity Assessment: Overall WFL for tasks assessed    Cervical / Trunk Assessment Cervical / Trunk Assessment: Normal  Communication   Communication: Prefers language other than English(son present to interpret )  Cognition Arousal/Alertness: Awake/alert Behavior During Therapy: WFL for tasks assessed/performed Overall Cognitive Status: Difficult to assess                                 General Comments: Pt appropriate. Following all commands. Son utilized as Astronomer.  General Comments      Exercises     Assessment/Plan    PT Assessment Patient needs continued PT services  PT Problem List Decreased mobility;Decreased activity tolerance;Decreased balance       PT Treatment Interventions Therapeutic activities;Gait training;Therapeutic exercise;Patient/family education;Stair training;Balance training;Functional mobility training    PT Goals (Current goals can be found in the Care Plan section)   Acute Rehab PT Goals Patient Stated Goal: not stated PT Goal Formulation: With patient/family Time For Goal Achievement: 04/14/18 Potential to Achieve Goals: Good    Frequency Min 3X/week   Barriers to discharge        Co-evaluation               AM-PAC PT "6 Clicks" Daily Activity  Outcome Measure Difficulty turning over in bed (including adjusting bedclothes, sheets and blankets)?: None Difficulty moving from lying on back to sitting on the side of the bed? : None Difficulty sitting down on and standing up from a chair with arms (e.g., wheelchair, bedside commode, etc,.)?: None Help needed moving to and from a bed to chair (including a wheelchair)?: None Help needed walking in hospital room?: A Little Help needed climbing 3-5 steps with a railing? : A Little 6 Click Score: 22    End of Session Equipment Utilized During Treatment: Gait belt Activity Tolerance: Patient tolerated treatment well Patient left: Other (comment);with family/visitor present(in w/c with transport) Nurse Communication: Mobility status PT Visit Diagnosis: Unsteadiness on feet (R26.81)    Time: 8546-2703 PT Time Calculation (min) (ACUTE ONLY): 20 min   Charges:   PT Evaluation $PT Eval Low Complexity: 1 Low     PT G Codes:        Lorrin Goodell, PT  Office # [phone] Pager [phone]   Lorriane Shire 03/31/2018, 9:51 AM

## 2018-04-02 ENCOUNTER — Telehealth: Payer: Self-pay | Admitting: Internal Medicine

## 2018-04-02 NOTE — Discharge Summary (Signed)
Name: Brandi Bryant MRN: [id number] DOB: 1956/06/04 62 y.o. PCP: Charlott Rakes, MD  Date of Admission: 03/30/2018  2:42 PM Date of Discharge: 03/31/2018 Attending Physician: Larey Dresser, MD  Discharge Diagnosis: 1. Transient Ischemic Attack  Principal Problem:   TIA (transient ischemic attack) Active Problems:   HTN (hypertension)   Diabetes mellitus ([place])   Hyperlipidemia   Headache   Discharge Medications: Allergies as of 03/31/2018      Reactions   Gadolinium Derivatives Nausea And Vomiting   Code: VOM, Desc: Pt began vomiting 45 sec after MRI contrast injection of Multihance, Onset Date: [id number]      Medication List    STOP taking these medications   aspirin 81 MG tablet Replaced by:  aspirin 325 MG EC tablet   colchicine 0.6 MG tablet   furosemide 20 MG tablet Commonly known as:  LASIX   lactulose (encephalopathy) 10 GM/15ML Soln Commonly known as:  CHRONULAC   losartan 100 MG tablet Commonly known as:  COZAAR     TAKE these medications   albuterol 108 (90 Base) MCG/ACT inhaler Commonly known as:  PROVENTIL HFA;VENTOLIN HFA Inhale 1-2 puffs into the lungs every 6 (six) hours as needed for wheezing or shortness of breath.   amLODipine 5 MG tablet Commonly known as:  NORVASC Take 1 tablet (5 mg total) by mouth daily.   aspirin 325 MG EC tablet Take 1 tablet (325 mg total) by mouth daily. Replaces:  aspirin 81 MG tablet   atorvastatin 40 MG tablet Commonly known as:  LIPITOR Take 1 tablet (40 mg total) by mouth daily.   carvedilol 12.5 MG tablet Commonly known as:  COREG TAKE 1 TABLET BY MOUTH 2 TIMES DAILY WITH A MEAL.   cetirizine 10 MG tablet Commonly known as:  ZYRTEC Take 1 tablet (10 mg total) by mouth daily.   fluticasone 50 MCG/ACT nasal spray Commonly known as:  FLONASE Place 2 sprays into both nostrils daily.   gabapentin 300 MG capsule Commonly known as:  NEURONTIN Take 2 capsules (600 mg total) by mouth 2  (two) times daily.   glipiZIDE 10 MG tablet Commonly known as:  GLUCOTROL TAKE 1 TABLET BY MOUTH 2 TIMES DAILY BEFORE A MEAL.   Insulin Glargine 100 UNIT/ML Solostar Pen Commonly known as:  LANTUS SOLOSTAR Inject 18 Units into the skin 2 (two) times daily. What changed:  how much to take   meloxicam 7.5 MG tablet Commonly known as:  MOBIC Take 1 tablet (7.5 mg total) by mouth 2 (two) times daily. X 5 days then prn   methocarbamol 500 MG tablet Commonly known as:  ROBAXIN Take 1 tablet (500 mg total) by mouth every 6 (six) hours as needed for muscle spasms. And headache X5 days then prn   pantoprazole 40 MG tablet Commonly known as:  PROTONIX Take 1 tablet (40 mg total) by mouth daily.   traZODone 50 MG tablet Commonly known as:  DESYREL Take 1 tablet (50 mg total) by mouth at bedtime as needed for sleep.   TRUEPLUS PEN NEEDLES 31G X 6 MM Misc Generic drug:  Insulin Pen Needle USE FOR NIGHTLY INSULIN INJECTIONS       Disposition and follow-up:   Ms.Brandi Bryant was discharged from [facility] in Good condition.  At the hospital follow up visit please address:  1.   TIA -Patient presented with acute onset left sided facial numbness, which had resolved by time of discharge. She declined tPA intervention -MRI was negative  for acute CVA.   -MRA head did demonstrate severe bilateral intracranial ICA stenosis - please help facilitate referral to vascular surgery or neurosurgery for evaluation and need for intervention  -She was discharge don ASA 325 mg daily - please ensure patient is taking the appropriate dose of aspirin  -Continued on Lipitor 40 mg daily - lipid panel was within normal limits -Please ensure patient is going to follow up with [facility], Dr. Leonie Man in 1-2 weeks following her discharge from the hospital   Bilateral Intracranial ICA Stenosis  -Demonstrated on MRA -Please help facilitate referral to vascular  surgery or neurosurgery for evaluation and need for intervention  -Carotid dopplers with preliminary finding of bilateral 1-39% ICA stenosis, antegrade vertebral flow.  Type 2 DM -Repeat Hemoglobin A1c 6.5.  -Resumed home medications at discharge- Lantus 18 units BID and glipizide 10 mg BID.  -No changes were made to DM medication regimen  Hypertension -Normotensive throughout hospitalization -Home meds to be resumed at discharge include amlodipine 5 mg and coreg 12.5 mg BID -Losartan discontinued as patient had been complaining of dry cough -Please re-evaluate the patient's blood pressure and consider alternative ace/arb that is less associated with cough -Please evaluate for resolution or persistence of of cough  Cough  -Initial concern was for ACE/ARB induced cough - Losartan was discontinued at discharge, please re-evaluate symptoms and blood pressure -Cough may also be secondary to uncontrolled GERD, as it occurs as night and is associated with heartburn and bad taste in the mouth -Continued Protonix 40 mg daily at discharge  -If symptoms persist she may require further outpatient evaluation, such as EGD   2.  Labs / imaging needed at time of follow-up: none  3.  Pending labs/ test needing follow-up: none   Follow-up Appointments: Follow-up Information    [facility]. Call in 1 week(s).   Contact information: [address]     [address] [place] [address] [place]. Schedule an appointment as soon as possible for a visit in 1 week(s).   Contact information: [place] [phone] [place] [facility] Course by problem list: Principal Problem:   TIA (transient ischemic attack) Active Problems:   HTN (hypertension)   Diabetes mellitus ([place])   Hyperlipidemia   Headache   1. Transient Ischemic Attack  [name] presented to  the [facility] with acute onset left sided facial numbness and tingling. On arrival, the patient's vital signs were stable. She was given 650 mg of aspirin. CT head was negative for acute intracranial abnormality. She was offered tPA, but declined after risks and benefits were discussed with her. MRI brain was negative for acute CVA. MRA of the brain demonstrated severe stenosis of both distal internal carotid arteries involving the cavernous and clinoid segments. Physical and occupation therapy evalauted the patient, and recommended no further PT/OT management. Carotid dopplers with preliminary finding of bilateral 1-39% ICA stenosis, and antegrade vertebral flow. Transthoracic echocardiogram with estimated EF of 60-65%, no structural abnormalities of PFO identified. Lipid panel was within normal limits and she was resumed on 40 mg of Lipitor daily. The patient's symptoms had resolved at time of discharge. She is to follow up with [facility] 1-2 weeks after discharge. Please consider referral to vascular surgery or neurosurgery for evaluation of intracranial ICA stenosis.   2. Type 2  Diabetes Mellitus Repeat Hemoglobin A1C 6.5. The patient's blood glucose levels met inpatient goals while hospitalized. She was placed on 10 units of Lantus BID and SSI coverage. She was discharged on her home medication regimen of Lantus 18 units BID and Glipizide 10 mg daily. No changes were made to her medications.   3. Essential Hypertension  The patient remained normotensive for the duration of her hospitalization. Her blood pressure medications were held as she was normotensive. Initially, prior to MRI results they were held to allow for permissive hypertension. She was discharged on amlodipine 5 mg and coreg 12.5 mg BID. Losartan was discontinued at discharge due to patient's complaints about several weeks of chronic dry cough. Please re-evaluate patient's symptoms and consider a  different ACE/ARB with less association with cough.   4. Cough Patient brought this to our attention on day of discharge. Describes as dry cough, worse at night, sometimes associated with reflux symptoms. Patient on protonix 40 mg daily. If cough continues despite Losartan discontinuation consider poorly controlled GERD, which would be an unexpected response given protonix 40 mg BID. Could consider EGD for further evaluation if clinically warranted.   Discharge Vitals:   BP (!) 122/54 (BP Location: Right Arm)   Pulse 60   Temp 98.2 F (36.8 C) (Oral)   Resp 18   Ht 5' (1.524 m)   Wt 153 lb (69.4 kg)   SpO2 (!) 66%   BMI 29.88 kg/m   Pertinent Labs, Studies, and Procedures:  BMP Latest Ref Rng & Units 03/30/2018 03/30/2018 01/25/2018  Glucose 65 - 99 mg/dL 188(H) 190(H) 215(H)  BUN 6 - 20 mg/dL '17 16 10  ' Creatinine 0.44 - 1.00 mg/dL 0.90 0.93 0.74  BUN/Creat Ratio 12 - 28 - - -  Sodium 135 - 145 mmol/L 143 143 140  Potassium 3.5 - 5.1 mmol/L 3.9 3.9 3.5  Chloride 101 - 111 mmol/L 107 110 105  CO2 22 - 32 mmol/L - 26 25  Calcium 8.9 - 10.3 mg/dL - 9.4 9.4   CBC Latest Ref Rng & Units 03/30/2018 03/30/2018 01/25/2018  WBC 4.0 - 10.5 K/uL - 7.3 6.8  Hemoglobin 12.0 - 15.0 g/dL 13.3 13.6 14.2  Hematocrit 36.0 - 46.0 % 39.0 39.3 40.9  Platelets 150 - 400 K/uL - 167 179   Lipid Panel     Component Value Date/Time   CHOL 107 03/31/2018 0348   CHOL 130 11/28/2017 0937   TRIG 167 (H) 03/31/2018 0348   HDL 41 03/31/2018 0348   HDL 40 11/28/2017 0937   CHOLHDL 2.6 03/31/2018 0348   VLDL 33 03/31/2018 0348   LDLCALC 33 03/31/2018 0348   LDLCALC 56 11/28/2017 0937   CT HEAD CODE STROKE WO CONTRAST FINDINGS: Brain: No evidence for acute infarction, hemorrhage, mass lesion, hydrocephalus, or extra-axial fluid. Normal for age cerebral volume. No white matter disease. Old LEFT occipital infarct.  Vascular: Calcification of the cavernous internal carotid arteries consistent with  cerebrovascular atherosclerotic disease. No signs of intracranial large vessel occlusion.  Skull: Calvarium intact.  Sinuses/Orbits: No layering sinus fluid or significant opacity. Negative orbits.  Other: No mastoid fluid.  Scalp soft tissues unremarkable.  ASPECTS [facility] Stroke Program Early CT Score)  - Ganglionic level infarction (caudate, lentiform nuclei, internal capsule, insula, M1-M3 cortex): 7  - Supraganglionic infarction (M4-M6 cortex): 3  Total score (0-10 with 10 being normal): 10  IMPRESSION: 1. Chronic changes as described.  No acute infarct or bleed. 2. ASPECTS is 10.  MR BRAIN  and MRA HEAD WO CONTRAST IMPRESSION: 1. No acute infarct. 2. Mild findings of chronic microvascular ischemia and old left occipital lobe infarct. 3. Severe stenosis of both distal internal carotid arteries involving the cavernous and clinoid segments. 4. No other intracranial stenosis.  Transthoracic Echocardiogram Study Conclusions - Left ventricle: The cavity size was normal. Systolic function was   normal. The estimated ejection fraction was in the range of 60%   to 65%. Wall motion was normal; there were no regional wall   motion abnormalities. Left ventricular diastolic function   parameters were normal. - Atrial septum: No defect or patent foramen ovale was identified.   Discharge Instructions: Discharge Instructions    Call MD for:  difficulty breathing, headache or visual disturbances   Complete by:  As directed    Call MD for:  persistant dizziness or light-headedness   Complete by:  As directed    Call MD for:  persistant nausea and vomiting   Complete by:  As directed    Diet - low sodium heart healthy   Complete by:  As directed    Discharge instructions   Complete by:  As directed    Please discharge after completion of echocardiogram.   [name],   You did not have a stroke. I believe you had a mini stroke called a transient ischemic attack.  Imaging of the vessels in your brain did reveal narrowing of two of the vessels, which may be contributing to some of your symptoms. Please follow up with [facility] in 1-2 weeks after discharge. To prevent future strokes please take 325 mg of aspirin daily. This was increased from your prior dose of 81 mg daily.   Continue to take all of your blood pressure medications and diabetes medications you were taking prior to hospitalization. This is also very important for preventing strokes. Follow up with your primary doctor at [facility] within one week of discharge from the hospital.  If you continue to have cough and heartburn at night you could try taking your Protonix several hours prior to bedtime.  I will call you with the results of the ultrasound of your heart.   Increase activity slowly   Complete by:  As directed       Signed: Melanee Spry, MD 04/02/2018, 10:47 AM   Pager: [phone]

## 2018-04-02 NOTE — Telephone Encounter (Signed)
  Reason for call:   I placed an outgoing call to the daughter of Ms. Laila Zamora-Quintanilla at 10:44 AM regarding results of her transthoracic echocardiogram. Her daughter speaks english and is the patient's medical contact. I spoke directly to the patient's daughter Ms. Jeanelle Malling regarding the results of her echocardiogram.    Assessment/ Plan:   TTE showed EF 60-65%, no pfo, no structural abnormality.    Melanee Spry, MD   04/02/2018, 10:44 AM

## 2018-04-03 ENCOUNTER — Encounter: Payer: Self-pay | Admitting: Family Medicine

## 2018-04-03 ENCOUNTER — Ambulatory Visit: Payer: Self-pay | Attending: Family Medicine | Admitting: Family Medicine

## 2018-04-03 VITALS — BP 127/77 | HR 56 | Temp 98.1°F | Ht 60.0 in | Wt 153.4 lb

## 2018-04-03 DIAGNOSIS — Z7982 Long term (current) use of aspirin: Secondary | ICD-10-CM | POA: Insufficient documentation

## 2018-04-03 DIAGNOSIS — Z794 Long term (current) use of insulin: Secondary | ICD-10-CM | POA: Insufficient documentation

## 2018-04-03 DIAGNOSIS — Z853 Personal history of malignant neoplasm of breast: Secondary | ICD-10-CM | POA: Insufficient documentation

## 2018-04-03 DIAGNOSIS — Z79899 Other long term (current) drug therapy: Secondary | ICD-10-CM | POA: Insufficient documentation

## 2018-04-03 DIAGNOSIS — I69898 Other sequelae of other cerebrovascular disease: Secondary | ICD-10-CM | POA: Insufficient documentation

## 2018-04-03 DIAGNOSIS — I6529 Occlusion and stenosis of unspecified carotid artery: Secondary | ICD-10-CM

## 2018-04-03 DIAGNOSIS — G459 Transient cerebral ischemic attack, unspecified: Secondary | ICD-10-CM

## 2018-04-03 DIAGNOSIS — F329 Major depressive disorder, single episode, unspecified: Secondary | ICD-10-CM | POA: Insufficient documentation

## 2018-04-03 DIAGNOSIS — I10 Essential (primary) hypertension: Secondary | ICD-10-CM | POA: Insufficient documentation

## 2018-04-03 DIAGNOSIS — Z09 Encounter for follow-up examination after completed treatment for conditions other than malignant neoplasm: Secondary | ICD-10-CM | POA: Insufficient documentation

## 2018-04-03 DIAGNOSIS — E7800 Pure hypercholesterolemia, unspecified: Secondary | ICD-10-CM | POA: Insufficient documentation

## 2018-04-03 DIAGNOSIS — I6523 Occlusion and stenosis of bilateral carotid arteries: Secondary | ICD-10-CM | POA: Insufficient documentation

## 2018-04-03 DIAGNOSIS — G44209 Tension-type headache, unspecified, not intractable: Secondary | ICD-10-CM | POA: Insufficient documentation

## 2018-04-03 DIAGNOSIS — E1142 Type 2 diabetes mellitus with diabetic polyneuropathy: Secondary | ICD-10-CM | POA: Insufficient documentation

## 2018-04-03 DIAGNOSIS — E785 Hyperlipidemia, unspecified: Secondary | ICD-10-CM | POA: Insufficient documentation

## 2018-04-03 DIAGNOSIS — K219 Gastro-esophageal reflux disease without esophagitis: Secondary | ICD-10-CM | POA: Insufficient documentation

## 2018-04-03 DIAGNOSIS — I2510 Atherosclerotic heart disease of native coronary artery without angina pectoris: Secondary | ICD-10-CM | POA: Insufficient documentation

## 2018-04-03 DIAGNOSIS — Z7951 Long term (current) use of inhaled steroids: Secondary | ICD-10-CM | POA: Insufficient documentation

## 2018-04-03 HISTORY — DX: Occlusion and stenosis of unspecified carotid artery: I65.29

## 2018-04-03 MED ORDER — ASPIRIN 325 MG PO TBEC: 325.0000 mg | DELAYED_RELEASE_TABLET | Freq: Every day | ORAL | 6 refills | Status: DC

## 2018-04-03 NOTE — Patient Instructions (Signed)
Ataque isqumico transitorio (Transient Ischemic Attack) Un ataque isqumico transitorio (AIT) es un "ictus de advertencia" que causa sntomas similares al ictus. El AIT no causa un dao duradero en el cerebro. Los sntomas pueden aparecer rpido y no duran Management consultant. Es Photographer los sntomas de un AIT y Tax inspector. De esta [place], se puede prevenir un ictus o la muerte. [place] los medicamentos solamente como se lo haya indicado el mdico. Asegrese de comprender todas las indicaciones.  Puede que tenga que tomar aspirina o un medicamento llamado warfarina. Es necesario que tome la warfarina exactamente como se lo indicaron. ? Tomar demasiada o muy poca warfarina es peligroso. Debe hacerse anlisis de sangre con la frecuencia indicada por el mdico. Un anlisis de sangre TP mide el tiempo que tarda la sangre en coagularse. El TP se [place] para calcular otro valor llamado INR. Los resultados de esos anlisis ayudan a que el mdico ajuste la dosis de warfarina y se asegure de que est tomando la cantidad Advice worker. ? La alimentacin puede causar problemas con la warfarina y [employer] de los anlisis de [place]. Esto es vlido para los alimentos con alto contenido de vitaminaK. Coma la misma cantidad de alimentos con alto contenido de [employer]. Los alimentos con alto contenido de vitamina K incluyen espinaca, col rizada, brcoli, repollo, acelga y nabos verdes, repollitos de [place], guisantes, coliflor, algas y perejil. Otros alimentos ricos en vitaminaK incluyen hgado de cerdo y de [place], t verde y aceite de soja. Coma la misma cantidad de alimentos con alto contenido de [employer]. Evite hacer cambios importantes en la dieta. Informe a su mdico antes de cambiar su dieta. Hable con un especialista en alimentacin (nutricionista) si tiene preguntas. ? Muchos medicamentos pueden causar problemas con la warfarina y afectar los  resultados de los anlisis de INR y PT. Informe a su mdico [employer] toma. Esto incluye vitaminas y suplementos nutricionales. No tome ni deje de tomar ningn medicamento recetado o de venta libre, excepto si el mdico se lo indica. ? La warfarina puede causar ms hematomas o sangrado. Ejerza presin sobre cualquier herida por ms tiempo que lo habitual. Hable con su mdico sobre otros efectos secundarios de la warfarina. ? Evite realizar actividades deportivas que puedan causar lesiones o sangrado. ? Sea cuidadoso al [employer], [employer] hilo dental o manipular objetos filosos. ? Evite el alcohol o beba muy poco mientras toma warfarina. Informe a su mdico si cambia la cantidad de alcohol que bebe. ? Informe a su dentista y a otros mdicos que toma warfarina antes de someterse a cualquier procedimiento.  Siga su programa de alimentacin, segn lo indicado, si tiene uno.  Mantenga un peso saludable.  Mantngase activo. Realice al menos [id number] de [place] todos o [employer].  No consuma ningn producto que contenga tabaco, lo que incluye cigarrillos, tabaco de Higher education careers adviser o Psychologist, sport and exercise. Si necesita ayuda para dejar de fumar, consulte al mdico.  Limite el consumo de alcohol a no ms de 1 medida por da si es mujer y no est Music therapist, y 2 medidas si es hombres. Una medida equivale a 12onzas de cerveza, 5onzas de vino o 1onzas de bebidas alcohlicas de alta graduacin.  No consuma drogas.  Haga de su hogar un lugar seguro para evitar cadas. Puede hacer lo siguiente: ? Colocar barras para sostn en la habitacin y el bao. ? Elevar el inodoro. ? Colocar un  asiento en la ducha.  Concurra a todas las visitas de control como se lo haya indicado el mdico. Esto es importante. SOLICITE AYUDA SI:  Sufre cambios de personalidad.  Presenta dificultad para tragar.  Tiene visin doble.  Tiene mareos.  Tiene fiebre. SOLICITE AYUDA DE INMEDIATO  SI: Estos sntomas pueden indicar una emergencia. No espere hasta que los sntomas desaparezcan. Solicite atencin mdica de inmediato. Comunquese con el servicio de emergencias de su localidad (911 en los [place]). No conduzca por sus propios medios [employer].  Siente debilidad repentina o pierde la sensibilidad (tiene adormecimiento), especialmente en un lado del cuerpo. Esto puede afectar lo siguiente: ? El rostro. ? El brazo. ? La pierna.  Tiene dificultad repentina para caminar.  Tiene dificultad repentina para mover los brazos o las piernas.  Se siente repentinamente confundido.  Presenta dificultad para hablar.  Tiene dificultad para comprender.  Presenta, sbitamente, dificultad para ver de uno o ambos ojos.  Pierde el equilibrio.  Tiene movimientos anormales.  Siente sbitamente un dolor de cabeza muy intenso que no tiene causa aparente.  Le aparece dolor en el pecho.  Los latidos cardacos son inestables.  No est consciente, en parte o totalmente, de lo que ocurre a su alrededor. ASEGRESE DE QUE:  Comprende estas instrucciones.  Controlar su afeccin.  Recibir ayuda de inmediato si no mejora o si empeora. Esta informacin no tiene Marine scientist el consejo del mdico. Asegrese de hacerle al mdico cualquier pregunta que tenga. Document Released: 10/26/2009 Document Revised: 11/28/2014 Document Reviewed: 02/12/2014 Elsevier Interactive Patient Education  2018 [employer].

## 2018-04-03 NOTE — Progress Notes (Signed)
Subjective:  Patient ID: Brandi Bryant, female    DOB: Apr 07, 1956  Age: 62 y.o. MRN: [id number]  CC: Hospitalization Follow-up   HPI Brandi Bryant  is a 62 year old female with a history of GERD, hypertension, hyperlipidemia, type 2 diabetes mellitus (A1c 7.4) previous CVA with recent hospitalization for TIA from 03/30/2018 through 03/31/2018 after she had presented to [facility] with left facial numbness; she declined TPA intervention.;   Work-up included CT head without contrast MRI/MRA of the head without contrast with findings as below: CT head without contrast IMPRESSION: 1. Chronic changes as described.  No acute infarct or bleed. 2. ASPECTS is 10. These results were communicated to Dr. Cheral Marker at 2:59 pmon 5/10/2019by text page via the [facility] messaging system.  MRI/MRA head without contrast IMPRESSION: 1. No acute infarct. 2. Mild findings of chronic microvascular ischemia and old left occipital lobe infarct. 3. Severe stenosis of both distal internal carotid arteries involving the cavernous and clinoid segments. 4. No other intracranial stenosis.  Carotid Dopplers were negative unremarkable, echocardiogram revealed EF of 60 to 65%, normal wall motion.She was treated with 325 mg of aspirin, statin and her home medications continued.  Recommendation was to follow-up with neurology and vascular surgery post discharge.  Today she is accompanied by her daughter and complains of persisting left facial numbness and she has a headache.  Her losartan had been discontinued during hospitalization due to cough however she informs me she has still been taking it as she needs it for better control of her blood pressure.  She states she has not had much of a cough. Her appointment with neurology comes up on 04/30/2018 but she is yet to make an appointment with vascular surgery.  Past Medical History:  Diagnosis Date  . Abdominal pain   . Arthralgia 08/13/2013  .  Bell palsy 2006  . Bell's palsy   . Breast cancer ([place]) 2009   Left Breast Cancer  . Chest pain 09/30/2013  . Cholelithiasis   . Colon cancer screening 10/27/2015  . Complication of anesthesia    " tubal ligation, in [place]"  had weakness, couldnt stand up the next day, was given pills for oxygen for Brain."  1 month after I was having loss of attentiviness, still have to focus  carefully.   . Coronary artery disease   . Depression   . Diabetes mellitus    Type II  . Dyspnea    at times- when air conditioner is runnimg, heat also   . Encounter for screening colonoscopy 10/30/2015  . Hepatic steatosis   . History of breast cancer 06/01/2012  . HTN (hypertension)   . Personal history of chemotherapy 2009   Left Breast Cancer  . Personal history of radiation therapy 2009   Left Breast Cancer    Past Surgical History:  Procedure Laterality Date  . ANKLE ARTHROSCOPY Right 12/14/2016   Procedure: Right Ankle Arthroscopic Debridement;  Surgeon: Newt Minion, MD;  Location: [place];  Service: Orthopedics;  Laterality: Right;  . Arm surgery     Left tendon lengthen  . BREAST LUMPECTOMY Left 2009  . CHOLECYSTECTOMY N/A 12/09/2015   Procedure: LAPAROSCOPIC CHOLECYSTECTOMY WITH INTRAOPERATIVE CHOLANGIOGRAM;  Surgeon: Greer Pickerel, MD;  Location: [place];  Service: General;  Laterality: N/A;  . LEFT HEART CATHETERIZATION WITH CORONARY ANGIOGRAM N/A 10/30/2013   Procedure: LEFT HEART CATHETERIZATION WITH CORONARY ANGIOGRAM;  Surgeon: Josue Hector, MD;  Location: [facility] CATH LAB;  Service: Cardiovascular;  Laterality: N/A;  .  porta cath Right   . Removal of Porta cath Right   . TUBAL LIGATION      Allergies  Allergen Reactions  . Gadolinium Derivatives Nausea And Vomiting    Code: VOM, Desc: Pt began vomiting 45 sec after MRI contrast injection of Multihance, Onset Date: [id number]       Outpatient Medications Prior to Visit  Medication Sig Dispense Refill  . albuterol (PROVENTIL  HFA;VENTOLIN HFA) 108 (90 Base) MCG/ACT inhaler Inhale 1-2 puffs into the lungs every 6 (six) hours as needed for wheezing or shortness of breath. 54 g 3  . amLODipine (NORVASC) 5 MG tablet Take 1 tablet (5 mg total) by mouth daily. 90 tablet 0  . atorvastatin (LIPITOR) 40 MG tablet Take 1 tablet (40 mg total) by mouth daily. 30 tablet 6  . carvedilol (COREG) 12.5 MG tablet TAKE 1 TABLET BY MOUTH 2 TIMES DAILY WITH A MEAL. 60 tablet 6  . cetirizine (ZYRTEC) 10 MG tablet Take 1 tablet (10 mg total) by mouth daily. 30 tablet 1  . fluticasone (FLONASE) 50 MCG/ACT nasal spray Place 2 sprays into both nostrils daily. 16 g 6  . gabapentin (NEURONTIN) 300 MG capsule Take 2 capsules (600 mg total) by mouth 2 (two) times daily. 60 capsule 6  . glipiZIDE (GLUCOTROL) 10 MG tablet TAKE 1 TABLET BY MOUTH 2 TIMES DAILY BEFORE A MEAL. 60 tablet 6  . Insulin Glargine (LANTUS SOLOSTAR) 100 UNIT/ML Solostar Pen Inject 18 Units into the skin 2 (two) times daily. (Patient taking differently: Inject 20 Units into the skin 2 (two) times daily. ) 15 mL 3  . meloxicam (MOBIC) 7.5 MG tablet Take 1 tablet (7.5 mg total) by mouth 2 (two) times daily. X 5 days then prn 60 tablet 0  . methocarbamol (ROBAXIN) 500 MG tablet Take 1 tablet (500 mg total) by mouth every 6 (six) hours as needed for muscle spasms. And headache X5 days then prn 90 tablet 0  . pantoprazole (PROTONIX) 40 MG tablet Take 1 tablet (40 mg total) by mouth daily. 30 tablet 6  . traZODone (DESYREL) 50 MG tablet Take 1 tablet (50 mg total) by mouth at bedtime as needed for sleep. 30 tablet 6  . TRUEPLUS PEN NEEDLES 31G X 6 MM MISC USE FOR NIGHTLY INSULIN INJECTIONS 100 each 11  . aspirin 325 MG EC tablet Take 1 tablet (325 mg total) by mouth daily. 30 tablet 0   Facility-Administered Medications Prior to Visit  Medication Dose Route Frequency Provider Last Rate Last Dose  . aspirin tablet 325 mg  325 mg Oral Daily Chari Manning A, NP   325 mg at 09/02/15 1628      ROS Review of Systems  Constitutional: Negative for activity change, appetite change and fatigue.  HENT: Negative for congestion, sinus pressure and sore throat.   Eyes: Negative for visual disturbance.  Respiratory: Negative for cough, chest tightness, shortness of breath and wheezing.   Cardiovascular: Negative for chest pain and palpitations.  Gastrointestinal: Negative for abdominal distention, abdominal pain and constipation.  Endocrine: Negative for polydipsia.  Genitourinary: Negative for dysuria and frequency.  Musculoskeletal: Negative for arthralgias and back pain.  Skin: Negative for rash.  Neurological: Positive for numbness. Negative for tremors and light-headedness.  Hematological: Does not bruise/bleed easily.  Psychiatric/Behavioral: Negative for agitation and behavioral problems.    Objective:  BP 127/77   Pulse (!) 56   Temp 98.1 F (36.7 C) (Oral)   Ht 5' (1.524 m)  Wt 153 lb 6.4 oz (69.6 kg)   SpO2 97%   BMI 29.96 kg/m   BP/Weight 04/03/2018 03/31/2018 9/60/4540  Systolic BP 981 191 -  Diastolic BP 77 54 -  Wt. (Lbs) 153.4 - 153  BMI 29.96 - 29.88      Physical Exam  Constitutional: She is oriented to person, place, and time. She appears well-developed and well-nourished.  Cardiovascular: Normal rate, normal heart sounds and intact distal pulses.  No murmur heard. Pulmonary/Chest: Effort normal and breath sounds normal. She has no wheezes. She has no rales. She exhibits no tenderness.  Abdominal: Soft. Bowel sounds are normal. She exhibits no distension and no mass. There is no tenderness.  Musculoskeletal: Normal range of motion.  Neurological: She is alert and oriented to person, place, and time. She has normal strength.  Slight obliteration of left nasolabial fold with slight deviation of angle of the mouth to the left  Skin: Skin is warm and dry.  Psychiatric: She has a normal mood and affect.     Lab Results  Component Value Date    HGBA1C 6.5 (H) 03/31/2018    Assessment & Plan:   1. TIA (transient ischemic attack) With previous history of CVA She has residual left facial numbness and slight left facial nerve palsy Continue aspirin 325 mg, statin and risk factor modification  2. Tension-type headache, not intractable, unspecified chronicity pattern Advised to use Tylenol as needed Could be post stroke headaches  3. Pure hypercholesterolemia Controlled on statin Low-cholesterol diet  4. Essential hypertension Controlled Resume losartan as she has been taking it at home in addition to carvedilol and amlodipine  5. Stenosis of both internal carotid arteries - Ambulatory referral to Vascular Surgery  6. Diabetic polyneuropathy associated with type 2 diabetes mellitus ([place]) Controlled with A1c of 6.5 Continue Lantus and glipizide Continue gabapentin Diabetic diet  Meds ordered this encounter  Medications  . aspirin 325 MG EC tablet    Sig: Take 1 tablet (325 mg total) by mouth daily.    Dispense:  30 tablet    Refill:  6    Follow-up: Return in about 1 month (around 05/04/2018) for Follow-up of chronic medical conditions.   Charlott Rakes MD

## 2018-04-30 ENCOUNTER — Ambulatory Visit (INDEPENDENT_AMBULATORY_CARE_PROVIDER_SITE_OTHER): Payer: Self-pay | Admitting: Neurology

## 2018-04-30 ENCOUNTER — Encounter

## 2018-04-30 ENCOUNTER — Encounter: Payer: Self-pay | Admitting: Neurology

## 2018-04-30 VITALS — BP 134/68 | HR 59 | Ht 60.0 in | Wt 154.0 lb

## 2018-04-30 DIAGNOSIS — G459 Transient cerebral ischemic attack, unspecified: Secondary | ICD-10-CM

## 2018-04-30 DIAGNOSIS — R531 Weakness: Secondary | ICD-10-CM

## 2018-04-30 MED FILL — AMLODIPINE BESYLATE 5 MG TA: 5 | 30 days supply | Qty: 30 | Fill #4

## 2018-04-30 MED FILL — $LANTUS SOLOSTAR 100 UNITS/: 100 | 25 days supply | Qty: 9 | Fill #4

## 2018-04-30 MED FILL — TRUEPLUS PEN NDL 31G X 1/4: 31G X 6 MM | 25 days supply | Qty: 100 | Fill #1

## 2018-04-30 MED FILL — GABAPENTIN 300 MG CAPSULE: 300 | 15 days supply | Qty: 60 | Fill #4

## 2018-04-30 MED FILL — glipiZIDE 10 MG TABS: 10 | 30 days supply | Qty: 60 | Fill #4

## 2018-04-30 MED FILL — CARVEDILOL 12.5 MG TABLET: 12.5 | 30 days supply | Qty: 60 | Fill #4

## 2018-04-30 MED FILL — ATORVASTATIN CALCIUM 40 MG: 40 | 30 days supply | Qty: 30 | Fill #3

## 2018-04-30 MED FILL — TRUEPLUS PEN NDL 31G X 1/4": 31G X 6 MM | 25 days supply | Qty: 100 | Fill #1

## 2018-04-30 NOTE — Patient Instructions (Addendum)
Brandi Bryant isqumico transitorio (Transient Ischemic Attack) Un Brandi Bryant isqumico transitorio (AIT) es un "ictus de advertencia" que causa sntomas similares al ictus. El AIT no causa un dao duradero en el cerebro. Los sntomas pueden aparecer rpido y no duran Management consultant. Es Photographer los sntomas de un AIT y Tax inspector. De esta [name], se puede prevenir un ictus o la muerte. [name] los medicamentos solamente como se lo haya indicado el mdico. Asegrese de comprender todas las indicaciones.  Puede que tenga que tomar aspirina o un medicamento llamado warfarina. Es necesario que tome la warfarina exactamente como se lo indicaron. ? Tomar demasiada o muy poca warfarina es peligroso. Debe hacerse anlisis de sangre con la frecuencia indicada por el mdico. Un anlisis de sangre TP mide el tiempo que tarda la sangre en coagularse. El TP se [place] para calcular otro valor llamado INR. Los resultados de esos anlisis ayudan a que el mdico ajuste la dosis de warfarina y se asegure de que est tomando la cantidad Advice worker. ? La alimentacin puede causar problemas con la warfarina y [employer] de los anlisis de [place]. Esto es vlido para los alimentos con alto contenido de vitaminaK. Coma la misma cantidad de alimentos con alto contenido de [employer]. Los alimentos con alto contenido de vitamina K incluyen espinaca, col rizada, brcoli, repollo, acelga y nabos verdes, repollitos de [place], guisantes, coliflor, algas y perejil. Otros alimentos ricos en vitaminaK incluyen hgado de cerdo y de [place], t verde y aceite de soja. Coma la misma cantidad de alimentos con alto contenido de [employer]. Evite hacer cambios importantes en la dieta. Informe a su mdico antes de cambiar su dieta. Hable con un especialista en alimentacin (nutricionista) si tiene preguntas. ? Muchos medicamentos pueden causar problemas con la warfarina y afectar los  resultados de los anlisis de INR y PT. Informe a su mdico [employer] toma. Esto incluye vitaminas y suplementos nutricionales. No tome ni deje de tomar ningn medicamento recetado o de venta libre, excepto si el mdico se lo indica. ? La warfarina puede causar ms hematomas o sangrado. Ejerza presin sobre cualquier herida por ms tiempo que lo habitual. Hable con su mdico sobre otros efectos secundarios de la warfarina. ? Evite realizar actividades deportivas que puedan causar lesiones o sangrado. ? Sea cuidadoso al [employer], [employer] hilo dental o manipular objetos filosos. ? Evite el alcohol o beba muy poco mientras toma warfarina. Informe a su mdico si cambia la cantidad de alcohol que bebe. ? Informe a su dentista y a otros mdicos que toma warfarina antes de someterse a cualquier procedimiento.  Siga su programa de alimentacin, segn lo indicado, si tiene uno.  Mantenga un peso saludable.  Mantngase activo. Realice al menos [id number] de [place] todos o [employer].  No consuma ningn producto que contenga tabaco, lo que incluye cigarrillos, tabaco de Higher education careers adviser o Psychologist, sport and exercise. Si necesita ayuda para dejar de fumar, consulte al mdico.  Limite el consumo de alcohol a no ms de 1 medida por da si es mujer y no est Music therapist, y 2 medidas si es hombres. Una medida equivale a 12onzas de cerveza, 5onzas de vino o 1onzas de bebidas alcohlicas de alta graduacin.  No consuma drogas.  Haga de su hogar un lugar seguro para evitar cadas. Puede hacer lo siguiente: ? Colocar barras para sostn en la habitacin y el bao. ? Elevar el inodoro. ? Colocar un  asiento en la ducha.  Concurra a todas las visitas de control como se lo haya indicado el mdico. Esto es importante. SOLICITE AYUDA SI:  Sufre cambios de personalidad.  Presenta dificultad para tragar.  Tiene visin doble.  Tiene mareos.  Tiene fiebre. SOLICITE AYUDA DE INMEDIATO  SI: Estos sntomas pueden indicar una emergencia. No espere hasta que los sntomas desaparezcan. Solicite atencin mdica de inmediato. Comunquese con el servicio de emergencias de su localidad (911 en los [place]). No conduzca por sus propios medios [employer].  Siente debilidad repentina o pierde la sensibilidad (tiene adormecimiento), especialmente en un lado del cuerpo. Esto puede afectar lo siguiente: ? El rostro. ? El brazo. ? La pierna.  Tiene dificultad repentina para caminar.  Tiene dificultad repentina para mover los brazos o las piernas.  Se siente repentinamente confundido.  Presenta dificultad para hablar.  Tiene dificultad para comprender.  Presenta, sbitamente, dificultad para ver de uno o ambos ojos.  Pierde el equilibrio.  Tiene movimientos anormales.  Siente sbitamente un dolor de cabeza muy intenso que no tiene causa aparente.  Le aparece dolor en el pecho.  Los latidos cardacos son inestables.  No est consciente, en parte o totalmente, de lo que ocurre a su alrededor. ASEGRESE DE QUE:  Comprende estas instrucciones.  Controlar su afeccin.  Recibir ayuda de inmediato si no mejora o si empeora. Esta informacin no tiene Marine scientist el consejo del mdico. Asegrese de hacerle al mdico cualquier pregunta que tenga. Document Released: 10/26/2009 Document Revised: 11/28/2014 Document Reviewed: 02/12/2014 Elsevier Interactive Patient Education  2018 [place] del ictus (Stroke Prevention) Algunos problemas de salud y ciertas conductas favorecen el ictus. A continuacin se indican algunas formas de disminuir el riesgo de tener un ictus.  Haga alguna actividad fsica al menos durante 30 minutos todos [place].  No fume. Trate de no rodearse de Geophysicist/field seismologist.  No beba alcohol en exceso. ? No beba ms [place] si es hombre. ? No beba ms de una copa al da si es mujer y no est  embarazada.  Consuma alimentos saludables, como frutas y verduras. Si le indican una dieta especfica, sgala estrictamente.  Mantenga sus niveles de colesterol bajo control con la dieta y medicamentos. Consuma alimentos bajos en grasas saturadas, grasas trans, colesterol y ricos en fibra.  Si tiene diabetes, siga todos los planes dietticos y tome los medicamentos como se le indique.  Pregntele al mdico si necesita tratamiento para disminuir la presin arterial. Si tiene presin arterial alta (hipertensin), siga una dieta y tome los [employer] se lo haya indicado el mdico.  Si usted tiene entre 18 y 39 aos, debe medirse la presin arterial cada 3 a 5 aos. Si usted tiene 40 aos o ms, debe medirse la presin arterial [employer].  Mantenga un peso saludable. Consuma alimentos bajos en caloras, sal, grasas saturadas, grasas trans y colesterol.  No consuma drogas.  Evite las pldoras anticonceptivas, si corresponde. Hable con su mdico acerca de los riesgos de tomar pldoras anticonceptivas.  Hable con su mdico si usted tiene problemas de sueo (apnea del sueo).  Tome todos los medicamentos segn las indicaciones de su mdico. ? Es posible que le indiquen que tome aspirina o anticoagulantes. Tome los medicamentos como le indic el mdico. ? Conozca todas las instrucciones de los medicamentos.  Asegrese de tener bajo control cualquier otra enfermedad que tenga. SOLICITE AYUDA DE INMEDIATO SI:  Pierde  repentinamente la sensibilidad (siente adormecimiento) o siente debilidad en el rostro, un brazo o una pierna.  Su cara o prpado se caen hacia un lado.  Se siente sbitamente confundido.  Tiene dificultad para hablar afasia o comprender lo que las [employer].  Presenta dificultad para la visin de uno o ambos ojos.  Tiene dificultad repentina para caminar.  Tiene mareos.  Pierde el equilibrio o sus movimientos son torpes (falta de coordinacin).  Siente  un dolor de cabeza sbito e intenso y no sabe la causa.  Siente un dolor nuevo en el pecho.  Siente un aleteo en el corazn o le falta un latido (ritmo cardaco irregular). No espere para ver si los sntomas desaparecen. Solicite ayuda de inmediato. Comunquese con el servicio de emergencias de su localidad (911 en los [place]). No conduzca por sus propios medios [employer]. Esta informacin no tiene Marine scientist el consejo del mdico. Asegrese de hacerle al mdico cualquier pregunta que tenga. Document Released: 05/08/2012 Document Revised: 11/28/2014 Document Reviewed: 05/10/2013 Elsevier Interactive Patient Education  [employer].

## 2018-04-30 NOTE — Progress Notes (Addendum)
[facility]    Provider:  Dr Jaynee Eagles Referring Provider: Charlott Rakes, MD, [facility]  Primary Care Physician:  [facility]  CC:  TIA  HPI:  Brandi Bryant is a 62 y.o. female here as a referral from Dr. Margarita Rana for TIA.  Past medical history of Bell's palsy, chest pain, coronary artery disease, diabetes type 2, hypertension.  Patient says it was all the left side which is contradictory to the notes but she says she doesn't remember. Son is here and provides information as does interpreter. She is resolved. She has an appointment with vascular surgery on the 18th. She has cone financial assistance. She is on ASA 325mg . Symptoms resolved. She endorses compliance with her medications. No other focal neurologic deficits, associated symptoms, inciting events or modifiable factors.  Reviewed notes, labs and imaging from outside physicians, which showed:  Reviewed hospital notes.  Patient was seen by neurology on Mar 30, 2018.  He presented to the [facility], ED with acute onset of right-sided weakness.  Last [place] normal was earlier work when she suddenly experienced facial numbness.  She also endorsed right-sided weakness.  Code stroke was called.  CT head was negative.  Exam showed decreased right face, arm and leg sensation, intermittent decreased contraction of the right side of face when asked to grimace, right upper and lower extremity drift.  Patient has a reported history of 2 strokes in the past.  She is Spanish-speaking and daughter provides assistance with interpretation.She was changed from 81mg  asa to asa 325mg .  Personally reviewed MRI of the brain which showed no acute infarct.  Showed chronic small vessel disease.  An old left occipital lobe infarct.  Age advanced atrophy.  The MRA showed severe stenosis of both distal internal carotid arteries involving the cavernous and clinoid segments.  hgba1c 6.5, ldl 33,   -Carotid  dopplers with preliminary finding of bilateral 1-39% ICA stenosis, antegrade vertebral flow, reviewed report    Review of Systems: Patient complains of symptoms per HPI as well as the following symptoms: Headache, insomnia, sleepiness, dizziness, feeling cold, flushing, shortness of breath, chest pain, swelling in legs, joint pain, hearing loss, ringing in ears, spinning sensation. Pertinent negatives and positives per HPI. All others negative.   Social History   Socioeconomic History  . Marital status: Single    Spouse name: Not on file  . Number of children: 6  . Years of education: Not on file  . Highest education level: High school graduate  Occupational History  . Not on file  Social Needs  . Financial resource strain: Not on file  . Food insecurity:    Worry: Not on file    Inability: Not on file  . Transportation needs:    Medical: Not on file    Non-medical: Not on file  Tobacco Use  . Smoking status: Never Smoker  . Smokeless tobacco: Never Used  Substance and Sexual Activity  . Alcohol use: No  . Drug use: No  . Sexual activity: Not Currently    Birth control/protection: Surgical  Lifestyle  . Physical activity:    Days per week: 2 days    Minutes per session: 20 min  . Stress: Very much  Relationships  . Social connections:    Talks on phone: More than three times a week    Gets together: More than three times a week    Attends religious service: Never    Active member of club or organization: No  Attends meetings of clubs or organizations: Never    Relationship status: Divorced  . Intimate partner violence:    Fear of current or ex partner: No    Emotionally abused: No    Physically abused: No    Forced sexual activity: No  Other Topics Concern  . Not on file  Social History Narrative   Lives with her daughter   Right handed    Family History  Problem Relation Age of Onset  . Thyroid cancer Sister   . Hypertension Mother   . Diabetes Mother    . Migraines Daughter     Past Medical History:  Diagnosis Date  . Abdominal pain   . Arthralgia 08/13/2013  . Bell palsy 2006  . Bell's palsy   . Breast cancer ([place]) 2009   Left Breast Cancer  . Chest pain 09/30/2013  . Cholelithiasis   . Colon cancer screening 10/27/2015  . Complication of anesthesia    " tubal ligation, in [place]"  had weakness, couldnt stand up the next day, was given pills for oxygen for Brain."  1 month after I was having loss of attentiviness, still have to focus  carefully.   . Coronary artery disease   . Depression   . Diabetes mellitus    Type II  . Dyspnea    at times- when air conditioner is runnimg, heat also   . Encounter for screening colonoscopy 10/30/2015  . Hepatic steatosis   . History of breast cancer 06/01/2012  . HTN (hypertension)   . Personal history of chemotherapy 2009   Left Breast Cancer  . Personal history of radiation therapy 2009   Left Breast Cancer    Past Surgical History:  Procedure Laterality Date  . ANKLE ARTHROSCOPY Right 12/14/2016   Procedure: Right Ankle Arthroscopic Debridement;  Surgeon: Newt Minion, MD;  Location: [facility];  Service: Orthopedics;  Laterality: Right;  . Arm surgery     Left tendon lengthen  . BREAST LUMPECTOMY Left 2009  . CHOLECYSTECTOMY N/A 12/09/2015   Procedure: LAPAROSCOPIC CHOLECYSTECTOMY WITH INTRAOPERATIVE CHOLANGIOGRAM;  Surgeon: Greer Pickerel, MD;  Location: [facility];  Service: General;  Laterality: N/A;  . LEFT HEART CATHETERIZATION WITH CORONARY ANGIOGRAM N/A 10/30/2013   Procedure: LEFT HEART CATHETERIZATION WITH CORONARY ANGIOGRAM;  Surgeon: Josue Hector, MD;  Location: [facility] CATH LAB;  Service: Cardiovascular;  Laterality: N/A;  . porta cath Right   . Removal of Porta cath Right   . TUBAL LIGATION      Current Outpatient Medications  Medication Sig Dispense Refill  . albuterol (PROVENTIL HFA;VENTOLIN HFA) 108 (90 Base) MCG/ACT inhaler Inhale 1-2 puffs into the lungs every 6 (six) hours  as needed for wheezing or shortness of breath. 54 g 3  . amLODipine (NORVASC) 5 MG tablet Take 1 tablet (5 mg total) by mouth daily. 90 tablet 0  . aspirin 325 MG EC tablet Take 1 tablet (325 mg total) by mouth daily. 30 tablet 6  . atorvastatin (LIPITOR) 40 MG tablet Take 1 tablet (40 mg total) by mouth daily. 30 tablet 6  . carvedilol (COREG) 12.5 MG tablet TAKE 1 TABLET BY MOUTH 2 TIMES DAILY WITH A MEAL. 60 tablet 6  . cetirizine (ZYRTEC) 10 MG tablet Take 1 tablet (10 mg total) by mouth daily. 30 tablet 1  . gabapentin (NEURONTIN) 300 MG capsule Take 2 capsules (600 mg total) by mouth 2 (two) times daily. 60 capsule 6  . glipiZIDE (GLUCOTROL) 10 MG tablet TAKE 1 TABLET  BY MOUTH 2 TIMES DAILY BEFORE A MEAL. 60 tablet 6  . Insulin Glargine (LANTUS SOLOSTAR) 100 UNIT/ML Solostar Pen Inject 18 Units into the skin 2 (two) times daily. (Patient taking differently: Inject 20 Units into the skin 2 (two) times daily. ) 15 mL 3  . pantoprazole (PROTONIX) 40 MG tablet Take 1 tablet (40 mg total) by mouth daily. 30 tablet 6  . traZODone (DESYREL) 50 MG tablet Take 1 tablet (50 mg total) by mouth at bedtime as needed for sleep. 30 tablet 6  . TRUEPLUS PEN NEEDLES 31G X 6 MM MISC USE FOR NIGHTLY INSULIN INJECTIONS 100 each 11   Current Facility-Administered Medications  Medication Dose Route Frequency Provider Last Rate Last Dose  . aspirin tablet 325 mg  325 mg Oral Daily Chari Manning A, NP   325 mg at 09/02/15 1628    Allergies as of 04/30/2018 - Review Complete 04/30/2018  Allergen Reaction Noted  . Gadolinium derivatives Nausea And Vomiting 06/12/2009    Vitals: BP 134/68 (BP Location: Right Arm, Patient Position: Sitting)   Pulse (!) 59   Ht 5' (1.524 m)   Wt 154 lb (69.9 kg)   BMI 30.08 kg/m  Last Weight:  Wt Readings from Last 1 Encounters:  04/30/18 154 lb (69.9 kg)   Last Height:   Ht Readings from Last 1 Encounters:  04/30/18 5' (1.524 m)    Physical exam: Exam: Gen: NAD,  obese, well groomed                     CV: RRR, no MRG. No Carotid Bruits. No peripheral edema, warm, nontender Eyes: Conjunctivae clear without exudates or hemorrhage  Neuro: Detailed Neurologic Exam  Speech:    Speech is normal; fluent and spontaneous with normal comprehension.  Cognition:    The patient is oriented to person, place, and time;     recent and remote memory intact;     language fluent;     normal attention, concentration,     fund of knowledge Cranial Nerves:    The pupils are equal, round, and reactive to light. Attempted fundoscopic exam could not visualize Visual fields are full to finger confrontation. Extraocular movements are intact. Trigeminal sensation is intact and the muscles of mastication are normal. The face is symmetric. The palate elevates in the midline. Hearing intact. Voice is normal. Shoulder shrug is normal. The tongue has normal motion without fasciculations.   Coordination:    Normal finger to nose  Gait: normal native gait      Motor Observation:    No asymmetry, no atrophy, and no involuntary movements noted. Tone:    Normal muscle tone.    Posture:    Posture is normal. normal erect    Strength:    Strength appears intact in the upper and lower limbs, no drift, possible mild left-sided prox weakness     Sensation: intact to LT     Reflex Exam:  DTR's:    Deep tendon reflexes in the upper and lower extremities are symmetrical bilaterally.   Toes:    The toes are equiv bilaterally.   Clonus:    Clonus is absent.      Assessment/Plan:  62 y.o. female here as a referral from Dr. Margarita Rana for TIA.  Past medical history of Bell's palsy, chest pain, coronary artery disease, diabetes type 2, hypertension.  She presented with acute ileft ght-sided weakness and left-sided facial numbness likely a brainstem small-vessel stroke.    -  MRI of the brain which showed no acute infarct.  Showed chronic small vessel disease.  An old left  occipital lobe infarct.  Age advanced atrophy.  The MRA showed severe stenosis of both distal internal carotid arteries involving the cavernous and clinoid segments.  - She was changed from asa 81 to 325. Prefer dual antiplatelet therapy for 3 weeks then ASA 325 however the 3 weeks has passed will keep o ASA 325mg .  - she was referred to vascular surgery from [facility], she has an appointment  - continued on Lipitor, asa  - follow up with Janett Billow stroke NP in 6 months  - follow up with pcp regularly  - Physical therapy  Orders Placed This Encounter  Procedures  . Ambulatory referral to Physical Therapy     I had a long d/w patient about her recent stroke, risk for recurrent stroke/TIAs, personally independently reviewed imaging studies and stroke evaluation results and answered questions.Continue ASA 325 for secondary stroke prevention and maintain strict control of hypertension with blood pressure goal below 130/90, diabetes with hemoglobin A1c goal below 6.5% and lipids with LDL cholesterol goal below 70 mg/dL. I also advised the patient to eat a healthy diet with plenty of whole grains, cereals, fruits and vegetables, exercise regularly and maintain ideal body weight .  Cc: [facility]   Sarina Ill, [facility] [address] [place], [place] [address]  Phone [phone] Fax [phone]

## 2018-04-30 NOTE — Addendum Note (Signed)
Addended by: Sarina Ill B on: 04/30/2018 11:07 AM   Modules accepted: Orders

## 2018-05-08 ENCOUNTER — Ambulatory Visit (INDEPENDENT_AMBULATORY_CARE_PROVIDER_SITE_OTHER): Payer: Self-pay | Admitting: Vascular Surgery

## 2018-05-08 ENCOUNTER — Other Ambulatory Visit: Payer: Self-pay

## 2018-05-08 ENCOUNTER — Encounter: Payer: Self-pay | Admitting: Vascular Surgery

## 2018-05-08 VITALS — BP 129/76 | HR 60 | Temp 97.6°F | Resp 20 | Ht 60.0 in | Wt 155.0 lb

## 2018-05-08 DIAGNOSIS — G459 Transient cerebral ischemic attack, unspecified: Secondary | ICD-10-CM

## 2018-05-08 NOTE — Progress Notes (Signed)
[facility]in Specialist of [facility]  Patient name: Brandi Bryant MRN: [id number] DOB: 1956-03-19 Sex: female  REASON FOR CONSULT: Aeration of carotid disease following transient ischemic attack  HPI: Brandi Bryant is a 62 y.o. female, who is here today for follow-up of carotid disease.  She is here today with her daughter and there is a official Administrator, sports with her as well.  She had presented to the emergency room on 03/30/2018 with acute ischemic stroke.  This presented as left facial numbness.  MRI was negative.  CT scan showed intracranial severe stenosis.  Carotid Doppler study showed no evidence of extracranial cerebrovascular occlusive disease.  She has been referred to [place] for further discussion  Past Medical History:  Diagnosis Date  . Abdominal pain   . Arthralgia 08/13/2013  . Bell palsy 2006  . Bell's palsy   . Breast cancer ([place]) 2009   Left Breast Cancer  . Chest pain 09/30/2013  . Cholelithiasis   . Colon cancer screening 10/27/2015  . Complication of anesthesia    " tubal ligation, in [place]"  had weakness, couldnt stand up the next day, was given pills for oxygen for Brain."  1 month after I was having loss of attentiviness, still have to focus  carefully.   . Coronary artery disease   . Depression   . Diabetes mellitus    Type II  . Dyspnea    at times- when air conditioner is runnimg, heat also   . Encounter for screening colonoscopy 10/30/2015  . Hepatic steatosis   . History of breast cancer 06/01/2012  . HTN (hypertension)   . Personal history of chemotherapy 2009   Left Breast Cancer  . Personal history of radiation therapy 2009   Left Breast Cancer    Family History  Problem Relation Age of Onset  . Thyroid cancer Sister   . Hypertension Mother   . Diabetes Mother   . Migraines Daughter     SOCIAL HISTORY: Social History   Socioeconomic History  . Marital status: Single   Spouse name: Not on file  . Number of children: 6  . Years of education: Not on file  . Highest education level: High school graduate  Occupational History  . Not on file  Social Needs  . Financial resource strain: Not on file  . Food insecurity:    Worry: Not on file    Inability: Not on file  . Transportation needs:    Medical: Not on file    Non-medical: Not on file  Tobacco Use  . Smoking status: Never Smoker  . Smokeless tobacco: Never Used  Substance and Sexual Activity  . Alcohol use: No  . Drug use: No  . Sexual activity: Not Currently    Birth control/protection: Surgical  Lifestyle  . Physical activity:    Days per week: 2 days    Minutes per session: 20 min  . Stress: Very much  Relationships  . Social connections:    Talks on phone: More than three times a week    Gets together: More than three times a week    Attends religious service: Never    Active member of club or organization: No    Attends meetings of clubs or organizations: Never    Relationship status: Divorced  . Intimate partner violence:    Fear of current or ex partner: No    Emotionally abused: No    Physically abused: No    Forced sexual activity:  No  Other Topics Concern  . Not on file  Social History Narrative   Lives with her daughter   Right handed    Allergies  Allergen Reactions  . Gadolinium Derivatives Nausea And Vomiting    Code: VOM, Desc: Pt began vomiting 45 sec after MRI contrast injection of Multihance, Onset Date: [id number]      Current Outpatient Medications  Medication Sig Dispense Refill  . albuterol (PROVENTIL HFA;VENTOLIN HFA) 108 (90 Base) MCG/ACT inhaler Inhale 1-2 puffs into the lungs every 6 (six) hours as needed for wheezing or shortness of breath. 54 g 3  . amLODipine (NORVASC) 5 MG tablet Take 1 tablet (5 mg total) by mouth daily. 90 tablet 0  . aspirin 325 MG EC tablet Take 1 tablet (325 mg total) by mouth daily. 30 tablet 6  . atorvastatin (LIPITOR) 40  MG tablet Take 1 tablet (40 mg total) by mouth daily. 30 tablet 6  . carvedilol (COREG) 12.5 MG tablet TAKE 1 TABLET BY MOUTH 2 TIMES DAILY WITH A MEAL. 60 tablet 6  . cetirizine (ZYRTEC) 10 MG tablet Take 1 tablet (10 mg total) by mouth daily. 30 tablet 1  . gabapentin (NEURONTIN) 300 MG capsule Take 2 capsules (600 mg total) by mouth 2 (two) times daily. 60 capsule 6  . glipiZIDE (GLUCOTROL) 10 MG tablet TAKE 1 TABLET BY MOUTH 2 TIMES DAILY BEFORE A MEAL. 60 tablet 6  . Insulin Glargine (LANTUS SOLOSTAR) 100 UNIT/ML Solostar Pen Inject 18 Units into the skin 2 (two) times daily. (Patient taking differently: Inject 20 Units into the skin 2 (two) times daily. ) 15 mL 3  . pantoprazole (PROTONIX) 40 MG tablet Take 1 tablet (40 mg total) by mouth daily. 30 tablet 6  . traZODone (DESYREL) 50 MG tablet Take 1 tablet (50 mg total) by mouth at bedtime as needed for sleep. 30 tablet 6  . TRUEPLUS PEN NEEDLES 31G X 6 MM MISC USE FOR NIGHTLY INSULIN INJECTIONS 100 each 11   Current Facility-Administered Medications  Medication Dose Route Frequency Provider Last Rate Last Dose  . aspirin tablet 325 mg  325 mg Oral Daily Chari Manning A, NP   325 mg at 09/02/15 1628    REVIEW OF SYSTEMS:  [X]  denotes positive finding, [ ]  denotes negative finding Cardiac  Comments:  Chest pain or chest pressure: x   Shortness of breath upon exertion: x   Short of breath when lying flat: x   Irregular heart rhythm: x       Vascular    Pain in calf, thigh, or hip brought on by ambulation: x   Pain in feet at night that wakes you up from your sleep:     Blood clot in your veins: x   Leg swelling:         Pulmonary    Oxygen at home:    Productive cough:     Wheezing:         Neurologic    Sudden weakness in arms or legs:  x   Sudden numbness in arms or legs:  x   Sudden onset of difficulty speaking or slurred speech:    Temporary loss of vision in one eye:     Problems with dizziness:  x         Gastrointestinal    Blood in stool:     Vomited blood:         Genitourinary    Burning when urinating:  Blood in urine:        Psychiatric    Major depression:         Hematologic    Bleeding problems: x   Problems with blood clotting too easily:        Skin    Rashes or ulcers:        Constitutional    Fever or chills: x     PHYSICAL EXAM: Vitals:   05/08/18 1345  BP: 129/76  Pulse: 60  Resp: 20  Temp: 97.6 F (36.4 C)  TempSrc: Oral  SpO2: 98%  Weight: 155 lb (70.3 kg)  Height: 5' (1.524 m)    GENERAL: The patient is a well-nourished female, in no acute distress. The vital signs are documented above. CARDIOVASCULAR: Carotid arteries without bruits bilaterally.  She has 2+ radial and 2+ dorsalis pedis pulses. PULMONARY: There is good air exchange  ABDOMEN: Soft and non-tender  MUSCULOSKELETAL: There are no major deformities or cyanosis. NEUROLOGIC: No focal weakness or paresthesias are detected. SKIN: There are no ulcers or rashes noted. PSYCHIATRIC: The patient has a normal affect.  DATA:  I reviewed her carotid duplex which shows no evidence of carotid stenosis.  CT scan shows intracranial carotid stenosis  MEDICAL ISSUES: Discussed the significance of this with the patient and her daughter.  She does not have any evidence of extra cranial cerebrovascular occlusive disease and no evidence of peripheral arterial disease.  I would not recommend any follow-up regarding her carotid duplex.  She is seeing neurology regarding her intracranial disease and is on appropriate medical management for this   Rosetta Posner, MD [facility] and Vein Specialists of [facility] Tel [phone] Pager [phone]

## 2018-05-09 ENCOUNTER — Ambulatory Visit: Payer: Self-pay | Admitting: Family Medicine

## 2018-06-05 MED FILL — $LANTUS SOLOSTAR 100 UNITS/: 100 | 25 days supply | Qty: 9 | Fill #5

## 2018-06-05 MED FILL — CARVEDILOL 12.5 MG TABLET: 12.5 | 30 days supply | Qty: 60 | Fill #5

## 2018-06-05 MED FILL — glipiZIDE 10 MG TABS: 10 | 30 days supply | Qty: 60 | Fill #5

## 2018-06-05 MED FILL — GABAPENTIN 300 MG CAPSULE: 300 | 15 days supply | Qty: 60 | Fill #5

## 2018-06-05 MED FILL — TRUEPLUS PEN NDL 31G X 1/4: 31G X 6 MM | 25 days supply | Qty: 100 | Fill #2

## 2018-06-05 MED FILL — TRUEPLUS PEN NDL 31G X 1/4": 31G X 6 MM | 25 days supply | Qty: 100 | Fill #2

## 2018-06-14 ENCOUNTER — Ambulatory Visit: Payer: Self-pay | Attending: Family Medicine | Admitting: Physician Assistant

## 2018-06-14 ENCOUNTER — Ambulatory Visit: Payer: Self-pay | Admitting: Physical Therapy

## 2018-06-14 VITALS — BP 130/72 | HR 57 | Temp 98.2°F | Resp 18 | Ht 60.0 in | Wt 160.0 lb

## 2018-06-14 DIAGNOSIS — M79605 Pain in left leg: Secondary | ICD-10-CM | POA: Insufficient documentation

## 2018-06-14 DIAGNOSIS — E119 Type 2 diabetes mellitus without complications: Secondary | ICD-10-CM | POA: Insufficient documentation

## 2018-06-14 DIAGNOSIS — E0800 Diabetes mellitus due to underlying condition with hyperosmolarity without nonketotic hyperglycemic-hyperosmolar coma (NKHHC): Secondary | ICD-10-CM

## 2018-06-14 DIAGNOSIS — Z888 Allergy status to other drugs, medicaments and biological substances status: Secondary | ICD-10-CM | POA: Insufficient documentation

## 2018-06-14 DIAGNOSIS — I10 Essential (primary) hypertension: Secondary | ICD-10-CM | POA: Insufficient documentation

## 2018-06-14 DIAGNOSIS — Z853 Personal history of malignant neoplasm of breast: Secondary | ICD-10-CM | POA: Insufficient documentation

## 2018-06-14 DIAGNOSIS — M79604 Pain in right leg: Secondary | ICD-10-CM | POA: Insufficient documentation

## 2018-06-14 DIAGNOSIS — Z7982 Long term (current) use of aspirin: Secondary | ICD-10-CM | POA: Insufficient documentation

## 2018-06-14 DIAGNOSIS — R609 Edema, unspecified: Secondary | ICD-10-CM | POA: Insufficient documentation

## 2018-06-14 DIAGNOSIS — E870 Hyperosmolality and hypernatremia: Secondary | ICD-10-CM | POA: Insufficient documentation

## 2018-06-14 DIAGNOSIS — M79671 Pain in right foot: Secondary | ICD-10-CM | POA: Insufficient documentation

## 2018-06-14 DIAGNOSIS — Z9221 Personal history of antineoplastic chemotherapy: Secondary | ICD-10-CM | POA: Insufficient documentation

## 2018-06-14 DIAGNOSIS — F329 Major depressive disorder, single episode, unspecified: Secondary | ICD-10-CM | POA: Insufficient documentation

## 2018-06-14 DIAGNOSIS — Z79899 Other long term (current) drug therapy: Secondary | ICD-10-CM | POA: Insufficient documentation

## 2018-06-14 DIAGNOSIS — M79672 Pain in left foot: Secondary | ICD-10-CM | POA: Insufficient documentation

## 2018-06-14 DIAGNOSIS — Z794 Long term (current) use of insulin: Secondary | ICD-10-CM | POA: Insufficient documentation

## 2018-06-14 DIAGNOSIS — Z923 Personal history of irradiation: Secondary | ICD-10-CM | POA: Insufficient documentation

## 2018-06-14 DIAGNOSIS — I2510 Atherosclerotic heart disease of native coronary artery without angina pectoris: Secondary | ICD-10-CM | POA: Insufficient documentation

## 2018-06-14 DIAGNOSIS — Z789 Other specified health status: Secondary | ICD-10-CM

## 2018-06-14 LAB — GLUCOSE, POCT (MANUAL RESULT ENTRY): POC GLUCOSE: 234 mg/dL — AB (ref 70–99)

## 2018-06-14 MED ORDER — CARVEDILOL 12.5 MG PO TABS: ORAL_TABLET | ORAL | 6 refills | Status: DC

## 2018-06-14 MED ORDER — ASPIRIN 325 MG PO TBEC: 325.0000 mg | DELAYED_RELEASE_TABLET | Freq: Every day | ORAL | 6 refills | Status: AC

## 2018-06-14 MED ORDER — FUROSEMIDE 20 MG PO TABS: 20.0000 mg | ORAL_TABLET | Freq: Every day | ORAL | 0 refills | Status: DC

## 2018-06-14 MED ORDER — AMLODIPINE BESYLATE 5 MG PO TABS: 5.0000 mg | ORAL_TABLET | Freq: Every day | ORAL | 0 refills | Status: DC

## 2018-06-14 MED FILL — FUROSEMIDE 20 MG TABLET: 20 | 7 days supply | Qty: 7 | Fill #0

## 2018-06-14 MED FILL — AMLODIPINE BESYLATE 5 MG TA: 5 | 30 days supply | Qty: 30 | Fill #0

## 2018-06-14 NOTE — Patient Instructions (Addendum)
Get compression stockings to wear for work.   Edema (Edema) Un edema es una acumulacin anormal de lquidos. Es ms frecuente en las piernas y los muslos. La hinchazn indolora de pies y tobillos es ms probable a medida que una persona envejece. Tambin es comn en la piel ms floja, como alrededor [employer]. CUIDADOS EN EL HOGAR  Mantenga la parte afectada del cuerpo por encima del nivel del corazn mientras est recostado.  No se quede quieto ni permanezca de pie durante [employer].  No coloque nada exactamente debajo de las rodillas al recostarse.  No use ropa ajustada en los muslos.  Ejercite las piernas para ayudar a que la inflamacin (hinchazn) disminuya.  Use vendajes elsticos o medias de compresin segn las indicaciones del mdico.  Una dieta con bajo contenido de sal puede ayudar a disminuir la hinchazn.  Solo tome los [employer] le haya indicado su mdico.  SOLICITE AYUDA SI:  El tratamiento no funciona.  Tiene una enfermedad cardaca, heptica o renal, y nota que la piel parece hinchada o tiene aspecto brillante.  Tiene hinchazn en las piernas que no mejora cuando las eleva.  Ha aumentado sbitamente de peso sin ningn motivo.  SOLICITE AYUDA DE INMEDIATO SI:  Tiene dificultad para respirar o le duele el pecho.  No puede respirar cuando se acuesta.  Las reas hinchadas presentan dolor, enrojecimiento o Freight forwarder.  Tiene una enfermedad cardaca, heptica o renal, y le aparece un edema de repente.  Tiene fiebre y los sntomas empeoran de manera sbita.  ASEGRESE DE QUE:  Comprende estas instrucciones.  Controlar su afeccin.  Recibir ayuda de inmediato si no mejora o si empeora.  Esta informacin no tiene Marine scientist el consejo del mdico. Asegrese de hacerle al mdico cualquier pregunta que tenga. Document Released: 08/28/2013 Document Revised: 11/12/2013 Document Reviewed: 08/30/2013 Elsevier Interactive Patient Education   2017 [employer].

## 2018-06-14 NOTE — Progress Notes (Signed)
Patient ID: Brandi Bryant, female   DOB: [birth year], 62 y.o.   MRN: [id number]     Brandi Bryant, is a 62 y.o. female  TDD:[id number]  WCB:[id number]  DOB - 1956-08-21  Subjective:  Chief Complaint and HPI: Brandi Bryant is a 62 y.o. female here today swelling and Pain in legs and feet. This has been going on for about 2 weeks.  NKI.  No new activities.  Blood sugars running 110-200 at home.  Worse in the evenings after work.  +sedentary/no exercise.  Denies orthopnea/DOE.  No CP.    Daniela with [employer] translating.  ROS:   Constitutional:  No f/c, No night sweats, No unexplained weight loss. EENT:  No vision changes, No blurry vision, No hearing changes. No mouth, throat, or ear problems.  Respiratory: No cough, No SOB Cardiac: No CP, no palpitations GI:  No abd pain, No N/V/D. GU: No Urinary s/sx Musculoskeletal: +B leg/feet swelling and discomfort Neuro: No headache, no dizziness, no motor weakness.  Skin: No rash Endocrine:  No polydipsia. No polyuria.  Psych: Denies SI/HI  No problems updated.  ALLERGIES: Allergies  Allergen Reactions  . Gadolinium Derivatives Nausea And Vomiting    Code: VOM, Desc: Pt began vomiting 45 sec after MRI contrast injection of Multihance, Onset Date: [id number]      PAST MEDICAL HISTORY: Past Medical History:  Diagnosis Date  . Abdominal pain   . Arthralgia 08/13/2013  . Bell palsy 2006  . Bell's palsy   . Breast cancer ([place]) 2009   Left Breast Cancer  . Chest pain 09/30/2013  . Cholelithiasis   . Colon cancer screening 10/27/2015  . Complication of anesthesia    " tubal ligation, in [place]"  had weakness, couldnt stand up the next day, was given pills for oxygen for Brain."  1 month after I was having loss of attentiviness, still have to focus  carefully.   . Coronary artery disease   . Depression   . Diabetes mellitus    Type II  . Dyspnea    at times- when air conditioner is  runnimg, heat also   . Encounter for screening colonoscopy 10/30/2015  . Hepatic steatosis   . History of breast cancer 06/01/2012  . HTN (hypertension)   . Personal history of chemotherapy 2009   Left Breast Cancer  . Personal history of radiation therapy 2009   Left Breast Cancer    MEDICATIONS AT HOME: Prior to Admission medications   Medication Sig Start Date End Date Taking? Authorizing Provider  albuterol (PROVENTIL HFA;VENTOLIN HFA) 108 (90 Base) MCG/ACT inhaler Inhale 1-2 puffs into the lungs every 6 (six) hours as needed for wheezing or shortness of breath. 04/25/17  Yes Charlott Rakes, MD  amLODipine (NORVASC) 5 MG tablet Take 1 tablet (5 mg total) by mouth daily. 06/14/18  Yes Argentina Donovan, PA-C  aspirin 325 MG EC tablet Take 1 tablet (325 mg total) by mouth daily. 06/14/18  Yes Freeman Caldron M, PA-C  atorvastatin (LIPITOR) 40 MG tablet Take 1 tablet (40 mg total) by mouth daily. 11/27/17  Yes Newlin, Charlane Ferretti, MD  carvedilol (COREG) 12.5 MG tablet TAKE 1 TABLET BY MOUTH 2 TIMES DAILY WITH A MEAL. 06/14/18  Yes Imagine Nest M, PA-C  cetirizine (ZYRTEC) 10 MG tablet Take 1 tablet (10 mg total) by mouth daily. 05/16/17  Yes Charlott Rakes, MD  gabapentin (NEURONTIN) 300 MG capsule Take 2 capsules (600 mg total) by mouth 2 (two) times daily. 11/27/17  Yes Newlin, Enobong,  MD  glipiZIDE (GLUCOTROL) 10 MG tablet TAKE 1 TABLET BY MOUTH 2 TIMES DAILY BEFORE A MEAL. 11/27/17  Yes Charlott Rakes, MD  Insulin Glargine (LANTUS SOLOSTAR) 100 UNIT/ML Solostar Pen Inject 18 Units into the skin 2 (two) times daily. Patient taking differently: Inject 20 Units into the skin 2 (two) times daily.  11/27/17  Yes Charlott Rakes, MD  pantoprazole (PROTONIX) 40 MG tablet Take 1 tablet (40 mg total) by mouth daily. 11/27/17  Yes Charlott Rakes, MD  traZODone (DESYREL) 50 MG tablet Take 1 tablet (50 mg total) by mouth at bedtime as needed for sleep. 05/16/17  Yes Newlin, Charlane Ferretti, MD  TRUEPLUS PEN NEEDLES 31G  X 6 MM MISC USE FOR NIGHTLY INSULIN INJECTIONS 02/22/18  Yes Newlin, Enobong, MD  furosemide (LASIX) 20 MG tablet Take 1 tablet (20 mg total) by mouth daily. 06/14/18   Argentina Donovan, PA-C  escitalopram (LEXAPRO) 10 MG tablet Take 10 mg by mouth daily.  01/31/12  [provider]     Objective:  EXAM:   Vitals:   06/14/18 1521  BP: 130/72  Pulse: (!) 57  Resp: 18  Temp: 98.2 F (36.8 C)  TempSrc: Oral  SpO2: 95%  Weight: 160 lb (72.6 kg)  Height: 5' (1.524 m)    General appearance : A&OX3. NAD. Non-toxic-appearing HEENT: Atraumatic and Normocephalic.  PERRLA. EOM intact.   Neck: supple, no JVD. No cervical lymphadenopathy. No thyromegaly Chest/Lungs:  Breathing-non-labored, Good air entry bilaterally, breath sounds normal without rales, rhonchi, or wheezing  CVS: S1 S2 regular, no murmurs, gallops, rubs  Extremities: Bilateral Lower Ext shows 1+ pretibial and pedal edema, both legs are warm to touch with = pulse throughout Neurology:  CN II-XII grossly intact, Non focal.   Psych:  TP linear. J/I WNL. Normal speech. Appropriate eye contact and affect.  Skin:  No Rash  Data Review Lab Results  Component Value Date   HGBA1C 6.5 (H) 03/31/2018   HGBA1C 7.9 11/27/2017   HGBA1C 8.3 05/16/2017     Assessment & Plan   1. Edema, unspecified type Likely dependent edema.  No signs of failure.   - furosemide (LASIX) 20 MG tablet; Take 1 tablet (20 mg total) by mouth daily.  Dispense: 7 tablet; Refill: 0 - Basic metabolic panel  2. Diabetes mellitus due to underlying condition with hyperosmolarity without coma, without long-term current use of insulin ([place]) Not controlled.  No med changes today as last A1C was 6.5 and Less than 2 months ago and she says most home readings are good.  Work hard on diet/eliminating sugar.   - Glucose (CBG) - aspirin 325 MG EC tablet; Take 1 tablet (325 mg total) by mouth daily.  Dispense: 30 tablet; Refill: 6  3. Essential  hypertension Controlled.  Continue current regimen - amLODipine (NORVASC) 5 MG tablet; Take 1 tablet (5 mg total) by mouth daily.  Dispense: 90 tablet; Refill: 0 - carvedilol (COREG) 12.5 MG tablet; TAKE 1 TABLET BY MOUTH 2 TIMES DAILY WITH A MEAL.  Dispense: 60 tablet; Refill: 6 - aspirin 325 MG EC tablet; Take 1 tablet (325 mg total) by mouth daily.  Dispense: 30 tablet; Refill: 6  4. Language barrier stratus interpreters used and additional time performing visit was required.      Patient have been counseled extensively about nutrition and exercise  Return in about 2 months (around 08/15/2018) for Dr Newlin-f/up DM and htn.  The patient was given clear instructions to go to ER or return to  medical center if symptoms don't improve, worsen or new problems develop. The patient verbalized understanding. The patient was told to call to get lab results if they haven't heard anything in the next week.     Freeman Caldron, PA-C [facility] [place], [place]   06/14/2018, 3:32 PM

## 2018-06-15 ENCOUNTER — Telehealth: Payer: Self-pay | Admitting: *Deleted

## 2018-06-15 LAB — BASIC METABOLIC PANEL
BUN / CREAT RATIO: 13 (ref 12–28)
BUN: 11 mg/dL (ref 8–27)
CO2: 21 mmol/L (ref 20–29)
CREATININE: 0.87 mg/dL (ref 0.57–1.00)
Calcium: 9.2 mg/dL (ref 8.7–10.3)
Chloride: 104 mmol/L (ref 96–106)
GFR calc non Af Amer: 72 mL/min/{1.73_m2} (ref >59)
GFR, EST AFRICAN AMERICAN: 83 mL/min/{1.73_m2} (ref >59)
GLUCOSE: 201 mg/dL — AB (ref 65–99)
Potassium: 3.8 mmol/L (ref 3.5–5.2)
Sodium: 141 mmol/L (ref 134–144)

## 2018-06-15 NOTE — Telephone Encounter (Signed)
-----   Message from Argentina Donovan, [place] sent at 06/15/2018  7:55 AM EDT ----- Labs ormal other than elevated blood sugar.  Work on eliminating sugars from your diet and follow-up as planned.  Thanks, Freeman Caldron, PA-C

## 2018-06-15 NOTE — Telephone Encounter (Signed)
Medical Assistant used [employer] to contact patient.  Interpreter Name: Sunday Spillers Interpreter #: [id number] Patients daughter is aware of DM being elevated and was advised of items to omit from her diet. Patient will follow up as planned.

## 2018-07-02 MED FILL — ATORVASTATIN CALCIUM 40 MG: 40 | 30 days supply | Qty: 30 | Fill #4

## 2018-07-02 MED FILL — glipiZIDE 10 MG TABS: 10 | 30 days supply | Qty: 60 | Fill #6

## 2018-07-02 MED FILL — GABAPENTIN 300 MG CAPSULE: 300 | 15 days supply | Qty: 60 | Fill #6

## 2018-07-02 MED FILL — $LANTUS SOLOSTAR 100 UNITS/: 100 | 30 days supply | Qty: 15 | Fill #2

## 2018-07-02 MED FILL — AMLODIPINE BESYLATE 5 MG TA: 5 | 30 days supply | Qty: 30 | Fill #1

## 2018-07-02 MED FILL — CARVEDILOL 12.5 MG TABLET: 12.5 | 30 days supply | Qty: 60 | Fill #6

## 2018-07-03 ENCOUNTER — Other Ambulatory Visit: Payer: Self-pay

## 2018-07-03 DIAGNOSIS — Z794 Long term (current) use of insulin: Principal | ICD-10-CM

## 2018-07-03 DIAGNOSIS — E1140 Type 2 diabetes mellitus with diabetic neuropathy, unspecified: Secondary | ICD-10-CM

## 2018-07-03 MED ORDER — GLIPIZIDE 10 MG PO TABS: ORAL_TABLET | ORAL | 2 refills | Status: DC

## 2018-07-03 MED ORDER — GABAPENTIN 300 MG PO CAPS: 600.0000 mg | ORAL_CAPSULE | Freq: Two times a day (BID) | ORAL | 6 refills | Status: DC

## 2018-08-07 ENCOUNTER — Other Ambulatory Visit: Payer: Self-pay

## 2018-08-07 DIAGNOSIS — Z794 Long term (current) use of insulin: Principal | ICD-10-CM

## 2018-08-07 DIAGNOSIS — E1140 Type 2 diabetes mellitus with diabetic neuropathy, unspecified: Secondary | ICD-10-CM

## 2018-08-07 MED ORDER — INSULIN GLARGINE 100 UNIT/ML SOLOSTAR PEN: 18.0000 [IU] | PEN_INJECTOR | Freq: Two times a day (BID) | SUBCUTANEOUS | 0 refills | Status: DC

## 2018-08-07 MED FILL — !LANTUS SOLOSTAR 100UNITS/M: 100 | 25 days supply | Qty: 9 | Fill #0

## 2018-08-07 MED FILL — AMLODIPINE BESYLATE 5 MG TA: 5 | 30 days supply | Qty: 30 | Fill #2

## 2018-08-07 MED FILL — CARVEDILOL 12.5 MG TABLET: 12.5 | 30 days supply | Qty: 60 | Fill #0

## 2018-08-07 MED FILL — glipiZIDE 10 MG TABS: 10 | 30 days supply | Qty: 60 | Fill #0

## 2018-08-15 ENCOUNTER — Encounter: Payer: Self-pay | Admitting: Family Medicine

## 2018-08-15 ENCOUNTER — Ambulatory Visit: Payer: Self-pay | Attending: Family Medicine | Admitting: Family Medicine

## 2018-08-15 VITALS — BP 124/70 | HR 66 | Temp 98.1°F | Ht 60.0 in | Wt 160.8 lb

## 2018-08-15 DIAGNOSIS — Z7982 Long term (current) use of aspirin: Secondary | ICD-10-CM | POA: Insufficient documentation

## 2018-08-15 DIAGNOSIS — Z853 Personal history of malignant neoplasm of breast: Secondary | ICD-10-CM | POA: Insufficient documentation

## 2018-08-15 DIAGNOSIS — Z1211 Encounter for screening for malignant neoplasm of colon: Secondary | ICD-10-CM

## 2018-08-15 DIAGNOSIS — K6289 Other specified diseases of anus and rectum: Secondary | ICD-10-CM

## 2018-08-15 DIAGNOSIS — E1140 Type 2 diabetes mellitus with diabetic neuropathy, unspecified: Secondary | ICD-10-CM | POA: Insufficient documentation

## 2018-08-15 DIAGNOSIS — K219 Gastro-esophageal reflux disease without esophagitis: Secondary | ICD-10-CM | POA: Insufficient documentation

## 2018-08-15 DIAGNOSIS — Z794 Long term (current) use of insulin: Secondary | ICD-10-CM | POA: Insufficient documentation

## 2018-08-15 DIAGNOSIS — Z23 Encounter for immunization: Secondary | ICD-10-CM

## 2018-08-15 DIAGNOSIS — E0800 Diabetes mellitus due to underlying condition with hyperosmolarity without nonketotic hyperglycemic-hyperosmolar coma (NKHHC): Secondary | ICD-10-CM

## 2018-08-15 DIAGNOSIS — I10 Essential (primary) hypertension: Secondary | ICD-10-CM | POA: Insufficient documentation

## 2018-08-15 DIAGNOSIS — Z79899 Other long term (current) drug therapy: Secondary | ICD-10-CM | POA: Insufficient documentation

## 2018-08-15 DIAGNOSIS — I2510 Atherosclerotic heart disease of native coronary artery without angina pectoris: Secondary | ICD-10-CM | POA: Insufficient documentation

## 2018-08-15 DIAGNOSIS — Z9049 Acquired absence of other specified parts of digestive tract: Secondary | ICD-10-CM | POA: Insufficient documentation

## 2018-08-15 DIAGNOSIS — E7800 Pure hypercholesterolemia, unspecified: Secondary | ICD-10-CM | POA: Insufficient documentation

## 2018-08-15 DIAGNOSIS — R600 Localized edema: Secondary | ICD-10-CM | POA: Insufficient documentation

## 2018-08-15 LAB — POCT GLYCOSYLATED HEMOGLOBIN (HGB A1C): HBA1C, POC (CONTROLLED DIABETIC RANGE): 6.5 % (ref 0.0–7.0)

## 2018-08-15 LAB — GLUCOSE, POCT (MANUAL RESULT ENTRY): POC GLUCOSE: 180 mg/dL — AB (ref 70–99)

## 2018-08-15 MED ORDER — GABAPENTIN 300 MG PO CAPS: 600.0000 mg | ORAL_CAPSULE | Freq: Two times a day (BID) | ORAL | 6 refills | Status: DC

## 2018-08-15 MED ORDER — INSULIN GLARGINE 100 UNIT/ML SOLOSTAR PEN: 18.0000 [IU] | PEN_INJECTOR | Freq: Two times a day (BID) | SUBCUTANEOUS | 6 refills | Status: DC

## 2018-08-15 MED ORDER — GLIPIZIDE 10 MG PO TABS: ORAL_TABLET | ORAL | 6 refills | Status: DC

## 2018-08-15 MED ORDER — LISINOPRIL 5 MG PO TABS
5.0000 mg | ORAL_TABLET | Freq: Every day | ORAL | 3 refills | Status: DC
Start: 2018-08-15 — End: 2019-01-29

## 2018-08-15 MED ORDER — HYDROCORTISONE ACE-PRAMOXINE 2.5-1 % RE CREA: 1.0000 "application " | TOPICAL_CREAM | Freq: Three times a day (TID) | RECTAL | 1 refills | Status: DC

## 2018-08-15 MED ORDER — ATORVASTATIN CALCIUM 40 MG PO TABS: 40.0000 mg | ORAL_TABLET | Freq: Every day | ORAL | 6 refills | Status: DC

## 2018-08-15 MED FILL — HYDROCORTISONE ACE-PRAMOXIN: 2.5-1 | 10 days supply | Qty: 30 | Fill #0

## 2018-08-15 MED FILL — ATORVASTATIN CALCIUM 40 MG: 40 | 30 days supply | Qty: 30 | Fill #0

## 2018-08-15 MED FILL — LISINOPRIL 5 MG TAB: 5 | 30 days supply | Qty: 30 | Fill #0

## 2018-08-15 MED FILL — GABAPENTIN 300 MG CAPSULE: 300 | 15 days supply | Qty: 60 | Fill #0

## 2018-08-15 NOTE — Progress Notes (Signed)
Subjective:  Patient ID: Brandi Bryant, female    DOB: [birth year]  Age: 62 y.o. MRN: [id number]  CC: Diabetes and Hypertension   HPI Brandi Bryant is a 62 year old female with a history of GERD, hypertension, hyperlipidemia, type 2 diabetes mellitus (A1c 6.5) previous CVA, TIA in 03/2018 here for a follow up visit. She complains of pedal edema which is present all the time but denies dyspnea or chest pain. She also complains about abdominal pain worse after meals and drinks and associated reflux which is uncontrolled on Protonix. She denies nausea, excessive burping. She has also had 'inflammation around her rectum' but denies constipation or diarrhea  And states she has had this in the past. She is doing well on her antihypertensives and statin with no complains of adverse effects. Her Diabetes is controlled with no complains of hypoglycemia or numbness and she has no visual complaints. She was seen by vascular due to  Stenosis of internal carotid artery seen on MRI brain in 03/2018, carotid doppler was normal - no evidence of extra cranial cerebrovascular disease as per vascular, no additional recommendation.  Past Medical History:  Diagnosis Date  . Abdominal pain   . Arthralgia 08/13/2013  . Bell palsy 2006  . Bell's palsy   . Breast cancer ([place]) 2009   Left Breast Cancer  . Chest pain 09/30/2013  . Cholelithiasis   . Colon cancer screening 10/27/2015  . Complication of anesthesia    " tubal ligation, in [place]"  had weakness, couldnt stand up the next day, was given pills for oxygen for Brain."  1 month after I was having loss of attentiviness, still have to focus  carefully.   . Coronary artery disease   . Depression   . Diabetes mellitus    Type II  . Dyspnea    at times- when air conditioner is runnimg, heat also   . Encounter for screening colonoscopy 10/30/2015  . Hepatic steatosis   . History of breast cancer 06/01/2012  . HTN (hypertension)   .  Personal history of chemotherapy 2009   Left Breast Cancer  . Personal history of radiation therapy 2009   Left Breast Cancer    Past Surgical History:  Procedure Laterality Date  . ANKLE ARTHROSCOPY Right 12/14/2016   Procedure: Right Ankle Arthroscopic Debridement;  Surgeon: Newt Minion, MD;  Location: [facility];  Service: Orthopedics;  Laterality: Right;  . Arm surgery     Left tendon lengthen  . BREAST LUMPECTOMY Left 2009  . CHOLECYSTECTOMY N/A 12/09/2015   Procedure: LAPAROSCOPIC CHOLECYSTECTOMY WITH INTRAOPERATIVE CHOLANGIOGRAM;  Surgeon: Greer Pickerel, MD;  Location: [facility];  Service: General;  Laterality: N/A;  . LEFT HEART CATHETERIZATION WITH CORONARY ANGIOGRAM N/A 10/30/2013   Procedure: LEFT HEART CATHETERIZATION WITH CORONARY ANGIOGRAM;  Surgeon: Josue Hector, MD;  Location: [facility] CATH LAB;  Service: Cardiovascular;  Laterality: N/A;  . porta cath Right   . Removal of Porta cath Right   . TUBAL LIGATION      Allergies  Allergen Reactions  . Gadolinium Derivatives Nausea And Vomiting    Code: VOM, Desc: Pt began vomiting 45 sec after MRI contrast injection of Multihance, Onset Date: [id number]       Outpatient Medications Prior to Visit  Medication Sig Dispense Refill  . albuterol (PROVENTIL HFA;VENTOLIN HFA) 108 (90 Base) MCG/ACT inhaler Inhale 1-2 puffs into the lungs every 6 (six) hours as needed for wheezing or shortness of breath. 54 g 3  . aspirin 325 MG  EC tablet Take 1 tablet (325 mg total) by mouth daily. 30 tablet 6  . carvedilol (COREG) 12.5 MG tablet TAKE 1 TABLET BY MOUTH 2 TIMES DAILY WITH A MEAL. 60 tablet 6  . pantoprazole (PROTONIX) 40 MG tablet Take 1 tablet (40 mg total) by mouth daily. 30 tablet 6  . traZODone (DESYREL) 50 MG tablet Take 1 tablet (50 mg total) by mouth at bedtime as needed for sleep. 30 tablet 6  . TRUEPLUS PEN NEEDLES 31G X 6 MM MISC USE FOR NIGHTLY INSULIN INJECTIONS 100 each 11  . amLODipine (NORVASC) 5 MG tablet Take 1 tablet (5 mg  total) by mouth daily. 90 tablet 0  . atorvastatin (LIPITOR) 40 MG tablet Take 1 tablet (40 mg total) by mouth daily. 30 tablet 6  . gabapentin (NEURONTIN) 300 MG capsule Take 2 capsules (600 mg total) by mouth 2 (two) times daily. 60 capsule 6  . glipiZIDE (GLUCOTROL) 10 MG tablet TAKE 1 TABLET BY MOUTH 2 TIMES DAILY BEFORE A MEAL. 60 tablet 2  . Insulin Glargine (LANTUS SOLOSTAR) 100 UNIT/ML Solostar Pen Inject 18 Units into the skin 2 (two) times daily. 15 mL 0  . cetirizine (ZYRTEC) 10 MG tablet Take 1 tablet (10 mg total) by mouth daily. (Patient not taking: Reported on 08/15/2018) 30 tablet 1  . furosemide (LASIX) 20 MG tablet Take 1 tablet (20 mg total) by mouth daily. (Patient not taking: Reported on 08/15/2018) 7 tablet 0   No facility-administered medications prior to visit.     ROS Review of Systems  Constitutional: Negative for activity change, appetite change and fatigue.  HENT: Negative for congestion, sinus pressure and sore throat.   Eyes: Negative for visual disturbance.  Respiratory: Negative for cough, chest tightness, shortness of breath and wheezing.   Cardiovascular: Negative for chest pain and palpitations.  Gastrointestinal:       See hpi  Endocrine: Negative for polydipsia.  Genitourinary: Negative for dysuria and frequency.  Musculoskeletal: Negative for arthralgias and back pain.  Skin: Negative for rash.  Neurological: Negative for tremors, light-headedness and numbness.  Hematological: Does not bruise/bleed easily.  Psychiatric/Behavioral: Negative for agitation and behavioral problems.    Objective:  BP 124/70   Pulse 66   Temp 98.1 F (36.7 C) (Oral)   Ht 5' (1.524 m)   Wt 160 lb 12.8 oz (72.9 kg)   SpO2 97%   BMI 31.40 kg/m   BP/Weight 08/15/2018 06/14/2018 8/33/8250  Systolic BP 539 767 341  Diastolic BP 70 72 76  Wt. (Lbs) 160.8 160 155  BMI 31.4 31.25 30.27     Physical Exam  Constitutional: She is oriented to person, place, and time.  She appears well-developed and well-nourished.  HENT:  Right Ear: External ear normal.  Left Ear: External ear normal.  Mouth/Throat: Oropharynx is clear and moist.  Cardiovascular: Normal rate, normal heart sounds and intact distal pulses.  No murmur heard. Pulmonary/Chest: Effort normal and breath sounds normal. She has no wheezes. She has no rales. She exhibits no tenderness.  Abdominal: Soft. Bowel sounds are normal. She exhibits no distension and no mass. There is no tenderness.  Musculoskeletal: Normal range of motion.  Neurological: She is alert and oriented to person, place, and time.  Skin: Skin is warm and dry.  Psychiatric: She has a normal mood and affect.     CMP Latest Ref Rng & Units 06/14/2018 03/30/2018 03/30/2018  Glucose 65 - 99 mg/dL 201(H) 188(H) 190(H)  BUN 8 - 27  mg/dL 11 17 16   Creatinine 0.57 - 1.00 mg/dL 0.87 0.90 0.93  Sodium 134 - 144 mmol/L 141 143 143  Potassium 3.5 - 5.2 mmol/L 3.8 3.9 3.9  Chloride 96 - 106 mmol/L 104 107 110  CO2 20 - 29 mmol/L 21 - 26  Calcium 8.7 - 10.3 mg/dL 9.2 - 9.4  Total Protein 6.5 - 8.1 g/dL - - 7.1  Total Bilirubin 0.3 - 1.2 mg/dL - - 0.8  Alkaline Phos 38 - 126 U/L - - 116  AST 15 - 41 U/L - - 34  ALT 14 - 54 U/L - - 25    Lipid Panel     Component Value Date/Time   CHOL 107 03/31/2018 0348   CHOL 130 11/28/2017 0937   TRIG 167 (H) 03/31/2018 0348   HDL 41 03/31/2018 0348   HDL 40 11/28/2017 0937   CHOLHDL 2.6 03/31/2018 0348   VLDL 33 03/31/2018 0348   LDLCALC 33 03/31/2018 0348   LDLCALC 56 11/28/2017 0937    Lab Results  Component Value Date   HGBA1C 6.5 08/15/2018    Assessment & Plan:   1. Diabetes mellitus due to underlying condition with hyperosmolarity without coma, without long-term current use of insulin (HCC) Controlled Continue current regimen Counseled on Diabetic diet, my plate method, 160 minutes of moderate intensity exercise/week Keep blood sugar logs with fasting goals of 80-120 mg/dl,  random of less than 180 and in the event of sugars less than 60 mg/dl or greater than 400 mg/dl please notify the clinic ASAP. It is recommended that you undergo annual eye exams and annual foot exams. Pneumonia vaccine is recommended. - POCT glucose (manual entry) - POCT glycosylated hemoglobin (Hb A1C) - Microalbumin/Creatinine Ratio, Urine  2. Type 2 diabetes mellitus with diabetic neuropathy, with long-term current use of insulin (HCC) Stable - Insulin Glargine (LANTUS SOLOSTAR) 100 UNIT/ML Solostar Pen; Inject 18 Units into the skin 2 (two) times daily.  Dispense: 15 mL; Refill: 6 - glipiZIDE (GLUCOTROL) 10 MG tablet; TAKE 1 TABLET BY MOUTH 2 TIMES DAILY BEFORE A MEAL.  Dispense: 60 tablet; Refill: 6 - gabapentin (NEURONTIN) 300 MG capsule; Take 2 capsules (600 mg total) by mouth 2 (two) times daily.  Dispense: 60 capsule; Refill: 6  3. Pure hypercholesterolemia Controlled Low cholesterol diet - atorvastatin (LIPITOR) 40 MG tablet; Take 1 tablet (40 mg total) by mouth daily.  Dispense: 30 tablet; Refill: 6  4. Pedal edema Discontinue Amlodipine Low sodium diet, elevated feet.  5. Gastroesophageal reflux disease without esophagitis Uncontrolled Would love to test for H.pylori however she is on a PPI and is reluctant to come off it for the test. - Ambulatory referral to Gastroenterology  6. Proctalgia - hydrocortisone-pramoxine (ANALPRAM HC) 2.5-1 % rectal cream; Place 1 application rectally 3 (three) times daily.  Dispense: 30 g; Refill: 1  7. Screening for colon cancer - Ambulatory referral to Gastroenterology  8. Essential hypertension Controlled Switched from Amlodipine to Lisinopril due to complaints of pedal edema - lisinopril (PRINIVIL,ZESTRIL) 5 MG tablet; Take 1 tablet (5 mg total) by mouth daily.  Dispense: 90 tablet; Refill: 3   Meds ordered this encounter  Medications  . lisinopril (PRINIVIL,ZESTRIL) 5 MG tablet    Sig: Take 1 tablet (5 mg total) by mouth  daily.    Dispense:  90 tablet    Refill:  3    Discontinue Amlodipine  . Insulin Glargine (LANTUS SOLOSTAR) 100 UNIT/ML Solostar Pen    Sig: Inject 18 Units into  the skin 2 (two) times daily.    Dispense:  15 mL    Refill:  6  . glipiZIDE (GLUCOTROL) 10 MG tablet    Sig: TAKE 1 TABLET BY MOUTH 2 TIMES DAILY BEFORE A MEAL.    Dispense:  60 tablet    Refill:  6  . gabapentin (NEURONTIN) 300 MG capsule    Sig: Take 2 capsules (600 mg total) by mouth 2 (two) times daily.    Dispense:  60 capsule    Refill:  6    Discontinue previous dose  . atorvastatin (LIPITOR) 40 MG tablet    Sig: Take 1 tablet (40 mg total) by mouth daily.    Dispense:  30 tablet    Refill:  6  . hydrocortisone-pramoxine (ANALPRAM HC) 2.5-1 % rectal cream    Sig: Place 1 application rectally 3 (three) times daily.    Dispense:  30 g    Refill:  1    Follow-up: Return in about 3 months (around 11/14/2018) for Follow-up of chronic medical conditions.   Charlott Rakes MD

## 2018-08-15 NOTE — Patient Instructions (Signed)
Acidez estomacal (Heartburn) La acidez estomacal es un tipo de dolor o de molestia que se puede presentar en la garganta o en el pecho. A menudo se la describe como Designer, multimedia. Tambin puede producir mal aliento. La sensacin de [employer]  al acostarse o inclinarse. Puede deberse al retroceso (reflujo) de los contenidos estomacales hacia el tubo que conecta la boca con el estmago (esfago). [place] estas medidas para aliviar las molestias y [employer]. Dieta  Siga la dieta como se lo haya indicado el mdico. Tal vez deba evitar los siguientes alimentos y bebidas: ? Caf y t (con o sin cafena). ? Bebidas que contengan alcohol. ? Bebidas energizantes y deportivas. ? Gaseosas o refrescos. ? Chocolate y cacao. ? Menta y [place]. ? Ajo y cebollas. ? Rbano picante. ? Alimentos muy condimentados y cidos, como pimientos, [place] en polvo, curry en polvo, vinagre, salsas picantes y Engineering geologist. ? Frutas ctricas y sus jugos, como naranjas, limones y limas. ? Alimentos a base de tomates, como salsa roja, [place], salsa y pizza con salsa roja. ? Alimentos fritos y Radio broadcast assistant, como rosquillas, papas fritas y aderezos con alto contenido de Lobbyist. ? Carnes con alto contenido de [place], como hot dogs, filetes de entrecot, salchicha, jamn y tocino. ? Productos lcteos con alto contenido de grasa, como [place], [place] y [place] crema.  Consuma pequeas porciones de comida con ms frecuencia. Evite consumir porciones abundantes.  Evite beber [place] comidas.  No coma durante las 2 o 3horas previas a la hora de [place].  No se acueste inmediatamente despus de comer.  No haga actividad fsica enseguida despus de comer. Instrucciones generales  Est atento a cualquier cambio en los sntomas.  Tome los medicamentos de venta libre y los recetados solamente como se lo haya indicado el mdico. No tome aspirina, ibuprofeno ni  otros antiinflamatorios no esteroides (AINE), a menos que el mdico lo autorice.  No consuma ningn producto que contenga tabaco, lo que incluye cigarrillos, tabaco de Higher education careers adviser y Psychologist, sport and exercise. Si necesita ayuda para dejar de fumar, consulte al mdico.  Use ropa suelta. No use nada ajustado alrededor [employer].  [place] (eleve) unas 6pulgadas (15centmetros) la cabecera de la cama.  Intente bajar el nivel de estrs. Si necesita ayuda para hacerlo, consulte al [employer].  Si tiene sobrepeso, Multimedia programmer un peso saludable. Pregntele a su mdico cmo puede perder peso de manera segura.  Concurra a todas las visitas de control como se lo haya indicado el mdico. Esto es importante. SOLICITE AYUDA SI:  Aparecen nuevos sntomas.  [place] y no sabe por qu.  Tiene dificultad para tragar o siente dolor al [employer].  Tiene sibilancias o tos que no desaparece.  Los sntomas no mejoran con Dispensing optician.  Tiene acidez frecuentemente durante ms de [employer]V. SOLICITE AYUDA DE INMEDIATO SI:  Tiene dolor en los brazos, el cuello, los [place], la dentadura o la espalda.  Philbert Riser, se marea o tiene sensacin de desvanecimiento.  Siente falta de aire o Tourist information centre manager.  Vomita y el vmito es parecido a la sangre o a los granos de caf.  Las heces son sanguinolentas o de color negro. Esta informacin no tiene Marine scientist el consejo del mdico. Asegrese de hacerle al mdico cualquier pregunta que tenga. Document Released: 07/20/2011 Document Revised: 07/29/2015 Document Reviewed: 03/04/2015 Elsevier Interactive Patient Education  [employer].

## 2018-08-22 ENCOUNTER — Other Ambulatory Visit: Payer: Self-pay

## 2018-08-22 MED ORDER — TETANUS-DIPHTH-ACELL PERTUSSIS 5-2.5-18.5 LF-MCG/0.5 IM SUSP: 0.5000 mL | INTRAMUSCULAR | 0 refills | Status: DC

## 2018-09-03 MED FILL — glipiZIDE 10 MG TABS: 10 | 30 days supply | Qty: 60 | Fill #1

## 2018-09-03 MED FILL — GABAPENTIN 300 MG CAPSULE: 300 | 15 days supply | Qty: 60 | Fill #1

## 2018-09-03 MED FILL — !LANTUS SOLOSTAR 100UNITS/M: 100 | 16 days supply | Qty: 6 | Fill #1

## 2018-09-03 MED FILL — CARVEDILOL 12.5 MG TABLET: 12.5 | 30 days supply | Qty: 60 | Fill #1

## 2018-09-03 MED FILL — AMLODIPINE BESYLATE 5 MG TA: 5 | 30 days supply | Qty: 30 | Fill #5

## 2018-09-20 MED FILL — LISINOPRIL 5 MG TABLET: 5 | 30 days supply | Qty: 30 | Fill #1

## 2018-09-20 MED FILL — !LANTUS SOLOSTAR 100UNITS/M: 100 | 25 days supply | Qty: 9 | Fill #0

## 2018-10-08 MED FILL — glipiZIDE 10 MG TABS: 10 | 30 days supply | Qty: 60 | Fill #2

## 2018-10-08 MED FILL — LANTUS SOLOSTAR 100 UNITS/M: 100 | 25 days supply | Qty: 9 | Fill #1

## 2018-10-08 MED FILL — CARVEDILOL 12.5 MG TABLET: 12.5 | 30 days supply | Qty: 60 | Fill #2

## 2018-10-22 MED FILL — LISINOPRIL 5 MG TAB: 5 | 30 days supply | Qty: 30 | Fill #2

## 2018-10-30 ENCOUNTER — Encounter: Payer: Self-pay | Admitting: Family Medicine

## 2018-10-30 ENCOUNTER — Ambulatory Visit: Payer: Self-pay | Attending: Family Medicine | Admitting: Family Medicine

## 2018-10-30 VITALS — BP 145/73 | HR 60 | Temp 97.9°F | Ht 60.0 in | Wt 160.4 lb

## 2018-10-30 DIAGNOSIS — Z79899 Other long term (current) drug therapy: Secondary | ICD-10-CM | POA: Insufficient documentation

## 2018-10-30 DIAGNOSIS — Z794 Long term (current) use of insulin: Secondary | ICD-10-CM | POA: Insufficient documentation

## 2018-10-30 DIAGNOSIS — Z7982 Long term (current) use of aspirin: Secondary | ICD-10-CM | POA: Insufficient documentation

## 2018-10-30 DIAGNOSIS — I2510 Atherosclerotic heart disease of native coronary artery without angina pectoris: Secondary | ICD-10-CM | POA: Insufficient documentation

## 2018-10-30 DIAGNOSIS — Z853 Personal history of malignant neoplasm of breast: Secondary | ICD-10-CM | POA: Insufficient documentation

## 2018-10-30 DIAGNOSIS — I10 Essential (primary) hypertension: Secondary | ICD-10-CM | POA: Insufficient documentation

## 2018-10-30 DIAGNOSIS — E785 Hyperlipidemia, unspecified: Secondary | ICD-10-CM | POA: Insufficient documentation

## 2018-10-30 DIAGNOSIS — Z888 Allergy status to other drugs, medicaments and biological substances status: Secondary | ICD-10-CM | POA: Insufficient documentation

## 2018-10-30 DIAGNOSIS — E1140 Type 2 diabetes mellitus with diabetic neuropathy, unspecified: Secondary | ICD-10-CM | POA: Insufficient documentation

## 2018-10-30 DIAGNOSIS — B078 Other viral warts: Secondary | ICD-10-CM | POA: Insufficient documentation

## 2018-10-30 DIAGNOSIS — R1013 Epigastric pain: Secondary | ICD-10-CM | POA: Insufficient documentation

## 2018-10-30 DIAGNOSIS — Z8673 Personal history of transient ischemic attack (TIA), and cerebral infarction without residual deficits: Secondary | ICD-10-CM | POA: Insufficient documentation

## 2018-10-30 DIAGNOSIS — R112 Nausea with vomiting, unspecified: Secondary | ICD-10-CM | POA: Insufficient documentation

## 2018-10-30 DIAGNOSIS — K219 Gastro-esophageal reflux disease without esophagitis: Secondary | ICD-10-CM | POA: Insufficient documentation

## 2018-10-30 LAB — POCT GLYCOSYLATED HEMOGLOBIN (HGB A1C): HBA1C, POC (CONTROLLED DIABETIC RANGE): 6.3 % (ref 0.0–7.0)

## 2018-10-30 LAB — GLUCOSE, POCT (MANUAL RESULT ENTRY): POC GLUCOSE: 280 mg/dL — AB (ref 70–99)

## 2018-10-30 MED ORDER — PROMETHAZINE HCL 25 MG PO TABS: 25.0000 mg | ORAL_TABLET | Freq: Three times a day (TID) | ORAL | 0 refills | Status: DC | PRN

## 2018-10-30 MED ORDER — IMIQUIMOD 5 % EX CREA: TOPICAL_CREAM | CUTANEOUS | 1 refills | Status: DC

## 2018-10-30 MED FILL — IMIQUIMOD 5 % CREA: 5 | 25 days supply | Qty: 12 | Fill #0

## 2018-10-30 MED FILL — PROMETHAZINE 25 MG TABLET: 25 | 6 days supply | Qty: 20 | Fill #0

## 2018-10-30 NOTE — Progress Notes (Signed)
Patient is having abdominal pain with nausea.

## 2018-10-30 NOTE — Progress Notes (Signed)
Subjective:  Patient ID: Brandi Bryant, female    DOB: 02-Feb-1956  Age: 62 y.o. MRN: [id number]  CC: Abdominal Pain and Diabetes   HPI Brandi Bryant  is a 62 year old female with a history of GERD, hypertension, hyperlipidemia, type 2 diabetes mellitus (A1c 6.5) previous CVA, TIA in 03/2018 here for a follow up visit. She complains of epigastric pain with associated nausea which she has had for 3 months intermittently but over the last 2 weeks have been daily.  She has pantoprazole on her med list but has been taking this intermittently as it also causes nausea; her last use of pantoprazole was 2 days ago.  Denies diarrhea, constipation, vomiting, fever. She also complains of a lesion in her labia which she previously scratched of but has reoccurred and she denies it being pruritic.  She denies vaginal discharge or urinary symptoms.  Past Medical History:  Diagnosis Date  . Abdominal pain   . Arthralgia 08/13/2013  . Bell palsy 2006  . Bell's palsy   . Breast cancer ([place]) 2009   Left Breast Cancer  . Chest pain 09/30/2013  . Cholelithiasis   . Colon cancer screening 10/27/2015  . Complication of anesthesia    " tubal ligation, in [place]"  had weakness, couldnt stand up the next day, was given pills for oxygen for Brain."  1 month after I was having loss of attentiviness, still have to focus  carefully.   . Coronary artery disease   . Depression   . Diabetes mellitus    Type II  . Dyspnea    at times- when air conditioner is runnimg, heat also   . Encounter for screening colonoscopy 10/30/2015  . Hepatic steatosis   . History of breast cancer 06/01/2012  . HTN (hypertension)   . Personal history of chemotherapy 2009   Left Breast Cancer  . Personal history of radiation therapy 2009   Left Breast Cancer    Past Surgical History:  Procedure Laterality Date  . ANKLE ARTHROSCOPY Right 12/14/2016   Procedure: Right Ankle Arthroscopic Debridement;  Surgeon:  Newt Minion, MD;  Location: [facility];  Service: Orthopedics;  Laterality: Right;  . Arm surgery     Left tendon lengthen  . BREAST LUMPECTOMY Left 2009  . CHOLECYSTECTOMY N/A 12/09/2015   Procedure: LAPAROSCOPIC CHOLECYSTECTOMY WITH INTRAOPERATIVE CHOLANGIOGRAM;  Surgeon: Greer Pickerel, MD;  Location: [facility];  Service: General;  Laterality: N/A;  . LEFT HEART CATHETERIZATION WITH CORONARY ANGIOGRAM N/A 10/30/2013   Procedure: LEFT HEART CATHETERIZATION WITH CORONARY ANGIOGRAM;  Surgeon: Josue Hector, MD;  Location: [facility] CATH LAB;  Service: Cardiovascular;  Laterality: N/A;  . porta cath Right   . Removal of Porta cath Right   . TUBAL LIGATION      Allergies  Allergen Reactions  . Gadolinium Derivatives Nausea And Vomiting    Code: VOM, Desc: Pt began vomiting 45 sec after MRI contrast injection of Multihance, Onset Date: [id number]       Outpatient Medications Prior to Visit  Medication Sig Dispense Refill  . albuterol (PROVENTIL HFA;VENTOLIN HFA) 108 (90 Base) MCG/ACT inhaler Inhale 1-2 puffs into the lungs every 6 (six) hours as needed for wheezing or shortness of breath. 54 g 3  . aspirin 325 MG EC tablet Take 1 tablet (325 mg total) by mouth daily. 30 tablet 6  . atorvastatin (LIPITOR) 40 MG tablet Take 1 tablet (40 mg total) by mouth daily. 30 tablet 6  . carvedilol (COREG) 12.5 MG tablet TAKE  1 TABLET BY MOUTH 2 TIMES DAILY WITH A MEAL. 60 tablet 6  . gabapentin (NEURONTIN) 300 MG capsule Take 2 capsules (600 mg total) by mouth 2 (two) times daily. 60 capsule 6  . glipiZIDE (GLUCOTROL) 10 MG tablet TAKE 1 TABLET BY MOUTH 2 TIMES DAILY BEFORE A MEAL. 60 tablet 6  . hydrocortisone-pramoxine (ANALPRAM HC) 2.5-1 % rectal cream Place 1 application rectally 3 (three) times daily. 30 g 1  . Insulin Glargine (LANTUS SOLOSTAR) 100 UNIT/ML Solostar Pen Inject 18 Units into the skin 2 (two) times daily. 15 mL 6  . lisinopril (PRINIVIL,ZESTRIL) 5 MG tablet Take 1 tablet (5 mg total) by mouth  daily. 90 tablet 3  . traZODone (DESYREL) 50 MG tablet Take 1 tablet (50 mg total) by mouth at bedtime as needed for sleep. 30 tablet 6  . TRUEPLUS PEN NEEDLES 31G X 6 MM MISC USE FOR NIGHTLY INSULIN INJECTIONS 100 each 11  . cetirizine (ZYRTEC) 10 MG tablet Take 1 tablet (10 mg total) by mouth daily. (Patient not taking: Reported on 08/15/2018) 30 tablet 1  . pantoprazole (PROTONIX) 40 MG tablet Take 1 tablet (40 mg total) by mouth daily. (Patient not taking: Reported on 10/30/2018) 30 tablet 6  . Tdap (BOOSTRIX) 5-2.5-18.5 LF-MCG/0.5 injection Inject 0.5 mLs into the muscle as directed. 0.5 mL 0   No facility-administered medications prior to visit.     ROS Review of Systems  Constitutional: Negative for activity change, appetite change and fatigue.  HENT: Negative for congestion, sinus pressure and sore throat.   Eyes: Negative for visual disturbance.  Respiratory: Negative for cough, chest tightness, shortness of breath and wheezing.   Cardiovascular: Negative for chest pain and palpitations.  Gastrointestinal: Positive for abdominal pain. Negative for abdominal distention and constipation.  Endocrine: Negative for polydipsia.  Genitourinary: Negative for dysuria and frequency.  Musculoskeletal: Negative for arthralgias and back pain.  Skin: Positive for rash.  Neurological: Negative for tremors, light-headedness and numbness.  Hematological: Does not bruise/bleed easily.  Psychiatric/Behavioral: Negative for agitation and behavioral problems.    Objective:  BP (!) 145/73   Pulse 60   Temp 97.9 F (36.6 C) (Oral)   Ht 5' (1.524 m)   Wt 160 lb 6.4 oz (72.8 kg)   SpO2 98%   BMI 31.33 kg/m   BP/Weight 10/30/2018 08/15/2018 9/56/3875  Systolic BP 643 329 518  Diastolic BP 73 70 72  Wt. (Lbs) 160.4 160.8 160  BMI 31.33 31.4 31.25      Physical Exam  Constitutional: She is oriented to person, place, and time. She appears well-developed and well-nourished.  Cardiovascular:  Normal rate, normal heart sounds and intact distal pulses.  No murmur heard. Pulmonary/Chest: Effort normal and breath sounds normal. She has no wheezes. She has no rales. She exhibits no tenderness.  Abdominal: Soft. Bowel sounds are normal. She exhibits no distension and no mass. There is no tenderness.  Genitourinary:  Genitourinary Comments: Anogenital wart on left labia majora measuring 0.5 x 0.5 cm, hyperpigmented.  Musculoskeletal: Normal range of motion.  Neurological: She is alert and oriented to person, place, and time.  Psychiatric: She has a normal mood and affect.     Lab Results  Component Value Date   HGBA1C 6.3 10/30/2018    Assessment & Plan:   1. Type 2 diabetes mellitus with diabetic neuropathy, with long-term current use of insulin (HCC) Controlled with A1c of 6.3 Continue current regimen - POCT glucose (manual entry) - POCT glycosylated hemoglobin (Hb  A1C)  2. Epigastric pain She took a PPI 2 days ago Will need to be resolved PPI for 2 weeks prior to H. pylori breath test - H. pylori breath test; Future  3. Non-intractable vomiting with nausea, unspecified vomiting type - promethazine (PHENERGAN) 25 MG tablet; Take 1 tablet (25 mg total) by mouth every 8 (eight) hours as needed for nausea or vomiting.  Dispense: 20 tablet; Refill: 0  4. Other viral warts - imiquimod (ALDARA) 5 % cream; Apply topically 3 (three) times a week.  Dispense: 12 each; Refill: 1   Meds ordered this encounter  Medications  . promethazine (PHENERGAN) 25 MG tablet    Sig: Take 1 tablet (25 mg total) by mouth every 8 (eight) hours as needed for nausea or vomiting.    Dispense:  20 tablet    Refill:  0  . imiquimod (ALDARA) 5 % cream    Sig: Apply topically 3 (three) times a week.    Dispense:  12 each    Refill:  1    Follow-up: Return in about 2 weeks (around 11/13/2018) for Nurse visit for H. pylori test, 3 months with PCP.   Charlott Rakes MD

## 2018-10-31 ENCOUNTER — Ambulatory Visit: Payer: Self-pay | Admitting: Adult Health

## 2018-11-05 MED FILL — ATORVASTATIN CALCIUM 40 MG: 40 | 30 days supply | Qty: 30 | Fill #1

## 2018-11-05 MED FILL — glipiZIDE 10 MG TABS: 10 | 30 days supply | Qty: 60 | Fill #0

## 2018-11-05 MED FILL — !LANTUS SOLOSTAR 100UNITS/M: 100 | 25 days supply | Qty: 9 | Fill #2

## 2018-11-05 MED FILL — CARVEDILOL 12.5 MG TABLET: 12.5 | 30 days supply | Qty: 60 | Fill #3

## 2018-11-06 ENCOUNTER — Ambulatory Visit: Payer: Self-pay | Attending: Family Medicine

## 2018-11-06 DIAGNOSIS — R1013 Epigastric pain: Secondary | ICD-10-CM

## 2018-11-06 NOTE — Progress Notes (Signed)
Patient here for lab visit only 

## 2018-11-07 DIAGNOSIS — G5600 Carpal tunnel syndrome, unspecified upper limb: Secondary | ICD-10-CM | POA: Insufficient documentation

## 2018-11-08 LAB — H. PYLORI BREATH TEST: H PYLORI BREATH TEST: NEGATIVE

## 2018-11-19 ENCOUNTER — Ambulatory Visit: Payer: Self-pay | Admitting: Family Medicine

## 2018-11-28 MED FILL — LISINOPRIL 5 MG TAB: 5 | 30 days supply | Qty: 30 | Fill #3

## 2018-11-28 MED FILL — glipiZIDE 10 MG TABS: 10 | 30 days supply | Qty: 60 | Fill #1

## 2018-11-28 MED FILL — CARVEDILOL 12.5 MG TABLET: 12.5 | 30 days supply | Qty: 60 | Fill #4

## 2018-11-28 MED FILL — ATORVASTATIN CALCIUM 40 MG: 40 | 30 days supply | Qty: 30 | Fill #2

## 2018-12-28 MED FILL — glipiZIDE 10 MG TABS: 10 | 30 days supply | Qty: 60 | Fill #2

## 2018-12-28 MED FILL — LISINOPRIL 5 MG TAB: 5 | 30 days supply | Qty: 30 | Fill #4

## 2018-12-28 MED FILL — !LANTUS SOLOSTAR 100UNITS/M: 100 | 25 days supply | Qty: 9 | Fill #3

## 2018-12-28 MED FILL — ATORVASTATIN CALCIUM 40 MG: 40 | 30 days supply | Qty: 30 | Fill #3

## 2018-12-28 MED FILL — CARVEDILOL 12.5 MG TABLET: 12.5 | 30 days supply | Qty: 60 | Fill #5

## 2019-01-24 ENCOUNTER — Other Ambulatory Visit: Payer: Self-pay | Admitting: Hematology and Oncology

## 2019-01-24 DIAGNOSIS — Z853 Personal history of malignant neoplasm of breast: Secondary | ICD-10-CM

## 2019-01-28 ENCOUNTER — Telehealth: Payer: Self-pay

## 2019-01-28 ENCOUNTER — Encounter: Payer: Self-pay | Admitting: Hematology and Oncology

## 2019-01-28 ENCOUNTER — Ambulatory Visit: Payer: Self-pay | Admitting: Hematology and Oncology

## 2019-01-28 NOTE — Telephone Encounter (Signed)
Letter mailed regarding no show for appt today

## 2019-01-29 ENCOUNTER — Ambulatory Visit: Payer: Self-pay | Attending: Family Medicine | Admitting: Family Medicine

## 2019-01-29 ENCOUNTER — Encounter: Payer: Self-pay | Admitting: Family Medicine

## 2019-01-29 VITALS — BP 152/75 | HR 64 | Temp 98.1°F | Ht 60.0 in | Wt 161.6 lb

## 2019-01-29 DIAGNOSIS — G4700 Insomnia, unspecified: Secondary | ICD-10-CM

## 2019-01-29 DIAGNOSIS — I10 Essential (primary) hypertension: Secondary | ICD-10-CM

## 2019-01-29 DIAGNOSIS — Z794 Long term (current) use of insulin: Secondary | ICD-10-CM

## 2019-01-29 DIAGNOSIS — Z1159 Encounter for screening for other viral diseases: Secondary | ICD-10-CM

## 2019-01-29 DIAGNOSIS — E7800 Pure hypercholesterolemia, unspecified: Secondary | ICD-10-CM

## 2019-01-29 DIAGNOSIS — E1140 Type 2 diabetes mellitus with diabetic neuropathy, unspecified: Secondary | ICD-10-CM

## 2019-01-29 LAB — POCT GLYCOSYLATED HEMOGLOBIN (HGB A1C): HbA1c, POC (controlled diabetic range): 7.5 % — AB (ref 0.0–7.0)

## 2019-01-29 LAB — GLUCOSE, POCT (MANUAL RESULT ENTRY): POC GLUCOSE: 175 mg/dL — AB (ref 70–99)

## 2019-01-29 MED ORDER — INSULIN GLARGINE 100 UNIT/ML SOLOSTAR PEN: 23.0000 [IU] | PEN_INJECTOR | Freq: Two times a day (BID) | SUBCUTANEOUS | 6 refills | Status: DC

## 2019-01-29 MED ORDER — GLIPIZIDE 10 MG PO TABS: ORAL_TABLET | ORAL | 6 refills | Status: DC

## 2019-01-29 MED ORDER — GABAPENTIN 300 MG PO CAPS: 600.0000 mg | ORAL_CAPSULE | Freq: Two times a day (BID) | ORAL | 6 refills | Status: DC

## 2019-01-29 MED ORDER — TRAZODONE HCL 50 MG PO TABS: 50.0000 mg | ORAL_TABLET | Freq: Every evening | ORAL | 6 refills | Status: DC | PRN

## 2019-01-29 MED ORDER — LISINOPRIL 10 MG PO TABS: 10.0000 mg | ORAL_TABLET | Freq: Every day | ORAL | 6 refills | Status: DC

## 2019-01-29 MED ORDER — ATORVASTATIN CALCIUM 40 MG PO TABS: 40.0000 mg | ORAL_TABLET | Freq: Every day | ORAL | 6 refills | Status: DC

## 2019-01-29 MED ORDER — CARVEDILOL 12.5 MG PO TABS: ORAL_TABLET | ORAL | 6 refills | Status: DC

## 2019-01-29 MED FILL — ATORVASTATIN CALCIUM 40 MG: 40 | 30 days supply | Qty: 30 | Fill #0

## 2019-01-29 MED FILL — !LANTUS SOLOSTAR 100UNITS/M: 100 | 33 days supply | Qty: 30 | Fill #0

## 2019-01-29 MED FILL — CARVEDILOL 12.5 MG TABLET: 12.5 | 30 days supply | Qty: 60 | Fill #0

## 2019-01-29 MED FILL — glipiZIDE 10 MG TABS: 10 | 30 days supply | Qty: 60 | Fill #0

## 2019-01-29 MED FILL — LISINOPRIL 10 MG TABS: 10 | 30 days supply | Qty: 30 | Fill #0

## 2019-01-29 MED FILL — GABAPENTIN 300 MG CAPSULE: 300 | 30 days supply | Qty: 120 | Fill #0

## 2019-01-29 MED FILL — traZODone HCL 50 MG TABS: 50 | 30 days supply | Qty: 30 | Fill #0

## 2019-01-29 NOTE — Patient Instructions (Signed)
Diabetes mellitus y nutricin, en adultos Diabetes Mellitus and Nutrition, Adult Si sufre de diabetes (diabetes mellitus), es muy importante tener hbitos alimenticios saludables debido a que sus niveles de Designer, television/film set sangre (glucosa) se ven afectados en gran medida por lo que come y bebe. [place] alimentos saludables en las cantidades [place], aproximadamente a la [employer], [place] ayudar a:  Aeronautical engineer glucemia.  Disminuir el riesgo de sufrir una enfermedad cardaca.  Mejorar la presin arterial.  Science writer o mantener un peso saludable. Todas las personas que sufren de diabetes son diferentes y cada una tiene necesidades diferentes en cuanto a un plan de alimentacin. El mdico puede recomendarle que trabaje con un especialista en dietas y nutricin (nutricionista) para Financial trader plan para usted. Su plan de alimentacin puede variar segn factores como:  Las caloras que necesita.  Los medicamentos que toma.  Su peso.  Sus niveles de glucemia, presin arterial y colesterol.  Su nivel de [place].  Otras afecciones que tenga, como enfermedades cardacas o renales. Cmo me afectan los carbohidratos? Los carbohidratos, o hidratos de carbono, afectan su nivel de glucemia ms que cualquier otro tipo de alimento. La ingesta de carbohidratos naturalmente aumenta la cantidad de [employer]. El recuento de carbohidratos es un mtodo destinado a Catering manager un registro de la cantidad de carbohidratos que se consumen. El recuento de carbohidratos es importante para Theatre manager la glucemia a un nivel saludable, especialmente si utiliza insulina o toma determinados medicamentos por va oral para la diabetes. Es importante conocer la cantidad de carbohidratos que se pueden ingerir en cada comida sin correr Engineer, manufacturing. Esto es Psychologist, forensic. Su nutricionista puede ayudarlo a calcular la cantidad de carbohidratos que debe ingerir en cada comida y en cada  refrigerio. Entre los alimentos que contienen carbohidratos, se incluyen:  Pan, cereal, arroz, pastas y galletas.  Papas y maz.  Guisantes, frijoles y lentejas.  Leche y Estate agent.  Lambert Mody y [place].  Postres, como pasteles, galletas, helado y caramelos. Cmo me afecta el alcohol? El alcohol puede provocar disminuciones sbitas de la glucemia (hipoglucemia), especialmente si utiliza insulina o toma determinados medicamentos por va oral para la diabetes. La hipoglucemia es una afeccin potencialmente mortal. Los sntomas de la hipoglucemia (somnolencia, mareos y confusin) son similares a los sntomas de haber consumido demasiado alcohol. Si el mdico afirma que el alcohol es seguro para usted, [place] estas pautas:  Limite el consumo de alcohol a no ms de 26mdida por da si es mujer y no est [place] y a 219midas si es hombre. Una medida equivale a 12oz (35564mde cerveza, 5oz (148m60me vino o 1oz (44ml75m bebidas alcohlicas de alta graduacin.  No beba con el estmago vaco.  Mantngase hidratado bebiendo agua, refrescos dietticos o t helado sin azcar.  Tenga en cuenta que los refrescos comunes, los jugos y otras bebida para mezclOptician, dispensingen contener mucha azcar y se deben contar como carbohidratos. Cules son algunos consejos para seguir este plan?  Leer las etiquetas de los alimentos  Comience por leer el tamao de la porcin en la "Informacin nutricional" en las etiquetas de los alimentos envasados y las bebidas. La cantidad de caloras, carbohidratos, grasas y otros nutrientes mencionados en la etiqueta se basan en una porcin del alimento. Muchos alimentos contienen ms de una porcin por envase.  Verifique la cantidad total de gramos (g) de carbohidratos totales en una porcin. Puede calcular la cantidad de porciones de carbohidratos al dividir el  total de carbohidratos por 15. Por ejemplo, si un alimento tiene un total de 30g de carbohidratos, equivale a 2  porciones de carbohidratos.  Verifique la cantidad de gramos (g) de grasas saturadas y grasas trans en una porcin. Escoja alimentos que no contengan grasa o que tengan un bajo contenido.  Verifique la cantidad de miligramos (mg) de sal (sodio) en una porcin. [place] [employer] de las personas deben limitar la ingesta de sodio total a menos de 2325m por dTraining and development officer  Siempre consulte la informacin nutricional de los alimentos etiquetados como "con bajo contenido de grasa" o "sin grasa". Estos alimentos pueden tener un mayor contenido de aLocation manageragregada o carbohidratos refinados, y deben evitarse.  Hable con su nutricionista para identificar sus objetivos diarios en cuanto a los nutrientes mencionados en la etiqueta. Al ir de compras  Evite comprar alimentos procesados, enlatados o precocinados. Estos alimentos tienden a tSpecial educational needs teachermayor cantidad de gGerlach sodio y azcar agregada.  Compre en la zona exterior de la tienda de comestibles. Esta zona incluye frutas y verduras frescas, granos a granel, carnes frescas y productos lcteos frescos. Al cocinar  Utilice mtodos de coccin a baja temperatura, como hornear, en lugar de mtodos de coccin a alta temperatura, como frer en abundante aceite.  Cocine con aceites saludables, como el aceite de oPeebles canola o gMilton  Evite cocinar con manteca, crema o carnes con alto contenido de grasa. Planificacin de las comidas  Coma las comidas y los refrigerios regularmente, preferentemente a la misma hora todos lMorrilton Evite pasar largos perodos de tiempo sin comer.  Consuma alimentos ricos en fibra, como frutas frescas, verduras, frijoles y cereales integrales. Consulte a su nutricionista sobre cuntas porciones de carbohidratos puede consumir en cada comida.  Consuma entre 4 y 6 onzas (oz) de protenas magras por da, como carnes mEllsworth pollo, pescado, huevos o tofu. Una onza de protena magra equivale a: ? 1 onza de carne, pollo o  pescado. ? 1huevo. ?  taza de tofu.  Coma algunos alimentos por da que contengan grasas saludables, como aguacates, frutos secos, semillas y pescado. Estilo de vida  Controle su nivel de glucemia con regularidad.  Haga actividad fsica habitualmente como se lo haya indicado el mdico. Esto puede incluir lo siguiente: ? 1551mutos semanales de ejercicio de intensidad moderada o alta. Esto podra incluir caminatas dinmicas, ciclismo o gimnasia acutica. ? Realizar ejercicios de elongacin y de fortalecimiento, como yoga o levantamiento de pesas, por lo menos 2veces por semana.  Tome los meTenneco Ince lo haya indicado el mdico.  No consuma ningn producto que contenga nicotina o tabaco, como cigarrillos y ciPsychologist, sport and exerciseSi necesita ayuda para dejar de fumar, consulte al mdHess Corporationon un asesor o instructor en diabetes para identificar estrategias para controlar el estrs y cualquier desafo emocional y social. Preguntas para hacerle al mdico  Es necesario que consulte a unRadio broadcast assistantn el cuidado de la diabetes?  Es necesario que me rena con un nutricionista?  A qu nmero puedo llamar si tengo preguntas?  Cules son los mejores momentos para controlar la glucemia? Dnde encontrar ms informacin:  Asociacin Estadounidense de la Diabetes (American Diabetes Association): diabetes.org  Academia de Nutricin y DiInformation systems managerAcademy of Nutrition and Dietetics): www.eatright.orAieaiabetes y las Enfermedades Digestivas y Ren[place] (NSt John'S Episcopal Hospital South Shoref Diabetes and Digestive and Kidney Diseases, NIH): [email or web address].dkesumen  Un plan de alimentacin saludable lo ayudar a coAeronautical engineerlucemia y maTheatre managern estilo de vida saludable.  encontrar ms informacin:   Asociacin Estadounidense de la Diabetes (American Diabetes Association): diabetes.org   Academia de Nutricin y Diettica (Academy of Nutrition and Dietetics): www.eatright.org   Instituto Nacional de la Diabetes y las Enfermedades Digestivas y [place] (National Institute of Diabetes and Digestive and Kidney Diseases, NIH): www.niddk.nih.gov  Resumen   Un plan de alimentacin saludable lo ayudar a controlar la glucemia y mantener un estilo de vida saludable.   Trabajar con un especialista en dietas y nutricin (nutricionista) puede ayudarlo a elaborar el mejor plan de alimentacin para usted.   Tenga en cuenta que los carbohidratos (hidratos de  carbono) y el alcohol tienen efectos inmediatos en sus niveles de glucemia. Es importante contar los carbohidratos que ingiere y consumir alcohol con prudencia.  Esta informacin no tiene como fin reemplazar el consejo del mdico. Asegrese de hacerle al mdico cualquier pregunta que tenga.  Document Released: 02/14/2008 Document Revised: 07/18/2017 Document Reviewed: 02/27/2017  Elsevier Interactive Patient Education  2019 Elsevier Inc.

## 2019-01-29 NOTE — Progress Notes (Signed)
Subjective:  Patient ID: Brandi Bryant, female    DOB: [birth year]  Age: 63 y.o. MRN: [id number]  CC: Headache and Diabetes   HPI Brandi Bryant is a 63 year old female with a history of GERD, hypertension, hyperlipidemia, type 2 diabetes mellitus (A1c 7.5) previous CVA, TIA in 03/2018 here for a follow up visit. Her A1c is 7.5 which has increased from 6.3 previously and she has noted her fasting sugars have been elevated to the 140-150 range and were previously in the 90s.  She has not changed her lifestyle has been compliant with her medications. Denies blurry vision, numbness in extremities.  She noted on a few occasions she was lightheaded with associated cold sweats but when she checked her sugars she was not hypoglycemic. Blood pressure is elevated and she endorses compliance with her antihypertensive and also tolerant of her statin with no complaints of myalgias. She has no new weakness, chest pain or dyspnea. Sometimes has headaches but at this time she feels fine. Past Medical History:  Diagnosis Date  . Abdominal pain   . Arthralgia 08/13/2013  . Bell palsy 2006  . Bell's palsy   . Breast cancer ([place]) 2009   Left Breast Cancer  . Chest pain 09/30/2013  . Cholelithiasis   . Colon cancer screening 10/27/2015  . Complication of anesthesia    " tubal ligation, in [place]"  had weakness, couldnt stand up the next day, was given pills for oxygen for Brain."  1 month after I was having loss of attentiviness, still have to focus  carefully.   . Coronary artery disease   . Depression   . Diabetes mellitus    Type II  . Dyspnea    at times- when air conditioner is runnimg, heat also   . Encounter for screening colonoscopy 10/30/2015  . Hepatic steatosis   . History of breast cancer 06/01/2012  . HTN (hypertension)   . Personal history of chemotherapy 2009   Left Breast Cancer  . Personal history of radiation therapy 2009   Left Breast Cancer    Past Surgical  History:  Procedure Laterality Date  . ANKLE ARTHROSCOPY Right 12/14/2016   Procedure: Right Ankle Arthroscopic Debridement;  Surgeon: Newt Minion, MD;  Location: [facility];  Service: Orthopedics;  Laterality: Right;  . Arm surgery     Left tendon lengthen  . BREAST LUMPECTOMY Left 2009  . CHOLECYSTECTOMY N/A 12/09/2015   Procedure: LAPAROSCOPIC CHOLECYSTECTOMY WITH INTRAOPERATIVE CHOLANGIOGRAM;  Surgeon: Greer Pickerel, MD;  Location: [place];  Service: General;  Laterality: N/A;  . LEFT HEART CATHETERIZATION WITH CORONARY ANGIOGRAM N/A 10/30/2013   Procedure: LEFT HEART CATHETERIZATION WITH CORONARY ANGIOGRAM;  Surgeon: Josue Hector, MD;  Location: [facility] CATH LAB;  Service: Cardiovascular;  Laterality: N/A;  . porta cath Right   . Removal of Porta cath Right   . TUBAL LIGATION      Family History  Problem Relation Age of Onset  . Thyroid cancer Sister   . Hypertension Mother   . Diabetes Mother   . Migraines Daughter     Allergies  Allergen Reactions  . Gadolinium Derivatives Nausea And Vomiting    Code: VOM, Desc: Pt began vomiting 45 sec after MRI contrast injection of Multihance, Onset Date: [id number]      Outpatient Medications Prior to Visit  Medication Sig Dispense Refill  . albuterol (PROVENTIL HFA;VENTOLIN HFA) 108 (90 Base) MCG/ACT inhaler Inhale 1-2 puffs into the lungs every 6 (six) hours as needed for wheezing or  shortness of breath. 54 g 3  . aspirin 325 MG EC tablet Take 1 tablet (325 mg total) by mouth daily. 30 tablet 6  . imiquimod (ALDARA) 5 % cream Apply topically 3 (three) times a week. 12 each 1  . TRUEPLUS PEN NEEDLES 31G X 6 MM MISC USE FOR NIGHTLY INSULIN INJECTIONS 100 each 11  . atorvastatin (LIPITOR) 40 MG tablet Take 1 tablet (40 mg total) by mouth daily. 30 tablet 6  . carvedilol (COREG) 12.5 MG tablet TAKE 1 TABLET BY MOUTH 2 TIMES DAILY WITH A MEAL. 60 tablet 6  . gabapentin (NEURONTIN) 300 MG capsule Take 2 capsules (600 mg total) by mouth 2 (two) times  daily. 60 capsule 6  . glipiZIDE (GLUCOTROL) 10 MG tablet TAKE 1 TABLET BY MOUTH 2 TIMES DAILY BEFORE A MEAL. 60 tablet 6  . Insulin Glargine (LANTUS SOLOSTAR) 100 UNIT/ML Solostar Pen Inject 18 Units into the skin 2 (two) times daily. 15 mL 6  . lisinopril (PRINIVIL,ZESTRIL) 5 MG tablet Take 1 tablet (5 mg total) by mouth daily. 90 tablet 3  . traZODone (DESYREL) 50 MG tablet Take 1 tablet (50 mg total) by mouth at bedtime as needed for sleep. 30 tablet 6  . cetirizine (ZYRTEC) 10 MG tablet Take 1 tablet (10 mg total) by mouth daily. (Patient not taking: Reported on 08/15/2018) 30 tablet 1  . hydrocortisone-pramoxine (ANALPRAM HC) 2.5-1 % rectal cream Place 1 application rectally 3 (three) times daily. (Patient not taking: Reported on 01/29/2019) 30 g 1  . pantoprazole (PROTONIX) 40 MG tablet Take 1 tablet (40 mg total) by mouth daily. (Patient not taking: Reported on 10/30/2018) 30 tablet 6  . promethazine (PHENERGAN) 25 MG tablet Take 1 tablet (25 mg total) by mouth every 8 (eight) hours as needed for nausea or vomiting. (Patient not taking: Reported on 01/29/2019) 20 tablet 0   No facility-administered medications prior to visit.      ROS Review of Systems General: negative for fever, weight loss, appetite change Eyes: no visual symptoms. ENT: no ear symptoms, no sinus tenderness, no nasal congestion or sore throat. Neck: no pain  Respiratory: no wheezing, shortness of breath, cough Cardiovascular: no chest pain, no dyspnea on exertion, no pedal edema, no orthopnea. Gastrointestinal: no abdominal pain, no diarrhea, no constipation Genito-Urinary: no urinary frequency, no dysuria, no polyuria. Hematologic: no bruising Endocrine: no cold or heat intolerance Neurological: no headaches, no seizures, no tremors Musculoskeletal: no joint pains, no joint swelling Skin: no pruritus, no rash. Psychological: no depression, no anxiety,    Objective:  BP (!) 152/75   Pulse 64   Temp 98.1 F  (36.7 C) (Oral)   Ht 5' (1.524 m)   Wt 161 lb 9.6 oz (73.3 kg)   SpO2 95%   BMI 31.56 kg/m   BP/Weight 01/29/2019 10/30/2018 05/17/349  Systolic BP 093 818 299  Diastolic BP 75 73 70  Wt. (Lbs) 161.6 160.4 160.8  BMI 31.56 31.33 31.4      Physical Exam Constitutional: normal appearing,  Eyes: PERRLA HEENT: Head is atraumatic, normal sinuses, normal oropharynx, normal appearing tonsils and palate, tympanic membrane is normal bilaterally. Neck: normal range of motion, no thyromegaly, no JVD Cardiovascular: normal rate and rhythm, normal heart sounds, no murmurs, rub or gallop, no pedal edema Respiratory: Normal breath sounds, clear to auscultation bilaterally, no wheezes, no rales, no rhonchi Abdomen: soft, not tender to palpation, normal bowel sounds, no enlarged organs Musculoskeletal: Full ROM, no tenderness in joints Skin: warm  and dry, no lesions. Neurological: alert, oriented x3, cranial nerves I-XII grossly intact , normal motor strength, normal sensation. Psychological: normal mood.   CMP Latest Ref Rng & Units 06/14/2018 03/30/2018 03/30/2018  Glucose 65 - 99 mg/dL 201(H) 188(H) 190(H)  BUN 8 - 27 mg/dL _0 Creatinine 0.57 - 1.00 mg/dL 0.87 0.90 0.93  Sodium 134 - 144 mmol/L 141 143 143  Potassium 3.5 - 5.2 mmol/L 3.8 3.9 3.9  Chloride 96 - 106 mmol/L 104 107 110  CO2 20 - 29 mmol/L 21 - 26  Calcium 8.7 - 10.3 mg/dL 9.2 - 9.4  Total Protein 6.5 - 8.1 g/dL - - 7.1  Total Bilirubin 0.3 - 1.2 mg/dL - - 0.8  Alkaline Phos 38 - 126 U/L - - 116  AST 15 - 41 U/L - - 34  ALT 14 - 54 U/L - - 25    Lipid Panel     Component Value Date/Time   CHOL 107 03/31/2018 0348   CHOL 130 11/28/2017 0937   TRIG 167 (H) 03/31/2018 0348   HDL 41 03/31/2018 0348   HDL 40 11/28/2017 0937   CHOLHDL 2.6 03/31/2018 0348   VLDL 33 03/31/2018 0348   LDLCALC 33 03/31/2018 0348   LDLCALC 56 11/28/2017 0937    CBC    Component Value Date/Time   WBC 7.3 03/30/2018 1521   RBC  4.46 03/30/2018 1521   HGB 13.3 03/30/2018 1542   HGB 13.4 09/30/2013 1033   HCT 39.0 03/30/2018 1542   HCT 39.1 09/30/2013 1033   PLT 167 03/30/2018 1521   PLT 150 09/30/2013 1033   MCV 88.1 03/30/2018 1521   MCV 88.5 09/30/2013 1033   MCH 30.5 03/30/2018 1521   MCHC 34.6 03/30/2018 1521   RDW 12.5 03/30/2018 1521   RDW 12.8 09/30/2013 1033   LYMPHSABS 2.7 03/30/2018 1521   LYMPHSABS 2.1 09/30/2013 1033   MONOABS 0.8 03/30/2018 1521   MONOABS 0.4 09/30/2013 1033   EOSABS 0.6 03/30/2018 1521   EOSABS 0.2 09/30/2013 1033   BASOSABS 0.0 03/30/2018 1521   BASOSABS 0.0 09/30/2013 1033    Lab Results  Component Value Date   HGBA1C 7.5 (A) 01/29/2019    Assessment & Plan:   1. Type 2 diabetes mellitus with diabetic neuropathy, with long-term current use of insulin (HCC) A1c of 7.5 which is above goal of less than 7.0 and has trended up from 6.3 previously Increase Lantus to 23 units twice daily Discussed management of hypoglycemia-she does not seem to be hypoglycemic at this time. Counseled on Diabetic diet, my plate method, 324 minutes of moderate intensity exercise/week Keep blood sugar logs with fasting goals of 80-120 mg/dl, random of less than 180 and in the event of sugars less than 60 mg/dl or greater than 400 mg/dl please notify the clinic ASAP. It is recommended that you undergo annual eye exams and annual foot exams. Pneumonia vaccine is recommended. - POCT glucose (manual entry) - POCT glycosylated hemoglobin (Hb A1C) - Microalbumin/Creatinine Ratio, Urine; Future - Insulin Glargine (LANTUS SOLOSTAR) 100 UNIT/ML Solostar Pen; Inject 23 Units into the skin 2 (two) times daily.  Dispense: 30 mL; Refill: 6 - glipiZIDE (GLUCOTROL) 10 MG tablet; TAKE 1 TABLET BY MOUTH 2 TIMES DAILY BEFORE A MEAL.  Dispense: 60 tablet; Refill: 6 - gabapentin (NEURONTIN) 300 MG capsule; Take 2 capsules (600 mg total) by mouth 2 (two) times daily.  Dispense: 120 capsule; Refill: 6  2.  Essential hypertension Uncontrolled Increased dose  of lisinopril Counseled on blood pressure goal of less than 130/80, low-sodium, DASH diet, medication compliance, 150 minutes of moderate intensity exercise per week. Discussed medication compliance, adverse effects. - CMP14+EGFR; Future - Lipid panel; Future - lisinopril (PRINIVIL,ZESTRIL) 10 MG tablet; Take 1 tablet (10 mg total) by mouth daily.  Dispense: 30 tablet; Refill: 6 - carvedilol (COREG) 12.5 MG tablet; TAKE 1 TABLET BY MOUTH 2 TIMES DAILY WITH A MEAL.  Dispense: 60 tablet; Refill: 6  3. Pure hypercholesterolemia Controlled Low-cholesterol diet - atorvastatin (LIPITOR) 40 MG tablet; Take 1 tablet (40 mg total) by mouth daily.  Dispense: 30 tablet; Refill: 6  4. Insomnia, unspecified type Controlled - traZODone (DESYREL) 50 MG tablet; Take 1 tablet (50 mg total) by mouth at bedtime as needed for sleep.  Dispense: 30 tablet; Refill: 6  5. Screening for viral disease - Hepatitis c antibody (reflex); Future   Meds ordered this encounter  Medications  . lisinopril (PRINIVIL,ZESTRIL) 10 MG tablet    Sig: Take 1 tablet (10 mg total) by mouth daily.    Dispense:  30 tablet    Refill:  6  . Insulin Glargine (LANTUS SOLOSTAR) 100 UNIT/ML Solostar Pen    Sig: Inject 23 Units into the skin 2 (two) times daily.    Dispense:  30 mL    Refill:  6  . glipiZIDE (GLUCOTROL) 10 MG tablet    Sig: TAKE 1 TABLET BY MOUTH 2 TIMES DAILY BEFORE A MEAL.    Dispense:  60 tablet    Refill:  6  . carvedilol (COREG) 12.5 MG tablet    Sig: TAKE 1 TABLET BY MOUTH 2 TIMES DAILY WITH A MEAL.    Dispense:  60 tablet    Refill:  6  . atorvastatin (LIPITOR) 40 MG tablet    Sig: Take 1 tablet (40 mg total) by mouth daily.    Dispense:  30 tablet    Refill:  6  . gabapentin (NEURONTIN) 300 MG capsule    Sig: Take 2 capsules (600 mg total) by mouth 2 (two) times daily.    Dispense:  120 capsule    Refill:  6    Discontinue previous dose  .  traZODone (DESYREL) 50 MG tablet    Sig: Take 1 tablet (50 mg total) by mouth at bedtime as needed for sleep.    Dispense:  30 tablet    Refill:  6    Follow-up: Return in about 1 month (around 03/01/2019) for complete physical exam.       Charlott Rakes, MD, FAAFP. [facility], [place]   01/29/2019, 4:02 PM

## 2019-01-31 ENCOUNTER — Ambulatory Visit: Payer: Self-pay | Attending: Family Medicine

## 2019-01-31 ENCOUNTER — Other Ambulatory Visit: Payer: Self-pay

## 2019-01-31 DIAGNOSIS — I10 Essential (primary) hypertension: Secondary | ICD-10-CM

## 2019-01-31 DIAGNOSIS — E1140 Type 2 diabetes mellitus with diabetic neuropathy, unspecified: Secondary | ICD-10-CM

## 2019-01-31 DIAGNOSIS — Z1159 Encounter for screening for other viral diseases: Secondary | ICD-10-CM

## 2019-01-31 DIAGNOSIS — Z794 Long term (current) use of insulin: Principal | ICD-10-CM

## 2019-02-01 ENCOUNTER — Encounter: Payer: Self-pay | Admitting: Family Medicine

## 2019-02-01 LAB — CMP14+EGFR
ALT: 20 IU/L (ref 0–32)
AST: 29 IU/L (ref 0–40)
Albumin/Globulin Ratio: 1.3 (ref 1.2–2.2)
Albumin: 3.8 g/dL (ref 3.8–4.8)
Alkaline Phosphatase: 107 IU/L (ref 39–117)
BUN/Creatinine Ratio: 15 (ref 12–28)
BUN: 11 mg/dL (ref 8–27)
Bilirubin Total: 1.1 mg/dL (ref 0.0–1.2)
CO2: 22 mmol/L (ref 20–29)
Calcium: 9.1 mg/dL (ref 8.7–10.3)
Chloride: 104 mmol/L (ref 96–106)
Creatinine, Ser: 0.74 mg/dL (ref 0.57–1.00)
GFR calc Af Amer: 101 mL/min/{1.73_m2} (ref >59)
GFR calc non Af Amer: 88 mL/min/{1.73_m2} (ref >59)
Globulin, Total: 2.9 g/dL (ref 1.5–4.5)
Glucose: 141 mg/dL — ABNORMAL HIGH (ref 65–99)
Potassium: 4.3 mmol/L (ref 3.5–5.2)
Sodium: 144 mmol/L (ref 134–144)
Total Protein: 6.7 g/dL (ref 6.0–8.5)

## 2019-02-01 LAB — HEPATITIS C ANTIBODY (REFLEX): HCV Ab: <0.1 s/co ratio (ref 0.0–0.9)

## 2019-02-01 LAB — HCV COMMENT:

## 2019-02-01 LAB — LIPID PANEL
Chol/HDL Ratio: 2.3 ratio (ref 0.0–4.4)
Cholesterol, Total: 98 mg/dL — ABNORMAL LOW (ref 100–199)
HDL: 43 mg/dL (ref >39)
LDL Calculated: 36 mg/dL (ref 0–99)
Triglycerides: 94 mg/dL (ref 0–149)
VLDL Cholesterol Cal: 19 mg/dL (ref 5–40)

## 2019-02-01 LAB — MICROALBUMIN / CREATININE URINE RATIO
CREATININE, UR: 49.4 mg/dL
MICROALBUM., U, RANDOM: 7.4 ug/mL
Microalb/Creat Ratio: 15 mg/g creat (ref 0–29)

## 2019-02-08 ENCOUNTER — Telehealth: Payer: Self-pay

## 2019-02-08 NOTE — Telephone Encounter (Signed)
-----   Message from Charlott Rakes, MD sent at 02/04/2019 11:28 AM EDT ----- Please inform the patient that labs are normal. Thank you.

## 2019-02-08 NOTE — Telephone Encounter (Signed)
Patient was called and informed of lab results. 

## 2019-03-02 IMAGING — MR MR MRA HEAD W/O CM
10 of 12 series · 32 of 48 positions shown · non-contrast
Comparison: None.

CLINICAL DATA: Focal neuro deficit, > 6 hrs, stroke suspected;
Stroke, follow up. Left facial numbness.

EXAM:
MRI HEAD WITHOUT CONTRAST
MRA HEAD WITHOUT CONTRAST
TECHNIQUE: Multiplanar, multiecho pulse sequences of the brain and surrounding
structures were obtained without intravenous contrast. Angiographic
images of the head were obtained using MRA technique without
contrast.

[Series 5001: ax dwi_tracew · axial · 3.0mm · 1.50mm/px · z∈[-83,+63]mm · 5 of 88 slices shown]
[im 1/88]
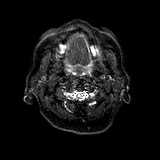
[im 22/88]
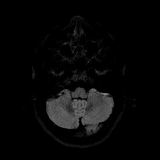
[im 44/88]
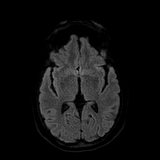
[im 66/88]
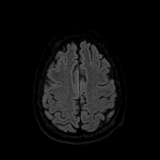
[im 88/88]
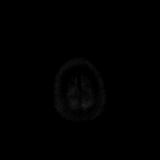

[Series 6001: ax dwi_adc · axial · 3.0mm · 1.50mm/px · z∈[-83,+63]mm · 3 of 44 slices shown]
[im 1/44]
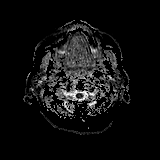
[im 22/44]
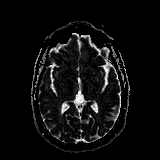
[im 44/44]
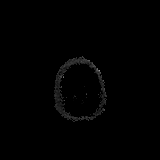

[Series 7001: cor dwi_tracew · coronal · 5.0mm · 1.44mm/px · 5 of 72 slices shown]
[im 1/72]
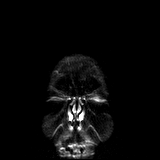
[im 18/72]
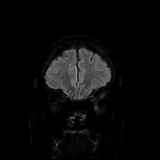
[im 36/72]
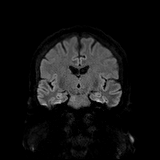
[im 54/72]
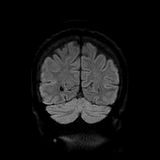
[im 72/72]
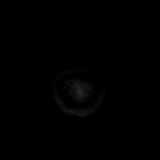

[Series 8001: cor dwi_adc · coronal · 5.0mm · 1.44mm/px · 3 of 36 slices shown]
[im 1/36]
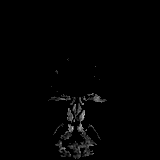
[im 18/36]
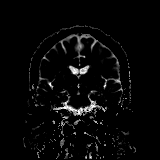
[im 36/36]
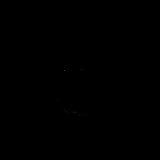

[T1 · sagittal · 5.0mm · 0.75mm/px · 2 of 23 slices shown]
[im 1/23]
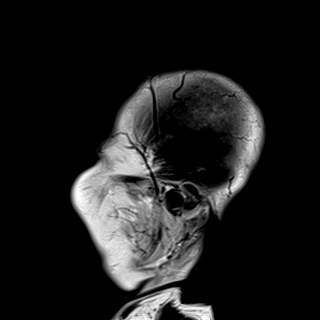
[im 23/23]
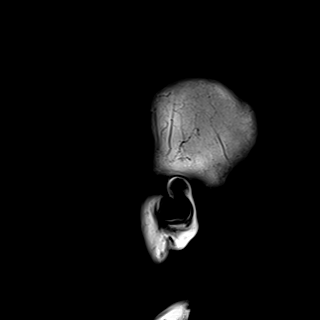

[T2 · axial · 5.0mm · 0.69mm/px · z∈[-77,+65]mm · 2 of 25 slices shown (1 of 2)]
[im 1/25]
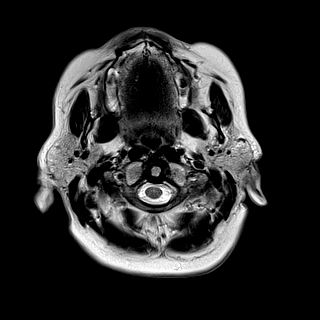
[im 25/25]
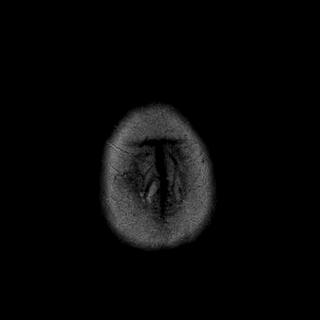

[FLAIR · axial · 5.0mm · 0.43mm/px · z∈[-76,+65]mm · 2 of 25 slices shown]
[im 1/25]
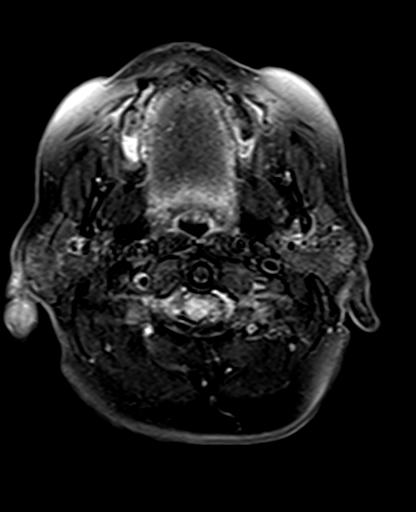
[im 25/25]
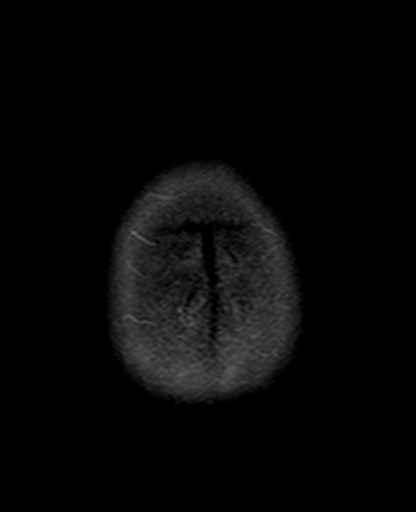

[swi_images · axial · 3.0mm · 0.86mm/px · z∈[-90,+83]mm · 4 of 60 slices shown]
[im 1/60]
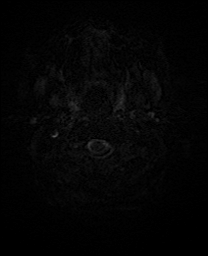
[im 20/60]
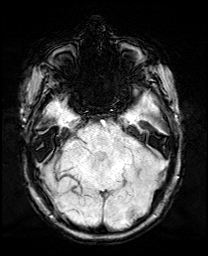
[im 40/60]
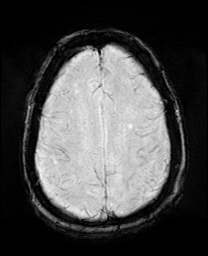
[im 60/60]
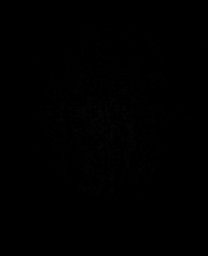

[mip_images(sw) · axial · 24.0mm · 0.86mm/px · z∈[-80,+73]mm · 4 of 53 slices shown]
[im 1/53]
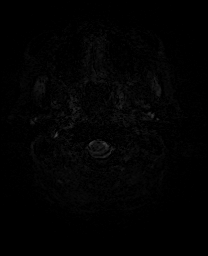
[im 18/53]
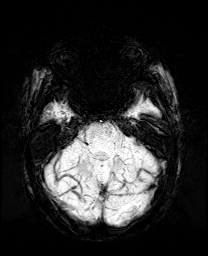
[im 35/53]
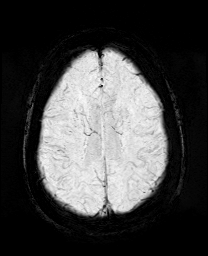
[im 53/53]
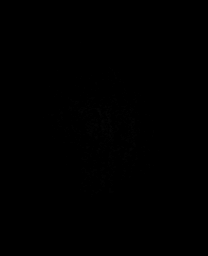

[T2 · coronal · 5.0mm · 0.34mm/px · 2 of 27 slices shown (2 of 2)]
[im 1/27]
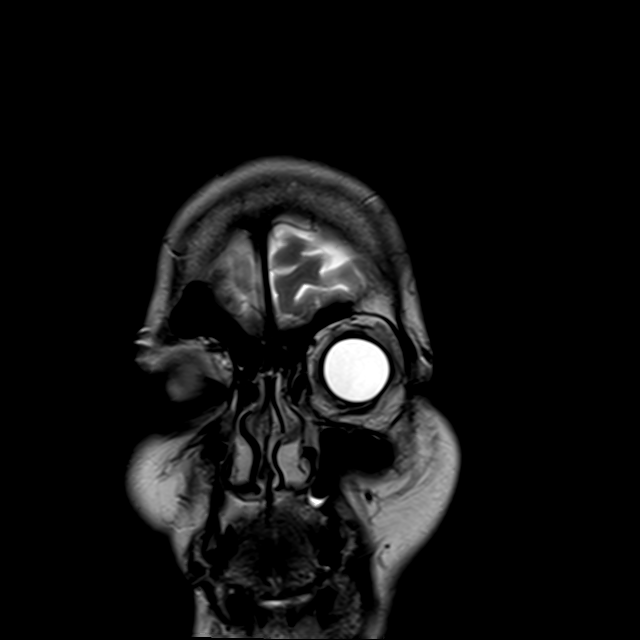
[im 27/27]
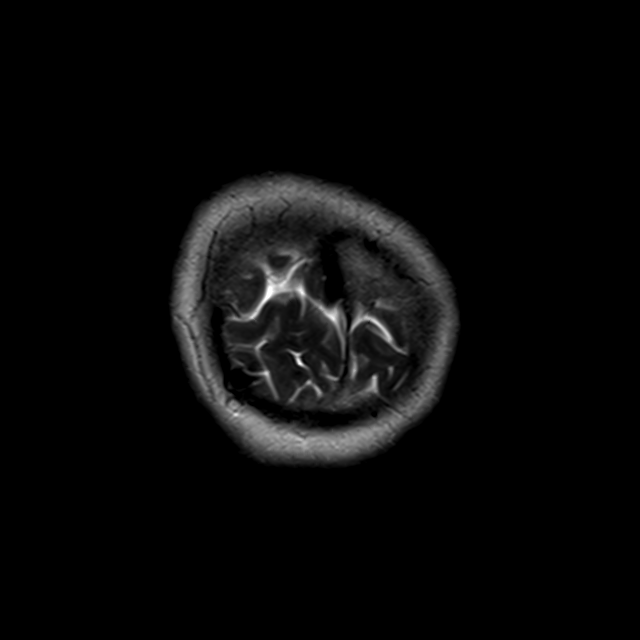

[32 of 48 positions shown; findings below may reference images not displayed]

FINDINGS: MRI HEAD FINDINGS

Brain: The midline structures are normal. No focal diffusion
restriction to indicate acute infarct. No intraparenchymal
hemorrhage. Mild periventricular white matter hyperintensity
compatible with chronic small vessel disease. Old left occipital
lobe infarct. Prominent perivascular space of the left lentiform
nucleus. No mass lesion. No chronic microhemorrhage or cerebral
amyloid angiopathy. No hydrocephalus, age advanced atrophy or lobar
predominant volume loss. No dural abnormality or extra-axial
collection.

Skull and upper cervical spine: The visualized skull base,
calvarium, upper cervical spine and extracranial soft tissues are
normal.

Sinuses/Orbits: No fluid levels or advanced mucosal thickening. No
mastoid effusion. Normal orbits.

MRA HEAD FINDINGS

Intracranial internal carotid arteries: There is severe narrowing of
the right distal cavernous segment and left clinoid segment.

Anterior cerebral arteries: Normal.

Middle cerebral arteries: Normal.

Posterior communicating arteries: Present bilaterally.

Posterior cerebral arteries: Normal.

Basilar artery: Normal.

Vertebral arteries: Left dominant. Normal.

Superior cerebellar arteries: Normal.

Anterior inferior cerebellar arteries: Normal right. Not clearly
seen on the left.

Posterior inferior cerebellar arteries: Normal.
IMPRESSION: 1. No acute infarct.
2. Mild findings of chronic microvascular ischemia and old left
occipital lobe infarct.
3. Severe stenosis of both distal internal carotid arteries
involving the cavernous and clinoid segments.
4. No other intracranial stenosis.

## 2019-03-02 MED FILL — LISINOPRIL 10 MG TABS: 10 | 30 days supply | Qty: 30 | Fill #1

## 2019-03-02 MED FILL — ATORVASTATIN CALCIUM 40 MG: 40 | 30 days supply | Qty: 30 | Fill #1

## 2019-03-02 MED FILL — glipiZIDE 10 MG TABS: 10 | 30 days supply | Qty: 60 | Fill #1

## 2019-03-02 MED FILL — CARVEDILOL 12.5 MG TABLET: 12.5 | 30 days supply | Qty: 60 | Fill #1

## 2019-03-02 MED FILL — GABAPENTIN 300 MG CAPSULE: 300 | 30 days supply | Qty: 120 | Fill #1

## 2019-03-02 MED FILL — traZODone HCL 50 MG TABS: 50 | 30 days supply | Qty: 30 | Fill #1

## 2019-03-02 MED FILL — LANTUS SOLOSTAR 100 UNITS/M: 100 | 33 days supply | Qty: 30 | Fill #1

## 2019-03-15 ENCOUNTER — Other Ambulatory Visit (HOSPITAL_COMMUNITY): Payer: Self-pay | Admitting: *Deleted

## 2019-03-15 DIAGNOSIS — Z1231 Encounter for screening mammogram for malignant neoplasm of breast: Secondary | ICD-10-CM

## 2019-03-26 ENCOUNTER — Ambulatory Visit: Payer: Self-pay | Attending: Family Medicine | Admitting: Family Medicine

## 2019-03-26 ENCOUNTER — Encounter: Payer: Self-pay | Admitting: Family Medicine

## 2019-03-26 ENCOUNTER — Other Ambulatory Visit: Payer: Self-pay

## 2019-03-26 DIAGNOSIS — J302 Other seasonal allergic rhinitis: Secondary | ICD-10-CM

## 2019-03-26 DIAGNOSIS — J328 Other chronic sinusitis: Secondary | ICD-10-CM

## 2019-03-26 MED ORDER — FLUTICASONE PROPIONATE 50 MCG/ACT NA SUSP: 2.0000 | Freq: Every day | NASAL | 6 refills | Status: DC

## 2019-03-26 MED ORDER — OLOPATADINE HCL 0.1 % OP SOLN: 1.0000 [drp] | Freq: Two times a day (BID) | OPHTHALMIC | 2 refills | Status: DC

## 2019-03-26 MED ORDER — BENZONATATE 100 MG PO CAPS: 100.0000 mg | ORAL_CAPSULE | Freq: Three times a day (TID) | ORAL | 0 refills | Status: DC | PRN

## 2019-03-26 MED FILL — OLOPATADINE HCL 0.1 % SOLN: 0.1 | 18 days supply | Qty: 5 | Fill #0

## 2019-03-26 MED FILL — BENZONATATE 100 MG CAPS: 100 | 6 days supply | Qty: 20 | Fill #0

## 2019-03-26 MED FILL — FLUTICASONE PROP 50 MCG SPR: 50 | 30 days supply | Qty: 16 | Fill #0

## 2019-03-26 NOTE — Progress Notes (Signed)
Virtual Visit via Telephone Note  I connected with Brandi Bryant, on 03/26/2019 at 4:00 PM by telephone due to the COVID-19 pandemic and verified that I am speaking with the correct person using two identifiers.   Consent: I discussed the limitations, risks, security and privacy concerns of performing an evaluation and management service by telephone and the availability of in person appointments. I also discussed with the patient that there may be a patient responsible charge related to this service. The patient expressed understanding and agreed to proceed.   Location of Patient: Home  Location of Provider: Clinic   Persons participating in Telemedicine visit: Fernando Zamora-Quintanilla  [facility] - CMA Dr Margarita Rana - PCP     History of Present Illness: Brandi Bryant is a 63 year old female with a history of GERD, hypertension, hyperlipidemia, type 2 diabetes mellitus (A1c 7.5) previous CVA, TIA in 03/2018 here for an acute visit. She complains of allergies which have bothered her for the last 1 month.  Symptoms include cough, dry throat, nasal congestion, itchy eyes and cough is worse at night and is sometimes dry at other times productive.  Zyrtec is provided no relief and Allegra helps only in the mornings but symptoms return at night. She denies fever or myalgias.   Past Medical History:  Diagnosis Date  . Abdominal pain   . Arthralgia 08/13/2013  . Bell palsy 2006  . Bell's palsy   . Breast cancer ([place]) 2009   Left Breast Cancer  . Chest pain 09/30/2013  . Cholelithiasis   . Colon cancer screening 10/27/2015  . Complication of anesthesia    " tubal ligation, in [place]"  had weakness, couldnt stand up the next day, was given pills for oxygen for Brain."  1 month after I was having loss of attentiviness, still have to focus  carefully.   . Coronary artery disease   . Depression   . Diabetes mellitus    Type II  . Dyspnea    at times- when air  conditioner is runnimg, heat also   . Encounter for screening colonoscopy 10/30/2015  . Hepatic steatosis   . History of breast cancer 06/01/2012  . HTN (hypertension)   . Personal history of chemotherapy 2009   Left Breast Cancer  . Personal history of radiation therapy 2009   Left Breast Cancer   Allergies  Allergen Reactions  . Gadolinium Derivatives Nausea And Vomiting    Code: VOM, Desc: Pt began vomiting 45 sec after MRI contrast injection of Multihance, Onset Date: [id number]      Current Outpatient Medications on File Prior to Visit  Medication Sig Dispense Refill  . albuterol (PROVENTIL HFA;VENTOLIN HFA) 108 (90 Base) MCG/ACT inhaler Inhale 1-2 puffs into the lungs every 6 (six) hours as needed for wheezing or shortness of breath. 54 g 3  . aspirin 325 MG EC tablet Take 1 tablet (325 mg total) by mouth daily. 30 tablet 6  . atorvastatin (LIPITOR) 40 MG tablet Take 1 tablet (40 mg total) by mouth daily. 30 tablet 6  . carvedilol (COREG) 12.5 MG tablet TAKE 1 TABLET BY MOUTH 2 TIMES DAILY WITH A MEAL. 60 tablet 6  . gabapentin (NEURONTIN) 300 MG capsule Take 2 capsules (600 mg total) by mouth 2 (two) times daily. 120 capsule 6  . glipiZIDE (GLUCOTROL) 10 MG tablet TAKE 1 TABLET BY MOUTH 2 TIMES DAILY BEFORE A MEAL. 60 tablet 6  . imiquimod (ALDARA) 5 % cream Apply topically 3 (three) times a week. 12  each 1  . Insulin Glargine (LANTUS SOLOSTAR) 100 UNIT/ML Solostar Pen Inject 23 Units into the skin 2 (two) times daily. 30 mL 6  . lisinopril (PRINIVIL,ZESTRIL) 10 MG tablet Take 1 tablet (10 mg total) by mouth daily. 30 tablet 6  . traZODone (DESYREL) 50 MG tablet Take 1 tablet (50 mg total) by mouth at bedtime as needed for sleep. 30 tablet 6  . TRUEPLUS PEN NEEDLES 31G X 6 MM MISC USE FOR NIGHTLY INSULIN INJECTIONS 100 each 11  . cetirizine (ZYRTEC) 10 MG tablet Take 1 tablet (10 mg total) by mouth daily. (Patient not taking: Reported on 03/26/2019) 30 tablet 1  .  hydrocortisone-pramoxine (ANALPRAM HC) 2.5-1 % rectal cream Place 1 application rectally 3 (three) times daily. (Patient not taking: Reported on 01/29/2019) 30 g 1  . pantoprazole (PROTONIX) 40 MG tablet Take 1 tablet (40 mg total) by mouth daily. (Patient not taking: Reported on 10/30/2018) 30 tablet 6  . promethazine (PHENERGAN) 25 MG tablet Take 1 tablet (25 mg total) by mouth every 8 (eight) hours as needed for nausea or vomiting. (Patient not taking: Reported on 01/29/2019) 20 tablet 0  . [DISCONTINUED] escitalopram (LEXAPRO) 10 MG tablet Take 10 mg by mouth daily.     No current facility-administered medications on file prior to visit.     Observations/Objective: Awake, alert, oriented x3 Not in acute distress  CMP Latest Ref Rng & Units 01/31/2019 06/14/2018 03/30/2018  Glucose 65 - 99 mg/dL 141(H) 201(H) 188(H)  BUN 8 - 27 mg/dL 11 11 17   Creatinine 0.57 - 1.00 mg/dL 0.74 0.87 0.90  Sodium 134 - 144 mmol/L 144 141 143  Potassium 3.5 - 5.2 mmol/L 4.3 3.8 3.9  Chloride 96 - 106 mmol/L 104 104 107  CO2 20 - 29 mmol/L 22 21 -  Calcium 8.7 - 10.3 mg/dL 9.1 9.2 -  Total Protein 6.0 - 8.5 g/dL 6.7 - -  Total Bilirubin 0.0 - 1.2 mg/dL 1.1 - -  Alkaline Phos 39 - 117 IU/L 107 - -  AST 0 - 40 IU/L 29 - -  ALT 0 - 32 IU/L 20 - -    Lipid Panel     Component Value Date/Time   CHOL 98 (L) 01/31/2019 1058   TRIG 94 01/31/2019 1058   HDL 43 01/31/2019 1058   CHOLHDL 2.3 01/31/2019 1058   CHOLHDL 2.6 03/31/2018 0348   VLDL 33 03/31/2018 0348   LDLCALC 36 01/31/2019 1058    Lab Results  Component Value Date   HGBA1C 7.5 (A) 01/29/2019     Assessment and Plan: 1. Other chronic sinusitis Uncontrolled - fluticasone (FLONASE) 50 MCG/ACT nasal spray; Place 2 sprays into both nostrils daily.  Dispense: 16 g; Refill: 6 - benzonatate (TESSALON) 100 MG capsule; Take 1 capsule (100 mg total) by mouth 3 (three) times daily as needed for cough.  Dispense: 20 capsule; Refill: 0  2. Seasonal  allergies Uncontrolled Continue with Allegra, will add Flonase and Patanol - fluticasone (FLONASE) 50 MCG/ACT nasal spray; Place 2 sprays into both nostrils daily.  Dispense: 16 g; Refill: 6 - olopatadine (PATANOL) 0.1 % ophthalmic solution; Place 1 drop into both eyes 2 (two) times daily.  Dispense: 5 mL; Refill: 2   Follow Up Instructions: Return in about 3 months (around 06/26/2019).    I discussed the assessment and treatment plan with the patient. The patient was provided an opportunity to ask questions and all were answered. The patient agreed with the plan and demonstrated  an understanding of the instructions.   The patient was advised to call back or seek an in-person evaluation if the symptoms worsen or if the condition fails to improve as anticipated.     I provided 15 minutes total of non-face-to-face time during this encounter including median intraservice time, reviewing previous notes, labs, imaging, medications and explaining diagnosis and management.     Charlott Rakes, MD, FAAFP. [facility] [place], [place]   03/26/2019, 4:00 PM

## 2019-03-26 NOTE — Progress Notes (Signed)
Patient has been called and DOB has been verified. Patient has been screened and transferred to PCP to start phone visit.     

## 2019-04-03 MED FILL — LISINOPRIL 10 MG TABS: 10 | 30 days supply | Qty: 30 | Fill #2

## 2019-04-03 MED FILL — glipiZIDE 10 MG TABS: 10 | 30 days supply | Qty: 60 | Fill #2

## 2019-04-03 MED FILL — CARVEDILOL 12.5 MG TABLET: 12.5 | 30 days supply | Qty: 60 | Fill #2

## 2019-04-17 DIAGNOSIS — M778 Other enthesopathies, not elsewhere classified: Secondary | ICD-10-CM | POA: Insufficient documentation

## 2019-04-17 DIAGNOSIS — M1812 Unilateral primary osteoarthritis of first carpometacarpal joint, left hand: Secondary | ICD-10-CM | POA: Insufficient documentation

## 2019-05-02 MED FILL — CARVEDILOL 12.5 MG TABLET: 12.5 | 30 days supply | Qty: 60 | Fill #3

## 2019-05-02 MED FILL — LISINOPRIL 10 MG TABS: 10 | 30 days supply | Qty: 30 | Fill #3

## 2019-05-02 MED FILL — glipiZIDE 10 MG TABS: 10 | 30 days supply | Qty: 60 | Fill #3

## 2019-06-04 MED FILL — LISINOPRIL 10 MG TABS: 10 | 30 days supply | Qty: 30 | Fill #4

## 2019-06-04 MED FILL — CARVEDILOL 12.5 MG TABLET: 12.5 | 30 days supply | Qty: 60 | Fill #4

## 2019-06-04 MED FILL — ATORVASTATIN CALCIUM 40 MG: 40 | 30 days supply | Qty: 30 | Fill #2

## 2019-06-04 MED FILL — glipiZIDE 10 MG TABS: 10 | 30 days supply | Qty: 60 | Fill #4

## 2019-07-09 MED FILL — glipiZIDE 10 MG TABS: 10 | 30 days supply | Qty: 60 | Fill #5

## 2019-07-09 MED FILL — LISINOPRIL 10 MG TABS: 10 | 30 days supply | Qty: 30 | Fill #5

## 2019-07-09 MED FILL — CARVEDILOL 12.5 MG TABLET: 12.5 | 30 days supply | Qty: 60 | Fill #5

## 2019-07-09 MED FILL — ATORVASTATIN CALCIUM 40 MG: 40 | 30 days supply | Qty: 30 | Fill #3

## 2019-07-09 MED FILL — LANTUS SOLOSTAR 100 UNITS/M: 100 | 33 days supply | Qty: 30 | Fill #2

## 2019-08-06 ENCOUNTER — Ambulatory Visit (HOSPITAL_COMMUNITY)
Admission: RE | Admit: 2019-08-06 | Discharge: 2019-08-06 | Disposition: A | Discharge status: Home / Self Care | Payer: Self-pay | Source: Ambulatory Visit | Attending: Obstetrics and Gynecology | Admitting: Obstetrics and Gynecology

## 2019-08-06 ENCOUNTER — Other Ambulatory Visit (HOSPITAL_COMMUNITY): Payer: Self-pay | Admitting: *Deleted

## 2019-08-06 ENCOUNTER — Encounter (HOSPITAL_COMMUNITY): Payer: Self-pay

## 2019-08-06 ENCOUNTER — Other Ambulatory Visit: Payer: Self-pay

## 2019-08-06 ENCOUNTER — Ambulatory Visit: Payer: Self-pay

## 2019-08-06 DIAGNOSIS — N644 Mastodynia: Secondary | ICD-10-CM

## 2019-08-06 DIAGNOSIS — Z01419 Encounter for gynecological examination (general) (routine) without abnormal findings: Secondary | ICD-10-CM | POA: Insufficient documentation

## 2019-08-06 MED FILL — !LANTUS SOLOSTAR 100UNITS/M: 100 | 32 days supply | Qty: 15 | Fill #3

## 2019-08-06 MED FILL — ATORVASTATIN CALCIUM 40 MG: 40 | 30 days supply | Qty: 30 | Fill #4

## 2019-08-06 MED FILL — LISINOPRIL 10 MG TABS: 10 | 30 days supply | Qty: 30 | Fill #6

## 2019-08-06 MED FILL — glipiZIDE 10 MG TABS: 10 | 30 days supply | Qty: 60 | Fill #6

## 2019-08-06 MED FILL — CARVEDILOL 12.5 MG TABLET: 12.5 | 30 days supply | Qty: 60 | Fill #6

## 2019-08-06 NOTE — Progress Notes (Addendum)
Complaints of diffuse right breast pain x 3-4 months that comes and goes. Patient rates the pain at a 7 out of 10.  Pap Smear: Pap smear completed today. Last Pap smear was 03/06/2014 at [facility] and normal with negative HPV. Per patient has no history of an abnormal Pap smear. Last Pap smear result is in Epic.  Physical exam: Breasts Right breast larger than left breast due to patients history of a left breast lumpectomy 04/21/2009.No skin abnormalities bilateral breasts. No nipple retraction bilateral breasts. No nipple discharge bilateral breasts. No lymphadenopathy. No lumps palpated bilateral breasts. No complaints of pain or tenderness on exam. Referred patient to the [facility] for a diagnostic mammogram. Appointment scheduled for Wednesday, August 14, 2019 at 1500.        Pelvic/Bimanual   Ext Genitalia No lesions, no swelling and no discharge observed on external genitalia.         Vagina Vagina pink and normal texture. No lesions or discharge observed in vagina.          Cervix Cervix is present. Cervix pink and of normal texture. No discharge observed.     Uterus Uterus is present and palpable. Uterus in normal position and normal size.        Adnexae Bilateral ovaries present and palpable. No tenderness on palpation.         Rectovaginal No rectal exam completed today since patient had no rectal complaints. No skin abnormalities observed on exam.    Smoking History: Patient has never smoked.  Patient Navigation: Patient education provided. Access to services provided for patient through [facility] program. Spanish interpreter provided.   Colorectal Cancer Screening: Per patient has never had a colonoscopy completed. FIT Test completed 01/31/2018 that was negative. No complaints today.   Breast and Cervical Cancer Risk Assessment: Patient has no family history of breast cancer. Patient has a personal history of breast cancer in 2010.  Patient has no known genetic mutations or history of radiation treatment to the chest before age 23. Patient has no history of cervical dysplasia, immunocompromised, or DES exposure in-utero.  Risk Assessment    Risk Scores      08/06/2019   Last edited by: Armond Hang, LPN   5-year risk: 1 %   Lifetime risk: 5.1 %         Used Spanish interpreter Rudene Anda from [place].

## 2019-08-06 NOTE — Patient Instructions (Signed)
Explained breast self awareness with Tahiri Zamora-Quintanilla. Let patient know [facility] will cover Pap smears and HPV typing every 5 years unless has a history of abnormal Pap smears. Referred patient to the [facility] for a diagnostic mammogram. Appointment scheduled for Wednesday, August 14, 2019 at 1500. Patient aware of appointment and will be there. Let patient know will follow up with her within the next couple weeks with results of Pap smear by letter or phone. Davette Zamora-Quintanilla verbalized understanding.  Axell Trigueros, Arvil Chaco, RN 12:08 PM

## 2019-08-06 NOTE — Addendum Note (Signed)
Encounter addended by: Loletta Parish, RN on: 08/06/2019 1:31 PM  Actions taken: Clinical Note Signed

## 2019-08-06 NOTE — Addendum Note (Signed)
Encounter addended by: Armond Hang, LPN on: [id number] PM  Actions taken: Order list changed

## 2019-08-07 ENCOUNTER — Encounter (HOSPITAL_COMMUNITY): Payer: Self-pay | Admitting: *Deleted

## 2019-08-08 LAB — CYTOLOGY - PAP
Diagnosis: NEGATIVE
High risk HPV: NEGATIVE
Molecular Disclaimer: 56
Molecular Disclaimer: DETECTED
Molecular Disclaimer: NORMAL

## 2019-08-14 ENCOUNTER — Other Ambulatory Visit: Payer: Self-pay

## 2019-08-14 ENCOUNTER — Ambulatory Visit
Admission: RE | Admit: 2019-08-14 | Discharge: 2019-08-14 | Disposition: A | Discharge status: Home / Self Care | Payer: No Typology Code available for payment source | Source: Ambulatory Visit | Attending: Obstetrics and Gynecology | Admitting: Obstetrics and Gynecology

## 2019-08-14 ENCOUNTER — Ambulatory Visit: Payer: Self-pay

## 2019-08-14 DIAGNOSIS — N644 Mastodynia: Secondary | ICD-10-CM

## 2019-08-26 ENCOUNTER — Telehealth (HOSPITAL_COMMUNITY): Payer: Self-pay | Admitting: *Deleted

## 2019-08-26 NOTE — Telephone Encounter (Signed)
Late Entry: Called patient with Spanish interpreter Benjamine Sprague 08/21/2019 and gave results of Pap smear. Let patient know that her Pap smear was normal and HPV negative. Let patient know that her next Pap smear is due in 5 years. Patient also reminded she needs to schedule her screening mammogram at the [facility]. Patient verbalized understanding.

## 2019-09-04 ENCOUNTER — Other Ambulatory Visit: Payer: Self-pay | Admitting: Family Medicine

## 2019-09-04 DIAGNOSIS — Z794 Long term (current) use of insulin: Secondary | ICD-10-CM

## 2019-09-04 DIAGNOSIS — I10 Essential (primary) hypertension: Secondary | ICD-10-CM

## 2019-09-04 DIAGNOSIS — E1140 Type 2 diabetes mellitus with diabetic neuropathy, unspecified: Secondary | ICD-10-CM

## 2019-09-04 MED FILL — glipiZIDE 10 MG TABS: 10 | 30 days supply | Qty: 60 | Fill #0

## 2019-09-04 MED FILL — CARVEDILOL 12.5 MG TABLET: 12.5 | 30 days supply | Qty: 60 | Fill #0

## 2019-09-04 MED FILL — LISINOPRIL 10 MG TABS: 10 | 30 days supply | Qty: 30 | Fill #0

## 2019-09-04 MED FILL — !LANTUS SOLOSTAR 100UNITS/M: 100 | 32 days supply | Qty: 15 | Fill #4

## 2019-09-04 MED FILL — ATORVASTATIN CALCIUM 40 MG: 40 | 30 days supply | Qty: 30 | Fill #5

## 2019-09-26 ENCOUNTER — Encounter (HOSPITAL_COMMUNITY): Payer: Self-pay

## 2019-09-26 ENCOUNTER — Other Ambulatory Visit: Payer: Self-pay

## 2019-09-26 ENCOUNTER — Ambulatory Visit (HOSPITAL_COMMUNITY)
Admission: EM | Admit: 2019-09-26 | Discharge: 2019-09-26 | Disposition: A | Discharge status: Home / Self Care | Payer: No Typology Code available for payment source | Attending: Internal Medicine | Admitting: Internal Medicine

## 2019-09-26 DIAGNOSIS — T39395A Adverse effect of other nonsteroidal anti-inflammatory drugs [NSAID], initial encounter: Secondary | ICD-10-CM

## 2019-09-26 DIAGNOSIS — G43001 Migraine without aura, not intractable, with status migrainosus: Secondary | ICD-10-CM

## 2019-09-26 DIAGNOSIS — K2960 Other gastritis without bleeding: Secondary | ICD-10-CM

## 2019-09-26 LAB — CBC
HCT: 44.4 % (ref 36.0–46.0)
Hemoglobin: 15.4 g/dL — ABNORMAL HIGH (ref 12.0–15.0)
MCH: 30 pg (ref 26.0–34.0)
MCHC: 34.7 g/dL (ref 30.0–36.0)
MCV: 86.5 fL (ref 80.0–100.0)
Platelets: 158 10*3/uL (ref 150–400)
RBC: 5.13 MIL/uL — ABNORMAL HIGH (ref 3.87–5.11)
RDW: 12.4 % (ref 11.5–15.5)
WBC: 6.7 10*3/uL (ref 4.0–10.5)
nRBC: 0 % (ref 0.0–0.2)

## 2019-09-26 LAB — BASIC METABOLIC PANEL
Anion gap: 9 (ref 5–15)
BUN: 7 mg/dL — ABNORMAL LOW (ref 8–23)
CO2: 22 mmol/L (ref 22–32)
Calcium: 9.4 mg/dL (ref 8.9–10.3)
Chloride: 109 mmol/L (ref 98–111)
Creatinine, Ser: 0.7 mg/dL (ref 0.44–1.00)
GFR calc Af Amer: >60 mL/min (ref >60)
GFR calc non Af Amer: >60 mL/min (ref >60)
Glucose, Bld: 127 mg/dL — ABNORMAL HIGH (ref 70–99)
Potassium: 4.3 mmol/L (ref 3.5–5.1)
Sodium: 140 mmol/L (ref 135–145)

## 2019-09-26 MED ORDER — SUMATRIPTAN SUCCINATE 6 MG/0.5ML ~~LOC~~ SOLN
SUBCUTANEOUS | Status: AC
  Filled 2019-09-26: qty 0.5

## 2019-09-26 MED ORDER — ACETAMINOPHEN 500 MG PO TABS: 500.0000 mg | ORAL_TABLET | Freq: Four times a day (QID) | ORAL | 0 refills | Status: DC | PRN

## 2019-09-26 MED ORDER — SUMATRIPTAN SUCCINATE 6 MG/0.5ML ~~LOC~~ SOLN
6.0000 mg | Freq: Once | SUBCUTANEOUS | Status: AC
  Administered 2019-09-26: 17:00:00 6 mg via SUBCUTANEOUS

## 2019-09-26 MED ORDER — SUMATRIPTAN SUCCINATE 25 MG PO TABS: 25.0000 mg | ORAL_TABLET | Freq: Once | ORAL | 0 refills | Status: DC

## 2019-09-26 MED ORDER — PANTOPRAZOLE SODIUM 40 MG PO TBEC: 40.0000 mg | DELAYED_RELEASE_TABLET | Freq: Every day | ORAL | 0 refills | Status: DC

## 2019-09-26 NOTE — ED Triage Notes (Signed)
Pt presents to UC w/ c/o headache since 7 days ago. Pt states she took a pill "for migraine" and ibuprofen. Pt states these helped some but still has nausea and abd pain.

## 2019-09-26 NOTE — ED Notes (Signed)
Pt states that she has not eaten very much in the past week because when she eats, her "head feels like it's growing" from the pain.

## 2019-09-26 NOTE — ED Provider Notes (Addendum)
Brandi Bryant    CSN: [id number] Arrival date & time: 09/26/19  1442      History   Chief Complaint Chief Complaint  Patient presents with  . migraine, nausea, abd pain    HPI Brandi Bryant is a 63 y.o. female with history of DM type II suboptimally controlled, hypertension -suboptimally controlled comes to the urgent care with complains of frontal headache of 7 days duration. Onset was insidious and has been persistent. Headache is of moderate severity. Headache is throbbing and of moderate severity. No known aggravating factors. She gets some relief in a quiet place. OTC analgesics(NSAIDs) has not helped with pain. She also complains of epigastric pain which is intemitent in nature. It is aggravated by food and denies any relieving factors. She has some nausea and non-bloody vomitus. No change in bowel movement or colour of stool. HPI  Past Medical History:  Diagnosis Date  . Abdominal pain   . Arthralgia 08/13/2013  . Bell palsy 2006  . Bell's palsy   . Breast cancer ([place]) 2009   Left Breast Cancer  . Chest pain 09/30/2013  . Cholelithiasis   . Colon cancer screening 10/27/2015  . Complication of anesthesia    " tubal ligation, in [place]"  had weakness, couldnt stand up the next day, was given pills for oxygen for Brain."  1 month after I was having loss of attentiviness, still have to focus  carefully.   . Coronary artery disease   . Depression   . Diabetes mellitus    Type II  . Dyspnea    at times- when air conditioner is runnimg, heat also   . Encounter for screening colonoscopy 10/30/2015  . Hepatic steatosis   . History of breast cancer 06/01/2012  . HTN (hypertension)   . Personal history of chemotherapy 2009   Left Breast Cancer  . Personal history of radiation therapy 2009   Left Breast Cancer    Patient Active Problem List   Diagnosis Date Noted  . Well woman exam with routine gynecological exam 08/06/2019  . Breast pain, right  08/06/2019  . Internal carotid artery stenosis 04/03/2018  . CVA (cerebral vascular accident) ([place]) 03/30/2018  . GERD (gastroesophageal reflux disease) 11/27/2017  . Chronic sinusitis 05/16/2017  . Diabetic polyneuropathy associated with type 2 diabetes mellitus ([place]) 10/31/2016  . Impingement syndrome of right ankle 10/31/2016  . Symptomatic cholelithiasis 12/09/2015  . Encounter for screening colonoscopy 10/30/2015  . Colon cancer screening 10/27/2015  . Bronchitis, acute 09/08/2014  . Palpitations 08/28/2014  . Preventive measure 08/21/2014  . Intractable headache 02/21/2014  . TIA (transient ischemic attack) 02/20/2014  . Trigeminal neuralgia 01/22/2014  . Weakness 01/22/2014  . Bell's palsy 01/21/2014  . UTI (urinary tract infection) 01/21/2014  . Hepatic steatosis 01/21/2014  . Headache 01/20/2014  . Diabetes type 2, uncontrolled ([place]) 01/20/2014  . Facial droop 01/15/2014  . Stroke ([place]) 01/15/2014  . Type 2 diabetes mellitus with diabetic neuropathy ([place]) 12/06/2013  . Hyperlipidemia 11/07/2013  . CAD (coronary artery disease), native coronary artery 11/07/2013  . Chest pain 09/30/2013  . Arthralgia 08/13/2013  . History of breast cancer 06/01/2012  . Abdominal pain 01/31/2012  . Nausea & vomiting 01/31/2012  . HTN (hypertension)   . Diabetes mellitus ([place])     Past Surgical History:  Procedure Laterality Date  . ANKLE ARTHROSCOPY Right 12/14/2016   Procedure: Right Ankle Arthroscopic Debridement;  Surgeon: Newt Minion, MD;  Location: [facility];  Service: Orthopedics;  Laterality:  Right;  Marland Kitchen Arm surgery     Left tendon lengthen  . BREAST LUMPECTOMY Left 2009  . CHOLECYSTECTOMY N/A 12/09/2015   Procedure: LAPAROSCOPIC CHOLECYSTECTOMY WITH INTRAOPERATIVE CHOLANGIOGRAM;  Surgeon: Greer Pickerel, MD;  Location: [facility];  Service: General;  Laterality: N/A;  . LEFT HEART CATHETERIZATION WITH CORONARY ANGIOGRAM N/A 10/30/2013   Procedure: LEFT HEART CATHETERIZATION WITH  CORONARY ANGIOGRAM;  Surgeon: Josue Hector, MD;  Location: [facility] CATH LAB;  Service: Cardiovascular;  Laterality: N/A;  . porta cath Right   . Removal of Porta cath Right   . TUBAL LIGATION      OB History    Gravida  6   Para  6   Term  6   Preterm      AB      Living  6     SAB      TAB      Ectopic      Multiple      Live Births               Home Medications    Prior to Admission medications   Medication Sig Start Date End Date Taking? Authorizing Provider  acetaminophen (TYLENOL) 500 MG tablet Take 1 tablet (500 mg total) by mouth every 6 (six) hours as needed. 09/26/19   , Myrene Galas, MD  albuterol (PROVENTIL HFA;VENTOLIN HFA) 108 (90 Base) MCG/ACT inhaler Inhale 1-2 puffs into the lungs every 6 (six) hours as needed for wheezing or shortness of breath. 04/25/17   Charlott Rakes, MD  aspirin 325 MG EC tablet Take 1 tablet (325 mg total) by mouth daily. 06/14/18   Argentina Donovan, PA-C  atorvastatin (LIPITOR) 40 MG tablet Take 1 tablet (40 mg total) by mouth daily. 01/29/19   Charlott Rakes, MD  carvedilol (COREG) 12.5 MG tablet TAKE 1 TABLET BY MOUTH 2 TIMES DAILY WITH A MEAL. 09/04/19   Charlott Rakes, MD  cetirizine (ZYRTEC) 10 MG tablet Take 1 tablet (10 mg total) by mouth daily. 05/16/17   Charlott Rakes, MD  fluticasone (FLONASE) 50 MCG/ACT nasal spray Place 2 sprays into both nostrils daily. 03/26/19   Charlott Rakes, MD  glipiZIDE (GLUCOTROL) 10 MG tablet TAKE 1 TABLET BY MOUTH 2 TIMES DAILY BEFORE A MEAL. 09/04/19   Charlott Rakes, MD  Insulin Glargine (LANTUS SOLOSTAR) 100 UNIT/ML Solostar Pen Inject 23 Units into the skin 2 (two) times daily. 01/29/19   Charlott Rakes, MD  lisinopril (ZESTRIL) 10 MG tablet TAKE 1 TABLET (10 MG TOTAL) BY MOUTH DAILY. 09/27/19   Charlott Rakes, MD  olopatadine (PATANOL) 0.1 % ophthalmic solution Place 1 drop into both eyes 2 (two) times daily. 03/26/19   Charlott Rakes, MD  pantoprazole (PROTONIX) 40 MG tablet Take  1 tablet (40 mg total) by mouth daily. 09/26/19   Myrene Galas, MD  promethazine (PHENERGAN) 25 MG tablet Take 1 tablet (25 mg total) by mouth every 8 (eight) hours as needed for nausea or vomiting. 10/30/18   Charlott Rakes, MD  SUMAtriptan (IMITREX) 25 MG tablet Take 1 tablet (25 mg total) by mouth once for 1 dose. Please take 1 tablet at outset of headache.May repeat in 2 hours if headache persists or recurs. 09/26/19 09/26/19  Chase Picket, MD  traZODone (DESYREL) 50 MG tablet Take 1 tablet (50 mg total) by mouth at bedtime as needed for sleep. 01/29/19   Charlott Rakes, MD  TRUEPLUS PEN NEEDLES 31G X 6 MM MISC USE FOR NIGHTLY INSULIN INJECTIONS  02/22/18   Charlott Rakes, MD  escitalopram (LEXAPRO) 10 MG tablet Take 10 mg by mouth daily.  01/31/12  [provider]  gabapentin (NEURONTIN) 300 MG capsule Take 2 capsules (600 mg total) by mouth 2 (two) times daily. Patient not taking: Reported on 08/06/2019 01/29/19 09/26/19  Charlott Rakes, MD    Family History Family History  Problem Relation Age of Onset  . Thyroid cancer Sister   . Hypertension Mother   . Diabetes Mother   . Migraines Daughter     Social History Social History   Tobacco Use  . Smoking status: Never Smoker  . Smokeless tobacco: Never Used  Substance Use Topics  . Alcohol use: No  . Drug use: No     Allergies   Gadolinium derivatives   Review of Systems Review of Systems  Constitutional: Negative.   HENT: Negative.   Eyes: Negative.   Respiratory: Negative.   Gastrointestinal: Positive for abdominal pain, nausea and vomiting. Negative for abdominal distention, blood in stool, constipation and diarrhea.  Genitourinary: Negative.   Musculoskeletal: Negative.   Skin: Negative for wound.  Neurological: Positive for dizziness and headaches. Negative for tremors, syncope, weakness and light-headedness.  Psychiatric/Behavioral: Negative for confusion and decreased concentration.     Physical  Exam Triage Vital Signs ED Triage Vitals  Enc Vitals Group     BP 09/26/19 1528 (!) 164/78     Pulse Rate 09/26/19 1528 80     Resp 09/26/19 1528 16     Temp 09/26/19 1528 98.9 F (37.2 C)     Temp Source 09/26/19 1528 Oral     SpO2 09/26/19 1528 97 %     Weight --      Height --      Head Circumference --      Peak Flow --      Pain Score 09/26/19 1532 7     Pain Loc --      Pain Edu? --      Excl. in [place]? --    No data found.  Updated Vital Signs BP (!) 164/78 (BP Location: Left Arm)   Pulse 80   Temp 98.9 F (37.2 C) (Oral)   Resp 16   SpO2 97%   Visual Acuity Right Eye Distance:   Left Eye Distance:   Bilateral Distance:    Right Eye Near:   Left Eye Near:    Bilateral Near:     Physical Exam Constitutional:      General: She is not in acute distress.    Appearance: She is ill-appearing. She is not toxic-appearing.  HENT:     Right Ear: Tympanic membrane normal.     Left Ear: Tympanic membrane normal.     Nose: No rhinorrhea.     Mouth/Throat:     Mouth: Mucous membranes are moist.     Pharynx: No oropharyngeal exudate or posterior oropharyngeal erythema.  Eyes:     Extraocular Movements: Extraocular movements intact.     Conjunctiva/sclera: Conjunctivae normal.  Neck:     Musculoskeletal: Normal range of motion and neck supple. No neck rigidity.  Cardiovascular:     Rate and Rhythm: Normal rate and regular rhythm.     Pulses: Normal pulses.     Heart sounds: Normal heart sounds.  Pulmonary:     Effort: Pulmonary effort is normal. No respiratory distress.     Breath sounds: Normal breath sounds. No rhonchi or rales.  Abdominal:     General: Bowel sounds  are normal.     Palpations: Abdomen is soft.  Musculoskeletal: Normal range of motion.        General: No tenderness or deformity.  Skin:    Capillary Refill: Capillary refill takes less than 2 seconds.  Neurological:     General: No focal deficit present.     Mental Status: She is alert and  oriented to person, place, and time. Mental status is at baseline.     Cranial Nerves: No cranial nerve deficit.     Sensory: No sensory deficit.  Psychiatric:        Mood and Affect: Mood normal.      UC Treatments / Results  Labs (all labs ordered are listed, but only abnormal results are displayed) Labs Reviewed  CBC - Abnormal; Notable for the following components:      Result Value   RBC 5.13 (*)    Hemoglobin 15.4 (*)    All other components within normal limits  BASIC METABOLIC PANEL - Abnormal; Notable for the following components:   Glucose, Bld 127 (*)    BUN 7 (*)    All other components within normal limits    EKG   Radiology No results found.  Procedures Procedures (including critical care time)  Medications Ordered in UC Medications  SUMAtriptan (IMITREX) injection 6 mg (6 mg Subcutaneous Given 09/26/19 1710)  SUMAtriptan (IMITREX) 6 MG/0.5ML injection (has no administration in time range)    Initial Impression / Assessment and Plan / UC Course  I have reviewed the triage vital signs and the nursing notes.  Pertinent labs & imaging results that were available during my care of the patient were reviewed by me and considered in my medical decision making (see chart for details).     1. Headache with migraine features: Trail of Imitrex  Tylenol as needed for headache If patient develops any changes in mental status, numbness tingling or lateralized weakness, she is advised to go to the ED to be evaluated.  2. NSAIDs gastritis: Protonix CBC, BMP. If no improvement over the next month, she may benefit from GI evaluation Discontinue NSAIDs use. If patient notices worsen symptoms, hematemesis, hematochezia or tarry stools she is advised to go to ED for evaluation. Final Clinical Impressions(s) / UC Diagnoses   Final diagnoses:  Migraine without aura and with status migrainosus, not intractable  NSAID induced gastritis   Discharge Instructions    None    ED Prescriptions    Medication Sig Dispense Auth. Provider   pantoprazole (PROTONIX) 40 MG tablet Take 1 tablet (40 mg total) by mouth daily. 30 tablet , Myrene Galas, MD   SUMAtriptan (IMITREX) 25 MG tablet Take 1 tablet (25 mg total) by mouth once for 1 dose. Please take 1 tablet at outset of headache.May repeat in 2 hours if headache persists or recurs. 20 tablet , Myrene Galas, MD   acetaminophen (TYLENOL) 500 MG tablet Take 1 tablet (500 mg total) by mouth every 6 (six) hours as needed. 30 tablet , Myrene Galas, MD     PDMP not reviewed this encounter.   Chase Picket, MD 09/28/19 2226    Chase Picket, MD 09/28/19 2227    Chase Picket, MD 09/28/19 2228

## 2019-09-27 ENCOUNTER — Other Ambulatory Visit: Payer: Self-pay | Admitting: Family Medicine

## 2019-09-27 DIAGNOSIS — I10 Essential (primary) hypertension: Secondary | ICD-10-CM

## 2019-09-27 MED FILL — LISINOPRIL 10 MG TABS: 10 | 30 days supply | Qty: 30 | Fill #0

## 2019-09-27 MED FILL — SUMATRIPTAN SUCC 25 MG TAB: 25 | 30 days supply | Qty: 9 | Fill #0

## 2019-09-27 MED FILL — PANTOPRAZOLE SOD DR 40 MG T: 40 | 30 days supply | Qty: 30 | Fill #0

## 2019-10-01 ENCOUNTER — Encounter: Payer: Self-pay | Admitting: Family Medicine

## 2019-10-01 ENCOUNTER — Ambulatory Visit: Payer: Self-pay | Attending: Family Medicine | Admitting: Family Medicine

## 2019-10-01 ENCOUNTER — Other Ambulatory Visit: Payer: Self-pay

## 2019-10-01 VITALS — BP 140/79 | HR 61 | Temp 98.5°F | Ht 60.0 in | Wt 163.0 lb

## 2019-10-01 DIAGNOSIS — I10 Essential (primary) hypertension: Secondary | ICD-10-CM

## 2019-10-01 DIAGNOSIS — Z794 Long term (current) use of insulin: Secondary | ICD-10-CM

## 2019-10-01 DIAGNOSIS — Z1211 Encounter for screening for malignant neoplasm of colon: Secondary | ICD-10-CM

## 2019-10-01 DIAGNOSIS — G43709 Chronic migraine without aura, not intractable, without status migrainosus: Secondary | ICD-10-CM

## 2019-10-01 DIAGNOSIS — E7800 Pure hypercholesterolemia, unspecified: Secondary | ICD-10-CM

## 2019-10-01 DIAGNOSIS — E1140 Type 2 diabetes mellitus with diabetic neuropathy, unspecified: Secondary | ICD-10-CM

## 2019-10-01 DIAGNOSIS — G4700 Insomnia, unspecified: Secondary | ICD-10-CM

## 2019-10-01 LAB — POCT GLYCOSYLATED HEMOGLOBIN (HGB A1C): HbA1c, POC (controlled diabetic range): 7.6 % — AB (ref 0.0–7.0)

## 2019-10-01 LAB — GLUCOSE, POCT (MANUAL RESULT ENTRY): POC Glucose: 169 mg/dl — AB (ref 70–99)

## 2019-10-01 MED ORDER — PANTOPRAZOLE SODIUM 40 MG PO TBEC: 40.0000 mg | DELAYED_RELEASE_TABLET | Freq: Every day | ORAL | 6 refills | Status: DC

## 2019-10-01 MED ORDER — GLIPIZIDE 10 MG PO TABS: 10.0000 mg | ORAL_TABLET | Freq: Two times a day (BID) | ORAL | 6 refills | Status: DC

## 2019-10-01 MED ORDER — LISINOPRIL 10 MG PO TABS: 10.0000 mg | ORAL_TABLET | Freq: Every day | ORAL | 6 refills | Status: DC

## 2019-10-01 MED ORDER — GABAPENTIN 300 MG PO CAPS: 600.0000 mg | ORAL_CAPSULE | Freq: Two times a day (BID) | ORAL | 6 refills | Status: DC

## 2019-10-01 MED ORDER — SUMATRIPTAN SUCCINATE 25 MG PO TABS: 25.0000 mg | ORAL_TABLET | Freq: Once | ORAL | 1 refills | Status: DC

## 2019-10-01 MED ORDER — TOPIRAMATE 50 MG PO TABS: 50.0000 mg | ORAL_TABLET | Freq: Two times a day (BID) | ORAL | 3 refills | Status: DC

## 2019-10-01 MED ORDER — ATORVASTATIN CALCIUM 40 MG PO TABS: 40.0000 mg | ORAL_TABLET | Freq: Every day | ORAL | 6 refills | Status: DC

## 2019-10-01 MED ORDER — LANTUS SOLOSTAR 100 UNIT/ML ~~LOC~~ SOPN: 23.0000 [IU] | PEN_INJECTOR | Freq: Two times a day (BID) | SUBCUTANEOUS | 6 refills | Status: DC

## 2019-10-01 MED ORDER — CARVEDILOL 12.5 MG PO TABS: 12.5000 mg | ORAL_TABLET | Freq: Two times a day (BID) | ORAL | 6 refills | Status: DC

## 2019-10-01 MED ORDER — TRAZODONE HCL 50 MG PO TABS: 50.0000 mg | ORAL_TABLET | Freq: Every evening | ORAL | 6 refills | Status: DC | PRN

## 2019-10-01 MED FILL — traZODone HCL 50 MG TABS: 50 | 30 days supply | Qty: 30 | Fill #0

## 2019-10-01 MED FILL — CARVEDILOL 12.5 MG TABLET: 12.5 | 30 days supply | Qty: 60 | Fill #0

## 2019-10-01 MED FILL — ATORVASTATIN CALCIUM 40 MG: 40 | 30 days supply | Qty: 30 | Fill #0

## 2019-10-01 MED FILL — glipiZIDE 10 MG TABS: 10 | 30 days supply | Qty: 60 | Fill #0

## 2019-10-01 MED FILL — TOPIRAMATE 50 MG TABLET: 50 | 30 days supply | Qty: 60 | Fill #0

## 2019-10-01 MED FILL — GABAPENTIN 300 MG CAPSULE: 300 | 30 days supply | Qty: 120 | Fill #0

## 2019-10-01 NOTE — Progress Notes (Signed)
Subjective:  Patient ID: Brandi Bryant, female    DOB: 1956/06/22  Age: 64 y.o. MRN: [id number]  CC: Diabetes and Insomnia   HPI Brandi Bryant is a 63 year old female with a history of GERD, hypertension, hyperlipidemia, type 2 diabetes mellitus (A1c 7.6) previous CVA, TIA in 03/2018 here for an acute visit. Her A1c is 7.6 which has trended up from 6.5 previously and she endorses weight gain, less physical activity as a result of the pandemic but she has been compliant with her medications.  She does have neuropathy in her extremities but it appears she has been out of gabapentin. She complains of migraines for which she was seen at the emergency room last week and treated with Imitrex.  Migraines are daily and occur in both temples with associated phonophobia but no photophobia.  She also notes some nausea and forgetfulness when she has these migraine episodes. She also has abdominal pain associated with meals but this is relieved with the use of her PPI. Her insomnia is controlled on trazodone. Tolerating her statin with no complaint of myalgia and she is doing well on lisinopril which she takes for hypertension.  Past Medical History:  Diagnosis Date  . Abdominal pain   . Arthralgia 08/13/2013  . Bell palsy 2006  . Bell's palsy   . Breast cancer ([place]) 2009   Left Breast Cancer  . Chest pain 09/30/2013  . Cholelithiasis   . Colon cancer screening 10/27/2015  . Complication of anesthesia    " tubal ligation, in [place]"  had weakness, couldnt stand up the next day, was given pills for oxygen for Brain."  1 month after I was having loss of attentiviness, still have to focus  carefully.   . Coronary artery disease   . Depression   . Diabetes mellitus    Type II  . Dyspnea    at times- when air conditioner is runnimg, heat also   . Encounter for screening colonoscopy 10/30/2015  . Hepatic steatosis   . History of breast cancer 06/01/2012  . HTN (hypertension)   .  Personal history of chemotherapy 2009   Left Breast Cancer  . Personal history of radiation therapy 2009   Left Breast Cancer    Past Surgical History:  Procedure Laterality Date  . ANKLE ARTHROSCOPY Right 12/14/2016   Procedure: Right Ankle Arthroscopic Debridement;  Surgeon: Newt Minion, MD;  Location: [place];  Service: Orthopedics;  Laterality: Right;  . Arm surgery     Left tendon lengthen  . BREAST LUMPECTOMY Left 2009  . CHOLECYSTECTOMY N/A 12/09/2015   Procedure: LAPAROSCOPIC CHOLECYSTECTOMY WITH INTRAOPERATIVE CHOLANGIOGRAM;  Surgeon: Greer Pickerel, MD;  Location: [place];  Service: General;  Laterality: N/A;  . LEFT HEART CATHETERIZATION WITH CORONARY ANGIOGRAM N/A 10/30/2013   Procedure: LEFT HEART CATHETERIZATION WITH CORONARY ANGIOGRAM;  Surgeon: Josue Hector, MD;  Location: [facility] CATH LAB;  Service: Cardiovascular;  Laterality: N/A;  . porta cath Right   . Removal of Porta cath Right   . TUBAL LIGATION      Family History  Problem Relation Age of Onset  . Thyroid cancer Sister   . Hypertension Mother   . Diabetes Mother   . Migraines Daughter     Allergies  Allergen Reactions  . Gadolinium Derivatives Nausea And Vomiting    Code: VOM, Desc: Pt began vomiting 45 sec after MRI contrast injection of Multihance, Onset Date: [id number]      Outpatient Medications Prior to Visit  Medication Sig  Dispense Refill  . acetaminophen (TYLENOL) 500 MG tablet Take 1 tablet (500 mg total) by mouth every 6 (six) hours as needed. 30 tablet 0  . albuterol (PROVENTIL HFA;VENTOLIN HFA) 108 (90 Base) MCG/ACT inhaler Inhale 1-2 puffs into the lungs every 6 (six) hours as needed for wheezing or shortness of breath. 54 g 3  . aspirin 325 MG EC tablet Take 1 tablet (325 mg total) by mouth daily. 30 tablet 6  . cetirizine (ZYRTEC) 10 MG tablet Take 1 tablet (10 mg total) by mouth daily. 30 tablet 1  . fluticasone (FLONASE) 50 MCG/ACT nasal spray Place 2 sprays into both nostrils daily. 16 g 6   . olopatadine (PATANOL) 0.1 % ophthalmic solution Place 1 drop into both eyes 2 (two) times daily. 5 mL 2  . promethazine (PHENERGAN) 25 MG tablet Take 1 tablet (25 mg total) by mouth every 8 (eight) hours as needed for nausea or vomiting. 20 tablet 0  . TRUEPLUS PEN NEEDLES 31G X 6 MM MISC USE FOR NIGHTLY INSULIN INJECTIONS 100 each 11  . atorvastatin (LIPITOR) 40 MG tablet Take 1 tablet (40 mg total) by mouth daily. 30 tablet 6  . carvedilol (COREG) 12.5 MG tablet TAKE 1 TABLET BY MOUTH 2 TIMES DAILY WITH A MEAL. 60 tablet 0  . glipiZIDE (GLUCOTROL) 10 MG tablet TAKE 1 TABLET BY MOUTH 2 TIMES DAILY BEFORE A MEAL. 60 tablet 0  . Insulin Glargine (LANTUS SOLOSTAR) 100 UNIT/ML Solostar Pen Inject 23 Units into the skin 2 (two) times daily. 30 mL 6  . lisinopril (ZESTRIL) 10 MG tablet TAKE 1 TABLET (10 MG TOTAL) BY MOUTH DAILY. 30 tablet 0  . pantoprazole (PROTONIX) 40 MG tablet Take 1 tablet (40 mg total) by mouth daily. 30 tablet 0  . traZODone (DESYREL) 50 MG tablet Take 1 tablet (50 mg total) by mouth at bedtime as needed for sleep. 30 tablet 6  . SUMAtriptan (IMITREX) 25 MG tablet Take 1 tablet (25 mg total) by mouth once for 1 dose. Please take 1 tablet at outset of headache.May repeat in 2 hours if headache persists or recurs. 20 tablet 0   No facility-administered medications prior to visit.      ROS Review of Systems  Constitutional: Negative for activity change, appetite change and fatigue.  HENT: Negative for congestion, sinus pressure and sore throat.   Eyes: Negative for visual disturbance.  Respiratory: Negative for cough, chest tightness, shortness of breath and wheezing.   Cardiovascular: Negative for chest pain and palpitations.  Gastrointestinal: Positive for abdominal pain. Negative for abdominal distention and constipation.  Endocrine: Negative for polydipsia.  Genitourinary: Negative for dysuria and frequency.  Musculoskeletal: Negative for arthralgias and back pain.   Skin: Negative for rash.  Neurological: Positive for headaches. Negative for tremors, light-headedness and numbness.  Hematological: Does not bruise/bleed easily.  Psychiatric/Behavioral: Negative for agitation and behavioral problems.    Objective:  BP 140/79   Pulse 61   Temp 98.5 F (36.9 C) (Oral)   Ht 5' (1.524 m)   Wt 163 lb (73.9 kg)   SpO2 95%   BMI 31.83 kg/m   BP/Weight 10/01/2019 09/26/2019 [id number]  Systolic BP [id number] [id number]  Diastolic BP 79 78 86  Wt. (Lbs) 163 - 164  BMI 31.83 - 32.03      Physical Exam Constitutional:      Appearance: She is well-developed.  Neck:     Vascular: No JVD.  Cardiovascular:     Rate and Rhythm:  Normal rate.     Heart sounds: Normal heart sounds. No murmur.  Pulmonary:     Effort: Pulmonary effort is normal.     Breath sounds: Normal breath sounds. No wheezing or rales.  Chest:     Chest wall: No tenderness.  Abdominal:     General: Bowel sounds are normal. There is no distension.     Palpations: Abdomen is soft. There is no mass.     Tenderness: There is no abdominal tenderness.  Musculoskeletal: Normal range of motion.     Right lower leg: No edema.     Left lower leg: No edema.  Neurological:     Mental Status: She is alert and oriented to person, place, and time.  Psychiatric:        Mood and Affect: Mood normal.     CMP Latest Ref Rng & Units 09/26/2019 01/31/2019 06/14/2018  Glucose 70 - 99 mg/dL 127(H) 141(H) 201(H)  BUN 8 - 23 mg/dL 7(L) 11 11  Creatinine 0.44 - 1.00 mg/dL 0.70 0.74 0.87  Sodium 135 - 145 mmol/L 140 144 141  Potassium 3.5 - 5.1 mmol/L 4.3 4.3 3.8  Chloride 98 - 111 mmol/L 109 104 104  CO2 22 - 32 mmol/L 22 22 21   Calcium 8.9 - 10.3 mg/dL 9.4 9.1 9.2  Total Protein 6.0 - 8.5 g/dL - 6.7 -  Total Bilirubin 0.0 - 1.2 mg/dL - 1.1 -  Alkaline Phos 39 - 117 IU/L - 107 -  AST 0 - 40 IU/L - 29 -  ALT 0 - 32 IU/L - 20 -    Lipid Panel     Component Value Date/Time   CHOL 98 (L) 01/31/2019  1058   TRIG 94 01/31/2019 1058   HDL 43 01/31/2019 1058   CHOLHDL 2.3 01/31/2019 1058   CHOLHDL 2.6 03/31/2018 0348   VLDL 33 03/31/2018 0348   LDLCALC 36 01/31/2019 1058    CBC    Component Value Date/Time   WBC 6.7 09/26/2019 1655   RBC 5.13 (H) 09/26/2019 1655   HGB 15.4 (H) 09/26/2019 1655   HGB 13.4 09/30/2013 1033   HCT 44.4 09/26/2019 1655   HCT 39.1 09/30/2013 1033   PLT 158 09/26/2019 1655   PLT 150 09/30/2013 1033   MCV 86.5 09/26/2019 1655   MCV 88.5 09/30/2013 1033   MCH 30.0 09/26/2019 1655   MCHC 34.7 09/26/2019 1655   RDW 12.4 09/26/2019 1655   RDW 12.8 09/30/2013 1033   LYMPHSABS 2.7 03/30/2018 1521   LYMPHSABS 2.1 09/30/2013 1033   MONOABS 0.8 03/30/2018 1521   MONOABS 0.4 09/30/2013 1033   EOSABS 0.6 03/30/2018 1521   EOSABS 0.2 09/30/2013 1033   BASOSABS 0.0 03/30/2018 1521   BASOSABS 0.0 09/30/2013 1033    Lab Results  Component Value Date   HGBA1C 7.6 (A) 10/01/2019    Assessment & Plan:   1. Type 2 diabetes mellitus with diabetic neuropathy, with long-term current use of insulin (HCC) A1c of 7.6 which is slightly above goal of less than 7.0 but this has trended up from 6.5 Will not change regimen at this time but she has been counseled to work on weight loss, lifestyle modifications - Glucose (CBG) - HgB A1c - Insulin Glargine (LANTUS SOLOSTAR) 100 UNIT/ML Solostar Pen; Inject 23 Units into the skin 2 (two) times daily.  Dispense: 30 mL; Refill: 6 - glipiZIDE (GLUCOTROL) 10 MG tablet; Take 1 tablet (10 mg total) by mouth 2 (two) times daily before a  meal.  Dispense: 60 tablet; Refill: 6 - gabapentin (NEURONTIN) 300 MG capsule; Take 2 capsules (600 mg total) by mouth 2 (two) times daily.  Dispense: 120 capsule; Refill: 6  2. Insomnia, unspecified type Stable - traZODone (DESYREL) 50 MG tablet; Take 1 tablet (50 mg total) by mouth at bedtime as needed for sleep.  Dispense: 30 tablet; Refill: 6  3. Essential hypertension Controlled  Counseled on blood pressure goal of less than 130/80, low-sodium, DASH diet, medication compliance, 150 minutes of moderate intensity exercise per week. Discussed medication compliance, adverse effects. - lisinopril (ZESTRIL) 10 MG tablet; Take 1 tablet (10 mg total) by mouth daily.  Dispense: 30 tablet; Refill: 6 - carvedilol (COREG) 12.5 MG tablet; Take 1 tablet (12.5 mg total) by mouth 2 (two) times daily with a meal.  Dispense: 60 tablet; Refill: 6  4. Pure hypercholesterolemia Controlled Low-cholesterol diet - atorvastatin (LIPITOR) 40 MG tablet; Take 1 tablet (40 mg total) by mouth daily.  Dispense: 30 tablet; Refill: 6  5. Screening for colon cancer - Ambulatory referral to Gastroenterology  6. Chronic migraine without aura without status migrainosus, not intractable Uncontrolled Commenced on Topamax   Health Care Maintenance: Flu shot today Meds ordered this encounter  Medications  . topiramate (TOPAMAX) 50 MG tablet    Sig: Take 1 tablet (50 mg total) by mouth 2 (two) times daily.    Dispense:  60 tablet    Refill:  3  . traZODone (DESYREL) 50 MG tablet    Sig: Take 1 tablet (50 mg total) by mouth at bedtime as needed for sleep.    Dispense:  30 tablet    Refill:  6  . SUMAtriptan (IMITREX) 25 MG tablet    Sig: Take 1 tablet (25 mg total) by mouth once for 1 dose. Please take 1 tablet at outset of headache.May repeat in 2 hours if headache persists or recurs.    Dispense:  20 tablet    Refill:  1  . lisinopril (ZESTRIL) 10 MG tablet    Sig: Take 1 tablet (10 mg total) by mouth daily.    Dispense:  30 tablet    Refill:  6  . pantoprazole (PROTONIX) 40 MG tablet    Sig: Take 1 tablet (40 mg total) by mouth daily.    Dispense:  30 tablet    Refill:  6  . Insulin Glargine (LANTUS SOLOSTAR) 100 UNIT/ML Solostar Pen    Sig: Inject 23 Units into the skin 2 (two) times daily.    Dispense:  30 mL    Refill:  6  . glipiZIDE (GLUCOTROL) 10 MG tablet    Sig: Take 1 tablet  (10 mg total) by mouth 2 (two) times daily before a meal.    Dispense:  60 tablet    Refill:  6  . carvedilol (COREG) 12.5 MG tablet    Sig: Take 1 tablet (12.5 mg total) by mouth 2 (two) times daily with a meal.    Dispense:  60 tablet    Refill:  6  . atorvastatin (LIPITOR) 40 MG tablet    Sig: Take 1 tablet (40 mg total) by mouth daily.    Dispense:  30 tablet    Refill:  6  . gabapentin (NEURONTIN) 300 MG capsule    Sig: Take 2 capsules (600 mg total) by mouth 2 (two) times daily.    Dispense:  120 capsule    Refill:  6    Discontinue previous dose    Follow-up:  No follow-ups on file.       Charlott Rakes, MD, FAAFP. [facility] and [facility], [place]   10/01/2019, 4:12 PM

## 2019-10-01 NOTE — Patient Instructions (Signed)

## 2019-10-02 ENCOUNTER — Encounter: Payer: Self-pay | Admitting: Internal Medicine

## 2019-10-03 ENCOUNTER — Ambulatory Visit: Payer: No Typology Code available for payment source | Attending: Family Medicine

## 2019-10-03 ENCOUNTER — Other Ambulatory Visit: Payer: Self-pay

## 2019-10-08 ENCOUNTER — Ambulatory Visit (AMBULATORY_SURGERY_CENTER): Payer: Self-pay | Admitting: *Deleted

## 2019-10-08 ENCOUNTER — Other Ambulatory Visit: Payer: Self-pay

## 2019-10-08 ENCOUNTER — Telehealth: Payer: Self-pay | Admitting: *Deleted

## 2019-10-08 VITALS — Temp 96.0°F | Ht 60.0 in | Wt 164.0 lb

## 2019-10-08 DIAGNOSIS — Z1159 Encounter for screening for other viral diseases: Secondary | ICD-10-CM

## 2019-10-08 DIAGNOSIS — Z1211 Encounter for screening for malignant neoplasm of colon: Secondary | ICD-10-CM

## 2019-10-08 DIAGNOSIS — R1033 Periumbilical pain: Secondary | ICD-10-CM

## 2019-10-08 NOTE — Telephone Encounter (Signed)
As we discussed on phone  EGD dx periumbilical pain - along with screening colonoscopy

## 2019-10-08 NOTE — Telephone Encounter (Signed)
noted 

## 2019-10-08 NOTE — Progress Notes (Signed)
Interpreter used today at the [facility] for this pt.  Interpreter's name is- Eduoardo (temperature 96.8). Daughter is with pt- Brandi Bryant  Temperature 97.1  Both interpreter and daughter help with translation.  See TE from 10-08-19.  EGD added to procedure per DO per periumbilical pain.  Pt is aware that care partner will wait in the car during procedure; if they feel like they will be too hot or cold to wait in the car; they may wait in the 4 th floor lobby. Patient is aware to bring only one care partner. We want them to wear a mask (we do not have any that we can provide them), practice social distancing, and we will check their temperatures when they get here.  I did remind the patient that their care partner needs to stay in the parking lot the entire time and have a cell phone available, we will call them when the pt is ready for discharge. Patient will wear mask into building.   No egg or soy allergy  Pt states she had trouble with anesthesia 20 years ago; noted she had Propofol with ankle surgery.  She states she had no issues with that sedation  No diet medications taken  Suprep sample given  No home oxygen used or hx of sleep apnea

## 2019-10-08 NOTE — Telephone Encounter (Signed)
Dr. Carlean Purl,  This pt is here for her PV for a screening colonoscopy on 10-25-19.  She has been having severe abdominal pain "in navel area" when she has been eating for the past two weeks.  She thought that is what her visit with me was about today.  How would you like me to proceed?   Thanks, [employer]

## 2019-10-22 ENCOUNTER — Other Ambulatory Visit: Payer: Self-pay | Admitting: Internal Medicine

## 2019-10-22 ENCOUNTER — Ambulatory Visit (INDEPENDENT_AMBULATORY_CARE_PROVIDER_SITE_OTHER): Payer: Self-pay

## 2019-10-22 DIAGNOSIS — Z1159 Encounter for screening for other viral diseases: Secondary | ICD-10-CM

## 2019-10-23 LAB — SARS CORONAVIRUS 2 (TAT 6-24 HRS): SARS Coronavirus 2: NEGATIVE

## 2019-10-25 ENCOUNTER — Encounter: Payer: Self-pay | Admitting: Internal Medicine

## 2019-10-25 ENCOUNTER — Other Ambulatory Visit: Payer: Self-pay

## 2019-10-25 ENCOUNTER — Ambulatory Visit (AMBULATORY_SURGERY_CENTER): Payer: Self-pay | Admitting: Internal Medicine

## 2019-10-25 VITALS — BP 119/64 | HR 56 | Temp 98.2°F | Resp 22 | Ht 60.0 in | Wt 164.0 lb

## 2019-10-25 DIAGNOSIS — R1033 Periumbilical pain: Secondary | ICD-10-CM

## 2019-10-25 DIAGNOSIS — D128 Benign neoplasm of rectum: Secondary | ICD-10-CM

## 2019-10-25 DIAGNOSIS — Z1211 Encounter for screening for malignant neoplasm of colon: Secondary | ICD-10-CM

## 2019-10-25 MED ORDER — SODIUM CHLORIDE 0.9 % IV SOLN: 500.0000 mL | INTRAVENOUS | Status: DC

## 2019-10-25 NOTE — Progress Notes (Signed)
Upon discharge patient became unresponsive. Applied jaw lift and patient opened eyes. CRNA at beside. Obtain vitals WNL. Applied jaw lift each time patient went back to sleep. After multiple times patient finally woke up and started talking. Family member at bedside. Patient ambulated to wheel chair. Color pink. Instructed to call back if symptoms persist.

## 2019-10-25 NOTE — Op Note (Signed)
[place] Patient Name: Brandi Bryant Procedure Date: 10/25/2019 9:08 AM MRN: [id number] Endoscopist: Gatha Mayer , MD Age: 63 Referring MD:  Date of Birth: [birth year] Gender: Female Account #: [id number] Procedure:                Colonoscopy Indications:              Screening for colorectal malignant neoplasm Medicines:                Propofol per Anesthesia, Monitored Anesthesia Care Procedure:                Pre-Anesthesia Assessment:                           - Prior to the procedure, a History and Physical                            was performed, and patient medications and                            allergies were reviewed. The patient's tolerance of                            previous anesthesia was also reviewed. The risks                            and benefits of the procedure and the sedation                            options and risks were discussed with the patient.                            All questions were answered, and informed consent                            was obtained. Prior Anticoagulants: The patient has                            taken no previous anticoagulant or antiplatelet                            agents. ASA Grade Assessment: II - A patient with                            mild systemic disease. After reviewing the risks                            and benefits, the patient was deemed in                            satisfactory condition to undergo the procedure.                           After obtaining informed consent, the colonoscope  was passed under direct vision. Throughout the                            procedure, the patient's blood pressure, pulse, and                            oxygen saturations were monitored continuously. The                            Colonoscope was introduced through the anus and                            advanced to the the cecum, identified by          appendiceal orifice and ileocecal valve. The                            colonoscopy was performed without difficulty. The                            patient tolerated the procedure well. The quality                            of the bowel preparation was excellent. The                            ileocecal valve, appendiceal orifice, and rectum                            were photographed. The bowel preparation used was                            Miralax via split dose instruction. Scope In: 9:34:13 AM Scope Out: 9:44:41 AM Scope Withdrawal Time: 0 hours 8 minutes 37 seconds  Total Procedure Duration: 0 hours 10 minutes 28 seconds  Findings:                 The perianal and digital rectal examinations were                            normal.                           A diminutive polyp was found in the rectum. The                            polyp was sessile. The polyp was removed with a                            cold snare. Resection and retrieval were complete.                            Verification of patient identification for the  specimen was done. Estimated blood loss was minimal.                           Multiple diverticula were found in the left colon.                           The exam was otherwise without abnormality on                            direct and retroflexion views. Complications:            No immediate complications. Estimated Blood Loss:     Estimated blood loss was minimal. Impression:               - One diminutive polyp in the rectum, removed with                            a cold snare. Resected and retrieved.                           - Diverticulosis in the left colon.                           - The examination was otherwise normal on direct                            and retroflexion views. Recommendation:           - Patient has a contact number available for                            emergencies. The signs and  symptoms of potential                            delayed complications were discussed with the                            patient. Return to normal activities tomorrow.                            Written discharge instructions were provided to the                            patient.                           - Resume previous diet.                           - Continue present medications.                           - Repeat colonoscopy is recommended. The                            colonoscopy date will be determined after pathology  results from today's exam become available for                            review. Gatha Mayer, MD 10/25/2019 9:59:41 AM This report has been signed electronically.

## 2019-10-25 NOTE — Progress Notes (Signed)
Report given to PACU, vss 

## 2019-10-25 NOTE — Progress Notes (Signed)
Temp JB  vs [place] I have reviewed the patient's medical history in detail and updated the computerized patient record.

## 2019-10-25 NOTE — Op Note (Signed)
[place] Patient Name: Brandi Bryant Procedure Date: 10/25/2019 9:09 AM MRN: [id number] Endoscopist: Gatha Mayer , MD Age: 63 Referring MD:  Date of Birth: 19-Aug-1956 Gender: Female Account #: [id number] Procedure:                Upper GI endoscopy Indications:              Periumbilical abdominal pain Medicines:                Propofol per Anesthesia, Monitored Anesthesia Care Procedure:                After obtaining informed consent, the endoscope was                            passed under direct vision. Throughout the                            procedure, the patient's blood pressure, pulse, and                            oxygen saturations were monitored continuously. The                            Endoscope was introduced through the mouth, and                            advanced to the second part of duodenum. The upper                            GI endoscopy was accomplished without difficulty.                            The patient tolerated the procedure well. Scope In: Scope Out: Findings:                 The esophagus was normal.                           The stomach was normal.                           The examined duodenum was normal. Complications:            No immediate complications. Estimated Blood Loss:     Estimated blood loss: none. Impression:               - Normal esophagus.                           - Normal stomach.                           - Normal examined duodenum.                           - No specimens collected. Gatha Mayer, MD 10/25/2019 9:57:31 AM This report has been signed electronically.

## 2019-10-25 NOTE — Progress Notes (Signed)
Called to room to assist during endoscopic procedure.  Patient ID and intended procedure confirmed with present staff. Received instructions for my participation in the procedure from the performing physician.  

## 2019-10-25 NOTE — Patient Instructions (Addendum)
Endoscopia - normal esofago, estomago, intestino superior  Colonoscopia - 1 polipo de colon,  diverticulose  El polipo sera examinado y le hare saber cual fue  No es cancer  Puede hacer una cita para verme si su estomago sigue molestandole  Agradezco la opprtunidai de cuidar de ti Gatha Mayer, MD, [facility]  Discharge instructions given. Handout on polyps and diverticulosiss. Resume previous medications. YOU HAD AN ENDOSCOPIC PROCEDURE TODAY AT [facility]:   Refer to the procedure report that was given to you for any specific questions about what was found during the examination.  If the procedure report does not answer your questions, please call your gastroenterologist to clarify.  If you requested that your care partner not be given the details of your procedure findings, then the procedure report has been included in a sealed envelope for you to review at your convenience later.  YOU SHOULD EXPECT: Some feelings of bloating in the abdomen. Passage of more gas than usual.  Walking can help get rid of the air that was put into your GI tract during the procedure and reduce the bloating. If you had a lower endoscopy (such as a colonoscopy or flexible sigmoidoscopy) you may notice spotting of blood in your stool or on the toilet paper. If you underwent a bowel prep for your procedure, you may not have a normal bowel movement for a few days.  Please Note:  You might notice some irritation and congestion in your nose or some drainage.  This is from the oxygen used during your procedure.  There is no need for concern and it should clear up in a day or so.  SYMPTOMS TO REPORT IMMEDIATELY:   Following lower endoscopy (colonoscopy or flexible sigmoidoscopy):  Excessive amounts of blood in the stool  Significant tenderness or worsening of abdominal pains  Swelling of the abdomen that is new, acute  Fever of 100F or higher   Following upper endoscopy (EGD)  Vomiting of blood  or coffee ground material  New chest pain or pain under the shoulder blades  Painful or persistently difficult swallowing  New shortness of breath  Fever of 100F or higher  Black, tarry-looking stools  For urgent or emergent issues, a gastroenterologist can be reached at any hour by calling [phone].   DIET:  We do recommend a small meal at first, but then you may proceed to your regular diet.  Drink plenty of fluids but you should avoid alcoholic beverages for 24 hours.  ACTIVITY:  You should plan to take it easy for the rest of today and you should NOT DRIVE or use heavy machinery until tomorrow (because of the sedation medicines used during the test).    FOLLOW UP: Our staff will call the number listed on your records 48-72 hours following your procedure to check on you and address any questions or concerns that you may have regarding the information given to you following your procedure. If we do not reach you, we will leave a message.  We will attempt to reach you two times.  During this call, we will ask if you have developed any symptoms of COVID 19. If you develop any symptoms (ie: fever, flu-like symptoms, shortness of breath, cough etc.) before then, please call [phone].  If you test positive for Covid 19 in the 2 weeks post procedure, please call and report this information to [place].    If any biopsies were taken you will be contacted by phone or  by letter within the next 1-3 weeks.  Please call us at [phone] if you have not heard about the biopsies in 3 weeks.    SIGNATURES/CONFIDENTIALITY: You and/or your care partner have signed paperwork which will be entered into your electronic medical record.  These signatures attest to the fact that that the information above on your After Visit Summary has been reviewed and is understood.  Full responsibility of the confidentiality of this discharge information lies with you and/or your care-partner.

## 2019-10-29 ENCOUNTER — Encounter: Payer: Self-pay | Admitting: Nurse Practitioner

## 2019-10-29 ENCOUNTER — Telehealth: Payer: Self-pay | Admitting: *Deleted

## 2019-10-29 ENCOUNTER — Other Ambulatory Visit (INDEPENDENT_AMBULATORY_CARE_PROVIDER_SITE_OTHER): Payer: Self-pay

## 2019-10-29 ENCOUNTER — Ambulatory Visit (INDEPENDENT_AMBULATORY_CARE_PROVIDER_SITE_OTHER): Payer: Self-pay | Admitting: Nurse Practitioner

## 2019-10-29 VITALS — BP 152/74 | HR 66 | Temp 97.4°F | Ht 59.5 in | Wt 162.0 lb

## 2019-10-29 DIAGNOSIS — R1032 Left lower quadrant pain: Secondary | ICD-10-CM

## 2019-10-29 DIAGNOSIS — R1013 Epigastric pain: Secondary | ICD-10-CM

## 2019-10-29 DIAGNOSIS — R1033 Periumbilical pain: Secondary | ICD-10-CM

## 2019-10-29 LAB — COMPREHENSIVE METABOLIC PANEL
ALT: 25 U/L (ref 0–35)
AST: 24 U/L (ref 0–37)
Albumin: 4 g/dL (ref 3.5–5.2)
Alkaline Phosphatase: 88 U/L (ref 39–117)
BUN: 16 mg/dL (ref 6–23)
CO2: 27 mEq/L (ref 19–32)
Calcium: 9.3 mg/dL (ref 8.4–10.5)
Chloride: 106 mEq/L (ref 96–112)
Creatinine, Ser: 0.7 mg/dL (ref 0.40–1.20)
GFR: 84.64 mL/min (ref >60.00)
Glucose, Bld: 205 mg/dL — ABNORMAL HIGH (ref 70–99)
Potassium: 4.4 mEq/L (ref 3.5–5.1)
Sodium: 139 mEq/L (ref 135–145)
Total Bilirubin: 1.2 mg/dL (ref 0.2–1.2)
Total Protein: 7 g/dL (ref 6.0–8.3)

## 2019-10-29 LAB — CBC WITH DIFFERENTIAL/PLATELET
Basophils Absolute: 0 10*3/uL (ref 0.0–0.1)
Basophils Relative: 0.5 % (ref 0.0–3.0)
Eosinophils Absolute: 0.3 10*3/uL (ref 0.0–0.7)
Eosinophils Relative: 4.7 % (ref 0.0–5.0)
HCT: 42.4 % (ref 36.0–46.0)
Hemoglobin: 14.3 g/dL (ref 12.0–15.0)
Lymphocytes Relative: 35.8 % (ref 12.0–46.0)
Lymphs Abs: 2 10*3/uL (ref 0.7–4.0)
MCHC: 33.7 g/dL (ref 30.0–36.0)
MCV: 89.7 fl (ref 78.0–100.0)
Monocytes Absolute: 0.6 10*3/uL (ref 0.1–1.0)
Monocytes Relative: 10 % (ref 3.0–12.0)
Neutro Abs: 2.8 10*3/uL (ref 1.4–7.7)
Neutrophils Relative %: 49 % (ref 43.0–77.0)
Platelets: 149 10*3/uL — ABNORMAL LOW (ref 150.0–400.0)
RBC: 4.73 Mil/uL (ref 3.87–5.11)
RDW: 13.4 % (ref 11.5–15.5)
WBC: 5.7 10*3/uL (ref 4.0–10.5)

## 2019-10-29 LAB — C-REACTIVE PROTEIN: CRP: <1 mg/dL (ref 0.5–20.0)

## 2019-10-29 NOTE — Progress Notes (Signed)
10/29/2019 Juliya Ando [id number] 09-06-1956   History of Present Illness: Brandi Bryant is a 63 year old female with a past medical history of hypertension, CAD, pericarditis 2019, CHF-D, TIA, DM II,peripheral neuropathy, breast cancer s/p  left breast lumpectomy with radiation and chemo 2009, migraine headaches and GERD. S/P cholecystectomy secondary to gallstones 2017. She is spanish speaking, an interpretor Eddie is present during this office visit. She underwent an EGD due to having epigastric pain and a screening colonoscopy by Dr. Carlean Purl on 10/25/2019. She presents today to review her procedure results. She is also complaining of having constipation and lower abdominal pain since having these procedures. The EGD was normal, findings did not explain her epigastric pain. The colonoscopy identified one tubular adenomatous polyp to the rectum and left colon diverticulosis. Her last BM was on 12/5, she passed a normal soft brown BM at that time. No rectal bleeding or melena. No fever. She continues to have intermittent epigastric pain that is noticeably worse if she eats greasy foods or meat. She drank camomile tea which reduced her epigastric pain. She has on and off nausea. No vomiting. She takes ASA 325mg  daily. She denies having any heartburn or dysphagia. She is on Pantoprazole 40mg  daily.   EGD 10/25/2019: - Normal esophagus. - Normal stomach. - Normal examined duodenum  Colonoscopy 10/25/2019 - One diminutive tubular adenomatous polyp in the rectum, removed with a cold snare.  - Diverticulosis in the left colon. - The examination was otherwise normal on direct and retroflexion views.  Past Medical History:  Diagnosis Date   Abdominal pain    Allergy    Arthralgia 08/13/2013   Bell palsy 2006   Bell's palsy    Breast cancer ([place]) 2009   Left Breast Cancer   Cataract    Chest pain 09/30/2013   Cholelithiasis    Colon cancer screening [id number]    Complication of anesthesia    " tubal ligation, in [place]"  had weakness, couldnt stand up the next day, was given pills for oxygen for Brain."  1 month after I was having loss of attentiviness, still have to focus  carefully.    Coronary artery disease    Depression    Diabetes mellitus    Type II   Dyspnea    at times- when air conditioner is runnimg, heat also    Encounter for screening colonoscopy 10/30/2015   GERD (gastroesophageal reflux disease)    Hepatic steatosis    History of breast cancer 06/01/2012   HTN (hypertension)    Hyperlipidemia    Personal history of chemotherapy 2009   Left Breast Cancer   Personal history of radiation therapy 2009   Left Breast Cancer   Stroke [facility])    2006, 2015   Past Surgical History:  Procedure Laterality Date   ANKLE ARTHROSCOPY Right 12/14/2016   Procedure: Right Ankle Arthroscopic Debridement;  Surgeon: Newt Minion, MD;  Location: [place];  Service: Orthopedics;  Laterality: Right;   Arm surgery     Left tendon lengthen   BREAST LUMPECTOMY Left 2009   CHOLECYSTECTOMY N/A 12/09/2015   Procedure: LAPAROSCOPIC CHOLECYSTECTOMY WITH INTRAOPERATIVE CHOLANGIOGRAM;  Surgeon: Greer Pickerel, MD;  Location: [place];  Service: General;  Laterality: N/A;   LEFT HEART CATHETERIZATION WITH CORONARY ANGIOGRAM N/A 10/30/2013   Procedure: LEFT HEART CATHETERIZATION WITH CORONARY ANGIOGRAM;  Surgeon: Josue Hector, MD;  Location: [facility] CATH LAB;  Service: Cardiovascular;  Laterality: N/A;   porta cath Right  Removal of Porta cath Right    TUBAL LIGATION      Current Outpatient Medications on File Prior to Visit  Medication Sig Dispense Refill   acetaminophen (TYLENOL) 500 MG tablet Take 1 tablet (500 mg total) by mouth every 6 (six) hours as needed. 30 tablet 0   albuterol (PROVENTIL HFA;VENTOLIN HFA) 108 (90 Base) MCG/ACT inhaler Inhale 1-2 puffs into the lungs every 6 (six) hours as needed for wheezing or shortness of breath.  54 g 3   aspirin 325 MG EC tablet Take 1 tablet (325 mg total) by mouth daily. 30 tablet 6   atorvastatin (LIPITOR) 40 MG tablet Take 1 tablet (40 mg total) by mouth daily. 30 tablet 6   carvedilol (COREG) 12.5 MG tablet Take 1 tablet (12.5 mg total) by mouth 2 (two) times daily with a meal. 60 tablet 6   glipiZIDE (GLUCOTROL) 10 MG tablet Take 1 tablet (10 mg total) by mouth 2 (two) times daily before a meal. 60 tablet 6   Insulin Glargine (LANTUS SOLOSTAR) 100 UNIT/ML Solostar Pen Inject 23 Units into the skin 2 (two) times daily. 30 mL 6   lisinopril (ZESTRIL) 10 MG tablet Take 1 tablet (10 mg total) by mouth daily. 30 tablet 6   olopatadine (PATANOL) 0.1 % ophthalmic solution Place 1 drop into both eyes 2 (two) times daily. 5 mL 2   pantoprazole (PROTONIX) 40 MG tablet Take 1 tablet (40 mg total) by mouth daily. 30 tablet 6   promethazine (PHENERGAN) 25 MG tablet Take 1 tablet (25 mg total) by mouth every 8 (eight) hours as needed for nausea or vomiting. 20 tablet 0   SUMAtriptan (IMITREX) 25 MG tablet Take 1 tablet (25 mg total) by mouth once for 1 dose. Please take 1 tablet at outset of headache.May repeat in 2 hours if headache persists or recurs. 20 tablet 1   topiramate (TOPAMAX) 50 MG tablet Take 1 tablet (50 mg total) by mouth 2 (two) times daily. 60 tablet 3   traZODone (DESYREL) 50 MG tablet Take 1 tablet (50 mg total) by mouth at bedtime as needed for sleep. 30 tablet 6   TRUEPLUS PEN NEEDLES 31G X 6 MM MISC USE FOR NIGHTLY INSULIN INJECTIONS 100 each 11   [DISCONTINUED] escitalopram (LEXAPRO) 10 MG tablet Take 10 mg by mouth daily.     Current Facility-Administered Medications on File Prior to Visit  Medication Dose Route Frequency Provider Last Rate Last Dose   0.9 %  sodium chloride infusion  500 mL Intravenous Continuous Gatha Mayer, MD       Allergies  Allergen Reactions   Gadolinium Derivatives Nausea And Vomiting    Code: VOM, Desc: Pt began vomiting 45  sec after MRI contrast injection of Multihance, Onset Date: [id number]     Iodinated Diagnostic Agents     Current Medications, Allergies, Past Medical History, Past Surgical History, Family History and Social History were reviewed in [employer] record.   Physical Exam: Temp (!) 97.4 F (36.3 C)    Wt 162 lb (73.5 kg)    BMI 31.64 kg/m  General: Well developed  64 year old female in no acute distress Head: Normocephalic and atraumatic Eyes:  Sclerae anicteric, conjunctiva pink  Ears: Normal auditory acuity Lungs: Clear throughout to auscultation Heart: Regular rate and rhythm, no murmur  Abdomen: Soft, moderate central lower and LLQ tenderness without rebound or guarding. No masses, no hepatomegaly. Normal bowel sounds x 4 quads.  Rectal: Deferred.  Musculoskeletal: Symmetrical with no gross deformities  Extremities: No edema  Neurological: Alert oriented x 4, grossly nonfocal Psychological:  Alert and cooperative. Normal mood and affect  Assessment and Recommendations:  68. 63 year old female with epigastric pain, improving. Normal EGD. S/P cholecystectomy 2017. -avoid fatty foods  2. Constipation -Miralax Q hs as needed  3. Central lower and LLQ pain, left colon diverticulosis -abdominal/pelvic CT with oral contrast only (pt is allergic to IV contrast) rule out diverticulitis -CBC, CMP and CRP  4. Tubular adenomatous rectal polyp -repeat colonoscopy in 5 years discussed with patient, however, I explained to the patient Dr. Carlean Purl has not yet reviewed her colonoscopy biopsy results and he will contact her to verify her colonoscopy recall date.  5. Hx of CAD, pericarditis and CHF-D. Cardiac cath in Dec 2014 that showed mod non-obstructive CAD in the LAD/D1, D2, normal LVEF 60-65%. Followed by cardiologist Dr. Ena Dawley   Further follow up be determined after CTAP and lab results received

## 2019-10-29 NOTE — Telephone Encounter (Signed)
  Follow up Call-  Call back number 10/25/2019  Post procedure Call Back phone  # [phone]  Permission to leave phone message Yes  Some recent data might be hidden     Patient questions:  Do you have a fever, pain , or abdominal swelling? No. Pain Score  0 *  Have you tolerated food without any problems? Yes.    Have you been able to return to your normal activities? Yes.    Do you have any questions about your discharge instructions: Diet   No. Medications  No. Follow up visit  No.  Do you have questions or concerns about your Care? No.  Actions: * If pain score is 4 or above: No action needed, pain <4.  1. Have you developed a fever since your procedure? no  2.   Have you had an respiratory symptoms (SOB or cough) since your procedure? no  3.   Have you tested positive for COVID 19 since your procedure no  4.   Have you had any family members/close contacts diagnosed with the COVID 19 since your procedure?  no   If yes to any of these questions please route to Joylene John, RN and Alphonsa Gin, Therapist, sports.

## 2019-10-29 NOTE — Patient Instructions (Addendum)
If you are age 62 or older, your body mass index should be between 23-30. Your Body mass index is 32.17 kg/m. If this is out of the aforementioned range listed, please consider follow up with your Primary Care Provider.  If you are age 66 or younger, your body mass index should be between 19-25. Your Body mass index is 32.17 kg/m. If this is out of the aformentioned range listed, please consider follow up with your Primary Care Provider.   Your provider has requested that you go to the basement level for lab work before leaving today. Press "B" on the elevator. The lab is located at the first door on the left as you exit the elevator.   Start Miralax 1 capful mixed in 8 ounces of water at bed time as needed, ok to take every night if needed    You have been scheduled for a CT scan of the abdomen and pelvis at [place] ([address]---this is in the same building as [employer]).   You are scheduled on Thursday October 31, 2019 at 9 am. You should arrive 15 minutes prior to your appointment time for registration. Please follow the written instructions below on the day of your exam:  WARNING: IF YOU ARE ALLERGIC TO IODINE/X-RAY DYE, PLEASE NOTIFY RADIOLOGY IMMEDIATELY AT [id number]! YOU WILL BE GIVEN A 13 HOUR PREMEDICATION PREP.  1) Do not eat or drink anything after 5 am (4 hours prior to your test) 2) You have been given 2 bottles of oral contrast to drink. The solution may taste better if refrigerated, but do NOT add ice or any other liquid to this solution. Shake well before drinking.    Drink 1 bottle of contrast @ 7 am (2 hours prior to your exam)  Drink 1 bottle of contrast @ 8 am (1 hour prior to your exam)  You may take any medications as prescribed with a small amount of water, if necessary. If you take any of the following medications: METFORMIN, GLUCOPHAGE, GLUCOVANCE, AVANDAMET, RIOMET, FORTAMET, [place] MET, JANUMET, GLUMETZA or METAGLIP, you MAY  be asked to HOLD this medication 48 hours AFTER the exam.  The purpose of you drinking the oral contrast is to aid in the visualization of your intestinal tract. The contrast solution may cause some diarrhea. Depending on your individual set of symptoms, you may also receive an intravenous injection of x-ray contrast/dye. Plan on being at [facility] for 30 minutes or longer, depending on the type of exam you are having performed.  This test typically takes 30-45 minutes to complete.  If you have any questions regarding your exam or if you need to reschedule, you may call the CT department at [phone] between the hours of 8:00 am and 5:00 pm, Monday-Friday.  ________________________________________________________________  Call in symptoms do not improve or get worse.

## 2019-10-31 ENCOUNTER — Ambulatory Visit (INDEPENDENT_AMBULATORY_CARE_PROVIDER_SITE_OTHER)
Admission: RE | Admit: 2019-10-31 | Discharge: 2019-10-31 | Disposition: A | Discharge status: Home / Self Care | Payer: Self-pay | Source: Ambulatory Visit | Attending: Nurse Practitioner | Admitting: Nurse Practitioner

## 2019-10-31 ENCOUNTER — Other Ambulatory Visit: Payer: Self-pay

## 2019-10-31 DIAGNOSIS — R1032 Left lower quadrant pain: Secondary | ICD-10-CM

## 2019-10-31 DIAGNOSIS — R1033 Periumbilical pain: Secondary | ICD-10-CM

## 2019-10-31 DIAGNOSIS — R1013 Epigastric pain: Secondary | ICD-10-CM

## 2019-11-04 MED FILL — !LANTUS SOLOSTAR 100UNITS/M: 100 | 32 days supply | Qty: 15 | Fill #5

## 2019-11-04 MED FILL — glipiZIDE 10 MG TABS: 10 | 30 days supply | Qty: 60 | Fill #1

## 2019-11-04 MED FILL — LISINOPRIL 10 MG TABS: 10 | 30 days supply | Qty: 30 | Fill #0

## 2019-11-07 ENCOUNTER — Encounter: Payer: Self-pay | Admitting: Internal Medicine

## 2019-11-07 DIAGNOSIS — Z86010 Personal history of colonic polyps: Secondary | ICD-10-CM

## 2019-11-07 DIAGNOSIS — Z860101 Personal history of adenomatous and serrated colon polyps: Secondary | ICD-10-CM

## 2019-11-07 HISTORY — DX: Personal history of colonic polyps: Z86.010

## 2019-11-07 HISTORY — DX: Personal history of adenomatous and serrated colon polyps: Z86.0101

## 2019-11-12 ENCOUNTER — Encounter: Payer: Self-pay | Admitting: Internal Medicine

## 2019-11-12 ENCOUNTER — Ambulatory Visit (INDEPENDENT_AMBULATORY_CARE_PROVIDER_SITE_OTHER): Payer: Self-pay | Admitting: Internal Medicine

## 2019-11-12 ENCOUNTER — Other Ambulatory Visit (INDEPENDENT_AMBULATORY_CARE_PROVIDER_SITE_OTHER): Payer: No Typology Code available for payment source

## 2019-11-12 ENCOUNTER — Other Ambulatory Visit: Payer: Self-pay

## 2019-11-12 VITALS — BP 148/70 | HR 68 | Temp 98.2°F | Ht 59.5 in | Wt 162.5 lb

## 2019-11-12 DIAGNOSIS — N289 Disorder of kidney and ureter, unspecified: Secondary | ICD-10-CM

## 2019-11-12 DIAGNOSIS — E1142 Type 2 diabetes mellitus with diabetic polyneuropathy: Secondary | ICD-10-CM

## 2019-11-12 DIAGNOSIS — K760 Fatty (change of) liver, not elsewhere classified: Secondary | ICD-10-CM

## 2019-11-12 DIAGNOSIS — K7460 Unspecified cirrhosis of liver: Secondary | ICD-10-CM

## 2019-11-12 LAB — FERRITIN: Ferritin: 58.9 ng/mL (ref 10.0–291.0)

## 2019-11-12 LAB — VITAMIN B12: Vitamin B-12: 349 pg/mL (ref 211–911)

## 2019-11-12 LAB — PROTIME-INR
INR: 1.2 ratio — ABNORMAL HIGH (ref 0.8–1.0)
Prothrombin Time: 14 s — ABNORMAL HIGH (ref 9.6–13.1)

## 2019-11-12 NOTE — Progress Notes (Signed)
Olds 63 y.o. 1956/10/17 [id number]  Assessment & Plan:   Encounter Diagnoses  Name Primary?  . Cirrhosis of liver without ascites, unspecified hepatic cirrhosis type ([place]) suspect [place] Yes  . NAFLD (nonalcoholic fatty liver disease)   . Diabetic polyneuropathy associated with type 2 diabetes mellitus ([place])   . Kidney lesion, native, left     Cirrhosis of liver without ascites ([place]) I explained this through the interpreter today.  She appears to be a child's a. INR was normal.  She does have a mildly positive ANA.  I still suspect NASH which can have a mildly positive ANA.  She has an MRI coming for the kidney lesion.  Hold biopsy in reserve at this point.  She is nave to hepatitis B but immune to hepatitis A and will need vaccination.  Hepatitis B that is.  Handout provided  Kidney lesion, native, left Statistically likely benign but needs MRI imaging to sort out further.  Diabetic polyneuropathy associated with type 2 diabetes mellitus ([place]) I think her pain in the abdomen is related to this she is only using trazodone as needed I want her to take it nightly to see if that will treat this pain consider dose escalation if it helps but not completely.  Handout about diabetic neuropathy provided.    Subjective:   Chief Complaint: Cirrhosis  HPI The patient presents with an interpreter, she has a new diagnosis of cirrhosis by imaging.  Colonoscopy the EGD was normal she is been having abdominal pains chronically for years and also has signs and symptoms of diabetic neuropathy.  She takes trazodone at bedtime as needed sleep but not on a regular basis.  She is never been on gabapentin or Lyrica that I am aware of. Lab Results  Component Value Date   HGBA1C 7.6 (A) 10/01/2019  No retinopathy.  Not sure she goes to the eye doctor every year.  The pain in the abdomen is sort of a constant pain that is "there".  She does have insomnia.  Has chronic headaches will  use sumatriptan as needed with relief when the headaches are "bad".  Seems to have a headache most days.  The abdominal pain keeps her up at night as well.  Constipation is an issue still, every 2 to 3 days moves her bowels.  No help with abdominal pain when she defecates.  Allergies  Allergen Reactions  . Gadolinium Derivatives Nausea And Vomiting    Code: VOM, Desc: Pt began vomiting 45 sec after MRI contrast injection of Multihance, Onset Date: [id number]    . Iodinated Diagnostic Agents    Current Meds  Medication Sig  . acetaminophen (TYLENOL) 500 MG tablet Take 1 tablet (500 mg total) by mouth every 6 (six) hours as needed.  Marland Kitchen albuterol (PROVENTIL HFA;VENTOLIN HFA) 108 (90 Base) MCG/ACT inhaler Inhale 1-2 puffs into the lungs every 6 (six) hours as needed for wheezing or shortness of breath.  Marland Kitchen aspirin 325 MG EC tablet Take 1 tablet (325 mg total) by mouth daily.  Marland Kitchen atorvastatin (LIPITOR) 40 MG tablet Take 1 tablet (40 mg total) by mouth daily.  . carvedilol (COREG) 12.5 MG tablet Take 1 tablet (12.5 mg total) by mouth 2 (two) times daily with a meal.  . glipiZIDE (GLUCOTROL) 10 MG tablet Take 1 tablet (10 mg total) by mouth 2 (two) times daily before a meal.  . Insulin Glargine (LANTUS SOLOSTAR) 100 UNIT/ML Solostar Pen Inject 23 Units into the skin 2 (two) times daily.  Marland Kitchen  lisinopril (ZESTRIL) 10 MG tablet Take 1 tablet (10 mg total) by mouth daily.  Marland Kitchen olopatadine (PATANOL) 0.1 % ophthalmic solution Place 1 drop into both eyes 2 (two) times daily.  . pantoprazole (PROTONIX) 40 MG tablet Take 1 tablet (40 mg total) by mouth daily.  . promethazine (PHENERGAN) 25 MG tablet Take 1 tablet (25 mg total) by mouth every 8 (eight) hours as needed for nausea or vomiting.  . SUMAtriptan (IMITREX) 25 MG tablet Take 1 tablet (25 mg total) by mouth once for 1 dose. Please take 1 tablet at outset of headache.May repeat in 2 hours if headache persists or recurs.  . topiramate (TOPAMAX) 50 MG tablet Take  1 tablet (50 mg total) by mouth 2 (two) times daily.  . traZODone (DESYREL) 50 MG tablet Take 1 tablet (50 mg total) by mouth at bedtime as needed for sleep.  . TRUEPLUS PEN NEEDLES 31G X 6 MM MISC USE FOR NIGHTLY INSULIN INJECTIONS   Current Facility-Administered Medications for the 11/12/19 encounter (Office Visit) with Gatha Mayer, MD  Medication  . 0.9 %  sodium chloride infusion   Past Medical History:  Diagnosis Date  . Abdominal pain   . Allergy   . Arthralgia 08/13/2013  . Bell palsy 2006  . Bell's palsy   . Breast cancer ([place]) 2009   Left Breast Cancer  . Cataract   . Chest pain 09/30/2013  . Cholelithiasis   . Colon cancer screening 10/27/2015  . Complication of anesthesia    " tubal ligation, in [place]"  had weakness, couldnt stand up the next day, was given pills for oxygen for Brain."  1 month after I was having loss of attentiviness, still have to focus  carefully.   . Coronary artery disease   . Depression   . Diabetes mellitus    Type II  . Dyspnea    at times- when air conditioner is runnimg, heat also   . Encounter for screening colonoscopy 10/30/2015  . GERD (gastroesophageal reflux disease)   . Hepatic steatosis   . History of breast cancer 06/01/2012  . HTN (hypertension)   . Hx of adenomatous polyp of colon 11/07/2019  . Hyperlipidemia   . Personal history of chemotherapy 2009   Left Breast Cancer  . Personal history of radiation therapy 2009   Left Breast Cancer  . Stroke [facility])    2006, 2015   Past Surgical History:  Procedure Laterality Date  . ANKLE ARTHROSCOPY Right 12/14/2016   Procedure: Right Ankle Arthroscopic Debridement;  Surgeon: Newt Minion, MD;  Location: [place];  Service: Orthopedics;  Laterality: Right;  . Arm surgery     Left tendon lengthen  . BREAST LUMPECTOMY Left 2009  . CHOLECYSTECTOMY N/A 12/09/2015   Procedure: LAPAROSCOPIC CHOLECYSTECTOMY WITH INTRAOPERATIVE CHOLANGIOGRAM;  Surgeon: Greer Pickerel, MD;  Location: [place];   Service: General;  Laterality: N/A;  . LEFT HEART CATHETERIZATION WITH CORONARY ANGIOGRAM N/A 10/30/2013   Procedure: LEFT HEART CATHETERIZATION WITH CORONARY ANGIOGRAM;  Surgeon: Josue Hector, MD;  Location: [facility] CATH LAB;  Service: Cardiovascular;  Laterality: N/A;  . porta cath Right   . Removal of Porta cath Right   . TUBAL LIGATION     Social History   Social History Narrative   Single with 6 children    lives with her daughter   Right handed   Never smoker no drug use no alcohol   family history includes Diabetes in her mother; Hypertension in her mother; Migraines  in her daughter; Thyroid cancer in her sister.   Review of Systems As above  Objective:   Physical Exam @BP  (!) 148/70 (BP Location: Right Arm, Patient Position: Sitting, Cuff Size: Normal)   Pulse 68   Temp 98.2 F (36.8 C)   Ht 4' 11.5" (1.511 m)   Wt 162 lb 8 oz (73.7 kg)   BMI 32.27 kg/m @  General:  NAD Eyes:   anicteric Lungs:  clear Heart::  S1S2 no rubs, murmurs or gallops Abdomen:  soft and mildly diffusely tender, hyperesthetic, BS+ Ext:   no edema, cyanosis or clubbing No stigmata of chronic liver disease   Data Reviewed:  See HPI

## 2019-11-12 NOTE — Patient Instructions (Signed)
You have been scheduled for an MRI at _____________ on ___________________. Your appointment time is _____________________. Please arrive 15 minutes prior to your appointment time for registration purposes. Please make certain not to have anything to eat or drink 6 hours prior to your test. In addition, if you have any metal in your body, have a pacemaker or defibrillator, please be sure to let your ordering physician know. This test typically takes 45 minutes to 1 hour to complete. Should you need to reschedule, please call [phone] to do so.   Your provider has requested that you go to the basement level for lab work before leaving today. Press "B" on the elevator. The lab is located at the first door on the left as you exit the elevator.   Take trazodone every night.   I appreciate the opportunity to care for you. Silvano Rusk, MD, [facility]

## 2019-11-14 ENCOUNTER — Encounter: Payer: Self-pay | Admitting: Internal Medicine

## 2019-11-14 DIAGNOSIS — K7469 Other cirrhosis of liver: Secondary | ICD-10-CM | POA: Insufficient documentation

## 2019-11-14 DIAGNOSIS — N289 Disorder of kidney and ureter, unspecified: Secondary | ICD-10-CM | POA: Insufficient documentation

## 2019-11-14 DIAGNOSIS — K7460 Unspecified cirrhosis of liver: Secondary | ICD-10-CM | POA: Insufficient documentation

## 2019-11-14 NOTE — Assessment & Plan Note (Addendum)
I explained this through the interpreter today.  She appears to be a child's a. INR was normal.  She does have a mildly positive ANA.  I still suspect NASH which can have a mildly positive ANA.  She has an MRI coming for the kidney lesion.  Hold biopsy in reserve at this point.  She is nave to hepatitis B but immune to hepatitis A and will need vaccination.  Hepatitis B that is.  Handout provided

## 2019-11-14 NOTE — Assessment & Plan Note (Addendum)
I think her pain in the abdomen is related to this she is only using trazodone as needed I want her to take it nightly to see if that will treat this pain consider dose escalation if it helps but not completely.  Handout about diabetic neuropathy provided.

## 2019-11-14 NOTE — Assessment & Plan Note (Signed)
Statistically likely benign but needs MRI imaging to sort out further.

## 2019-11-19 LAB — HEPATITIS B SURFACE ANTIBODY,QUALITATIVE: Hep B S Ab: NONREACTIVE

## 2019-11-19 LAB — ANTI-NUCLEAR AB-TITER (ANA TITER): ANA Titer 1: 1:40 {titer} — ABNORMAL HIGH

## 2019-11-19 LAB — HEPATITIS A ANTIBODY, TOTAL: Hepatitis A AB,Total: REACTIVE — AB

## 2019-11-19 LAB — ANTI-SMOOTH MUSCLE ANTIBODY, IGG: Actin (Smooth Muscle) Antibody (IGG): <20 U (ref <20)

## 2019-11-19 LAB — ANA: Anti Nuclear Antibody (ANA): POSITIVE — AB

## 2019-11-19 LAB — HEPATITIS B SURFACE ANTIGEN: Hepatitis B Surface Ag: NONREACTIVE

## 2019-11-19 LAB — HEPATITIS B CORE ANTIBODY, TOTAL: Hep B Core Total Ab: NONREACTIVE

## 2019-11-26 ENCOUNTER — Other Ambulatory Visit: Payer: Self-pay | Admitting: Pharmacist

## 2019-11-26 ENCOUNTER — Other Ambulatory Visit: Payer: Self-pay

## 2019-11-26 MED ORDER — TRUEPLUS 5-BEVEL PEN NEEDLES 32G X 4 MM MISC: 11 refills | Status: DC

## 2019-11-26 MED FILL — glipiZIDE 10 MG TABS: 10 | 30 days supply | Qty: 60 | Fill #2

## 2019-11-26 MED FILL — TRUEPLUS PEN NDL 32GX5/32: 32G X 4 MM | 30 days supply | Qty: 100 | Fill #0

## 2019-11-26 MED FILL — CARVEDILOL 12.5 MG TABLET: 12.5 | 30 days supply | Qty: 60 | Fill #1

## 2019-11-26 MED FILL — LISINOPRIL 10 MG TABS: 10 | 30 days supply | Qty: 30 | Fill #1

## 2019-11-26 MED FILL — TRUEPLUS PEN NDL 32GX5/32": 32G X 4 MM | 30 days supply | Qty: 100 | Fill #0

## 2019-12-02 ENCOUNTER — Ambulatory Visit (INDEPENDENT_AMBULATORY_CARE_PROVIDER_SITE_OTHER): Payer: Self-pay | Admitting: Internal Medicine

## 2019-12-02 DIAGNOSIS — Z23 Encounter for immunization: Secondary | ICD-10-CM

## 2019-12-02 NOTE — Progress Notes (Signed)
Patient came for her 1st Hep B injection. The Order in place is for an Adult Hep B [id number]) dose but The dosing we have now is the pediatric dose. The order for the adult dose was deleted and an order for the pediatric dose [id number] was entered and administered. Patient was scheduled an appt for her 2nd injection on 01-02-2020. An order was not entered since I do not know which dosing we will have at the time of the patient's next appt.

## 2020-01-02 ENCOUNTER — Ambulatory Visit (INDEPENDENT_AMBULATORY_CARE_PROVIDER_SITE_OTHER): Payer: Self-pay | Admitting: Internal Medicine

## 2020-01-02 DIAGNOSIS — Z23 Encounter for immunization: Secondary | ICD-10-CM

## 2020-01-03 MED FILL — glipiZIDE 10 MG TABS: 10 | 30 days supply | Qty: 60 | Fill #3

## 2020-01-03 MED FILL — LISINOPRIL 10 MG TABS: 10 | 30 days supply | Qty: 30 | Fill #2

## 2020-01-03 MED FILL — ?CARVEDILOL 12.5 MG TABLET: 12.5 | 30 days supply | Qty: 60 | Fill #2

## 2020-01-18 ENCOUNTER — Encounter (HOSPITAL_COMMUNITY): Payer: Self-pay | Admitting: Emergency Medicine

## 2020-01-18 ENCOUNTER — Emergency Department (HOSPITAL_COMMUNITY)
Admission: EM | Admit: 2020-01-18 | Discharge: 2020-01-19 | Disposition: A | Discharge status: Home / Self Care | Payer: No Typology Code available for payment source | Attending: Emergency Medicine | Admitting: Emergency Medicine

## 2020-01-18 ENCOUNTER — Emergency Department (HOSPITAL_COMMUNITY): Payer: No Typology Code available for payment source

## 2020-01-18 ENCOUNTER — Other Ambulatory Visit: Payer: Self-pay

## 2020-01-18 DIAGNOSIS — Z853 Personal history of malignant neoplasm of breast: Secondary | ICD-10-CM | POA: Insufficient documentation

## 2020-01-18 DIAGNOSIS — Z79899 Other long term (current) drug therapy: Secondary | ICD-10-CM | POA: Insufficient documentation

## 2020-01-18 DIAGNOSIS — R531 Weakness: Secondary | ICD-10-CM | POA: Insufficient documentation

## 2020-01-18 DIAGNOSIS — Z794 Long term (current) use of insulin: Secondary | ICD-10-CM | POA: Insufficient documentation

## 2020-01-18 DIAGNOSIS — M5126 Other intervertebral disc displacement, lumbar region: Secondary | ICD-10-CM | POA: Insufficient documentation

## 2020-01-18 DIAGNOSIS — R519 Headache, unspecified: Secondary | ICD-10-CM | POA: Insufficient documentation

## 2020-01-18 DIAGNOSIS — Y939 Activity, unspecified: Secondary | ICD-10-CM | POA: Insufficient documentation

## 2020-01-18 DIAGNOSIS — I10 Essential (primary) hypertension: Secondary | ICD-10-CM | POA: Insufficient documentation

## 2020-01-18 DIAGNOSIS — Z7982 Long term (current) use of aspirin: Secondary | ICD-10-CM | POA: Insufficient documentation

## 2020-01-18 DIAGNOSIS — E119 Type 2 diabetes mellitus without complications: Secondary | ICD-10-CM | POA: Insufficient documentation

## 2020-01-18 DIAGNOSIS — M7062 Trochanteric bursitis, left hip: Secondary | ICD-10-CM

## 2020-01-18 LAB — CBC WITH DIFFERENTIAL/PLATELET
Abs Immature Granulocytes: 0.02 10*3/uL (ref 0.00–0.07)
Basophils Absolute: 0 10*3/uL (ref 0.0–0.1)
Basophils Relative: 0 %
Eosinophils Absolute: 0.3 10*3/uL (ref 0.0–0.5)
Eosinophils Relative: 3 %
HCT: 43.7 % (ref 36.0–46.0)
Hemoglobin: 15.1 g/dL — ABNORMAL HIGH (ref 12.0–15.0)
Immature Granulocytes: 0 %
Lymphocytes Relative: 27 %
Lymphs Abs: 2.1 10*3/uL (ref 0.7–4.0)
MCH: 30.2 pg (ref 26.0–34.0)
MCHC: 34.6 g/dL (ref 30.0–36.0)
MCV: 87.4 fL (ref 80.0–100.0)
Monocytes Absolute: 0.8 10*3/uL (ref 0.1–1.0)
Monocytes Relative: 10 %
Neutro Abs: 4.7 10*3/uL (ref 1.7–7.7)
Neutrophils Relative %: 60 %
Platelets: ADEQUATE 10*3/uL (ref 150–400)
RBC: 5 MIL/uL (ref 3.87–5.11)
RDW: 12 % (ref 11.5–15.5)
WBC: 8 10*3/uL (ref 4.0–10.5)
nRBC: 0 % (ref 0.0–0.2)

## 2020-01-18 LAB — BASIC METABOLIC PANEL
Anion gap: 10 (ref 5–15)
BUN: 11 mg/dL (ref 8–23)
CO2: 24 mmol/L (ref 22–32)
Calcium: 9.4 mg/dL (ref 8.9–10.3)
Chloride: 107 mmol/L (ref 98–111)
Creatinine, Ser: 0.64 mg/dL (ref 0.44–1.00)
GFR calc Af Amer: >60 mL/min (ref >60)
GFR calc non Af Amer: >60 mL/min (ref >60)
Glucose, Bld: 121 mg/dL — ABNORMAL HIGH (ref 70–99)
Potassium: 3.9 mmol/L (ref 3.5–5.1)
Sodium: 141 mmol/L (ref 135–145)

## 2020-01-18 MED ORDER — MORPHINE SULFATE (PF) 4 MG/ML IV SOLN
4.0000 mg | Freq: Once | INTRAVENOUS | Status: AC
  Administered 2020-01-18: 19:00:00 4 mg via INTRAVENOUS
  Filled 2020-01-18: qty 1

## 2020-01-18 MED ORDER — DICLOFENAC SODIUM 1 % EX GEL: CUTANEOUS | 0 refills | Status: DC

## 2020-01-18 MED ORDER — PREDNISONE 20 MG PO TABS: ORAL_TABLET | ORAL | 0 refills | Status: DC

## 2020-01-18 MED ORDER — DEXAMETHASONE SODIUM PHOSPHATE 10 MG/ML IJ SOLN
10.0000 mg | Freq: Once | INTRAMUSCULAR | Status: AC
  Administered 2020-01-18: 19:00:00 10 mg via INTRAVENOUS
  Filled 2020-01-18: qty 1

## 2020-01-18 MED ORDER — IBUPROFEN 800 MG PO TABS
800.0000 mg | ORAL_TABLET | Freq: Once | ORAL | Status: AC
  Administered 2020-01-19: 800 mg via ORAL
  Filled 2020-01-18: qty 1

## 2020-01-18 MED ORDER — IBUPROFEN 600 MG PO TABS: 600.0000 mg | ORAL_TABLET | Freq: Four times a day (QID) | ORAL | 0 refills | Status: DC | PRN

## 2020-01-18 NOTE — ED Triage Notes (Signed)
Spanish interpretor utilized to obtain triage info  Patient reports L left pain that makes it difficult to stand or walk. She reports that pain began this morning. Patient denies fall or injury. She denies back pain, swelling or redness.

## 2020-01-18 NOTE — ED Provider Notes (Signed)
[facility] EMERGENCY DEPARTMENT Provider Note   CSN: [id number] Arrival date & time: 01/18/20  1306     History Chief Complaint  Patient presents with  . Leg Pain    Brandi Bryant is a 64 y.o. female.  The history is provided by the patient. The history is limited by a language barrier. A language interpreter was used.     64 year old Hispanic female with history of prior stroke, diabetes, CAD, migraine, hypertension, depression presenting for evaluation of left leg pain.  History obtained from which interpreter.  Patient report she woke up this morning and when she tried to get out of bed she experienced excruciating pain throughout her left leg.  Pain started left gluteal region, radiates down her leg, worse with positional change.  She endorsed heaviness sensation in her leg and was unable to ambulate requiring her son to carry her.  At rest, pain is tolerable, with movement pain is 10 out of 10.  She endorsed a throbbing headache which is not new.  She does not complain of any vision changes, fever, chills, chest pain, trouble breathing, abdominal pain, bowel bladder incontinence or saddle anesthesia.  Past Medical History:  Diagnosis Date  . Abdominal pain   . Allergy   . Arthralgia 08/13/2013  . Bell palsy 2006  . Bell's palsy   . Breast cancer ([place]) 2009   Left Breast Cancer  . Cataract   . Chest pain 09/30/2013  . Cholelithiasis   . Colon cancer screening 10/27/2015  . Complication of anesthesia    " tubal ligation, in [place]"  had weakness, couldnt stand up the next day, was given pills for oxygen for Brain."  1 month after I was having loss of attentiviness, still have to focus  carefully.   . Coronary artery disease   . Depression   . Diabetes mellitus    Type II  . Dyspnea    at times- when air conditioner is runnimg, heat also   . Encounter for screening colonoscopy 10/30/2015  . GERD (gastroesophageal reflux disease)   . Hepatic  steatosis   . History of breast cancer 06/01/2012  . HTN (hypertension)   . Hx of adenomatous polyp of colon 11/07/2019  . Hyperlipidemia   . Personal history of chemotherapy 2009   Left Breast Cancer  . Personal history of radiation therapy 2009   Left Breast Cancer  . Stroke [facility])    2006, 2015    Patient Active Problem List   Diagnosis Date Noted  . Cirrhosis of liver without ascites ([place]) 11/14/2019  . Kidney lesion, native, left 11/14/2019  . Hx of adenomatous polyp of colon 11/07/2019  . Well woman exam with routine gynecological exam 08/06/2019  . Breast pain, right 08/06/2019  . Internal carotid artery stenosis 04/03/2018  . CVA (cerebral vascular accident) ([place]) 03/30/2018  . GERD (gastroesophageal reflux disease) 11/27/2017  . Chronic sinusitis 05/16/2017  . Diabetic polyneuropathy associated with type 2 diabetes mellitus ([place]) 10/31/2016  . Impingement syndrome of right ankle 10/31/2016  . Symptomatic cholelithiasis 12/09/2015  . Palpitations 08/28/2014  . Preventive measure 08/21/2014  . Intractable headache 02/21/2014  . TIA (transient ischemic attack) 02/20/2014  . Trigeminal neuralgia 01/22/2014  . Weakness 01/22/2014  . Bell's palsy 01/21/2014  . UTI (urinary tract infection) 01/21/2014  . NAFLD (nonalcoholic fatty liver disease) 01/21/2014  . Headache 01/20/2014  . Diabetes type 2, uncontrolled ([place]) 01/20/2014  . Facial droop 01/15/2014  . Stroke ([place]) 01/15/2014  . Type  2 diabetes mellitus with diabetic neuropathy ([place]) 12/06/2013  . Hyperlipidemia 11/07/2013  . CAD (coronary artery disease), native coronary artery 11/07/2013  . Chest pain 09/30/2013  . Arthralgia 08/13/2013  . History of breast cancer 06/01/2012  . Abdominal pain 01/31/2012  . HTN (hypertension)   . Diabetes mellitus ([place])     Past Surgical History:  Procedure Laterality Date  . ANKLE ARTHROSCOPY Right 12/14/2016   Procedure: Right Ankle Arthroscopic Debridement;  Surgeon:  Newt Minion, MD;  Location: [facility];  Service: Orthopedics;  Laterality: Right;  . Arm surgery     Left tendon lengthen  . BREAST LUMPECTOMY Left 2009  . CHOLECYSTECTOMY N/A 12/09/2015   Procedure: LAPAROSCOPIC CHOLECYSTECTOMY WITH INTRAOPERATIVE CHOLANGIOGRAM;  Surgeon: Greer Pickerel, MD;  Location: [facility];  Service: General;  Laterality: N/A;  . LEFT HEART CATHETERIZATION WITH CORONARY ANGIOGRAM N/A 10/30/2013   Procedure: LEFT HEART CATHETERIZATION WITH CORONARY ANGIOGRAM;  Surgeon: Josue Hector, MD;  Location: [facility] CATH LAB;  Service: Cardiovascular;  Laterality: N/A;  . porta cath Right   . Removal of Porta cath Right   . TUBAL LIGATION       OB History    Gravida  6   Para  6   Term  6   Preterm      AB      Living  6     SAB      TAB      Ectopic      Multiple      Live Births              Family History  Problem Relation Age of Onset  . Thyroid cancer Sister   . Hypertension Mother   . Diabetes Mother   . Migraines Daughter   . Colon cancer Neg Hx   . Esophageal cancer Neg Hx   . Stomach cancer Neg Hx   . Rectal cancer Neg Hx     Social History   Tobacco Use  . Smoking status: Never Smoker  . Smokeless tobacco: Never Used  Substance Use Topics  . Alcohol use: No  . Drug use: No    Home Medications Prior to Admission medications   Medication Sig Start Date End Date Taking? Authorizing Provider  acetaminophen (TYLENOL) 500 MG tablet Take 1 tablet (500 mg total) by mouth every 6 (six) hours as needed. 09/26/19   Lamptey, Myrene Galas, MD  albuterol (PROVENTIL HFA;VENTOLIN HFA) 108 (90 Base) MCG/ACT inhaler Inhale 1-2 puffs into the lungs every 6 (six) hours as needed for wheezing or shortness of breath. 04/25/17   Charlott Rakes, MD  aspirin 325 MG EC tablet Take 1 tablet (325 mg total) by mouth daily. 06/14/18   Argentina Donovan, PA-C  atorvastatin (LIPITOR) 40 MG tablet Take 1 tablet (40 mg total) by mouth daily. 10/01/19   Charlott Rakes, MD    carvedilol (COREG) 12.5 MG tablet Take 1 tablet (12.5 mg total) by mouth 2 (two) times daily with a meal. 10/01/19   Newlin, Enobong, MD  glipiZIDE (GLUCOTROL) 10 MG tablet Take 1 tablet (10 mg total) by mouth 2 (two) times daily before a meal. 10/01/19   Charlott Rakes, MD  Insulin Glargine (LANTUS SOLOSTAR) 100 UNIT/ML Solostar Pen Inject 23 Units into the skin 2 (two) times daily. 10/01/19   Charlott Rakes, MD  Insulin Pen Needle (TRUEPLUS 5-BEVEL PEN NEEDLES) 32G X 4 MM MISC Use to inject Lantus daily. 11/26/19   Charlott Rakes, MD  lisinopril (  ZESTRIL) 10 MG tablet Take 1 tablet (10 mg total) by mouth daily. 10/01/19   Charlott Rakes, MD  olopatadine (PATANOL) 0.1 % ophthalmic solution Place 1 drop into both eyes 2 (two) times daily. 03/26/19   Charlott Rakes, MD  pantoprazole (PROTONIX) 40 MG tablet Take 1 tablet (40 mg total) by mouth daily. 10/01/19   Charlott Rakes, MD  promethazine (PHENERGAN) 25 MG tablet Take 1 tablet (25 mg total) by mouth every 8 (eight) hours as needed for nausea or vomiting. 10/30/18   Charlott Rakes, MD  SUMAtriptan (IMITREX) 25 MG tablet Take 1 tablet (25 mg total) by mouth once for 1 dose. Please take 1 tablet at outset of headache.May repeat in 2 hours if headache persists or recurs. 10/01/19 10/28/20  Charlott Rakes, MD  topiramate (TOPAMAX) 50 MG tablet Take 1 tablet (50 mg total) by mouth 2 (two) times daily. 10/01/19   Charlott Rakes, MD  traZODone (DESYREL) 50 MG tablet Take 1 tablet (50 mg total) by mouth at bedtime as needed for sleep. 10/01/19   Charlott Rakes, MD  escitalopram (LEXAPRO) 10 MG tablet Take 10 mg by mouth daily.  01/31/12  [provider]    Allergies    Gadolinium derivatives and Iodinated diagnostic agents  Review of Systems   Review of Systems  Constitutional: Negative for fever.  Musculoskeletal: Positive for arthralgias.  Skin: Negative for wound.  Neurological: Positive for weakness. Negative for numbness.     Physical Exam Updated Vital Signs BP 138/66 (BP Location: Left Arm)   Pulse 61   Temp 98.2 F (36.8 C) (Oral)   Resp 18   SpO2 95%   Physical Exam Vitals and nursing note reviewed.  Constitutional:      General: She is not in acute distress.    Appearance: She is well-developed.  HENT:     Head: Atraumatic.  Eyes:     Conjunctiva/sclera: Conjunctivae normal.  Abdominal:     Palpations: Abdomen is soft.     Tenderness: There is no abdominal tenderness.  Musculoskeletal:        General: Tenderness (Tenderness to left hip on palpation with decreased hip flexion abduction abduction secondary to pain.  No obvious deformity noted.  Tenderness with left straight leg raise.  Intact distal pulses.) present.     Cervical back: Neck supple.     Comments: No midline spine tenderness  Skin:    Capillary Refill: Capillary refill takes less than 2 seconds.     Findings: No rash.  Neurological:     Mental Status: She is alert.     Comments: Patella DTR intact bilaterally.  No foot drop     ED Results / Procedures / Treatments   Labs (all labs ordered are listed, but only abnormal results are displayed) Labs Reviewed  BASIC METABOLIC PANEL - Abnormal; Notable for the following components:      Result Value   Glucose, Bld 121 (*)    All other components within normal limits  CBC WITH DIFFERENTIAL/PLATELET - Abnormal; Notable for the following components:   Hemoglobin 15.1 (*)    All other components within normal limits    EKG None  Radiology CT Head Wo Contrast  Result Date: 01/18/2020 CLINICAL DATA:  64 year old female with head trauma. EXAM: CT HEAD WITHOUT CONTRAST TECHNIQUE: Contiguous axial images were obtained from the base of the skull through the vertex without intravenous contrast. COMPARISON:  Head CT dated 03/30/2018. FINDINGS: Brain: Mild age-related atrophy and chronic microvascular ischemic changes.  Old left occipital infarct. Stable 1 cm hypodense lesion in  the left lentiform nucleus may represent an old lacunar infarct versus a dilated prevascular space. There is no acute intracranial hemorrhage. No mass effect or midline shift. No extra-axial fluid collection. Vascular: No hyperdense vessel or unexpected calcification. Skull: Normal. Negative for fracture or focal lesion. Sinuses/Orbits: No acute finding. Other: None IMPRESSION: 1. No acute intracranial pathology. 2. Age-related atrophy and chronic microvascular ischemic changes. Old left occipital infarct. Electronically Signed   By: Anner Crete M.D.   On: 01/18/2020 17:51   DG Hip Unilat W or Wo Pelvis 2-3 Views Left  Result Date: 01/18/2020 CLINICAL DATA:  Left anterior hip pain. EXAM: DG HIP (WITH OR WITHOUT PELVIS) 2-3V LEFT COMPARISON:  None. FINDINGS: There is no acute displaced fracture or dislocation. There is mild bilateral hip osteoarthritis. There are few rounded calcifications projecting near the greater trochanter of the proximal left femur. IMPRESSION: 1. No acute displaced fracture or dislocation. 2. Mild bilateral hip osteoarthritis. 3. Nonspecific calcifications adjacent to the greater trochanter of the left femur. This can be seen in patients with trochanteric bursitis in the appropriate clinical setting. Electronically Signed   By: Constance Holster M.D.   On: 01/18/2020 17:23    Procedures Procedures (including critical care time)  Medications Ordered in ED Medications  ibuprofen (ADVIL) tablet 800 mg (has no administration in time range)  morphine 4 MG/ML injection 4 mg (4 mg Intravenous Given 01/18/20 1843)  dexamethasone (DECADRON) injection 10 mg (10 mg Intravenous Given 01/18/20 1843)    ED Course  I have reviewed the triage vital signs and the nursing notes.  Pertinent labs & imaging results that were available during my care of the patient were reviewed by me and considered in my medical decision making (see chart for details).    MDM Rules/Calculators/A&P                       BP (!) 128/51 (BP Location: Left Arm)   Pulse 65   Temp 98.2 F (36.8 C) (Oral)   Resp 12   SpO2 97%   Final Clinical Impression(s) / ED Diagnoses Final diagnoses:  Greater trochanteric bursitis of left hip    Rx / DC Orders ED Discharge Orders         Ordered    predniSONE (DELTASONE) 20 MG tablet     01/18/20 2344    diclofenac Sodium (VOLTAREN) 1 % GEL     01/18/20 2344    ibuprofen (ADVIL) 600 MG tablet  Every 6 hours PRN     01/18/20 2344         Hispanic female here with acute onset of atraumatic left leg pain.  She was unable to ambulate requiring family member to carry her due to her pain.  Pain reside primarily to her left hip region with positive straight leg raise.  Suspect radicular pain but due to inability to ambulate, I discussed care with Dr. Sherry Ruffing and we decided to get an MRI of her lumbar spine for evaluation.  She initially endorsed some headache as well and she has prior history of stroke.  Head CT scan unremarkable without any acute finding.  Labs are reassuring.  X-ray of her hips and pelvis show no acute finding however there is nonspecific calcification to the greater trochanter of the left femur which can be seen in patients with trochanteric bursitis.    If lumbar spine MRI is unremarkable, patient will  be treated for trochanteric bursitis.  Patient was given steroid and morphine for symptom control.  11:45 PM Pt sign out to oncoming team who will f/u on MRI result. If negative, will treat for L trochanteric bursitis.    Domenic Moras, PA-C 01/18/20 2346    Tegeler, Gwenyth Allegra, MD 01/19/20 [phone]

## 2020-01-18 NOTE — Discharge Instructions (Signed)
You have been diagnosed with bursitis of your left hip.  Apply voltaren gel to the affected area 4 times daily for 1 week for pain control.  Take medications prescribed.  Be aware that taking steroid may affect your blood sugar, so monitor it closely. Return if you have any concerns.

## 2020-01-19 MED ORDER — ONDANSETRON HCL 4 MG PO TABS
4.0000 mg | ORAL_TABLET | Freq: Once | ORAL | Status: AC
  Administered 2020-01-19: 01:00:00 4 mg via ORAL
  Filled 2020-01-19: qty 1

## 2020-01-19 NOTE — ED Provider Notes (Signed)
Discussed the results of the MRI with the patient.  Advised follow-up with neurosurg and PCP for bursitis and herniated discs.    Spanish interpreter used.  All questions answered.   Montine Circle, PA-C 01/19/20 [place], [place], MD 01/19/20 [phone]

## 2020-01-20 MED FILL — ?PREDINSONE 10MG TABLETS: 10 | 5 days supply | Qty: 22 | Fill #0

## 2020-01-20 MED FILL — ?IBUPROFEN 600 MG TABLETS: 600 | 7 days supply | Qty: 30 | Fill #0

## 2020-01-20 MED FILL — DICLOFENAC SODIUM 1 % GEL: 1 | 7 days supply | Qty: 100 | Fill #0

## 2020-02-10 MED FILL — ?CARVEDILOL 12.5 MG TABLET: 12.5 | 30 days supply | Qty: 60 | Fill #3

## 2020-02-10 MED FILL — TRUEPLUS PEN NDL 32GX5/32: 32G X 4 MM | 30 days supply | Qty: 100 | Fill #1

## 2020-02-10 MED FILL — TRUEPLUS PEN NDL 32GX5/32": 32G X 4 MM | 30 days supply | Qty: 100 | Fill #1

## 2020-02-10 MED FILL — LISINOPRIL 10 MG TABS: 10 | 30 days supply | Qty: 30 | Fill #3

## 2020-02-10 MED FILL — ?GLIPIZIDE 10 MG TABLET: 10 | 30 days supply | Qty: 60 | Fill #4

## 2020-02-10 MED FILL — LANTUS SOLOSTAR 100 UNITS/M: 100 | 32 days supply | Qty: 15 | Fill #0

## 2020-02-11 ENCOUNTER — Other Ambulatory Visit: Payer: Self-pay | Admitting: Physician Assistant

## 2020-02-11 ENCOUNTER — Ambulatory Visit: Payer: Self-pay | Attending: Family Medicine | Admitting: Physician Assistant

## 2020-02-11 ENCOUNTER — Other Ambulatory Visit: Payer: Self-pay

## 2020-02-11 VITALS — BP 134/82 | HR 61 | Temp 98.1°F | Resp 16 | Ht 60.0 in | Wt 164.0 lb

## 2020-02-11 DIAGNOSIS — E119 Type 2 diabetes mellitus without complications: Secondary | ICD-10-CM

## 2020-02-11 DIAGNOSIS — E1165 Type 2 diabetes mellitus with hyperglycemia: Secondary | ICD-10-CM

## 2020-02-11 DIAGNOSIS — Z794 Long term (current) use of insulin: Secondary | ICD-10-CM

## 2020-02-11 DIAGNOSIS — I10 Essential (primary) hypertension: Secondary | ICD-10-CM

## 2020-02-11 DIAGNOSIS — E1140 Type 2 diabetes mellitus with diabetic neuropathy, unspecified: Secondary | ICD-10-CM

## 2020-02-11 DIAGNOSIS — K219 Gastro-esophageal reflux disease without esophagitis: Secondary | ICD-10-CM

## 2020-02-11 DIAGNOSIS — M25552 Pain in left hip: Secondary | ICD-10-CM

## 2020-02-11 DIAGNOSIS — E7800 Pure hypercholesterolemia, unspecified: Secondary | ICD-10-CM

## 2020-02-11 DIAGNOSIS — G4700 Insomnia, unspecified: Secondary | ICD-10-CM

## 2020-02-11 DIAGNOSIS — R739 Hyperglycemia, unspecified: Secondary | ICD-10-CM

## 2020-02-11 DIAGNOSIS — G459 Transient cerebral ischemic attack, unspecified: Secondary | ICD-10-CM

## 2020-02-11 LAB — GLUCOSE, POCT (MANUAL RESULT ENTRY): POC Glucose: 142 mg/dl — AB (ref 70–99)

## 2020-02-11 MED ORDER — TRUEPLUS 5-BEVEL PEN NEEDLES 32G X 4 MM MISC: 11 refills | Status: DC

## 2020-02-11 MED ORDER — GLIPIZIDE 10 MG PO TABS: 10.0000 mg | ORAL_TABLET | Freq: Two times a day (BID) | ORAL | 6 refills | Status: DC

## 2020-02-11 MED ORDER — TRAZODONE HCL 100 MG PO TABS: 100.0000 mg | ORAL_TABLET | Freq: Every evening | ORAL | 3 refills | Status: DC | PRN

## 2020-02-11 MED ORDER — CARVEDILOL 12.5 MG PO TABS: 12.5000 mg | ORAL_TABLET | Freq: Two times a day (BID) | ORAL | 6 refills | Status: DC

## 2020-02-11 MED ORDER — INSULIN ASPART 100 UNIT/ML ~~LOC~~ SOLN
8.0000 [IU] | Freq: Once | SUBCUTANEOUS | Status: AC
  Administered 2020-02-11: 8 [IU] via SUBCUTANEOUS

## 2020-02-11 MED ORDER — ATORVASTATIN CALCIUM 40 MG PO TABS: 40.0000 mg | ORAL_TABLET | Freq: Every day | ORAL | 6 refills | Status: DC

## 2020-02-11 MED ORDER — LISINOPRIL 10 MG PO TABS: 10.0000 mg | ORAL_TABLET | Freq: Every day | ORAL | 6 refills | Status: DC

## 2020-02-11 MED ORDER — LANTUS SOLOSTAR 100 UNIT/ML ~~LOC~~ SOPN: 28.0000 [IU] | PEN_INJECTOR | Freq: Two times a day (BID) | SUBCUTANEOUS | 6 refills | Status: DC

## 2020-02-11 MED FILL — ?ATORVASTATIN 40MG TABLET: 40 | 30 days supply | Qty: 30 | Fill #0

## 2020-02-11 MED FILL — ?TRAZODONE HCL 100MG TABLE: 100 | 30 days supply | Qty: 30 | Fill #0

## 2020-02-11 NOTE — Progress Notes (Signed)
Established Patient Office Visit  Subjective:  Patient ID: Brandi Bryant, female    DOB: Sep 13, 1956  Age: 64 y.o. MRN: [id number]  CC:  Chief Complaint  Patient presents with  . Diabetes    HPI [facility] reports that she has been having increased blood sugars despite low sugar low-fat diet, taking her medications as directed.  Reports that she is checking her blood sugars twice a day, fasting in the morning and before eating dinner, states that she has seen blood sugars as high as 500.  Reports that she feels that she is gaining weight despite her efforts.  Reports that she did not start the Topamax, does not feel that she was having migraines, states that she feels her headaches were due to elevated blood pressure, now her blood pressure seems to be better under control and the headaches have resolved.  Reports that she is having difficulty falling asleep and staying asleep despite taking 50 mg of trazodone, endorses good sleep hygiene.  Reports that she was seen at the ED at the end of Feb 2021 for left hip pain, states that she was given a shot without relief.  Reports she continues to have pain in her left hip, worse with activity, denies numbness or tingling.   Reports colonoscopy December 2020.    Past Medical History:  Diagnosis Date  . Abdominal pain   . Allergy   . Arthralgia 08/13/2013  . Bell palsy 2006  . Bell's palsy   . Breast cancer ([place]) 2009   Left Breast Cancer  . Cataract   . Chest pain 09/30/2013  . Cholelithiasis   . Colon cancer screening 10/27/2015  . Complication of anesthesia    " tubal ligation, in [place]"  had weakness, couldnt stand up the next day, was given pills for oxygen for Brain."  1 month after I was having loss of attentiviness, still have to focus  carefully.   . Coronary artery disease   . Depression   . Diabetes mellitus    Type II  . Dyspnea    at times- when air conditioner is runnimg, heat also   .  Encounter for screening colonoscopy 10/30/2015  . GERD (gastroesophageal reflux disease)   . Hepatic steatosis   . History of breast cancer 06/01/2012  . HTN (hypertension)   . Hx of adenomatous polyp of colon 11/07/2019  . Hyperlipidemia   . Personal history of chemotherapy 2009   Left Breast Cancer  . Personal history of radiation therapy 2009   Left Breast Cancer  . Stroke [facility])    2006, 2015    Past Surgical History:  Procedure Laterality Date  . ANKLE ARTHROSCOPY Right 12/14/2016   Procedure: Right Ankle Arthroscopic Debridement;  Surgeon: Newt Minion, MD;  Location: [place];  Service: Orthopedics;  Laterality: Right;  . Arm surgery     Left tendon lengthen  . BREAST LUMPECTOMY Left 2009  . CHOLECYSTECTOMY N/A 12/09/2015   Procedure: LAPAROSCOPIC CHOLECYSTECTOMY WITH INTRAOPERATIVE CHOLANGIOGRAM;  Surgeon: Greer Pickerel, MD;  Location: [place];  Service: General;  Laterality: N/A;  . LEFT HEART CATHETERIZATION WITH CORONARY ANGIOGRAM N/A 10/30/2013   Procedure: LEFT HEART CATHETERIZATION WITH CORONARY ANGIOGRAM;  Surgeon: Josue Hector, MD;  Location: [facility] CATH LAB;  Service: Cardiovascular;  Laterality: N/A;  . porta cath Right   . Removal of Porta cath Right   . TUBAL LIGATION      Family History  Problem Relation Age of Onset  . Thyroid  cancer Sister   . Hypertension Mother   . Diabetes Mother   . Migraines Daughter   . Colon cancer Neg Hx   . Esophageal cancer Neg Hx   . Stomach cancer Neg Hx   . Rectal cancer Neg Hx     Social History   Socioeconomic History  . Marital status: Single    Spouse name: Not on file  . Number of children: 6  . Years of education: Not on file  . Highest education level: High school graduate  Occupational History  . Not on file  Tobacco Use  . Smoking status: Never Smoker  . Smokeless tobacco: Never Used  Substance and Sexual Activity  . Alcohol use: No  . Drug use: No  . Sexual activity: Not Currently    Birth  control/protection: Surgical  Other Topics Concern  . Not on file  Social History Narrative   Single with 6 children    lives with her daughter   Right handed   Never smoker no drug use no alcohol   Social Determinants of Health   Financial Resource Strain:   . Difficulty of Paying Living Expenses:   Food Insecurity:   . Worried About Charity fundraiser in the Last Year:   . Arboriculturist in the Last Year:   Transportation Needs: No Transportation Needs  . Lack of Transportation (Medical): No  . Lack of Transportation (Non-Medical): No  Physical Activity:   . Days of Exercise per Week:   . Minutes of Exercise per Session:   Stress:   . Feeling of Stress :   Social Connections:   . Frequency of Communication with Friends and Family:   . Frequency of Social Gatherings with Friends and Family:   . Attends Religious Services:   . Active Member of Clubs or Organizations:   . Attends Archivist Meetings:   Marland Kitchen Marital Status:   Intimate Partner Violence:   . Fear of Current or Ex-Partner:   . Emotionally Abused:   Marland Kitchen Physically Abused:   . Sexually Abused:     Outpatient Medications Prior to Visit  Medication Sig Dispense Refill  . aspirin 325 MG EC tablet Take 1 tablet (325 mg total) by mouth daily. 30 tablet 6  . olopatadine (PATANOL) 0.1 % ophthalmic solution Place 1 drop into both eyes 2 (two) times daily. 5 mL 2  . pantoprazole (PROTONIX) 40 MG tablet Take 1 tablet (40 mg total) by mouth daily. 30 tablet 6  . promethazine (PHENERGAN) 25 MG tablet Take 1 tablet (25 mg total) by mouth every 8 (eight) hours as needed for nausea or vomiting. 20 tablet 0  . SUMAtriptan (IMITREX) 25 MG tablet Take 1 tablet (25 mg total) by mouth once for 1 dose. Please take 1 tablet at outset of headache.May repeat in 2 hours if headache persists or recurs. 20 tablet 1  . atorvastatin (LIPITOR) 40 MG tablet Take 1 tablet (40 mg total) by mouth daily. 30 tablet 6  . carvedilol (COREG)  12.5 MG tablet Take 1 tablet (12.5 mg total) by mouth 2 (two) times daily with a meal. 60 tablet 6  . glipiZIDE (GLUCOTROL) 10 MG tablet Take 1 tablet (10 mg total) by mouth 2 (two) times daily before a meal. 60 tablet 6  . Insulin Glargine (LANTUS SOLOSTAR) 100 UNIT/ML Solostar Pen Inject 23 Units into the skin 2 (two) times daily. 30 mL 6  . Insulin Pen Needle (TRUEPLUS 5-BEVEL PEN NEEDLES)  32G X 4 MM MISC Use to inject Lantus daily. 100 each 11  . lisinopril (ZESTRIL) 10 MG tablet Take 1 tablet (10 mg total) by mouth daily. 30 tablet 6  . traZODone (DESYREL) 50 MG tablet Take 1 tablet (50 mg total) by mouth at bedtime as needed for sleep. 30 tablet 6  . acetaminophen (TYLENOL) 500 MG tablet Take 1 tablet (500 mg total) by mouth every 6 (six) hours as needed. (Patient not taking: Reported on 02/11/2020) 30 tablet 0  . albuterol (PROVENTIL HFA;VENTOLIN HFA) 108 (90 Base) MCG/ACT inhaler Inhale 1-2 puffs into the lungs every 6 (six) hours as needed for wheezing or shortness of breath. (Patient not taking: Reported on 02/11/2020) 54 g 3  . diclofenac Sodium (VOLTAREN) 1 % GEL Apply 2g QID x 7 days to affected area in left hip for pain (Patient not taking: Reported on 02/11/2020) 150 g 0  . ibuprofen (ADVIL) 600 MG tablet Take 1 tablet (600 mg total) by mouth every 6 (six) hours as needed. (Patient not taking: Reported on 02/11/2020) 30 tablet 0  . predniSONE (DELTASONE) 20 MG tablet 3 tabs po day one, then 2 tabs daily x 4 days (Patient not taking: Reported on 02/11/2020) 11 tablet 0  . topiramate (TOPAMAX) 50 MG tablet Take 1 tablet (50 mg total) by mouth 2 (two) times daily. (Patient not taking: Reported on 02/11/2020) 60 tablet 3   Facility-Administered Medications Prior to Visit  Medication Dose Route Frequency Provider Last Rate Last Admin  . 0.9 %  sodium chloride infusion  500 mL Intravenous Continuous Gatha Mayer, MD        Allergies  Allergen Reactions  . Gadolinium Derivatives Nausea And  Vomiting    Code: VOM, Desc: Pt began vomiting 45 sec after MRI contrast injection of Multihance, Onset Date: [id number]    . Iodinated Diagnostic Agents     ROS Review of Systems  Constitutional: Negative.   HENT: Negative.   Eyes: Negative.   Respiratory: Negative.   Cardiovascular: Negative.   Gastrointestinal: Negative.   Endocrine: Negative.  Negative for polydipsia and polyuria.  Genitourinary: Negative.   Musculoskeletal: Positive for back pain and gait problem.  Skin: Negative.   Allergic/Immunologic: Negative.   Neurological: Negative for headaches.  Hematological: Negative.   Psychiatric/Behavioral: Positive for sleep disturbance. The patient is nervous/anxious.       Objective:    Physical Exam  Constitutional: She is oriented to person, place, and time. She appears well-developed and well-nourished. No distress.  HENT:  Head: Normocephalic and atraumatic.  Right Ear: External ear normal.  Left Ear: External ear normal.  Nose: Nose normal.  Mouth/Throat: Oropharynx is clear and moist.  Eyes: Pupils are equal, round, and reactive to light. Conjunctivae and EOM are normal.  Cardiovascular: Normal rate, regular rhythm, normal heart sounds and intact distal pulses.  Pulmonary/Chest: Effort normal and breath sounds normal.  Abdominal: Soft. Bowel sounds are normal.  Musculoskeletal:     Cervical back: Normal range of motion and neck supple.     Left hip: Tenderness present. No crepitus. Decreased range of motion.  Neurological: She is alert and oriented to person, place, and time.  Skin: Skin is warm and dry.  Psychiatric: She has a normal mood and affect. Her behavior is normal. Judgment and thought content normal.    BP 134/82   Pulse 61   Temp 98.1 F (36.7 C)   Resp 16   Ht 5' (1.524 m)   Wt 164  lb (74.4 kg)   SpO2 98%   BMI 32.03 kg/m  Wt Readings from Last 3 Encounters:  02/11/20 164 lb (74.4 kg)  11/12/19 162 lb 8 oz (73.7 kg)  10/29/19 162 lb  (73.5 kg)     Health Maintenance Due  Topic Date Due  . PNEUMOCOCCAL POLYSACCHARIDE VACCINE AGE 15-64 HIGH RISK  Never done  . OPHTHALMOLOGY EXAM  Never done  . INFLUENZA VACCINE  06/22/2019  . FOOT EXAM  08/16/2019  . MAMMOGRAM  01/26/2020    There are no preventive care reminders to display for this patient.  Lab Results  Component Value Date   TSH 1.500 02/11/2020   Lab Results  Component Value Date   WBC 8.0 01/18/2020   HGB 15.1 (H) 01/18/2020   HCT 43.7 01/18/2020   MCV 87.4 01/18/2020   PLT  01/18/2020    PLATELET CLUMPS NOTED ON SMEAR, COUNT APPEARS ADEQUATE   Lab Results  Component Value Date   NA 141 01/18/2020   K 3.9 01/18/2020   CHLORIDE 104 09/30/2013   CO2 24 01/18/2020   GLUCOSE 121 (H) 01/18/2020   BUN 11 01/18/2020   CREATININE 0.64 01/18/2020   BILITOT 1.2 10/29/2019   ALKPHOS 88 10/29/2019   AST 24 10/29/2019   ALT 25 10/29/2019   PROT 7.0 10/29/2019   ALBUMIN 4.0 10/29/2019   CALCIUM 9.4 01/18/2020   ANIONGAP 10 01/18/2020   GFR 84.64 10/29/2019   Lab Results  Component Value Date   CHOL 106 02/11/2020   Lab Results  Component Value Date   HDL 47 02/11/2020   Lab Results  Component Value Date   LDLCALC 37 02/11/2020   Lab Results  Component Value Date   TRIG 125 02/11/2020   Lab Results  Component Value Date   CHOLHDL 2.3 02/11/2020   Lab Results  Component Value Date   HGBA1C 7.6 (A) 10/01/2019      Assessment & Plan:   Problem List Items Addressed This Visit      Cardiovascular and Mediastinum   HTN (hypertension)   Relevant Medications   atorvastatin (LIPITOR) 40 MG tablet   carvedilol (COREG) 12.5 MG tablet   lisinopril (ZESTRIL) 10 MG tablet   TIA (transient ischemic attack)   Relevant Medications   atorvastatin (LIPITOR) 40 MG tablet   carvedilol (COREG) 12.5 MG tablet   lisinopril (ZESTRIL) 10 MG tablet     Digestive   GERD (gastroesophageal reflux disease)     Endocrine   Diabetes mellitus (HCC) -  Primary   Relevant Medications   atorvastatin (LIPITOR) 40 MG tablet   glipiZIDE (GLUCOTROL) 10 MG tablet   insulin glargine (LANTUS SOLOSTAR) 100 UNIT/ML Solostar Pen   Insulin Pen Needle (TRUEPLUS 5-BEVEL PEN NEEDLES) 32G X 4 MM MISC   lisinopril (ZESTRIL) 10 MG tablet   Other Relevant Orders   TSH (Completed)   Vitamin D, 25-hydroxy (Completed)   Microalbumin / creatinine urine ratio (Completed)   Glucose (CBG) (Completed)     Other   Hyperlipidemia   Relevant Medications   atorvastatin (LIPITOR) 40 MG tablet   carvedilol (COREG) 12.5 MG tablet   lisinopril (ZESTRIL) 10 MG tablet   Other Relevant Orders   Lipid panel (Completed)    Other Visit Diagnoses    Insomnia, unspecified type       Relevant Medications   traZODone (DESYREL) 100 MG tablet   Hyperglycemia       Relevant Medications   insulin aspart (novoLOG) injection 8 Units (  Completed)   Encounter for diabetic foot exam ([place])       Relevant Medications   insulin aspart (novoLOG) injection 8 Units (Completed)   atorvastatin (LIPITOR) 40 MG tablet   glipiZIDE (GLUCOTROL) 10 MG tablet   insulin glargine (LANTUS SOLOSTAR) 100 UNIT/ML Solostar Pen   lisinopril (ZESTRIL) 10 MG tablet      Meds ordered this encounter  Medications  . insulin aspart (novoLOG) injection 8 Units  . atorvastatin (LIPITOR) 40 MG tablet    Sig: Take 1 tablet (40 mg total) by mouth daily.    Dispense:  30 tablet    Refill:  6    Order Specific Question:   Supervising Provider    Answer:   Asencion Noble E [[id number]]  . carvedilol (COREG) 12.5 MG tablet    Sig: Take 1 tablet (12.5 mg total) by mouth 2 (two) times daily with a meal.    Dispense:  60 tablet    Refill:  6    Order Specific Question:   Supervising Provider    Answer:   Asencion Noble E [[id number]]  . glipiZIDE (GLUCOTROL) 10 MG tablet    Sig: Take 1 tablet (10 mg total) by mouth 2 (two) times daily before a meal.    Dispense:  60 tablet    Refill:  6    Order Specific  Question:   Supervising Provider    Answer:   Asencion Noble E [[id number]]  . insulin glargine (LANTUS SOLOSTAR) 100 UNIT/ML Solostar Pen    Sig: Inject 28 Units into the skin 2 (two) times daily.    Dispense:  30 mL    Refill:  6    Dose change    Order Specific Question:   Supervising Provider    Answer:   Asencion Noble E [[id number]]  . Insulin Pen Needle (TRUEPLUS 5-BEVEL PEN NEEDLES) 32G X 4 MM MISC    Sig: Use to inject Lantus daily.    Dispense:  100 each    Refill:  11    Order Specific Question:   Supervising Provider    Answer:   Asencion Noble E [[id number]]  . lisinopril (ZESTRIL) 10 MG tablet    Sig: Take 1 tablet (10 mg total) by mouth daily.    Dispense:  30 tablet    Refill:  6    Order Specific Question:   Supervising Provider    Answer:   Asencion Noble E [[id number]]  . traZODone (DESYREL) 100 MG tablet    Sig: Take 1 tablet (100 mg total) by mouth at bedtime as needed for sleep.    Dispense:  30 tablet    Refill:  3    Order Specific Question:   Supervising Provider    Answer:   Joya Gaskins, PATRICK E [[id number]]  1. Type 2 diabetes mellitus with diabetic neuropathy, with long-term current use of insulin ([place]) Patient is unable to tolerate metformin, had adverse effect of loose stools.  Increase lantus 28 units BID, keep BG log and food diary and bring on next OV, f/u with clinical pharmacist for further medication adjustments.  - TSH - Vitamin D, 25-hydroxy - Microalbumin / creatinine urine ratio - glipiZIDE (GLUCOTROL) 10 MG tablet; Take 1 tablet (10 mg total) by mouth 2 (two) times daily before a meal.  Dispense: 60 tablet; Refill: 6 - insulin glargine (LANTUS SOLOSTAR) 100 UNIT/ML Solostar Pen; Inject 28 Units into the skin 2 (two) times daily.  Dispense: 30 mL; Refill: 6 - Insulin Pen Needle (TRUEPLUS  5-BEVEL PEN NEEDLES) 32G X 4 MM MISC; Use to inject Lantus daily.  Dispense: 100 each; Refill: 11 - Glucose (CBG)  2. Insomnia, unspecified type Trial increased dose 100 mg, gave  patient education on good sleep hygiene  - traZODone (DESYREL) 100 MG tablet; Take 1 tablet (100 mg total) by mouth at bedtime as needed for sleep.  Dispense: 30 tablet; Refill: 3  3. Essential hypertension Continue current regimen - carvedilol (COREG) 12.5 MG tablet; Take 1 tablet (12.5 mg total) by mouth 2 (two) times daily with a meal.  Dispense: 60 tablet; Refill: 6 - lisinopril (ZESTRIL) 10 MG tablet; Take 1 tablet (10 mg total) by mouth daily.  Dispense: 30 tablet; Refill: 6  4. Pure hypercholesterolemia The current medical regimen is effective;  continue present plan and medications. - Lipid panel - atorvastatin (LIPITOR) 40 MG tablet; Take 1 tablet (40 mg total) by mouth daily.  Dispense: 30 tablet; Refill: 6  5. Gastroesophageal reflux disease without esophagitis Well controlled with protonix, does not take daily  6. TIA (transient ischemic attack) History, recent CT scan, no elevated B readings, no report of headaches  7. Hyperglycemia Patient did not take medications yet today. Gave patient education on benefits of keeping a regular medication schedule - insulin aspart (novoLOG) injection 8 Units  8. Encounter for diabetic foot exam ([place]) We discussed routine foot inspection at home, keeping nails trim,  wearing supportive shoes, avoiding dry skin and avoiding going barefoot.  9. Left Hip Pain I reviewed the MRI from the ED visit on 01/18/20; patient did state the steroid injection offered temporary relief.  Patient is in process of applying for financial assistance, reviewed MRI with patient and encouraged to cont with application, unable to afford referral to neurosurgery at this time   IMPRESSION: L5-S1: There is a disc bulge more prominent towards the left. There is mild facet and ligamentous prominence. There is mild narrowing of the subarticular lateral recess on the left. Compression of the left S1 nerve is not demonstrated, but that structure could possibly  be irritated in this location.  L4-5: Mild bulging of the disc. Mild facet prominence. Mild narrowing of the lateral recesses left more than right but no visible neural compression  I have reviewed the patient's medical history (PMH, PSH, Social History, Family History, Medications, and allergies) , and have been updated if relevant. I spent 30 minutes reviewing chart and non face to face time with patient.     Follow-up: Return in about 4 weeks (around 03/10/2020) for follow up with Clinical Pharmacist for BG log review.    Loraine Grip Mayers, PA-C

## 2020-02-11 NOTE — Progress Notes (Signed)
C /o unable to control her sugar   States recording High sugar readings 300-400  CBG 281 A1C 8.4

## 2020-02-11 NOTE — Patient Instructions (Signed)
We are increasing your Lantus to 28 units twice daily, I would like you to keep a log of your blood sugars, follow-up in 1 month with our clinical pharmacist to discussed the trend of your blood sugars to see if we need to further adjust your medications.  It would also be very helpful to keep a food diary over this next month as well.  Please bring food diary and blood sugar log to that visit   Control de la glucemia, en adultos Blood Glucose Monitoring, Adult Controlar el nivel de azcar en la sangre (glucosa) es una parte importante del tratamiento de su diabetes (diabetes mellitus). El control de la glucemia implica realizar controles regulares segn le indiquen, y Catering manager un registro de los resultados (registro diario) a lo largo del [place]. Controlar la glucemia en forma peridica y llevar un registro diario puede:  Ayudarlos a usted y al mdico a Programmer, applications de control de la diabetes segn sea necesario, lo que incluye medicamentos o insulina.  Ayudarlo a usted a comprender de [employer], la actividad fsica, las enfermedades y los medicamentos inciden en la glucemia.  Permitirle a usted saber cul es su nivel de glucemia en cualquier momento. Averiguar rpidamente si su nivel de glucemia es bajo (hipoglucemia) o alto (hiperglucemia). El [employer] objetivos personalizados de su tratamiento. Los objetivos estarn basados en su edad, en otras enfermedades que tenga y en cmo responde al tratamiento de la diabetes. Generalmente, el objetivo del tratamiento es [employer] siguientes niveles de glucemia:  Antes de las comidas (preprandial): de 80 a 130mg /dl (4,4 a 7,85mmol/l).  Despus de las comidas (posprandial): por debajo de 180mg /dl (64mmol/l).  Nivel de A1c: menos del 7%. Materiales necesarios:  Medidor de glucemia.  Tiras reactivas para el medidor. Cada medidor tiene sus propias tiras reactivas. Debe usar las tiras reactivas que trajo su  medidor.  Una aguja para pincharse el dedo (lanceta). No use una lanceta ms de una vez.  Un dispositivo que sujeta la lanceta (dispositivo de puncin).  Un diario o cuaderno de anotaciones para Pensions consultant. Cmo controlar su glucemia  1. Lvese las manos con agua y [place]. 2. Pnchese el costado del dedo (no en la punta) con la lanceta. Use un dedo diferente cada vez. 3. Frote suavemente el dedo hasta que aparezca una pequea gota de [place]. 4. Siga las instrucciones que vienen con el medidor para Garment/textile technologist tira Comptroller, Midwife la sangre sobre la tira y usar el medidor de glucemia. 5. Registre su resultado y las observaciones que desee. Algunos medidores le permiten tomar sangre para la prueba de otras zonas del cuerpo que no son el dedo (zonas alternativas). Las zonas alternativas ms comunes son las siguientes:  Los Field seismologist.  Los muslos.  La palma de la mano. Si cree que puede tener hipoglucemia, o si tiene antecedentes de no saber cundo le est bajando el nivel de glucemia (hipoglucemia asintomtica), no use zonas alternativas. En su lugar, use los dedos. Es posible que las zonas alternativas no sean tan precisas como los dedos porque el flujo de sangre es ms lento en esas zonas. Esto significa que el resultado que obtiene de estas zonas puede estar retrasado y ser un poco diferente del resultado que obtendra de su dedo. Siga estas indicaciones en su casa: Registro diario de glucemia   Cada vez que controle su nivel de glucemia, anote el resultado. Tambin anote aquellos factores que puedan estar afectando su nivel de glucemia,  como la dieta y la actividad fsica Designer, multimedia. Esta informacin puede ayudarlos a usted y a su mdico a: ? Charity fundraiser patrones en su glucemia durante el transcurso del [place]. ? Ajustar su plan de control de la diabetes segn sea necesario.  Averige si su Research officer, trade union sus registros en una computadora. La [employer] de  los medidores de glucosa guardan un registro de las lecturas realizadas con el medidor. Si usted tiene diabetes tipo 1:  Controle su nivel de glucemia 2 o ms veces al da.  Tambin controle su nivel de glucemia: ? Antes de cada inyeccin de insulina. ? Antes y despus de hacer ejercicio. ? Antes de comer. ? 2 horas despus de una comida. ? Ocasionalmente, entre las 2:00a.m. y las 3:00a.m., como se lo hayan indicado. ? Antes de Optometrist tareas peligrosas, English as a second language teacher o usar maquinaria pesada. ? A la hora de acostarse.  Es posible que Geophysicist/field seismologist con ms frecuencia sus niveles de glucemia, hasta 6 a 10veces por da, si: ? Usted [place] una bomba de insulina. ? Usted necesita mltiples inyecciones diarias (MDI, por sus siglas en ingls). ? Tiene diabetes que no est bien controlada. ? Usted est enfermo. ? Usted tiene antecedentes de hipoglucemia grave. ? Usted tiene hipoglucemia asintomtica. Si usted tiene diabetes tipo 2:  Si recibe insulina u otros medicamentos para la diabetes, controle su nivel de glucemia 2 o ms veces al Training and development officer.  Si recibe tratamiento intensivo con insulina, debe medirse el nivel de glucemia 4o ms veces al [employer]. Ocasionalmente, es posible que deba controlarlo entre las 2:00a.m. y las 3:00a.m., segn se lo indiquen.  Tambin controle su nivel de glucemia: ? Antes y despus de hacer ejercicio. ? Antes de Optometrist tareas peligrosas, English as a second language teacher o usar maquinaria pesada.  Es posible que deba controlar con ms frecuencia su nivel de glucemia si: ? Necesita ajustar la dosis de sus medicamentos. ? Su diabetes no est bien controlada. ? Est enfermo. Consejos generales  Siempre tenga sus insumos a mano.  Todos los medidores de glucemia incluyen un nmero de telfono "directo", disponible las 24 horas, al que podr llamar si tiene preguntas o [place]. Tambin puede consultar a su mdico.  Despus de usar algunas cajas de tiras reactivas, ajuste  (calibre) el medidor de glucemia segn las instrucciones del medidor. Comunquese con un mdico si:  El nivel de glucemia es mayor o igual que 240mg /dl (13,13mmol/dl) durante 2das seguidos.  Ha estado enfermo o ha tenido fiebre durante ms de 2das y no mejora.  Tiene alguno de los siguientes problemas durante ms de 6horas: ? No puede comer ni beber. ? Tiene nuseas o vmitos. ? Tiene diarrea. Solicite ayuda de inmediato si:  Su nivel de glucemia est por debajo de 54mg /dl (4mmol/l).  Se siente confundido o tiene dificultad para pensar con claridad.  Tiene dificultad para respirar.  Tiene un nivel moderado o alto de cetonas en la [place]. Resumen  Controlar el nivel de azcar en la sangre (glucosa) es una parte importante del tratamiento de su diabetes (diabetes mellitus).  El control de la glucemia implica realizar controles regulares segn le indiquen, y Catering manager un registro de los resultados (registro diario) a lo largo del [place].  El [employer] objetivos personalizados de su tratamiento. Los objetivos estarn basados en su edad, en otras enfermedades que tenga y en cmo responde al tratamiento de la diabetes.  Cada vez que controle su nivel de glucemia, anote el resultado. Tambin anote aquellos  factores que puedan estar afectando su nivel de glucemia, como la dieta y la actividad fsica Designer, multimedia. Esta informacin no tiene Marine scientist el consejo del mdico. Asegrese de hacerle al mdico cualquier pregunta que tenga. Document Revised: 02/16/2018 Document Reviewed: 04/18/2016 Elsevier Patient Education  [place].

## 2020-02-12 LAB — LIPID PANEL
Chol/HDL Ratio: 2.3 ratio (ref 0.0–4.4)
Cholesterol, Total: 106 mg/dL (ref 100–199)
HDL: 47 mg/dL (ref >39)
LDL Chol Calc (NIH): 37 mg/dL (ref 0–99)
Triglycerides: 125 mg/dL (ref 0–149)
VLDL Cholesterol Cal: 22 mg/dL (ref 5–40)

## 2020-02-12 LAB — POCT GLYCOSYLATED HEMOGLOBIN (HGB A1C): Hemoglobin A1C: 8.4 % — AB (ref 4.0–5.6)

## 2020-02-12 LAB — MICROALBUMIN / CREATININE URINE RATIO
Creatinine, Urine: 43.9 mg/dL
Microalb/Creat Ratio: 15 mg/g creat (ref 0–29)
Microalbumin, Urine: 6.5 ug/mL

## 2020-02-12 LAB — TSH: TSH: 1.5 u[IU]/mL (ref 0.450–4.500)

## 2020-02-12 LAB — VITAMIN D 25 HYDROXY (VIT D DEFICIENCY, FRACTURES): Vit D, 25-Hydroxy: 30 ng/mL (ref 30.0–100.0)

## 2020-02-12 NOTE — Addendum Note (Signed)
Addended by: Bonnita Nasuti C on: 02/12/2020 03:26 PM   Modules accepted: Orders

## 2020-03-12 MED FILL — LISINOPRIL 10 MG TABS: 10 | 30 days supply | Qty: 30 | Fill #4

## 2020-03-12 MED FILL — ?ATORVASTATIN 40MG TABLET: 40 | 30 days supply | Qty: 30 | Fill #1

## 2020-03-12 MED FILL — LANTUS SOLOSTAR 100 UNITS/M: 100 | 32 days supply | Qty: 15 | Fill #1

## 2020-03-12 MED FILL — ?GLIPIZIDE 10 MG TABLET: 10 | 30 days supply | Qty: 60 | Fill #5

## 2020-03-12 MED FILL — TRUEPLUS PEN NDL 32GX5/32: 32G X 4 MM | 30 days supply | Qty: 100 | Fill #2

## 2020-03-12 MED FILL — ?CARVEDILOL 12.5 MG TABLET: 12.5 | 30 days supply | Qty: 60 | Fill #4

## 2020-04-13 MED FILL — LISINOPRIL 10 MG TABS: 10 | 30 days supply | Qty: 30 | Fill #5

## 2020-04-13 MED FILL — ?CARVEDILOL 12.5 MG TABLET: 12.5 | 30 days supply | Qty: 60 | Fill #5

## 2020-04-13 MED FILL — ?GLIPIZIDE 10 MG TABLET: 10 | 30 days supply | Qty: 60 | Fill #6

## 2020-05-15 MED FILL — ?BASAGLAR 100 UNITS/ML KWPE: 100 | 32 days supply | Qty: 15 | Fill #2

## 2020-05-15 MED FILL — ?CARVEDILOL 12.5 MG TABLET: 12.5 | 30 days supply | Qty: 60 | Fill #6

## 2020-05-15 MED FILL — ?GLIPIZIDE 10 MG TABLET: 10 | 30 days supply | Qty: 60 | Fill #0

## 2020-05-15 MED FILL — LISINOPRIL 10 MG TABS: 10 | 30 days supply | Qty: 30 | Fill #6

## 2020-05-21 ENCOUNTER — Telehealth: Payer: Self-pay

## 2020-05-21 NOTE — Telephone Encounter (Signed)
I am getting Brandi Bryant , [facility] to help in making her an appointment for her shot.

## 2020-05-21 NOTE — Telephone Encounter (Signed)
-----   Message from Jahari Billy E [place], [place] sent at 01/02/2020  1:10 PM EST ----- She is spanish speaking and due for her 3rd Hep B pediatric dose vaccine. Engerix B brand. Call and set up for 06/01/2020 . First shot was 12/02/2019, Carlean Purl patient.

## 2020-05-27 NOTE — Telephone Encounter (Signed)
Brandi Bryant has tried to reach her and her voice mail has not been set up. She is going to continue to try.

## 2020-05-28 ENCOUNTER — Encounter: Payer: Self-pay | Admitting: Internal Medicine

## 2020-06-12 NOTE — Telephone Encounter (Signed)
A letter has been mailed to the patient on 05/28/2020 for her to contact us.

## 2020-06-15 ENCOUNTER — Ambulatory Visit: Payer: No Typology Code available for payment source | Admitting: Family Medicine

## 2020-06-19 MED FILL — ?GLIPIZIDE 10 MG TABLET: 10 | 30 days supply | Qty: 60 | Fill #1

## 2020-06-19 MED FILL — ?CARVEDILOL 12.5 MG TABLET: 12.5 | 30 days supply | Qty: 60 | Fill #0

## 2020-06-19 MED FILL — LISINOPRIL 10 MG TABS: 10 | 30 days supply | Qty: 30 | Fill #0

## 2020-07-22 MED FILL — $LANTUS SOLOSTAR 100 UNITS/: 100 | 97 days supply | Qty: 45 | Fill #3

## 2020-07-22 MED FILL — ?GLIPIZIDE 10 MG TABLET: 10 | 30 days supply | Qty: 60 | Fill #2

## 2020-07-22 MED FILL — ?CARVEDILOL 12.5 MG TABLET: 12.5 | 30 days supply | Qty: 60 | Fill #1

## 2020-07-22 MED FILL — LISINOPRIL 10 MG TABS: 10 | 30 days supply | Qty: 30 | Fill #1

## 2020-08-25 MED FILL — CARVEDILOL 12.5 MG TABLET: 12.5 | 30 days supply | Qty: 60 | Fill #2

## 2020-08-25 MED FILL — LISINOPRIL 10 MG TABS: 10 | 30 days supply | Qty: 30 | Fill #2

## 2020-08-25 MED FILL — ?GLIPIZIDE 10 MG TABLET: 10 | 30 days supply | Qty: 60 | Fill #3

## 2020-08-25 MED FILL — ?ATORVASTATIN 40MG TABLET: 40 | 30 days supply | Qty: 30 | Fill #2

## 2020-09-08 ENCOUNTER — Other Ambulatory Visit: Payer: Self-pay

## 2020-09-08 ENCOUNTER — Emergency Department (HOSPITAL_COMMUNITY)
Admission: EM | Admit: 2020-09-08 | Discharge: 2020-09-09 | Disposition: A | Discharge status: Home / Self Care | Payer: Self-pay | Attending: Emergency Medicine | Admitting: Emergency Medicine

## 2020-09-08 ENCOUNTER — Encounter (HOSPITAL_COMMUNITY): Payer: Self-pay | Admitting: Emergency Medicine

## 2020-09-08 DIAGNOSIS — I2510 Atherosclerotic heart disease of native coronary artery without angina pectoris: Secondary | ICD-10-CM | POA: Insufficient documentation

## 2020-09-08 DIAGNOSIS — R739 Hyperglycemia, unspecified: Secondary | ICD-10-CM

## 2020-09-08 DIAGNOSIS — Z853 Personal history of malignant neoplasm of breast: Secondary | ICD-10-CM | POA: Insufficient documentation

## 2020-09-08 DIAGNOSIS — I10 Essential (primary) hypertension: Secondary | ICD-10-CM | POA: Insufficient documentation

## 2020-09-08 DIAGNOSIS — E1142 Type 2 diabetes mellitus with diabetic polyneuropathy: Secondary | ICD-10-CM | POA: Insufficient documentation

## 2020-09-08 DIAGNOSIS — Z7982 Long term (current) use of aspirin: Secondary | ICD-10-CM | POA: Insufficient documentation

## 2020-09-08 DIAGNOSIS — Z7984 Long term (current) use of oral hypoglycemic drugs: Secondary | ICD-10-CM | POA: Insufficient documentation

## 2020-09-08 DIAGNOSIS — Z794 Long term (current) use of insulin: Secondary | ICD-10-CM | POA: Insufficient documentation

## 2020-09-08 DIAGNOSIS — E1165 Type 2 diabetes mellitus with hyperglycemia: Secondary | ICD-10-CM | POA: Insufficient documentation

## 2020-09-08 DIAGNOSIS — R1084 Generalized abdominal pain: Secondary | ICD-10-CM | POA: Insufficient documentation

## 2020-09-08 DIAGNOSIS — Z8673 Personal history of transient ischemic attack (TIA), and cerebral infarction without residual deficits: Secondary | ICD-10-CM | POA: Insufficient documentation

## 2020-09-08 DIAGNOSIS — Z79899 Other long term (current) drug therapy: Secondary | ICD-10-CM | POA: Insufficient documentation

## 2020-09-08 LAB — CBC
HCT: 41.4 % (ref 36.0–46.0)
Hemoglobin: 13.9 g/dL (ref 12.0–15.0)
MCH: 29.8 pg (ref 26.0–34.0)
MCHC: 33.6 g/dL (ref 30.0–36.0)
MCV: 88.8 fL (ref 80.0–100.0)
Platelets: 153 10*3/uL (ref 150–400)
RBC: 4.66 MIL/uL (ref 3.87–5.11)
RDW: 12.4 % (ref 11.5–15.5)
WBC: 5.6 10*3/uL (ref 4.0–10.5)
nRBC: 0 % (ref 0.0–0.2)

## 2020-09-08 LAB — URINALYSIS, ROUTINE W REFLEX MICROSCOPIC
Bilirubin Urine: NEGATIVE
Glucose, UA: 50 mg/dL — AB
Hgb urine dipstick: NEGATIVE
Ketones, ur: NEGATIVE mg/dL
Leukocytes,Ua: NEGATIVE
Nitrite: NEGATIVE
Protein, ur: NEGATIVE mg/dL
Specific Gravity, Urine: 1.011 (ref 1.005–1.030)
pH: 5 (ref 5.0–8.0)

## 2020-09-08 LAB — BASIC METABOLIC PANEL
Anion gap: 9 (ref 5–15)
BUN: 10 mg/dL (ref 8–23)
CO2: 24 mmol/L (ref 22–32)
Calcium: 9.3 mg/dL (ref 8.9–10.3)
Chloride: 104 mmol/L (ref 98–111)
Creatinine, Ser: 0.68 mg/dL (ref 0.44–1.00)
GFR, Estimated: >60 mL/min (ref >60)
Glucose, Bld: 258 mg/dL — ABNORMAL HIGH (ref 70–99)
Potassium: 4.4 mmol/L (ref 3.5–5.1)
Sodium: 137 mmol/L (ref 135–145)

## 2020-09-08 NOTE — ED Triage Notes (Addendum)
Patient arrives to ED with complaints of hyperglycemia. Pt states when she took her BS this morning it was 370, she took her insulin and later took her BS and it was 480. Pt is here because she is worried that her BS is getting too high when she tries to control it and eat healthy. Pt states recent blurry vision for while and loss of sensation to lower legs.

## 2020-09-09 ENCOUNTER — Emergency Department (HOSPITAL_COMMUNITY): Payer: Self-pay

## 2020-09-09 LAB — HEPATIC FUNCTION PANEL
ALT: 41 U/L (ref 0–44)
AST: 61 U/L — ABNORMAL HIGH (ref 15–41)
Albumin: 3.5 g/dL (ref 3.5–5.0)
Alkaline Phosphatase: 101 U/L (ref 38–126)
Bilirubin, Direct: 0.7 mg/dL — ABNORMAL HIGH (ref 0.0–0.2)
Indirect Bilirubin: 1.1 mg/dL — ABNORMAL HIGH (ref 0.3–0.9)
Total Bilirubin: 1.8 mg/dL — ABNORMAL HIGH (ref 0.3–1.2)
Total Protein: 6.7 g/dL (ref 6.5–8.1)

## 2020-09-09 LAB — LIPASE, BLOOD: Lipase: 39 U/L (ref 11–51)

## 2020-09-09 LAB — CBG MONITORING, ED: Glucose-Capillary: 203 mg/dL — ABNORMAL HIGH (ref 70–99)

## 2020-09-09 MED ORDER — ONDANSETRON HCL 4 MG/2ML IJ SOLN
4.0000 mg | Freq: Once | INTRAMUSCULAR | Status: AC
  Administered 2020-09-09: 4 mg via INTRAVENOUS
  Filled 2020-09-09: qty 2

## 2020-09-09 MED ORDER — MORPHINE SULFATE (PF) 4 MG/ML IV SOLN
4.0000 mg | Freq: Once | INTRAVENOUS | Status: AC
  Administered 2020-09-09: 4 mg via INTRAVENOUS
  Filled 2020-09-09: qty 1

## 2020-09-09 MED ORDER — ACETAMINOPHEN 500 MG PO TABS
1000.0000 mg | ORAL_TABLET | Freq: Once | ORAL | Status: AC
  Administered 2020-09-09: 1000 mg via ORAL
  Filled 2020-09-09: qty 2

## 2020-09-09 NOTE — ED Notes (Signed)
Interpretor utilized to go over d/c instructions, Patient verbalized understanding of dc instructions, vss, ambulatory with nad. pts daughter to pick her up upon dc.

## 2020-09-09 NOTE — ED Provider Notes (Signed)
[facility] EMERGENCY DEPARTMENT Provider Note   CSN: [id number] Arrival date & time: 09/08/20  1416     History Chief Complaint  Patient presents with  . Hyperglycemia    Brandi Bryant is a 64 y.o. female.  Patient presents to the emergency department with a chief complaint of multiple complaints.   #1.  Hyperglycemia: Patient states that her blood sugar has been running high for the past several weeks.  She reports that it was as high as the upper 400s today.  She states that she has been using her medication as prescribed.  She reports that she has been trying to also control her sugar with her diet and exercise.    #2.  Abdominal pain: Patient complains of right-sided abdominal pain and nausea and vomiting x1 week.  She does have history of cholecystectomy.  She states that her symptoms are worsened with palpation.  She denies any associated fever or chills.  3.  Headache: Patient complains of headache that started while in the waiting room.  No treatments prior to arrival.  The history is provided by the patient. No language interpreter was used.       Past Medical History:  Diagnosis Date  . Abdominal pain   . Allergy   . Arthralgia 08/13/2013  . Bell palsy 2006  . Bell's palsy   . Breast cancer ([place]) 2009   Left Breast Cancer  . Cataract   . Chest pain 09/30/2013  . Cholelithiasis   . Colon cancer screening 10/27/2015  . Complication of anesthesia    " tubal ligation, in [place]"  had weakness, couldnt stand up the next day, was given pills for oxygen for Brain."  1 month after I was having loss of attentiviness, still have to focus  carefully.   . Coronary artery disease   . Depression   . Diabetes mellitus    Type II  . Dyspnea    at times- when air conditioner is runnimg, heat also   . Encounter for screening colonoscopy 10/30/2015  . GERD (gastroesophageal reflux disease)   . Hepatic steatosis   . History of breast cancer  06/01/2012  . HTN (hypertension)   . Hx of adenomatous polyp of colon 11/07/2019  . Hyperlipidemia   . Personal history of chemotherapy 2009   Left Breast Cancer  . Personal history of radiation therapy 2009   Left Breast Cancer  . Stroke [facility])    2006, 2015    Patient Active Problem List   Diagnosis Date Noted  . Cirrhosis of liver without ascites ([place]) 11/14/2019  . Kidney lesion, native, left 11/14/2019  . Hx of adenomatous polyp of colon 11/07/2019  . Well woman exam with routine gynecological exam 08/06/2019  . Breast pain, right 08/06/2019  . Internal carotid artery stenosis 04/03/2018  . CVA (cerebral vascular accident) ([place]) 03/30/2018  . GERD (gastroesophageal reflux disease) 11/27/2017  . Chronic sinusitis 05/16/2017  . Diabetic polyneuropathy associated with type 2 diabetes mellitus ([place]) 10/31/2016  . Impingement syndrome of right ankle 10/31/2016  . Symptomatic cholelithiasis 12/09/2015  . Palpitations 08/28/2014  . Preventive measure 08/21/2014  . Intractable headache 02/21/2014  . TIA (transient ischemic attack) 02/20/2014  . Trigeminal neuralgia 01/22/2014  . Weakness 01/22/2014  . Bell's palsy 01/21/2014  . UTI (urinary tract infection) 01/21/2014  . NAFLD (nonalcoholic fatty liver disease) 01/21/2014  . Headache 01/20/2014  . Diabetes type 2, uncontrolled ([place]) 01/20/2014  . Facial droop 01/15/2014  . Stroke ([place]) 01/15/2014  .  Type 2 diabetes mellitus with diabetic neuropathy ([place]) 12/06/2013  . Hyperlipidemia 11/07/2013  . CAD (coronary artery disease), native coronary artery 11/07/2013  . Chest pain 09/30/2013  . Arthralgia 08/13/2013  . History of breast cancer 06/01/2012  . Abdominal pain 01/31/2012  . HTN (hypertension)   . Diabetes mellitus ([place])     Past Surgical History:  Procedure Laterality Date  . ANKLE ARTHROSCOPY Right 12/14/2016   Procedure: Right Ankle Arthroscopic Debridement;  Surgeon: Newt Minion, MD;  Location: [facility];   Service: Orthopedics;  Laterality: Right;  . Arm surgery     Left tendon lengthen  . BREAST LUMPECTOMY Left 2009  . CHOLECYSTECTOMY N/A 12/09/2015   Procedure: LAPAROSCOPIC CHOLECYSTECTOMY WITH INTRAOPERATIVE CHOLANGIOGRAM;  Surgeon: Greer Pickerel, MD;  Location: [facility];  Service: General;  Laterality: N/A;  . LEFT HEART CATHETERIZATION WITH CORONARY ANGIOGRAM N/A 10/30/2013   Procedure: LEFT HEART CATHETERIZATION WITH CORONARY ANGIOGRAM;  Surgeon: Josue Hector, MD;  Location: [facility] CATH LAB;  Service: Cardiovascular;  Laterality: N/A;  . porta cath Right   . Removal of Porta cath Right   . TUBAL LIGATION       OB History    Gravida  6   Para  6   Term  6   Preterm      AB      Living  6     SAB      TAB      Ectopic      Multiple      Live Births              Family History  Problem Relation Age of Onset  . Thyroid cancer Sister   . Hypertension Mother   . Diabetes Mother   . Migraines Daughter   . Colon cancer Neg Hx   . Esophageal cancer Neg Hx   . Stomach cancer Neg Hx   . Rectal cancer Neg Hx     Social History   Tobacco Use  . Smoking status: Never Smoker  . Smokeless tobacco: Never Used  Vaping Use  . Vaping Use: Never used  Substance Use Topics  . Alcohol use: No  . Drug use: No    Home Medications Prior to Admission medications   Medication Sig Start Date End Date Taking? Authorizing Provider  acetaminophen (TYLENOL) 500 MG tablet Take 1 tablet (500 mg total) by mouth every 6 (six) hours as needed. Patient not taking: Reported on 02/11/2020 09/26/19   Chase Picket, MD  albuterol (PROVENTIL HFA;VENTOLIN HFA) 108 (90 Base) MCG/ACT inhaler Inhale 1-2 puffs into the lungs every 6 (six) hours as needed for wheezing or shortness of breath. Patient not taking: Reported on 02/11/2020 04/25/17   Charlott Rakes, MD  aspirin 325 MG EC tablet Take 1 tablet (325 mg total) by mouth daily. 06/14/18   Argentina Donovan, PA-C  atorvastatin (LIPITOR) 40 MG  tablet Take 1 tablet (40 mg total) by mouth daily. 02/11/20   Mayers, Cari S, PA-C  carvedilol (COREG) 12.5 MG tablet Take 1 tablet (12.5 mg total) by mouth 2 (two) times daily with a meal. 02/11/20   Mayers, Cari S, PA-C  diclofenac Sodium (VOLTAREN) 1 % GEL Apply 2g QID x 7 days to affected area in left hip for pain Patient not taking: Reported on 02/11/2020 01/18/20   Domenic Moras, PA-C  glipiZIDE (GLUCOTROL) 10 MG tablet Take 1 tablet (10 mg total) by mouth 2 (two) times daily before a meal. 02/11/20  Mayers, Cari S, PA-C  ibuprofen (ADVIL) 600 MG tablet Take 1 tablet (600 mg total) by mouth every 6 (six) hours as needed. Patient not taking: Reported on 02/11/2020 01/18/20   Domenic Moras, PA-C  insulin glargine (LANTUS SOLOSTAR) 100 UNIT/ML Solostar Pen Inject 28 Units into the skin 2 (two) times daily. 02/11/20   Mayers, Cari S, PA-C  Insulin Pen Needle (TRUEPLUS 5-BEVEL PEN NEEDLES) 32G X 4 MM MISC Use to inject Lantus daily. 02/11/20   Mayers, Cari S, PA-C  lisinopril (ZESTRIL) 10 MG tablet Take 1 tablet (10 mg total) by mouth daily. 02/11/20   Mayers, Cari S, PA-C  olopatadine (PATANOL) 0.1 % ophthalmic solution Place 1 drop into both eyes 2 (two) times daily. 03/26/19   Charlott Rakes, MD  pantoprazole (PROTONIX) 40 MG tablet Take 1 tablet (40 mg total) by mouth daily. 10/01/19   Charlott Rakes, MD  predniSONE (DELTASONE) 20 MG tablet 3 tabs po day one, then 2 tabs daily x 4 days Patient not taking: Reported on 02/11/2020 01/18/20   Domenic Moras, PA-C  promethazine (PHENERGAN) 25 MG tablet Take 1 tablet (25 mg total) by mouth every 8 (eight) hours as needed for nausea or vomiting. 10/30/18   Charlott Rakes, MD  SUMAtriptan (IMITREX) 25 MG tablet Take 1 tablet (25 mg total) by mouth once for 1 dose. Please take 1 tablet at outset of headache.May repeat in 2 hours if headache persists or recurs. 10/01/19 10/28/20  Charlott Rakes, MD  topiramate (TOPAMAX) 50 MG tablet Take 1 tablet (50 mg total) by mouth 2  (two) times daily. Patient not taking: Reported on 02/11/2020 10/01/19   Charlott Rakes, MD  traZODone (DESYREL) 100 MG tablet Take 1 tablet (100 mg total) by mouth at bedtime as needed for sleep. 02/11/20   Mayers, Cari S, PA-C  escitalopram (LEXAPRO) 10 MG tablet Take 10 mg by mouth daily.  01/31/12  [provider]    Allergies    Gadolinium derivatives and Iodinated diagnostic agents  Review of Systems   Review of Systems  All other systems reviewed and are negative.   Physical Exam Updated Vital Signs BP (!) 156/69   Pulse 71   Temp 98.3 F (36.8 C) (Oral)   Resp 18   SpO2 97%   Physical Exam Vitals and nursing note reviewed.  Constitutional:      General: She is not in acute distress.    Appearance: She is well-developed.  HENT:     Head: Normocephalic and atraumatic.  Eyes:     Conjunctiva/sclera: Conjunctivae normal.  Cardiovascular:     Rate and Rhythm: Normal rate and regular rhythm.     Heart sounds: No murmur heard.   Pulmonary:     Effort: Pulmonary effort is normal. No respiratory distress.     Breath sounds: Normal breath sounds.  Abdominal:     Palpations: Abdomen is soft.     Tenderness: There is abdominal tenderness.  Musculoskeletal:        General: Normal range of motion.     Cervical back: Neck supple.  Skin:    General: Skin is warm and dry.  Neurological:     Mental Status: She is alert and oriented to person, place, and time.  Psychiatric:        Mood and Affect: Mood normal.        Behavior: Behavior normal.     ED Results / Procedures / Treatments   Labs (all labs ordered are listed, but only  abnormal results are displayed) Labs Reviewed  BASIC METABOLIC PANEL - Abnormal; Notable for the following components:      Result Value   Glucose, Bld 258 (*)    All other components within normal limits  URINALYSIS, ROUTINE W REFLEX MICROSCOPIC - Abnormal; Notable for the following components:   Glucose, UA 50 (*)    All other  components within normal limits  CBG MONITORING, ED - Abnormal; Notable for the following components:   Glucose-Capillary 203 (*)    All other components within normal limits  CBC  HEPATIC FUNCTION PANEL  LIPASE, BLOOD  CBG MONITORING, ED  CBG MONITORING, ED    EKG None  Radiology No results found. CLINICAL DATA: Right lower quadrant abdominal pain. Contrast allergy.  EXAM: CT ABDOMEN AND PELVIS WITHOUT CONTRAST  TECHNIQUE: Multidetector CT imaging of the abdomen and pelvis was performed following the standard protocol without IV contrast.  COMPARISON: 10/31/2019  FINDINGS: Lower chest: Lung bases are clear.  Hepatobiliary: Changes of hepatic cirrhosis with enlarged lateral segment left and caudate lobes of the liver as well as diffusely nodular contour. No focal lesions are identified. Surgical absence of the gallbladder. No bile duct dilatation.  Pancreas: Unremarkable. No pancreatic ductal dilatation or surrounding inflammatory changes.  Spleen: Normal in size without focal abnormality.  Adrenals/Urinary Tract: Adrenal glands are unremarkable. Kidneys are normal, without renal calculi, focal lesion, or hydronephrosis. Bladder is unremarkable.  Stomach/Bowel: Stomach is within normal limits. Appendix appears normal. No evidence of bowel wall thickening, distention, or inflammatory changes. Scattered colonic diverticula without evidence of diverticulitis.  Vascular/Lymphatic: Aortic atherosclerosis. No enlarged abdominal or pelvic lymph nodes.  Reproductive: Uterus and bilateral adnexa are unremarkable.  Other: No free air or free fluid in the abdomen. Abdominal wall musculature appears intact.  Musculoskeletal: No acute or significant osseous findings.  IMPRESSION: 1. No acute process demonstrated in the abdomen or pelvis. No evidence of bowel obstruction or inflammation. Appendix is normal. 2. Changes of hepatic cirrhosis. 3. Aortic  atherosclerosis. 4. Colonic diverticulosis without evidence of diverticulitis.  Aortic Atherosclerosis (ICD10-I70.0).   Electronically Signed By: Lucienne Capers M.D. On: 09/09/2020 01:36 Procedures Procedures (including critical care time)  Medications Ordered in ED Medications  ondansetron (ZOFRAN) injection 4 mg (has no administration in time range)  morphine 4 MG/ML injection 4 mg (has no administration in time range)    ED Course  I have reviewed the triage vital signs and the nursing notes.  Pertinent labs & imaging results that were available during my care of the patient were reviewed by me and considered in my medical decision making (see chart for details).    MDM Rules/Calculators/A&P                          This patient complains of patient here with hyperglycemia and right-sided abdominal pain x1 week., this involves an extensive number of treatment options, and is a complaint that carries with it a high risk of complications and morbidity.    Differential Dx Appendicitis, constipation, enteritis, kidney stone, UTI, pyelonephritis  Pertinent Labs I ordered, reviewed, and interpreted labs, which included no leukocytosis, no significant anemia, glucose is 203, urinalysis is without evidence of infection, will add LFTs and lipase.  Imaging Interpretation I ordered imaging studies which included CT abdomen/pelvis without contrast (secondary to contrast allergy), which showed no acute process.   Medications I ordered medication morphine and Zofran for pain and nausea.  Sources Previous records obtained and  reviewed history of cholecystectomy.  No recent visits for the same.  Reassessments After the interventions stated above, I reevaluated the patient and found patient feeling improved.  Consultants None  Plan Labs and imaging are reassuring.  Patient is feeling improved.  Do not feel that further additional emergent work-up is indicated.  Patient to  follow-up with PCP    Final Clinical Impression(s) / ED Diagnoses Final diagnoses:  Hyperglycemia  Generalized abdominal pain    Rx / DC Orders ED Discharge Orders    None       Montine Circle, PA-C 09/09/20 Jordan, April, MD 09/09/20 [phone]

## 2020-09-09 NOTE — ED Notes (Signed)
Patient transported to CT 

## 2020-09-14 ENCOUNTER — Emergency Department (HOSPITAL_COMMUNITY)
Admission: EM | Admit: 2020-09-14 | Discharge: 2020-09-15 | Disposition: A | Discharge status: Home / Self Care | Payer: Self-pay | Attending: Emergency Medicine | Admitting: Emergency Medicine

## 2020-09-14 ENCOUNTER — Ambulatory Visit: Payer: Self-pay

## 2020-09-14 ENCOUNTER — Other Ambulatory Visit: Payer: Self-pay

## 2020-09-14 ENCOUNTER — Encounter (HOSPITAL_COMMUNITY): Payer: Self-pay | Admitting: Emergency Medicine

## 2020-09-14 DIAGNOSIS — E1142 Type 2 diabetes mellitus with diabetic polyneuropathy: Secondary | ICD-10-CM | POA: Insufficient documentation

## 2020-09-14 DIAGNOSIS — Z794 Long term (current) use of insulin: Secondary | ICD-10-CM | POA: Insufficient documentation

## 2020-09-14 DIAGNOSIS — E1165 Type 2 diabetes mellitus with hyperglycemia: Secondary | ICD-10-CM | POA: Insufficient documentation

## 2020-09-14 DIAGNOSIS — Z7984 Long term (current) use of oral hypoglycemic drugs: Secondary | ICD-10-CM | POA: Insufficient documentation

## 2020-09-14 DIAGNOSIS — Z7982 Long term (current) use of aspirin: Secondary | ICD-10-CM | POA: Insufficient documentation

## 2020-09-14 DIAGNOSIS — Z955 Presence of coronary angioplasty implant and graft: Secondary | ICD-10-CM | POA: Insufficient documentation

## 2020-09-14 DIAGNOSIS — R110 Nausea: Secondary | ICD-10-CM | POA: Insufficient documentation

## 2020-09-14 DIAGNOSIS — R739 Hyperglycemia, unspecified: Secondary | ICD-10-CM

## 2020-09-14 DIAGNOSIS — R1084 Generalized abdominal pain: Secondary | ICD-10-CM | POA: Insufficient documentation

## 2020-09-14 DIAGNOSIS — I10 Essential (primary) hypertension: Secondary | ICD-10-CM | POA: Insufficient documentation

## 2020-09-14 DIAGNOSIS — Z79899 Other long term (current) drug therapy: Secondary | ICD-10-CM | POA: Insufficient documentation

## 2020-09-14 DIAGNOSIS — Z853 Personal history of malignant neoplasm of breast: Secondary | ICD-10-CM | POA: Insufficient documentation

## 2020-09-14 DIAGNOSIS — G43409 Hemiplegic migraine, not intractable, without status migrainosus: Secondary | ICD-10-CM | POA: Insufficient documentation

## 2020-09-14 DIAGNOSIS — I2510 Atherosclerotic heart disease of native coronary artery without angina pectoris: Secondary | ICD-10-CM | POA: Insufficient documentation

## 2020-09-14 DIAGNOSIS — M791 Myalgia, unspecified site: Secondary | ICD-10-CM | POA: Insufficient documentation

## 2020-09-14 LAB — COMPREHENSIVE METABOLIC PANEL
ALT: 41 U/L (ref 0–44)
AST: 60 U/L — ABNORMAL HIGH (ref 15–41)
Albumin: 3.6 g/dL (ref 3.5–5.0)
Alkaline Phosphatase: 98 U/L (ref 38–126)
Anion gap: 10 (ref 5–15)
BUN: 13 mg/dL (ref 8–23)
CO2: 21 mmol/L — ABNORMAL LOW (ref 22–32)
Calcium: 9.4 mg/dL (ref 8.9–10.3)
Chloride: 106 mmol/L (ref 98–111)
Creatinine, Ser: 0.75 mg/dL (ref 0.44–1.00)
GFR, Estimated: >60 mL/min (ref >60)
Glucose, Bld: 242 mg/dL — ABNORMAL HIGH (ref 70–99)
Potassium: 4.3 mmol/L (ref 3.5–5.1)
Sodium: 137 mmol/L (ref 135–145)
Total Bilirubin: 1 mg/dL (ref 0.3–1.2)
Total Protein: 6.9 g/dL (ref 6.5–8.1)

## 2020-09-14 LAB — CBC
HCT: 43.2 % (ref 36.0–46.0)
Hemoglobin: 14.5 g/dL (ref 12.0–15.0)
MCH: 29.8 pg (ref 26.0–34.0)
MCHC: 33.6 g/dL (ref 30.0–36.0)
MCV: 88.9 fL (ref 80.0–100.0)
Platelets: 138 10*3/uL — ABNORMAL LOW (ref 150–400)
RBC: 4.86 MIL/uL (ref 3.87–5.11)
RDW: 12.8 % (ref 11.5–15.5)
WBC: 5.8 10*3/uL (ref 4.0–10.5)
nRBC: 0 % (ref 0.0–0.2)

## 2020-09-14 LAB — URINALYSIS, ROUTINE W REFLEX MICROSCOPIC
Bacteria, UA: NONE SEEN
Bilirubin Urine: NEGATIVE
Glucose, UA: >=500 mg/dL — AB
Hgb urine dipstick: NEGATIVE
Ketones, ur: NEGATIVE mg/dL
Leukocytes,Ua: NEGATIVE
Nitrite: NEGATIVE
Protein, ur: NEGATIVE mg/dL
Specific Gravity, Urine: 1.011 (ref 1.005–1.030)
pH: 6 (ref 5.0–8.0)

## 2020-09-14 LAB — LIPASE, BLOOD: Lipase: 39 U/L (ref 11–51)

## 2020-09-14 NOTE — ED Triage Notes (Signed)
Patient reports generalized abdominal pain with emesis and headache this week , patient added diabetes uncontrolled by medications .

## 2020-09-14 NOTE — Telephone Encounter (Signed)
Patients daughter called stating that last week her mother was sent to ER for high BS.  She states that the ER brought her BS down but now it is elevating again. Today fasting in am 330  And now 302.  She has taken all morning doses of her BP medication. She states that her mother says she has abdominal cramps and nausea, headache, fatigue,and body aches.  She says her mother says even though she drinks a lot of water her urine appears dark. Per protocol patient will go back to ER for evaluation. Patients daughter was urged to call when her mother discharges for follow up care at office.  She verbalized understanding of all information.  Reason for Disposition . [1] Blood glucose > 240 mg/dL (13.3 mmol/L) AND [2] vomiting AND [3] unable to check for ketones (in blood or urine)  Answer Assessment - Initial Assessment Questions 1. BLOOD GLUCOSE: "What is your blood glucose level?"     330 today now 302 2. ONSET: "When did you check the blood glucose?"    Last week to hospital for BS over 500 3. USUAL RANGE: "What is your glucose level usually?" (e.g., usual fasting morning value, usual evening value)     120S 4. KETONES: "Do you check for ketones (urine or blood test strips)?" If yes, ask: "What does the test show now?"     no 5. TYPE 1 or 2:  "Do you know what type of diabetes you have?"  (e.g., Type 1, Type 2, Gestational; doesn't know)      Type 2 6. INSULIN: "Do you take insulin?" "What type of insulin(s) do you use? What is the mode of delivery? (syringe, pen; injection or pump)?"      lantus 28 units 2 times daily syringe 7. DIABETES PILLS: "Do you take any pills for your diabetes?" If yes, ask: "Have you missed taking any pills recently?"     Glipizide 1 2 times daily No missed doses 8. OTHER SYMPTOMS: "Do you have any symptoms?" (e.g., fever, frequent urination, difficulty breathing, dizziness, weakness, vomiting)     Stomach ache when she eats and bones hurt, fatigued 9. PREGNANCY: "Is  there any chance you are pregnant?" "When was your last menstrual period?"     N/A  Protocols used: DIABETES - HIGH BLOOD SUGAR-A-AH

## 2020-09-15 LAB — CBG MONITORING, ED
Glucose-Capillary: 303 mg/dL — ABNORMAL HIGH (ref 70–99)
Glucose-Capillary: 184 mg/dL — ABNORMAL HIGH (ref 70–99)

## 2020-09-15 MED ORDER — DIPHENHYDRAMINE HCL 50 MG/ML IJ SOLN
12.5000 mg | Freq: Once | INTRAMUSCULAR | Status: AC
  Administered 2020-09-15: 12.5 mg via INTRAVENOUS
  Filled 2020-09-15: qty 1

## 2020-09-15 MED ORDER — METOCLOPRAMIDE HCL 5 MG/ML IJ SOLN
10.0000 mg | Freq: Once | INTRAMUSCULAR | Status: AC
  Administered 2020-09-15: 10 mg via INTRAVENOUS
  Filled 2020-09-15: qty 2

## 2020-09-15 MED ORDER — KETOROLAC TROMETHAMINE 15 MG/ML IJ SOLN
15.0000 mg | Freq: Once | INTRAMUSCULAR | Status: AC
  Administered 2020-09-15: 15 mg via INTRAVENOUS
  Filled 2020-09-15: qty 1

## 2020-09-15 MED ORDER — ACETAMINOPHEN 500 MG PO TABS
1000.0000 mg | ORAL_TABLET | Freq: Once | ORAL | Status: AC
  Administered 2020-09-15: 1000 mg via ORAL
  Filled 2020-09-15: qty 2

## 2020-09-15 MED ORDER — SODIUM CHLORIDE 0.9 % IV BOLUS
1000.0000 mL | Freq: Once | INTRAVENOUS | Status: AC
  Administered 2020-09-15: 1000 mL via INTRAVENOUS

## 2020-09-15 MED ORDER — PROCHLORPERAZINE EDISYLATE 10 MG/2ML IJ SOLN: 10.0000 mg | Freq: Once | INTRAMUSCULAR | Status: DC

## 2020-09-15 NOTE — ED Provider Notes (Signed)
[facility] EMERGENCY DEPARTMENT Provider Note  CSN: [id number] Arrival date & time: 09/14/20 1830  Chief Complaint(s) Abdominal Pain and Hyperglycemia  HPI Brandi Bryant is a 64 y.o. female who presents to the emergency department with 1 week of intermittent but daily headache, nausea and generalized abdominal discomfort with myalgias.  She reports that she was evaluated last week for the same.  Noted to have elevated blood sugar levels.  Treated for headache and instructed to follow-up with PCP.  1.  Headache is generalized but mostly frontal.  Positive phono and photophobia.  No nuchal rigidity.  No fevers.  No cough and congestion.  Associated nausea.  Similar to prior headaches.  2.  Abdominal discomfort.  Periumbilical cramping with radiation to the back.  Nausea without emesis.  No diarrhea.  Denies any urinary symptoms.  No alleviating or aggravating factors.  HPI  Past Medical History Past Medical History:  Diagnosis Date   Abdominal pain    Allergy    Arthralgia 08/13/2013   Bell palsy 2006   Bell's palsy    Breast cancer ([place]) 2009   Left Breast Cancer   Cataract    Chest pain 09/30/2013   Cholelithiasis    Colon cancer screening 40/11/270   Complication of anesthesia    " tubal ligation, in [place]"  had weakness, couldnt stand up the next day, was given pills for oxygen for Brain."  1 month after I was having loss of attentiviness, still have to focus  carefully.    Coronary artery disease    Depression    Diabetes mellitus    Type II   Dyspnea    at times- when air conditioner is runnimg, heat also    Encounter for screening colonoscopy 10/30/2015   GERD (gastroesophageal reflux disease)    Hepatic steatosis    History of breast cancer 06/01/2012   HTN (hypertension)    Hx of adenomatous polyp of colon 11/07/2019   Hyperlipidemia    Personal history of chemotherapy 2009   Left Breast Cancer   Personal  history of radiation therapy 2009   Left Breast Cancer   Stroke [facility])    2006, 2015   Patient Active Problem List   Diagnosis Date Noted   Cirrhosis of liver without ascites ([place]) 11/14/2019   Kidney lesion, native, left 11/14/2019   Hx of adenomatous polyp of colon 11/07/2019   Well woman exam with routine gynecological exam 08/06/2019   Breast pain, right 08/06/2019   Internal carotid artery stenosis 04/03/2018   CVA (cerebral vascular accident) ([place]) 03/30/2018   GERD (gastroesophageal reflux disease) 11/27/2017   Chronic sinusitis 05/16/2017   Diabetic polyneuropathy associated with type 2 diabetes mellitus ([place]) 10/31/2016   Impingement syndrome of right ankle 10/31/2016   Symptomatic cholelithiasis 12/09/2015   Palpitations 08/28/2014   Preventive measure 08/21/2014   Intractable headache 02/21/2014   TIA (transient ischemic attack) 02/20/2014   Trigeminal neuralgia 01/22/2014   Weakness 01/22/2014   Bell's palsy 01/21/2014   UTI (urinary tract infection) 01/21/2014   NAFLD (nonalcoholic fatty liver disease) 01/21/2014   Headache 01/20/2014   Diabetes type 2, uncontrolled ([place]) 01/20/2014   Facial droop 01/15/2014   Stroke ([place]) 01/15/2014   Type 2 diabetes mellitus with diabetic neuropathy ([place]) 12/06/2013   Hyperlipidemia 11/07/2013   CAD (coronary artery disease), native coronary artery 11/07/2013   Chest pain 09/30/2013   Arthralgia 08/13/2013   History of breast cancer 06/01/2012   Abdominal pain 01/31/2012   HTN (hypertension)  Diabetes mellitus ([place])    Home Medication(s) Prior to Admission medications   Medication Sig Start Date End Date Taking? Authorizing Provider  acetaminophen (TYLENOL) 500 MG tablet Take 1 tablet (500 mg total) by mouth every 6 (six) hours as needed. Patient not taking: Reported on 02/11/2020 09/26/19   Chase Picket, MD  albuterol (PROVENTIL HFA;VENTOLIN HFA) 108 (90 Base) MCG/ACT inhaler Inhale  1-2 puffs into the lungs every 6 (six) hours as needed for wheezing or shortness of breath. Patient not taking: Reported on 02/11/2020 04/25/17   Charlott Rakes, MD  aspirin 325 MG EC tablet Take 1 tablet (325 mg total) by mouth daily. 06/14/18   Argentina Donovan, PA-C  atorvastatin (LIPITOR) 40 MG tablet Take 1 tablet (40 mg total) by mouth daily. 02/11/20   Mayers, Cari S, PA-C  carvedilol (COREG) 12.5 MG tablet Take 1 tablet (12.5 mg total) by mouth 2 (two) times daily with a meal. 02/11/20   Mayers, Cari S, PA-C  diclofenac Sodium (VOLTAREN) 1 % GEL Apply 2g QID x 7 days to affected area in left hip for pain Patient not taking: Reported on 02/11/2020 01/18/20   Domenic Moras, PA-C  glipiZIDE (GLUCOTROL) 10 MG tablet Take 1 tablet (10 mg total) by mouth 2 (two) times daily before a meal. 02/11/20   Mayers, Cari S, PA-C  ibuprofen (ADVIL) 600 MG tablet Take 1 tablet (600 mg total) by mouth every 6 (six) hours as needed. Patient not taking: Reported on 02/11/2020 01/18/20   Domenic Moras, PA-C  insulin glargine (LANTUS SOLOSTAR) 100 UNIT/ML Solostar Pen Inject 28 Units into the skin 2 (two) times daily. 02/11/20   Mayers, Cari S, PA-C  Insulin Pen Needle (TRUEPLUS 5-BEVEL PEN NEEDLES) 32G X 4 MM MISC Use to inject Lantus daily. 02/11/20   Mayers, Cari S, PA-C  lisinopril (ZESTRIL) 10 MG tablet Take 1 tablet (10 mg total) by mouth daily. 02/11/20   Mayers, Cari S, PA-C  olopatadine (PATANOL) 0.1 % ophthalmic solution Place 1 drop into both eyes 2 (two) times daily. 03/26/19   Charlott Rakes, MD  pantoprazole (PROTONIX) 40 MG tablet Take 1 tablet (40 mg total) by mouth daily. 10/01/19   Charlott Rakes, MD  predniSONE (DELTASONE) 20 MG tablet 3 tabs po day one, then 2 tabs daily x 4 days Patient not taking: Reported on 02/11/2020 01/18/20   Domenic Moras, PA-C  promethazine (PHENERGAN) 25 MG tablet Take 1 tablet (25 mg total) by mouth every 8 (eight) hours as needed for nausea or vomiting. 10/30/18   Charlott Rakes, MD    SUMAtriptan (IMITREX) 25 MG tablet Take 1 tablet (25 mg total) by mouth once for 1 dose. Please take 1 tablet at outset of headache.May repeat in 2 hours if headache persists or recurs. 10/01/19 10/28/20  Charlott Rakes, MD  topiramate (TOPAMAX) 50 MG tablet Take 1 tablet (50 mg total) by mouth 2 (two) times daily. Patient not taking: Reported on 02/11/2020 10/01/19   Charlott Rakes, MD  traZODone (DESYREL) 100 MG tablet Take 1 tablet (100 mg total) by mouth at bedtime as needed for sleep. 02/11/20   Mayers, Cari S, PA-C  escitalopram (LEXAPRO) 10 MG tablet Take 10 mg by mouth daily.  01/31/12  [provider]  Past Surgical History Past Surgical History:  Procedure Laterality Date   ANKLE ARTHROSCOPY Right 12/14/2016   Procedure: Right Ankle Arthroscopic Debridement;  Surgeon: Newt Minion, MD;  Location: [facility];  Service: Orthopedics;  Laterality: Right;   Arm surgery     Left tendon lengthen   BREAST LUMPECTOMY Left 2009   CHOLECYSTECTOMY N/A 12/09/2015   Procedure: LAPAROSCOPIC CHOLECYSTECTOMY WITH INTRAOPERATIVE CHOLANGIOGRAM;  Surgeon: Greer Pickerel, MD;  Location: [facility];  Service: General;  Laterality: N/A;   LEFT HEART CATHETERIZATION WITH CORONARY ANGIOGRAM N/A 10/30/2013   Procedure: LEFT HEART CATHETERIZATION WITH CORONARY ANGIOGRAM;  Surgeon: Josue Hector, MD;  Location: [facility] CATH LAB;  Service: Cardiovascular;  Laterality: N/A;   porta cath Right    Removal of Porta cath Right    TUBAL LIGATION     Family History Family History  Problem Relation Age of Onset   Thyroid cancer Sister    Hypertension Mother    Diabetes Mother    Migraines Daughter    Colon cancer Neg Hx    Esophageal cancer Neg Hx    Stomach cancer Neg Hx    Rectal cancer Neg Hx     Social History Social History   Tobacco Use   Smoking status: Never  Smoker   Smokeless tobacco: Never Used  Scientific laboratory technician Use: Never used  Substance Use Topics   Alcohol use: No   Drug use: No   Allergies Gadolinium derivatives and Iodinated diagnostic agents  Review of Systems Review of Systems All other systems are reviewed and are negative for acute change except as noted in the HPI  Physical Exam Vital Signs  I have reviewed the triage vital signs BP 131/76 (BP Location: Left Arm)    Pulse 63    Temp 98.4 F (36.9 C) (Oral)    Resp 16    Ht 5' (1.524 m)    Wt 75 kg    SpO2 98%    BMI 32.29 kg/m   Physical Exam Vitals reviewed.  Constitutional:      General: She is not in acute distress.    Appearance: She is well-developed. She is not diaphoretic.  HENT:     Head: Normocephalic and atraumatic.     Right Ear: External ear normal.     Left Ear: External ear normal.     Nose: Nose normal.  Eyes:     General: No scleral icterus.    Conjunctiva/sclera: Conjunctivae normal.  Neck:     Trachea: Phonation normal.  Cardiovascular:     Rate and Rhythm: Normal rate and regular rhythm.  Pulmonary:     Effort: Pulmonary effort is normal. No respiratory distress.     Breath sounds: No stridor.  Abdominal:     General: There is no distension.     Tenderness: There is no abdominal tenderness. There is no guarding or rebound.  Musculoskeletal:        General: Normal range of motion.     Cervical back: Normal range of motion.  Neurological:     Mental Status: She is alert and oriented to person, place, and time.  Psychiatric:        Behavior: Behavior normal.     ED Results and Treatments Labs (all labs ordered are listed, but only abnormal results are displayed) Labs Reviewed  COMPREHENSIVE METABOLIC PANEL - Abnormal; Notable for the following components:      Result Value   CO2 21 (*)    Glucose,  Bld 242 (*)    AST 60 (*)    All other components within normal limits  CBC - Abnormal; Notable for the following components:     Platelets 138 (*)    All other components within normal limits  URINALYSIS, ROUTINE W REFLEX MICROSCOPIC - Abnormal; Notable for the following components:   Glucose, UA >=500 (*)    All other components within normal limits  CBG MONITORING, ED - Abnormal; Notable for the following components:   Glucose-Capillary 303 (*)    All other components within normal limits  CBG MONITORING, ED - Abnormal; Notable for the following components:   Glucose-Capillary 184 (*)    All other components within normal limits  LIPASE, BLOOD                                                                                                                         EKG  EKG Interpretation  Date/Time:    Ventricular Rate:    PR Interval:    QRS Duration:   QT Interval:    QTC Calculation:   R Axis:     Text Interpretation:        Radiology No results found.  Pertinent labs & imaging results that were available during my care of the patient were reviewed by me and considered in my medical decision making (see chart for details).  Medications Ordered in ED Medications  sodium chloride 0.9 % bolus 1,000 mL (1,000 mLs Intravenous New Bag/Given 09/15/20 0428)  ketorolac (TORADOL) 15 MG/ML injection 15 mg (15 mg Intravenous Given 09/15/20 0428)  metoCLOPramide (REGLAN) injection 10 mg (10 mg Intravenous Given 09/15/20 0427)  diphenhydrAMINE (BENADRYL) injection 12.5 mg (12.5 mg Intravenous Given 09/15/20 0429)  acetaminophen (TYLENOL) tablet 1,000 mg (1,000 mg Oral Given 09/15/20 0544)                                                                                                                                    Procedures Procedures  (including critical care time)  Medical Decision Making / ED Course I have reviewed the nursing notes for this encounter and the patient's prior records (if available in EHR or on provided paperwork).   Mckinzie Saksa was evaluated in Emergency Department on  09/15/2020 for the symptoms described in the history of present illness. She was evaluated in the context of the global COVID-19 pandemic, which necessitated consideration that  the patient might be at risk for infection with the SARS-CoV-2 virus that causes COVID-19. Institutional protocols and algorithms that pertain to the evaluation of patients at risk for COVID-19 are in a state of rapid change based on information released by regulatory bodies including the CDC and federal and state organizations. These policies and algorithms were followed during the patient's care in the ED.  Typical migraine headache for the pt. Non focal neuro exam. No recent head trauma. No fever. Doubt meningitis. Doubt intracranial bleed. Doubt IIH. No indication for imaging.   Also reports abdominal pain but exam is benign.  Labs grossly reassuring without leukocytosis or anemia.  No significant electrolyte derangements or renal sufficiency.  Patient does have hyperglycemia without evidence of DKA.  UA without evidence of infection.  Will treat with migraine cocktail and reevaluate.  Following treatment patient had significant improvement in her symptoms.  Able to tolerate oral intake.      Final Clinical Impression(s) / ED Diagnoses Final diagnoses:  Hemiplegic migraine without status migrainosus, not intractable  Hyperglycemia    The patient appears reasonably screened and/or stabilized for discharge and I doubt any other medical condition or other [facility] requiring further screening, evaluation, or treatment in the ED at this time prior to discharge. Safe for discharge with strict return precautions.  Disposition: Discharge  Condition: Good  I have discussed the results, Dx and Tx plan with the patient/family who expressed understanding and agree(s) with the plan. Discharge instructions discussed at length. The patient/family was given strict return precautions who verbalized understanding of the instructions.  No further questions at time of discharge.    ED Discharge Orders    None       Follow Up: Charlott Rakes, MD [place] [address] [phone]  Schedule an appointment as soon as possible for a visit       This chart was dictated using voice recognition software.  Despite best efforts to proofread,  errors can occur which can change the documentation meaning.   Fatima Blank, MD 09/15/20 [phone]

## 2020-09-16 ENCOUNTER — Other Ambulatory Visit: Payer: Self-pay | Admitting: Family Medicine

## 2020-09-16 ENCOUNTER — Encounter: Payer: Self-pay | Admitting: Family Medicine

## 2020-09-16 ENCOUNTER — Ambulatory Visit: Payer: Self-pay | Attending: Family Medicine | Admitting: Family Medicine

## 2020-09-16 ENCOUNTER — Other Ambulatory Visit: Payer: Self-pay

## 2020-09-16 VITALS — BP 155/67 | HR 65 | Temp 97.2°F | Wt 157.2 lb

## 2020-09-16 DIAGNOSIS — Z09 Encounter for follow-up examination after completed treatment for conditions other than malignant neoplasm: Secondary | ICD-10-CM

## 2020-09-16 DIAGNOSIS — K219 Gastro-esophageal reflux disease without esophagitis: Secondary | ICD-10-CM

## 2020-09-16 DIAGNOSIS — R350 Frequency of micturition: Secondary | ICD-10-CM

## 2020-09-16 DIAGNOSIS — E1140 Type 2 diabetes mellitus with diabetic neuropathy, unspecified: Secondary | ICD-10-CM

## 2020-09-16 DIAGNOSIS — R1013 Epigastric pain: Secondary | ICD-10-CM

## 2020-09-16 DIAGNOSIS — Z794 Long term (current) use of insulin: Secondary | ICD-10-CM

## 2020-09-16 DIAGNOSIS — R110 Nausea: Secondary | ICD-10-CM

## 2020-09-16 DIAGNOSIS — G43709 Chronic migraine without aura, not intractable, without status migrainosus: Secondary | ICD-10-CM

## 2020-09-16 MED ORDER — PANTOPRAZOLE SODIUM 40 MG PO TBEC: 40.0000 mg | DELAYED_RELEASE_TABLET | Freq: Two times a day (BID) | ORAL | 4 refills | Status: DC

## 2020-09-16 MED ORDER — RIZATRIPTAN BENZOATE 5 MG PO TABS: 5.0000 mg | ORAL_TABLET | ORAL | 3 refills | Status: DC | PRN

## 2020-09-16 MED ORDER — ONDANSETRON HCL 4 MG PO TABS: 4.0000 mg | ORAL_TABLET | Freq: Three times a day (TID) | ORAL | 0 refills | Status: DC | PRN

## 2020-09-16 MED FILL — ONDANSETRON HCL 4 MG TABLET: 4 | 6 days supply | Qty: 20 | Fill #0

## 2020-09-16 MED FILL — RIZATRIPTAN 5 MG TABLET: 5 | 30 days supply | Qty: 9 | Fill #0

## 2020-09-16 MED FILL — PANTOPRAZOLE SOD DR 40 MG T: 40 | 30 days supply | Qty: 60 | Fill #0

## 2020-09-16 NOTE — Progress Notes (Signed)
Elevated BS NEVER RCVD IMITREX

## 2020-09-16 NOTE — Patient Instructions (Signed)
Enfermedad de reflujo gastroesofgico en los adultos Gastroesophageal Reflux Disease, Adult El reflujo gastroesofgico (RGE) ocurre cuando el cido del estmago sube por el tubo que conecta la boca con el estmago (esfago). Normalmente, la comida baja por el esfago y se mantiene en el estmago, donde se la digiere. Cuando una persona tiene RGE, los alimentos y el cido estomacal suelen volver al esfago. Usted puede tener una enfermedad llamada enfermedad de reflujo gastroesofgico (ERGE) si el reflujo:  Sucede a menudo.  Causa sntomas frecuentes o muy intensos.  Causa problemas tales como dao en el esfago. Cuando esto ocurre, el esfago duele y se hincha (inflama). Con el tiempo, la ERGE puede ocasionar pequeos agujeros (lceras) en el revestimiento del esfago. Cules son las causas? Esta afeccin se debe a un problema en el msculo que se encuentra entre el esfago y [place]. Cuando este msculo est dbil o no es normal, no se cierra correctamente para impedir que los alimentos y el cido regresen del Paramedic. El msculo puede debilitarse debido a lo siguiente:  El consumo de [place].  [place].  Tener cierto tipo de hernia (hernia de hiato).  Consumo de alcohol.  Ciertos alimentos y bebidas, como caf, chocolate, cebollas y [place]. Qu incrementa el riesgo? Es ms probable que tenga esta afeccin si:  Tiene sobrepeso.  Tiene una enfermedad que afecta el tejido conjuntivo.  [place] antiinflamatorios no esteroideos (AINE). Cules son los signos o los sntomas? Los sntomas de esta afeccin incluyen:  Acidez estomacal.  Dificultad o dolor al tragar.  Sensacin de Best boy un bulto en la garganta.  Sabor amargo en la boca.  Mal aliento.  Tener una gran cantidad de saliva.  Estmago inflamado o con Tree surgeon.  Eructos.  Dolor en el pecho. El dolor de pecho puede deberse a distintas afecciones. Asegrese de Teacher, adult education a su mdico si tiene Tourist information centre manager.  Falta  de aire o respiracin ruidosa (sibilancias).  Tos constante (crnica) o durante la noche.  Desgaste de la superficie de los dientes (esmalte dental).  Prdida de peso. Cmo se trata? El tratamiento depender de la gravedad de los sntomas. El mdico puede sugerirle lo siguiente:  Cambios en la dieta.  Medicamentos.  Clementeen Hoof. Siga estas indicaciones en su casa: Comida y bebida   Siga una dieta como se lo haya indicado el mdico. Es posible que deba evitar alimentos y bebidas, por ejemplo: ? Caf y t (con o sin cafena). ? Bebidas que contengan alcohol. ? Bebidas energticas y deportivas. ? Bebidas gaseosas y refrescos. ? Chocolate y cacao. ? Menta y [place]. ? Ajo y cebolla. ? Rbano picante. ? Alimentos cidos y condimentados. Estos incluyen todos los tipos de pimientos, [place] en polvo, curry en polvo, vinagre, salsas picantes y [employer]. ? Ctricos y sus jugos, por ejemplo, naranjas, limones y limas. ? Alimentos que [employer]. Estos incluyen salsa roja, [place], salsa picante y pizza con salsa de [place]. ? Alimentos fritos y Radio broadcast assistant. Estos incluyen donas, papas fritas, papitas fritas de bolsa y aderezos con alto contenido de [place]. ? Carnes con alto contenido de [place]. Estas incluye los perros calientes, chuletas o costillas, embutidos, jamn y tocino. ? Productos lcteos ricos en grasas, como leche [place], [place] y [place] crema.  Consuma pequeas cantidades de comida con ms frecuencia. Evite consumir porciones abundantes.  Evite beber grandes cantidades de lquidos con las comidas.  Evite comer 2 o 3horas antes de acostarse.  Evite recostarse inmediatamente despus de comer.  No haga ejercicios  enseguida despus de comer. Estilo de vida   No consuma ningn producto que contenga nicotina o tabaco. Estos incluyen cigarrillos, cigarrillos electrnicos y tabaco para Higher education careers adviser. Si necesita ayuda para dejar de fumar, consulte al [employer].  Intente  reducir [employer] de estrs. Si necesita ayuda para hacer esto, consulte al mdico.  Si tiene sobrepeso, baje una cantidad de peso saludable para usted. Consulte a su mdico para bajar de peso de [employer]. Indicaciones generales  Est atento a cualquier cambio en los sntomas.  Tome los medicamentos de venta libre y los recetados solamente como se lo haya indicado el mdico. No tome aspirina, ibuprofeno ni otros AINE a menos que el mdico lo autorice.  Use ropa holgada. No use nada apretado alrededor de la cintura.  Levante (eleve) la cabecera de la cama aproximadamente 6pulgadas (15cm).  Evite inclinarse si al hacerlo empeoran los sntomas.  Concurra a todas las visitas de seguimiento como se lo haya indicado el mdico. Esto es importante. Comunquese con un mdico si:  Aparecen nuevos sntomas.  Adelgaza y no sabe por qu.  Tiene problemas para tragar o le duele cuando traga.  Tiene sibilancias o tos persistente.  Los sntomas no mejoran con Dispensing optician.  Tiene la voz ronca. Solicite ayuda inmediatamente si:  [employer], el cuello, la [place], los dientes o la espalda.  Se siente transpirado, mareado o tiene una sensacin de desvanecimiento.  Siente falta de aire o Tourist information centre manager.  Vomita y el vmito tiene un aspecto similar a la sangre o a los posos de caf.  Pierde el conocimiento (se desmaya).  Las deposiciones (heces) son sanguinolentas o negras.  No puede tragar, beber o comer. Resumen  Si una persona tiene enfermedad de reflujo gastroesofgico (ERGE), los alimentos y el cido estomacal suben al esfago y causan sntomas o problemas tales como dao en el esfago.  El tratamiento depender de la gravedad de los sntomas.  Siga una Lexicographer se lo haya indicado el [place] todos los medicamentos solamente como se lo haya indicado el mdico. Esta informacin no tiene Marine scientist el consejo del mdico. Asegrese de  hacerle al mdico cualquier pregunta que tenga. Document Revised: 06/21/2018 Document Reviewed: 06/21/2018 Elsevier Patient Education  [place].  Informacin bsica sobre la diabetes Diabetes Basics  La diabetes (diabetes mellitus) es una enfermedad de larga duracin (crnica). Se produce cuando el cuerpo no utiliza Occupational hygienist (glucosa) que se libera de los alimentos despus de comer. La diabetes puede deberse a uno de [employer] o a ambos:  El pncreas no produce suficiente cantidad de una hormona llamada insulina.  El cuerpo no reacciona de forma normal a la insulina que produce. La insulina permite que ciertos azcares (glucosa) ingresen a las clulas del cuerpo. Esto le proporciona energa. Si tiene diabetes, los azcares no pueden ingresar a las clulas. Esto produce un aumento del nivel de Dispensing optician (hiperglucemia). Sigue estas instrucciones en tu casa: Cmo se trata la diabetes? Es posible que tenga que administrarse insulina u otros medicamentos para la diabetes todos los [place] para mantener el nivel de Location manager en la sangre equilibrado. Adminstrese los medicamentos para la diabetes todos los [place] se lo haya indicado el mdico. Haga una lista de los medicamentos para la diabetes aqu: Medicamentos para la diabetes  Nombre del medicamento: ______________________________ ? Cantidad (dosis): ________________ Mellody Drown (a.m./p.m.): _______________ Wilford Grist: ___________________________________  Micki Riley medicamento: ______________________________ ? Cantidad (dosis): ________________  Hora (a.m./p.m.): _______________ Wilford Grist: ___________________________________  Valrie Hart del medicamento: ______________________________ ? Cantidad (dosis): ________________ Mellody Drown (a.m./p.m.): _______________ Wilford Grist: ___________________________________ Si [place] insulina, aprender cmo aplicrsela con inyecciones. Es posible que deba ajustar la cantidad en funcin de los  alimentos que coma. Haga una lista de los tipos de [place] aqu: Insulina  Tipo de insulina: ______________________________ ? Cantidad (dosis): ________________ Mellody Drown (a.m./p.m.): _______________ Wilford Grist: ___________________________________  Suezanne Jacquet: ______________________________ ? Cantidad (dosis): ________________ Mellody Drown (a.m./p.m.): _______________ Wilford Grist: ___________________________________  Suezanne Jacquet: ______________________________ ? Cantidad (dosis): ________________ Mellody Drown (a.m./p.m.): _______________ Wilford Grist: ___________________________________  Suezanne Jacquet: ______________________________ ? Cantidad (dosis): ________________ Mellody Drown (a.m./p.m.): _______________ Wilford Grist: ___________________________________  Suezanne Jacquet: ______________________________ ? Cantidad (dosis): ________________ Mellody Drown (a.m./p.m.): _______________ Wilford Grist: ___________________________________ Cmo me controlo el nivel de azcar en la sangre?  Controle sus niveles de azcar en la sangre con un medidor de glucemia segn las indicaciones del mdico. El mdico fijar los objetivos del tratamiento para usted. Generalmente, los resultados de los niveles de azcar en la sangre deben ser los siguientes:  Antes de las comidas (preprandial): de 80 a 130mg /dl (de 4,4 a 7,53mmol/l).  Despus de las comidas (posprandial): por debajo de 180mg /dl (50mmol/l).  Nivel de A1c: menos del 7%. Anote las veces que se controlar los niveles de azcar en la sangre: Controles de azcar en la sangre  Hora: _______________ Wilford Grist: ___________________________________  Mellody Drown: _______________ Notas: ___________________________________  Hora: _______________ Notas: ___________________________________  Hora: _______________ Notas: ___________________________________  Hora: _______________ Notas: ___________________________________  Hora: _______________ Notas:  ___________________________________  Sander Nephew debo saber acerca del nivel bajo de azcar en la sangre? Un nivel bajo de azcar en la sangre se denomina hipoglucemia. Este cuadro ocurre cuando el nivel de Location manager en la sangre es igual o menor que 70mg /dl (3,77mmol/l). Entre los sntomas, se pueden incluir los siguientes:  Sentir: ? [place]. ? Preocupacin o nervios (ansiedad). ? Sudoracin y [employer]. ? Confusin. ? Mareos. ? Somnolencia. ? Ganas de vomitar (nuseas).  Tener: ? Latidos cardacos acelerados. ? Dolor de [place]. ? Cambios en la visin. ? Hormigueo y falta de sensibilidad (entumecimiento) alrededor de la boca, los labios o la [place]. ? Movimientos espasmdicos que no puede controlar (convulsiones).  Dificultades para hacer lo siguiente: ? Moverse (coordinacin). ? [place]. ? Desmayos. ? Molestarse con facilidad (irritabilidad). Tratamiento del nivel bajo de azcar en la sangre Para tratar un nivel bajo de azcar en la sangre, ingiera un alimento o una bebida azucarada de inmediato. Si puede pensar con claridad y tragar de manera segura, siga la regla 15/15, que consiste en lo siguiente:  Consuma 15gramos de un hidrato de carbono de accin rpida (carbohidrato). Hable con su mdico acerca de cunto debera consumir.  Algunos hidratos de carbono de accin rpida son: ? Comprimidos de azcar (pastillas de glucosa). Consuma 3o 4pastillas de glucosa. ? De 6 a 8unidades de caramelos duros. ? De 4 a 6onzas (de 120 a 172ml) de jugo de frutas. ? De 4 a 6onzas (de 120 a 152ml) de refresco comn (no diettico). ? 1 cucharada (79ml) de miel o azcar.  Contrlese el nivel de azcar en la sangre 91minutos despus de ingerir el hidrato de carbono.  Si el nivel de azcar en la sangre todava es igual o menor que 70mg /dl (3,32mmol/l), ingiera nuevamente 15gramos de un hidrato de carbono.  Si el nivel de azcar en la sangre no supera los 70mg /dl (3,21mmol/l) despus de  3intentos, solicite ayuda de inmediato.  Ingiera una comida o una colacin en el transcurso de 1hora despus  de que el nivel de azcar en la sangre se haya normalizado. Tratamiento del nivel muy bajo de azcar en la sangre Si el nivel de azcar en la sangre es igual o menor que 54mg /dl (59mmol/l), significa que est muy bajo (hipoglucemia grave). Esto es Engineer, maintenance (IT). No espere a ver si los sntomas desaparecen. Solicite atencin mdica de inmediato. Comunquese con el servicio de emergencias de su localidad (911 en los [place]). No conduzca por sus propios medios [employer] hospital. Preguntas para hacerle al mdico  Es necesario que me rena con Radio broadcast assistant en el cuidado de la diabetes?  Qu equipos necesitar para cuidarme en casa?  Qu medicamentos para la diabetes necesito? Cundo debo tomarlos?  Con qu frecuencia debo controlar mi nivel de azcar en la sangre?  A qu nmero puedo llamar si tengo preguntas?  Cundo es mi prxima cita con el mdico?  Dnde puedo encontrar un grupo de apoyo para las personas con diabetes? Dnde buscar ms informacin  American Diabetes Association (Asociacin Estadounidense de la Diabetes): www.diabetes.org  American Association of Diabetes Educators (Asociacin Estadounidense de Instructores para el Cuidado de la Diabetes): www.diabeteseducator.org/patient-resources Comunquese con un mdico si:  El nivel de azcar en la sangre es igual o mayor que 240mg /dl (13,26mmol/dl) durante 2das seguidos.  Ha estado enfermo o ha tenido fiebre durante 2das o ms y no mejora.  Tiene alguno de estos problemas durante ms de 6horas: ? No puede comer ni beber. ? Siente malestar estomacal (nuseas). ? Vomita. ? Presenta heces lquidas (diarrea). Solicite ayuda inmediatamente si:  El nivel de azcar en la sangre est por debajo de 54mg /dl (47mmol/l).  Se siente confundida.  Tiene dificultad para hacer lo siguiente: ? Pensar  con claridad. ? La respiracin. Resumen  La diabetes (diabetes mellitus) es una enfermedad de larga duracin (crnica). Se produce cuando el cuerpo no utiliza Occupational hygienist (glucosa) que se libera de los alimentos despus de la digestin.  Aplquese la insulina y tome los medicamentos para la diabetes como se lo hayan indicado.  Contrlese el nivel de azcar en la sangre todos los [place], con la frecuencia que le hayan indicado.  Concurra a todas las visitas de seguimiento como se lo haya indicado el mdico. Esto es importante. Esta informacin no tiene Marine scientist el consejo del mdico. Asegrese de hacerle al mdico cualquier pregunta que tenga. Document Revised: 01/02/2019 Document Reviewed: 03/16/2018 Elsevier Patient Education  [place].

## 2020-09-16 NOTE — Progress Notes (Signed)
Established Patient Office Visit  Subjective:  Patient ID: Brandi Bryant, female    DOB: 12/01/1955  Age: 64 y.o. MRN: [id number]  CC:  Chief Complaint  Patient presents with  . Blood Sugar Problem    HPI Brandi Bryant, 64 year old female, who is status post emergency department visit on 09/08/2020 due to hyperglycemia, and abdominal pain along with headache which started while in the emergency department and patient with ED visit on 09/14/2020 due to 1 week of daily headaches along with generalized abdominal discomfort and muscle aches.  Patient had CT scan of the abdomen during 09/08/2020 ED visit with findings including changes of hepatic cirrhosis, aortic atherosclerosis and colonic diverticulosis without evidence of diverticulitis.  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  Patient was treated with IV Toradol, Reglan and Benadryl as well as oral acetaminophen with relief of headache.  Initial fingerstick blood sugar in the emergency department was 303 and 184 on recheck.         At today's visit, she reports continued issues with recurrent migraine type headaches with which she has nausea and sensitivity to light.  She also reports continued increases in her blood sugars as well as increased thirst and urinary frequency.  She also has increased issues with nausea and acid reflux symptoms.  She also reports that she has recurrent cough which she believes is related to reflux.  She does report mid upper abdominal discomfort and sensation of backwash of bad tasting fluid into her mouth and throat.  Past Medical History:  Diagnosis Date  . Abdominal pain   . Allergy   . Arthralgia 08/13/2013  . Bell palsy 2006  . Bell's palsy   . Breast cancer ([place]) 2009   Left Breast Cancer  . Cataract   . Chest pain 09/30/2013  . Cholelithiasis   . Colon cancer screening 10/27/2015  . Complication of  anesthesia    " tubal ligation, in [place]"  had weakness, couldnt stand up the next day, was given pills for oxygen for Brain."  1 month after I was having loss of attentiviness, still have to focus  carefully.   . Coronary artery disease   . Depression   . Diabetes mellitus    Type II  . Dyspnea    at times- when air conditioner is runnimg, heat also   . Encounter for screening colonoscopy 10/30/2015  . GERD (gastroesophageal reflux disease)   . Hepatic steatosis   . History of breast cancer 06/01/2012  . HTN (hypertension)   . Hx of adenomatous polyp of colon 11/07/2019  . Hyperlipidemia   . Personal history of chemotherapy 2009   Left Breast Cancer  . Personal history of radiation therapy 2009   Left Breast Cancer  . Stroke [facility])    2006, 2015    Past Surgical History:  Procedure Laterality Date  . ANKLE ARTHROSCOPY Right 12/14/2016   Procedure: Right Ankle Arthroscopic Debridement;  Surgeon: Newt Minion, MD;  Location: [place];  Service: Orthopedics;  Laterality: Right;  . Arm surgery     Left tendon lengthen  . BREAST LUMPECTOMY Left 2009  . CHOLECYSTECTOMY N/A 12/09/2015   Procedure: LAPAROSCOPIC CHOLECYSTECTOMY WITH INTRAOPERATIVE CHOLANGIOGRAM;  Surgeon: Greer Pickerel, MD;  Location: [facility];  Service: General;  Laterality: N/A;  . LEFT HEART CATHETERIZATION WITH CORONARY ANGIOGRAM N/A 10/30/2013   Procedure: LEFT HEART CATHETERIZATION WITH CORONARY ANGIOGRAM;  Surgeon: Josue Hector, MD;  Location: [facility]  CATH LAB;  Service: Cardiovascular;  Laterality: N/A;  . porta cath Right   . Removal of Porta cath Right   . TUBAL LIGATION      Family History  Problem Relation Age of Onset  . Thyroid cancer Sister   . Hypertension Mother   . Diabetes Mother   . Migraines Daughter   . Colon cancer Neg Hx   . Esophageal cancer Neg Hx   . Stomach cancer Neg Hx   . Rectal cancer Neg Hx     Social History   Socioeconomic History  . Marital status: Single    Spouse name: Not on  file  . Number of children: 6  . Years of education: Not on file  . Highest education level: High school graduate  Occupational History  . Not on file  Tobacco Use  . Smoking status: Never Smoker  . Smokeless tobacco: Never Used  Vaping Use  . Vaping Use: Never used  Substance and Sexual Activity  . Alcohol use: No  . Drug use: No  . Sexual activity: Not Currently    Birth control/protection: Surgical  Other Topics Concern  . Not on file  Social History Narrative   Single with 6 children    lives with her daughter   Right handed   Never smoker no drug use no alcohol   Social Determinants of Health   Financial Resource Strain:   . Difficulty of Paying Living Expenses: Not on file  Food Insecurity:   . Worried About Charity fundraiser in the Last Year: Not on file  . Ran Out of Food in the Last Year: Not on file  Transportation Needs:   . Lack of Transportation (Medical): Not on file  . Lack of Transportation (Non-Medical): Not on file  Physical Activity:   . Days of Exercise per Week: Not on file  . Minutes of Exercise per Session: Not on file  Stress:   . Feeling of Stress : Not on file  Social Connections:   . Frequency of Communication with Friends and Family: Not on file  . Frequency of Social Gatherings with Friends and Family: Not on file  . Attends Religious Services: Not on file  . Active Member of Clubs or Organizations: Not on file  . Attends Archivist Meetings: Not on file  . Marital Status: Not on file  Intimate Partner Violence:   . Fear of Current or Ex-Partner: Not on file  . Emotionally Abused: Not on file  . Physically Abused: Not on file  . Sexually Abused: Not on file    Outpatient Medications Prior to Visit  Medication Sig Dispense Refill  . aspirin 325 MG EC tablet Take 1 tablet (325 mg total) by mouth daily. 30 tablet 6  . atorvastatin (LIPITOR) 40 MG tablet Take 1 tablet (40 mg total) by mouth daily. 30 tablet 6  . carvedilol  (COREG) 12.5 MG tablet Take 1 tablet (12.5 mg total) by mouth 2 (two) times daily with a meal. 60 tablet 6  . glipiZIDE (GLUCOTROL) 10 MG tablet Take 1 tablet (10 mg total) by mouth 2 (two) times daily before a meal. 60 tablet 6  . insulin glargine (LANTUS SOLOSTAR) 100 UNIT/ML Solostar Pen Inject 28 Units into the skin 2 (two) times daily. 30 mL 6  . Insulin Pen Needle (TRUEPLUS 5-BEVEL PEN NEEDLES) 32G X 4 MM MISC Use to inject Lantus daily. 100 each 11  . lisinopril (ZESTRIL) 10 MG tablet Take 1  tablet (10 mg total) by mouth daily. 30 tablet 6  . olopatadine (PATANOL) 0.1 % ophthalmic solution Place 1 drop into both eyes 2 (two) times daily. 5 mL 2  . acetaminophen (TYLENOL) 500 MG tablet Take 1 tablet (500 mg total) by mouth every 6 (six) hours as needed. (Patient not taking: Reported on 02/11/2020) 30 tablet 0  . albuterol (PROVENTIL HFA;VENTOLIN HFA) 108 (90 Base) MCG/ACT inhaler Inhale 1-2 puffs into the lungs every 6 (six) hours as needed for wheezing or shortness of breath. (Patient not taking: Reported on 02/11/2020) 54 g 3  . diclofenac Sodium (VOLTAREN) 1 % GEL Apply 2g QID x 7 days to affected area in left hip for pain (Patient not taking: Reported on 02/11/2020) 150 g 0  . ibuprofen (ADVIL) 600 MG tablet Take 1 tablet (600 mg total) by mouth every 6 (six) hours as needed. (Patient not taking: Reported on 02/11/2020) 30 tablet 0  . predniSONE (DELTASONE) 20 MG tablet 3 tabs po day one, then 2 tabs daily x 4 days (Patient not taking: Reported on 02/11/2020) 11 tablet 0  . promethazine (PHENERGAN) 25 MG tablet Take 1 tablet (25 mg total) by mouth every 8 (eight) hours as needed for nausea or vomiting. (Patient not taking: Reported on 09/16/2020) 20 tablet 0  . SUMAtriptan (IMITREX) 25 MG tablet Take 1 tablet (25 mg total) by mouth once for 1 dose. Please take 1 tablet at outset of headache.May repeat in 2 hours if headache persists or recurs. (Patient not taking: Reported on 09/16/2020) 20 tablet 1   . topiramate (TOPAMAX) 50 MG tablet Take 1 tablet (50 mg total) by mouth 2 (two) times daily. (Patient not taking: Reported on 02/11/2020) 60 tablet 3  . traZODone (DESYREL) 100 MG tablet Take 1 tablet (100 mg total) by mouth at bedtime as needed for sleep. (Patient not taking: Reported on 09/16/2020) 30 tablet 3  . pantoprazole (PROTONIX) 40 MG tablet Take 1 tablet (40 mg total) by mouth daily. (Patient not taking: Reported on 09/16/2020) 30 tablet 6   Facility-Administered Medications Prior to Visit  Medication Dose Route Frequency Provider Last Rate Last Admin  . 0.9 %  sodium chloride infusion  500 mL Intravenous Continuous Gatha Mayer, MD        Allergies  Allergen Reactions  . Gadolinium Derivatives Nausea And Vomiting    Code: VOM, Desc: Pt began vomiting 45 sec after MRI contrast injection of Multihance, Onset Date: [id number]    . Iodinated Diagnostic Agents Nausea And Vomiting    ROS Review of Systems  Constitutional: Positive for fatigue. Negative for chills and fever.  HENT: Negative for sore throat and trouble swallowing.   Eyes: Positive for photophobia (with headaches). Negative for visual disturbance.  Respiratory: Positive for cough (feels due to reflux). Negative for shortness of breath.   Cardiovascular: Positive for palpitations (episodes of increased HR). Negative for chest pain.  Gastrointestinal: Positive for abdominal pain, nausea and vomiting. Negative for constipation and diarrhea.  Endocrine: Positive for polydipsia and polyuria. Negative for polyphagia.  Genitourinary: Positive for dysuria and frequency.  Musculoskeletal: Positive for back pain.  Skin: Negative for rash and wound.  Neurological: Positive for dizziness, weakness and headaches.  Hematological: Negative for adenopathy. Does not bruise/bleed easily.      Objective:    Physical Exam Constitutional:      General: She is in acute distress (initially holding her head and complained of  headache).     Appearance: Normal appearance. She  is obese.  Neck:     Vascular: No carotid bruit.  Cardiovascular:     Rate and Rhythm: Normal rate and regular rhythm.  Pulmonary:     Effort: Pulmonary effort is normal.     Breath sounds: Normal breath sounds.  Abdominal:     Palpations: Abdomen is soft.     Tenderness: There is abdominal tenderness. There is no right CVA tenderness, left CVA tenderness, guarding or rebound.     Comments: Mid right upper quadrant and epigastric tenderness to palpation; suprapubic tenderness to palpation  Musculoskeletal:     Cervical back: Normal range of motion and neck supple. No rigidity or tenderness.  Lymphadenopathy:     Cervical: No cervical adenopathy.  Neurological:     Mental Status: She is alert.     BP (!) 155/67 (BP Location: Right Arm, Patient Position: Sitting)   Pulse 65   Temp (!) 97.2 F (36.2 C)   Wt 157 lb 3.2 oz (71.3 kg)   SpO2 97%   BMI 30.70 kg/m  Wt Readings from Last 3 Encounters:  09/16/20 157 lb 3.2 oz (71.3 kg)  09/14/20 165 lb 5.5 oz (75 kg)  02/11/20 164 lb (74.4 kg)     Health Maintenance Due  Topic Date Due  . PNEUMOCOCCAL POLYSACCHARIDE VACCINE AGE 34-64 HIGH RISK  Never done  . OPHTHALMOLOGY EXAM  Never done  . COVID-19 Vaccine (1) Never done  . FOOT EXAM  08/16/2019  . MAMMOGRAM  01/26/2020  . INFLUENZA VACCINE  06/21/2020  . HEMOGLOBIN A1C  08/13/2020     Lab Results  Component Value Date   TSH 1.500 02/11/2020   Lab Results  Component Value Date   WBC 5.8 09/14/2020   HGB 14.5 09/14/2020   HCT 43.2 09/14/2020   MCV 88.9 09/14/2020   PLT 138 (L) 09/14/2020   Lab Results  Component Value Date   NA 139 09/16/2020   K 4.6 09/16/2020   CHLORIDE 104 09/30/2013   CO2 22 09/16/2020   GLUCOSE 285 (H) 09/16/2020   BUN 12 09/16/2020   CREATININE 0.75 09/16/2020   BILITOT 1.0 09/16/2020   ALKPHOS 117 09/16/2020   AST 67 (H) 09/16/2020   ALT 45 (H) 09/16/2020   PROT 7.2 09/16/2020    ALBUMIN 4.0 09/16/2020   CALCIUM 9.1 09/16/2020   ANIONGAP 10 09/14/2020   GFR 84.64 10/29/2019   Lab Results  Component Value Date   CHOL 106 02/11/2020   Lab Results  Component Value Date   HDL 47 02/11/2020   Lab Results  Component Value Date   LDLCALC 37 02/11/2020   Lab Results  Component Value Date   TRIG 125 02/11/2020   Lab Results  Component Value Date   CHOLHDL 2.3 02/11/2020   Lab Results  Component Value Date   HGBA1C 8.4 (A) 02/11/2020      Assessment & Plan:  1. Type 2 diabetes mellitus with diabetic neuropathy, with long-term current use of insulin ([place]); 7.  Encounter for examination following treatment at hospital Discussed importance of monitoring blood sugars, remaining compliant with medications and following diabetic diet.  Patient will have comprehensive metabolic panel, lipase and T4/TSH at today's visit.  Educational material on diabetes basics provided to the patient and patient's results from her emergency department visits prior to today's appointment were discussed with the patient. - Comprehensive metabolic panel - Lipase - T4 AND TSH  2. Chronic migraine without aura without status migrainosus, not intractable; 7.  Encounter for  examination following treatment at hospital Prescription provided for patient to try rizatriptan 5 mg as needed for migraine headaches along with Zofran as needed for nausea associated with migraine headaches.  She may need increase in dose of rizatriptan if she does not get adequate relief with initial 5 mg dose.  She is encouraged to keep close follow-up of both her diabetes and migraines with her primary care provider. - rizatriptan (MAXALT) 5 MG tablet; Take 1 tablet (5 mg total) by mouth as needed for migraine. May repeat in 2 hours if needed  Dispense: 10 tablet; Refill: 3 - ondansetron (ZOFRAN) 4 MG tablet; Take 1 tablet (4 mg total) by mouth every 8 (eight) hours as needed for nausea or vomiting.  Dispense: 20  tablet; Refill: 0  3. Epigastric pain; 4.  Nausea; 5.  Gastroesophageal reflux disease, esophagitis presence not specified Patient with complaint of recurrent nausea and has epigastric pain on exam.  She will have comprehensive metabolic panel to look for electrolyte or liver abnormality the patient per ED notes has known hepatic cirrhosis.  Patient will have lipase that she has also had issues with uncontrolled diabetes in addition to her epigastric pain.  She will be placed on pantoprazole 40 mg twice daily to reduce stomach acid and will have H. pylori antibody blood work.  She has been referred to gastroenterology for further evaluation and treatment.  Prescription provided for Zofran to help with nausea. - Comprehensive metabolic panel - Lipase - pantoprazole (PROTONIX) 40 MG tablet; Take 1 tablet (40 mg total) by mouth 2 (two) times daily. To reduce stomach acid  Dispense: 60 tablet; Refill: 4 - Ambulatory referral to Gastroenterology - H Pylori, IGM, IGG, IGA AB - CBC with Differential   5. Urinary frequency Patient with urinary frequency and urine will be sent for culture to look for possible urinary tract infection.  Patient also has had issues with uncontrolled diabetes/hyperglycemia which may also be contributing to or or causing her urinary frequency. - Comprehensive metabolic panel - Urine Culture  6. Gastroesophageal reflux disease, unspecified whether esophagitis present Educational material in Spanish on acid reflux provided to the patient.  New prescription provided for pantoprazole 40 mg twice daily and patient will have testing for H. pylori antibodies.  Referral placed for patient to follow-up with gastroenterology.  He is encouraged to avoid known trigger foods as well as avoidance of late night eating.   Meds ordered this encounter  Medications  . rizatriptan (MAXALT) 5 MG tablet    Sig: Take 1 tablet (5 mg total) by mouth as needed for migraine. May repeat in 2 hours if  needed    Dispense:  10 tablet    Refill:  3  . ondansetron (ZOFRAN) 4 MG tablet    Sig: Take 1 tablet (4 mg total) by mouth every 8 (eight) hours as needed for nausea or vomiting.    Dispense:  20 tablet    Refill:  0  . pantoprazole (PROTONIX) 40 MG tablet    Sig: Take 1 tablet (40 mg total) by mouth 2 (two) times daily. To reduce stomach acid    Dispense:  60 tablet    Refill:  4     Follow-up: Return for abdominal pain, DM, headaches: 2 or weeks with PCP-[facility].    Antony Blackbird, MD

## 2020-09-17 LAB — COMPREHENSIVE METABOLIC PANEL WITH GFR
ALT: 45 IU/L — ABNORMAL HIGH (ref 0–32)
AST: 67 IU/L — ABNORMAL HIGH (ref 0–40)
Albumin/Globulin Ratio: 1.3 (ref 1.2–2.2)
Albumin: 4 g/dL (ref 3.8–4.8)
Alkaline Phosphatase: 117 IU/L (ref 44–121)
BUN/Creatinine Ratio: 16 (ref 12–28)
BUN: 12 mg/dL (ref 8–27)
Bilirubin Total: 1 mg/dL (ref 0.0–1.2)
CO2: 22 mmol/L (ref 20–29)
Calcium: 9.1 mg/dL (ref 8.7–10.3)
Chloride: 105 mmol/L (ref 96–106)
Creatinine, Ser: 0.75 mg/dL (ref 0.57–1.00)
GFR calc Af Amer: 98 mL/min/1.73
GFR calc non Af Amer: 85 mL/min/1.73
Globulin, Total: 3.2 g/dL (ref 1.5–4.5)
Glucose: 285 mg/dL — ABNORMAL HIGH (ref 65–99)
Potassium: 4.6 mmol/L (ref 3.5–5.2)
Sodium: 139 mmol/L (ref 134–144)
Total Protein: 7.2 g/dL (ref 6.0–8.5)

## 2020-09-17 LAB — LIPASE: Lipase: 49 U/L (ref 14–72)

## 2020-09-18 LAB — URINE CULTURE

## 2020-09-29 MED FILL — ?GLIPIZIDE 10 MG TABLET: 10 | 30 days supply | Qty: 60 | Fill #4

## 2020-09-29 MED FILL — CARVEDILOL 12.5 MG TABLET: 12.5 | 30 days supply | Qty: 60 | Fill #3

## 2020-09-29 MED FILL — LISINOPRIL 10 MG TABS: 10 | 30 days supply | Qty: 30 | Fill #3

## 2020-10-30 ENCOUNTER — Other Ambulatory Visit: Payer: Self-pay | Admitting: Family Medicine

## 2020-10-30 DIAGNOSIS — E1140 Type 2 diabetes mellitus with diabetic neuropathy, unspecified: Secondary | ICD-10-CM

## 2020-10-30 MED FILL — LISINOPRIL 10 MG TABS: 10 | 30 days supply | Qty: 30 | Fill #4

## 2020-10-30 MED FILL — CARVEDILOL 12.5 MG TABLET: 12.5 | 30 days supply | Qty: 60 | Fill #4

## 2020-10-30 MED FILL — TRUEplus 5-BEVEL PEN NEEDLE: 32G X 4 MM | 30 days supply | Qty: 100 | Fill #3

## 2020-10-30 MED FILL — glipiZIDE 10 MG TABS: 10 | 30 days supply | Qty: 60 | Fill #5

## 2020-10-30 MED FILL — ?ATORVASTATIN 40MG TABLET: 40 | 30 days supply | Qty: 30 | Fill #3

## 2020-11-05 ENCOUNTER — Ambulatory Visit: Payer: Self-pay | Admitting: Internal Medicine

## 2020-11-27 MED FILL — CARVEDILOL 12.5 MG TABLET: 12.5 | 30 days supply | Qty: 60 | Fill #5

## 2020-11-27 MED FILL — glipiZIDE 10 MG TABS: 10 | 30 days supply | Qty: 60 | Fill #6

## 2020-11-27 MED FILL — LISINOPRIL 10 MG TABS: 10 | 30 days supply | Qty: 30 | Fill #5

## 2020-11-27 MED FILL — ATORVASTATIN CALCIUM 40 MG: 40 | 30 days supply | Qty: 30 | Fill #4

## 2020-11-27 MED FILL — $LANTUS SOLOSTAR 100 UNITS/: 100 | 31 days supply | Qty: 18 | Fill #0

## 2020-12-01 ENCOUNTER — Ambulatory Visit: Payer: Self-pay | Attending: Family Medicine | Admitting: Family Medicine

## 2020-12-01 ENCOUNTER — Other Ambulatory Visit: Payer: Self-pay

## 2020-12-01 ENCOUNTER — Other Ambulatory Visit: Payer: Self-pay | Admitting: Family Medicine

## 2020-12-01 ENCOUNTER — Encounter: Payer: Self-pay | Admitting: Family Medicine

## 2020-12-01 DIAGNOSIS — I10 Essential (primary) hypertension: Secondary | ICD-10-CM

## 2020-12-01 DIAGNOSIS — E1140 Type 2 diabetes mellitus with diabetic neuropathy, unspecified: Secondary | ICD-10-CM

## 2020-12-01 DIAGNOSIS — Z794 Long term (current) use of insulin: Secondary | ICD-10-CM

## 2020-12-01 DIAGNOSIS — N898 Other specified noninflammatory disorders of vagina: Secondary | ICD-10-CM

## 2020-12-01 DIAGNOSIS — E7800 Pure hypercholesterolemia, unspecified: Secondary | ICD-10-CM

## 2020-12-01 DIAGNOSIS — K219 Gastro-esophageal reflux disease without esophagitis: Secondary | ICD-10-CM

## 2020-12-01 DIAGNOSIS — G4700 Insomnia, unspecified: Secondary | ICD-10-CM

## 2020-12-01 DIAGNOSIS — G43709 Chronic migraine without aura, not intractable, without status migrainosus: Secondary | ICD-10-CM

## 2020-12-01 MED ORDER — CARVEDILOL 12.5 MG PO TABS: 12.5000 mg | ORAL_TABLET | Freq: Two times a day (BID) | ORAL | 6 refills | Status: DC

## 2020-12-01 MED ORDER — RIZATRIPTAN BENZOATE 5 MG PO TABS: 5.0000 mg | ORAL_TABLET | ORAL | 3 refills | Status: DC | PRN

## 2020-12-01 MED ORDER — LISINOPRIL 10 MG PO TABS: 10.0000 mg | ORAL_TABLET | Freq: Every day | ORAL | 6 refills | Status: DC

## 2020-12-01 MED ORDER — TRAZODONE HCL 100 MG PO TABS: 100.0000 mg | ORAL_TABLET | Freq: Every evening | ORAL | 3 refills | Status: DC | PRN

## 2020-12-01 MED ORDER — ATORVASTATIN CALCIUM 40 MG PO TABS: 40.0000 mg | ORAL_TABLET | Freq: Every day | ORAL | 6 refills | Status: DC

## 2020-12-01 MED ORDER — METRONIDAZOLE 0.75 % VA GEL: 1.0000 | Freq: Every day | VAGINAL | 0 refills | Status: DC

## 2020-12-01 MED ORDER — TOPIRAMATE 50 MG PO TABS: 50.0000 mg | ORAL_TABLET | Freq: Two times a day (BID) | ORAL | 3 refills | Status: DC

## 2020-12-01 MED ORDER — PANTOPRAZOLE SODIUM 40 MG PO TBEC: 40.0000 mg | DELAYED_RELEASE_TABLET | Freq: Two times a day (BID) | ORAL | 4 refills | Status: DC

## 2020-12-01 MED ORDER — LANTUS SOLOSTAR 100 UNIT/ML ~~LOC~~ SOPN
32.0000 [IU] | PEN_INJECTOR | Freq: Two times a day (BID) | SUBCUTANEOUS | 6 refills | Status: DC
  Filled 2021-03-08: qty 30, 47d supply, fill #0

## 2020-12-01 MED FILL — METRONIDAZOLE 0.75 % GEL: 0.75 | 7 days supply | Qty: 70 | Fill #0

## 2020-12-01 MED FILL — RIZATRIPTAN 5 MG TABLET: 5 | 30 days supply | Qty: 10 | Fill #0

## 2020-12-01 MED FILL — PANTOPRAZOLE SOD DR 40 MG T: 40 | 30 days supply | Qty: 60 | Fill #0

## 2020-12-01 MED FILL — TOPIRAMATE 50 MG TABLET: 50 | 30 days supply | Qty: 60 | Fill #0

## 2020-12-01 NOTE — Progress Notes (Signed)
Virtual Visit via Telephone Note  I connected with Brandi Bryant, on 12/01/2020 at 11:14 AM by telephone due to the COVID-19 pandemic and verified that I am speaking with the correct person using two identifiers.   Consent: I discussed the limitations, risks, security and privacy concerns of performing an evaluation and management service by telephone and the availability of in person appointments. I also discussed with the patient that there may be a patient responsible charge related to this service. The patient expressed understanding and agreed to proceed.   Location of Patient: Home  Location of Provider: Clinic   Persons participating in Telemedicine visit: [facility] Dr. Margarita Rana Interpreter Id [place]     History of Present Illness: Brandi Bryant is a 65 year old female with a history of GERD, hypertension, hyperlipidemia, type 2 diabetes mellitus (A1c 8.4) previous CVA, TIA in 03/2018 here foran acute visit.  Her migraines have reduced in frequency but occurs sometimes at night and can be trigerred by skipping meals.Migraines occur 3-4 x/week and is associated with photophobia, phonophobia, nausea. Of note her last prescription for Topamax is from 10/2019 She continues to have insomnia which is uncontrolled on Trazodone; she takes it around 10pm but does not fall asleep till about 5 am. She sometimes drinks coffee very early in the morning.  With regards to her Diabetes she has not been compliant with her Lantus as she was out for 1 month.She has not been checking her blood sugars regularly. She complains of vaginal discharge with a foul smell and denies vaginal itching.  Past Medical History:  Diagnosis Date  . Abdominal pain   . Allergy   . Arthralgia 08/13/2013  . Bell palsy 2006  . Bell's palsy   . Breast cancer ([place]) 2009   Left Breast Cancer  . Cataract   . Chest pain 09/30/2013  . Cholelithiasis    . Colon cancer screening 10/27/2015  . Complication of anesthesia    " tubal ligation, in [place]"  had weakness, couldnt stand up the next day, was given pills for oxygen for Brain."  1 month after I was having loss of attentiviness, still have to focus  carefully.   . Coronary artery disease   . Depression   . Diabetes mellitus    Type II  . Dyspnea    at times- when air conditioner is runnimg, heat also   . Encounter for screening colonoscopy 10/30/2015  . GERD (gastroesophageal reflux disease)   . Hepatic steatosis   . History of breast cancer 06/01/2012  . HTN (hypertension)   . Hx of adenomatous polyp of colon 11/07/2019  . Hyperlipidemia   . Personal history of chemotherapy 2009   Left Breast Cancer  . Personal history of radiation therapy 2009   Left Breast Cancer  . Stroke [facility])    2006, 2015   Allergies  Allergen Reactions  . Gadolinium Derivatives Nausea And Vomiting    Code: VOM, Desc: Pt began vomiting 45 sec after MRI contrast injection of Multihance, Onset Date: [id number]    . Iodinated Diagnostic Agents Nausea And Vomiting    Current Outpatient Medications on File Prior to Visit  Medication Sig Dispense Refill  . acetaminophen (TYLENOL) 500 MG tablet Take 1 tablet (500 mg total) by mouth every 6 (six) hours as needed. (Patient not taking: No sig reported) 30 tablet 0  . albuterol (PROVENTIL HFA;VENTOLIN HFA) 108 (90 Base) MCG/ACT inhaler Inhale 1-2 puffs into the lungs every 6 (six) hours as  needed for wheezing or shortness of breath. (Patient not taking: No sig reported) 54 g 3  . aspirin 325 MG EC tablet Take 1 tablet (325 mg total) by mouth daily. 30 tablet 6  . atorvastatin (LIPITOR) 40 MG tablet Take 1 tablet (40 mg total) by mouth daily. 30 tablet 6  . carvedilol (COREG) 12.5 MG tablet Take 1 tablet (12.5 mg total) by mouth 2 (two) times daily with a meal. 60 tablet 6  . diclofenac Sodium (VOLTAREN) 1 % GEL Apply 2g QID x 7 days to affected area in left hip  for pain (Patient not taking: No sig reported) 150 g 0  . glipiZIDE (GLUCOTROL) 10 MG tablet Take 1 tablet (10 mg total) by mouth 2 (two) times daily before a meal. 60 tablet 6  . ibuprofen (ADVIL) 600 MG tablet Take 1 tablet (600 mg total) by mouth every 6 (six) hours as needed. (Patient not taking: No sig reported) 30 tablet 0  . insulin glargine (LANTUS SOLOSTAR) 100 UNIT/ML Solostar Pen Inject 28 Units into the skin 2 (two) times daily. 30 mL 6  . Insulin Pen Needle (TRUEPLUS 5-BEVEL PEN NEEDLES) 32G X 4 MM MISC Use to inject Lantus daily. 100 each 11  . lisinopril (ZESTRIL) 10 MG tablet Take 1 tablet (10 mg total) by mouth daily. 30 tablet 6  . olopatadine (PATANOL) 0.1 % ophthalmic solution Place 1 drop into both eyes 2 (two) times daily. 5 mL 2  . ondansetron (ZOFRAN) 4 MG tablet Take 1 tablet (4 mg total) by mouth every 8 (eight) hours as needed for nausea or vomiting. 20 tablet 0  . pantoprazole (PROTONIX) 40 MG tablet Take 1 tablet (40 mg total) by mouth 2 (two) times daily. To reduce stomach acid 60 tablet 4  . predniSONE (DELTASONE) 20 MG tablet 3 tabs po day one, then 2 tabs daily x 4 days (Patient not taking: No sig reported) 11 tablet 0  . promethazine (PHENERGAN) 25 MG tablet Take 1 tablet (25 mg total) by mouth every 8 (eight) hours as needed for nausea or vomiting. (Patient not taking: No sig reported) 20 tablet 0  . rizatriptan (MAXALT) 5 MG tablet Take 1 tablet (5 mg total) by mouth as needed for migraine. May repeat in 2 hours if needed 10 tablet 3  . SUMAtriptan (IMITREX) 25 MG tablet Take 1 tablet (25 mg total) by mouth once for 1 dose. Please take 1 tablet at outset of headache.May repeat in 2 hours if headache persists or recurs. (Patient not taking: Reported on 09/16/2020) 20 tablet 1  . topiramate (TOPAMAX) 50 MG tablet Take 1 tablet (50 mg total) by mouth 2 (two) times daily. (Patient not taking: No sig reported) 60 tablet 3  . traZODone (DESYREL) 100 MG tablet Take 1 tablet  (100 mg total) by mouth at bedtime as needed for sleep. (Patient not taking: No sig reported) 30 tablet 3  . [DISCONTINUED] escitalopram (LEXAPRO) 10 MG tablet Take 10 mg by mouth daily.     Current Facility-Administered Medications on File Prior to Visit  Medication Dose Route Frequency Provider Last Rate Last Admin  . 0.9 %  sodium chloride infusion  500 mL Intravenous Continuous Gatha Mayer, MD        Observations/Objective: Awake, alert, oriented x3 Not in acute distress  CMP Latest Ref Rng & Units 09/16/2020 09/14/2020 09/09/2020  Glucose 65 - 99 mg/dL 285(H) 242(H) -  BUN 8 - 27 mg/dL 12 13 -  Creatinine 0.57 -  1.00 mg/dL 0.75 0.75 -  Sodium 134 - 144 mmol/L 139 137 -  Potassium 3.5 - 5.2 mmol/L 4.6 4.3 -  Chloride 96 - 106 mmol/L 105 106 -  CO2 20 - 29 mmol/L 22 21(L) -  Calcium 8.7 - 10.3 mg/dL 9.1 9.4 -  Total Protein 6.0 - 8.5 g/dL 7.2 6.9 6.7  Total Bilirubin 0.0 - 1.2 mg/dL 1.0 1.0 1.8(H)  Alkaline Phos 44 - 121 IU/L 117 98 101  AST 0 - 40 IU/L 67(H) 60(H) 61(H)  ALT 0 - 32 IU/L 45(H) 41 41    Lipid Panel     Component Value Date/Time   CHOL 106 02/11/2020 1501   TRIG 125 02/11/2020 1501   HDL 47 02/11/2020 1501   CHOLHDL 2.3 02/11/2020 1501   CHOLHDL 2.6 03/31/2018 0348   VLDL 33 03/31/2018 0348   LDLCALC 37 02/11/2020 1501   LABVLDL 22 02/11/2020 1501    Lab Results  Component Value Date   HGBA1C 8.4 (A) 02/11/2020    Assessment and Plan: 1. Chronic migraine without aura without status migrainosus, not intractable Uncontrolled due to being off preventive medication Refilled Topamax - rizatriptan (MAXALT) 5 MG tablet; Take 1 tablet (5 mg total) by mouth as needed for migraine. May repeat in 2 hours if needed  Dispense: 10 tablet; Refill: 3  2. Gastroesophageal reflux disease without esophagitis Controlled - pantoprazole (PROTONIX) 40 MG tablet; Take 1 tablet (40 mg total) by mouth 2 (two) times daily. To reduce stomach acid  Dispense: 60 tablet;  Refill: 4  3. Essential hypertension Controlled Counseled on blood pressure goal of less than 130/80, low-sodium, DASH diet, medication compliance, 150 minutes of moderate intensity exercise per week. Discussed medication compliance, adverse effects. - lisinopril (ZESTRIL) 10 MG tablet; Take 1 tablet (10 mg total) by mouth daily.  Dispense: 30 tablet; Refill: 6 - carvedilol (COREG) 12.5 MG tablet; Take 1 tablet (12.5 mg total) by mouth 2 (two) times daily with a meal.  Dispense: 60 tablet; Refill: 6  4. Type 2 diabetes mellitus with diabetic neuropathy, with long-term current use of insulin (HCC) Uncontrolled with A1c 8.4 Anticipate poor control with repeat A1c to due to running out of Lantus which I have refilled No regimen changes today Counseled on Diabetic diet, my plate method, 132 minutes of moderate intensity exercise/week Blood sugar logs with fasting goals of 80-120 mg/dl, random of less than 180 and in the event of sugars less than 60 mg/dl or greater than 400 mg/dl encouraged to notify the clinic. Advised on the need for annual eye exams, annual foot exams, Pneumonia vaccine. - CMP14+EGFR; Future - Lipid panel; Future - Microalbumin / creatinine urine ratio; Future - Hemoglobin A1c; Future - insulin glargine (LANTUS SOLOSTAR) 100 UNIT/ML Solostar Pen; Inject 32 Units into the skin 2 (two) times daily.  Dispense: 30 mL; Refill: 6  5. Pure hypercholesterolemia Controlled Will check Lipid panel tomorrow when she is fasting - atorvastatin (LIPITOR) 40 MG tablet; Take 1 tablet (40 mg total) by mouth daily.  Dispense: 30 tablet; Refill: 6  6. Insomnia, unspecified type Uncontrolled Advised to take medication early in the eveing - traZODone (DESYREL) 100 MG tablet; Take 1 tablet (100 mg total) by mouth at bedtime as needed for sleep.  Dispense: 30 tablet; Refill: 3  7. Vaginal discharge - metroNIDAZOLE (METROGEL VAGINAL) 0.75 % vaginal gel; Place 1 Applicatorful vaginally at  bedtime.  Dispense: 70 g; Refill: 0   Follow Up Instructions: 3 months   I  discussed the assessment and treatment plan with the patient. The patient was provided an opportunity to ask questions and all were answered. The patient agreed with the plan and demonstrated an understanding of the instructions.   The patient was advised to call back or seek an in-person evaluation if the symptoms worsen or if the condition fails to improve as anticipated.     I provided 24 minutes total of non-face-to-face time during this encounter including median intraservice time, reviewing previous notes, investigations, ordering medications, medical decision making, coordinating care and patient verbalized understanding at the end of the visit.     Charlott Rakes, MD, FAAFP. [facility] [place], [place]   12/01/2020, 11:14 AM

## 2020-12-01 NOTE — Progress Notes (Signed)
Needs refills on medications. 

## 2020-12-02 ENCOUNTER — Ambulatory Visit: Payer: Self-pay | Attending: Family Medicine

## 2020-12-02 ENCOUNTER — Other Ambulatory Visit: Payer: Self-pay

## 2020-12-02 DIAGNOSIS — Z794 Long term (current) use of insulin: Secondary | ICD-10-CM

## 2020-12-02 DIAGNOSIS — E1140 Type 2 diabetes mellitus with diabetic neuropathy, unspecified: Secondary | ICD-10-CM

## 2020-12-02 MED FILL — TRAZODONE HCL 100 MG TABS: 100 | 30 days supply | Qty: 30 | Fill #0

## 2020-12-03 LAB — LIPID PANEL
Chol/HDL Ratio: 2.8 ratio (ref 0.0–4.4)
Cholesterol, Total: 113 mg/dL (ref 100–199)
HDL: 41 mg/dL (ref >39)
LDL Chol Calc (NIH): 52 mg/dL (ref 0–99)
Triglycerides: 107 mg/dL (ref 0–149)
VLDL Cholesterol Cal: 20 mg/dL (ref 5–40)

## 2020-12-03 LAB — CMP14+EGFR
ALT: 33 IU/L — ABNORMAL HIGH (ref 0–32)
AST: 40 IU/L (ref 0–40)
Albumin/Globulin Ratio: 1.2 (ref 1.2–2.2)
Albumin: 3.7 g/dL — ABNORMAL LOW (ref 3.8–4.8)
Alkaline Phosphatase: 96 IU/L (ref 44–121)
BUN/Creatinine Ratio: 21 (ref 12–28)
BUN: 16 mg/dL (ref 8–27)
Bilirubin Total: 0.8 mg/dL (ref 0.0–1.2)
CO2: 22 mmol/L (ref 20–29)
Calcium: 9.1 mg/dL (ref 8.7–10.3)
Chloride: 108 mmol/L — ABNORMAL HIGH (ref 96–106)
Creatinine, Ser: 0.78 mg/dL (ref 0.57–1.00)
GFR calc Af Amer: 94 mL/min/{1.73_m2} (ref >59)
GFR calc non Af Amer: 81 mL/min/{1.73_m2} (ref >59)
Globulin, Total: 3.2 g/dL (ref 1.5–4.5)
Glucose: 116 mg/dL — ABNORMAL HIGH (ref 65–99)
Potassium: 4.3 mmol/L (ref 3.5–5.2)
Sodium: 145 mmol/L — ABNORMAL HIGH (ref 134–144)
Total Protein: 6.9 g/dL (ref 6.0–8.5)

## 2020-12-03 LAB — MICROALBUMIN / CREATININE URINE RATIO
Creatinine, Urine: 99.9 mg/dL
Microalb/Creat Ratio: 8 mg/g creat (ref 0–29)
Microalbumin, Urine: 8.1 ug/mL

## 2020-12-03 LAB — HEMOGLOBIN A1C
Est. average glucose Bld gHb Est-mCnc: 169 mg/dL
Hgb A1c MFr Bld: 7.5 % — ABNORMAL HIGH (ref 4.8–5.6)

## 2020-12-07 ENCOUNTER — Telehealth: Payer: Self-pay

## 2020-12-07 NOTE — Telephone Encounter (Signed)
[employer] interpreters  jose Id# [phone]  contacted pt to go over lab results   pt didn't answer and was unable to lvm due to vm not being set up.

## 2020-12-28 ENCOUNTER — Other Ambulatory Visit: Payer: Self-pay | Admitting: Physician Assistant

## 2020-12-28 DIAGNOSIS — E1140 Type 2 diabetes mellitus with diabetic neuropathy, unspecified: Secondary | ICD-10-CM

## 2020-12-28 DIAGNOSIS — Z794 Long term (current) use of insulin: Secondary | ICD-10-CM

## 2020-12-28 MED FILL — TOPIRAMATE 50 MG TABLET: 50 | 30 days supply | Qty: 60 | Fill #0

## 2020-12-28 MED FILL — PANTOPRAZOLE SOD DR 40 MG T: 40 | 30 days supply | Qty: 60 | Fill #0

## 2020-12-28 MED FILL — ATORVASTATIN CALCIUM 40 MG: 40 | 30 days supply | Qty: 30 | Fill #0

## 2020-12-28 MED FILL — TRAZODONE HCL 100 MG TABS: 100 | 30 days supply | Qty: 30 | Fill #0

## 2020-12-28 MED FILL — RIZATRIPTAN 5 MG TABLET: 5 | 30 days supply | Qty: 10 | Fill #0

## 2020-12-28 MED FILL — $LANTUS SOLOSTAR 100 UNITS/: 100 | 42 days supply | Qty: 27 | Fill #0

## 2020-12-28 MED FILL — CARVEDILOL 12.5 MG TABLET: 12.5 | 30 days supply | Qty: 60 | Fill #0

## 2020-12-28 MED FILL — LISINOPRIL 10 MG TABS: 10 | 30 days supply | Qty: 30 | Fill #0

## 2020-12-28 MED FILL — METRONIDAZOLE 0.75 % GEL: 0.75 | 7 days supply | Qty: 70 | Fill #0

## 2020-12-30 ENCOUNTER — Other Ambulatory Visit: Payer: Self-pay | Admitting: Internal Medicine

## 2020-12-31 MED FILL — glipiZIDE 10 MG TABS: 10 | 30 days supply | Qty: 60 | Fill #0

## 2021-01-08 MED FILL — glipiZIDE 10 MG TABS: 10 | 30 days supply | Qty: 60 | Fill #0

## 2021-02-08 MED FILL — CARVEDILOL 12.5 MG TABLET: 12.5 | 30 days supply | Qty: 60 | Fill #1

## 2021-02-08 MED FILL — LISINOPRIL 10 MG TABS: 10 | 30 days supply | Qty: 30 | Fill #1

## 2021-02-08 MED FILL — RIZATRIPTAN 5 MG TABLET: 5 | 30 days supply | Qty: 10 | Fill #1

## 2021-02-08 MED FILL — glipiZIDE 10 MG TABS: 10 | 30 days supply | Qty: 60 | Fill #1

## 2021-02-08 MED FILL — $LANTUS SOLOSTAR 100 UNITS/: 100 | 42 days supply | Qty: 27 | Fill #1

## 2021-03-08 ENCOUNTER — Other Ambulatory Visit: Payer: Self-pay

## 2021-03-08 ENCOUNTER — Other Ambulatory Visit: Payer: Self-pay | Admitting: Internal Medicine

## 2021-03-08 ENCOUNTER — Other Ambulatory Visit: Payer: Self-pay | Admitting: Family Medicine

## 2021-03-08 DIAGNOSIS — Z794 Long term (current) use of insulin: Secondary | ICD-10-CM

## 2021-03-08 DIAGNOSIS — E1140 Type 2 diabetes mellitus with diabetic neuropathy, unspecified: Secondary | ICD-10-CM

## 2021-03-08 MED ORDER — TRUEPLUS PEN NEEDLES 32G X 4 MM MISC
2 refills | Status: DC
  Filled 2021-03-08: qty 100, 90d supply, fill #0
  Filled 2021-04-07: qty 100, 25d supply, fill #1
  Filled 2022-01-26: qty 100, 25d supply, fill #0

## 2021-03-08 MED FILL — Lisinopril Tab 10 MG: ORAL | 30 days supply | Qty: 30 | Fill #0 | Status: AC

## 2021-03-08 MED FILL — Atorvastatin Calcium Tab 40 MG (Base Equivalent): ORAL | 30 days supply | Qty: 30 | Fill #0 | Status: AC

## 2021-03-08 MED FILL — Carvedilol Tab 12.5 MG: ORAL | 30 days supply | Qty: 60 | Fill #0 | Status: AC

## 2021-03-08 NOTE — Telephone Encounter (Signed)
Patient PCP: Margarita Rana- last visit 3 months ago- Rx RF 3 months

## 2021-03-10 ENCOUNTER — Other Ambulatory Visit: Payer: Self-pay

## 2021-03-10 MED ORDER — GLIPIZIDE 10 MG PO TABS
ORAL_TABLET | Freq: Two times a day (BID) | ORAL | 0 refills | Status: DC
  Filled 2021-03-10 – 2021-03-18 (×2): qty 60, 30d supply, fill #0

## 2021-03-17 ENCOUNTER — Other Ambulatory Visit: Payer: Self-pay

## 2021-03-18 ENCOUNTER — Other Ambulatory Visit: Payer: Self-pay

## 2021-04-07 ENCOUNTER — Other Ambulatory Visit: Payer: Self-pay

## 2021-04-07 ENCOUNTER — Other Ambulatory Visit: Payer: Self-pay | Admitting: Family Medicine

## 2021-04-07 MED ORDER — GLIPIZIDE 10 MG PO TABS
ORAL_TABLET | Freq: Two times a day (BID) | ORAL | 0 refills | Status: DC
  Filled 2021-04-07: qty 60, 30d supply, fill #0

## 2021-04-07 MED FILL — Carvedilol Tab 12.5 MG: ORAL | 30 days supply | Qty: 60 | Fill #1 | Status: AC

## 2021-04-07 MED FILL — Lisinopril Tab 10 MG: ORAL | 30 days supply | Qty: 30 | Fill #1 | Status: AC

## 2021-04-12 ENCOUNTER — Other Ambulatory Visit: Payer: Self-pay

## 2021-04-30 ENCOUNTER — Other Ambulatory Visit: Payer: Self-pay

## 2021-05-06 ENCOUNTER — Other Ambulatory Visit: Payer: Self-pay

## 2021-05-06 ENCOUNTER — Other Ambulatory Visit: Payer: Self-pay | Admitting: Internal Medicine

## 2021-05-06 MED FILL — Lisinopril Tab 10 MG: ORAL | 30 days supply | Qty: 30 | Fill #2 | Status: AC

## 2021-05-06 MED FILL — Carvedilol Tab 12.5 MG: ORAL | 30 days supply | Qty: 60 | Fill #2 | Status: AC

## 2021-05-07 ENCOUNTER — Ambulatory Visit: Payer: Self-pay | Admitting: Nurse Practitioner

## 2021-05-10 ENCOUNTER — Other Ambulatory Visit: Payer: Self-pay

## 2021-05-13 ENCOUNTER — Other Ambulatory Visit: Payer: Self-pay

## 2021-05-13 ENCOUNTER — Ambulatory Visit: Payer: Self-pay | Admitting: Physician Assistant

## 2021-05-13 VITALS — BP 155/73 | HR 60 | Temp 98.2°F | Resp 18 | Ht <= 58 in | Wt 151.0 lb

## 2021-05-13 DIAGNOSIS — E7800 Pure hypercholesterolemia, unspecified: Secondary | ICD-10-CM

## 2021-05-13 DIAGNOSIS — K219 Gastro-esophageal reflux disease without esophagitis: Secondary | ICD-10-CM

## 2021-05-13 DIAGNOSIS — I25118 Atherosclerotic heart disease of native coronary artery with other forms of angina pectoris: Secondary | ICD-10-CM

## 2021-05-13 DIAGNOSIS — I10 Essential (primary) hypertension: Secondary | ICD-10-CM

## 2021-05-13 DIAGNOSIS — Z794 Long term (current) use of insulin: Secondary | ICD-10-CM

## 2021-05-13 DIAGNOSIS — E1140 Type 2 diabetes mellitus with diabetic neuropathy, unspecified: Secondary | ICD-10-CM

## 2021-05-13 DIAGNOSIS — R519 Headache, unspecified: Secondary | ICD-10-CM

## 2021-05-13 DIAGNOSIS — E559 Vitamin D deficiency, unspecified: Secondary | ICD-10-CM

## 2021-05-13 DIAGNOSIS — G4700 Insomnia, unspecified: Secondary | ICD-10-CM

## 2021-05-13 MED ORDER — RIZATRIPTAN BENZOATE 5 MG PO TABS
ORAL_TABLET | ORAL | 3 refills | Status: DC
  Filled 2021-05-13: qty 10, 20d supply, fill #0
  Filled 2021-07-05: qty 10, 20d supply, fill #1
  Filled 2022-01-26 – 2022-03-24 (×2): qty 10, 20d supply, fill #0
  Filled 2022-05-06: qty 10, 20d supply, fill #1

## 2021-05-13 MED ORDER — TOPIRAMATE 50 MG PO TABS
ORAL_TABLET | Freq: Two times a day (BID) | ORAL | 3 refills | Status: DC
  Filled 2021-05-13: qty 60, 30d supply, fill #0

## 2021-05-13 MED ORDER — LANTUS SOLOSTAR 100 UNIT/ML ~~LOC~~ SOPN
32.0000 [IU] | PEN_INJECTOR | Freq: Two times a day (BID) | SUBCUTANEOUS | 6 refills | Status: DC
  Filled 2021-05-13: qty 15, 23d supply, fill #0
  Filled 2021-09-21: qty 15, 23d supply, fill #1

## 2021-05-13 MED ORDER — CARVEDILOL 12.5 MG PO TABS
ORAL_TABLET | Freq: Two times a day (BID) | ORAL | 6 refills | Status: DC
  Filled 2021-05-13: qty 60, fill #0
  Filled 2021-06-03: qty 60, 30d supply, fill #0
  Filled 2021-07-05: qty 60, 30d supply, fill #1
  Filled 2021-07-29: qty 60, 30d supply, fill #2
  Filled 2021-09-21: qty 60, 30d supply, fill #3
  Filled 2021-10-27: qty 60, 30d supply, fill #4
  Filled 2021-11-29: qty 60, 30d supply, fill #0
  Filled 2021-12-28: qty 60, 30d supply, fill #1

## 2021-05-13 MED ORDER — PANTOPRAZOLE SODIUM 40 MG PO TBEC
DELAYED_RELEASE_TABLET | ORAL | 4 refills | Status: DC
  Filled 2021-05-13: qty 60, 30d supply, fill #0

## 2021-05-13 MED ORDER — LISINOPRIL 10 MG PO TABS
ORAL_TABLET | Freq: Every day | ORAL | 6 refills | Status: DC
  Filled 2021-05-13: qty 30, fill #0
  Filled 2021-06-03: qty 30, 30d supply, fill #0
  Filled 2021-07-05: qty 30, 30d supply, fill #1
  Filled 2021-07-29: qty 30, 30d supply, fill #2

## 2021-05-13 MED ORDER — GLIPIZIDE 10 MG PO TABS
ORAL_TABLET | Freq: Two times a day (BID) | ORAL | 1 refills | Status: DC
  Filled 2021-05-13: qty 60, 30d supply, fill #0
  Filled 2021-06-17: qty 60, 30d supply, fill #1

## 2021-05-13 MED ORDER — ATORVASTATIN CALCIUM 40 MG PO TABS
ORAL_TABLET | Freq: Every day | ORAL | 6 refills | Status: DC
  Filled 2021-05-13: qty 30, 30d supply, fill #0

## 2021-05-13 MED ORDER — TRAZODONE HCL 100 MG PO TABS
ORAL_TABLET | Freq: Every evening | ORAL | 3 refills | Status: DC | PRN
  Filled 2021-05-13 – 2022-03-10 (×2): qty 30, 30d supply, fill #0

## 2021-05-13 NOTE — Progress Notes (Signed)
Established Patient Office Visit  Subjective:  Patient ID: Brandi Bryant, female    DOB: 04-Jan-1956  Age: 65 y.o. MRN: [id number]  CC:  Chief Complaint  Patient presents with   Diabetes    Med refills    HPI [facility] presents for medications refills.  States that she has an appointment with her primary care provider in the middle of August but does not want to run out of her medications.  Reports that she continues to have a headache every morning when she wakes up.  States that she does get relief after she is up and moving around.  Describes the headache as frontal, denies photophobia or nausea or vomiting during these headaches.  States that she has been having difficulty sleeping, has a difficult time falling asleep and staying asleep.  Reports that she has used trazodone with relief, but does not take it every night.  States that she will only use the trazodone if it is midnight and she has not fallen asleep yet.  States that she drinks 7-8 bottles of water a day.  States BP at home WNL.  No other concerns today    Past Medical History:  Diagnosis Date   Abdominal pain    Allergy    Arthralgia 08/13/2013   Bell palsy 2006   Bell's palsy    Breast cancer ([place]) 2009   Left Breast Cancer   Cataract    Chest pain 09/30/2013   Cholelithiasis    Chronic sinusitis 05/16/2017   Colon cancer screening 01/0/9323   Complication of anesthesia    " tubal ligation, in [place]"  had weakness, couldnt stand up the next day, was given pills for oxygen for Brain."  1 month after I was having loss of attentiviness, still have to focus  carefully.    Coronary artery disease    CVA (cerebral vascular accident) ([place]) 03/30/2018   Depression    Diabetes mellitus    Type II   Dyspnea    at times- when air conditioner is runnimg, heat also    Encounter for screening colonoscopy 10/30/2015   GERD (gastroesophageal reflux disease)    Hepatic steatosis    History of  breast cancer 06/01/2012   HTN (hypertension)    Hx of adenomatous polyp of colon 11/07/2019   Hyperlipidemia    Internal carotid artery stenosis 04/03/2018   Personal history of chemotherapy 2009   Left Breast Cancer   Personal history of radiation therapy 2009   Left Breast Cancer   Stroke [facility])    2006, 2015   TIA (transient ischemic attack) 02/20/2014    Past Surgical History:  Procedure Laterality Date   ANKLE ARTHROSCOPY Right 12/14/2016   Procedure: Right Ankle Arthroscopic Debridement;  Surgeon: Newt Minion, MD;  Location: [place];  Service: Orthopedics;  Laterality: Right;   Arm surgery     Left tendon lengthen   BREAST LUMPECTOMY Left 2009   CHOLECYSTECTOMY N/A 12/09/2015   Procedure: LAPAROSCOPIC CHOLECYSTECTOMY WITH INTRAOPERATIVE CHOLANGIOGRAM;  Surgeon: Greer Pickerel, MD;  Location: [place];  Service: General;  Laterality: N/A;   LEFT HEART CATHETERIZATION WITH CORONARY ANGIOGRAM N/A 10/30/2013   Procedure: LEFT HEART CATHETERIZATION WITH CORONARY ANGIOGRAM;  Surgeon: Josue Hector, MD;  Location: [facility] CATH [facility];  Service: Cardiovascular;  Laterality: N/A;   porta cath Right    Removal of Porta cath Right    TUBAL LIGATION      Family History  Problem Relation Age of Onset  Thyroid cancer Sister    Hypertension Mother    Diabetes Mother    Migraines Daughter    Colon cancer Neg Hx    Esophageal cancer Neg Hx    Stomach cancer Neg Hx    Rectal cancer Neg Hx     Social History   Socioeconomic History   Marital status: Single    Spouse name: Not on file   Number of children: 6   Years of education: Not on file   Highest education level: High school graduate  Occupational History   Not on file  Tobacco Use   Smoking status: Never   Smokeless tobacco: Never  Vaping Use   Vaping Use: Never used  Substance and Sexual Activity   Alcohol use: No   Drug use: No   Sexual activity: Not Currently    Birth control/protection: Surgical  Other Topics Concern   Not on  file  Social History Narrative   Single with 6 children    lives with her daughter   Right handed   Never smoker no drug use no alcohol   Social Determinants of Radio broadcast assistant Strain: Not on file  Food Insecurity: Not on file  Transportation Needs: Not on file  Physical Activity: Not on file  Stress: Not on file  Social Connections: Not on file  Intimate Partner Violence: Not on file    Outpatient Medications Prior to Visit  Medication Sig Dispense Refill   aspirin 325 MG EC tablet Take 1 tablet (325 mg total) by mouth daily. 30 tablet 6   Insulin Pen Needle (TRUEPLUS PEN NEEDLES) 32G X 4 MM MISC USE TO INJECT LANTUS DAILY 100 each 2   acetaminophen (TYLENOL) 500 MG tablet Take 1 tablet (500 mg total) by mouth every 6 (six) hours as needed. (Patient not taking: No sig reported) 30 tablet 0   albuterol (PROVENTIL HFA;VENTOLIN HFA) 108 (90 Base) MCG/ACT inhaler Inhale 1-2 puffs into the lungs every 6 (six) hours as needed for wheezing or shortness of breath. (Patient not taking: No sig reported) 54 g 3   atorvastatin (LIPITOR) 40 MG tablet Take 1 tablet (40 mg total) by mouth daily. 30 tablet 6   atorvastatin (LIPITOR) 40 MG tablet TAKE 1 TABLET (40 MG TOTAL) BY MOUTH DAILY. 30 tablet 6   atorvastatin (LIPITOR) 40 MG tablet TAKE 1 TABLET (40 MG TOTAL) BY MOUTH DAILY. 30 tablet 6   carvedilol (COREG) 12.5 MG tablet Take 1 tablet (12.5 mg total) by mouth 2 (two) times daily with a meal. 60 tablet 6   carvedilol (COREG) 12.5 MG tablet TAKE 1 TABLET (12.5 MG TOTAL) BY MOUTH 2 (TWO) TIMES DAILY WITH A MEAL. 60 tablet 6   carvedilol (COREG) 12.5 MG tablet TAKE 1 TABLET (12.5 MG TOTAL) BY MOUTH 2 (TWO) TIMES DAILY WITH A MEAL. 60 tablet 6   diclofenac Sodium (VOLTAREN) 1 % GEL Apply 2g QID x 7 days to affected area in left hip for pain (Patient not taking: No sig reported) 150 g 0   glipiZIDE (GLUCOTROL) 10 MG tablet TAKE 1 TABLET (10 MG TOTAL) BY MOUTH 2 (TWO) TIMES DAILY BEFORE A  MEAL. 60 tablet 1   glipiZIDE (GLUCOTROL) 10 MG tablet TAKE 1 TABLET (10 MG TOTAL) BY MOUTH 2 (TWO) TIMES DAILY BEFORE A MEAL. 60 tablet 0   glipiZIDE (GLUCOTROL) 10 MG tablet TAKE 1 TABLET (10 MG TOTAL) BY MOUTH 2 (TWO) TIMES DAILY BEFORE A MEAL. 60 tablet 6  ibuprofen (ADVIL) 600 MG tablet Take 1 tablet (600 mg total) by mouth every 6 (six) hours as needed. (Patient not taking: No sig reported) 30 tablet 0   insulin glargine (LANTUS SOLOSTAR) 100 UNIT/ML Solostar Pen Inject 32 Units into the skin 2 (two) times daily. 30 mL 6   lisinopril (ZESTRIL) 10 MG tablet Take 1 tablet (10 mg total) by mouth daily. 30 tablet 6   lisinopril (ZESTRIL) 10 MG tablet TAKE 1 TABLET (10 MG TOTAL) BY MOUTH DAILY. 30 tablet 6   lisinopril (ZESTRIL) 10 MG tablet TAKE 1 TABLET (10 MG TOTAL) BY MOUTH DAILY. 30 tablet 6   metroNIDAZOLE (METROGEL VAGINAL) 0.75 % vaginal gel Place 1 Applicatorful vaginally at bedtime. 70 g 0   metroNIDAZOLE (METROGEL) 0.75 % vaginal gel PLACE 1 APPLICATORFUL VAGINALLY AT BEDTIME. 70 g 0   olopatadine (PATANOL) 0.1 % ophthalmic solution Place 1 drop into both eyes 2 (two) times daily. 5 mL 2   ondansetron (ZOFRAN) 4 MG tablet Take 1 tablet (4 mg total) by mouth every 8 (eight) hours as needed for nausea or vomiting. 20 tablet 0   ondansetron (ZOFRAN) 4 MG tablet TAKE 1 TABLET (4 MG TOTAL) BY MOUTH EVERY 8 (EIGHT) HOURS AS NEEDED FOR NAUSEA OR VOMITING. 20 tablet 0   pantoprazole (PROTONIX) 40 MG tablet Take 1 tablet (40 mg total) by mouth 2 (two) times daily. To reduce stomach acid 60 tablet 4   pantoprazole (PROTONIX) 40 MG tablet TAKE 1 TABLET (40 MG TOTAL) BY MOUTH 2 (TWO) TIMES DAILY. TO REDUCE STOMACH ACID 60 tablet 4   pantoprazole (PROTONIX) 40 MG tablet TAKE 1 TABLET (40 MG TOTAL) BY MOUTH 2 (TWO) TIMES DAILY. TO REDUCE STOMACH ACID 60 tablet 4   rizatriptan (MAXALT) 5 MG tablet Take 1 tablet (5 mg total) by mouth as needed for migraine. May repeat in 2 hours if needed 10 tablet 3    rizatriptan (MAXALT) 5 MG tablet TAKE 1 TABLET (5 MG TOTAL) BY MOUTH AS NEEDED FOR MIGRAINE. MAY REPEAT IN 2 HOURS IF NEEDED 10 tablet 3   rizatriptan (MAXALT) 5 MG tablet TAKE 1 TABLET (5 MG TOTAL) BY MOUTH AS NEEDED FOR MIGRAINE. MAY REPEAT IN 2 HOURS IF NEEDED 10 tablet 3   topiramate (TOPAMAX) 50 MG tablet Take 1 tablet (50 mg total) by mouth 2 (two) times daily. 60 tablet 3   topiramate (TOPAMAX) 50 MG tablet TAKE 1 TABLET (50 MG TOTAL) BY MOUTH 2 (TWO) TIMES DAILY. 60 tablet 3   traZODone (DESYREL) 100 MG tablet Take 1 tablet (100 mg total) by mouth at bedtime as needed for sleep. 30 tablet 3   traZODone (DESYREL) 100 MG tablet TAKE 1 TABLET (100 MG TOTAL) BY MOUTH AT BEDTIME AS NEEDED FOR SLEEP. 30 tablet 3   Facility-Administered Medications Prior to Visit  Medication Dose Route Frequency Provider Last Rate Last Admin   0.9 %  sodium chloride infusion  500 mL Intravenous Continuous Gatha Mayer, MD        Allergies  Allergen Reactions   Gadolinium Derivatives Nausea And Vomiting    Code: VOM, Desc: Pt began vomiting 45 sec after MRI contrast injection of Multihance, Onset Date: [id number]     Iodinated Diagnostic Agents Nausea And Vomiting    ROS Review of Systems  Constitutional: Negative.   HENT: Negative.    Eyes: Negative.   Respiratory:  Negative for shortness of breath.   Cardiovascular:  Negative for chest pain.  Gastrointestinal: Negative.  Endocrine: Negative.   Genitourinary: Negative.   Musculoskeletal: Negative.   Skin: Negative.   Allergic/Immunologic: Negative.   Neurological:  Positive for headaches. Negative for dizziness and weakness.  Hematological: Negative.   Psychiatric/Behavioral:  Positive for sleep disturbance. Negative for self-injury and suicidal ideas.      Objective:    Physical Exam Vitals and nursing note reviewed.  Constitutional:      Appearance: Normal appearance.  HENT:     Head: Normocephalic and atraumatic.     Right Ear:  External ear normal.     Left Ear: External ear normal.     Nose: Nose normal.     Mouth/Throat:     Mouth: Mucous membranes are moist.     Pharynx: Oropharynx is clear.  Eyes:     Extraocular Movements: Extraocular movements intact.     Conjunctiva/sclera: Conjunctivae normal.     Pupils: Pupils are equal, round, and reactive to light.  Cardiovascular:     Rate and Rhythm: Normal rate and regular rhythm.     Pulses: Normal pulses.     Heart sounds: Normal heart sounds.  Pulmonary:     Effort: Pulmonary effort is normal.     Breath sounds: Normal breath sounds.  Musculoskeletal:        General: Normal range of motion.     Cervical back: Normal range of motion and neck supple.  Skin:    General: Skin is warm and dry.  Neurological:     General: No focal deficit present.     Mental Status: She is alert and oriented to person, place, and time.  Psychiatric:        Mood and Affect: Mood normal.        Behavior: Behavior normal.        Thought Content: Thought content normal.        Judgment: Judgment normal.    BP (!) 155/73 (BP Location: Left Arm, Patient Position: Sitting, Cuff Size: Normal)   Pulse 60   Temp 98.2 F (36.8 C) (Oral)   Resp 18   Ht 4\' 8"  (1.422 m)   Wt 151 lb (68.5 kg)   SpO2 100%   BMI 33.85 kg/m  Wt Readings from Last 3 Encounters:  05/13/21 151 lb (68.5 kg)  09/16/20 157 lb 3.2 oz (71.3 kg)  09/14/20 165 lb 5.5 oz (75 kg)     Health Maintenance Due  Topic Date Due   COVID-19 Vaccine (1) Never done   OPHTHALMOLOGY EXAM  Never done   Zoster Vaccines- Shingrix (1 of 2) Never done   FOOT EXAM  08/16/2019   MAMMOGRAM  01/26/2020    There are no preventive care reminders to display for this patient.  Lab Results  Component Value Date   TSH 1.500 02/11/2020   Lab Results  Component Value Date   WBC 5.8 09/14/2020   HGB 14.5 09/14/2020   HCT 43.2 09/14/2020   MCV 88.9 09/14/2020   PLT 138 (L) 09/14/2020   Lab Results  Component Value  Date   NA 145 (H) 12/02/2020   K 4.3 12/02/2020   CHLORIDE 104 09/30/2013   CO2 22 12/02/2020   GLUCOSE 116 (H) 12/02/2020   BUN 16 12/02/2020   CREATININE 0.78 12/02/2020   BILITOT 0.8 12/02/2020   ALKPHOS 96 12/02/2020   AST 40 12/02/2020   ALT 33 (H) 12/02/2020   PROT 6.9 12/02/2020   ALBUMIN 3.7 (L) 12/02/2020   CALCIUM 9.1 12/02/2020   ANIONGAP 10  09/14/2020   GFR 84.64 10/29/2019   Lab Results  Component Value Date   CHOL 113 12/02/2020   Lab Results  Component Value Date   HDL 41 12/02/2020   Lab Results  Component Value Date   LDLCALC 52 12/02/2020   Lab Results  Component Value Date   TRIG 107 12/02/2020   Lab Results  Component Value Date   CHOLHDL 2.8 12/02/2020   Lab Results  Component Value Date   HGBA1C 7.5 (H) 12/02/2020      Assessment & Plan:   Problem List Items Addressed This Visit       Cardiovascular and Mediastinum   HTN (hypertension)   Relevant Medications   atorvastatin (LIPITOR) 40 MG tablet   carvedilol (COREG) 12.5 MG tablet   lisinopril (ZESTRIL) 10 MG tablet   CAD (coronary artery disease), native coronary artery   Relevant Medications   atorvastatin (LIPITOR) 40 MG tablet   carvedilol (COREG) 12.5 MG tablet   lisinopril (ZESTRIL) 10 MG tablet     Digestive   GERD (gastroesophageal reflux disease)   Relevant Medications   pantoprazole (PROTONIX) 40 MG tablet     Endocrine   Diabetes mellitus (HCC) - Primary   Relevant Medications   atorvastatin (LIPITOR) 40 MG tablet   lisinopril (ZESTRIL) 10 MG tablet   glipiZIDE (GLUCOTROL) 10 MG tablet   insulin glargine (LANTUS SOLOSTAR) 100 UNIT/ML Solostar Pen   Other Relevant Orders   HgB A1c   Glucose (CBG)     Other   Hyperlipidemia   Relevant Medications   atorvastatin (LIPITOR) 40 MG tablet   carvedilol (COREG) 12.5 MG tablet   lisinopril (ZESTRIL) 10 MG tablet   Other Visit Diagnoses     Vitamin D deficiency       Relevant Orders   Vitamin D, 25-hydroxy    Daily headache       Relevant Medications   carvedilol (COREG) 12.5 MG tablet   rizatriptan (MAXALT) 5 MG tablet   topiramate (TOPAMAX) 50 MG tablet   traZODone (DESYREL) 100 MG tablet   Insomnia, unspecified type       Relevant Medications   traZODone (DESYREL) 100 MG tablet       Meds ordered this encounter  Medications   atorvastatin (LIPITOR) 40 MG tablet    Sig: TAKE 1 TABLET (40 MG TOTAL) BY MOUTH DAILY.    Dispense:  30 tablet    Refill:  6    Order Specific Question:   Supervising Provider    Answer:   Joya Gaskins, PATRICK E [[id number]]   carvedilol (COREG) 12.5 MG tablet    Sig: TAKE 1 TABLET (12.5 MG TOTAL) BY MOUTH 2 (TWO) TIMES DAILY WITH A MEAL.    Dispense:  60 tablet    Refill:  6    Order Specific Question:   Supervising Provider    Answer:   Asencion Noble E [[id number]]   rizatriptan (MAXALT) 5 MG tablet    Sig: TAKE 1 TABLET (5 MG TOTAL) BY MOUTH AS NEEDED FOR MIGRAINE. MAY REPEAT IN 2 HOURS IF NEEDED    Dispense:  10 tablet    Refill:  3    Order Specific Question:   Supervising Provider    Answer:   Asencion Noble E [[id number]]   pantoprazole (PROTONIX) 40 MG tablet    Sig: TAKE 1 TABLET (40 MG TOTAL) BY MOUTH 2 (TWO) TIMES DAILY. TO REDUCE STOMACH ACID    Dispense:  60 tablet    Refill:  4    Order Specific Question:   Supervising Provider    Answer:   Elsie Stain [[id number]]   topiramate (TOPAMAX) 50 MG tablet    Sig: TAKE 1 TABLET (50 MG TOTAL) BY MOUTH 2 (TWO) TIMES DAILY.    Dispense:  60 tablet    Refill:  3    Order Specific Question:   Supervising Provider    Answer:   Joya Gaskins, PATRICK E [[id number]]   lisinopril (ZESTRIL) 10 MG tablet    Sig: TAKE 1 TABLET (10 MG TOTAL) BY MOUTH DAILY.    Dispense:  30 tablet    Refill:  6    Order Specific Question:   Supervising Provider    Answer:   Joya Gaskins, PATRICK E [[id number]]   glipiZIDE (GLUCOTROL) 10 MG tablet    Sig: TAKE 1 TABLET (10 MG TOTAL) BY MOUTH 2 (TWO) TIMES DAILY BEFORE A MEAL.    Dispense:  60 tablet     Refill:  1    Order Specific Question:   Supervising Provider    Answer:   Asencion Noble E [[id number]]   insulin glargine (LANTUS SOLOSTAR) 100 UNIT/ML Solostar Pen    Sig: Inject 32 Units into the skin 2 (two) times daily.    Dispense:  30 mL    Refill:  6    Dose change    Order Specific Question:   Supervising Provider    Answer:   Asencion Noble E [[id number]]   traZODone (DESYREL) 100 MG tablet    Sig: TAKE 1 TABLET (100 MG TOTAL) BY MOUTH AT BEDTIME AS NEEDED FOR SLEEP.    Dispense:  30 tablet    Refill:  3    Order Specific Question:   Supervising Provider    Answer:   Joya Gaskins, PATRICK E [[id number]]   1. Type 2 diabetes mellitus with diabetic neuropathy, with long-term current use of insulin (HCC) A1c improved to 6.7.  Continue current regimen - HgB A1c - Glucose (CBG) - glipiZIDE (GLUCOTROL) 10 MG tablet; TAKE 1 TABLET (10 MG TOTAL) BY MOUTH 2 (TWO) TIMES DAILY BEFORE A MEAL.  Dispense: 60 tablet; Refill: 1 - insulin glargine (LANTUS SOLOSTAR) 100 UNIT/ML Solostar Pen; Inject 32 Units into the skin 2 (two) times daily.  Dispense: 30 mL; Refill: 6  2. Daily headache Continue current regimen.  Patient encouraged to improve sleep hygiene, continue hydration.  Red flags given for prompt reevaluation - rizatriptan (MAXALT) 5 MG tablet; TAKE 1 TABLET (5 MG TOTAL) BY MOUTH AS NEEDED FOR MIGRAINE. MAY REPEAT IN 2 HOURS IF NEEDED  Dispense: 10 tablet; Refill: 3 - topiramate (TOPAMAX) 50 MG tablet; TAKE 1 TABLET (50 MG TOTAL) BY MOUTH 2 (TWO) TIMES DAILY.  Dispense: 60 tablet; Refill: 3  3. Vitamin D deficiency   - Vitamin D, 25-hydroxy  4. Insomnia, unspecified type Patient encouraged to do trial of taking trazodone on a daily basis to see if this begins to offer relief from daily headaches.  Patient understands and agrees - traZODone (DESYREL) 100 MG tablet; TAKE 1 TABLET (100 MG TOTAL) BY MOUTH AT BEDTIME AS NEEDED FOR SLEEP.  Dispense: 30 tablet; Refill: 3  5. Pure  hypercholesterolemia   - atorvastatin (LIPITOR) 40 MG tablet; TAKE 1 TABLET (40 MG TOTAL) BY MOUTH DAILY.  Dispense: 30 tablet; Refill: 6  6. Primary hypertension  - carvedilol (COREG) 12.5 MG tablet; TAKE 1 TABLET (12.5 MG TOTAL) BY MOUTH 2 (TWO) TIMES DAILY WITH A MEAL.  Dispense: 60 tablet; Refill:  6 - lisinopril (ZESTRIL) 10 MG tablet; TAKE 1 TABLET (10 MG TOTAL) BY MOUTH DAILY.  Dispense: 30 tablet; Refill: 6  7. Coronary artery disease involving native coronary artery of native heart with other form of angina pectoris ([place])   8. Gastroesophageal reflux disease without esophagitis  - pantoprazole (PROTONIX) 40 MG tablet; TAKE 1 TABLET (40 MG TOTAL) BY MOUTH 2 (TWO) TIMES DAILY. TO REDUCE STOMACH ACID  Dispense: 60 tablet; Refill: 4   I have reviewed the patient's medical history (PMH, PSH, Social History, Family History, Medications, and allergies) , and have been updated if relevant. I spent 32 minutes reviewing chart and  face to face time with patient.   Follow-up: Return in about 7 weeks (around 07/02/2021) for [facility].    Loraine Grip Mayers, PA-C

## 2021-05-13 NOTE — Progress Notes (Signed)
Patient presents for DM refills. Patient denies pain at this time.

## 2021-05-13 NOTE — Patient Instructions (Signed)
Insomnio Insomnia El insomnio es un trastorno del sueo que causa dificultades para conciliar el sueo o para [place]. Puede producir fatiga, falta de energa, dificultad para concentrarse, cambios en el estado de nimo y mal rendimiento escolar olaboral. Hay tres formas diferentes de clasificar el insomnio: Dificultad para conciliar el sueo. Dificultad para mantener el sueo. Despertar muy precoz por la maana. Cualquier tipo de insomnio puede ser a Barrister's clerk (crnico) o a Control and instrumentation engineer (agudo). Ambos son frecuentes. Generalmente, el insomnio a corto plazo dura tres meses o menos tiempo. El crnico ocurre al menos tres veces por semana durantems de tres meses. Cules son las causas? El insomnio puede deberse a otra afeccin, situacin o sustancia, por ejemplo: Ansiedad. Ciertos medicamentos. Enfermedad de reflujo gastroesofgico (ERGE) u otras enfermedades gastrointestinales. Asma y otras enfermedades respiratorias. Sndrome de las piernas inquietas, apnea del sueo u otros trastornos del sueo. Dolor crnico. Menopausia. Accidente cerebrovascular. Consumo excesivo de alcohol, tabaco u drogas ilegales. Afecciones de salud mental, como depresin. Cafena. Trastornos neurolgicos, como enfermedad de Alzheimer. Hiperactividad de la glndula tiroidea (hipertiroidismo). En ocasiones, la causa del insomnio puede ser desconocida. Qu incrementa el riesgo? Los factores de riesgo de tener insomnio incluyen lo siguiente: Sexo. La enfermedad afecta ms a menudo a las mujeres que a los hombres. La edad. El insomnio es ms frecuente a medida que una persona envejece. Estrs. Falta de actividad fsica. Los horarios de trabajo irregulares o los turnos nocturnos. Los viajes a lugares de diferentes zonas horarias. Ciertas afecciones mdicas y de salud mental. Cules son los signos o sntomas? Si tiene insomnio, el sntoma principal es la dificultad para conciliar el sueo o mantenerlo. Esto  puede derivar en otros sntomas, por ejemplo: Sentirse fatigado o tener poca energa. Ponerse nervioso por [employer] irse a dormir. No sentirse descansado por la maana. Tener dificultad para concentrarse. Sentirse irritable, ansioso o deprimido. Cmo se diagnostica? Esta afeccin se puede diagnosticar en funcin de lo siguiente: Los sntomas y los antecedentes mdicos. El mdico puede hacerle preguntas sobre: Hbitos de sueo. Cualquier afeccin mdica que tenga. La salud mental. Un examen fsico. Cmo se trata? El tratamiento para el insomnio depende de la causa. El tratamiento puede centrarse en tratar Ardelia Mems afeccin preexistente que causa el insomnio. El tratamiento tambin puede incluir lo siguiente: Medicamentos que lo ayuden a dormir. Asesoramiento psicolgico o terapia. Ajustes en el estilo de vida para ayudarlo a dormir mejor. Siga estas instrucciones en su casa: Comida y bebida  Limite o evite el consumo de alcohol, bebidas con cafena y cigarrillos, especialmente cerca de la hora de [place], ya que pueden perturbarle el sueo. No consuma una comida suculenta ni coma alimentos condimentados justo antes de la hora de [place]. Esto puede causarle molestias digestivas y dificultades para dormir.  Hbitos de sueo  Lleve un registro del sueo ya que podra ser de utilidad para que usted y a su mdico puedan determinar qu podra estar causndole insomnio. Escriba los siguientes datos: Cundo duerme. Cundo se despierta durante la noche. Qu tan bien duerme. Qu tan relajado se siente al da siguiente. Cualquier efecto secundario de los medicamentos que toma. Lo que usted come y bebe. Convierta su habitacin en un lugar oscuro, cmodo donde sea fcil conciliar el sueo. Coloque persianas o cortinas oscuras que impidan la entrada de la luz del exterior. Para bloquear los ruidos, use un aparato que reproduzca sonidos ambientales o relajantes de fondo. Mantenga baja la  temperatura. Limite el uso de pantallas antes de la hora de  acostarse. Esto incluye: Watching TV. Usar el telfono inteligente, la tableta o la computadora. Siga una rutina que incluya ir a dormir y Clinical cytogeneticist a la misma [place] y noche. Esto puede ayudarlo a conciliar el sueo ms rpidamente. Considere realizar [employer] tranquila, como leer, e incorporarla como parte de la rutina a la hora de irse a dormir. Trate de evitar tomar siestas durante el da para que pueda dormir mejor por la noche. Levntese de la cama si sigue despierto despus de 15 minutos de haber intentado dormirse. [place] luces, pero intente leer o hacer una actividad tranquila. Cuando tenga sueo, regrese a Futures trader.  Instrucciones generales Use los medicamentos de venta libre y los recetados solamente como se lo haya indicado el mdico. Realice ejercicio con regularidad como se lo haya indicado el mdico. Evite la actividad fsica desde varias horas antes de irse a dormir. Utilice tcnicas de relajacin para controlar el estrs. Pdale al mdico que le sugiera algunas tcnicas que sean adecuadas para usted. Pueden incluir: Ejercicios de respiracin. Rutinas para aliviar la tensin muscular. Visualizacin de escenas apacibles. Conduzca con cuidado. No conduzca si est muy somnoliento. Concurra a todas las visitas de seguimiento como se lo haya indicado el mdico. Esto es importante. Comunquese con un mdico si: Est cansado durante todo Games developer. Tiene dificultad en su rutina diaria debido a la somnolencia. Sigue teniendo problemas para dormir o Press photographer. Solicite ayuda de inmediato si: Piensa seriamente en lastimarse a usted mismo o a Nurse, children's. Si alguna vez siente que puede lastimarse o Market researcher a Producer, television/film/video, o tiene pensamientos de poner fin a su vida, busque ayuda de inmediato. Puede dirigirse al servicio de emergencias ms cercano o comunicarse con: Servicio de emergencias de su  localidad (911 en [place] Una lnea de asistencia al suicida y atencin en crisis como National Suicide Prevention Lifeline ([place]), al [phone]. Est disponible las 24 horas del da. Resumen El insomnio es un trastorno del sueo que causa dificultades para conciliar el sueo o para [place]. El insomnio puede ser a Barrister's clerk (crnico) o a Control and instrumentation engineer (agudo). El tratamiento para el insomnio depende de la causa. El tratamiento puede centrarse en tratar Ardelia Mems afeccin preexistente que causa el insomnio. Lleve un registro del sueo ya que podra ser de utilidad para que usted y a su mdico puedan determinar qu podra estar causndole insomnio. Esta informacin no tiene Marine scientist el consejo del mdico. Asegresede hacerle al mdico cualquier pregunta que tenga. Document Revised: 09/21/2020 Document Reviewed: 09/21/2020 Elsevier Patient Education  2022 [employer].

## 2021-05-14 ENCOUNTER — Other Ambulatory Visit: Payer: Self-pay

## 2021-05-14 LAB — VITAMIN D 25 HYDROXY (VIT D DEFICIENCY, FRACTURES): Vit D, 25-Hydroxy: 38.1 ng/mL (ref 30.0–100.0)

## 2021-05-17 LAB — POCT GLYCOSYLATED HEMOGLOBIN (HGB A1C): Hemoglobin A1C: 6.7 % — AB (ref 4.0–5.6)

## 2021-05-17 LAB — GLUCOSE, POCT (MANUAL RESULT ENTRY): POC Glucose: 176 mg/dl — AB (ref 70–99)

## 2021-05-19 ENCOUNTER — Other Ambulatory Visit: Payer: Self-pay

## 2021-05-25 ENCOUNTER — Telehealth: Payer: Self-pay | Admitting: *Deleted

## 2021-05-25 NOTE — Telephone Encounter (Signed)
-----   Message from Kennieth Rad, [place] sent at 05/17/2021  9:14 AM EDT ----- Please call patient and let her know that vit D levels are within normal limits, but because she has had deficiency in the past, she should eat a diet high in Vit d or take a daily 1000 IU supplement that she would purchase OTC

## 2021-05-25 NOTE — Telephone Encounter (Signed)
Medical Assistant used [place] [employer] to contact patient.  Interpreter Name: Lorane Gell Interpreter #: [phone] UTR patient on either contacts with no VM option.

## 2021-05-30 ENCOUNTER — Emergency Department (HOSPITAL_COMMUNITY): Payer: Self-pay

## 2021-05-30 ENCOUNTER — Emergency Department (HOSPITAL_COMMUNITY)
Admission: EM | Admit: 2021-05-30 | Discharge: 2021-05-30 | Disposition: A | Discharge status: Home / Self Care | Payer: Self-pay | Attending: Emergency Medicine | Admitting: Emergency Medicine

## 2021-05-30 ENCOUNTER — Encounter (HOSPITAL_COMMUNITY): Payer: Self-pay | Admitting: Emergency Medicine

## 2021-05-30 DIAGNOSIS — W01198A Fall on same level from slipping, tripping and stumbling with subsequent striking against other object, initial encounter: Secondary | ICD-10-CM | POA: Insufficient documentation

## 2021-05-30 DIAGNOSIS — I2510 Atherosclerotic heart disease of native coronary artery without angina pectoris: Secondary | ICD-10-CM | POA: Insufficient documentation

## 2021-05-30 DIAGNOSIS — I10 Essential (primary) hypertension: Secondary | ICD-10-CM | POA: Insufficient documentation

## 2021-05-30 DIAGNOSIS — Z794 Long term (current) use of insulin: Secondary | ICD-10-CM | POA: Insufficient documentation

## 2021-05-30 DIAGNOSIS — E1142 Type 2 diabetes mellitus with diabetic polyneuropathy: Secondary | ICD-10-CM | POA: Insufficient documentation

## 2021-05-30 DIAGNOSIS — Z853 Personal history of malignant neoplasm of breast: Secondary | ICD-10-CM | POA: Insufficient documentation

## 2021-05-30 DIAGNOSIS — R0789 Other chest pain: Secondary | ICD-10-CM | POA: Insufficient documentation

## 2021-05-30 DIAGNOSIS — Y93E5 Activity, floor mopping and cleaning: Secondary | ICD-10-CM | POA: Insufficient documentation

## 2021-05-30 DIAGNOSIS — R0602 Shortness of breath: Secondary | ICD-10-CM | POA: Insufficient documentation

## 2021-05-30 DIAGNOSIS — W19XXXA Unspecified fall, initial encounter: Secondary | ICD-10-CM

## 2021-05-30 DIAGNOSIS — Z7982 Long term (current) use of aspirin: Secondary | ICD-10-CM | POA: Insufficient documentation

## 2021-05-30 DIAGNOSIS — Y92002 Bathroom of unspecified non-institutional (private) residence single-family (private) house as the place of occurrence of the external cause: Secondary | ICD-10-CM | POA: Insufficient documentation

## 2021-05-30 DIAGNOSIS — Z7984 Long term (current) use of oral hypoglycemic drugs: Secondary | ICD-10-CM | POA: Insufficient documentation

## 2021-05-30 DIAGNOSIS — Z79899 Other long term (current) drug therapy: Secondary | ICD-10-CM | POA: Insufficient documentation

## 2021-05-30 LAB — CBC
HCT: 41 % (ref 36.0–46.0)
Hemoglobin: 14 g/dL (ref 12.0–15.0)
MCH: 30.7 pg (ref 26.0–34.0)
MCHC: 34.1 g/dL (ref 30.0–36.0)
MCV: 89.9 fL (ref 80.0–100.0)
Platelets: 138 10*3/uL — ABNORMAL LOW (ref 150–400)
RBC: 4.56 MIL/uL (ref 3.87–5.11)
RDW: 12.4 % (ref 11.5–15.5)
WBC: 6 10*3/uL (ref 4.0–10.5)
nRBC: 0 % (ref 0.0–0.2)

## 2021-05-30 LAB — COMPREHENSIVE METABOLIC PANEL
ALT: 22 U/L (ref 0–44)
AST: 34 U/L (ref 15–41)
Albumin: 3.4 g/dL — ABNORMAL LOW (ref 3.5–5.0)
Alkaline Phosphatase: 83 U/L (ref 38–126)
Anion gap: 8 (ref 5–15)
BUN: 15 mg/dL (ref 8–23)
CO2: 26 mmol/L (ref 22–32)
Calcium: 9.4 mg/dL (ref 8.9–10.3)
Chloride: 105 mmol/L (ref 98–111)
Creatinine, Ser: 0.73 mg/dL (ref 0.44–1.00)
GFR, Estimated: >60 mL/min (ref >60)
Glucose, Bld: 168 mg/dL — ABNORMAL HIGH (ref 70–99)
Potassium: 4.2 mmol/L (ref 3.5–5.1)
Sodium: 139 mmol/L (ref 135–145)
Total Bilirubin: 1.2 mg/dL (ref 0.3–1.2)
Total Protein: 6.8 g/dL (ref 6.5–8.1)

## 2021-05-30 MED ORDER — MORPHINE SULFATE (PF) 2 MG/ML IV SOLN
2.0000 mg | Freq: Once | INTRAVENOUS | Status: AC
Start: 2021-05-30 — End: 2021-05-30
  Administered 2021-05-30: 2 mg via INTRAVENOUS
  Filled 2021-05-30: qty 1

## 2021-05-30 MED ORDER — HYDROMORPHONE HCL 1 MG/ML IJ SOLN
1.0000 mg | Freq: Once | INTRAMUSCULAR | Status: AC
  Administered 2021-05-30: 1 mg via INTRAVENOUS
  Filled 2021-05-30: qty 1

## 2021-05-30 MED ORDER — ONDANSETRON HCL 4 MG/2ML IJ SOLN
4.0000 mg | Freq: Once | INTRAMUSCULAR | Status: AC
  Administered 2021-05-30: 4 mg via INTRAVENOUS
  Filled 2021-05-30: qty 2

## 2021-05-30 MED ORDER — HYDROCODONE-ACETAMINOPHEN 5-325 MG PO TABS: 1.0000 | ORAL_TABLET | ORAL | 0 refills | Status: AC | PRN

## 2021-05-30 NOTE — ED Provider Notes (Signed)
[facility] EMERGENCY DEPARTMENT Provider Note   CSN: [id number] Arrival date & time: 05/30/21  1507     History No chief complaint on file.   Brandi Bryant is a 65 y.o. female.  HPI 65 year old female with a history of Bell's palsy, breast cancer, DM type II, depression, hypertension, hyperlipidemia presents to the ER with complaints of right-sided chest wall pain after a fall.  Patient states that she was cleaning a house yesterday, was in the middle of cleaning a mirror standing on a stool when the stool slipped out from under her and she fell onto her right chest.  She denies any head injury or LOC.  Denies any chest pain pain or dizziness surrounding the fall.  She complains of 10 out of 10 right-sided chest wall pain, and shortness of breath when she lays down at night.  She has been taking ibuprofen for pain.    Past Medical History:  Diagnosis Date   Abdominal pain    Allergy    Arthralgia 08/13/2013   Bell palsy 2006   Bell's palsy    Breast cancer ([place]) 2009   Left Breast Cancer   Cataract    Chest pain 09/30/2013   Cholelithiasis    Chronic sinusitis 05/16/2017   Colon cancer screening 18/03/6313   Complication of anesthesia    " tubal ligation, in [place]"  had weakness, couldnt stand up the next day, was given pills for oxygen for Brain."  1 month after I was having loss of attentiviness, still have to focus  carefully.    Coronary artery disease    CVA (cerebral vascular accident) ([place]) 03/30/2018   Depression    Diabetes mellitus    Type II   Dyspnea    at times- when air conditioner is runnimg, heat also    Encounter for screening colonoscopy 10/30/2015   GERD (gastroesophageal reflux disease)    Hepatic steatosis    History of breast cancer 06/01/2012   HTN (hypertension)    Hx of adenomatous polyp of colon 11/07/2019   Hyperlipidemia    Internal carotid artery stenosis 04/03/2018   Personal history of chemotherapy 2009   Left  Breast Cancer   Personal history of radiation therapy 2009   Left Breast Cancer   Stroke ([place])    2006, 2015   TIA (transient ischemic attack) 02/20/2014    Patient Active Problem List   Diagnosis Date Noted   Vitamin D deficiency 05/13/2021   Insomnia 05/13/2021   Cirrhosis of liver without ascites ([place]) 11/14/2019   Kidney lesion, native, left 11/14/2019   Hx of adenomatous polyp of colon 11/07/2019   GERD (gastroesophageal reflux disease) 11/27/2017   Diabetic polyneuropathy associated with type 2 diabetes mellitus ([place]) 10/31/2016   Impingement syndrome of right ankle 10/31/2016   Symptomatic cholelithiasis 12/09/2015   Daily headache 02/21/2014   Trigeminal neuralgia 01/22/2014   Weakness 01/22/2014   Bell's palsy 01/21/2014   UTI (urinary tract infection) 01/21/2014   NAFLD (nonalcoholic fatty liver disease) 01/21/2014   Facial droop 01/15/2014   Stroke ([facility]) 01/15/2014   Type 2 diabetes mellitus with diabetic neuropathy ([place]) 12/06/2013   Hyperlipidemia 11/07/2013   CAD (coronary artery disease), native coronary artery 11/07/2013   Chest pain 09/30/2013   History of breast cancer 06/01/2012   HTN (hypertension)    Diabetes mellitus ([place])     Past Surgical History:  Procedure Laterality Date   ANKLE ARTHROSCOPY Right 12/14/2016   Procedure: Right Ankle Arthroscopic Debridement;  Surgeon: Newt Minion, MD;  Location: [facility];  Service: Orthopedics;  Laterality: Right;   Arm surgery     Left tendon lengthen   BREAST LUMPECTOMY Left 2009   CHOLECYSTECTOMY N/A 12/09/2015   Procedure: LAPAROSCOPIC CHOLECYSTECTOMY WITH INTRAOPERATIVE CHOLANGIOGRAM;  Surgeon: Greer Pickerel, MD;  Location: [facility];  Service: General;  Laterality: N/A;   LEFT HEART CATHETERIZATION WITH CORONARY ANGIOGRAM N/A 10/30/2013   Procedure: LEFT HEART CATHETERIZATION WITH CORONARY ANGIOGRAM;  Surgeon: Josue Hector, MD;  Location: [facility] CATH LAB;  Service: Cardiovascular;  Laterality: N/A;   porta cath Right     Removal of Porta cath Right    TUBAL LIGATION       OB History     Gravida  6   Para  6   Term  6   Preterm      AB      Living  6      SAB      IAB      Ectopic      Multiple      Live Births              Family History  Problem Relation Age of Onset   Thyroid cancer Sister    Hypertension Mother    Diabetes Mother    Migraines Daughter    Colon cancer Neg Hx    Esophageal cancer Neg Hx    Stomach cancer Neg Hx    Rectal cancer Neg Hx     Social History   Tobacco Use   Smoking status: Never   Smokeless tobacco: Never  Vaping Use   Vaping Use: Never used  Substance Use Topics   Alcohol use: No   Drug use: No    Home Medications Prior to Admission medications   Medication Sig Start Date End Date Taking? Authorizing Provider  HYDROcodone-acetaminophen (NORCO/VICODIN) 5-325 MG tablet Take 1 tablet by mouth every 4 (four) hours as needed for up to 5 days. 05/30/21 06/04/21 Yes Garald Balding, PA-C  aspirin 325 MG EC tablet Take 1 tablet (325 mg total) by mouth daily. 06/14/18   Argentina Donovan, PA-C  atorvastatin (LIPITOR) 40 MG tablet TAKE 1 TABLET (40 MG TOTAL) BY MOUTH DAILY. 05/13/21 05/13/22  Mayers, Cari S, PA-C  carvedilol (COREG) 12.5 MG tablet TAKE 1 TABLET (12.5 MG TOTAL) BY MOUTH 2 (TWO) TIMES DAILY WITH A MEAL. 05/13/21 05/13/22  Mayers, Cari S, PA-C  glipiZIDE (GLUCOTROL) 10 MG tablet TAKE 1 TABLET (10 MG TOTAL) BY MOUTH 2 (TWO) TIMES DAILY BEFORE A MEAL. 05/13/21 05/13/22  Mayers, Cari S, PA-C  insulin glargine (LANTUS SOLOSTAR) 100 UNIT/ML Solostar Pen Inject 32 Units into the skin 2 (two) times daily. 05/13/21   Mayers, Cari S, PA-C  Insulin Pen Needle (TRUEPLUS PEN NEEDLES) 32G X 4 MM MISC USE TO INJECT LANTUS DAILY 03/08/21   Charlott Rakes, MD  lisinopril (ZESTRIL) 10 MG tablet TAKE 1 TABLET (10 MG TOTAL) BY MOUTH DAILY. 05/13/21 05/13/22  Mayers, Cari S, PA-C  pantoprazole (PROTONIX) 40 MG tablet TAKE 1 TABLET (40 MG TOTAL) BY MOUTH 2  (TWO) TIMES DAILY. TO REDUCE STOMACH ACID 05/13/21 05/13/22  Mayers, Cari S, PA-C  rizatriptan (MAXALT) 5 MG tablet TAKE 1 TABLET (5 MG TOTAL) BY MOUTH AS NEEDED FOR MIGRAINE. MAY REPEAT IN 2 HOURS IF NEEDED 05/13/21 05/13/22  Mayers, Cari S, PA-C  topiramate (TOPAMAX) 50 MG tablet TAKE 1 TABLET (50 MG TOTAL) BY MOUTH 2 (TWO) TIMES DAILY.  05/13/21 05/13/22  Mayers, Cari S, PA-C  traZODone (DESYREL) 100 MG tablet TAKE 1 TABLET (100 MG TOTAL) BY MOUTH AT BEDTIME AS NEEDED FOR SLEEP. 05/13/21 05/13/22  Mayers, Cari S, PA-C  escitalopram (LEXAPRO) 10 MG tablet Take 10 mg by mouth daily.  01/31/12  [provider]    Allergies    Gadolinium derivatives and Iodinated diagnostic agents  Review of Systems   Review of Systems  Constitutional:  Negative for chills and fever.  HENT:  Negative for ear pain and sore throat.   Eyes:  Negative for pain and visual disturbance.  Respiratory:  Negative for cough and shortness of breath.   Cardiovascular:  Positive for chest pain (Chest wall pain). Negative for palpitations.  Gastrointestinal:  Negative for abdominal pain and vomiting.  Genitourinary:  Negative for dysuria and hematuria.  Musculoskeletal:  Negative for arthralgias and back pain.  Skin:  Negative for color change and rash.  Neurological:  Negative for seizures, syncope and headaches.  All other systems reviewed and are negative.  Physical Exam Updated Vital Signs BP (!) 149/78 (BP Location: Right Arm)   Pulse (!) 53   Temp 98.7 F (37.1 C) (Oral)   Resp 16   SpO2 96%   Physical Exam Vitals and nursing note reviewed.  Constitutional:      General: She is not in acute distress.    Appearance: She is well-developed.  HENT:     Head: Normocephalic and atraumatic.  Eyes:     Conjunctiva/sclera: Conjunctivae normal.  Cardiovascular:     Rate and Rhythm: Normal rate and regular rhythm.     Heart sounds: No murmur heard. Pulmonary:     Effort: Pulmonary effort is normal. No  respiratory distress.     Breath sounds: Normal breath sounds.  Chest:     Chest wall: Tenderness present.       Comments: Tenderness to palpation over the right chest wall.  No visible bruising, no deformities, no flail chest Abdominal:     Palpations: Abdomen is soft.     Tenderness: There is no abdominal tenderness.  Musculoskeletal:        General: Tenderness present. No deformity. Normal range of motion.     Cervical back: Neck supple.     Comments: No C, T, L-spine tenderness.  5/5 strength in upper and lower extremities.  No noticeable step-offs, crepitus, fluctuance, erythema.  Sensations intact.  Full range of motion and strength of neck. Moving all 4 extremities without difficulty.     Skin:    General: Skin is warm and dry.  Neurological:     General: No focal deficit present.     Mental Status: She is alert and oriented to person, place, and time.  Psychiatric:        Mood and Affect: Mood normal.        Behavior: Behavior normal.    ED Results / Procedures / Treatments   Labs (all labs ordered are listed, but only abnormal results are displayed) Labs Reviewed  CBC - Abnormal; Notable for the following components:      Result Value   Platelets 138 (*)    All other components within normal limits  COMPREHENSIVE METABOLIC PANEL - Abnormal; Notable for the following components:   Glucose, Bld 168 (*)    Albumin 3.4 (*)    All other components within normal limits    EKG EKG Interpretation  Date/Time:  Sunday May 30 2021 15:58:23 EDT Ventricular Rate:  57  PR Interval:  162 QRS Duration: 88 QT Interval:  410 QTC Calculation: 399 R Axis:   48 Text Interpretation: Sinus bradycardia Otherwise normal ECG since last tracing no significant change Confirmed by Malvin Johns [phone]) on 05/30/2021 4:52:54 PM  Radiology DG Chest 2 View  Result Date: 05/30/2021 CLINICAL DATA:  Right-sided chest pain after fall yesterday. EXAM: CHEST - 2 VIEW COMPARISON:   08/11/2017 FINDINGS: Lungs are adequately inflated and otherwise clear. Cardiomediastinal silhouette and remainder of the exam is unchanged. No right rib fracture. IMPRESSION: No acute findings. Electronically Signed   By: Marin Olp M.D.   On: 05/30/2021 16:26   CT Chest Wo Contrast  Result Date: 05/30/2021 CLINICAL DATA:  Right chest and back pain status post fall yesterday EXAM: CT CHEST WITHOUT CONTRAST TECHNIQUE: Multidetector CT imaging of the chest was performed following the standard protocol without IV contrast. COMPARISON:  CT abdomen September 09, 2020 FINDINGS: Cardiovascular: Aortic atherosclerosis without aneurysmal dilation. Coronary artery calcifications. Normal size heart. No significant pericardial effusion/thickening. Mediastinum/Nodes: No discrete thyroid nodule. No pathologically enlarged mediastinal, hilar or axillary lymph nodes, noting limited sensitivity for the detection of hilar adenopathy on this noncontrast study. The trachea esophagus are grossly unremarkable. Lungs/Pleura: Tiny left upper lobe calcified granuloma. No focal consolidation. No suspicious pulmonary nodules or masses. No pleural effusion. No pneumothorax. No evidence of parenchymal contusion or laceration. Upper Abdomen: Similar changes of hepatic cirrhosis. No acute abnormality. Musculoskeletal: Vascular calcifications in the bilateral breast. No acute osseous abnormality. IMPRESSION: 1. No evidence of acute traumatic injury to the chest. Specifically no rib fracture visualized. 2. Cirrhotic hepatic morphology. 3.  Aortic Atherosclerosis (ICD10-I70.0). Electronically Signed   By: Dahlia Bailiff MD   On: 05/30/2021 18:37    Procedures Procedures   Medications Ordered in ED Medications  morphine 2 MG/ML injection 2 mg (2 mg Intravenous Given 05/30/21 1657)  ondansetron (ZOFRAN) injection 4 mg (4 mg Intravenous Given 05/30/21 1703)  HYDROmorphone (DILAUDID) injection 1 mg (1 mg Intravenous Given 05/30/21 1906)     ED Course  I have reviewed the triage vital signs and the nursing notes.  Pertinent labs & imaging results that were available during my care of the patient were reviewed by me and considered in my medical decision making (see chart for details).    MDM Rules/Calculators/A&P                          65 year old female with complaints of right-sided chest wall pain after a fall yesterday.  On arrival, she is moaning, uncomfortable appearing, however nontoxic, no acute distress, resting comfortably in ER bed.  She has reproducible right-sided chest wall tenderness, though no visible bruising, deformities, no flail chest.  She has no midline tenderness to the cervical, thoracic or L-spine.  Patient denies any head injury.  Moving all 4 extremities without difficulty.  Plain films without evidence of fractures, though the patient did appear very uncomfortable and was crying out in pain.  Unfortunately the patient has a listed contrast allergy, and thus I ordered a CT of the chest without contrast to rule out further possible fracture.  Basic lab work overall unremarkable, she does have notable thrombocytopenia of 138 which appears to be at baseline.  CT of the chest without contrast was overall reassuring with no evidence of rib fractures.  Low suspicion for internal bleeding.  On reevaluation, pain is slightly improved with morphine though the patient did request another dose.  She  received Dilaudid.  Plan for discharge with close PCP follow-up, will provide short course of Norco, PDMP reviewed, appropriate.  Patient was bradycardic here throughout the ED course though this does appear to be more or less at baseline.  Discussed return precautions.  She was understanding and is agreeable.  Stable for discharge.  Case discussed with Dr. Tamera Punt who is agreeable to the above plan and disposition Final Clinical Impression(s) / ED Diagnoses Final diagnoses:  Fall, initial encounter  Chest wall pain     Rx / DC Orders ED Discharge Orders          Ordered    HYDROcodone-acetaminophen (NORCO/VICODIN) 5-325 MG tablet  Every 4 hours PRN        05/30/21 1909             Garald Balding, PA-C 05/30/21 1917    Malvin Johns, MD 05/30/21 2332

## 2021-05-30 NOTE — ED Notes (Signed)
Patient transported to CT 

## 2021-05-30 NOTE — ED Triage Notes (Signed)
Pt slipped and fell while cleaning mirror in bathroom yesterday and hit R breast against sink.  C/o pain to R breast, R chest, and R upper back with SOB.

## 2021-05-30 NOTE — Discharge Instructions (Signed)
Fue evaluado en el [facility] y despus de una evaluacin cuidadosa, no encontramos ninguna condicin emergente que requiera ingreso o pruebas adicionales en el hospital.  Sus escaneos de hoy fueron en general tranquilizadores. Por favor tome ibuprofeno, use el Norco si el dolor no se controla con ibuprofeno. Asegrese de hacer un seguimiento con su mdico de atencin primaria dentro de 1 semana.  Regrese al [facility] si experimenta algn empeoramiento de su condicin. Gracias por permitirnos ser parte de su cuidado.     You were evaluated in the Emergency Department and after careful evaluation, we did not find any emergent condition requiring admission or further testing in the hospital.  Your scans today were overall reassuring.  Please take ibuprofen, use the Norco with pain is not controlled with ibuprofen.  Please make sure to follow-up with your primary care doctor within 1 week.  Please return to the Emergency Department if you experience any worsening of your condition.  Thank you for allowing [place] to be a part of your care.

## 2021-06-03 ENCOUNTER — Other Ambulatory Visit: Payer: Self-pay

## 2021-06-08 ENCOUNTER — Encounter: Payer: Self-pay | Admitting: Critical Care Medicine

## 2021-06-08 ENCOUNTER — Other Ambulatory Visit: Payer: Self-pay

## 2021-06-08 ENCOUNTER — Ambulatory Visit: Payer: Self-pay | Attending: Nurse Practitioner | Admitting: Critical Care Medicine

## 2021-06-08 VITALS — BP 153/71 | HR 54 | Ht <= 58 in | Wt 152.2 lb

## 2021-06-08 DIAGNOSIS — K2100 Gastro-esophageal reflux disease with esophagitis, without bleeding: Secondary | ICD-10-CM

## 2021-06-08 DIAGNOSIS — I10 Essential (primary) hypertension: Secondary | ICD-10-CM

## 2021-06-08 DIAGNOSIS — I25118 Atherosclerotic heart disease of native coronary artery with other forms of angina pectoris: Secondary | ICD-10-CM

## 2021-06-08 DIAGNOSIS — R1013 Epigastric pain: Secondary | ICD-10-CM | POA: Insufficient documentation

## 2021-06-08 DIAGNOSIS — E7800 Pure hypercholesterolemia, unspecified: Secondary | ICD-10-CM

## 2021-06-08 DIAGNOSIS — E0800 Diabetes mellitus due to underlying condition with hyperosmolarity without nonketotic hyperglycemic-hyperosmolar coma (NKHHC): Secondary | ICD-10-CM

## 2021-06-08 NOTE — Assessment & Plan Note (Signed)
Blood pressure mildly elevated we will continue current medication

## 2021-06-08 NOTE — Assessment & Plan Note (Signed)
Continue lipid therapy

## 2021-06-08 NOTE — Assessment & Plan Note (Signed)
Chest pain unclear if his coronary will refer to cardiology note EKG was normal

## 2021-06-08 NOTE — Assessment & Plan Note (Signed)
Type 2 diabetes with associated neuropathy  Continue insulin glargine and glipizide as prescribed  Follow-up lab data

## 2021-06-08 NOTE — Progress Notes (Signed)
Established Patient Office Visit  Subjective:  Patient ID: Brandi Bryant, female    DOB: 01-19-1956  Age: 65 y.o. MRN: [id number]  CC: Former PCP F[place]   not seen since 08/2020  HPI Brandi Bryant presents for PCP to establish This visit was assisted by Spanish interpreter Jesus [phone] This patient has previously been followed by Dr. Chapman Fitch not seen since October 2021.  The patient did come to the emergency room in July after falling off a chair with some chest wall pain CT scan of the chest was negative EKG normal.  She has had chronic epigastric pain with nausea and constipation.  Also diagnosed with esophagitis and has been on Protonix.  She states when she takes a protonic she gets more nauseated and has more abdominal pain.  Note patient had a prior history of left breast cancer in 2009 she does need a follow-up mammogram.  Note patient has increased chest discomfort as well without radiation to the neck or arms.  She has had a prior history of coronary disease previous stroke with carotid stenosis.  She notes a burning sensation in the chest and associated increased heart rate with vision changes and flashing of light some dizziness and lightheadedness.  She does not check her blood pressure at that time.  Note her blood sugars for her diabetes have been stable in the 100-110 range.  She is on the insulin glargine 32 units daily along with glipizide.  She denies any hypoglycemic attacks.  The patient has not seen cardiology and a prolonged period of time   Past Medical History:  Diagnosis Date   Abdominal pain    Allergy    Arthralgia 08/13/2013   Bell palsy 2006   Bell's palsy    Breast cancer ([place]) 2009   Left Breast Cancer   Cataract    Chest pain 09/30/2013   Cholelithiasis    Chronic sinusitis 05/16/2017   Colon cancer screening 93/05/9023   Complication of anesthesia    " tubal ligation, in [place]"  had weakness, couldnt stand up the next day, was given  pills for oxygen for Brain."  1 month after I was having loss of attentiviness, still have to focus  carefully.    Coronary artery disease    CVA (cerebral vascular accident) ([place]) 03/30/2018   Depression    Diabetes mellitus    Type II   Dyspnea    at times- when air conditioner is runnimg, heat also    Encounter for screening colonoscopy 10/30/2015   GERD (gastroesophageal reflux disease)    Hepatic steatosis    History of breast cancer 06/01/2012   HTN (hypertension)    Hx of adenomatous polyp of colon 11/07/2019   Hyperlipidemia    Internal carotid artery stenosis 04/03/2018   Personal history of chemotherapy 2009   Left Breast Cancer   Personal history of radiation therapy 2009   Left Breast Cancer   Stroke [facility])    2006, 2015   TIA (transient ischemic attack) 02/20/2014    Past Surgical History:  Procedure Laterality Date   ANKLE ARTHROSCOPY Right 12/14/2016   Procedure: Right Ankle Arthroscopic Debridement;  Surgeon: Newt Minion, MD;  Location: [place];  Service: Orthopedics;  Laterality: Right;   Arm surgery     Left tendon lengthen   BREAST LUMPECTOMY Left 2009   CHOLECYSTECTOMY N/A 12/09/2015   Procedure: LAPAROSCOPIC CHOLECYSTECTOMY WITH INTRAOPERATIVE CHOLANGIOGRAM;  Surgeon: Greer Pickerel, MD;  Location: [place];  Service: General;  Laterality: N/A;  LEFT HEART CATHETERIZATION WITH CORONARY ANGIOGRAM N/A 10/30/2013   Procedure: LEFT HEART CATHETERIZATION WITH CORONARY ANGIOGRAM;  Surgeon: Josue Hector, MD;  Location: [facility] CATH LAB;  Service: Cardiovascular;  Laterality: N/A;   porta cath Right    Removal of Porta cath Right    TUBAL LIGATION      Family History  Problem Relation Age of Onset   Thyroid cancer Sister    Hypertension Mother    Diabetes Mother    Migraines Daughter    Colon cancer Neg Hx    Esophageal cancer Neg Hx    Stomach cancer Neg Hx    Rectal cancer Neg Hx     Social History   Socioeconomic History   Marital status: Single    Spouse name:  Not on file   Number of children: 6   Years of education: Not on file   Highest education level: High school graduate  Occupational History   Not on file  Tobacco Use   Smoking status: Never   Smokeless tobacco: Never  Vaping Use   Vaping Use: Never used  Substance and Sexual Activity   Alcohol use: No   Drug use: No   Sexual activity: Not Currently    Birth control/protection: Surgical  Other Topics Concern   Not on file  Social History Narrative   Single with 6 children    lives with her daughter   Right handed   Never smoker no drug use no alcohol   Social Determinants of Radio broadcast assistant Strain: Not on file  Food Insecurity: Not on file  Transportation Needs: Not on file  Physical Activity: Not on file  Stress: Not on file  Social Connections: Not on file  Intimate Partner Violence: Not on file    Outpatient Medications Prior to Visit  Medication Sig Dispense Refill   aspirin 325 MG EC tablet Take 1 tablet (325 mg total) by mouth daily. 30 tablet 6   atorvastatin (LIPITOR) 40 MG tablet TAKE 1 TABLET (40 MG TOTAL) BY MOUTH DAILY. 30 tablet 6   carvedilol (COREG) 12.5 MG tablet TAKE 1 TABLET (12.5 MG TOTAL) BY MOUTH 2 (TWO) TIMES DAILY WITH A MEAL. 60 tablet 6   glipiZIDE (GLUCOTROL) 10 MG tablet TAKE 1 TABLET (10 MG TOTAL) BY MOUTH 2 (TWO) TIMES DAILY BEFORE A MEAL. 60 tablet 1   insulin glargine (LANTUS SOLOSTAR) 100 UNIT/ML Solostar Pen Inject 32 Units into the skin 2 (two) times daily. (Patient taking differently: Inject 32 Units into the skin daily.) 30 mL 6   Insulin Pen Needle (TRUEPLUS PEN NEEDLES) 32G X 4 MM MISC USE TO INJECT LANTUS DAILY 100 each 2   lisinopril (ZESTRIL) 10 MG tablet TAKE 1 TABLET (10 MG TOTAL) BY MOUTH DAILY. 30 tablet 6   topiramate (TOPAMAX) 50 MG tablet TAKE 1 TABLET (50 MG TOTAL) BY MOUTH 2 (TWO) TIMES DAILY. 60 tablet 3   traZODone (DESYREL) 100 MG tablet TAKE 1 TABLET (100 MG TOTAL) BY MOUTH AT BEDTIME AS NEEDED FOR SLEEP.  30 tablet 3   pantoprazole (PROTONIX) 40 MG tablet TAKE 1 TABLET (40 MG TOTAL) BY MOUTH 2 (TWO) TIMES DAILY. TO REDUCE STOMACH ACID 60 tablet 4   rizatriptan (MAXALT) 5 MG tablet TAKE 1 TABLET (5 MG TOTAL) BY MOUTH AS NEEDED FOR MIGRAINE. MAY REPEAT IN 2 HOURS IF NEEDED (Patient not taking: Reported on 06/08/2021) 10 tablet 3   0.9 %  sodium chloride infusion  No facility-administered medications prior to visit.    Allergies  Allergen Reactions   Gadolinium Derivatives Nausea And Vomiting    Code: VOM, Desc: Pt began vomiting 45 sec after MRI contrast injection of Multihance, Onset Date: [id number]     Iodinated Diagnostic Agents Nausea And Vomiting    ROS Review of Systems  HENT: Negative.    Respiratory:  Positive for cough, chest tightness and shortness of breath.   Cardiovascular:  Positive for chest pain.  Gastrointestinal:  Positive for abdominal distention, abdominal pain and constipation. Negative for anal bleeding, blood in stool, diarrhea, nausea, rectal pain and vomiting.  Genitourinary: Negative.      Objective:    Physical Exam Constitutional:      Appearance: Normal appearance. She is obese.  HENT:     Head: Normocephalic and atraumatic.     Nose: Nose normal.     Mouth/Throat:     Mouth: Mucous membranes are dry.     Pharynx: Oropharynx is clear.  Eyes:     Extraocular Movements: Extraocular movements intact.     Conjunctiva/sclera: Conjunctivae normal.     Pupils: Pupils are equal, round, and reactive to light.  Cardiovascular:     Rate and Rhythm: Normal rate and regular rhythm.     Pulses: Normal pulses.     Heart sounds: Normal heart sounds.  Abdominal:     General: Abdomen is flat. Bowel sounds are normal.     Palpations: Abdomen is soft.  Musculoskeletal:        General: Normal range of motion.     Cervical back: Normal range of motion and neck supple.  Skin:    General: Skin is warm and dry.  Neurological:     General: No focal deficit  present.     Mental Status: She is alert.  Psychiatric:        Mood and Affect: Mood normal.        Behavior: Behavior normal.        Thought Content: Thought content normal.    BP (!) 153/71   Pulse (!) 54   Ht 4\' 8"  (1.422 m)   Wt 152 lb 3.2 oz (69 kg)   SpO2 98%   BMI 34.12 kg/m  Wt Readings from Last 3 Encounters:  06/08/21 152 lb 3.2 oz (69 kg)  05/13/21 151 lb (68.5 kg)  09/16/20 157 lb 3.2 oz (71.3 kg)     Health Maintenance Due  Topic Date Due   COVID-19 Vaccine (1) Never done   OPHTHALMOLOGY EXAM  Never done   Zoster Vaccines- Shingrix (1 of 2) Never done   FOOT EXAM  08/16/2019   MAMMOGRAM  01/26/2020    There are no preventive care reminders to display for this patient.  Lab Results  Component Value Date   TSH 1.500 02/11/2020   Lab Results  Component Value Date   WBC 6.0 05/30/2021   HGB 14.0 05/30/2021   HCT 41.0 05/30/2021   MCV 89.9 05/30/2021   PLT 138 (L) 05/30/2021   Lab Results  Component Value Date   NA 139 05/30/2021   K 4.2 05/30/2021   CHLORIDE 104 09/30/2013   CO2 26 05/30/2021   GLUCOSE 168 (H) 05/30/2021   BUN 15 05/30/2021   CREATININE 0.73 05/30/2021   BILITOT 1.2 05/30/2021   ALKPHOS 83 05/30/2021   AST 34 05/30/2021   ALT 22 05/30/2021   PROT 6.8 05/30/2021   ALBUMIN 3.4 (L) 05/30/2021   CALCIUM 9.4 05/30/2021  ANIONGAP 8 05/30/2021   GFR 84.64 10/29/2019   Lab Results  Component Value Date   CHOL 113 12/02/2020   Lab Results  Component Value Date   HDL 41 12/02/2020   Lab Results  Component Value Date   LDLCALC 52 12/02/2020   Lab Results  Component Value Date   TRIG 107 12/02/2020   Lab Results  Component Value Date   CHOLHDL 2.8 12/02/2020   Lab Results  Component Value Date   HGBA1C 6.7 (A) 05/17/2021      Assessment & Plan:   Problem List Items Addressed This Visit       Cardiovascular and Mediastinum   HTN (hypertension)    Blood pressure mildly elevated we will continue current  medication       CAD (coronary artery disease), native coronary artery - Primary    Chest pain unclear if his coronary will refer to cardiology note EKG was normal       Relevant Orders   Ambulatory referral to Cardiology     Digestive   GERD (gastroesophageal reflux disease)     Endocrine   Diabetes mellitus ([place])    Type 2 diabetes with associated neuropathy  Continue insulin glargine and glipizide as prescribed  Follow-up lab data         Other   Hyperlipidemia    Continue lipid therapy       Epigastric pain    Epigastric abdominal pain with prior history of hepatic steatosis and reflux disease increased pain with Protonix  Discontinue Protonix  Referral to gastroenterology was made       Relevant Orders   Ambulatory referral to Gastroenterology    No orders of the defined types were placed in this encounter.   Follow-up: Return in about 2 months (around 08/09/2021).    Asencion Noble, MD

## 2021-06-08 NOTE — Patient Instructions (Addendum)
Refills on all medicines sent  Stop the Protonix  Referral to cardiology was made  Referral to gastroenterology was made  No change in diabetic medications  Return to Dr. Joya Gaskins 2 months

## 2021-06-08 NOTE — Assessment & Plan Note (Signed)
Epigastric abdominal pain with prior history of hepatic steatosis and reflux disease increased pain with Protonix  Discontinue Protonix  Referral to gastroenterology was made

## 2021-06-17 ENCOUNTER — Other Ambulatory Visit: Payer: Self-pay

## 2021-07-02 ENCOUNTER — Ambulatory Visit: Payer: Self-pay | Admitting: Nurse Practitioner

## 2021-07-05 ENCOUNTER — Other Ambulatory Visit: Payer: Self-pay

## 2021-07-05 ENCOUNTER — Other Ambulatory Visit: Payer: Self-pay | Admitting: Physician Assistant

## 2021-07-05 DIAGNOSIS — E1140 Type 2 diabetes mellitus with diabetic neuropathy, unspecified: Secondary | ICD-10-CM

## 2021-07-06 ENCOUNTER — Other Ambulatory Visit: Payer: Self-pay

## 2021-07-06 MED ORDER — GLIPIZIDE 10 MG PO TABS
ORAL_TABLET | Freq: Two times a day (BID) | ORAL | 0 refills | Status: DC
  Filled 2021-07-06: qty 60, fill #0
  Filled 2021-07-29: qty 60, 30d supply, fill #0

## 2021-07-15 ENCOUNTER — Emergency Department (HOSPITAL_COMMUNITY): Payer: Self-pay

## 2021-07-15 ENCOUNTER — Inpatient Hospital Stay (HOSPITAL_COMMUNITY)
Admission: EM | Admit: 2021-07-15 | Discharge: 2021-07-19 | DRG: 872 | Disposition: A | Discharge status: Home / Self Care | Payer: Self-pay | Attending: Internal Medicine | Admitting: Internal Medicine

## 2021-07-15 ENCOUNTER — Other Ambulatory Visit: Payer: Self-pay

## 2021-07-15 DIAGNOSIS — Z79899 Other long term (current) drug therapy: Secondary | ICD-10-CM

## 2021-07-15 DIAGNOSIS — N10 Acute tubulo-interstitial nephritis: Secondary | ICD-10-CM | POA: Diagnosis present

## 2021-07-15 DIAGNOSIS — I10 Essential (primary) hypertension: Secondary | ICD-10-CM | POA: Diagnosis present

## 2021-07-15 DIAGNOSIS — E785 Hyperlipidemia, unspecified: Secondary | ICD-10-CM | POA: Diagnosis present

## 2021-07-15 DIAGNOSIS — N12 Tubulo-interstitial nephritis, not specified as acute or chronic: Secondary | ICD-10-CM | POA: Diagnosis present

## 2021-07-15 DIAGNOSIS — E1140 Type 2 diabetes mellitus with diabetic neuropathy, unspecified: Secondary | ICD-10-CM | POA: Diagnosis present

## 2021-07-15 DIAGNOSIS — Z923 Personal history of irradiation: Secondary | ICD-10-CM

## 2021-07-15 DIAGNOSIS — Z794 Long term (current) use of insulin: Secondary | ICD-10-CM

## 2021-07-15 DIAGNOSIS — K219 Gastro-esophageal reflux disease without esophagitis: Secondary | ICD-10-CM | POA: Diagnosis present

## 2021-07-15 DIAGNOSIS — Z7982 Long term (current) use of aspirin: Secondary | ICD-10-CM

## 2021-07-15 DIAGNOSIS — I2510 Atherosclerotic heart disease of native coronary artery without angina pectoris: Secondary | ICD-10-CM | POA: Diagnosis present

## 2021-07-15 DIAGNOSIS — Z20822 Contact with and (suspected) exposure to covid-19: Secondary | ICD-10-CM | POA: Diagnosis present

## 2021-07-15 DIAGNOSIS — E1142 Type 2 diabetes mellitus with diabetic polyneuropathy: Secondary | ICD-10-CM

## 2021-07-15 DIAGNOSIS — D696 Thrombocytopenia, unspecified: Secondary | ICD-10-CM | POA: Diagnosis present

## 2021-07-15 DIAGNOSIS — Z833 Family history of diabetes mellitus: Secondary | ICD-10-CM

## 2021-07-15 DIAGNOSIS — Z9851 Tubal ligation status: Secondary | ICD-10-CM

## 2021-07-15 DIAGNOSIS — Z9049 Acquired absence of other specified parts of digestive tract: Secondary | ICD-10-CM

## 2021-07-15 DIAGNOSIS — E1165 Type 2 diabetes mellitus with hyperglycemia: Secondary | ICD-10-CM | POA: Diagnosis present

## 2021-07-15 DIAGNOSIS — Z8249 Family history of ischemic heart disease and other diseases of the circulatory system: Secondary | ICD-10-CM

## 2021-07-15 DIAGNOSIS — Z853 Personal history of malignant neoplasm of breast: Secondary | ICD-10-CM

## 2021-07-15 DIAGNOSIS — G43909 Migraine, unspecified, not intractable, without status migrainosus: Secondary | ICD-10-CM | POA: Diagnosis present

## 2021-07-15 DIAGNOSIS — Z888 Allergy status to other drugs, medicaments and biological substances status: Secondary | ICD-10-CM

## 2021-07-15 DIAGNOSIS — Z91041 Radiographic dye allergy status: Secondary | ICD-10-CM

## 2021-07-15 DIAGNOSIS — K7469 Other cirrhosis of liver: Secondary | ICD-10-CM | POA: Diagnosis present

## 2021-07-15 DIAGNOSIS — Z8673 Personal history of transient ischemic attack (TIA), and cerebral infarction without residual deficits: Secondary | ICD-10-CM

## 2021-07-15 DIAGNOSIS — N179 Acute kidney failure, unspecified: Secondary | ICD-10-CM | POA: Diagnosis present

## 2021-07-15 DIAGNOSIS — Z9221 Personal history of antineoplastic chemotherapy: Secondary | ICD-10-CM

## 2021-07-15 DIAGNOSIS — Z28310 Unvaccinated for covid-19: Secondary | ICD-10-CM

## 2021-07-15 DIAGNOSIS — F32A Depression, unspecified: Secondary | ICD-10-CM | POA: Diagnosis present

## 2021-07-15 DIAGNOSIS — Z7984 Long term (current) use of oral hypoglycemic drugs: Secondary | ICD-10-CM

## 2021-07-15 DIAGNOSIS — A419 Sepsis, unspecified organism: Principal | ICD-10-CM | POA: Diagnosis present

## 2021-07-15 LAB — URINALYSIS, ROUTINE W REFLEX MICROSCOPIC
Bilirubin Urine: NEGATIVE
Glucose, UA: 150 mg/dL — AB
Hgb urine dipstick: NEGATIVE
Ketones, ur: NEGATIVE mg/dL
Nitrite: NEGATIVE
Protein, ur: NEGATIVE mg/dL
Specific Gravity, Urine: 1.013 (ref 1.005–1.030)
pH: 5 (ref 5.0–8.0)

## 2021-07-15 LAB — COMPREHENSIVE METABOLIC PANEL
ALT: 30 U/L (ref 0–44)
AST: 28 U/L (ref 15–41)
Albumin: 3.1 g/dL — ABNORMAL LOW (ref 3.5–5.0)
Alkaline Phosphatase: 75 U/L (ref 38–126)
Anion gap: 10 (ref 5–15)
BUN: 29 mg/dL — ABNORMAL HIGH (ref 8–23)
CO2: 20 mmol/L — ABNORMAL LOW (ref 22–32)
Calcium: 8.6 mg/dL — ABNORMAL LOW (ref 8.9–10.3)
Chloride: 104 mmol/L (ref 98–111)
Creatinine, Ser: 1.19 mg/dL — ABNORMAL HIGH (ref 0.44–1.00)
GFR, Estimated: 51 mL/min — ABNORMAL LOW (ref >60)
Glucose, Bld: 304 mg/dL — ABNORMAL HIGH (ref 70–99)
Potassium: 4.4 mmol/L (ref 3.5–5.1)
Sodium: 134 mmol/L — ABNORMAL LOW (ref 135–145)
Total Bilirubin: 1.8 mg/dL — ABNORMAL HIGH (ref 0.3–1.2)
Total Protein: 6.4 g/dL — ABNORMAL LOW (ref 6.5–8.1)

## 2021-07-15 LAB — CBC WITH DIFFERENTIAL/PLATELET
Abs Immature Granulocytes: 0.1 10*3/uL — ABNORMAL HIGH (ref 0.00–0.07)
Basophils Absolute: 0.1 10*3/uL (ref 0.0–0.1)
Basophils Relative: 0 %
Eosinophils Absolute: 0.1 10*3/uL (ref 0.0–0.5)
Eosinophils Relative: 1 %
HCT: 40.2 % (ref 36.0–46.0)
Hemoglobin: 13.4 g/dL (ref 12.0–15.0)
Immature Granulocytes: 1 %
Lymphocytes Relative: 8 %
Lymphs Abs: 1.1 10*3/uL (ref 0.7–4.0)
MCH: 30.4 pg (ref 26.0–34.0)
MCHC: 33.3 g/dL (ref 30.0–36.0)
MCV: 91.2 fL (ref 80.0–100.0)
Monocytes Absolute: 0.9 10*3/uL (ref 0.1–1.0)
Monocytes Relative: 7 %
Neutro Abs: 11.6 10*3/uL — ABNORMAL HIGH (ref 1.7–7.7)
Neutrophils Relative %: 83 %
Platelets: 127 10*3/uL — ABNORMAL LOW (ref 150–400)
RBC: 4.41 MIL/uL (ref 3.87–5.11)
RDW: 12.8 % (ref 11.5–15.5)
WBC: 13.8 10*3/uL — ABNORMAL HIGH (ref 4.0–10.5)
nRBC: 0 % (ref 0.0–0.2)

## 2021-07-15 LAB — RESP PANEL BY RT-PCR (FLU A&B, COVID) ARPGX2
Influenza A by PCR: NEGATIVE
Influenza B by PCR: NEGATIVE
SARS Coronavirus 2 by RT PCR: NEGATIVE

## 2021-07-15 MED ORDER — SODIUM CHLORIDE 0.9 % IV SOLN
1.0000 g | Freq: Once | INTRAVENOUS | Status: AC
  Administered 2021-07-15: 1 g via INTRAVENOUS
  Filled 2021-07-15: qty 10

## 2021-07-15 MED ORDER — ACETAMINOPHEN 325 MG PO TABS
650.0000 mg | ORAL_TABLET | Freq: Once | ORAL | Status: AC
  Administered 2021-07-15: 650 mg via ORAL
  Filled 2021-07-15: qty 2

## 2021-07-15 MED ORDER — ONDANSETRON HCL 4 MG/2ML IJ SOLN
4.0000 mg | Freq: Once | INTRAMUSCULAR | Status: AC
  Administered 2021-07-15: 4 mg via INTRAVENOUS
  Filled 2021-07-15: qty 2

## 2021-07-15 MED ORDER — ACETAMINOPHEN 325 MG PO TABS: 325.0000 mg | ORAL_TABLET | Freq: Once | ORAL | Status: DC

## 2021-07-15 MED ORDER — FENTANYL CITRATE PF 50 MCG/ML IJ SOSY
50.0000 ug | PREFILLED_SYRINGE | Freq: Once | INTRAMUSCULAR | Status: AC
Start: 2021-07-15 — End: 2021-07-15
  Administered 2021-07-15: 50 ug via INTRAVENOUS
  Filled 2021-07-15: qty 1

## 2021-07-15 MED ORDER — SODIUM CHLORIDE 0.9 % IV SOLN: 1.0000 g | INTRAVENOUS | Status: DC

## 2021-07-15 MED ORDER — SODIUM CHLORIDE 0.9 % IV BOLUS
2000.0000 mL | Freq: Once | INTRAVENOUS | Status: AC
  Administered 2021-07-15: 2000 mL via INTRAVENOUS

## 2021-07-15 MED ORDER — LACTATED RINGERS IV SOLN: INTRAVENOUS | Status: AC

## 2021-07-15 NOTE — ED Triage Notes (Signed)
Pt c/o HA, lower back pain, fever (did not measure) x3 days. Endorses dysuria for same.

## 2021-07-15 NOTE — ED Notes (Addendum)
Went in to pt's room to answer call light. Pt's son on the phone saying patient is c/o chills/shakes. Pt is shaking at bedside, temp done and read 101.4. Pt also abruptly needed to get up and go to the restroom, brought in bedside commode. Pt soiled herself while attempting to use commode, but most was caught in commode. Pt changed cleaned. Full set of vitals documented: 101.4, RR-20, HR-94, O2-100%, BP-144/63. MD made aware.

## 2021-07-15 NOTE — ED Provider Notes (Signed)
[place] EMERGENCY DEPARTMENT Provider Note   CSN: [id number] Arrival date & time: 07/15/21  1514     History Chief Complaint  Patient presents with   Headache   Back Pain    Brandi Bryant is a 65 y.o. female.  Unvaccinated for COVID-19. Took home test that was negative.  The history is provided by the patient. A language interpreter was used.  Fever Temp source:  Subjective Severity:  Moderate Onset quality:  Gradual Duration:  3 days Timing:  Constant Progression:  Unchanged Chronicity:  New Relieved by:  Nothing Worsened by:  Nothing Ineffective treatments:  Ibuprofen Associated symptoms: chills, diarrhea, dysuria, headaches, myalgias, nausea and vomiting   Associated symptoms: no chest pain, no confusion, no cough, no ear pain, no rash and no sore throat       Past Medical History:  Diagnosis Date   Abdominal pain    Allergy    Arthralgia 08/13/2013   Bell palsy 2006   Bell's palsy    Breast cancer ([place]) 2009   Left Breast Cancer   Cataract    Chest pain 09/30/2013   Cholelithiasis    Chronic sinusitis 05/16/2017   Colon cancer screening [id number]   Complication of anesthesia    " tubal ligation, in [place]"  had weakness, couldnt stand up the next day, was given pills for oxygen for Brain."  1 month after I was having loss of attentiviness, still have to focus  carefully.    Coronary artery disease    CVA (cerebral vascular accident) ([place]) 03/30/2018   Depression    Diabetes mellitus    Type II   Dyspnea    at times- when air conditioner is runnimg, heat also    Encounter for screening colonoscopy 10/30/2015   GERD (gastroesophageal reflux disease)    Hepatic steatosis    History of breast cancer 06/01/2012   HTN (hypertension)    Hx of adenomatous polyp of colon 11/07/2019   Hyperlipidemia    Internal carotid artery stenosis 04/03/2018   Personal history of chemotherapy 2009   Left Breast Cancer   Personal history of  radiation therapy 2009   Left Breast Cancer   Stroke ([place])    2006, 2015   TIA (transient ischemic attack) 02/20/2014    Patient Active Problem List   Diagnosis Date Noted   Epigastric pain 06/08/2021   Vitamin D deficiency 05/13/2021   Insomnia 05/13/2021   Kidney lesion, native, left 11/14/2019   Hx of adenomatous polyp of colon 11/07/2019   Carpal tunnel syndrome 11/07/2018   GERD (gastroesophageal reflux disease) 11/27/2017   Diabetic polyneuropathy associated with type 2 diabetes mellitus ([place]) 10/31/2016   Impingement syndrome of right ankle 10/31/2016   Symptomatic cholelithiasis 12/09/2015   Daily headache 02/21/2014   Trigeminal neuralgia 01/22/2014   Weakness 01/22/2014   Bell's palsy 01/21/2014   NAFLD (nonalcoholic fatty liver disease) 01/21/2014   Facial droop 01/15/2014   Stroke ([place]) 01/15/2014   Type 2 diabetes mellitus with diabetic neuropathy ([place]) 12/06/2013   Hyperlipidemia 11/07/2013   CAD (coronary artery disease), native coronary artery 11/07/2013   Chest pain 09/30/2013   History of breast cancer 06/01/2012   HTN (hypertension)    Diabetes mellitus ([place])     Past Surgical History:  Procedure Laterality Date   ANKLE ARTHROSCOPY Right 12/14/2016   Procedure: Right Ankle Arthroscopic Debridement;  Surgeon: Newt Minion, MD;  Location: [place];  Service: Orthopedics;  Laterality: Right;   Arm surgery  Left tendon lengthen   BREAST LUMPECTOMY Left 2009   CHOLECYSTECTOMY N/A 12/09/2015   Procedure: LAPAROSCOPIC CHOLECYSTECTOMY WITH INTRAOPERATIVE CHOLANGIOGRAM;  Surgeon: Greer Pickerel, MD;  Location: [place];  Service: General;  Laterality: N/A;   LEFT HEART CATHETERIZATION WITH CORONARY ANGIOGRAM N/A 10/30/2013   Procedure: LEFT HEART CATHETERIZATION WITH CORONARY ANGIOGRAM;  Surgeon: Josue Hector, MD;  Location: [facility] CATH LAB;  Service: Cardiovascular;  Laterality: N/A;   porta cath Right    Removal of Porta cath Right    TUBAL LIGATION       OB  History     Gravida  6   Para  6   Term  6   Preterm      AB      Living  6      SAB      IAB      Ectopic      Multiple      Live Births              Family History  Problem Relation Age of Onset   Thyroid cancer Sister    Hypertension Mother    Diabetes Mother    Migraines Daughter    Colon cancer Neg Hx    Esophageal cancer Neg Hx    Stomach cancer Neg Hx    Rectal cancer Neg Hx     Social History   Tobacco Use   Smoking status: Never   Smokeless tobacco: Never  Vaping Use   Vaping Use: Never used  Substance Use Topics   Alcohol use: No   Drug use: No    Home Medications Prior to Admission medications   Medication Sig Start Date End Date Taking? Authorizing Provider  aspirin 325 MG EC tablet Take 1 tablet (325 mg total) by mouth daily. 06/14/18   Argentina Donovan, PA-C  atorvastatin (LIPITOR) 40 MG tablet TAKE 1 TABLET (40 MG TOTAL) BY MOUTH DAILY. 05/13/21 05/13/22  Mayers, Cari S, PA-C  carvedilol (COREG) 12.5 MG tablet TAKE 1 TABLET (12.5 MG TOTAL) BY MOUTH 2 (TWO) TIMES DAILY WITH A MEAL. 05/13/21 05/13/22  Mayers, Cari S, PA-C  glipiZIDE (GLUCOTROL) 10 MG tablet TAKE 1 TABLET (10 MG TOTAL) BY MOUTH 2 (TWO) TIMES DAILY BEFORE A MEAL. 07/06/21 07/06/22  Dorna Mai, MD  insulin glargine (LANTUS SOLOSTAR) 100 UNIT/ML Solostar Pen Inject 32 Units into the skin 2 (two) times daily. Patient taking differently: Inject 32 Units into the skin daily. 05/13/21   Mayers, Cari S, PA-C  Insulin Pen Needle (TRUEPLUS PEN NEEDLES) 32G X 4 MM MISC USE TO INJECT LANTUS DAILY 03/08/21   Charlott Rakes, MD  lisinopril (ZESTRIL) 10 MG tablet TAKE 1 TABLET (10 MG TOTAL) BY MOUTH DAILY. 05/13/21 05/13/22  Mayers, Cari S, PA-C  rizatriptan (MAXALT) 5 MG tablet TAKE 1 TABLET (5 MG TOTAL) BY MOUTH AS NEEDED FOR MIGRAINE. MAY REPEAT IN 2 HOURS IF NEEDED Patient not taking: Reported on 06/08/2021 05/13/21 05/13/22  Mayers, Cari S, PA-C  topiramate (TOPAMAX) 50 MG tablet TAKE 1  TABLET (50 MG TOTAL) BY MOUTH 2 (TWO) TIMES DAILY. 05/13/21 05/13/22  Mayers, Cari S, PA-C  traZODone (DESYREL) 100 MG tablet TAKE 1 TABLET (100 MG TOTAL) BY MOUTH AT BEDTIME AS NEEDED FOR SLEEP. 05/13/21 05/13/22  Mayers, Cari S, PA-C  escitalopram (LEXAPRO) 10 MG tablet Take 10 mg by mouth daily.  01/31/12  [provider]    Allergies    Gadolinium derivatives and Iodinated diagnostic agents  Review of Systems   Review of Systems  Constitutional:  Positive for chills and fever.  HENT:  Negative for ear pain and sore throat.   Eyes:  Negative for pain and visual disturbance.  Respiratory:  Negative for cough and shortness of breath.   Cardiovascular:  Negative for chest pain and palpitations.  Gastrointestinal:  Positive for diarrhea, nausea and vomiting. Negative for abdominal pain.  Genitourinary:  Positive for dysuria. Negative for hematuria.  Musculoskeletal:  Positive for myalgias. Negative for arthralgias and back pain.  Skin:  Negative for color change and rash.  Neurological:  Positive for headaches. Negative for seizures and syncope.  Psychiatric/Behavioral:  Negative for confusion.   All other systems reviewed and are negative.  Physical Exam Updated Vital Signs BP 124/67   Pulse 80   Temp 99.5 F (37.5 C) (Oral)   Resp 18   SpO2 100%   Physical Exam Vitals and nursing note reviewed.  HENT:     Head: Normocephalic and atraumatic.  Eyes:     General: No scleral icterus. Pulmonary:     Effort: Pulmonary effort is normal. No respiratory distress.  Abdominal:     Palpations: Abdomen is soft.     Tenderness: There is abdominal tenderness (mild LLQ).     Comments: Mild left CVA TTP  Musculoskeletal:        General: No swelling or tenderness.     Cervical back: Normal range of motion.  Skin:    General: Skin is warm and dry.  Neurological:     Mental Status: She is alert.  Psychiatric:        Mood and Affect: Mood normal.    ED Results / Procedures /  Treatments   Labs (all labs ordered are listed, but only abnormal results are displayed) Labs Reviewed  URINE CULTURE  RESP PANEL BY RT-PCR (FLU A&B, COVID) ARPGX2  URINALYSIS, ROUTINE W REFLEX MICROSCOPIC  CBC WITH DIFFERENTIAL/PLATELET  COMPREHENSIVE METABOLIC PANEL    EKG None  Radiology No results found.  Procedures Procedures   Medications Ordered in ED Medications  fentaNYL (SUBLIMAZE) injection 50 mcg (has no administration in time range)  ondansetron (ZOFRAN) injection 4 mg (has no administration in time range)    ED Course  I have reviewed the triage vital signs and the nursing notes.  Pertinent labs & imaging results that were available during my care of the patient were reviewed by me and considered in my medical decision making (see chart for details).  Clinical Course as of 07/15/21 2341  Thu Jul 15, 2021  2340 I spoke with Dr. Hal Hope who will admit. [AW]    Clinical Course User Index [AW] Arnaldo Natal, MD   MDM Rules/Calculators/A&P                           [name] onto to the ED complaining of 3 days of subjective fever, back pain, headache, nausea, vomiting, and dysuria.  Initially, the differential diagnosis was quite wide and did include COVID-19.  I was also concerned for urinary tract infection versus pyelonephritis, and I even consider diverticulitis given her mild left lower quadrant tenderness.  White count was noted to be slightly elevated.  Several hours into her ED course, she developed an elevated temperature.  Imaging revealed perinephric stranding, and urinalysis corroborated a likely case of pyelonephritis.  She will be admitted for further treatment given her age, comorbidities, and the severity of symptoms. Final  Clinical Impression(s) / ED Diagnoses Final diagnoses:  Pyelonephritis  Type 2 diabetes mellitus with diabetic neuropathy, with long-term current use of insulin [facility])    Rx / DC Orders ED Discharge  Orders     None        Arnaldo Natal, MD 07/15/21 [phone]

## 2021-07-16 ENCOUNTER — Encounter (HOSPITAL_COMMUNITY): Payer: Self-pay | Admitting: Internal Medicine

## 2021-07-16 DIAGNOSIS — E1140 Type 2 diabetes mellitus with diabetic neuropathy, unspecified: Secondary | ICD-10-CM

## 2021-07-16 DIAGNOSIS — N179 Acute kidney failure, unspecified: Secondary | ICD-10-CM | POA: Diagnosis present

## 2021-07-16 DIAGNOSIS — N12 Tubulo-interstitial nephritis, not specified as acute or chronic: Secondary | ICD-10-CM

## 2021-07-16 DIAGNOSIS — Z794 Long term (current) use of insulin: Secondary | ICD-10-CM

## 2021-07-16 LAB — CBG MONITORING, ED
Glucose-Capillary: 191 mg/dL — ABNORMAL HIGH (ref 70–99)
Glucose-Capillary: 225 mg/dL — ABNORMAL HIGH (ref 70–99)
Glucose-Capillary: 126 mg/dL — ABNORMAL HIGH (ref 70–99)

## 2021-07-16 LAB — CBC
HCT: 35.6 % — ABNORMAL LOW (ref 36.0–46.0)
HCT: 36 % (ref 36.0–46.0)
Hemoglobin: 11.8 g/dL — ABNORMAL LOW (ref 12.0–15.0)
Hemoglobin: 12.2 g/dL (ref 12.0–15.0)
MCH: 30.5 pg (ref 26.0–34.0)
MCH: 31.1 pg (ref 26.0–34.0)
MCHC: 33.1 g/dL (ref 30.0–36.0)
MCHC: 33.9 g/dL (ref 30.0–36.0)
MCV: 91.8 fL (ref 80.0–100.0)
MCV: 92 fL (ref 80.0–100.0)
Platelets: 95 10*3/uL — ABNORMAL LOW (ref 150–400)
Platelets: 97 10*3/uL — ABNORMAL LOW (ref 150–400)
RBC: 3.87 MIL/uL (ref 3.87–5.11)
RBC: 3.92 MIL/uL (ref 3.87–5.11)
RDW: 12.9 % (ref 11.5–15.5)
RDW: 13 % (ref 11.5–15.5)
WBC: 9.2 10*3/uL (ref 4.0–10.5)
WBC: 9.5 10*3/uL (ref 4.0–10.5)
nRBC: 0 % (ref 0.0–0.2)
nRBC: 0 % (ref 0.0–0.2)

## 2021-07-16 LAB — COMPREHENSIVE METABOLIC PANEL
ALT: 26 U/L (ref 0–44)
AST: 27 U/L (ref 15–41)
Albumin: 2.4 g/dL — ABNORMAL LOW (ref 3.5–5.0)
Alkaline Phosphatase: 80 U/L (ref 38–126)
Anion gap: 7 (ref 5–15)
BUN: 20 mg/dL (ref 8–23)
CO2: 19 mmol/L — ABNORMAL LOW (ref 22–32)
Calcium: 7.7 mg/dL — ABNORMAL LOW (ref 8.9–10.3)
Chloride: 113 mmol/L — ABNORMAL HIGH (ref 98–111)
Creatinine, Ser: 1 mg/dL (ref 0.44–1.00)
GFR, Estimated: >60 mL/min (ref >60)
Glucose, Bld: 195 mg/dL — ABNORMAL HIGH (ref 70–99)
Potassium: 3.8 mmol/L (ref 3.5–5.1)
Sodium: 139 mmol/L (ref 135–145)
Total Bilirubin: 1.9 mg/dL — ABNORMAL HIGH (ref 0.3–1.2)
Total Protein: 5.5 g/dL — ABNORMAL LOW (ref 6.5–8.1)

## 2021-07-16 LAB — CREATININE, SERUM
Creatinine, Ser: 0.97 mg/dL (ref 0.44–1.00)
GFR, Estimated: >60 mL/min (ref >60)

## 2021-07-16 LAB — LACTIC ACID, PLASMA: Lactic Acid, Venous: 1.4 mmol/L (ref 0.5–1.9)

## 2021-07-16 LAB — HIV ANTIBODY (ROUTINE TESTING W REFLEX): HIV Screen 4th Generation wRfx: NONREACTIVE

## 2021-07-16 MED ORDER — INSULIN ASPART 100 UNIT/ML IJ SOLN
0.0000 [IU] | Freq: Three times a day (TID) | INTRAMUSCULAR | Status: DC
  Administered 2021-07-16: 1 [IU] via SUBCUTANEOUS
  Administered 2021-07-16: 3 [IU] via SUBCUTANEOUS
  Administered 2021-07-17: 1 [IU] via SUBCUTANEOUS
  Administered 2021-07-17: 2 [IU] via SUBCUTANEOUS
  Administered 2021-07-17: 3 [IU] via SUBCUTANEOUS
  Administered 2021-07-19: 2 [IU] via SUBCUTANEOUS

## 2021-07-16 MED ORDER — ENOXAPARIN SODIUM 40 MG/0.4ML IJ SOSY
40.0000 mg | PREFILLED_SYRINGE | Freq: Every day | INTRAMUSCULAR | Status: DC
  Administered 2021-07-16 – 2021-07-19 (×4): 40 mg via SUBCUTANEOUS
  Filled 2021-07-16 (×4): qty 0.4

## 2021-07-16 MED ORDER — ACETAMINOPHEN 325 MG PO TABS
650.0000 mg | ORAL_TABLET | Freq: Four times a day (QID) | ORAL | Status: DC | PRN
  Administered 2021-07-17 – 2021-07-19 (×3): 650 mg via ORAL
  Filled 2021-07-16 (×3): qty 2

## 2021-07-16 MED ORDER — TRAZODONE HCL 100 MG PO TABS
100.0000 mg | ORAL_TABLET | Freq: Every evening | ORAL | Status: DC | PRN
  Administered 2021-07-16: 100 mg via ORAL
  Filled 2021-07-16: qty 2

## 2021-07-16 MED ORDER — SODIUM CHLORIDE 0.9 % IV SOLN
2.0000 g | INTRAVENOUS | Status: DC
  Administered 2021-07-16 – 2021-07-18 (×3): 2 g via INTRAVENOUS
  Filled 2021-07-16 (×3): qty 20

## 2021-07-16 MED ORDER — TOPIRAMATE 25 MG PO TABS
50.0000 mg | ORAL_TABLET | Freq: Two times a day (BID) | ORAL | Status: DC
  Administered 2021-07-16 – 2021-07-19 (×8): 50 mg via ORAL
  Filled 2021-07-16 (×8): qty 2

## 2021-07-16 MED ORDER — OXYCODONE HCL 5 MG PO TABS
5.0000 mg | ORAL_TABLET | Freq: Four times a day (QID) | ORAL | Status: DC | PRN
  Administered 2021-07-16 – 2021-07-18 (×5): 5 mg via ORAL
  Filled 2021-07-16 (×5): qty 1

## 2021-07-16 MED ORDER — CARVEDILOL 12.5 MG PO TABS
12.5000 mg | ORAL_TABLET | Freq: Two times a day (BID) | ORAL | Status: DC
  Administered 2021-07-16 – 2021-07-19 (×6): 12.5 mg via ORAL
  Filled 2021-07-16 (×6): qty 1

## 2021-07-16 MED ORDER — ACETAMINOPHEN 650 MG RE SUPP: 650.0000 mg | Freq: Four times a day (QID) | RECTAL | Status: DC | PRN

## 2021-07-16 MED ORDER — HYDRALAZINE HCL 20 MG/ML IJ SOLN: 10.0000 mg | INTRAMUSCULAR | Status: DC | PRN

## 2021-07-16 MED ORDER — ATORVASTATIN CALCIUM 40 MG PO TABS
40.0000 mg | ORAL_TABLET | Freq: Every day | ORAL | Status: DC
  Administered 2021-07-16 – 2021-07-17 (×2): 40 mg via ORAL
  Filled 2021-07-16 (×2): qty 1

## 2021-07-16 MED ORDER — ASPIRIN EC 325 MG PO TBEC
325.0000 mg | DELAYED_RELEASE_TABLET | Freq: Every day | ORAL | Status: DC
  Administered 2021-07-16 – 2021-07-19 (×4): 325 mg via ORAL
  Filled 2021-07-16 (×4): qty 1

## 2021-07-16 MED ORDER — ONDANSETRON HCL 4 MG/2ML IJ SOLN
4.0000 mg | Freq: Four times a day (QID) | INTRAMUSCULAR | Status: DC | PRN
  Administered 2021-07-16 – 2021-07-17 (×3): 4 mg via INTRAVENOUS
  Filled 2021-07-16 (×3): qty 2

## 2021-07-16 MED ORDER — INSULIN GLARGINE-YFGN 100 UNIT/ML ~~LOC~~ SOLN
30.0000 [IU] | Freq: Every day | SUBCUTANEOUS | Status: DC
  Administered 2021-07-16 – 2021-07-19 (×4): 30 [IU] via SUBCUTANEOUS
  Filled 2021-07-16 (×4): qty 0.3

## 2021-07-16 NOTE — Progress Notes (Signed)
PROGRESS NOTE  Brandi Bryant  DOB: [birth year]  PCP: Charlott Rakes, MD [id number]  DOA: 07/15/2021  LOS: 0 days  Hospital Day: 2   Chief Complaint  Patient presents with   Headache   Back Pain    Brief narrative: Brandi Bryant is a 65 y.o. female with PMH significant for DM2, HTN, migraine, history of pericarditis, breast cancer in remission. Patient presented to the ED on 8/25 with complaints of left flank pain with nausea, vomiting, fever, chills and dysuria for 3 days.    In the ED, she was noted to have a temperature one 1.6, heart rate in 90s, respiratory rate up to 24, blood pressure stable. Labs with WBC count elevated to 13.8, lactic acid normal at 1.4, glucose level elevated to 304, BUN/creatinine 29/1.19. Urinalysis with hazy urine with large amount leukocytes CT abdomen showed some fullness of the left renal collecting system with perinephric stranding, which could have been secondary to acute viral nephritis or edema from recently passed stone. Patient was started on IV Rocephin and admitted to hospitalist service  Subjective: Patient was seen and examined this morning.  Pleasant elderly Hispanic female.  Having active chills.  Complains of pain on left flank on palpation.  Assessment/Plan: Sepsis secondary to acute left pyelonephritis -Presented with fever, dysuria, nausea, vomiting, left-sided pain. -Urinalysis showed hazy urine with large amount leukocytes.  Blood culture and urine culture sent. -Currently on IV Rocephin. -As needed pain meds, antiemetics  Type 2 diabetes mellitus Hyperglycemia -A1c 6.7 in June 2022 -Home meds include Lantus 32 units daily and glipizide 10 mg twice daily.  Patient reports that she had not taken her insulin on the day of admission and hence was running hyperglycemic. -Currently on Lantus 30 units daily.  Glipizide on hold. -Continue sliding scale insulin with Accu-Cheks. Recent Labs  Lab 07/16/21 0146  07/16/21 0820  GLUCAP 191* 225*   AKI -Creatinine elevated by more than 0.3 points from baseline.  Improving with IV hydration.  Continue LR at 75 mill per hour. Recent Labs    09/08/20 1526 09/14/20 2047 09/16/20 1556 12/02/20 0837 05/30/21 1655 07/15/21 1951 07/16/21 0223  BUN '10 13 12 16 15 '$ 29* 20  CREATININE 0.68 0.75 0.75 0.78 0.73 1.19* 1.00  0.97   Essential hypertension -Continue Coreg.  Lisinopril on hold.  Liver cirrhosis -Noted in CT scan.  No ascites. -Follow-up as an outpatient with GI  Thrombocytopenia -Continue monitor Recent Labs  Lab 07/15/21 1951 07/16/21 0223  PLT 127* 97*  95*   History of migraine -on Topamax.  History of breast cancer in remission.  Prior history of pericarditis.  Mobility: Encourage ambulation Code Status:   Code Status: Full Code  Nutritional status: Body mass index is 29.29 kg/m.     Diet:  Diet Order             Diet heart healthy/carb modified Room service appropriate? Yes; Fluid consistency: Thin  Diet effective now                  DVT prophylaxis:  enoxaparin (LOVENOX) injection 40 mg Start: 07/16/21 1000   Antimicrobials: IV Rocephin Fluid: LR'@75'$  mill per hour Consultants: None Family Communication: None at bedside  Status is: Observation  Remains inpatient appropriate because: Needs IV antibiotics  Dispo: The patient is from: Home              Anticipated d/c is to: Home in 1 to 2 days  Patient currently is not medically stable to d/c.   Difficult to place patient No     Infusions:   cefTRIAXone (ROCEPHIN)  IV Stopped (07/16/21 1118)   lactated ringers 125 mL/hr at 07/16/21 0825    Scheduled Meds:  aspirin  325 mg Oral Daily   atorvastatin  40 mg Oral Daily   carvedilol  12.5 mg Oral BID WC   enoxaparin (LOVENOX) injection  40 mg Subcutaneous Daily   insulin aspart  0-9 Units Subcutaneous TID WC   insulin glargine-yfgn  30 Units Subcutaneous Daily   topiramate  50  mg Oral BID    Antimicrobials: Anti-infectives (From admission, onward)    Start     Dose/Rate Route Frequency Ordered Stop   07/16/21 1800  cefTRIAXone (ROCEPHIN) 1 g in sodium chloride 0.9 % 100 mL IVPB  Status:  Discontinued        1 g 200 mL/hr over 30 Minutes Intravenous Every 24 hours 07/15/21 2339 07/16/21 0118   07/16/21 1000  cefTRIAXone (ROCEPHIN) 2 g in sodium chloride 0.9 % 100 mL IVPB        2 g 200 mL/hr over 30 Minutes Intravenous Every 24 hours 07/16/21 0118     07/15/21 2315  cefTRIAXone (ROCEPHIN) 1 g in sodium chloride 0.9 % 100 mL IVPB        1 g 200 mL/hr over 30 Minutes Intravenous  Once 07/15/21 2315 07/16/21 0004       PRN meds: acetaminophen **OR** acetaminophen, hydrALAZINE, ondansetron (ZOFRAN) IV, oxyCODONE, traZODone   Objective: Vitals:   07/16/21 0945 07/16/21 1100  BP: 111/65 115/65  Pulse:  69  Resp:  (!) 24  Temp:    SpO2:  98%    Intake/Output Summary (Last 24 hours) at 07/16/2021 1147 Last data filed at 07/16/2021 0007 Gross per 24 hour  Intake 2000 ml  Output --  Net 2000 ml   Filed Weights   07/15/21 2107  Weight: 68 kg   Weight change:  Body mass index is 29.29 kg/m.   Physical Exam: General exam: Pleasant, elderly Hispanic female.  Having chills this morning Skin: No rashes, lesions or ulcers. HEENT: Atraumatic, normocephalic, no obvious bleeding Lungs: Clear to auscultate bilaterally CVS: Regular rate and rhythm, no murmur GI/Abd soft, nondistended, nontender anteriorly but has significant left flank tenderness CNS: Alert, awake abound x3 Psychiatry: Sad affect from pain Extremities: No pedal edema, no calf tenderness  Data Review: I have personally reviewed the laboratory data and studies available.  Recent Labs  Lab 07/15/21 1951 07/16/21 0223  WBC 13.8* 9.5  9.2  NEUTROABS 11.6*  --   HGB 13.4 11.8*  12.2  HCT 40.2 35.6*  36.0  MCV 91.2 92.0  91.8  PLT 127* 97*  95*   Recent Labs  Lab  07/15/21 1951 07/16/21 0223  NA 134* 139  K 4.4 3.8  CL 104 113*  CO2 20* 19*  GLUCOSE 304* 195*  BUN 29* 20  CREATININE 1.19* 1.00  0.97  CALCIUM 8.6* 7.7*    F/u labs ordered Unresulted Labs (From admission, onward)     Start     Ordered   07/23/21 0500  Creatinine, serum  (enoxaparin (LOVENOX)    CrCl >/= 30 ml/min)  Weekly,   R     Comments: while on enoxaparin therapy    07/16/21 0118   07/17/21 0500  CBC with Differential/Platelet  Daily,   R      07/16/21 1147   07/17/21 0500  Basic metabolic panel  Daily,   R      07/16/21 1147   07/16/21 0117  HIV Antibody (routine testing w rflx)  (HIV Antibody (Routine testing w reflex) panel)  Once,   STAT        07/16/21 0118   07/15/21 2342  Culture, blood (routine x 2)  BLOOD CULTURE X 2,   R      07/15/21 2341   07/15/21 1617  Urine Culture  Once,   STAT       Question:  Indication  Answer:  Dysuria   07/15/21 1617            Signed, Terrilee Croak, MD [facility] 07/16/2021

## 2021-07-16 NOTE — ED Notes (Signed)
Breakfast Ordered 

## 2021-07-16 NOTE — H&P (Addendum)
History and Physical    Brandi Bryant [id number] DOB: January 08, 1956 DOA: 07/15/2021  PCP: Charlott Rakes, MD  Patient coming from: Home.  Patent attorney used.  Chief Complaint: Left flank pain nausea vomiting.  HPI: Brandi Bryant is a 65 y.o. female with history of diabetes mellitus type 2, hypertension, migraine headaches prior history of pericarditis and history of breast cancer in remission presents to the ER with complaints of left flank pain with nausea vomiting fever chills for the last 3 days.  Also has been having dysuria.  ED Course: In the ER patient's labs show creatinine 1.1 increased from 0.7 about a month ago blood glucose was 304 over has not taken her Lantus this morning anion gap of 10.  WBC count is 13.8 lactic acid was normal.  Platelets are 127.  CT abdomen pelvis done shows features concerning for left-sided fullness in the renal system with perinephric stranding concerning for pyelonephritis or could have just recently passed a stone.  UA shows features concerning for UTI.  Patient's temperature is around 101.6 in the ER.  Patient had blood cultures urine cultures ordered and started on ceftriaxone admitted for pyelonephritis with intractable nausea vomiting.  COVID test is negative.  Review of Systems: As per HPI, rest all negative.   Past Medical History:  Diagnosis Date   Abdominal pain    Allergy    Arthralgia 08/13/2013   Bell palsy 2006   Bell's palsy    Breast cancer ([place]) 2009   Left Breast Cancer   Cataract    Chest pain 09/30/2013   Cholelithiasis    Chronic sinusitis 05/16/2017   Colon cancer screening [id number]   Complication of anesthesia    " tubal ligation, in [place]"  had weakness, couldnt stand up the next day, was given pills for oxygen for Brain."  1 month after I was having loss of attentiviness, still have to focus  carefully.    Coronary artery disease    CVA (cerebral vascular accident) ([place]) 03/30/2018    Depression    Diabetes mellitus    Type II   Dyspnea    at times- when air conditioner is runnimg, heat also    Encounter for screening colonoscopy 10/30/2015   GERD (gastroesophageal reflux disease)    Hepatic steatosis    History of breast cancer 06/01/2012   HTN (hypertension)    Hx of adenomatous polyp of colon 11/07/2019   Hyperlipidemia    Internal carotid artery stenosis 04/03/2018   Personal history of chemotherapy 2009   Left Breast Cancer   Personal history of radiation therapy 2009   Left Breast Cancer   Stroke [facility])    2006, 2015   TIA (transient ischemic attack) 02/20/2014    Past Surgical History:  Procedure Laterality Date   ANKLE ARTHROSCOPY Right 12/14/2016   Procedure: Right Ankle Arthroscopic Debridement;  Surgeon: Newt Minion, MD;  Location: [place];  Service: Orthopedics;  Laterality: Right;   Arm surgery     Left tendon lengthen   BREAST LUMPECTOMY Left 2009   CHOLECYSTECTOMY N/A 12/09/2015   Procedure: LAPAROSCOPIC CHOLECYSTECTOMY WITH INTRAOPERATIVE CHOLANGIOGRAM;  Surgeon: Greer Pickerel, MD;  Location: [place];  Service: General;  Laterality: N/A;   LEFT HEART CATHETERIZATION WITH CORONARY ANGIOGRAM N/A 10/30/2013   Procedure: LEFT HEART CATHETERIZATION WITH CORONARY ANGIOGRAM;  Surgeon: Josue Hector, MD;  Location[facility] C[facility] LAB;  Service: Cardiovascular;  Laterality: N/A;   porta cath Right    Removal of Porta cath Right  TUBAL LIGATION       reports that she has never smoked. She has never used smokeless tobacco. She reports that she does not drink alcohol and does not use drugs.  Allergies  Allergen Reactions   Gadolinium Derivatives Nausea And Vomiting    Code: VOM, Desc: Pt began vomiting 45 sec after MRI contrast injection of Multihance, Onset Date: [id number]     Iodinated Diagnostic Agents Nausea And Vomiting    Family History  Problem Relation Age of Onset   Thyroid cancer Sister    Hypertension Mother    Diabetes Mother    Migraines  Daughter    Colon cancer Neg Hx    Esophageal cancer Neg Hx    Stomach cancer Neg Hx    Rectal cancer Neg Hx     Prior to Admission medications   Medication Sig Start Date End Date Taking? Authorizing Provider  aspirin 325 MG EC tablet Take 1 tablet (325 mg total) by mouth daily. 06/14/18  Yes McClung, Angela M, PA-C  atorvastatin (LIPITOR) 40 MG tablet TAKE 1 TABLET (40 MG TOTAL) BY MOUTH DAILY. 05/13/21 05/13/22 Yes Mayers, Cari S, PA-C  carvedilol (COREG) 12.5 MG tablet TAKE 1 TABLET (12.5 MG TOTAL) BY MOUTH 2 (TWO) TIMES DAILY WITH A MEAL. 05/13/21 05/13/22 Yes Mayers, Cari S, PA-C  glipiZIDE (GLUCOTROL) 10 MG tablet TAKE 1 TABLET (10 MG TOTAL) BY MOUTH 2 (TWO) TIMES DAILY BEFORE A MEAL. 07/06/21 07/06/22 Yes Dorna Mai, MD  insulin glargine (LANTUS SOLOSTAR) 100 UNIT/ML Solostar Pen Inject 32 Units into the skin 2 (two) times daily. Patient taking differently: Inject 32 Units into the skin daily. 05/13/21  Yes Mayers, Cari S, PA-C  lisinopril (ZESTRIL) 10 MG tablet TAKE 1 TABLET (10 MG TOTAL) BY MOUTH DAILY. Patient taking differently: Take 10 mg by mouth daily. 05/13/21 05/13/22 Yes Mayers, Cari S, PA-C  rizatriptan (MAXALT) 5 MG tablet TAKE 1 TABLET (5 MG TOTAL) BY MOUTH AS NEEDED FOR MIGRAINE. MAY REPEAT IN 2 HOURS IF NEEDED 05/13/21 05/13/22 Yes Mayers, Cari S, PA-C  topiramate (TOPAMAX) 50 MG tablet TAKE 1 TABLET (50 MG TOTAL) BY MOUTH 2 (TWO) TIMES DAILY. 05/13/21 05/13/22 Yes Mayers, Cari S, PA-C  traZODone (DESYREL) 100 MG tablet TAKE 1 TABLET (100 MG TOTAL) BY MOUTH AT BEDTIME AS NEEDED FOR SLEEP. 05/13/21 05/13/22 Yes Mayers, Cari S, PA-C  Insulin Pen Needle (TRUEPLUS PEN NEEDLES) 32G X 4 MM MISC USE TO INJECT LANTUS DAILY 03/08/21   Charlott Rakes, MD  escitalopram (LEXAPRO) 10 MG tablet Take 10 mg by mouth daily.  01/31/12  [provider]    Physical Exam: Constitutional: Moderately built and nourished. Vitals:   07/15/21 2230 07/15/21 2312 07/16/21 0005 07/16/21 0100  BP:  (!) 109/47 (!) 144/63 (!) 133/56 (!) 132/57  Pulse: 79 91 92 86  Resp: (!) '28 20 19 20  '$ Temp:  (!) 101.4 F (38.6 C)  (!) 101.6 F (38.7 C)  TempSrc:  Oral  Oral  SpO2: 97% 100% 95% 98%  Weight:      Height:       Eyes: Anicteric no pallor. ENMT: No discharge from the ears eyes nose and mouth. Neck: No mass felt.  No neck rigidity. Respiratory: No rhonchi or crepitations. Cardiovascular: S1-S2 heard. Abdomen: Left flank tenderness.  No guarding or rigidity. Musculoskeletal: No edema. Skin: No rash. Neurologic: Alert awake oriented to time place and person.  Moves all extremities. Psychiatric: Appears normal.  Normal affect.   Labs on Admission: I  have personally reviewed following labs and imaging studies  CBC: Recent Labs  Lab 07/15/21 1951  WBC 13.8*  NEUTROABS 11.6*  HGB 13.4  HCT 40.2  MCV 91.2  PLT [id number]   Basic Metabolic Panel: Recent Labs  Lab 07/15/21 1951  NA 134*  K 4.4  CL 104  CO2 20*  GLUCOSE 304*  BUN 29*  CREATININE 1.19*  CALCIUM 8.6*   GFR: Estimated Creatinine Clearance: 41.1 mL/min (A) (by C-G formula based on SCr of 1.19 mg/dL (H)). Liver Function Tests: Recent Labs  Lab 07/15/21 1951  AST 28  ALT 30  ALKPHOS 75  BILITOT 1.8*  PROT 6.4*  ALBUMIN 3.1*   No results for input(s): LIPASE, AMYLASE in the last 168 hours. No results for input(s): AMMONIA in the last 168 hours. Coagulation Profile: No results for input(s): INR, PROTIME in the last 168 hours. Cardiac Enzymes: No results for input(s): CKTOTAL, CKMB, CKMBINDEX, TROPONINI in the last 168 hours. BNP (last 3 results) No results for input(s): PROBNP in the last 8760 hours. HbA1C: No results for input(s): HGBA1C in the last 72 hours. CBG: No results for input(s): GLUCAP in the last 168 hours. Lipid Profile: No results for input(s): CHOL, HDL, LDLCALC, TRIG, CHOLHDL, LDLDIRECT in the last 72 hours. Thyroid Function Tests: No results for input(s): TSH, T4TOTAL, FREET4,  T3FREE, THYROIDAB in the last 72 hours. Anemia Panel: No results for input(s): VITAMINB12, FOLATE, FERRITIN, TIBC, IRON, RETICCTPCT in the last 72 hours. Urine analysis:    Component Value Date/Time   COLORURINE YELLOW 07/15/2021 1810   APPEARANCEUR HAZY (A) 07/15/2021 1810   LABSPEC 1.013 07/15/2021 1810   LABSPEC 1.015 10/27/2016 1106   PHURINE 5.0 07/15/2021 1810   GLUCOSEU 150 (A) 07/15/2021 1810   GLUCOSEU 500 10/27/2016 1106   HGBUR NEGATIVE 07/15/2021 1810   BILIRUBINUR NEGATIVE 07/15/2021 1810   BILIRUBINUR Negative 10/27/2016 1106   [place] 07/15/2021 1810   PROTEINUR NEGATIVE 07/15/2021 1810   UROBILINOGEN 0.2 01/08/2018 1602   UROBILINOGEN 0.2 10/27/2016 1106   NITRITE NEGATIVE 07/15/2021 1810   LEUKOCYTESUR LARGE (A) 07/15/2021 1810   LEUKOCYTESUR Small 10/27/2016 1106   Sepsis Labs: '@LABRCNTIP'$ (procalcitonin:4,lacticidven:4) ) Recent Results (from the past 240 hour(s))  Resp Panel by RT-PCR (Flu A&B, Covid) Nasopharyngeal Swab     Status: None   Collection Time: 07/15/21  7:01 PM   Specimen: Nasopharyngeal Swab; Nasopharyngeal(NP) swabs in vial transport medium  Result Value Ref Range Status   SARS Coronavirus 2 by RT PCR NEGATIVE NEGATIVE Final    Comment: (NOTE) SARS-CoV-2 target nucleic acids are NOT DETECTED.  The SARS-CoV-2 RNA is generally detectable in upper respiratory specimens during the acute phase of infection. The lowest concentration of SARS-CoV-2 viral copies this assay can detect is 138 copies/mL. A negative result does not preclude SARS-Cov-2 infection and should not be used as the sole basis for treatment or other patient management decisions. A negative result may occur with  improper specimen collection/handling, submission of specimen other than nasopharyngeal swab, presence of viral mutation(s) within the areas targeted by this assay, and inadequate number of viral copies(<138 copies/mL). A negative result must be combined  with clinical observations, patient history, and epidemiological information. The expected result is Negative.  Fact Sheet for Patients:  EntrepreneurPulse.com.au  Fact Sheet for Healthcare Providers:  IncredibleEmployment.be  This test is no t yet approved or cleared by the [place] FDA and  has been authorized for detection and/or diagnosis of SARS-CoV-2 by FDA under an Emergency  Use Authorization (EUA). This EUA will remain  in effect (meaning this test can be used) for the duration of the COVID-19 declaration under Section 564(b)(1) of the Act, 21 U.S.C.section 360bbb-3(b)(1), unless the authorization is terminated  or revoked sooner.       Influenza A by PCR NEGATIVE NEGATIVE Final   Influenza B by PCR NEGATIVE NEGATIVE Final    Comment: (NOTE) The Xpert Xpress SARS-CoV-2/FLU/RSV plus assay is intended as an aid in the diagnosis of influenza from Nasopharyngeal swab specimens and should not be used as a sole basis for treatment. Nasal washings and aspirates are unacceptable for Xpert Xpress SARS-CoV-2/FLU/RSV testing.  Fact Sheet for Patients: EntrepreneurPulse.com.au  Fact Sheet for Healthcare Providers: IncredibleEmployment.be  This test is not yet approved or cleared by the [place] FDA and has been authorized for detection and/or diagnosis of SARS-CoV-2 by FDA under an Emergency Use Authorization (EUA). This EUA will remain in effect (meaning this test can be used) for the duration of the COVID-19 declaration under Section 564(b)(1) of the Act, 21 U.S.C. section 360bbb-3(b)(1), unless the authorization is terminated or revoked.  Performed at [facility], [place] [address], [place], [place] [address]      Radiological Exams on Admission: CT Renal Stone Study  Result Date: 07/15/2021 CLINICAL DATA:  Flank pain and dysuria EXAM: CT ABDOMEN AND PELVIS WITHOUT CONTRAST TECHNIQUE:  Multidetector CT imaging of the abdomen and pelvis was performed following the standard protocol without IV contrast. COMPARISON:  09/09/2020 FINDINGS: Lower chest: No acute abnormality. Hepatobiliary: Liver is somewhat nodular in appearance consistent with underlying cirrhotic change. The caudate lobe and left lobe are enlarged in a compensatory fashion. Gallbladder has been surgically removed. Pancreas: Unremarkable. No pancreatic ductal dilatation or surrounding inflammatory changes. Spleen: Normal in size without focal abnormality. Adrenals/Urinary Tract: Adrenal glands are within normal limits. No renal calculi are seen. Mild perinephric stranding is noted in the left kidney new from the prior exam. No ureteral stone is noted although these changes could be related to edema from recently passed stone. The bladder is partially distended. Stomach/Bowel: Scattered diverticular change in the descending colon is noted. No findings to suggest diverticulitis are seen. The appendix is within normal limits. Small bowel and stomach are unremarkable. Vascular/Lymphatic: Aortic atherosclerosis. No enlarged abdominal or pelvic lymph nodes. Reproductive: Uterus and bilateral adnexa are well visualized. Scattered calcifications are noted within the uterus which may be related to uterine fibroid change. Other: No abdominal wall hernia or abnormality. No abdominopelvic ascites. Musculoskeletal: No acute or significant osseous findings. IMPRESSION: Minimal fullness of the left renal collecting system with some perinephric stranding. These changes may be related to edema from a recently passed stone although underlying UTI could present in a similar fashion. Diverticulosis without diverticulitis. Findings suggestive of uterine fibroid change. Hepatic cirrhosis without complicating factors. Electronically Signed   By: Inez Catalina M.D.   On: 07/15/2021 20:20      Assessment/Plan Principal Problem:   Pyelonephritis Active  Problems:   HTN (hypertension)   CAD (coronary artery disease), native coronary artery   Type 2 diabetes mellitus with diabetic neuropathy (HCC)   AKI (acute kidney injury) ([place])    Pyelonephritis -patient symptoms are consistent with the left sided pyelonephritis.  Since patient has intractable nausea vomiting and will be unable to keep medications in.  We will keep patient on IV antibiotics fluids follow cultures. Diabetes mellitus type 2 uncontrolled has not taken her Lantus this morning which we will dose now  with sliding scale coverage.  Last hemoglobin A1c about 7 months ago was 7.5. Acute renal failure likely from vomiting.  Creatinine has worsened 0.7 about a month ago and it is around 1.1 now.  We will hold lisinopril continue hydration.  Follow metabolic panel. Hypertension we will continue beta-blockers and amlodipine.  Holding lisinopril due to renal failure.  As needed IV hydralazine. History of breast cancer in remission. Prior history of pericarditis. History of migraine on Topamax. Cirrhosis of the liver seen in the CAT scan will need outpatient follow-up. Thrombocytopenia could be due to infectious process or cirrhosis.  Follow CBC.   DVT prophylaxis: Lovenox. Code Status: Full code. Family Communication: Discussed with patient. Disposition Plan: Home. Consults called: None. Admission status: Observation.   Rise Patience MD [facility] Pager [phone].  If 7PM-7AM, please contact night-coverage [email or web address] Password [email or web address]  07/16/2021, 1:19 AM

## 2021-07-17 LAB — CBC WITH DIFFERENTIAL/PLATELET
Abs Immature Granulocytes: 0.05 10*3/uL (ref 0.00–0.07)
Basophils Absolute: 0 10*3/uL (ref 0.0–0.1)
Basophils Relative: 0 %
Eosinophils Absolute: 0 10*3/uL (ref 0.0–0.5)
Eosinophils Relative: 0 %
HCT: 34.4 % — ABNORMAL LOW (ref 36.0–46.0)
Hemoglobin: 11.7 g/dL — ABNORMAL LOW (ref 12.0–15.0)
Immature Granulocytes: 1 %
Lymphocytes Relative: 10 %
Lymphs Abs: 0.7 10*3/uL (ref 0.7–4.0)
MCH: 30.5 pg (ref 26.0–34.0)
MCHC: 34 g/dL (ref 30.0–36.0)
MCV: 89.8 fL (ref 80.0–100.0)
Monocytes Absolute: 0.5 10*3/uL (ref 0.1–1.0)
Monocytes Relative: 7 %
Neutro Abs: 5.8 10*3/uL (ref 1.7–7.7)
Neutrophils Relative %: 82 %
Platelets: 101 10*3/uL — ABNORMAL LOW (ref 150–400)
RBC: 3.83 MIL/uL — ABNORMAL LOW (ref 3.87–5.11)
RDW: 12.9 % (ref 11.5–15.5)
WBC: 7.1 10*3/uL (ref 4.0–10.5)
nRBC: 0 % (ref 0.0–0.2)

## 2021-07-17 LAB — GLUCOSE, CAPILLARY
Glucose-Capillary: 156 mg/dL — ABNORMAL HIGH (ref 70–99)
Glucose-Capillary: 241 mg/dL — ABNORMAL HIGH (ref 70–99)
Glucose-Capillary: 150 mg/dL — ABNORMAL HIGH (ref 70–99)
Glucose-Capillary: 89 mg/dL (ref 70–99)

## 2021-07-17 LAB — BASIC METABOLIC PANEL
Anion gap: 5 (ref 5–15)
BUN: 15 mg/dL (ref 8–23)
CO2: 20 mmol/L — ABNORMAL LOW (ref 22–32)
Calcium: 8.3 mg/dL — ABNORMAL LOW (ref 8.9–10.3)
Chloride: 109 mmol/L (ref 98–111)
Creatinine, Ser: 0.93 mg/dL (ref 0.44–1.00)
GFR, Estimated: >60 mL/min (ref >60)
Glucose, Bld: 215 mg/dL — ABNORMAL HIGH (ref 70–99)
Potassium: 4.4 mmol/L (ref 3.5–5.1)
Sodium: 134 mmol/L — ABNORMAL LOW (ref 135–145)

## 2021-07-17 MED ORDER — SODIUM CHLORIDE 0.9 % IV SOLN: INTRAVENOUS | Status: DC

## 2021-07-17 MED ORDER — GLIPIZIDE 5 MG PO TABS
2.5000 mg | ORAL_TABLET | Freq: Two times a day (BID) | ORAL | Status: DC
  Administered 2021-07-17 (×2): 2.5 mg via ORAL
  Filled 2021-07-17 (×2): qty 1

## 2021-07-17 NOTE — Progress Notes (Signed)
PROGRESS NOTE  Brandi Bryant  DOB: 1956/02/12  PCP: Charlott Rakes, MD [id number]  DOA: 07/15/2021  LOS: 1 day  Hospital Day: 3   Chief Complaint  Patient presents with   Headache   Back Pain    Brief narrative: Brandi Bryant is a 65 y.o. female with PMH significant for DM2, HTN, HLD, CVA, migraine, history of pericarditis, breast cancer in remission. Patient presented to the ED on 8/25 with complaints of left flank pain with nausea, vomiting, fever, chills and dysuria for 3 days.    In the ED, she was noted to have a temperature one 1.6, heart rate in 90s, respiratory rate up to 24, blood pressure stable. Labs with WBC count elevated to 13.8, lactic acid normal at 1.4, glucose level elevated to 304, BUN/creatinine 29/1.19. Urinalysis with hazy urine with large amount leukocytes CT abdomen showed some fullness of the left renal collecting system with perinephric stranding, which could have been secondary to acute viral nephritis or edema from recently passed stone. Patient was started on IV Rocephin and admitted to hospitalist service  Subjective: Patient was seen and examined this morning. Pleasant elderly Hispanic female.  Lying down in bed.  Continues to have pain in the left flank although improving.  No family at bedside.  Intermittent spikes of fever, overall lower spikes.  Assessment/Plan: Sepsis secondary to acute left pyelonephritis -Presented with fever, dysuria, nausea, vomiting, left-sided pain. -Urinalysis showed hazy urine with large amount leukocytes.  Blood culture and urine culture sent. -Currently on IV Rocephin. -As needed pain meds, antiemetics -WBC count normalized.  Fever spikes trending down.  Lactate is normal. Recent Labs  Lab 07/15/21 1951 07/16/21 0001 07/16/21 0223 07/17/21 0228  WBC 13.8*  --  9.5  9.2 7.1  LATICACIDVEN  --  1.4  --   --    Type 2 diabetes mellitus Hyperglycemia -A1c 6.7 in June 2022 -Home meds  include Lantus 32 units daily and glipizide 10 mg twice daily.  Patient reports that she had not taken her insulin on the day of admission and hence was running hyperglycemic. -Currently on Lantus 30 units daily.  Glipizide on hold.  Blood sugar level 156 this morning.  We will resume glipizide at lower dose this morning. -Continue sliding scale insulin with Accu-Cheks. Recent Labs  Lab 07/16/21 0146 07/16/21 0820 07/16/21 1202 07/17/21 0755  GLUCAP 191* 225* 126* 156*   AKI -Creatinine elevated by more than 0.3 points from baseline.  Improving with IV hydration.  Continue LR at 75 mill per hour. Recent Labs    09/08/20 1526 09/14/20 2047 09/16/20 1556 12/02/20 0837 05/30/21 1655 07/15/21 1951 07/16/21 0223 07/17/21 0228  BUN '10 13 12 16 15 '$ 29* 20 15  CREATININE 0.68 0.75 0.75 0.78 0.73 1.19* 1.00  0.97 0.93   Essential hypertension -Continue Coreg.  Lisinopril on hold.  Liver cirrhosis -Noted in CT scan.  No ascites. -Talked to patient's son Mr. Rosamaria Lints this morning.  He was not aware of her diagnosis.  Ambulatory referral to GI ordered.  CVA/HLD -On aspirin and statin.  I would stop statin because of liver cirrhosis..  Continue aspirin.  Thrombocytopenia -Continue monitor Recent Labs  Lab 07/15/21 1951 07/16/21 0223 07/17/21 0228  PLT 127* 97*  95* 101*   History of migraine -on Topamax.  History of breast cancer in remission.  Prior history of pericarditis -Currently has no chest pain.  Mobility: Encourage ambulation Code Status:   Code Status: Full Code  Nutritional status: Body mass  index is 30.48 kg/m.     Diet:  Diet Order             Diet heart healthy/carb modified Room service appropriate? Yes; Fluid consistency: Thin  Diet effective now                  DVT prophylaxis:  enoxaparin (LOVENOX) injection 40 mg Start: 07/16/21 1000   Antimicrobials: IV Rocephin Fluid: LR'@75'$  mill per hour Consultants: None Family  Communication:Talked to patient's son Mr. Rosamaria Lints this morning.  Status is: Observation  Remains inpatient appropriate because: Continue IV antibiotics  Dispo: The patient is from: Home              Anticipated d/c is to: Home in 1 to 2 days              Patient currently is not medically stable to d/c.   Difficult to place patient No     Infusions:   sodium chloride 75 mL/hr at 07/17/21 0849   cefTRIAXone (ROCEPHIN)  IV 2 g (07/17/21 0849)    Scheduled Meds:  aspirin  325 mg Oral Daily   carvedilol  12.5 mg Oral BID WC   enoxaparin (LOVENOX) injection  40 mg Subcutaneous Daily   glipiZIDE  2.5 mg Oral BID   insulin aspart  0-9 Units Subcutaneous TID WC   insulin glargine-yfgn  30 Units Subcutaneous Daily   topiramate  50 mg Oral BID    Antimicrobials: Anti-infectives (From admission, onward)    Start     Dose/Rate Route Frequency Ordered Stop   07/16/21 1800  cefTRIAXone (ROCEPHIN) 1 g in sodium chloride 0.9 % 100 mL IVPB  Status:  Discontinued        1 g 200 mL/hr over 30 Minutes Intravenous Every 24 hours 07/15/21 2339 07/16/21 0118   07/16/21 1000  cefTRIAXone (ROCEPHIN) 2 g in sodium chloride 0.9 % 100 mL IVPB        2 g 200 mL/hr over 30 Minutes Intravenous Every 24 hours 07/16/21 0118     07/15/21 2315  cefTRIAXone (ROCEPHIN) 1 g in sodium chloride 0.9 % 100 mL IVPB        1 g 200 mL/hr over 30 Minutes Intravenous  Once 07/15/21 2315 07/16/21 0004       PRN meds: acetaminophen **OR** acetaminophen, hydrALAZINE, ondansetron (ZOFRAN) IV, oxyCODONE, traZODone   Objective: Vitals:   07/16/21 1959 07/17/21 0535  BP: 133/63 140/63  Pulse: 77 86  Resp: 18 18  Temp: (!) 100.4 F (38 C) (!) 101 F (38.3 C)  SpO2: 97% 96%    Intake/Output Summary (Last 24 hours) at 07/17/2021 1106 Last data filed at 07/17/2021 0300 Gross per 24 hour  Intake 1996.12 ml  Output --  Net 1996.12 ml   Filed Weights   07/15/21 2107 07/16/21 1959  Weight: 68 kg 70.8 kg    Weight change: 2.76 kg Body mass index is 30.48 kg/m.   Physical Exam: General exam: Pleasant, elderly Hispanic female.  Mild distress because of left flank pain Skin: No rashes, lesions or ulcers. HEENT: Atraumatic, normocephalic, no obvious bleeding Lungs: Clear to auscultate bilaterally CVS: Regular rate and rhythm, no murmur GI/Abd soft, nondistended, nontender anteriorly.  Left flank pain continues but less tender than yesterday CNS: Alert, awake, oriented x3 Psychiatry: Sad affect from pain Extremities: No pedal edema, no calf tenderness  Data Review: I have personally reviewed the laboratory data and studies available.  Recent Labs  Lab 07/15/21 1951  07/16/21 0223 07/17/21 0228  WBC 13.8* 9.5  9.2 7.1  NEUTROABS 11.6*  --  5.8  HGB 13.4 11.8*  12.2 11.7*  HCT 40.2 35.6*  36.0 34.4*  MCV 91.2 92.0  91.8 89.8  PLT 127* 97*  95* 101*   Recent Labs  Lab 07/15/21 1951 07/16/21 0223 07/17/21 0228  NA 134* 139 134*  K 4.4 3.8 4.4  CL 104 113* 109  CO2 20* 19* 20*  GLUCOSE 304* 195* 215*  BUN 29* 20 15  CREATININE 1.19* 1.00  0.97 0.93  CALCIUM 8.6* 7.7* 8.3*    F/u labs ordered Unresulted Labs (From admission, onward)     Start     Ordered   07/23/21 0500  Creatinine, serum  (enoxaparin (LOVENOX)    CrCl >/= 30 ml/min)  Weekly,   R     Comments: while on enoxaparin therapy    07/16/21 0118   07/17/21 0500  CBC with Differential/Platelet  Daily,   R      07/16/21 1147   07/17/21 [id number]  Basic metabolic panel  Daily,   R      07/16/21 1147   07/15/21 2342  Culture, blood (routine x 2)  BLOOD CULTURE X 2,   R      07/15/21 2341   07/15/21 1617  Urine Culture  Once,   STAT       Question:  Indication  Answer:  Dysuria   07/15/21 1617            Signed, Terrilee Croak, MD [facility] 07/17/2021

## 2021-07-18 LAB — CBC WITH DIFFERENTIAL/PLATELET
Abs Immature Granulocytes: 0.03 10*3/uL (ref 0.00–0.07)
Basophils Absolute: 0 10*3/uL (ref 0.0–0.1)
Basophils Relative: 0 %
Eosinophils Absolute: 0.1 10*3/uL (ref 0.0–0.5)
Eosinophils Relative: 2 %
HCT: 35.1 % — ABNORMAL LOW (ref 36.0–46.0)
Hemoglobin: 12.1 g/dL (ref 12.0–15.0)
Immature Granulocytes: 1 %
Lymphocytes Relative: 21 %
Lymphs Abs: 1 10*3/uL (ref 0.7–4.0)
MCH: 30.6 pg (ref 26.0–34.0)
MCHC: 34.5 g/dL (ref 30.0–36.0)
MCV: 88.6 fL (ref 80.0–100.0)
Monocytes Absolute: 0.5 10*3/uL (ref 0.1–1.0)
Monocytes Relative: 12 %
Neutro Abs: 3 10*3/uL (ref 1.7–7.7)
Neutrophils Relative %: 64 %
Platelets: 107 10*3/uL — ABNORMAL LOW (ref 150–400)
RBC: 3.96 MIL/uL (ref 3.87–5.11)
RDW: 13 % (ref 11.5–15.5)
WBC: 4.6 10*3/uL (ref 4.0–10.5)
nRBC: 0 % (ref 0.0–0.2)

## 2021-07-18 LAB — GLUCOSE, CAPILLARY
Glucose-Capillary: 76 mg/dL (ref 70–99)
Glucose-Capillary: 84 mg/dL (ref 70–99)
Glucose-Capillary: 101 mg/dL — ABNORMAL HIGH (ref 70–99)
Glucose-Capillary: 150 mg/dL — ABNORMAL HIGH (ref 70–99)

## 2021-07-18 LAB — BASIC METABOLIC PANEL
Anion gap: 6 (ref 5–15)
BUN: 11 mg/dL (ref 8–23)
CO2: 20 mmol/L — ABNORMAL LOW (ref 22–32)
Calcium: 8.7 mg/dL — ABNORMAL LOW (ref 8.9–10.3)
Chloride: 112 mmol/L — ABNORMAL HIGH (ref 98–111)
Creatinine, Ser: 0.92 mg/dL (ref 0.44–1.00)
GFR, Estimated: >60 mL/min (ref >60)
Glucose, Bld: 62 mg/dL — ABNORMAL LOW (ref 70–99)
Potassium: 4.3 mmol/L (ref 3.5–5.1)
Sodium: 138 mmol/L (ref 135–145)

## 2021-07-18 MED ORDER — ROSUVASTATIN CALCIUM 5 MG PO TABS
5.0000 mg | ORAL_TABLET | Freq: Every day | ORAL | Status: DC
  Administered 2021-07-18 – 2021-07-19 (×2): 5 mg via ORAL
  Filled 2021-07-18 (×2): qty 1

## 2021-07-18 NOTE — Progress Notes (Signed)
PROGRESS NOTE  Brandi Bryant  DOB: 12-20-1955  PCP: Charlott Rakes, MD [id number]  DOA: 07/15/2021  LOS: 2 days  Hospital Day: 4   Chief Complaint  Patient presents with   Headache   Back Pain    Brief narrative: Brandi Bryant is a 65 y.o. female with PMH significant for DM2, HTN, HLD, CVA, migraine, history of pericarditis, breast cancer in remission. Patient presented to the ED on 8/25 with complaints of left flank pain with nausea, vomiting, fever, chills and dysuria for 3 days.    In the ED, she was noted to have a temperature one 1.6, heart rate in 90s, respiratory rate up to 24, blood pressure stable. Labs with WBC count elevated to 13.8, lactic acid normal at 1.4, glucose level elevated to 304, BUN/creatinine 29/1.19. Urinalysis with hazy urine with large amount leukocytes CT abdomen showed some fullness of the left renal collecting system with perinephric stranding, which could have been secondary to acute viral nephritis or edema from recently passed stone. Patient was started on IV Rocephin and admitted to hospitalist service  Subjective: Patient was seen and examined this morning. Lying on bed.  Not in distress.  No fever last 24 hours.  Feels better.  Pain much better.  No family at bedside.  Per RN, patient has been up and walking inside her room.   Assessment/Plan: Sepsis secondary to acute left pyelonephritis -Presented with fever, dysuria, nausea, vomiting, left-sided pain. -Urinalysis showed hazy urine with large amount leukocytes.  Blood culture and urine culture sent. -Clinically improving on IV Rocephin. -WBC count normalized.  Fever spikes trending down.  Lactate is normal. -No growth in cultures so far.  Tentative plan to discharge home tomorrow on oral antibiotics. Recent Labs  Lab 07/15/21 1951 07/16/21 0001 07/16/21 0223 07/17/21 0228 07/18/21 0343  WBC 13.8*  --  9.5  9.2 7.1 4.6  LATICACIDVEN  --  1.4  --   --   --    Type 2 diabetes mellitus Hyperglycemia/hypoglycemia -A1c 6.7 in June 2022 -Home meds include Lantus 32 units daily and glipizide 10 mg twice daily.  Patient reports that she had not taken her insulin on the day of admission and hence was running hyperglycemic at presentation. -Currently on Lantus 30 units daily.  Glipizide was resumed yesterday at 2.5 mg twice daily.  However this morning her blood sugar level is low in 60s.  Asymptomatic.  We will stop glipizide.  We will continue Lantus at current dose.   -Continue sliding scale insulin with Accu-Cheks. Recent Labs  Lab 07/17/21 0755 07/17/21 1254 07/17/21 1529 07/17/21 2101 07/18/21 0745  GLUCAP 156* 241* 150* 89 76   AKI -Creatinine elevated by more than 0.3 points from baseline.  Improved with IV fluid.  Can stop IV fluid today.   Recent Labs    09/08/20 1526 09/14/20 2047 09/16/20 1556 12/02/20 0837 05/30/21 1655 07/15/21 1951 07/16/21 0223 07/17/21 0228 07/18/21 0343  BUN '10 13 12 16 15 '$ 29* '20 15 11  '$ CREATININE 0.68 0.75 0.75 0.78 0.73 1.19* 1.00  0.97 0.93 0.92   Essential hypertension -Continue Coreg.  Lisinopril on hold.  Resume at discharge.  Liver cirrhosis -Noted in CT scan.  No ascites. -Talked to patient's son Mr. Rosamaria Lints on 8/27.  He was not aware of her diagnosis.  Ambulatory referral to GI ordered.  CVA/HLD -On aspirin and statin.  Continue aspirin. Dissussed with pharmacist about statin use and liver cirrhosis patient.  Since her liver cirrhosis remains compensated at  this time, continue statin.  I would switch her from Lipitor to Crestor for better tolerance.  Thrombocytopenia -Platelet count stable.  Continue to monitor. Recent Labs  Lab 07/15/21 1951 07/16/21 0223 07/17/21 0228 07/18/21 0343  PLT 127* 97*  95* 101* 107*   History of migraine -on Topamax.  History of breast cancer in remission.  Prior history of pericarditis -Currently has no chest pain.  Mobility: Encourage  ambulation Code Status:   Code Status: Full Code  Nutritional status: Body mass index is 30.48 kg/m.     Diet:  Diet Order             Diet heart healthy/carb modified Room service appropriate? Yes; Fluid consistency: Thin  Diet effective now                  DVT prophylaxis:  enoxaparin (LOVENOX) injection 40 mg Start: 07/16/21 1000   Antimicrobials: IV Rocephin Fluid: Can stop IV fluid today. Consultants: None Family Communication:Talked to patient's son Mr. Rosamaria Lints yesterday 8/27.  Status is: Inpatient  Remains inpatient appropriate because: Continue IV antibiotics  Dispo: The patient is from: Home              Anticipated d/c is to: Home likely tomorrow              Patient currently is not medically stable to d/c.   Difficult to place patient No     Infusions:   cefTRIAXone (ROCEPHIN)  IV 2 g (07/18/21 0843)    Scheduled Meds:  aspirin  325 mg Oral Daily   carvedilol  12.5 mg Oral BID WC   enoxaparin (LOVENOX) injection  40 mg Subcutaneous Daily   insulin aspart  0-9 Units Subcutaneous TID WC   insulin glargine-yfgn  30 Units Subcutaneous Daily   rosuvastatin  5 mg Oral Daily   topiramate  50 mg Oral BID    Antimicrobials: Anti-infectives (From admission, onward)    Start     Dose/Rate Route Frequency Ordered Stop   07/16/21 1800  cefTRIAXone (ROCEPHIN) 1 g in sodium chloride 0.9 % 100 mL IVPB  Status:  Discontinued        1 g 200 mL/hr over 30 Minutes Intravenous Every 24 hours 07/15/21 2339 07/16/21 0118   07/16/21 1000  cefTRIAXone (ROCEPHIN) 2 g in sodium chloride 0.9 % 100 mL IVPB        2 g 200 mL/hr over 30 Minutes Intravenous Every 24 hours 07/16/21 0118     07/15/21 2315  cefTRIAXone (ROCEPHIN) 1 g in sodium chloride 0.9 % 100 mL IVPB        1 g 200 mL/hr over 30 Minutes Intravenous  Once 07/15/21 2315 07/16/21 0004       PRN meds: acetaminophen **OR** acetaminophen, hydrALAZINE, ondansetron (ZOFRAN) IV, oxyCODONE, traZODone    Objective: Vitals:   07/18/21 0419 07/18/21 0753  BP: 126/62 136/66  Pulse: 70 72  Resp: 18 16  Temp: 99.8 F (37.7 C) 98.1 F (36.7 C)  SpO2: 99% 96%    Intake/Output Summary (Last 24 hours) at 07/18/2021 0911 Last data filed at 07/18/2021 0500 Gross per 24 hour  Intake 600 ml  Output --  Net 600 ml   [facility] Weights   07/15/21 2107 07/16/21 1959  Weight: 68 kg 70.8 kg   Weight change:  Body mass index is 30.48 kg/m.   Physical Exam: General exam: Pleasant, elderly Hispanic female.  Not in pain today Skin: No rashes, lesions or ulcers. HEENT:  Atraumatic, normocephalic, no obvious bleeding Lungs: Clear to auscultation bilaterally CVS: Regular rate and rhythm, no murmur GI/Abd soft, nondistended, nontender anteriorly.  Left flank pain much better today. CNS: Alert, awake, oriented x3 Psychiatry: Mood appropriate Extremities: No pedal edema, no calf tenderness  Data Review: I have personally reviewed the laboratory data and studies available.  Recent Labs  Lab 07/15/21 1951 07/16/21 0223 07/17/21 0228 07/18/21 0343  WBC 13.8* 9.5  9.2 7.1 4.6  NEUTROABS 11.6*  --  5.8 3.0  HGB 13.4 11.8*  12.2 11.7* 12.1  HCT 40.2 35.6*  36.0 34.4* 35.1*  MCV 91.2 92.0  91.8 89.8 88.6  PLT 127* 97*  95* 101* 107*   Recent Labs  Lab 07/15/21 1951 07/16/21 0223 07/17/21 0228 07/18/21 0343  NA 134* 139 134* 138  K 4.4 3.8 4.4 4.3  CL 104 113* 109 112*  CO2 20* 19* 20* 20*  GLUCOSE 304* 195* 215* 62*  BUN 29* '20 15 11  '$ CREATININE 1.19* 1.00  0.97 0.93 0.92  CALCIUM 8.6* 7.7* 8.3* 8.7*    F/u labs ordered Unresulted Labs (From admission, onward)     Start     Ordered   07/23/21 0500  Creatinine, serum  (enoxaparin (LOVENOX)    CrCl >/= 30 ml/min)  Weekly,   R     Comments: while on enoxaparin therapy    07/16/21 0118   07/17/21 0500  CBC with Differential/Platelet  Daily,   R      07/16/21 1147   07/17/21 [id number]  Basic metabolic panel  Daily,   R       07/16/21 1147            Signed, Terrilee Croak, MD [facility] 07/18/2021

## 2021-07-19 ENCOUNTER — Other Ambulatory Visit (HOSPITAL_COMMUNITY): Payer: Self-pay

## 2021-07-19 ENCOUNTER — Other Ambulatory Visit: Payer: Self-pay

## 2021-07-19 LAB — CBC WITH DIFFERENTIAL/PLATELET
Abs Immature Granulocytes: 0.05 10*3/uL (ref 0.00–0.07)
Basophils Absolute: 0 10*3/uL (ref 0.0–0.1)
Basophils Relative: 1 %
Eosinophils Absolute: 0.2 10*3/uL (ref 0.0–0.5)
Eosinophils Relative: 4 %
HCT: 36 % (ref 36.0–46.0)
Hemoglobin: 12.2 g/dL (ref 12.0–15.0)
Immature Granulocytes: 1 %
Lymphocytes Relative: 26 %
Lymphs Abs: 1.3 10*3/uL (ref 0.7–4.0)
MCH: 30 pg (ref 26.0–34.0)
MCHC: 33.9 g/dL (ref 30.0–36.0)
MCV: 88.7 fL (ref 80.0–100.0)
Monocytes Absolute: 1 10*3/uL (ref 0.1–1.0)
Monocytes Relative: 19 %
Neutro Abs: 2.4 10*3/uL (ref 1.7–7.7)
Neutrophils Relative %: 49 %
Platelets: 124 10*3/uL — ABNORMAL LOW (ref 150–400)
RBC: 4.06 MIL/uL (ref 3.87–5.11)
RDW: 12.8 % (ref 11.5–15.5)
WBC: 5 10*3/uL (ref 4.0–10.5)
nRBC: 0 % (ref 0.0–0.2)

## 2021-07-19 LAB — BASIC METABOLIC PANEL
Anion gap: 8 (ref 5–15)
BUN: 13 mg/dL (ref 8–23)
CO2: 20 mmol/L — ABNORMAL LOW (ref 22–32)
Calcium: 8.6 mg/dL — ABNORMAL LOW (ref 8.9–10.3)
Chloride: 108 mmol/L (ref 98–111)
Creatinine, Ser: 0.81 mg/dL (ref 0.44–1.00)
GFR, Estimated: >60 mL/min (ref >60)
Glucose, Bld: 167 mg/dL — ABNORMAL HIGH (ref 70–99)
Potassium: 3.7 mmol/L (ref 3.5–5.1)
Sodium: 136 mmol/L (ref 135–145)

## 2021-07-19 LAB — GLUCOSE, CAPILLARY
Glucose-Capillary: 175 mg/dL — ABNORMAL HIGH (ref 70–99)
Glucose-Capillary: 144 mg/dL — ABNORMAL HIGH (ref 70–99)

## 2021-07-19 MED ORDER — CEPHALEXIN 500 MG PO CAPS
500.0000 mg | ORAL_CAPSULE | Freq: Three times a day (TID) | ORAL | Status: DC
  Administered 2021-07-19 (×2): 500 mg via ORAL
  Filled 2021-07-19 (×2): qty 1

## 2021-07-19 MED ORDER — ROSUVASTATIN CALCIUM 5 MG PO TABS
5.0000 mg | ORAL_TABLET | Freq: Every day | ORAL | 2 refills | Status: DC
  Filled 2021-07-19: qty 30, 30d supply, fill #0
  Filled 2021-08-24: qty 30, 30d supply, fill #1
  Filled 2021-10-27: qty 30, 30d supply, fill #2

## 2021-07-19 MED ORDER — SACCHAROMYCES BOULARDII 250 MG PO CAPS
250.0000 mg | ORAL_CAPSULE | Freq: Two times a day (BID) | ORAL | 0 refills | Status: AC
  Filled 2021-07-19: qty 10, 5d supply, fill #0

## 2021-07-19 MED ORDER — CEPHALEXIN 500 MG PO CAPS
500.0000 mg | ORAL_CAPSULE | Freq: Three times a day (TID) | ORAL | 0 refills | Status: AC
  Filled 2021-07-19: qty 15, 5d supply, fill #0

## 2021-07-19 NOTE — Progress Notes (Signed)
Patient discharging home. Vital signs stable at time of discharge as reflected in discharge summary. Discharge instructions given with spanish interpretor and verbal understanding returned. No questions at this time.

## 2021-07-19 NOTE — Discharge Summary (Signed)
Physician Discharge Summary  [name] [id number] DOB: 11-23-1955 DOA: 07/15/2021  PCP: Charlott Rakes, MD  Admit date: 07/15/2021 Discharge date: 07/19/2021  Admitted From: Home Discharge disposition: Home   Code Status: Full Code   Discharge Diagnosis:   Principal Problem:   Pyelonephritis Active Problems:   HTN (hypertension)   CAD (coronary artery disease), native coronary artery   Type 2 diabetes mellitus with diabetic neuropathy ([place])   Other cirrhosis of liver ([place])   AKI (acute kidney injury) [facility])     Chief Complaint  Patient presents with   Headache   Back Pain    Brief narrative: Brandi Bryant is a 65 y.o. female with PMH significant for DM2, HTN, HLD, CVA, migraine, history of pericarditis, breast cancer in remission. Patient presented to the ED on 8/25 with complaints of left flank pain with nausea, vomiting, fever, chills and dysuria for 3 days.    In the ED, she was noted to have a temperature one 1.6, heart rate in 90s, respiratory rate up to 24, blood pressure stable. Labs with WBC count elevated to 13.8, lactic acid normal at 1.4, glucose level elevated to 304, BUN/creatinine 29/1.19. Urinalysis with hazy urine with large amount leukocytes CT abdomen showed some fullness of the left renal collecting system with perinephric stranding, which could have been secondary to acute viral nephritis or edema from recently passed stone. Patient was started on IV Rocephin and admitted to hospitalist service See below for details  Subjective: Patient was seen and examined this morning. Lying on bed.  Smiling.  No pain.  No fever last 24 hours.  Hospital course: Sepsis secondary to acute left pyelonephritis -Presented with fever, dysuria, nausea, vomiting, left-sided pain. -Urinalysis showed hazy urine with large amount leukocytes.  Blood culture and urine culture sent. -Clinically improving on IV Rocephin. -WBC count normalized.   Fever spikes trending down.  Lactate is normal. -No growth in cultures so far.  Plan to discharge home on oral Keflex today for next 5 days with probiotics. Recent Labs  Lab 07/15/21 1951 07/16/21 0001 07/16/21 0223 07/17/21 0228 07/18/21 0343 07/19/21 0303  WBC 13.8*  --  9.5  9.2 7.1 4.6 5.0  LATICACIDVEN  --  1.4  --   --   --   --   Type 2 diabetes mellitus Hyperglycemia/hypoglycemia -A1c 6.7 in June 2022 -Home meds include Lantus 32 units daily and glipizide 10 mg twice daily.  Patient reports that she had not taken her insulin on the day of admission and hence was running hyperglycemic at presentation.  Because of poor oral intake, her blood sugar level ran low in the hospital and required less insulin.  Oral intake improving now, blood sugar trending up. -Resume home regimen post discharge. Recent Labs  Lab 07/18/21 1120 07/18/21 1626 07/18/21 2112 07/19/21 0739 07/19/21 1127  GLUCAP 84 101* 150* 175* 144*   AKI -Creatinine elevated by more than 0.3 points from baseline.  Improved with IV fluid.  Essential hypertension -Continue Coreg and lisinopril.  Liver cirrhosis -Noted in CT scan.  No ascites or any other signs of portal hypertension. -Talked to patient's son Mr. Rosamaria Lints on 8/27.  He was not aware of her diagnosis.  Ambulatory referral to GI ordered.  CVA/HLD -On aspirin and statin.  Continue aspirin. Dissussed with pharmacist about statin use and liver cirrhosis patient.  Since her liver cirrhosis remains compensated at this time, continue statin.  I switched her from Lipitor to Crestor for better tolerance.  Thrombocytopenia -  Platelet count stable.   Recent Labs  Lab 07/15/21 1951 07/16/21 0223 07/17/21 0228 07/18/21 0343 07/19/21 0303  PLT 127* 97*  95* 101* 107* 124*   History of migraine -on Topamax.  History of breast cancer in remission.  Prior history of pericarditis -Currently has no chest pain.   Allergies as of 07/19/2021        Reactions   Gadolinium Derivatives Nausea And Vomiting   Code: VOM, Desc: Pt began vomiting 45 sec after MRI contrast injection of Multihance, Onset Date: [id number]   Iodinated Diagnostic Agents Nausea And Vomiting        Medication List     STOP taking these medications    atorvastatin 40 MG tablet Commonly known as: LIPITOR       TAKE these medications    aspirin 325 MG EC tablet Take 1 tablet (325 mg total) by mouth daily.   carvedilol 12.5 MG tablet Commonly known as: COREG TAKE 1 TABLET (12.5 MG TOTAL) BY MOUTH 2 (TWO) TIMES DAILY WITH A MEAL.   cephALEXin 500 MG capsule Commonly known as: KEFLEX Take 1 capsule (500 mg total) by mouth every 8 (eight) hours for 5 days.   glipiZIDE 10 MG tablet Commonly known as: GLUCOTROL TAKE 1 TABLET (10 MG TOTAL) BY MOUTH 2 (TWO) TIMES DAILY BEFORE A MEAL.   Lantus SoloStar 100 UNIT/ML Solostar Pen Generic drug: insulin glargine Inject 32 Units into the skin 2 (two) times daily. What changed: when to take this   lisinopril 10 MG tablet Commonly known as: ZESTRIL Tome 1 tableta (10 mg en total) por va oral diariamente. (TAKE 1 TABLET (10 MG TOTAL) BY MOUTH DAILY.) What changed: how much to take   rizatriptan 5 MG tablet Commonly known as: MAXALT TAKE 1 TABLET (5 MG TOTAL) BY MOUTH AS NEEDED FOR MIGRAINE. MAY REPEAT IN 2 HOURS IF NEEDED   rosuvastatin 5 MG tablet Commonly known as: CRESTOR Take 1 tablet (5 mg total) by mouth daily. Start taking on: July 20, 2021   saccharomyces boulardii 250 MG capsule Commonly known as: FLORASTOR Take 1 capsule (250 mg total) by mouth 2 (two) times daily for 5 days.   topiramate 50 MG tablet Commonly known as: TOPAMAX TAKE 1 TABLET (50 MG TOTAL) BY MOUTH 2 (TWO) TIMES DAILY.   traZODone 100 MG tablet Commonly known as: DESYREL TAKE 1 TABLET (100 MG TOTAL) BY MOUTH AT BEDTIME AS NEEDED FOR SLEEP.   TRUEplus Pen Needles 32G X 4 MM Misc Generic drug: Insulin Pen Needle USE TO  INJECT LANTUS DAILY        Discharge Instructions:  Diet Recommendation:  Discharge Diet Orders (From admission, onward)     Start     Ordered   07/19/21 0000  Diet - low sodium heart healthy        07/19/21 1144   07/19/21 0000  Diet Carb Modified        07/19/21 1144              Follow with Primary MD Charlott Rakes, MD in 7 days   Get CBC/BMP checked in next visit within 1 week by PCP or SNF MD ( we routinely change or add medications that can affect your baseline labs and fluid status, therefore we recommend that you get the mentioned basic workup next visit with your PCP, your PCP may decide not to get them or add new tests based on their clinical decision)  On your next visit with your  PCP, please Get Medicines reviewed and adjusted.  Please request your PCP  to go over all Hospital Tests and Procedure/Radiological results at the follow up, please get all Hospital records sent to your Prim MD by signing hospital release before you go home.  Activity: As tolerated with Full fall precautions use walker/cane & assistance as needed  For Heart failure patients - Check your Weight same time everyday, if you gain over 2 pounds, or you develop in leg swelling, experience more shortness of breath or chest pain, call your Primary MD immediately. Follow Cardiac Low Salt Diet and 1.5 lit/day fluid restriction.  If you have smoked or chewed Tobacco in the last 2 yrs please stop smoking, stop any regular Alcohol  and or any Recreational drug use.  If you experience worsening of your admission symptoms, develop shortness of breath, life threatening emergency, suicidal or homicidal thoughts you must seek medical attention immediately by calling 911 or calling your MD immediately  if symptoms less severe.  You Must read complete instructions/literature along with all the possible adverse reactions/side effects for all the Medicines you take and that have been prescribed to you. Take  any new Medicines after you have completely understood and accpet all the possible adverse reactions/side effects.   Do not drive, operate heavy machinery, perform activities at heights, swimming or participation in water activities or provide baby sitting services if your were admitted for syncope or siezures until you have seen by Primary MD or a Neurologist and advised to do so again.  Do not drive when taking Pain medications.  Do not take more than prescribed Pain, Sleep and Anxiety Medications  Wear Seat belts while driving.   Please note You were cared for by a hospitalist during your hospital stay. If you have any questions about your discharge medications or the care you received while you were in the hospital after you are discharged, you can call the unit and asked to speak with the hospitalist on call if the hospitalist that took care of you is not available. Once you are discharged, your primary care physician will handle any further medical issues. Please note that NO REFILLS for any discharge medications will be authorized once you are discharged, as it is imperative that you return to your primary care physician (or establish a relationship with a primary care physician if you do not have one) for your aftercare needs so that they can reassess your need for medications and monitor your lab values.    Follow ups:    Follow-up Information     Charlott Rakes, MD Follow up.   Specialty: Family Medicine Contact information: [place]  [phone]                 Wound care:     Discharge Exam:   Vitals:   07/18/21 0419 07/18/21 0753 07/18/21 2114 07/19/21 0446  BP: 126/62 136/66 126/60 138/65  Pulse: 70 72 62 68  Resp: '18 16 18 18  '$ Temp: 99.8 F (37.7 C) 98.1 F (36.7 C) 99 F (37.2 C) 98 F (36.7 C)  TempSrc: Oral  Oral Oral  SpO2: 99% 96% 97% 96%  Weight:      Height:        Body mass index is 30.48 kg/m.  General  exam: Pleasant, elderly Hispanic female.  Not in distress Skin: No rashes, lesions or ulcers. HEENT: Atraumatic, normocephalic, no obvious bleeding Lungs: Clear to auscultation bilaterally CVS: Regular rate  and rhythm, no murmur GI/Abd soft, nontender, nondistended, bowel sound present CNS: Alert, awake oriented x3 Psychiatry: Mood appropriate Extremities: No pedal edema, no calf tenderness  Time coordinating discharge: 35 minutes   The results of significant diagnostics from this hospitalization (including imaging, microbiology, ancillary and laboratory) are listed below for reference.    Procedures and Diagnostic Studies:   CT Renal Stone Study  Result Date: 07/15/2021 CLINICAL DATA:  Flank pain and dysuria EXAM: CT ABDOMEN AND PELVIS WITHOUT CONTRAST TECHNIQUE: Multidetector CT imaging of the abdomen and pelvis was performed following the standard protocol without IV contrast. COMPARISON:  09/09/2020 FINDINGS: Lower chest: No acute abnormality. Hepatobiliary: Liver is somewhat nodular in appearance consistent with underlying cirrhotic change. The caudate lobe and left lobe are enlarged in a compensatory fashion. Gallbladder has been surgically removed. Pancreas: Unremarkable. No pancreatic ductal dilatation or surrounding inflammatory changes. Spleen: Normal in size without focal abnormality. Adrenals/Urinary Tract: Adrenal glands are within normal limits. No renal calculi are seen. Mild perinephric stranding is noted in the left kidney new from the prior exam. No ureteral stone is noted although these changes could be related to edema from recently passed stone. The bladder is partially distended. Stomach/Bowel: Scattered diverticular change in the descending colon is noted. No findings to suggest diverticulitis are seen. The appendix is within normal limits. Small bowel and stomach are unremarkable. Vascular/Lymphatic: Aortic atherosclerosis. No enlarged abdominal or pelvic lymph nodes.  Reproductive: Uterus and bilateral adnexa are well visualized. Scattered calcifications are noted within the uterus which may be related to uterine fibroid change. Other: No abdominal wall hernia or abnormality. No abdominopelvic ascites. Musculoskeletal: No acute or significant osseous findings. IMPRESSION: Minimal fullness of the left renal collecting system with some perinephric stranding. These changes may be related to edema from a recently passed stone although underlying UTI could present in a similar fashion. Diverticulosis without diverticulitis. Findings suggestive of uterine fibroid change. Hepatic cirrhosis without complicating factors. Electronically Signed   By: Inez Catalina M.D.   On: 07/15/2021 20:20     Labs:   Basic Metabolic Panel: Recent Labs  Lab 07/15/21 1951 07/16/21 0223 07/17/21 0228 07/18/21 0343 07/19/21 0303  NA 134* 139 134* 138 136  K 4.4 3.8 4.4 4.3 3.7  CL 104 113* 109 112* 108  CO2 20* 19* 20* 20* 20*  GLUCOSE 304* 195* 215* 62* 167*  BUN 29* '20 15 11 13  '$ CREATININE 1.19* 1.00  0.97 0.93 0.92 0.81  CALCIUM 8.6* 7.7* 8.3* 8.7* 8.6*   GFR Estimated Creatinine Clearance: 61.6 mL/min (by C-G formula based on SCr of 0.81 mg/dL). Liver Function Tests: Recent Labs  Lab 07/15/21 1951 07/16/21 0223  AST 28 27  ALT 30 26  ALKPHOS 75 80  BILITOT 1.8* 1.9*  PROT 6.4* 5.5*  ALBUMIN 3.1* 2.4*   No results for input(s): LIPASE, AMYLASE in the last 168 hours. No results for input(s): AMMONIA in the last 168 hours. Coagulation profile No results for input(s): INR, PROTIME in the last 168 hours.  CBC: Recent Labs  Lab 07/15/21 1951 07/16/21 0223 07/17/21 0228 07/18/21 0343 07/19/21 0303  WBC 13.8* 9.5  9.2 7.1 4.6 5.0  NEUTROABS 11.6*  --  5.8 3.0 2.4  HGB 13.4 11.8*  12.2 11.7* 12.1 12.2  HCT 40.2 35.6*  36.0 34.4* 35.1* 36.0  MCV 91.2 92.0  91.8 89.8 88.6 88.7  PLT 127* 97*  95* 101* 107* 124*   Cardiac Enzymes: No results for input(s):  CKTOTAL, CKMB, CKMBINDEX,  TROPONINI in the last 168 hours. BNP: Invalid input(s): POCBNP CBG: Recent Labs  Lab 07/18/21 1120 07/18/21 1626 07/18/21 2112 07/19/21 0739 07/19/21 1127  GLUCAP 84 101* 150* 175* 144*   D-Dimer No results for input(s): DDIMER in the last 72 hours. Hgb A1c No results for input(s): HGBA1C in the last 72 hours. Lipid Profile No results for input(s): CHOL, HDL, LDLCALC, TRIG, CHOLHDL, LDLDIRECT in the last 72 hours. Thyroid function studies No results for input(s): TSH, T4TOTAL, T3FREE, THYROIDAB in the last 72 hours.  Invalid input(s): FREET3 Anemia work up No results for input(s): VITAMINB12, FOLATE, FERRITIN, TIBC, IRON, RETICCTPCT in the last 72 hours. Microbiology Recent Results (from the past 240 hour(s))  Resp Panel by RT-PCR (Flu A&B, Covid) Nasopharyngeal Swab     Status: None   Collection Time: 07/15/21  7:01 PM   Specimen: Nasopharyngeal Swab; Nasopharyngeal(NP) swabs in vial transport medium  Result Value Ref Range Status   SARS Coronavirus 2 by RT PCR NEGATIVE NEGATIVE Final    Comment: (NOTE) SARS-CoV-2 target nucleic acids are NOT DETECTED.  The SARS-CoV-2 RNA is generally detectable in upper respiratory specimens during the acute phase of infection. The lowest concentration of SARS-CoV-2 viral copies this assay can detect is 138 copies/mL. A negative result does not preclude SARS-Cov-2 infection and should not be used as the sole basis for treatment or other patient management decisions. A negative result may occur with  improper specimen collection/handling, submission of specimen other than nasopharyngeal swab, presence of viral mutation(s) within the areas targeted by this assay, and inadequate number of viral copies(<138 copies/mL). A negative result must be combined with clinical observations, patient history, and epidemiological information. The expected result is Negative.  Fact Sheet for Patients:   EntrepreneurPulse.com.au  Fact Sheet for Healthcare Providers:  IncredibleEmployment.be  This test is no t yet approved or cleared by the [place] FDA and  has been authorized for detection and/or diagnosis of SARS-CoV-2 by FDA under an Emergency Use Authorization (EUA). This EUA will remain  in effect (meaning this test can be used) for the duration of the COVID-19 declaration under Section 564(b)(1) of the Act, 21 U.S.C.section 360bbb-3(b)(1), unless the authorization is terminated  or revoked sooner.       Influenza A by PCR NEGATIVE NEGATIVE Final   Influenza B by PCR NEGATIVE NEGATIVE Final    Comment: (NOTE) The Xpert Xpress SARS-CoV-2/FLU/RSV plus assay is intended as an aid in the diagnosis of influenza from Nasopharyngeal swab specimens and should not be used as a sole basis for treatment. Nasal washings and aspirates are unacceptable for Xpert Xpress SARS-CoV-2/FLU/RSV testing.  Fact Sheet for Patients: EntrepreneurPulse.com.au  Fact Sheet for Healthcare Providers: IncredibleEmployment.be  This test is not yet approved or cleared by the [place] FDA and has been authorized for detection and/or diagnosis of SARS-CoV-2 by FDA under an Emergency Use Authorization (EUA). This EUA will remain in effect (meaning this test can be used) for the duration of the COVID-19 declaration under Section 564(b)(1) of the Act, 21 U.S.C. section 360bbb-3(b)(1), unless the authorization is terminated or revoked.  Performed at [facility], [place] [address], [place], [place] [address]   Culture, blood (routine x 2)     Status: None (Preliminary result)   Collection Time: 07/16/21 12:01 AM   Specimen: BLOOD  Result Value Ref Range Status   Specimen Description BLOOD SITE NOT SPECIFIED  Final   Special Requests   Final    BOTTLES DRAWN AEROBIC  AND ANAEROBIC Blood Culture adequate volume   Culture    Final    NO GROWTH 3 DAYS Performed at [facility], [place] [address], [place], [place] [address]    Report Status PENDING  Incomplete  Culture, blood (routine x 2)     Status: None (Preliminary result)   Collection Time: 07/16/21  1:22 AM   Specimen: BLOOD  Result Value Ref Range Status   Specimen Description BLOOD SITE NOT SPECIFIED  Final   Special Requests   Final    BOTTLES DRAWN AEROBIC AND ANAEROBIC Blood Culture adequate volume   Culture   Final    NO GROWTH 3 DAYS Performed at [facility], [address], [place], [place] [address]    Report Status PENDING  Incomplete     Signed: Marlowe Aschoff Kinleigh Nault  [facility] 07/19/2021, 11:44 AM

## 2021-07-20 ENCOUNTER — Telehealth: Payer: Self-pay

## 2021-07-20 NOTE — Telephone Encounter (Signed)
Transition Care Management Unsuccessful Follow-up Telephone Call  Calls placed with assistance of [employer] # [id number]/[employer]  Date of discharge and from where:  07/19/2021, [facility]   Attempts:  1st Attempt  Reason for unsuccessful TCM follow-up call:  Unable to leave message , voice mailboxes not set up on # [phone] or # [phone]  Patient has appointment with Dr Joya Gaskins at [facility]  08/10/2021

## 2021-07-21 ENCOUNTER — Telehealth: Payer: Self-pay

## 2021-07-21 LAB — CULTURE, BLOOD (ROUTINE X 2)
Culture: NO GROWTH
Culture: NO GROWTH
Special Requests: ADEQUATE
Special Requests: ADEQUATE

## 2021-07-21 NOTE — Telephone Encounter (Signed)
Transition Care Management Unsuccessful Follow-up Telephone Call  Calls placed with assistance of Spanish [employer] # [id number]/[employer]  Date of discharge and from where:  07/19/2021, [facility]  Attempts:  2nd Attempt  Reason for unsuccessful TCM follow-up call:  Unable to leave message  - voice mailboxes not set up on # [phone] or # [phone]   Patient has appointment with Dr Joya Gaskins at [facility]  08/10/2021

## 2021-07-22 ENCOUNTER — Telehealth: Payer: Self-pay

## 2021-07-22 NOTE — Telephone Encounter (Signed)
Transition Care Management Unsuccessful Follow-up Telephone Call  Date of discharge and from where:  07/19/2021, [facility]   Attempts:  3rd Attempt  Reason for unsuccessful TCM follow-up call:  Unable to reach patient  Calls placed with assistance of [place] # [id number]/[employer]. # [phone]- voicemail not set up. # [phone], patient's daughter answered and the call was subsequently disconnected.  Call placed to # [phone] twice with assistance of [employer] # [id number]/[employer] and the recording stated that the voicemail was not set up.

## 2021-07-29 ENCOUNTER — Other Ambulatory Visit: Payer: Self-pay

## 2021-08-03 ENCOUNTER — Other Ambulatory Visit: Payer: Self-pay

## 2021-08-03 ENCOUNTER — Ambulatory Visit: Payer: Self-pay | Attending: Family Medicine | Admitting: Family Medicine

## 2021-08-03 DIAGNOSIS — M549 Dorsalgia, unspecified: Secondary | ICD-10-CM

## 2021-08-03 DIAGNOSIS — K7469 Other cirrhosis of liver: Secondary | ICD-10-CM

## 2021-08-03 DIAGNOSIS — Z794 Long term (current) use of insulin: Secondary | ICD-10-CM

## 2021-08-03 DIAGNOSIS — E1140 Type 2 diabetes mellitus with diabetic neuropathy, unspecified: Secondary | ICD-10-CM

## 2021-08-03 MED ORDER — MELOXICAM 7.5 MG PO TABS
7.5000 mg | ORAL_TABLET | Freq: Every day | ORAL | 1 refills | Status: DC
  Filled 2021-08-03: qty 30, 30d supply, fill #0

## 2021-08-03 MED ORDER — CYCLOBENZAPRINE HCL 10 MG PO TABS
10.0000 mg | ORAL_TABLET | Freq: Two times a day (BID) | ORAL | 1 refills | Status: DC | PRN
  Filled 2021-08-03: qty 60, 30d supply, fill #0

## 2021-08-03 MED ORDER — GLIPIZIDE 10 MG PO TABS
ORAL_TABLET | Freq: Two times a day (BID) | ORAL | 6 refills | Status: DC
  Filled 2021-08-03: qty 60, fill #0
  Filled 2021-08-24: qty 60, 30d supply, fill #0
  Filled 2021-09-21: qty 60, 30d supply, fill #1
  Filled 2021-10-27: qty 60, 30d supply, fill #2
  Filled 2021-11-29: qty 60, 30d supply, fill #0
  Filled 2021-12-28: qty 60, 30d supply, fill #1
  Filled 2022-01-26: qty 60, 30d supply, fill #2
  Filled 2022-02-21: qty 60, 30d supply, fill #3

## 2021-08-03 NOTE — Progress Notes (Signed)
Virtual Visit via Telephone Note  I connected with Brandi Bryant, on 08/03/2021 at 1:30 PM by telephone due to the COVID-19 pandemic and verified that I am speaking with the correct person using two identifiers.   Consent: I discussed the limitations, risks, security and privacy concerns of performing an evaluation and management service by telephone and the availability of in person appointments. I also discussed with the patient that there may be a patient responsible charge related to this service. The patient expressed understanding and agreed to proceed.   Location of Patient: Home  Location of Provider: Clinic   Persons participating in Telemedicine visit: [facility] Interpreter Dr. Margarita Rana     History of Present Illness: Brandi Bryant is a 65 y.o. year old female with a history of GERD, hypertension, hyperlipidemia, type 2 diabetes mellitus (A1c 8.4) previous CVA, TIA in 03/2018 seen for follow-up visit. From 07/15/2021 through 07/19/2021 she was hospitalized for pyelonephritis and treated with IV antibiotics. CT scan also revealed incidental findings of liver cirrhosis for which she was referred to GI.   She endorses doing well and pyelonephritis symptoms have resolved but she states she was unaware of a diagnosis of Cirrhosis.  Denies alcohol consumption.  Complains of  lower back pain which prevents her from performing her ADLs. Pain is uncomfortable and is always there and affects her when she sleeps. This pain is milder than what she had when she presented to the hospital with Pyelonephritis. Pain does not radiate down her legs and is rated as 4-5. Compliant with her medications and is needing a refill on glipizide which she takes without diabetes mellitus Past Medical History:  Diagnosis Date   Abdominal pain    Allergy    Arthralgia 08/13/2013   Bell palsy 2006   Bell's palsy    Breast cancer ([place]) 2009   Left Breast Cancer    Cataract    Chest pain 09/30/2013   Cholelithiasis    Chronic sinusitis 05/16/2017   Colon cancer screening [id number]   Complication of anesthesia    " tubal ligation, in [place]"  had weakness, couldnt stand up the next day, was given pills for oxygen for Brain."  1 month after I was having loss of attentiviness, still have to focus  carefully.    Coronary artery disease    CVA (cerebral vascular accident) ([place]) 03/30/2018   Depression    Diabetes mellitus    Type II   Dyspnea    at times- when air conditioner is runnimg, heat also    Encounter for screening colonoscopy 10/30/2015   GERD (gastroesophageal reflux disease)    Hepatic steatosis    History of breast cancer 06/01/2012   HTN (hypertension)    Hx of adenomatous polyp of colon 11/07/2019   Hyperlipidemia    Internal carotid artery stenosis 04/03/2018   Personal history of chemotherapy 2009   Left Breast Cancer   Personal history of radiation therapy 2009   Left Breast Cancer   Stroke ([place])    2006, 2015   TIA (transient ischemic attack) 02/20/2014   Allergies  Allergen Reactions   Gadolinium Derivatives Nausea And Vomiting    Code: VOM, Desc: Pt began vomiting 45 sec after MRI contrast injection of Multihance, Onset Date: [id number]     Iodinated Diagnostic Agents Nausea And Vomiting    Current Outpatient Medications on File Prior to Visit  Medication Sig Dispense Refill   aspirin 325 MG EC tablet Take 1 tablet (325 mg total) by  mouth daily. 30 tablet 6   carvedilol (COREG) 12.5 MG tablet TAKE 1 TABLET (12.5 MG TOTAL) BY MOUTH 2 (TWO) TIMES DAILY WITH A MEAL. 60 tablet 6   glipiZIDE (GLUCOTROL) 10 MG tablet TAKE 1 TABLET (10 MG TOTAL) BY MOUTH 2 (TWO) TIMES DAILY BEFORE A MEAL. 60 tablet 0   insulin glargine (LANTUS SOLOSTAR) 100 UNIT/ML Solostar Pen Inject 32 Units into the skin 2 (two) times daily. (Patient taking differently: Inject 32 Units into the skin daily.) 30 mL 6   Insulin Pen Needle (TRUEPLUS PEN NEEDLES)  32G X 4 MM MISC USE TO INJECT LANTUS DAILY 100 each 2   lisinopril (ZESTRIL) 10 MG tablet TAKE 1 TABLET (10 MG TOTAL) BY MOUTH DAILY. (Patient taking differently: Take 10 mg by mouth daily.) 30 tablet 6   rizatriptan (MAXALT) 5 MG tablet TAKE 1 TABLET (5 MG TOTAL) BY MOUTH AS NEEDED FOR MIGRAINE. MAY REPEAT IN 2 HOURS IF NEEDED 10 tablet 3   rosuvastatin (CRESTOR) 5 MG tablet Take 1 tablet (5 mg total) by mouth daily. 30 tablet 2   topiramate (TOPAMAX) 50 MG tablet TAKE 1 TABLET (50 MG TOTAL) BY MOUTH 2 (TWO) TIMES DAILY. 60 tablet 3   traZODone (DESYREL) 100 MG tablet TAKE 1 TABLET (100 MG TOTAL) BY MOUTH AT BEDTIME AS NEEDED FOR SLEEP. 30 tablet 3   [DISCONTINUED] escitalopram (LEXAPRO) 10 MG tablet Take 10 mg by mouth daily.     No current facility-administered medications on file prior to visit.    ROS: See HPI  Observations/Objective: Awake, alert, ranted x3 Not in acute distress Normal mood  CMP Latest Ref Rng & Units 07/19/2021 07/18/2021 07/17/2021  Glucose 70 - 99 mg/dL 167(H) 62(L) 215(H)  BUN 8 - 23 mg/dL '13 11 15  '$ Creatinine 0.44 - 1.00 mg/dL 0.81 0.92 0.93  Sodium 135 - 145 mmol/L 136 138 134(L)  Potassium 3.5 - 5.1 mmol/L 3.7 4.3 4.4  Chloride 98 - 111 mmol/L 108 112(H) 109  CO2 22 - 32 mmol/L 20(L) 20(L) 20(L)  Calcium 8.9 - 10.3 mg/dL 8.6(L) 8.7(L) 8.3(L)  Total Protein 6.5 - 8.1 g/dL - - -  Total Bilirubin 0.3 - 1.2 mg/dL - - -  Alkaline Phos 38 - 126 U/L - - -  AST 15 - 41 U/L - - -  ALT 0 - 44 U/L - - -    Lipid Panel     Component Value Date/Time   CHOL 113 12/02/2020 0837   TRIG 107 12/02/2020 0837   HDL 41 12/02/2020 0837   CHOLHDL 2.8 12/02/2020 0837   CHOLHDL 2.6 03/31/2018 0348   VLDL 33 03/31/2018 0348   LDLCALC 52 12/02/2020 0837   LABVLDL 20 12/02/2020 0837    Lab Results  Component Value Date   HGBA1C 6.7 (A) 05/17/2021    Assessment and Plan: 1. Type 2 diabetes mellitus with diabetic neuropathy, with long-term current use of insulin  (HCC) Controlled with A1c of 6.7 Counseled on Diabetic diet, my plate method, [id number] minutes of moderate intensity exercise/week Blood sugar logs with fasting goals of 80-120 mg/dl, random of less than 180 and in the event of sugars less than 60 mg/dl or greater than 400 mg/dl encouraged to notify the clinic. Advised on the need for annual eye exams, annual foot exams, Pneumonia vaccine. - glipiZIDE (GLUCOTROL) 10 MG tablet; TAKE 1 TABLET (10 MG TOTAL) BY MOUTH 2 (TWO) TIMES DAILY BEFORE A MEAL.  Dispense: 60 tablet; Refill: 6  2. Other cirrhosis  of liver ([place]) Stable Referred to GI during hospitalization  3. Musculoskeletal back pain Advised to apply heat We will place on muscle relaxant and NSAID - cyclobenzaprine (FLEXERIL) 10 MG tablet; Take 1 tablet (10 mg total) by mouth 2 (two) times daily as needed for muscle spasms.  Dispense: 60 tablet; Refill: 1 - meloxicam (MOBIC) 7.5 MG tablet; Take 1 tablet (7.5 mg total) by mouth daily.  Dispense: 30 tablet; Refill: 1   Follow Up Instructions: 3 months for chronic disease management   I discussed the assessment and treatment plan with the patient. The patient was provided an opportunity to ask questions and all were answered. The patient agreed with the plan and demonstrated an understanding of the instructions.   The patient was advised to call back or seek an in-person evaluation if the symptoms worsen or if the condition fails to improve as anticipated.     I provided 14 minutes total of non-face-to-face time during this encounter.   Charlott Rakes, MD, FAAFP. [facility] and [facility] [place], [place]   08/03/2021, 1:30 PM

## 2021-08-04 ENCOUNTER — Ambulatory Visit: Payer: Self-pay

## 2021-08-05 ENCOUNTER — Other Ambulatory Visit: Payer: Self-pay

## 2021-08-10 ENCOUNTER — Ambulatory Visit: Payer: Self-pay | Admitting: Critical Care Medicine

## 2021-08-11 ENCOUNTER — Other Ambulatory Visit: Payer: Self-pay

## 2021-08-11 ENCOUNTER — Ambulatory Visit: Payer: Self-pay | Attending: Family Medicine

## 2021-08-13 ENCOUNTER — Other Ambulatory Visit: Payer: Self-pay

## 2021-08-13 ENCOUNTER — Encounter: Payer: Self-pay | Admitting: Cardiovascular Disease

## 2021-08-13 ENCOUNTER — Ambulatory Visit (INDEPENDENT_AMBULATORY_CARE_PROVIDER_SITE_OTHER): Payer: Self-pay | Admitting: Cardiovascular Disease

## 2021-08-13 VITALS — BP 173/85 | HR 58 | Ht 60.0 in | Wt 148.2 lb

## 2021-08-13 DIAGNOSIS — R002 Palpitations: Secondary | ICD-10-CM

## 2021-08-13 DIAGNOSIS — E785 Hyperlipidemia, unspecified: Secondary | ICD-10-CM

## 2021-08-13 DIAGNOSIS — E1140 Type 2 diabetes mellitus with diabetic neuropathy, unspecified: Secondary | ICD-10-CM

## 2021-08-13 DIAGNOSIS — I2510 Atherosclerotic heart disease of native coronary artery without angina pectoris: Secondary | ICD-10-CM

## 2021-08-13 DIAGNOSIS — M549 Dorsalgia, unspecified: Secondary | ICD-10-CM

## 2021-08-13 DIAGNOSIS — I700 Atherosclerosis of aorta: Secondary | ICD-10-CM

## 2021-08-13 DIAGNOSIS — Z794 Long term (current) use of insulin: Secondary | ICD-10-CM

## 2021-08-13 DIAGNOSIS — M542 Cervicalgia: Secondary | ICD-10-CM

## 2021-08-13 DIAGNOSIS — I10 Essential (primary) hypertension: Secondary | ICD-10-CM

## 2021-08-13 MED ORDER — TRAMADOL HCL 50 MG PO TABS: 50.0000 mg | ORAL_TABLET | Freq: Three times a day (TID) | ORAL | 0 refills | Status: DC | PRN

## 2021-08-13 MED ORDER — CYCLOBENZAPRINE HCL 10 MG PO TABS
10.0000 mg | ORAL_TABLET | Freq: Three times a day (TID) | ORAL | 0 refills | Status: DC | PRN
  Filled 2021-08-13: qty 60, 20d supply, fill #0

## 2021-08-13 MED ORDER — LISINOPRIL 20 MG PO TABS
20.0000 mg | ORAL_TABLET | Freq: Every day | ORAL | 11 refills | Status: DC
  Filled 2021-08-13 – 2021-08-24 (×2): qty 30, 30d supply, fill #0
  Filled 2021-09-21: qty 30, 30d supply, fill #1
  Filled 2021-10-27: qty 30, 30d supply, fill #2
  Filled 2021-11-29: qty 30, 30d supply, fill #0
  Filled 2021-12-28: qty 30, 30d supply, fill #1
  Filled 2022-01-26: qty 30, 30d supply, fill #2
  Filled 2022-02-21: qty 30, 30d supply, fill #3
  Filled 2022-03-24: qty 30, 30d supply, fill #4
  Filled 2022-04-25: qty 30, 30d supply, fill #5
  Filled 2022-05-26: qty 60, 60d supply, fill #6

## 2021-08-13 NOTE — Patient Instructions (Signed)
Medication Instructions:  INCREASE the Lisinopril to 20 mg once daily  TAKE Tramadol 50 mg every 8 hours as needed for pain. No refills.  TAKE Flexeril 10 mg every 8 hours as needed for pain or muscle spasms   *If you need a refill on your cardiac medications before your next appointment, please call your pharmacy*   Lab Work: None ordered If you have labs (blood work) drawn today and your tests are completely normal, you will receive your results only by: Brandi Bryant (if you have MyChart) OR A paper copy in the mail If you have any lab test that is abnormal or we need to change your treatment, we will call you to review the results.   Testing/Procedures: Brandi Bryant- Long Term Monitor Instructions  Your physician has requested you wear a ZIO patch monitor for 7 days.  This is a single patch monitor. [employer] supplies one patch monitor per enrollment. Additional stickers are not available. Please do not apply patch if you will be having a Nuclear Stress Test,  Echocardiogram, Cardiac CT, MRI, or Chest Xray during the period you would be wearing the  monitor. The patch cannot be worn during these tests. You cannot remove and re-apply the  ZIO XT patch monitor.  Your ZIO patch monitor will be mailed 3 day USPS to your address on file. It may take 3-5 days  to receive your monitor after you have been enrolled.  Once you have received your monitor, please review the enclosed instructions. Your monitor  has already been registered assigning a specific monitor serial # to you.  Billing and Patient Assistance Program Information  We have supplied [employer] with any of your insurance information on file for billing purposes. [employer] offers a sliding scale Patient Assistance Program for patients that do not have  insurance, or whose insurance does not completely cover the cost of the ZIO monitor.  You must apply for the Patient Assistance Program to qualify for this discounted rate.  To  apply, please call [employer] at [phone], select option 4, select option 2, ask to apply for  Patient Assistance Program. Brandi Bryant will ask your household income, and how many people  are in your household. They will quote your out-of-pocket cost based on that information.  [employer] will also be able to set up a 81-month, interest-free payment plan if needed.  Applying the monitor   Shave hair from upper left chest.  Hold abrader disc by orange tab. Rub abrader in 40 strokes over the upper left chest as  indicated in your monitor instructions.  Clean area with 4 enclosed alcohol pads. Let dry.  Apply patch as indicated in monitor instructions. Patch will be placed under collarbone on left  side of chest with arrow pointing upward.  Rub patch adhesive wings for 2 minutes. Remove white label marked "1". Remove the white  label marked "2". Rub patch adhesive wings for 2 additional minutes.  While looking in a mirror, press and release button in center of patch. A small green light will  flash 3-4 times. This will be your only indicator that the monitor has been turned on.  Do not shower for the first 24 hours. You may shower after the first 24 hours.  Press the button if you feel a symptom. You will hear a small click. Record Date, Time and  Symptom in the Patient Logbook.  When you are ready to remove the patch, follow instructions on the last 2 pages of Patient  Logbook. Stick patch monitor onto the last page of Patient Logbook.  Place Patient Logbook in the blue and white box. Use locking tab on box and tape box closed  securely. The blue and white box has prepaid postage on it. Please place it in the mailbox as  soon as possible. Your physician should have your test results approximately 7 days after the  monitor has been mailed back to [facility]  Call Brandi Bryant at [phone] if you have questions regarding  your ZIO XT patch monitor. Call them immediately if  you see an orange light blinking on your  monitor.  If your monitor falls off in less than 4 days, contact our Monitor department at [phone].  If your monitor becomes loose or falls off after 4 days call [employer] at [phone] for  suggestions on securing your monitor    Follow-Up: At [facility], you and your health needs are our priority.  As part of our continuing mission to provide you with exceptional heart care, we have created designated Provider Care Teams.  These Care Teams include your primary Cardiologist (physician) and Advanced Practice Providers (APPs -  Physician Assistants and Nurse Practitioners) who all work together to provide you with the care you need, when you need it.  We recommend signing up for the patient portal called "MyChart".  Sign up information is provided on this After Visit Summary.  MyChart is used to connect with patients for Virtual Visits (Telemedicine).  Patients are able to view lab/test results, encounter notes, upcoming appointments, etc.  Non-urgent messages can be sent to your provider as well.   To learn more about what you can do with MyChart, go to [email or web address].    Your next appointment:   12 month(s)  The format for your next appointment:   In Person  Provider:   You may see Dr. Sallyanne Kuster or one of the following Advanced Practice Providers on your designated Care Team:   Almyra Deforest, PA-C Fabian Sharp, [place] or  Roby Lofts, [place]   Other Instructions Dr. Sallyanne Kuster would like you to check your blood pressure daily for the next 2 weeks.  Keep a journal of these daily blood pressure and heart rate readings and call our office or send a message through [place] with the results. Thank you!  It is best to check your BP 1-2 hours after taking your medications to see the medications effectiveness on your BP.    Here are some tips that our clinical pharmacists share for home BP monitoring:          Rest 10 minutes before  taking your blood pressure.          Don't smoke or drink caffeinated beverages for at least 30 minutes before.          Take your blood pressure before (not after) you eat.          Sit comfortably with your back supported and both feet on the floor (don't cross your legs).          Elevate your arm to heart level on a table or a desk.          Use the proper sized cuff. It should fit smoothly and snugly around your bare upper arm. There should be enough room to slip a fingertip under the cuff. The bottom edge of the cuff should be 1 inch above the crease of the elbow.

## 2021-08-13 NOTE — Progress Notes (Signed)
Cardiology Office Note:    Date:  08/14/2021   ID:  Brandi Bryant, DOB January 28, 1956, MRN [id number]  PCP:  Charlott Rakes, MD   [facility] Providers Cardiologist:  None     Referring MD: Elsie Stain, MD   Chief Complaint  Patient presents with   Neck Pain    History of Present Illness:    Brandi Bryant is a 65 y.o. female with a hx of numerous medical problems including essential hypertension, insulin requiring type 2 diabetes mellitus, hypercholesterolemia, left breast cancer status post lumpectomy and chemotherapy without XRT, aortic and coronary artery atherosclerosis on chest CT with moderate nonobstructive disease by cardiac catheterization (2014) and preserved LVEF.  In 2018 she was treated with a long course of colchicine for atypical chest pain which was suspicious for acute pericarditis following an URI.  She is seen today with the help of a Patent attorney.  She is originally from [place].  She does understand English reasonably well.  She complains of palpitations that occur 3-4 times a week.  They are random and are present at rest, for example when she is driving.  The onset is abrupt but the resolution appears to be gradual.  They last for a few minutes.  They make her short of breath, but she does not have chest pain or presyncope/syncope.  She has frequent headaches and her blood pressure is high.  She complains mostly of pain in the right side of her neck.  It is clearly positional and is worsened by turning her head to the contralateral side.  The trapezius muscle is tender to palpation.  She does not have any chest pain at rest or with activity.  She denies shortness of breath.  She has not had palpitations, dizziness, syncope, lower extremity edema or intermittent claudication.  Her medicine list includes meloxicam which she says has not been helping.  It also lists cyclobenzaprine, but she does not recognize this medication and is not  taking it.  Her blood pressure has been running rather high, typically in the 150-170 range.  Her most recent lipid profile showed an LDL of 52 earlier this year.  Glucose control is excellent with a hemoglobin A1c of 6.7% in June 22.  She has normal renal function.  She has not been seen in the cardiology office since March 2019.  She had a low risk nuclear stress test in December 2017.  She had a normal echocardiogram and normal carotid duplex ultrasound in May 2019.  Past Medical History:  Diagnosis Date   Abdominal pain    Allergy    Arthralgia 08/13/2013   Bell palsy 2006   Bell's palsy    Breast cancer ([place]) 2009   Left Breast Cancer   Cataract    Chest pain 09/30/2013   Cholelithiasis    Chronic sinusitis 05/16/2017   Colon cancer screening 62/03/6388   Complication of anesthesia    " tubal ligation, in [place]"  had weakness, couldnt stand up the next day, was given pills for oxygen for Brain."  1 month after I was having loss of attentiviness, still have to focus  carefully.    Coronary artery disease    CVA (cerebral vascular accident) ([place]) 03/30/2018   Depression    Diabetes mellitus    Type II   Dyspnea    at times- when air conditioner is runnimg, heat also    Encounter for screening colonoscopy 10/30/2015   GERD (gastroesophageal reflux disease)    Hepatic steatosis  History of breast cancer 06/01/2012   HTN (hypertension)    Hx of adenomatous polyp of colon 11/07/2019   Hyperlipidemia    Internal carotid artery stenosis 04/03/2018   Personal history of chemotherapy 2009   Left Breast Cancer   Personal history of radiation therapy 2009   Left Breast Cancer   Stroke [facility])    2006, 2015   TIA (transient ischemic attack) 02/20/2014    Past Surgical History:  Procedure Laterality Date   ANKLE ARTHROSCOPY Right 12/14/2016   Procedure: Right Ankle Arthroscopic Debridement;  Surgeon: Newt Minion, MD;  Location: [place];  Service: Orthopedics;  Laterality: Right;    Arm surgery     Left tendon lengthen   BREAST LUMPECTOMY Left 2009   CHOLECYSTECTOMY N/A 12/09/2015   Procedure: LAPAROSCOPIC CHOLECYSTECTOMY WITH INTRAOPERATIVE CHOLANGIOGRAM;  Surgeon: Greer Pickerel, MD;  Location: [place];  Service: General;  Laterality: N/A;   LEFT HEART CATHETERIZATION WITH CORONARY ANGIOGRAM N/A 10/30/2013   Procedure: LEFT HEART CATHETERIZATION WITH CORONARY ANGIOGRAM;  Surgeon: Josue Hector, MD;  Location: [facility] CATH LAB;  Service: Cardiovascular;  Laterality: N/A;   porta cath Right    Removal of Porta cath Right    TUBAL LIGATION      Current Medications: Current Meds  Medication Sig   aspirin 325 MG EC tablet Take 1 tablet (325 mg total) by mouth daily.   carvedilol (COREG) 12.5 MG tablet TAKE 1 TABLET (12.5 MG TOTAL) BY MOUTH 2 (TWO) TIMES DAILY WITH A MEAL.   glipiZIDE (GLUCOTROL) 10 MG tablet TAKE 1 TABLET (10 MG TOTAL) BY MOUTH 2 (TWO) TIMES DAILY BEFORE A MEAL.   insulin glargine (LANTUS SOLOSTAR) 100 UNIT/ML Solostar Pen Inject 32 Units into the skin 2 (two) times daily. (Patient taking differently: Inject 32 Units into the skin daily.)   Insulin Pen Needle (TRUEPLUS PEN NEEDLES) 32G X 4 MM MISC USE TO INJECT LANTUS DAILY   meloxicam (MOBIC) 7.5 MG tablet Take 1 tablet (7.5 mg total) by mouth daily.   rizatriptan (MAXALT) 5 MG tablet TAKE 1 TABLET (5 MG TOTAL) BY MOUTH AS NEEDED FOR MIGRAINE. MAY REPEAT IN 2 HOURS IF NEEDED   rosuvastatin (CRESTOR) 5 MG tablet Take 1 tablet (5 mg total) by mouth daily.   traMADol (ULTRAM) 50 MG tablet Take 1 tablet (50 mg total) by mouth every 8 (eight) hours as needed (for pain and muscle spasms).   traZODone (DESYREL) 100 MG tablet TAKE 1 TABLET (100 MG TOTAL) BY MOUTH AT BEDTIME AS NEEDED FOR SLEEP.   [DISCONTINUED] cyclobenzaprine (FLEXERIL) 10 MG tablet Take 1 tablet (10 mg total) by mouth 2 (two) times daily as needed for muscle spasms.   [DISCONTINUED] lisinopril (ZESTRIL) 10 MG tablet TAKE 1 TABLET (10 MG TOTAL) BY MOUTH  DAILY. (Patient taking differently: Take 10 mg by mouth daily.)   [DISCONTINUED] topiramate (TOPAMAX) 50 MG tablet TAKE 1 TABLET (50 MG TOTAL) BY MOUTH 2 (TWO) TIMES DAILY.     Allergies:   Gadolinium derivatives and Iodinated diagnostic agents   Social History   Socioeconomic History   Marital status: Single    Spouse name: Not on file   Number of children: 6   Years of education: Not on file   Highest education level: High school graduate  Occupational History   Not on file  Tobacco Use   Smoking status: Never   Smokeless tobacco: Never  Vaping Use   Vaping Use: Never used  Substance and Sexual Activity  Alcohol use: No   Drug use: No   Sexual activity: Not Currently    Birth control/protection: Surgical  Other Topics Concern   Not on file  Social History Narrative   Single with 6 children    lives with her daughter   Right handed   Never smoker no drug use no alcohol   Social Determinants of Health   Financial Resource Strain: Not on file  Food Insecurity: Not on file  Transportation Needs: Not on file  Physical Activity: Not on file  Stress: Not on file  Social Connections: Not on file     Family History: The patient's family history includes Diabetes in her mother; Hypertension in her mother; Migraines in her daughter; Thyroid cancer in her sister. There is no history of Colon cancer, Esophageal cancer, Stomach cancer, or Rectal cancer.  ROS:   Please see the history of present illness.     All other systems reviewed and are negative.  EKGs/Labs/Other Studies Reviewed:    The following studies were reviewed today: Nuclear stress test 11/11/2016  Nuclear stress EF: 67%. Blood pressure demonstrated a normal response to exercise. There was no ST segment deviation noted during stress. The study is normal. This is a low risk study. The left ventricular ejection fraction is hyperdynamic (>65%).  Carotid duplex ultrasound 03/31/2018  - Left ventricle: The  cavity size was normal. Systolic function was    normal. The estimated ejection fraction was in the range of 60%    to 65%. Wall motion was normal; there were no regional wall    motion abnormalities. Left ventricular diastolic function    parameters were normal.  - Atrial septum: No defect or patent foramen ovale was identified.  Echocardiogram 03/31/2018 - Left ventricle: The cavity size was normal. Systolic function was    normal. The estimated ejection fraction was in the range of 60%    to 65%. Wall motion was normal; there were no regional wall    motion abnormalities. Left ventricular diastolic function    parameters were normal.  - Atrial septum: No defect or patent foramen ovale was identified.     EKG:  EKG is ordered today.  The ekg ordered today demonstrates sinus bradycardia at 58 bpm otherwise a completely normal tracing  Recent Labs: 07/16/2021: ALT 26 07/19/2021: BUN 13; Creatinine, Ser 0.81; Hemoglobin 12.2; Platelets 124; Potassium 3.7; Sodium 136  Recent Lipid Panel    Component Value Date/Time   CHOL 113 12/02/2020 0837   TRIG 107 12/02/2020 0837   HDL 41 12/02/2020 0837   CHOLHDL 2.8 12/02/2020 0837   CHOLHDL 2.6 03/31/2018 0348   VLDL 33 03/31/2018 0348   LDLCALC 52 12/02/2020 0837     Risk Assessment/Calculations:           Physical Exam:    VS:  BP (!) 173/85   Pulse (!) 58   Ht 5' (1.524 m)   Wt 67.2 kg   SpO2 99%   BMI 28.94 kg/m     Wt Readings from Last 3 Encounters:  08/13/21 67.2 kg  07/16/21 70.8 kg  06/08/21 69 kg     GEN: Appears well.  Well nourished, well developed in no acute distress HEENT: Normal NECK: No JVD; No carotid bruits LYMPHATICS: No lymphadenopathy CARDIAC: RRR, no murmurs, rubs, gallops RESPIRATORY:  Clear to auscultation without rales, wheezing or rhonchi  ABDOMEN: Soft, non-tender, non-distended MUSCULOSKELETAL:  No edema; No deformity  SKIN: Warm and dry NEUROLOGIC:  Alert and oriented  x 3 PSYCHIATRIC:   Normal affect   ASSESSMENT:    1. Palpitations   2. Musculoskeletal neck pain   3. Primary hypertension   4. Type 2 diabetes mellitus with diabetic neuropathy, with long-term current use of insulin ([place])   5. Dyslipidemia (high LDL; low HDL)   6. Coronary artery disease involving native coronary artery of native heart without angina pectoris   7. Atherosclerosis of aorta (HCC)    PLAN:    In order of problems listed above:  Palpitations: These happen about 3 times a week and they scare her, although they do not associate severe cardiac symptoms otherwise.  We will have her wear a monitor.   Neck pain: Appears clearly musculoskeletal.  NSAIDs have not really helped and may worsen her hypertension.  Gave her a short prescription for tramadol and filled the cyclobenzaprine prescription.  Advised her that both these medicines may cause some sedation that she should not drive after taking them. HTN: Some degree of the acute increase in blood pressure may be related to pain,, but it also may proceed the current painful episode.  Increase the lisinopril to 20 mg daily.  Send [place] a log of her blood pressures after about a week. DM: Excellent control.  As far as I can tell without evidence of renal complications, but still very important to avoid NSAIDs if possible. HLP: Rather low HDL, but excellent LDL cholesterol on statin.  Continue same medications. Coronary atherosclerosis: Moderate on cath in 2014, low risk nuclear stress test 2017.  Current symptoms do not sound at all like coronary insufficiency.  The focus is on risk factor modification. Aortic atherosclerosis: Seen on CT chest.        Medication Adjustments/Labs and Tests Ordered: Current medicines are reviewed at length with the patient today.  Concerns regarding medicines are outlined above.  Orders Placed This Encounter  Procedures   LONG TERM MONITOR (3-14 DAYS)   EKG 12-Lead   Meds ordered this encounter  Medications    traMADol (ULTRAM) 50 MG tablet    Sig: Take 1 tablet (50 mg total) by mouth every 8 (eight) hours as needed (for pain and muscle spasms).    Dispense:  30 tablet    Refill:  0   cyclobenzaprine (FLEXERIL) 10 MG tablet    Sig: Take 1 tablet (10 mg total) by mouth every 8 (eight) hours as needed for muscle spasms.    Dispense:  60 tablet    Refill:  0   lisinopril (ZESTRIL) 20 MG tablet    Sig: Take 1 tablet (20 mg total) by mouth daily.    Dispense:  30 tablet    Refill:  11    Patient Instructions  Medication Instructions:  INCREASE the Lisinopril to 20 mg once daily  TAKE Tramadol 50 mg every 8 hours as needed for pain. No refills.  TAKE Flexeril 10 mg every 8 hours as needed for pain or muscle spasms   *If you need a refill on your cardiac medications before your next appointment, please call your pharmacy*   Lab Work: None ordered If you have labs (blood work) drawn today and your tests are completely normal, you will receive your results only by: [place] (if you have MyChart) OR A paper copy in the mail If you have any lab test that is abnormal or we need to change your treatment, we will call you to review the results.   Testing/Procedures: [place]- Long Term Monitor Instructions  Your physician has requested you wear a ZIO patch monitor for 7 days.  This is a single patch monitor. [employer] supplies one patch monitor per enrollment. Additional stickers are not available. Please do not apply patch if you will be having a Nuclear Stress Test,  Echocardiogram, Cardiac CT, MRI, or Chest Xray during the period you would be wearing the  monitor. The patch cannot be worn during these tests. You cannot remove and re-apply the  ZIO XT patch monitor.  Your ZIO patch monitor will be mailed 3 day USPS to your address on file. It may take 3-5 days  to receive your monitor after you have been enrolled.  Once you have received your monitor, please review the enclosed  instructions. Your monitor  has already been registered assigning a specific monitor serial # to you.  Billing and Patient Assistance Program Information  We have supplied [employer] with any of your insurance information on file for billing purposes. [employer] offers a sliding scale Patient Assistance Program for patients that do not have  insurance, or whose insurance does not completely cover the cost of the ZIO monitor.  You must apply for the Patient Assistance Program to qualify for this discounted rate.  To apply, please call [employer] at [phone], select option 4, select option 2, ask to apply for  Patient Assistance Program. Theodore Demark will ask your household income, and how many people  are in your household. They will quote your out-of-pocket cost based on that information.  [employer] will also be able to set up a 47-month, interest-free payment plan if needed.  Applying the monitor   Shave hair from upper left chest.  Hold abrader disc by orange tab. Rub abrader in 40 strokes over the upper left chest as  indicated in your monitor instructions.  Clean area with 4 enclosed alcohol pads. Let dry.  Apply patch as indicated in monitor instructions. Patch will be placed under collarbone on left  side of chest with arrow pointing upward.  Rub patch adhesive wings for 2 minutes. Remove white label marked "1". Remove the white  label marked "2". Rub patch adhesive wings for 2 additional minutes.  While looking in a mirror, press and release button in center of patch. A small green light will  flash 3-4 times. This will be your only indicator that the monitor has been turned on.  Do not shower for the first 24 hours. You may shower after the first 24 hours.  Press the button if you feel a symptom. You will hear a small click. Record Date, Time and  Symptom in the Patient Logbook.  When you are ready to remove the patch, follow instructions on the last 2 pages of Patient  Logbook. Stick patch  monitor onto the last page of Patient Logbook.  Place Patient Logbook in the blue and white box. Use locking tab on box and tape box closed  securely. The blue and white box has prepaid postage on it. Please place it in the mailbox as  soon as possible. Your physician should have your test results approximately 7 days after the  monitor has been mailed back to [facility].  Call [place] at [phone] if you have questions regarding  your ZIO XT patch monitor. Call them immediately if you see an orange light blinking on your  monitor.  If your monitor falls off in less than 4 days, contact our Monitor department at [phone].  If your monitor becomes loose or falls off  after 4 days call [employer] at [phone] for  suggestions on securing your monitor    Follow-Up: At [facility], you and your health needs are our priority.  As part of our continuing mission to provide you with exceptional heart care, we have created designated Provider Care Teams.  These Care Teams include your primary Cardiologist (physician) and Advanced Practice Providers (APPs -  Physician Assistants and Nurse Practitioners) who all work together to provide you with the care you need, when you need it.  We recommend signing up for the patient portal called "MyChart".  Sign up information is provided on this After Visit Summary.  MyChart is used to connect with patients for Virtual Visits (Telemedicine).  Patients are able to view lab/test results, encounter notes, upcoming appointments, etc.  Non-urgent messages can be sent to your provider as well.   To learn more about what you can do with MyChart, go to [email or web address].    Your next appointment:   12 month(s)  The format for your next appointment:   In Person  Provider:   You may see Dr. Sallyanne Kuster or one of the following Advanced Practice Providers on your designated Care Team:   Almyra Deforest, PA-C Fabian Sharp, [place] or   Roby Lofts, [place]   Other Instructions Dr. Sallyanne Kuster would like you to check your blood pressure daily for the next 2 weeks.  Keep a journal of these daily blood pressure and heart rate readings and call our office or send a message through [place] with the results. Thank you!  It is best to check your BP 1-2 hours after taking your medications to see the medications effectiveness on your BP.    Here are some tips that our clinical pharmacists share for home BP monitoring:          Rest 10 minutes before taking your blood pressure.          Don't smoke or drink caffeinated beverages for at least 30 minutes before.          Take your blood pressure before (not after) you eat.          Sit comfortably with your back supported and both feet on the floor (don't cross your legs).          Elevate your arm to heart level on a table or a desk.          Use the proper sized cuff. It should fit smoothly and snugly around your bare upper arm. There should be enough room to slip a fingertip under the cuff. The bottom edge of the cuff should be 1 inch above the crease of the elbow.    Signed, Sanda Klein, MD  08/14/2021 5:58 AM    [facility]

## 2021-08-14 ENCOUNTER — Encounter: Payer: Self-pay | Admitting: Cardiovascular Disease

## 2021-08-16 ENCOUNTER — Ambulatory Visit (INDEPENDENT_AMBULATORY_CARE_PROVIDER_SITE_OTHER): Payer: No Typology Code available for payment source

## 2021-08-16 DIAGNOSIS — R002 Palpitations: Secondary | ICD-10-CM

## 2021-08-16 NOTE — Progress Notes (Unsigned)
Enrolled patient for a 7 day Zio XT monitor to be mailed to patients home.  

## 2021-08-20 ENCOUNTER — Other Ambulatory Visit: Payer: Self-pay

## 2021-08-21 DIAGNOSIS — R002 Palpitations: Secondary | ICD-10-CM

## 2021-08-24 ENCOUNTER — Other Ambulatory Visit: Payer: Self-pay

## 2021-09-09 ENCOUNTER — Encounter: Payer: Self-pay | Admitting: *Deleted

## 2021-09-21 ENCOUNTER — Other Ambulatory Visit: Payer: Self-pay | Admitting: Pharmacist

## 2021-09-21 ENCOUNTER — Other Ambulatory Visit: Payer: Self-pay

## 2021-09-21 MED ORDER — BASAGLAR KWIKPEN 100 UNIT/ML ~~LOC~~ SOPN
32.0000 [IU] | PEN_INJECTOR | Freq: Two times a day (BID) | SUBCUTANEOUS | 2 refills | Status: DC
  Filled 2021-09-21 – 2021-11-29 (×2): qty 15, 23d supply, fill #0

## 2021-10-27 ENCOUNTER — Other Ambulatory Visit: Payer: Self-pay

## 2021-11-04 ENCOUNTER — Encounter: Payer: Self-pay | Admitting: Internal Medicine

## 2021-11-08 ENCOUNTER — Ambulatory Visit: Payer: Self-pay | Admitting: Family Medicine

## 2021-11-16 ENCOUNTER — Inpatient Hospital Stay (HOSPITAL_COMMUNITY)
Admission: EM | Admit: 2021-11-16 | Discharge: 2021-11-18 | DRG: 313 | Disposition: A | Discharge status: Home / Self Care | Payer: No Typology Code available for payment source | Attending: Internal Medicine | Admitting: Internal Medicine

## 2021-11-16 ENCOUNTER — Ambulatory Visit (HOSPITAL_COMMUNITY)
Admission: EM | Admit: 2021-11-16 | Discharge: 2021-11-16 | Disposition: A | Discharge status: Home / Self Care | Payer: No Typology Code available for payment source | Attending: Emergency Medicine | Admitting: Emergency Medicine

## 2021-11-16 ENCOUNTER — Emergency Department (HOSPITAL_COMMUNITY): Payer: No Typology Code available for payment source

## 2021-11-16 ENCOUNTER — Encounter (HOSPITAL_COMMUNITY): Payer: Self-pay

## 2021-11-16 ENCOUNTER — Other Ambulatory Visit: Payer: Self-pay

## 2021-11-16 DIAGNOSIS — Z791 Long term (current) use of non-steroidal anti-inflammatories (NSAID): Secondary | ICD-10-CM

## 2021-11-16 DIAGNOSIS — I2510 Atherosclerotic heart disease of native coronary artery without angina pectoris: Secondary | ICD-10-CM | POA: Diagnosis present

## 2021-11-16 DIAGNOSIS — Z923 Personal history of irradiation: Secondary | ICD-10-CM

## 2021-11-16 DIAGNOSIS — R0602 Shortness of breath: Secondary | ICD-10-CM

## 2021-11-16 DIAGNOSIS — Z8249 Family history of ischemic heart disease and other diseases of the circulatory system: Secondary | ICD-10-CM

## 2021-11-16 DIAGNOSIS — Z794 Long term (current) use of insulin: Secondary | ICD-10-CM

## 2021-11-16 DIAGNOSIS — E119 Type 2 diabetes mellitus without complications: Secondary | ICD-10-CM

## 2021-11-16 DIAGNOSIS — I493 Ventricular premature depolarization: Secondary | ICD-10-CM | POA: Diagnosis present

## 2021-11-16 DIAGNOSIS — R079 Chest pain, unspecified: Secondary | ICD-10-CM

## 2021-11-16 DIAGNOSIS — K760 Fatty (change of) liver, not elsewhere classified: Secondary | ICD-10-CM | POA: Diagnosis present

## 2021-11-16 DIAGNOSIS — Z8673 Personal history of transient ischemic attack (TIA), and cerebral infarction without residual deficits: Secondary | ICD-10-CM

## 2021-11-16 DIAGNOSIS — Z853 Personal history of malignant neoplasm of breast: Secondary | ICD-10-CM

## 2021-11-16 DIAGNOSIS — Z7984 Long term (current) use of oral hypoglycemic drugs: Secondary | ICD-10-CM

## 2021-11-16 DIAGNOSIS — Z9221 Personal history of antineoplastic chemotherapy: Secondary | ICD-10-CM

## 2021-11-16 DIAGNOSIS — Z79899 Other long term (current) drug therapy: Secondary | ICD-10-CM

## 2021-11-16 DIAGNOSIS — I10 Essential (primary) hypertension: Secondary | ICD-10-CM | POA: Diagnosis present

## 2021-11-16 DIAGNOSIS — E0800 Diabetes mellitus due to underlying condition with hyperosmolarity without nonketotic hyperglycemic-hyperosmolar coma (NKHHC): Secondary | ICD-10-CM

## 2021-11-16 DIAGNOSIS — Z86010 Personal history of colonic polyps: Secondary | ICD-10-CM

## 2021-11-16 DIAGNOSIS — E785 Hyperlipidemia, unspecified: Secondary | ICD-10-CM | POA: Diagnosis present

## 2021-11-16 DIAGNOSIS — R0789 Other chest pain: Principal | ICD-10-CM | POA: Diagnosis present

## 2021-11-16 DIAGNOSIS — Z9049 Acquired absence of other specified parts of digestive tract: Secondary | ICD-10-CM

## 2021-11-16 DIAGNOSIS — Z20822 Contact with and (suspected) exposure to covid-19: Secondary | ICD-10-CM | POA: Diagnosis present

## 2021-11-16 DIAGNOSIS — Z91041 Radiographic dye allergy status: Secondary | ICD-10-CM

## 2021-11-16 DIAGNOSIS — Z833 Family history of diabetes mellitus: Secondary | ICD-10-CM

## 2021-11-16 DIAGNOSIS — Z7982 Long term (current) use of aspirin: Secondary | ICD-10-CM

## 2021-11-16 DIAGNOSIS — D696 Thrombocytopenia, unspecified: Secondary | ICD-10-CM | POA: Diagnosis present

## 2021-11-16 DIAGNOSIS — K7460 Unspecified cirrhosis of liver: Secondary | ICD-10-CM | POA: Diagnosis present

## 2021-11-16 DIAGNOSIS — R002 Palpitations: Secondary | ICD-10-CM | POA: Diagnosis present

## 2021-11-16 DIAGNOSIS — K219 Gastro-esophageal reflux disease without esophagitis: Secondary | ICD-10-CM | POA: Diagnosis present

## 2021-11-16 LAB — BASIC METABOLIC PANEL
Anion gap: 10 (ref 5–15)
BUN: 16 mg/dL (ref 8–23)
CO2: 18 mmol/L — ABNORMAL LOW (ref 22–32)
Calcium: 9.4 mg/dL (ref 8.9–10.3)
Chloride: 109 mmol/L (ref 98–111)
Creatinine, Ser: 0.99 mg/dL (ref 0.44–1.00)
GFR, Estimated: >60 mL/min (ref >60)
Glucose, Bld: 167 mg/dL — ABNORMAL HIGH (ref 70–99)
Potassium: 3.7 mmol/L (ref 3.5–5.1)
Sodium: 137 mmol/L (ref 135–145)

## 2021-11-16 LAB — CBG MONITORING, ED: Glucose-Capillary: 221 mg/dL — ABNORMAL HIGH (ref 70–99)

## 2021-11-16 LAB — TROPONIN I (HIGH SENSITIVITY)
Troponin I (High Sensitivity): 6 ng/L (ref <18)
Troponin I (High Sensitivity): 6 ng/L (ref <18)
Troponin I (High Sensitivity): 7 ng/L (ref <18)

## 2021-11-16 LAB — CBC
HCT: 40.3 % (ref 36.0–46.0)
HCT: 38.7 % (ref 36.0–46.0)
Hemoglobin: 14.3 g/dL (ref 12.0–15.0)
Hemoglobin: 13.7 g/dL (ref 12.0–15.0)
MCH: 30.7 pg (ref 26.0–34.0)
MCH: 30.9 pg (ref 26.0–34.0)
MCHC: 35.5 g/dL (ref 30.0–36.0)
MCHC: 35.4 g/dL (ref 30.0–36.0)
MCV: 86.5 fL (ref 80.0–100.0)
MCV: 87.4 fL (ref 80.0–100.0)
Platelets: 146 10*3/uL — ABNORMAL LOW (ref 150–400)
Platelets: 134 10*3/uL — ABNORMAL LOW (ref 150–400)
RBC: 4.66 MIL/uL (ref 3.87–5.11)
RBC: 4.43 MIL/uL (ref 3.87–5.11)
RDW: 12.1 % (ref 11.5–15.5)
RDW: 12.3 % (ref 11.5–15.5)
WBC: 6 10*3/uL (ref 4.0–10.5)
WBC: 6.1 10*3/uL (ref 4.0–10.5)
nRBC: 0 % (ref 0.0–0.2)
nRBC: 0 % (ref 0.0–0.2)

## 2021-11-16 LAB — RESP PANEL BY RT-PCR (FLU A&B, COVID) ARPGX2
Influenza A by PCR: NEGATIVE
Influenza B by PCR: NEGATIVE
SARS Coronavirus 2 by RT PCR: NEGATIVE

## 2021-11-16 LAB — TSH: TSH: 1.956 u[IU]/mL (ref 0.350–4.500)

## 2021-11-16 LAB — CREATININE, SERUM
Creatinine, Ser: 0.8 mg/dL (ref 0.44–1.00)
GFR, Estimated: >60 mL/min (ref >60)

## 2021-11-16 MED ORDER — ENOXAPARIN SODIUM 40 MG/0.4ML IJ SOSY
40.0000 mg | PREFILLED_SYRINGE | INTRAMUSCULAR | Status: DC
  Administered 2021-11-16 – 2021-11-17 (×2): 40 mg via SUBCUTANEOUS
  Filled 2021-11-16 (×2): qty 0.4

## 2021-11-16 MED ORDER — ASPIRIN EC 325 MG PO TBEC
325.0000 mg | DELAYED_RELEASE_TABLET | Freq: Every day | ORAL | Status: DC
  Administered 2021-11-17 – 2021-11-18 (×2): 325 mg via ORAL
  Filled 2021-11-16 (×2): qty 1

## 2021-11-16 MED ORDER — INSULIN ASPART 100 UNIT/ML IJ SOLN
0.0000 [IU] | Freq: Three times a day (TID) | INTRAMUSCULAR | Status: DC
  Administered 2021-11-17: 18:00:00 5 [IU] via SUBCUTANEOUS
  Administered 2021-11-17 (×2): 2 [IU] via SUBCUTANEOUS

## 2021-11-16 MED ORDER — ACETAMINOPHEN 500 MG PO TABS
1000.0000 mg | ORAL_TABLET | Freq: Four times a day (QID) | ORAL | Status: DC | PRN
  Administered 2021-11-16 – 2021-11-17 (×2): 1000 mg via ORAL
  Filled 2021-11-16 (×2): qty 2

## 2021-11-16 MED ORDER — CARVEDILOL 12.5 MG PO TABS
12.5000 mg | ORAL_TABLET | Freq: Two times a day (BID) | ORAL | Status: DC
  Administered 2021-11-17 – 2021-11-18 (×3): 12.5 mg via ORAL
  Filled 2021-11-16 (×3): qty 1

## 2021-11-16 MED ORDER — ROSUVASTATIN CALCIUM 5 MG PO TABS
5.0000 mg | ORAL_TABLET | Freq: Every day | ORAL | Status: DC
  Administered 2021-11-16 – 2021-11-18 (×3): 5 mg via ORAL
  Filled 2021-11-16 (×3): qty 1

## 2021-11-16 MED ORDER — LISINOPRIL 20 MG PO TABS
20.0000 mg | ORAL_TABLET | Freq: Every day | ORAL | Status: DC
  Administered 2021-11-17 – 2021-11-18 (×2): 20 mg via ORAL
  Filled 2021-11-16 (×2): qty 1

## 2021-11-16 MED ORDER — TRAZODONE HCL 50 MG PO TABS
50.0000 mg | ORAL_TABLET | Freq: Every evening | ORAL | Status: DC | PRN
  Filled 2021-11-16: qty 1

## 2021-11-16 MED ORDER — ASPIRIN 81 MG PO CHEW
324.0000 mg | CHEWABLE_TABLET | Freq: Once | ORAL | Status: AC
  Administered 2021-11-16: 19:00:00 324 mg via ORAL
  Filled 2021-11-16: qty 4

## 2021-11-16 NOTE — H&P (Signed)
History and Physical    Brandi Bryant ASN:[id number] DOB: May 26, 1956 DOA: 11/16/2021  PCP: Charlott Rakes, MD  Patient coming from: Home.  Spanish translation provided by patient's daughter.  Chief Complaint: Palpitation chest pain and shortness of breath.  HPI: Brandi Bryant is a 65 y.o. female with history of hypertension, diabetes mellitus type 2, CVA, palpitations, cirrhosis of liver, breast cancer status post left-sided lumpectomy and radiation has been experiencing palpitation off-and-on for the last 3 to 4 days which is associated with chest tightness.  Symptoms are not related to exertion.  Denies any associated productive cough fever chills abdominal pain or nausea vomiting.  Patient does feel short of breath when the symptoms happen.  ED Course: In the ER EKG shows sinus bradycardia with heart rate around 55 beats minute with nonspecific ST changes.  Cardiac markers are negative.  COVID test was negative.  Give the risk factors patient admitted for further observation.  Review of Systems: As per HPI, rest all negative.   Past Medical History:  Diagnosis Date   Abdominal pain    Allergy    Arthralgia 08/13/2013   Bell palsy 2006   Bell's palsy    Breast cancer ([place]) 2009   Left Breast Cancer   Cataract    Chest pain 09/30/2013   Cholelithiasis    Chronic sinusitis 05/16/2017   Colon cancer screening 19/01/7901   Complication of anesthesia    " tubal ligation, in [place]"  had weakness, couldnt stand up the next day, was given pills for oxygen for Brain."  1 month after I was having loss of attentiviness, still have to focus  carefully.    Coronary artery disease    CVA (cerebral vascular accident) ([place]) 03/30/2018   Depression    Diabetes mellitus    Type II   Dyspnea    at times- when air conditioner is runnimg, heat also    Encounter for screening colonoscopy 10/30/2015   GERD (gastroesophageal reflux disease)    Hepatic steatosis    History  of breast cancer 06/01/2012   HTN (hypertension)    Hx of adenomatous polyp of colon 11/07/2019   Hyperlipidemia    Internal carotid artery stenosis 04/03/2018   Personal history of chemotherapy 2009   Left Breast Cancer   Personal history of radiation therapy 2009   Left Breast Cancer   Stroke [facility])    2006, 2015   TIA (transient ischemic attack) 02/20/2014    Past Surgical History:  Procedure Laterality Date   ANKLE ARTHROSCOPY Right 12/14/2016   Procedure: Right Ankle Arthroscopic Debridement;  Surgeon: Newt Minion, MD;  Location: [place];  Service: Orthopedics;  Laterality: Right;   Arm surgery     Left tendon lengthen   BREAST LUMPECTOMY Left 2009   CHOLECYSTECTOMY N/A 12/09/2015   Procedure: LAPAROSCOPIC CHOLECYSTECTOMY WITH INTRAOPERATIVE CHOLANGIOGRAM;  Surgeon: Greer Pickerel, MD;  Location: [place];  Service: General;  Laterality: N/A;   LEFT HEART CATHETERIZATION WITH CORONARY ANGIOGRAM N/A 10/30/2013   Procedure: LEFT HEART CATHETERIZATION WITH CORONARY ANGIOGRAM;  Surgeon: Josue Hector, MD;  Location: [facility] CATH LAB;  Service: Cardiovascular;  Laterality: N/A;   porta cath Right    Removal of Porta cath Right    TUBAL LIGATION       reports that she has never smoked. She has never used smokeless tobacco. She reports that she does not drink alcohol and does not use drugs.  Allergies  Allergen Reactions   Gadolinium Derivatives Nausea And Vomiting  Code: VOM, Desc: Pt began vomiting 45 sec after MRI contrast injection of Multihance, Onset Date: [id number]     Iodinated Contrast Media Nausea And Vomiting    Family History  Problem Relation Age of Onset   Thyroid cancer Sister    Hypertension Mother    Diabetes Mother    Migraines Daughter    Colon cancer Neg Hx    Esophageal cancer Neg Hx    Stomach cancer Neg Hx    Rectal cancer Neg Hx     Prior to Admission medications   Medication Sig Start Date End Date Taking? Authorizing Provider  aspirin 325 MG EC tablet  Take 1 tablet (325 mg total) by mouth daily. 06/14/18   Argentina Donovan, PA-C  carvedilol (COREG) 12.5 MG tablet TAKE 1 TABLET (12.5 MG TOTAL) BY MOUTH 2 (TWO) TIMES DAILY WITH A MEAL. 05/13/21 05/13/22  Mayers, Cari S, PA-C  cyclobenzaprine (FLEXERIL) 10 MG tablet Take 1 tablet (10 mg total) by mouth every 8 (eight) hours as needed for muscle spasms. 08/13/21   Croitoru, Mihai, MD  glipiZIDE (GLUCOTROL) 10 MG tablet TAKE 1 TABLET (10 MG TOTAL) BY MOUTH 2 (TWO) TIMES DAILY BEFORE A MEAL. 08/03/21 08/03/22  Charlott Rakes, MD  Insulin Glargine (BASAGLAR KWIKPEN) 100 UNIT/ML Inject 32 Units into the skin 2 (two) times daily. 09/21/21   Charlott Rakes, MD  Insulin Pen Needle (TRUEPLUS PEN NEEDLES) 32G X 4 MM MISC USE TO INJECT LANTUS DAILY 03/08/21   Charlott Rakes, MD  lisinopril (ZESTRIL) 20 MG tablet Take 1 tablet (20 mg total) by mouth daily. 08/13/21 08/13/22  Croitoru, Mihai, MD  meloxicam (MOBIC) 7.5 MG tablet Take 1 tablet (7.5 mg total) by mouth daily. 08/03/21   Charlott Rakes, MD  rizatriptan (MAXALT) 5 MG tablet TAKE 1 TABLET (5 MG TOTAL) BY MOUTH AS NEEDED FOR MIGRAINE. MAY REPEAT IN 2 HOURS IF NEEDED 05/13/21 05/13/22  Mayers, Cari S, PA-C  rosuvastatin (CRESTOR) 5 MG tablet Take 1 tablet (5 mg total) by mouth daily. 07/20/21 11/26/21  Terrilee Croak, MD  traMADol (ULTRAM) 50 MG tablet Take 1 tablet (50 mg total) by mouth every 8 (eight) hours as needed (for pain and muscle spasms). 08/13/21   Croitoru, Mihai, MD  traZODone (DESYREL) 100 MG tablet TAKE 1 TABLET (100 MG TOTAL) BY MOUTH AT BEDTIME AS NEEDED FOR SLEEP. 05/13/21 05/13/22  Mayers, Cari S, PA-C  escitalopram (LEXAPRO) 10 MG tablet Take 10 mg by mouth daily.  01/31/12  [provider]    Physical Exam: Constitutional: Moderately built and nourished. Vitals:   11/16/21 1730 11/16/21 1800 11/16/21 1830 11/16/21 1900  BP: (!) 144/75 (!) 143/68 (!) 148/71 (!) 155/82  Pulse: (!) 53 (!) 54 (!) 55 (!) 54  Resp: 14 16 19 17   Temp:       TempSrc:      SpO2: 98% 99% 100% 100%  Weight:      Height:       Eyes: Anicteric no pallor. ENMT: No discharge from the ears eyes nose or mouth. Neck: No mass felt.  No neck rigidity. Respiratory: No rhonchi or crepitations. Cardiovascular: S1-S2 heard. Abdomen: Soft nontender bowel sound present. Musculoskeletal: No edema. Skin: No rash. Neurologic: Alert awake oriented to time place and person.  Moves all extremities. Psychiatric: Appears normal.  Normal affect.   Labs on Admission: I have personally reviewed following labs and imaging studies  CBC: Recent Labs  Lab 11/16/21 1621  WBC 6.0  HGB 14.3  HCT  40.3  MCV 86.5  PLT 263*   Basic Metabolic Panel: Recent Labs  Lab 11/16/21 1621  NA 137  K 3.7  CL 109  CO2 18*  GLUCOSE 167*  BUN 16  CREATININE 0.99  CALCIUM 9.4   GFR: Estimated Creatinine Clearance: 49.4 mL/min (by C-G formula based on SCr of 0.99 mg/dL). Liver Function Tests: No results for input(s): AST, ALT, ALKPHOS, BILITOT, PROT, ALBUMIN in the last 168 hours. No results for input(s): LIPASE, AMYLASE in the last 168 hours. No results for input(s): AMMONIA in the last 168 hours. Coagulation Profile: No results for input(s): INR, PROTIME in the last 168 hours. Cardiac Enzymes: No results for input(s): CKTOTAL, CKMB, CKMBINDEX, TROPONINI in the last 168 hours. BNP (last 3 results) No results for input(s): PROBNP in the last 8760 hours. HbA1C: No results for input(s): HGBA1C in the last 72 hours. CBG: No results for input(s): GLUCAP in the last 168 hours. Lipid Profile: No results for input(s): CHOL, HDL, LDLCALC, TRIG, CHOLHDL, LDLDIRECT in the last 72 hours. Thyroid Function Tests: No results for input(s): TSH, T4TOTAL, FREET4, T3FREE, THYROIDAB in the last 72 hours. Anemia Panel: No results for input(s): VITAMINB12, FOLATE, FERRITIN, TIBC, IRON, RETICCTPCT in the last 72 hours. Urine analysis:    Component Value Date/Time   COLORURINE  YELLOW 07/15/2021 1810   APPEARANCEUR HAZY (A) 07/15/2021 1810   LABSPEC 1.013 07/15/2021 1810   LABSPEC 1.015 10/27/2016 1106   PHURINE 5.0 07/15/2021 1810   GLUCOSEU 150 (A) 07/15/2021 1810   GLUCOSEU 500 10/27/2016 1106   HGBUR NEGATIVE 07/15/2021 1810   BILIRUBINUR NEGATIVE 07/15/2021 1810   BILIRUBINUR Negative 10/27/2016 1106   [place] 07/15/2021 1810   PROTEINUR NEGATIVE 07/15/2021 1810   UROBILINOGEN 0.2 01/08/2018 1602   UROBILINOGEN 0.2 10/27/2016 1106   NITRITE NEGATIVE 07/15/2021 1810   LEUKOCYTESUR LARGE (A) 07/15/2021 1810   LEUKOCYTESUR Small 10/27/2016 1106   Sepsis Labs: @LABRCNTIP (procalcitonin:4,lacticidven:4) )No results found for this or any previous visit (from the past 240 hour(s)).   Radiological Exams on Admission: DG Chest 2 View  Result Date: 11/16/2021 CLINICAL DATA:  Center CP today. SOB and chest heaviness x4 days. Hx of HTN, DM, CVA. EXAM: CHEST - 2 VIEW COMPARISON:  05/30/2021 FINDINGS: Lungs are clear. Heart size and mediastinal contours are within normal limits. No effusion.  No pneumothorax. Visualized bones unremarkable. IMPRESSION: No acute cardiopulmonary disease. Electronically Signed   By: Lucrezia Europe M.D.   On: 11/16/2021 16:47    EKG: Independently reviewed.  Sinus bradycardia with heart rate around 55 bpm.  Assessment/Plan Principal Problem:   Chest pain Active Problems:   HTN (hypertension)   Diabetes mellitus (HCC)   History of breast cancer   NAFLD (nonalcoholic fatty liver disease)    Chest pain and palpitation with shortness of breath happens even at rest most of the symptoms happens with palpitations.  Patient's feels like chest tightness happen when she takes a deep breath.  Will trend cardiac markers check D-dimer TSH closely monitor in telemetry.  Patient previously was treated for possible pericarditis with colchicine.  At this time EKG and symptoms are not clearly indicating pericarditis picture.  We will check  CRP and sed rate. Hypertension on Coreg and lisinopril. Chronic thrombocytopenia could be from cirrhosis. Diabetes mellitus type 2 on glipizide presently on sliding scale coverage.  Patient does take long-acting Lantus insulin when sugars are more than 270. History of CVA and hyperlipidemia on statins and aspirin. Patient admitted for sepsis secondary  to pyonephritis. History of breast cancer status post left-sided lumpectomy and radiation. Prior history of being treated for possible pericarditis with colchicine.   DVT prophylaxis: Lovenox. Code Status: Full code. Family Communication: Patient's daughter. Disposition Plan: Home when stable. Consults called: None. Admission status: Observation.   Rise Patience MD [facility] Pager [phone].  If 7PM-7AM, please contact night-coverage [email or web address] Password [email or web address]  11/16/2021, 8:50 PM

## 2021-11-16 NOTE — ED Notes (Signed)
Pt ambulated to restroom without complication. UA sent down to hold in lab.

## 2021-11-16 NOTE — ED Triage Notes (Signed)
Pt presents with chest pain and SOB x 4 days. States she constantly is catching her breath. Denies cough.

## 2021-11-16 NOTE — ED Triage Notes (Signed)
Pt BIB [facility] from [facility]. Pt c/o chest pain and SHOB x 4 days. CP was relieved by SL nitro x 1. Pt now c/o headache

## 2021-11-16 NOTE — ED Triage Notes (Signed)
States she feel more discomfort breathing in than chest pain.

## 2021-11-16 NOTE — ED Notes (Signed)
Report given to [facility]. 

## 2021-11-16 NOTE — ED Notes (Signed)
Patient is being discharged from the Urgent Care and sent to the Emergency Department via EMS . Per Lowella Petties NP, patient is in need of higher level of care due to chest pain and SOB. Patient is aware and verbalizes understanding of plan of care.  Vitals:   11/16/21 1456 11/16/21 1510  BP: (!) 187/84 (!) 171/84  Pulse: (!) 56   Resp: 17   SpO2: 98%

## 2021-11-16 NOTE — ED Notes (Signed)
Pt ambulated to restroom with daughter's assistance.

## 2021-11-16 NOTE — ED Provider Notes (Signed)
[facility] EMERGENCY DEPARTMENT Provider Note   CSN: [id number] Arrival date & time: 11/16/21  1611     History Chief Complaint  Patient presents with   Chest Pain    Brandi Bryant is a 65 y.o. female.  HPI      Feels heart beating, sensation that heart beats once then hurts, then beats fast, then feels pressure on the chest. When she breaths during that moment feels weak.  For the last 2-3 days has been more frequent Was 2-3 times a day but now is every 5 minutes, then prolonged a little and starts again  When breath deeply feels a relief but also feels weak Had recent zio patch, similar symptoms prior but now feeling pressure on the chest, can't explain it 6/10, was 7/10. Lasts one minute Shortness of breath over the last few days, is new, is worse when laying flat but even with pillows wakes up with the same sensation The palpitations seem to come together with the fatigue, dyspnea, chest pain Has some pain back of upper legs on both sides, tinglin gboth feet No swelling Left ulnar side of hand today Pressure radiates to the right side of neck  Nausea, no vomiting     Past Medical History:  Diagnosis Date   Abdominal pain    Allergy    Arthralgia 08/13/2013   Bell palsy 2006   Bell's palsy    Breast cancer ([place]) 2009   Left Breast Cancer   Cataract    Chest pain 09/30/2013   Cholelithiasis    Chronic sinusitis 05/16/2017   Colon cancer screening 16/07/6788   Complication of anesthesia    " tubal ligation, in [place]"  had weakness, couldnt stand up the next day, was given pills for oxygen for Brain."  1 month after I was having loss of attentiviness, still have to focus  carefully.    Coronary artery disease    CVA (cerebral vascular accident) ([place]) 03/30/2018   Depression    Diabetes mellitus    Type II   Dyspnea    at times- when air conditioner is runnimg, heat also    Encounter for screening colonoscopy 10/30/2015   GERD  (gastroesophageal reflux disease)    Hepatic steatosis    History of breast cancer 06/01/2012   HTN (hypertension)    Hx of adenomatous polyp of colon 11/07/2019   Hyperlipidemia    Internal carotid artery stenosis 04/03/2018   Personal history of chemotherapy 2009   Left Breast Cancer   Personal history of radiation therapy 2009   Left Breast Cancer   Stroke ([place])    2006, 2015   TIA (transient ischemic attack) 02/20/2014    Patient Active Problem List   Diagnosis Date Noted   AKI (acute kidney injury) ([place]) 07/16/2021   Pyelonephritis 07/15/2021   Epigastric pain 06/08/2021   Vitamin D deficiency 05/13/2021   Insomnia 05/13/2021   Other cirrhosis of liver ([place]) 11/14/2019   Kidney lesion, native, left 11/14/2019   Hx of adenomatous polyp of colon 11/07/2019   Carpal tunnel syndrome 11/07/2018   GERD (gastroesophageal reflux disease) 11/27/2017   Diabetic polyneuropathy associated with type 2 diabetes mellitus ([place]) 10/31/2016   Impingement syndrome of right ankle 10/31/2016   Symptomatic cholelithiasis 12/09/2015   Daily headache 02/21/2014   Trigeminal neuralgia 01/22/2014   Weakness 01/22/2014   Bell's palsy 01/21/2014   NAFLD (nonalcoholic fatty liver disease) 01/21/2014   Facial droop 01/15/2014   Stroke ([place]) 01/15/2014  Type 2 diabetes mellitus with diabetic neuropathy ([place]) 12/06/2013   Hyperlipidemia 11/07/2013   CAD (coronary artery disease), native coronary artery 11/07/2013   Chest pain 09/30/2013   History of breast cancer 06/01/2012   HTN (hypertension)    Diabetes mellitus ([place])     Past Surgical History:  Procedure Laterality Date   ANKLE ARTHROSCOPY Right 12/14/2016   Procedure: Right Ankle Arthroscopic Debridement;  Surgeon: Newt Minion, MD;  Location: [facility];  Service: Orthopedics;  Laterality: Right;   Arm surgery     Left tendon lengthen   BREAST LUMPECTOMY Left 2009   CHOLECYSTECTOMY N/A 12/09/2015   Procedure: LAPAROSCOPIC CHOLECYSTECTOMY  WITH INTRAOPERATIVE CHOLANGIOGRAM;  Surgeon: Greer Pickerel, MD;  Location: [facility];  Service: General;  Laterality: N/A;   LEFT HEART CATHETERIZATION WITH CORONARY ANGIOGRAM N/A 10/30/2013   Procedure: LEFT HEART CATHETERIZATION WITH CORONARY ANGIOGRAM;  Surgeon: Josue Hector, MD;  Location: [facility] CATH LAB;  Service: Cardiovascular;  Laterality: N/A;   porta cath Right    Removal of Porta cath Right    TUBAL LIGATION       OB History     Gravida  6   Para  6   Term  6   Preterm      AB      Living  6      SAB      IAB      Ectopic      Multiple      Live Births              Family History  Problem Relation Age of Onset   Thyroid cancer Sister    Hypertension Mother    Diabetes Mother    Migraines Daughter    Colon cancer Neg Hx    Esophageal cancer Neg Hx    Stomach cancer Neg Hx    Rectal cancer Neg Hx     Social History   Tobacco Use   Smoking status: Never   Smokeless tobacco: Never  Vaping Use   Vaping Use: Never used  Substance Use Topics   Alcohol use: No   Drug use: No    Home Medications Prior to Admission medications   Medication Sig Start Date End Date Taking? Authorizing Provider  aspirin 325 MG EC tablet Take 1 tablet (325 mg total) by mouth daily. 06/14/18  Yes McClung, Angela M, PA-C  carvedilol (COREG) 12.5 MG tablet TAKE 1 TABLET (12.5 MG TOTAL) BY MOUTH 2 (TWO) TIMES DAILY WITH A MEAL. Patient taking differently: Take 12.5 mg by mouth 2 (two) times daily with a meal. 05/13/21 05/13/22 Yes Mayers, Cari S, PA-C  cyclobenzaprine (FLEXERIL) 10 MG tablet Take 1 tablet (10 mg total) by mouth every 8 (eight) hours as needed for muscle spasms. 08/13/21  Yes Croitoru, Mihai, MD  glipiZIDE (GLUCOTROL) 10 MG tablet TAKE 1 TABLET (10 MG TOTAL) BY MOUTH 2 (TWO) TIMES DAILY BEFORE A MEAL. Patient taking differently: Take 10 mg by mouth 2 (two) times daily before a meal. 08/03/21 08/03/22 Yes Newlin, Enobong, MD  Insulin Glargine (BASAGLAR KWIKPEN) 100  UNIT/ML Inject 32 Units into the skin 2 (two) times daily. 09/21/21  Yes Newlin, Charlane Ferretti, MD  Insulin Pen Needle (TRUEPLUS PEN NEEDLES) 32G X 4 MM MISC USE TO INJECT LANTUS DAILY Patient taking differently: 1 each by Other route See admin instructions. USE TO INJECT LANTUS DAILY 03/08/21  Yes Charlott Rakes, MD  lisinopril (ZESTRIL) 20 MG tablet Take 1 tablet (20 mg  total) by mouth daily. 08/13/21 08/13/22 Yes Croitoru, Mihai, MD  meloxicam (MOBIC) 7.5 MG tablet Take 1 tablet (7.5 mg total) by mouth daily. 08/03/21  Yes Newlin, Charlane Ferretti, MD  rizatriptan (MAXALT) 5 MG tablet TAKE 1 TABLET (5 MG TOTAL) BY MOUTH AS NEEDED FOR MIGRAINE. MAY REPEAT IN 2 HOURS IF NEEDED Patient taking differently: Take 5 mg by mouth as needed for migraine (May repeat in 2 hours if needed.). 05/13/21 05/13/22 Yes Mayers, Cari S, PA-C  rosuvastatin (CRESTOR) 5 MG tablet Take 1 tablet (5 mg total) by mouth daily. 07/20/21 11/26/21 Yes Dahal, Marlowe Aschoff, MD  traMADol (ULTRAM) 50 MG tablet Take 1 tablet (50 mg total) by mouth every 8 (eight) hours as needed (for pain and muscle spasms). 08/13/21  Yes Croitoru, Mihai, MD  traZODone (DESYREL) 100 MG tablet TAKE 1 TABLET (100 MG TOTAL) BY MOUTH AT BEDTIME AS NEEDED FOR SLEEP. Patient taking differently: Take 100 mg by mouth at bedtime as needed for sleep. 05/13/21 05/13/22 Yes Mayers, Cari S, PA-C  escitalopram (LEXAPRO) 10 MG tablet Take 10 mg by mouth daily.  01/31/12  [provider]    Allergies    Gadolinium derivatives and Iodinated contrast media  Review of Systems   Review of Systems  Constitutional:  Positive for fatigue. Negative for fever.  Respiratory:  Positive for cough (when feeling short of breath) and shortness of breath.   Cardiovascular:  Positive for chest pain. Negative for leg swelling.  Gastrointestinal:  Positive for nausea. Negative for abdominal pain and vomiting.  Musculoskeletal:  Positive for arthralgias.  Neurological:  Positive for light-headedness.  Negative for syncope and headaches.   Physical Exam Updated Vital Signs BP (!) 121/54    Pulse (!) 51    Temp 98.6 F (37 C) (Oral)    Resp 19    Ht 5' (1.524 m)    Wt 68 kg    SpO2 96%    BMI 29.29 kg/m   Physical Exam Vitals and nursing note reviewed.  Constitutional:      General: She is not in acute distress.    Appearance: She is well-developed. She is not diaphoretic.  HENT:     Head: Normocephalic and atraumatic.  Eyes:     Conjunctiva/sclera: Conjunctivae normal.  Cardiovascular:     Rate and Rhythm: Normal rate and regular rhythm.     Heart sounds: Normal heart sounds. No murmur heard.   No friction rub. No gallop.  Pulmonary:     Effort: Pulmonary effort is normal. No respiratory distress.     Breath sounds: Normal breath sounds. No wheezing or rales.  Abdominal:     General: There is no distension.     Palpations: Abdomen is soft.     Tenderness: There is no abdominal tenderness. There is no guarding.  Musculoskeletal:        General: No tenderness.     Cervical back: Normal range of motion.  Skin:    General: Skin is warm and dry.     Findings: No erythema or rash.  Neurological:     Mental Status: She is alert and oriented to person, place, and time.    ED Results / Procedures / Treatments   Labs (all labs ordered are listed, but only abnormal results are displayed) Labs Reviewed  BASIC METABOLIC PANEL - Abnormal; Notable for the following components:      Result Value   CO2 18 (*)    Glucose, Bld 167 (*)    All  other components within normal limits  CBC - Abnormal; Notable for the following components:   Platelets 146 (*)    All other components within normal limits  CBC - Abnormal; Notable for the following components:   Platelets 134 (*)    All other components within normal limits  CBG MONITORING, ED - Abnormal; Notable for the following components:   Glucose-Capillary 221 (*)    All other components within normal limits  CBG MONITORING, ED -  Abnormal; Notable for the following components:   Glucose-Capillary 170 (*)    All other components within normal limits  CBG MONITORING, ED - Abnormal; Notable for the following components:   Glucose-Capillary 181 (*)    All other components within normal limits  RESP PANEL BY RT-PCR (FLU A&B, COVID) ARPGX2  CREATININE, SERUM  TSH  C-REACTIVE PROTEIN  SEDIMENTATION RATE  TROPONIN I (HIGH SENSITIVITY)  TROPONIN I (HIGH SENSITIVITY)  TROPONIN I (HIGH SENSITIVITY)  TROPONIN I (HIGH SENSITIVITY)    EKG EKG Interpretation  Date/Time:  Tuesday November 16 2021 16:13:18 EST Ventricular Rate:  55 PR Interval:  169 QRS Duration: 104 QT Interval:  432 QTC Calculation: 414 R Axis:   47 Text Interpretation: Sinus rhythm No significant change since last tracing Confirmed by Gareth Morgan [phone]) on 11/16/2021 7:09:37 PM  Radiology DG Chest 2 View  Result Date: 11/16/2021 CLINICAL DATA:  Center CP today. SOB and chest heaviness x4 days. Hx of HTN, DM, CVA. EXAM: CHEST - 2 VIEW COMPARISON:  05/30/2021 FINDINGS: Lungs are clear. Heart size and mediastinal contours are within normal limits. No effusion.  No pneumothorax. Visualized bones unremarkable. IMPRESSION: No acute cardiopulmonary disease. Electronically Signed   By: Lucrezia Europe M.D.   On: 11/16/2021 16:47    Procedures Procedures   Medications Ordered in ED Medications  acetaminophen (TYLENOL) tablet 1,000 mg (1,000 mg Oral Given 11/16/21 2347)  aspirin EC tablet 325 mg (325 mg Oral Given 11/17/21 0952)  carvedilol (COREG) tablet 12.5 mg (12.5 mg Oral Given 11/17/21 0824)  lisinopril (ZESTRIL) tablet 20 mg (20 mg Oral Given 11/17/21 0952)  rosuvastatin (CRESTOR) tablet 5 mg (5 mg Oral Given 11/17/21 0952)  traZODone (DESYREL) tablet 50 mg (has no administration in time range)  insulin aspart (novoLOG) injection 0-9 Units (2 Units Subcutaneous Given 11/17/21 0829)  enoxaparin (LOVENOX) injection 40 mg (40 mg Subcutaneous Given  11/16/21 2245)  ondansetron (ZOFRAN) injection 4 mg (4 mg Intravenous Given 11/17/21 0822)  morphine 2 MG/ML injection 2 mg (has no administration in time range)  0.9 %  sodium chloride infusion ( Intravenous New Bag/Given 11/17/21 0958)  aspirin chewable tablet 324 mg (324 mg Oral Given 11/16/21 1842)  hydrocortisone sodium succinate (SOLU-CORTEF) injection 200 mg (200 mg Intravenous Given 11/17/21 0954)  diphenhydrAMINE (BENADRYL) capsule 50 mg ( Oral See Alternative 11/17/21 1216)    Or  diphenhydrAMINE (BENADRYL) injection 50 mg (50 mg Intravenous Given 11/17/21 1216)    ED Course  I have reviewed the triage vital signs and the nursing notes.  Pertinent labs & imaging results that were available during my care of the patient were reviewed by me and considered in my medical decision making (see chart for details).    MDM Rules/Calculators/A&P                         65 year old female with a history of diabetes, hypertension, hyperlipidemia, CVA, carotid artery disease, nonobstructive coronary artery disease in 2014, breast cancer, presents with  concern for chest pain.  EKG completed and shows no ST elevation MI, or signs of pericarditis.  Chest x-ray shows no evidence of pneumonia, pneumothorax, or other acute abnormalities.  Labs show no other clinically significant changes.  Troponin is within normal limits.  Have low suspicion for pulmonary embolus given her shortness of breath, tachycardia, asymmetric leg swelling or pain.  History is most concerning for possible cardiac etiology and she is a high risk heart score given her medical problems.  Possible she could be having symptomatic PVCs, but some episodes are described as lasting longer, will admit for further cardiac evaluation.    Final Clinical Impression(s) / ED Diagnoses Final diagnoses:  Nonspecific chest pain    Rx / DC Orders ED Discharge Orders     None        Gareth Morgan, MD 11/17/21 1218

## 2021-11-16 NOTE — ED Notes (Signed)
Pt already had ASA today.

## 2021-11-17 ENCOUNTER — Observation Stay (HOSPITAL_COMMUNITY): Payer: No Typology Code available for payment source

## 2021-11-17 ENCOUNTER — Inpatient Hospital Stay (HOSPITAL_COMMUNITY): Payer: No Typology Code available for payment source

## 2021-11-17 ENCOUNTER — Observation Stay (HOSPITAL_COMMUNITY): Payer: Self-pay

## 2021-11-17 DIAGNOSIS — R079 Chest pain, unspecified: Secondary | ICD-10-CM

## 2021-11-17 LAB — CBG MONITORING, ED
Glucose-Capillary: 170 mg/dL — ABNORMAL HIGH (ref 70–99)
Glucose-Capillary: 181 mg/dL — ABNORMAL HIGH (ref 70–99)

## 2021-11-17 LAB — GLUCOSE, CAPILLARY
Glucose-Capillary: 288 mg/dL — ABNORMAL HIGH (ref 70–99)
Glucose-Capillary: 249 mg/dL — ABNORMAL HIGH (ref 70–99)

## 2021-11-17 LAB — C-REACTIVE PROTEIN: CRP: 0.6 mg/dL (ref <1.0)

## 2021-11-17 LAB — TROPONIN I (HIGH SENSITIVITY): Troponin I (High Sensitivity): 7 ng/L (ref <18)

## 2021-11-17 LAB — ECHOCARDIOGRAM COMPLETE
AR max vel: 2.53 cm2
AV Area VTI: 2.66 cm2
AV Area mean vel: 2.55 cm2
AV Mean grad: 4 mmHg
AV Peak grad: 8.4 mmHg
Ao pk vel: 1.45 m/s
Area-P 1/2: 2.75 cm2
Height: 60 in
MV VTI: 2.08 cm2
S' Lateral: 2 cm
Weight: 2400 oz

## 2021-11-17 LAB — SEDIMENTATION RATE: Sed Rate: 6 mm/hr (ref 0–22)

## 2021-11-17 MED ORDER — ONDANSETRON HCL 4 MG/2ML IJ SOLN
4.0000 mg | Freq: Four times a day (QID) | INTRAMUSCULAR | Status: DC | PRN
  Administered 2021-11-17: 08:00:00 4 mg via INTRAVENOUS
  Filled 2021-11-17: qty 2

## 2021-11-17 MED ORDER — ONDANSETRON HCL 4 MG/2ML IJ SOLN: 4.0000 mg | Freq: Four times a day (QID) | INTRAMUSCULAR | Status: DC | PRN

## 2021-11-17 MED ORDER — ALUM & MAG HYDROXIDE-SIMETH 200-200-20 MG/5ML PO SUSP: 30.0000 mL | ORAL | Status: DC | PRN

## 2021-11-17 MED ORDER — SODIUM CHLORIDE 0.9 % IV SOLN: INTRAVENOUS | Status: DC

## 2021-11-17 MED ORDER — DIPHENHYDRAMINE HCL 25 MG PO CAPS: 50.0000 mg | ORAL_CAPSULE | Freq: Once | ORAL | Status: AC

## 2021-11-17 MED ORDER — IOHEXOL 300 MG/ML  SOLN
80.0000 mL | Freq: Once | INTRAMUSCULAR | Status: AC | PRN
  Administered 2021-11-17: 15:00:00 80 mL via INTRAVENOUS

## 2021-11-17 MED ORDER — DIPHENHYDRAMINE HCL 50 MG/ML IJ SOLN
50.0000 mg | Freq: Once | INTRAMUSCULAR | Status: AC
  Administered 2021-11-17: 12:00:00 50 mg via INTRAVENOUS
  Filled 2021-11-17: qty 1

## 2021-11-17 MED ORDER — MORPHINE SULFATE (PF) 2 MG/ML IV SOLN
2.0000 mg | INTRAVENOUS | Status: DC | PRN
  Administered 2021-11-18: 08:00:00 2 mg via INTRAVENOUS
  Filled 2021-11-17: qty 1

## 2021-11-17 MED ORDER — HYDROCORTISONE SOD SUC (PF) 250 MG IJ SOLR
200.0000 mg | Freq: Once | INTRAMUSCULAR | Status: AC
  Administered 2021-11-17: 10:00:00 200 mg via INTRAVENOUS
  Filled 2021-11-17: qty 200

## 2021-11-17 NOTE — Progress Notes (Signed)
°  Echocardiogram 2D Echocardiogram has been performed.  Brandi Bryant 11/17/2021, 4:28 PM

## 2021-11-17 NOTE — Care Management (Signed)
°  Transition of Care (TOC) Screening Note   Patient Details  Name: Brandi Bryant Date of Birth: 10-20-1956   Transition of Care [facility]) CM/SW Contact:    Carles Collet, RN Phone Number: 11/17/2021, 4:45 PM    Transition of Care Department [facility]) has reviewed patient and no TOC needs have been identified at this time. We will continue to monitor patient advancement through interdisciplinary progression rounds. If new patient transition needs arise, please place a TOC consult.

## 2021-11-17 NOTE — ED Notes (Signed)
Lunch Ordered °

## 2021-11-17 NOTE — Progress Notes (Signed)
PROGRESS NOTE    Brandi Bryant  WNU:[id number] DOB: 07-03-1956 DOA: 11/16/2021 PCP: Charlott Rakes, MD   Chief Complain: Chest pain, shortness of breath, palpitations  Brief Narrative: Patient is a 65 year old female with history of hypertension, diabetes type 2, CVA, liver cirrhosis, breast cancer status post left-sided lumpectomy/radiation who presented to the emergency department with complaints of abdominal discomfort, chest pain, palpitations for last 2 to 3 days.  Symptoms not related to exertion.  No history of fever chills.  On presentation her chief complaint was mostly chest pain but during my evaluation she complained of abdominal discomfort and denied any chest pain.  EKG showed sinus bradycardia.  Troponin is normal.  Assessment & Plan:   Principal Problem:   Chest pain Active Problems:   HTN (hypertension)   Diabetes mellitus (HCC)   History of breast cancer   NAFLD (nonalcoholic fatty liver disease)   Chest pain/palpitation/shortness of breath: Unclear etiology.  Denies any chest pain during my evaluation.  Troponin is normal.  EKG showed nonspecific changes.  Chest x-ray did not show pneumonia or pleural effusion or any other abnormality. Checking echocardiogram.  Monitor on telemetry. She has history of pericarditis in the past treated with colchicine.  EKG did not show any signs of pericarditis.  CRP, ESR is normal  Abdominal discomfort: New problem.  Abdomen is soft, nondistended and actually nontender on examination.  Checking CT abdomen/pelvis with contrast.  On her last admission, she was managed for pyelonephritis  Hypertension: Blood pressure stable.  On Coreg and lisinopril at home  Chronic thrombocytopenia: Stable.  History of cirrhosis.  We recommend outpatient follow-up with hepatology  Diabetes type 2: Monitor blood sugars.  Takes glipizide, insulin at home.  History of hyperlipidemia/CVA: On statin and aspirin  History of breast cancer:  Currently in remission.  Status post left lumpectomy, radiation              DVT prophylaxis:Lovenox Code Status: Full Family Communication:: Discussed with son on the phone Patient status: Observation  Dispo: The patient is from: Home              Anticipated d/c is to: Home              Anticipated d/c date is: Tomorrow  Consultants: None  Procedures: None  Antimicrobials:  Anti-infectives (From admission, onward)    None       Subjective:  Patient seen and examined at the bedside this morning.  Hemodynamically stable.  She was uncomfortable with complaint of abdominal discomfort, some nausea.  Denies any chest pain .  Abdomen was unremarkable during my evaluation Objective: Vitals:   11/17/21 0815 11/17/21 0954 11/17/21 1100 11/17/21 1115  BP: (!) 158/52 (!) 118/56 (!) 109/42 (!) 114/56  Pulse: 60 (!) 52 (!) 51 (!) 52  Resp: (!) 23 14 (!) 21 19  Temp:      TempSrc:      SpO2: 98% 98% 94% 95%  Weight:      Height:       No intake or output data in the 24 hours ending 11/17/21 1131 Filed Weights   11/16/21 1613  Weight: 68 kg    Examination:  General exam: Mild distress due to abdominal discomfort HEENT: PERRL Respiratory system:  no wheezes or crackles  Cardiovascular system: S1 & S2 heard, RRR.  Sinus bradycardia Gastrointestinal system: Abdomen is nondistended, soft and nontender. Central nervous system: Alert and oriented Extremities: No edema, no clubbing ,no cyanosis Skin: No rashes, no  ulcers,no icterus      Data Reviewed: I have personally reviewed following labs and imaging studies  CBC: Recent Labs  Lab 11/16/21 1621 11/16/21 2050  WBC 6.0 6.1  HGB 14.3 13.7  HCT 40.3 38.7  MCV 86.5 87.4  PLT 146* 704*   Basic Metabolic Panel: Recent Labs  Lab 11/16/21 1621 11/16/21 2050  NA 137  --   K 3.7  --   CL 109  --   CO2 18*  --   GLUCOSE 167*  --   BUN 16  --   CREATININE 0.99 0.80  CALCIUM 9.4  --    GFR: Estimated  Creatinine Clearance: 61.1 mL/min (by C-G formula based on SCr of 0.8 mg/dL). Liver Function Tests: No results for input(s): AST, ALT, ALKPHOS, BILITOT, PROT, ALBUMIN in the last 168 hours. No results for input(s): LIPASE, AMYLASE in the last 168 hours. No results for input(s): AMMONIA in the last 168 hours. Coagulation Profile: No results for input(s): INR, PROTIME in the last 168 hours. Cardiac Enzymes: No results for input(s): CKTOTAL, CKMB, CKMBINDEX, TROPONINI in the last 168 hours. BNP (last 3 results) No results for input(s): PROBNP in the last 8760 hours. HbA1C: No results for input(s): HGBA1C in the last 72 hours. CBG: Recent Labs  Lab 11/16/21 2254 11/17/21 0825  GLUCAP 221* 170*   Lipid Profile: No results for input(s): CHOL, HDL, LDLCALC, TRIG, CHOLHDL, LDLDIRECT in the last 72 hours. Thyroid Function Tests: Recent Labs    11/16/21 2050  TSH 1.956   Anemia Panel: No results for input(s): VITAMINB12, FOLATE, FERRITIN, TIBC, IRON, RETICCTPCT in the last 72 hours. Sepsis Labs: No results for input(s): PROCALCITON, LATICACIDVEN in the last 168 hours.  Recent Results (from the past 240 hour(s))  Resp Panel by RT-PCR (Flu A&B, Covid) Nasopharyngeal Swab     Status: None   Collection Time: 11/16/21  7:55 PM   Specimen: Nasopharyngeal Swab; Nasopharyngeal(NP) swabs in vial transport medium  Result Value Ref Range Status   SARS Coronavirus 2 by RT PCR NEGATIVE NEGATIVE Final    Comment: (NOTE) SARS-CoV-2 target nucleic acids are NOT DETECTED.  The SARS-CoV-2 RNA is generally detectable in upper respiratory specimens during the acute phase of infection. The lowest concentration of SARS-CoV-2 viral copies this assay can detect is 138 copies/mL. A negative result does not preclude SARS-Cov-2 infection and should not be used as the sole basis for treatment or other patient management decisions. A negative result may occur with  improper specimen collection/handling,  submission of specimen other than nasopharyngeal swab, presence of viral mutation(s) within the areas targeted by this assay, and inadequate number of viral copies(<138 copies/mL). A negative result must be combined with clinical observations, patient history, and epidemiological information. The expected result is Negative.  Fact Sheet for Patients:  EntrepreneurPulse.com.au  Fact Sheet for Healthcare Providers:  IncredibleEmployment.be  This test is no t yet approved or cleared by the [place] FDA and  has been authorized for detection and/or diagnosis of SARS-CoV-2 by FDA under an Emergency Use Authorization (EUA). This EUA will remain  in effect (meaning this test can be used) for the duration of the COVID-19 declaration under Section 564(b)(1) of the Act, 21 U.S.C.section 360bbb-3(b)(1), unless the authorization is terminated  or revoked sooner.       Influenza A by PCR NEGATIVE NEGATIVE Final   Influenza B by PCR NEGATIVE NEGATIVE Final    Comment: (NOTE) The Xpert Xpress SARS-CoV-2/FLU/RSV plus assay is intended as an  aid in the diagnosis of influenza from Nasopharyngeal swab specimens and should not be used as a sole basis for treatment. Nasal washings and aspirates are unacceptable for Xpert Xpress SARS-CoV-2/FLU/RSV testing.  Fact Sheet for Patients: EntrepreneurPulse.com.au  Fact Sheet for Healthcare Providers: IncredibleEmployment.be  This test is not yet approved or cleared by the [place] FDA and has been authorized for detection and/or diagnosis of SARS-CoV-2 by FDA under an Emergency Use Authorization (EUA). This EUA will remain in effect (meaning this test can be used) for the duration of the COVID-19 declaration under Section 564(b)(1) of the Act, 21 U.S.C. section 360bbb-3(b)(1), unless the authorization is terminated or revoked.  Performed at [facility], [place] [address], [place], [place] [address]          Radiology Studies: DG Chest 2 View  Result Date: 11/16/2021 CLINICAL DATA:  Center CP today. SOB and chest heaviness x4 days. Hx of HTN, DM, CVA. EXAM: CHEST - 2 VIEW COMPARISON:  05/30/2021 FINDINGS: Lungs are clear. Heart size and mediastinal contours are within normal limits. No effusion.  No pneumothorax. Visualized bones unremarkable. IMPRESSION: No acute cardiopulmonary disease. Electronically Signed   By: Lucrezia Europe M.D.   On: 11/16/2021 16:47        Scheduled Meds:  aspirin  325 mg Oral Daily   carvedilol  12.5 mg Oral BID WC   diphenhydrAMINE  50 mg Oral Once   Or   diphenhydrAMINE  50 mg Intravenous Once   enoxaparin (LOVENOX) injection  40 mg Subcutaneous Q24H   insulin aspart  0-9 Units Subcutaneous TID WC   lisinopril  20 mg Oral Daily   rosuvastatin  5 mg Oral Daily   Continuous Infusions:  sodium chloride 100 mL/hr at 11/17/21 0958     LOS: 0 days    Time spent: 35 mins.More than 50% of that time was spent in counseling and/or coordination of care.      Shelly Coss, MD [facility] P12/28/2022, 11:31 AM

## 2021-11-18 DIAGNOSIS — R0602 Shortness of breath: Secondary | ICD-10-CM

## 2021-11-18 DIAGNOSIS — I10 Essential (primary) hypertension: Secondary | ICD-10-CM

## 2021-11-18 DIAGNOSIS — R0789 Other chest pain: Principal | ICD-10-CM

## 2021-11-18 DIAGNOSIS — R002 Palpitations: Secondary | ICD-10-CM

## 2021-11-18 LAB — GLUCOSE, CAPILLARY
Glucose-Capillary: 110 mg/dL — ABNORMAL HIGH (ref 70–99)
Glucose-Capillary: 141 mg/dL — ABNORMAL HIGH (ref 70–99)
Glucose-Capillary: 123 mg/dL — ABNORMAL HIGH (ref 70–99)

## 2021-11-18 MED ORDER — INSULIN ASPART 100 UNIT/ML IJ SOLN
0.0000 [IU] | Freq: Three times a day (TID) | INTRAMUSCULAR | Status: DC
  Administered 2021-11-18: 12:00:00 1 [IU] via SUBCUTANEOUS

## 2021-11-18 NOTE — Consult Note (Addendum)
Cardiology Consultation:   Patient ID: Brandi Bryant MRN: [id number]; DOB: 1956/08/01  Admit date: 11/16/2021 Date of Consult: 11/18/2021  PCP:  Charlott Rakes, MD   [facility] Providers Cardiologist:  Sanda Klein, MD   {   Patient Profile:   Brandi Bryant is a 65 y.o. female with a hx of HTN, type 2 DM, CVA, liver cirrhosis, breast cancer s/p left-sided lumpectomy/radiation who is being seen 11/18/2021 for the evaluation of chest pain at the request of Dr. Tawanna Solo with internal medicine.  History of Present Illness:   [name] is a 65 y.o. female with a hx of HTN, type 2 DM, CVA, liver cirrhosis, breast cancer s/p left-sided lumpectomy/radiation who is being seen 11/18/2021 for the evaluation of chest pain at the request of Dr. Tawanna Solo with internal medicine.  Patient is followed by Dr. Sallyanne Kuster primarily for history of palpitations. Last ischemic eval was completed on 11/11/16.  Patient underwent a stress test that was normal and low risk. In 2018, she was treated with a long course of colchicine for atypical chest pain that was suspicious for acute pericarditis following a URI. Echo on 03/31/2018 showed LVEF 60-65%, normal wall motions. Patient was last seen by Dr.Croitoru on 08/13/2021 with complaints of palpitations. The patient wore an event monitor for 7 days taht came back without evidence of atrial fibrillation. There were rare PACs and PVCs.   Patient presented to an urgent care on 12/27 complaining of chest pain and shortness of breath that have been going on for 4 days.  Describes the pain more as discomfort while breathing.  Patient was transferred to the [facility] emergency department and admitted.  Labs in the ED showed Na 137, K 3.7, creatinine 0.99, hemoglobin 14.3, hematocrit 40.3, WBC 6.0. HSTN 6>>6>>7>>7. EKG showed normal sinus rhythm without signs of ischemia. CXR showed no acute cardiopulmonary disease. TSH normal. Echo  11/17/21 showed LVEF 60-65%, no LV wall motion abnormalities, grade I diastolic dysfunction.  On interview, patient reports that the chest pain is actually palpitations.  Palpitations occur randomly, even when she is at rest and last for few seconds.  Palpitations are associated with a feeling of not being able to catch her breath that lasts for about a minute.  Patient also becomes lightheaded for about a minute.  Patient has been on telemetry since being admitted, shows normal sinus rhythm with occasional PVCs. Patient reports that she has had symptoms of palpitations while on telemetry. Patient's baseline HR appears to be in the 50s-60s on carvedilol.  Past Medical History:  Diagnosis Date   Abdominal pain    Allergy    Arthralgia 08/13/2013   Bell palsy 2006   Bell's palsy    Breast cancer ([place]) 2009   Left Breast Cancer   Cataract    Chest pain 09/30/2013   Cholelithiasis    Chronic sinusitis 05/16/2017   Colon cancer screening 60/05/3709   Complication of anesthesia    " tubal ligation, in [place]"  had weakness, couldnt stand up the next day, was given pills for oxygen for Brain."  1 month after I was having loss of attentiviness, still have to focus  carefully.    Coronary artery disease    CVA (cerebral vascular accident) ([place]) 03/30/2018   Depression    Diabetes mellitus    Type II   Dyspnea    at times- when air conditioner is runnimg, heat also    Encounter for screening colonoscopy 10/30/2015   GERD (gastroesophageal reflux disease)  Hepatic steatosis    History of breast cancer 06/01/2012   HTN (hypertension)    Hx of adenomatous polyp of colon 11/07/2019   Hyperlipidemia    Internal carotid artery stenosis 04/03/2018   Personal history of chemotherapy 2009   Left Breast Cancer   Personal history of radiation therapy 2009   Left Breast Cancer   Stroke [facility])    2006, 2015   TIA (transient ischemic attack) 02/20/2014    Past Surgical History:  Procedure Laterality  Date   ANKLE ARTHROSCOPY Right 12/14/2016   Procedure: Right Ankle Arthroscopic Debridement;  Surgeon: Newt Minion, MD;  Location: [place];  Service: Orthopedics;  Laterality: Right;   Arm surgery     Left tendon lengthen   BREAST LUMPECTOMY Left 2009   CHOLECYSTECTOMY N/A 12/09/2015   Procedure: LAPAROSCOPIC CHOLECYSTECTOMY WITH INTRAOPERATIVE CHOLANGIOGRAM;  Surgeon: Greer Pickerel, MD;  Location: [place];  Service: General;  Laterality: N/A;   LEFT HEART CATHETERIZATION WITH CORONARY ANGIOGRAM N/A 10/30/2013   Procedure: LEFT HEART CATHETERIZATION WITH CORONARY ANGIOGRAM;  Surgeon: Josue Hector, MD;  Location: [facility] CATH LAB;  Service: Cardiovascular;  Laterality: N/A;   porta cath Right    Removal of Porta cath Right    TUBAL LIGATION       Home Medications:  Prior to Admission medications   Medication Sig Start Date End Date Taking? Authorizing Provider  aspirin 325 MG EC tablet Take 1 tablet (325 mg total) by mouth daily. 06/14/18  Yes McClung, Angela M, PA-C  carvedilol (COREG) 12.5 MG tablet TAKE 1 TABLET (12.5 MG TOTAL) BY MOUTH 2 (TWO) TIMES DAILY WITH A MEAL. Patient taking differently: Take 12.5 mg by mouth 2 (two) times daily with a meal. 05/13/21 05/13/22 Yes Mayers, Cari S, PA-C  cyclobenzaprine (FLEXERIL) 10 MG tablet Take 1 tablet (10 mg total) by mouth every 8 (eight) hours as needed for muscle spasms. 08/13/21  Yes Croitoru, Mihai, MD  glipiZIDE (GLUCOTROL) 10 MG tablet TAKE 1 TABLET (10 MG TOTAL) BY MOUTH 2 (TWO) TIMES DAILY BEFORE A MEAL. Patient taking differently: Take 10 mg by mouth 2 (two) times daily before a meal. 08/03/21 08/03/22 Yes Newlin, Enobong, MD  Insulin Glargine (BASAGLAR KWIKPEN) 100 UNIT/ML Inject 32 Units into the skin 2 (two) times daily. 09/21/21  Yes Newlin, Charlane Ferretti, MD  Insulin Pen Needle (TRUEPLUS PEN NEEDLES) 32G X 4 MM MISC USE TO INJECT LANTUS DAILY Patient taking differently: 1 each by Other route See admin instructions. USE TO INJECT LANTUS DAILY 03/08/21   Yes Charlott Rakes, MD  lisinopril (ZESTRIL) 20 MG tablet Take 1 tablet (20 mg total) by mouth daily. 08/13/21 08/13/22 Yes Croitoru, Mihai, MD  meloxicam (MOBIC) 7.5 MG tablet Take 1 tablet (7.5 mg total) by mouth daily. 08/03/21  Yes Newlin, Charlane Ferretti, MD  rizatriptan (MAXALT) 5 MG tablet TAKE 1 TABLET (5 MG TOTAL) BY MOUTH AS NEEDED FOR MIGRAINE. MAY REPEAT IN 2 HOURS IF NEEDED Patient taking differently: Take 5 mg by mouth as needed for migraine (May repeat in 2 hours if needed.). 05/13/21 05/13/22 Yes Mayers, Cari S, PA-C  rosuvastatin (CRESTOR) 5 MG tablet Take 1 tablet (5 mg total) by mouth daily. 07/20/21 11/26/21 Yes Dahal, Marlowe Aschoff, MD  traMADol (ULTRAM) 50 MG tablet Take 1 tablet (50 mg total) by mouth every 8 (eight) hours as needed (for pain and muscle spasms). 08/13/21  Yes Croitoru, Mihai, MD  traZODone (DESYREL) 100 MG tablet TAKE 1 TABLET (100 MG TOTAL) BY MOUTH AT BEDTIME AS  NEEDED FOR SLEEP. Patient taking differently: Take 100 mg by mouth at bedtime as needed for sleep. 05/13/21 05/13/22 Yes Mayers, Cari S, PA-C  escitalopram (LEXAPRO) 10 MG tablet Take 10 mg by mouth daily.  01/31/12  [provider]    Inpatient Medications: Scheduled Meds:  aspirin  325 mg Oral Daily   carvedilol  12.5 mg Oral BID WC   enoxaparin (LOVENOX) injection  40 mg Subcutaneous Q24H   insulin aspart  0-9 Units Subcutaneous TID AC & HS   lisinopril  20 mg Oral Daily   rosuvastatin  5 mg Oral Daily   Continuous Infusions:  sodium chloride 100 mL/hr at 11/18/21 0610   PRN Meds: acetaminophen, alum & mag hydroxide-simeth, morphine injection, ondansetron (ZOFRAN) IV, traZODone  Allergies:    Allergies  Allergen Reactions   Gadolinium Derivatives Nausea And Vomiting    Code: VOM, Desc: Pt began vomiting 45 sec after MRI contrast injection of Multihance, Onset Date: [id number]     Iodinated Contrast Media Nausea And Vomiting    Social History:   Social History   Socioeconomic History   Marital  status: Single    Spouse name: Not on file   Number of children: 6   Years of education: Not on file   Highest education level: High school graduate  Occupational History   Not on file  Tobacco Use   Smoking status: Never   Smokeless tobacco: Never  Vaping Use   Vaping Use: Never used  Substance and Sexual Activity   Alcohol use: No   Drug use: No   Sexual activity: Not Currently    Birth control/protection: Surgical  Other Topics Concern   Not on file  Social History Narrative   Single with 6 children    lives with her daughter   Right handed   Never smoker no drug use no alcohol   Social Determinants of Radio broadcast assistant Strain: Not on file  Food Insecurity: Not on file  Transportation Needs: Not on file  Physical Activity: Not on file  Stress: Not on file  Social Connections: Not on file  Intimate Partner Violence: Not on file    Family History:    Family History  Problem Relation Age of Onset   Thyroid cancer Sister    Hypertension Mother    Diabetes Mother    Migraines Daughter    Colon cancer Neg Hx    Esophageal cancer Neg Hx    Stomach cancer Neg Hx    Rectal cancer Neg Hx      ROS:  Please see the history of present illness.   All other ROS reviewed and negative.     Physical Exam/Data:   Vitals:   11/17/21 2100 11/17/21 2200 11/17/21 2354 11/18/21 0421  BP:   (!) 107/53 (!) 134/59  Pulse: 65 64 60 60  Resp:   16 16  Temp:   98.2 F (36.8 C) 98 F (36.7 C)  TempSrc:   Oral Oral  SpO2: 93% 94% 97% 98%  Weight:      Height:        Intake/Output Summary (Last 24 hours) at 11/18/2021 1256 Last data filed at 11/17/2021 2250 Gross per 24 hour  Intake 671.92 ml  Output --  Net 671.92 ml   Last 3 Weights 11/16/2021 08/13/2021 07/16/2021  Weight (lbs) 150 lb 148 lb 3.2 oz 156 lb 1.4 oz  Weight (kg) 68.04 kg 67.223 kg 70.8 kg     Body mass  index is 29.29 kg/m.  General:  Well nourished, well developed, in no acute  distress HEENT: normal Neck: no JVD Vascular: Distal pulses 2+ bilaterally Cardiac:  normal S1, S2; regular, bradycardic; no murmur Lungs:  clear to auscultation bilaterally, no wheezing, rhonchi or rales  Abd: soft, nontender, no hepatomegaly  Ext: no edema Musculoskeletal:  No deformities Skin: warm and dry  Neuro:  CNs 2-12 intact, no focal abnormalities noted Psych:  Normal affect   EKG:  The EKG was personally reviewed and demonstrates:  Normal sinus rhythm  Telemetry:  Telemetry was personally reviewed and demonstrates:  Sinus rhythm, heart rate in the 50s and 60s, occasional PVCs    Relevant CV Studies:  Echo 11/17/21   1. Left ventricular ejection fraction, by estimation, is 60 to 65%. The  left ventricle has normal function. The left ventricle has no regional  wall motion abnormalities. There is mild concentric left ventricular  hypertrophy. Left ventricular diastolic  parameters are consistent with Grade I diastolic dysfunction (impaired  relaxation).   2. Right ventricular systolic function is normal. The right ventricular  size is normal. There is normal pulmonary artery systolic pressure.   3. The mitral valve is normal in structure. Trivial mitral valve  regurgitation. No evidence of mitral stenosis.   4. The aortic valve is tricuspid. Aortic valve regurgitation is not  visualized. No aortic stenosis is present.   5. The inferior vena cava is normal in size with greater than 50%  respiratory variability, suggesting right atrial pressure of 3 mmHg.   Laboratory Data:  High Sensitivity Troponin:   Recent Labs  Lab 11/16/21 1621 11/16/21 1834 11/16/21 2050 11/16/21 2250  TROPONINIHS _0 Chemistry Recent Labs  Lab 11/16/21 1621 11/16/21 2050  NA 137  --   K 3.7  --   CL 109  --   CO2 18*  --   GLUCOSE 167*  --   BUN 16  --   CREATININE 0.99 0.80  CALCIUM 9.4  --   GFRNONAA >60 >60  ANIONGAP 10  --     No results for input(s): PROT,  ALBUMIN, AST, ALT, ALKPHOS, BILITOT in the last 168 hours. Lipids No results for input(s): CHOL, TRIG, HDL, LABVLDL, LDLCALC, CHOLHDL in the last 168 hours.  Hematology Recent Labs  Lab 11/16/21 1621 11/16/21 2050  WBC 6.0 6.1  RBC 4.66 4.43  HGB 14.3 13.7  HCT 40.3 38.7  MCV 86.5 87.4  MCH 30.7 30.9  MCHC 35.5 35.4  RDW 12.1 12.3  PLT 146* 134*   Thyroid  Recent Labs  Lab 11/16/21 2050  TSH 1.956    BNPNo results for input(s): BNP, PROBNP in the last 168 hours.  DDimer No results for input(s): DDIMER in the last 168 hours.   Radiology/Studies:  DG Chest 2 View  Result Date: 11/16/2021 CLINICAL DATA:  Center CP today. SOB and chest heaviness x4 days. Hx of HTN, DM, CVA. EXAM: CHEST - 2 VIEW COMPARISON:  05/30/2021 FINDINGS: Lungs are clear. Heart size and mediastinal contours are within normal limits. No effusion.  No pneumothorax. Visualized bones unremarkable. IMPRESSION: No acute cardiopulmonary disease. Electronically Signed   By: Lucrezia Europe M.D.   On: 11/16/2021 16:47   CT ABDOMEN PELVIS W CONTRAST  Result Date: 11/17/2021 CLINICAL DATA:  Abdominal pain EXAM: CT ABDOMEN AND PELVIS WITH CONTRAST TECHNIQUE: Multidetector CT imaging of the abdomen and pelvis was performed using the standard protocol following bolus administration of  intravenous contrast. CONTRAST:  69m OMNIPAQUE IOHEXOL 300 MG/ML  SOLN COMPARISON:  CT abdomen and pelvis 07/15/2021 FINDINGS: Lower chest: No acute abnormality. Hepatobiliary: Liver is diffusely nodular consistent with cirrhosis. No suspicious hepatic mass visualized. Gallbladder is surgically absent. No significant biliary ductal dilatation visualized. Common bile duct measures 9 mm in diameter, similar to previous study. Pancreas: Pancreas and pancreatic duct appear within normal limits. Spleen: Normal in size without focal abnormality. Adrenals/Urinary Tract: Adrenal glands are normal. Kidneys are mildly lobulated with multifocal areas of  cortical scarring and a few subcentimeter cysts. No hydronephrosis or definite nephrolithiasis visualized bilaterally. No enhancing renal mass appreciated. No perinephric fat stranding. Urinary bladder appears within normal limits. Stomach/Bowel: No bowel obstruction, free air or pneumatosis. Severe colonic diverticulosis. No bowel wall edema identified. Appendix is normal. Vascular/Lymphatic: Aortic atherosclerosis. No enlarged abdominal or pelvic lymph nodes. Reproductive: Uterus and bilateral adnexa are unremarkable. Other: No abdominal wall hernia or abnormality. No abdominopelvic ascites. Musculoskeletal: No acute or significant osseous findings. IMPRESSION: 1. No acute process identified in the abdomen or pelvis. 2. Severe colonic diverticulosis. 3. Hepatic cirrhosis. 4. Lobulated kidneys with multifocal areas of cortical scarring. Electronically Signed   By: DOfilia NeasM.D.   On: 11/17/2021 14:45   ECHOCARDIOGRAM COMPLETE  Result Date: 11/17/2021    ECHOCARDIOGRAM REPORT   Patient Name:   Brandi ANTUNESDate of Exam: 11/17/2021 Medical Rec #:  [id number]              Height:       60.0 in Accession #:    [id number]             Weight:       150.0 lb Date of Birth:  [birth year]              BSA:          1.652 m Patient Age:    89years                BP:           144/75 mmHg Patient Gender: F                       HR:           65 bpm. Exam Location:  Inpatient Procedure: 2D Echo, Cardiac Doppler and Color Doppler Indications:    Chest pain  History:        Patient has prior history of Echocardiogram examinations. Risk                 Factors:Hypertension and Diabetes. Hx CVA and breast cancer.  Sonographer:    AClayton LefortRDCS (AE) Referring Phys: [id number] ADHIKARI IMPRESSIONS  1. Left ventricular ejection fraction, by estimation, is 60 to 65%. The left ventricle has normal function. The left ventricle has no regional wall motion abnormalities. There is mild concentric left  ventricular hypertrophy. Left ventricular diastolic parameters are consistent with Grade I diastolic dysfunction (impaired relaxation).  2. Right ventricular systolic function is normal. The right ventricular size is normal. There is normal pulmonary artery systolic pressure.  3. The mitral valve is normal in structure. Trivial mitral valve regurgitation. No evidence of mitral stenosis.  4. The aortic valve is tricuspid. Aortic valve regurgitation is not visualized. No aortic stenosis is present.  5. The inferior vena cava is normal in size with greater than 50% respiratory variability, suggesting right atrial pressure of 3 mmHg.  FINDINGS  Left Ventricle: Left ventricular ejection fraction, by estimation, is 60 to 65%. The left ventricle has normal function. The left ventricle has no regional wall motion abnormalities. The left ventricular internal cavity size was normal in size. There is  mild concentric left ventricular hypertrophy. Left ventricular diastolic parameters are consistent with Grade I diastolic dysfunction (impaired relaxation). Indeterminate filling pressures. Right Ventricle: The right ventricular size is normal. No increase in right ventricular wall thickness. Right ventricular systolic function is normal. There is normal pulmonary artery systolic pressure. The tricuspid regurgitant velocity is 2.10 m/s, and  with an assumed right atrial pressure of 3 mmHg, the estimated right ventricular systolic pressure is 40.9 mmHg. Left Atrium: Left atrial size was normal in size. Right Atrium: Right atrial size was normal in size. Pericardium: There is no evidence of pericardial effusion. Mitral Valve: The mitral valve is normal in structure. Trivial mitral valve regurgitation. No evidence of mitral valve stenosis. MV peak gradient, 5.4 mmHg. The mean mitral valve gradient is 2.0 mmHg. Tricuspid Valve: The tricuspid valve is normal in structure. Tricuspid valve regurgitation is trivial. No evidence of  tricuspid stenosis. Aortic Valve: The aortic valve is tricuspid. Aortic valve regurgitation is not visualized. No aortic stenosis is present. Aortic valve mean gradient measures 4.0 mmHg. Aortic valve peak gradient measures 8.4 mmHg. Aortic valve area, by VTI measures 2.66 cm. Pulmonic Valve: The pulmonic valve was normal in structure. Pulmonic valve regurgitation is not visualized. No evidence of pulmonic stenosis. Aorta: The aortic root is normal in size and structure. Venous: The inferior vena cava is normal in size with greater than 50% respiratory variability, suggesting right atrial pressure of 3 mmHg. IAS/Shunts: No atrial level shunt detected by color flow Doppler.  LEFT VENTRICLE PLAX 2D LVIDd:         3.80 cm   Diastology LVIDs:         2.00 cm   LV e' medial:    7.83 cm/s LV PW:         1.10 cm   LV E/e' medial:  11.1 LV IVS:        1.20 cm   LV e' lateral:   11.70 cm/s LVOT diam:     2.00 cm   LV E/e' lateral: 7.4 LV SV:         87 LV SV Index:   52 LVOT Area:     3.14 cm  RIGHT VENTRICLE             IVC RV Basal diam:  2.90 cm     IVC diam: 1.30 cm RV S prime:     17.00 cm/s TAPSE (M-mode): 2.2 cm LEFT ATRIUM           Index        RIGHT ATRIUM           Index LA diam:      3.50 cm 2.12 cm/m   RA Area:     11.00 cm LA Vol (A2C): 33.4 ml 20.22 ml/m  RA Volume:   22.00 ml  13.32 ml/m LA Vol (A4C): 42.0 ml 25.43 ml/m  AORTIC VALVE AV Area (Vmax):    2.53 cm AV Area (Vmean):   2.55 cm AV Area (VTI):     2.66 cm AV Vmax:           145.00 cm/s AV Vmean:          97.000 cm/s AV VTI:  0.326 m AV Peak Grad:      8.4 mmHg AV Mean Grad:      4.0 mmHg LVOT Vmax:         117.00 cm/s LVOT Vmean:        78.600 cm/s LVOT VTI:          0.276 m LVOT/AV VTI ratio: 0.85  AORTA Ao Root diam: 2.10 cm Ao Asc diam:  3.60 cm MITRAL VALVE                TRICUSPID VALVE MV Area (PHT): 2.75 cm     TR Peak grad:   17.6 mmHg MV Area VTI:   2.08 cm     TR Vmax:        210.00 cm/s MV Peak grad:  5.4 mmHg MV Mean  grad:  2.0 mmHg     SHUNTS MV Vmax:       1.16 m/s     Systemic VTI:  0.28 m MV Vmean:      62.8 cm/s    Systemic Diam: 2.00 cm MV Decel Time: 276 msec MV E velocity: 87.00 cm/s MV A velocity: 124.00 cm/s MV E/A ratio:  0.70 Skeet Latch MD Electronically signed by Skeet Latch MD Signature Date/Time: 11/17/2021/8:56:28 PM    Final      Assessment and Plan:   Palpitations: Patient reports palpitations that happen multiple times a day. Associated with a feeling of not being able to catch her breath. Occur randomly throughout the day  - EKG, telemetry show sinus rhythm - no arrhythmias to correlate with symptoms while on tele - HSTN 6>>6>>7>>7.  - EKG showed normal sinus rhythm without signs of ischemia.  - CXR showed no acute cardiopulmonary disease.  - TSH normal.  - CRP, ESR normal so unlikely to be pericarditis  - Echo 11/17/21 showed LVEF 60-65%, no LV wall motion abnormalities, grade I diastolic dysfunction. - Patient wore a 7 day event monitor in September of this year, showed no rhythm abnormalities and no identifiable cardiac cause for her symptoms  - No objective evidence that patient has any ischemia or cardiac arrhythmias   HTN - Continue outpatient carvedilol 12.5 mg BID, lisinopril 20 mg daily   Chronic Thrombocytopenia: History of cirrhosis -Outpatient follow up with hepatology    HLD, history of CVA  - Continue statin, aspirin   DM - Continue outpatient regiment   We will arrange outpatient follow up  Risk Assessment/Risk Scores:    For questions or updates, please contact [facility] Please consult [email or web address] for contact info under    Signed, Margie Billet, PA-C  11/18/2021 12:56 PM  I have examined the patient and reviewed assessment and plan and discussed with patient.  Agree with above as stated.    Tele reviewed.  Rare PVCs noted.  No sustained arrhtyhmias.  Atypical chest pain and shortness of breath.  Troponins negative.  CXR showed  no active disease.  Echo showed normal LVEF and valvular function. Reassurance provided.  I encouraged her to walk more to increase stamina.  Low salt diet also to help avoid volume overload and to help BP.  Will arrange f/u with our office in 4-6 weeks.   Larae Grooms

## 2021-11-18 NOTE — Discharge Summary (Signed)
Physician Discharge Summary  Brandi Bryant ZOX:[id number] DOB: 1955/11/26 DOA: 11/16/2021  PCP: Charlott Rakes, MD  Admit date: 11/16/2021 Discharge date: 11/18/2021  Admitted From: Home Disposition:  Home  Discharge Condition:Stable CODE STATUS:FULL Diet recommendation: Heart Healthy Brief/Interim Summary: Patient is a 65 year old female with history of hypertension, diabetes type 2, CVA, liver cirrhosis, breast cancer status post left-sided lumpectomy/radiation who presented to the emergency department with complaints of abdominal discomfort, chest pain, palpitations for last 2 to 3 days.  Symptoms not related to exertion.  No history of fever chills.  On presentation her chief complaint was mostly chest pain but during my evaluation she complained of abdominal discomfort and denied any chest pain.  EKG showed sinus bradycardia.  Troponin is normal.Echo showed normal left ventricular function.Cardiology didn't recommend further work up. Medically stable for dc. She will follow up with cardiology as outpatient.  Problems addressed:  Chest pain/palpitation/shortness of breath: Unclear etiology.   Troponin is normal.  EKG showed nonspecific changes.  Chest x-ray did not show pneumonia or pleural effusion or any other abnormality. Nonconcerning echocardiogram with EF of 81-19%,JYNWG 1 diastolic dysfunction.  She has history of pericarditis in the past treated with colchicine.  EKG did not show any signs of pericarditis.  CRP, ESR is normal.No further work up planned   Abdominal discomfort: .  Abdomen is soft, nondistended and actually nontender on examination. No acute finding onCT abdomen/pelvis with contrast.    Hypertension: Blood pressure stable.  On Coreg and lisinopril at home   Chronic thrombocytopenia: Stable.  History of cirrhosis.  We recommend outpatient follow-up with hepatology   Diabetes type 2: Monitor blood sugars.  Takes glipizide, insulin at home.   History of  hyperlipidemia/CVA: On statin and aspirin   History of breast cancer: Currently in remission.  Status post left lumpectomy, radiation        Discharge Diagnoses:  Principal Problem:   Chest pain Active Problems:   HTN (hypertension)   Diabetes mellitus ([place])   History of breast cancer   NAFLD (nonalcoholic fatty liver disease)    Discharge Instructions  Discharge Instructions     Diet - low sodium heart healthy   Complete by: As directed    Discharge instructions   Complete by: As directed    1)Follow up with your PCP in a week 2)You have an appointment with cardiology on Feb 16. 3)Continue taking your home medications   Increase activity slowly   Complete by: As directed       Allergies as of 11/18/2021       Reactions   Gadolinium Derivatives Nausea And Vomiting   Code: VOM, Desc: Pt began vomiting 45 sec after MRI contrast injection of Multihance, Onset Date: [id number]   Iodinated Contrast Media Nausea And Vomiting        Medication List     TAKE these medications    aspirin 325 MG EC tablet Take 1 tablet (325 mg total) by mouth daily.   Basaglar KwikPen 100 UNIT/ML Inject 32 Units into the skin 2 (two) times daily.   carvedilol 12.5 MG tablet Commonly known as: COREG TAKE 1 TABLET (12.5 MG TOTAL) BY MOUTH 2 (TWO) TIMES DAILY WITH A MEAL. What changed: how much to take   cyclobenzaprine 10 MG tablet Commonly known as: FLEXERIL Take 1 tablet (10 mg total) by mouth every 8 (eight) hours as needed for muscle spasms.   glipiZIDE 10 MG tablet Commonly known as: GLUCOTROL TAKE 1 TABLET (10 MG TOTAL) BY MOUTH  2 (TWO) TIMES DAILY BEFORE A MEAL. What changed:  how much to take when to take this   lisinopril 20 MG tablet Commonly known as: ZESTRIL Tome 1 tableta (20 mg en total) por va oral diariamente. (Take 1 tablet (20 mg total) by mouth daily.)   meloxicam 7.5 MG tablet Commonly known as: MOBIC Tome 1 tableta (7.5 mg en total) por va oral  diariamente. (Take 1 tablet (7.5 mg total) by mouth daily.)   rizatriptan 5 MG tablet Commonly known as: MAXALT TAKE 1 TABLET (5 MG TOTAL) BY MOUTH AS NEEDED FOR MIGRAINE. MAY REPEAT IN 2 HOURS IF NEEDED What changed:  how much to take how to take this when to take this reasons to take this   rosuvastatin 5 MG tablet Commonly known as: CRESTOR Tome 1 tableta (5 mg en total) por va oral diariamente. (Take 1 tablet (5 mg total) by mouth daily.)   traMADol 50 MG tablet Commonly known as: ULTRAM Take 1 tablet (50 mg total) by mouth every 8 (eight) hours as needed (for pain and muscle spasms).   traZODone 100 MG tablet Commonly known as: DESYREL TAKE 1 TABLET (100 MG TOTAL) BY MOUTH AT BEDTIME AS NEEDED FOR SLEEP. What changed: how much to take   TRUEplus Pen Needles 32G X 4 MM Misc Generic drug: Insulin Pen Needle USE TO INJECT LANTUS DAILY What changed:  how much to take how to take this when to take this        Follow-up Information     [facility] Follow up.   Specialty: Cardiology Why: [place] - cardiology follow-up with PA Caron Presume on jueves Jan 06, 2022 8:50 AM (Llegar antes de 8:35 AM) Contact information: [address] [place] [phone]        Charlott Rakes, MD. Schedule an appointment as soon as possible for a visit in 1 week(s).   Specialty: Family Medicine Contact information: [place] [address] [phone]                Allergies  Allergen Reactions   Gadolinium Derivatives Nausea And Vomiting    Code: VOM, Desc: Pt began vomiting 45 sec after MRI contrast injection of Multihance, Onset Date: [id number]     Iodinated Contrast Media Nausea And Vomiting    Consultations: Cardiology   Procedures/Studies: DG Chest 2 View  Result Date: 11/16/2021 CLINICAL DATA:  Center CP today. SOB and chest heaviness x4 days. Hx of HTN,  DM, CVA. EXAM: CHEST - 2 VIEW COMPARISON:  05/30/2021 FINDINGS: Lungs are clear. Heart size and mediastinal contours are within normal limits. No effusion.  No pneumothorax. Visualized bones unremarkable. IMPRESSION: No acute cardiopulmonary disease. Electronically Signed   By: Lucrezia Europe M.D.   On: 11/16/2021 16:47   CT ABDOMEN PELVIS W CONTRAST  Result Date: 11/17/2021 CLINICAL DATA:  Abdominal pain EXAM: CT ABDOMEN AND PELVIS WITH CONTRAST TECHNIQUE: Multidetector CT imaging of the abdomen and pelvis was performed using the standard protocol following bolus administration of intravenous contrast. CONTRAST:  52m OMNIPAQUE IOHEXOL 300 MG/ML  SOLN COMPARISON:  CT abdomen and pelvis 07/15/2021 FINDINGS: Lower chest: No acute abnormality. Hepatobiliary: Liver is diffusely nodular consistent with cirrhosis. No suspicious hepatic mass visualized. Gallbladder is surgically absent. No significant biliary ductal dilatation visualized. Common bile duct measures 9 mm in diameter, similar to previous study. Pancreas: Pancreas and pancreatic duct appear within normal limits. Spleen: Normal in size  without focal abnormality. Adrenals/Urinary Tract: Adrenal glands are normal. Kidneys are mildly lobulated with multifocal areas of cortical scarring and a few subcentimeter cysts. No hydronephrosis or definite nephrolithiasis visualized bilaterally. No enhancing renal mass appreciated. No perinephric fat stranding. Urinary bladder appears within normal limits. Stomach/Bowel: No bowel obstruction, free air or pneumatosis. Severe colonic diverticulosis. No bowel wall edema identified. Appendix is normal. Vascular/Lymphatic: Aortic atherosclerosis. No enlarged abdominal or pelvic lymph nodes. Reproductive: Uterus and bilateral adnexa are unremarkable. Other: No abdominal wall hernia or abnormality. No abdominopelvic ascites. Musculoskeletal: No acute or significant osseous findings. IMPRESSION: 1. No acute process identified in  the abdomen or pelvis. 2. Severe colonic diverticulosis. 3. Hepatic cirrhosis. 4. Lobulated kidneys with multifocal areas of cortical scarring. Electronically Signed   By: Ofilia Neas M.D.   On: 11/17/2021 14:45   ECHOCARDIOGRAM COMPLETE  Result Date: 11/17/2021    ECHOCARDIOGRAM REPORT   Patient Name:   Brandi Bryant Date of Exam: 11/17/2021 Medical Rec #:  [id number]               Height:       60.0 in Accession #:    [id number]              Weight:       150.0 lb Date of Birth:  27-Nov-1955               BSA:          1.652 m Patient Age:    42 years                BP:           144/75 mmHg Patient Gender: F                       HR:           65 bpm. Exam Location:  Inpatient Procedure: 2D Echo, Cardiac Doppler and Color Doppler Indications:    Chest pain  History:        Patient has prior history of Echocardiogram examinations. Risk                 Factors:Hypertension and Diabetes. Hx CVA and breast cancer.  Sonographer:    Clayton Lefort RDCS (AE) Referring Phys: [id number] Darvin Dials IMPRESSIONS  1. Left ventricular ejection fraction, by estimation, is 60 to 65%. The left ventricle has normal function. The left ventricle has no regional wall motion abnormalities. There is mild concentric left ventricular hypertrophy. Left ventricular diastolic parameters are consistent with Grade I diastolic dysfunction (impaired relaxation).  2. Right ventricular systolic function is normal. The right ventricular size is normal. There is normal pulmonary artery systolic pressure.  3. The mitral valve is normal in structure. Trivial mitral valve regurgitation. No evidence of mitral stenosis.  4. The aortic valve is tricuspid. Aortic valve regurgitation is not visualized. No aortic stenosis is present.  5. The inferior vena cava is normal in size with greater than 50% respiratory variability, suggesting right atrial pressure of 3 mmHg. FINDINGS  Left Ventricle: Left ventricular ejection fraction, by  estimation, is 60 to 65%. The left ventricle has normal function. The left ventricle has no regional wall motion abnormalities. The left ventricular internal cavity size was normal in size. There is  mild concentric left ventricular hypertrophy. Left ventricular diastolic parameters are consistent with Grade I diastolic dysfunction (impaired relaxation). Indeterminate filling pressures. Right Ventricle: The right ventricular size is normal. No  increase in right ventricular wall thickness. Right ventricular systolic function is normal. There is normal pulmonary artery systolic pressure. The tricuspid regurgitant velocity is 2.10 m/s, and  with an assumed right atrial pressure of 3 mmHg, the estimated right ventricular systolic pressure is 96.2 mmHg. Left Atrium: Left atrial size was normal in size. Right Atrium: Right atrial size was normal in size. Pericardium: There is no evidence of pericardial effusion. Mitral Valve: The mitral valve is normal in structure. Trivial mitral valve regurgitation. No evidence of mitral valve stenosis. MV peak gradient, 5.4 mmHg. The mean mitral valve gradient is 2.0 mmHg. Tricuspid Valve: The tricuspid valve is normal in structure. Tricuspid valve regurgitation is trivial. No evidence of tricuspid stenosis. Aortic Valve: The aortic valve is tricuspid. Aortic valve regurgitation is not visualized. No aortic stenosis is present. Aortic valve mean gradient measures 4.0 mmHg. Aortic valve peak gradient measures 8.4 mmHg. Aortic valve area, by VTI measures 2.66 cm. Pulmonic Valve: The pulmonic valve was normal in structure. Pulmonic valve regurgitation is not visualized. No evidence of pulmonic stenosis. Aorta: The aortic root is normal in size and structure. Venous: The inferior vena cava is normal in size with greater than 50% respiratory variability, suggesting right atrial pressure of 3 mmHg. IAS/Shunts: No atrial level shunt detected by color flow Doppler.  LEFT VENTRICLE PLAX 2D  LVIDd:         3.80 cm   Diastology LVIDs:         2.00 cm   LV e' medial:    7.83 cm/s LV PW:         1.10 cm   LV E/e' medial:  11.1 LV IVS:        1.20 cm   LV e' lateral:   11.70 cm/s LVOT diam:     2.00 cm   LV E/e' lateral: 7.4 LV SV:         87 LV SV Index:   52 LVOT Area:     3.14 cm  RIGHT VENTRICLE             IVC RV Basal diam:  2.90 cm     IVC diam: 1.30 cm RV S prime:     17.00 cm/s TAPSE (M-mode): 2.2 cm LEFT ATRIUM           Index        RIGHT ATRIUM           Index LA diam:      3.50 cm 2.12 cm/m   RA Area:     11.00 cm LA Vol (A2C): 33.4 ml 20.22 ml/m  RA Volume:   22.00 ml  13.32 ml/m LA Vol (A4C): 42.0 ml 25.43 ml/m  AORTIC VALVE AV Area (Vmax):    2.53 cm AV Area (Vmean):   2.55 cm AV Area (VTI):     2.66 cm AV Vmax:           145.00 cm/s AV Vmean:          97.000 cm/s AV VTI:            0.326 m AV Peak Grad:      8.4 mmHg AV Mean Grad:      4.0 mmHg LVOT Vmax:         117.00 cm/s LVOT Vmean:        78.600 cm/s LVOT VTI:          0.276 m LVOT/AV VTI ratio: 0.85  AORTA Ao Root diam: 2.10  cm Ao Asc diam:  3.60 cm MITRAL VALVE                TRICUSPID VALVE MV Area (PHT): 2.75 cm     TR Peak grad:   17.6 mmHg MV Area VTI:   2.08 cm     TR Vmax:        210.00 cm/s MV Peak grad:  5.4 mmHg MV Mean grad:  2.0 mmHg     SHUNTS MV Vmax:       1.16 m/s     Systemic VTI:  0.28 m MV Vmean:      62.8 cm/s    Systemic Diam: 2.00 cm MV Decel Time: 276 msec MV E velocity: 87.00 cm/s MV A velocity: 124.00 cm/s MV E/A ratio:  0.70 Skeet Latch MD Electronically signed by Skeet Latch MD Signature Date/Time: 11/17/2021/8:56:28 PM    Final       Subjective:  Patient seen and examined at bedside this mrng.Medically stable for dc today to home. I called the son this mrng for our plan,again called this afternoon.cudnt reach,left message Discharge Exam: Vitals:   11/17/21 2354 11/18/21 0421  BP: (!) 107/53 (!) 134/59  Pulse: 60 60  Resp: 16 16  Temp: 98.2 F (36.8 C) 98 F (36.7 C)   SpO2: 97% 98%   Vitals:   11/17/21 2100 11/17/21 2200 11/17/21 2354 11/18/21 0421  BP:   (!) 107/53 (!) 134/59  Pulse: 65 64 60 60  Resp:   16 16  Temp:   98.2 F (36.8 C) 98 F (36.7 C)  TempSrc:   Oral Oral  SpO2: 93% 94% 97% 98%  Weight:      Height:        General: Pt is alert, awake, not in acute distress Cardiovascular: RRR, S1/S2 +, no rubs, no gallops Respiratory: CTA bilaterally, no wheezing, no rhonchi Abdominal: Soft, NT, ND, bowel sounds + Extremities: no edema, no cyanosis    The results of significant diagnostics from this hospitalization (including imaging, microbiology, ancillary and laboratory) are listed below for reference.     Microbiology: Recent Results (from the past 240 hour(s))  Resp Panel by RT-PCR (Flu A&B, Covid) Nasopharyngeal Swab     Status: None   Collection Time: 11/16/21  7:55 PM   Specimen: Nasopharyngeal Swab; Nasopharyngeal(NP) swabs in vial transport medium  Result Value Ref Range Status   SARS Coronavirus 2 by RT PCR NEGATIVE NEGATIVE Final    Comment: (NOTE) SARS-CoV-2 target nucleic acids are NOT DETECTED.  The SARS-CoV-2 RNA is generally detectable in upper respiratory specimens during the acute phase of infection. The lowest concentration of SARS-CoV-2 viral copies this assay can detect is 138 copies/mL. A negative result does not preclude SARS-Cov-2 infection and should not be used as the sole basis for treatment or other patient management decisions. A negative result may occur with  improper specimen collection/handling, submission of specimen other than nasopharyngeal swab, presence of viral mutation(s) within the areas targeted by this assay, and inadequate number of viral copies(<138 copies/mL). A negative result must be combined with clinical observations, patient history, and epidemiological information. The expected result is Negative.  Fact Sheet for Patients:  EntrepreneurPulse.com.au  Fact  Sheet for Healthcare Providers:  IncredibleEmployment.be  This test is no t yet approved or cleared by the [place] FDA and  has been authorized for detection and/or diagnosis of SARS-CoV-2 by FDA under an Emergency Use Authorization (EUA). This EUA will remain  in effect (meaning  this test can be used) for the duration of the COVID-19 declaration under Section 564(b)(1) of the Act, 21 U.S.C.section 360bbb-3(b)(1), unless the authorization is terminated  or revoked sooner.       Influenza A by PCR NEGATIVE NEGATIVE Final   Influenza B by PCR NEGATIVE NEGATIVE Final    Comment: (NOTE) The Xpert Xpress SARS-CoV-2/FLU/RSV plus assay is intended as an aid in the diagnosis of influenza from Nasopharyngeal swab specimens and should not be used as a sole basis for treatment. Nasal washings and aspirates are unacceptable for Xpert Xpress SARS-CoV-2/FLU/RSV testing.  Fact Sheet for Patients: EntrepreneurPulse.com.au  Fact Sheet for Healthcare Providers: IncredibleEmployment.be  This test is not yet approved or cleared by the [place] FDA and has been authorized for detection and/or diagnosis of SARS-CoV-2 by FDA under an Emergency Use Authorization (EUA). This EUA will remain in effect (meaning this test can be used) for the duration of the COVID-19 declaration under Section 564(b)(1) of the Act, 21 U.S.C. section 360bbb-3(b)(1), unless the authorization is terminated or revoked.  Performed at [facility], [place] [address], [place], [place] [address]      Labs: BNP (last 3 results) No results for input(s): BNP in the last 8760 hours. Basic Metabolic Panel: Recent Labs  Lab 11/16/21 1621 11/16/21 2050  NA 137  --   K 3.7  --   CL 109  --   CO2 18*  --   GLUCOSE 167*  --   BUN 16  --   CREATININE 0.99 0.80  CALCIUM 9.4  --    Liver Function Tests: No results for input(s): AST, ALT, ALKPHOS, BILITOT,  PROT, ALBUMIN in the last 168 hours. No results for input(s): LIPASE, AMYLASE in the last 168 hours. No results for input(s): AMMONIA in the last 168 hours. CBC: Recent Labs  Lab 11/16/21 1621 11/16/21 2050  WBC 6.0 6.1  HGB 14.3 13.7  HCT 40.3 38.7  MCV 86.5 87.4  PLT 146* 134*   Cardiac Enzymes: No results for input(s): CKTOTAL, CKMB, CKMBINDEX, TROPONINI in the last 168 hours. BNP: Invalid input(s): POCBNP CBG: Recent Labs  Lab 11/17/21 1643 11/17/21 2146 11/18/21 0343 11/18/21 0615 11/18/21 1134  GLUCAP 288* 249* 110* 141* 123*   D-Dimer No results for input(s): DDIMER in the last 72 hours. Hgb A1c No results for input(s): HGBA1C in the last 72 hours. Lipid Profile No results for input(s): CHOL, HDL, LDLCALC, TRIG, CHOLHDL, LDLDIRECT in the last 72 hours. Thyroid function studies Recent Labs    11/16/21 2050  TSH 1.956   Anemia work up No results for input(s): VITAMINB12, FOLATE, FERRITIN, TIBC, IRON, RETICCTPCT in the last 72 hours. Urinalysis    Component Value Date/Time   COLORURINE YELLOW 07/15/2021 1810   APPEARANCEUR HAZY (A) 07/15/2021 1810   LABSPEC 1.013 07/15/2021 1810   LABSPEC 1.015 10/27/2016 1106   PHURINE 5.0 07/15/2021 1810   GLUCOSEU 150 (A) 07/15/2021 1810   GLUCOSEU 500 10/27/2016 1106   HGBUR NEGATIVE 07/15/2021 1810   BILIRUBINUR NEGATIVE 07/15/2021 1810   BILIRUBINUR Negative 10/27/2016 1106   [place] 07/15/2021 1810   PROTEINUR NEGATIVE 07/15/2021 1810   UROBILINOGEN 0.2 01/08/2018 1602   UROBILINOGEN 0.2 10/27/2016 1106   NITRITE NEGATIVE 07/15/2021 1810   LEUKOCYTESUR LARGE (A) 07/15/2021 1810   LEUKOCYTESUR Small 10/27/2016 1106   Sepsis Labs Invalid input(s): PROCALCITONIN,  WBC,  LACTICIDVEN Microbiology Recent Results (from the past 240 hour(s))  Resp Panel by RT-PCR (Flu A&B, Covid) Nasopharyngeal  Swab     Status: None   Collection Time: 11/16/21  7:55 PM   Specimen: Nasopharyngeal Swab;  Nasopharyngeal(NP) swabs in vial transport medium  Result Value Ref Range Status   SARS Coronavirus 2 by RT PCR NEGATIVE NEGATIVE Final    Comment: (NOTE) SARS-CoV-2 target nucleic acids are NOT DETECTED.  The SARS-CoV-2 RNA is generally detectable in upper respiratory specimens during the acute phase of infection. The lowest concentration of SARS-CoV-2 viral copies this assay can detect is 138 copies/mL. A negative result does not preclude SARS-Cov-2 infection and should not be used as the sole basis for treatment or other patient management decisions. A negative result may occur with  improper specimen collection/handling, submission of specimen other than nasopharyngeal swab, presence of viral mutation(s) within the areas targeted by this assay, and inadequate number of viral copies(<138 copies/mL). A negative result must be combined with clinical observations, patient history, and epidemiological information. The expected result is Negative.  Fact Sheet for Patients:  EntrepreneurPulse.com.au  Fact Sheet for Healthcare Providers:  IncredibleEmployment.be  This test is no t yet approved or cleared by the [place] FDA and  has been authorized for detection and/or diagnosis of SARS-CoV-2 by FDA under an Emergency Use Authorization (EUA). This EUA will remain  in effect (meaning this test can be used) for the duration of the COVID-19 declaration under Section 564(b)(1) of the Act, 21 U.S.C.section 360bbb-3(b)(1), unless the authorization is terminated  or revoked sooner.       Influenza A by PCR NEGATIVE NEGATIVE Final   Influenza B by PCR NEGATIVE NEGATIVE Final    Comment: (NOTE) The Xpert Xpress SARS-CoV-2/FLU/RSV plus assay is intended as an aid in the diagnosis of influenza from Nasopharyngeal swab specimens and should not be used as a sole basis for treatment. Nasal washings and aspirates are unacceptable for Xpert Xpress  SARS-CoV-2/FLU/RSV testing.  Fact Sheet for Patients: EntrepreneurPulse.com.au  Fact Sheet for Healthcare Providers: IncredibleEmployment.be  This test is not yet approved or cleared by the [place] FDA and has been authorized for detection and/or diagnosis of SARS-CoV-2 by FDA under an Emergency Use Authorization (EUA). This EUA will remain in effect (meaning this test can be used) for the duration of the COVID-19 declaration under Section 564(b)(1) of the Act, 21 U.S.C. section 360bbb-3(b)(1), unless the authorization is terminated or revoked.  Performed at [facility], [place] [address], [place], [place] [address]     Please note: You were cared for by a hospitalist during your hospital stay. Once you are discharged, your primary care physician will handle any further medical issues. Please note that NO REFILLS for any discharge medications will be authorized once you are discharged, as it is imperative that you return to your primary care physician (or establish a relationship with a primary care physician if you do not have one) for your post hospital discharge needs so that they can reassess your need for medications and monitor your lab values.    Time coordinating discharge: 40 minutes  SIGNED:   Shelly Coss, MD  [facility] 11/18/2021, 4:02 PM Pager [phone]  If 7PM-7AM, please contact night-coverage [email or web address] Password [email or web address]

## 2021-11-18 NOTE — ED Provider Notes (Signed)
[facility]    CSN: [id number] Arrival date & time: 11/16/21  1346      History   Chief Complaint Chief Complaint  Patient presents with   Chest Pain   Shortness of Breath    HPI Brandi Bryant is a 65 y.o. female.   Patient presents with chest pain, sensation of palpitations and tachycardia with associated shortness of breath for 4 days.  Symptoms have increased in frequency and severity.  Describes chest pain as a discomfort with breathing.  Endorses difficulty being able to catch her breath.  Endorses left sided hand numbness and tingling that began while waiting in lobby.  History of hypertension, diabetes, stroke, CAD, polyneuropathy.  Past Medical History:  Diagnosis Date   Abdominal pain    Allergy    Arthralgia 08/13/2013   Bell palsy 2006   Bell's palsy    Breast cancer ([place]) 2009   Left Breast Cancer   Cataract    Chest pain 09/30/2013   Cholelithiasis    Chronic sinusitis 05/16/2017   Colon cancer screening 42/05/622   Complication of anesthesia    " tubal ligation, in [place]"  had weakness, couldnt stand up the next day, was given pills for oxygen for Brain."  1 month after I was having loss of attentiviness, still have to focus  carefully.    Coronary artery disease    CVA (cerebral vascular accident) ([place]) 03/30/2018   Depression    Diabetes mellitus    Type II   Dyspnea    at times- when air conditioner is runnimg, heat also    Encounter for screening colonoscopy 10/30/2015   GERD (gastroesophageal reflux disease)    Hepatic steatosis    History of breast cancer 06/01/2012   HTN (hypertension)    Hx of adenomatous polyp of colon 11/07/2019   Hyperlipidemia    Internal carotid artery stenosis 04/03/2018   Personal history of chemotherapy 2009   Left Breast Cancer   Personal history of radiation therapy 2009   Left Breast Cancer   Stroke ([place])    2006, 2015   TIA (transient ischemic attack) 02/20/2014    Patient Active Problem  List   Diagnosis Date Noted   AKI (acute kidney injury) ([place]) 07/16/2021   Pyelonephritis 07/15/2021   Epigastric pain 06/08/2021   Vitamin D deficiency 05/13/2021   Insomnia 05/13/2021   Other cirrhosis of liver ([place]) 11/14/2019   Kidney lesion, native, left 11/14/2019   Hx of adenomatous polyp of colon 11/07/2019   Carpal tunnel syndrome 11/07/2018   GERD (gastroesophageal reflux disease) 11/27/2017   Diabetic polyneuropathy associated with type 2 diabetes mellitus ([place]) 10/31/2016   Impingement syndrome of right ankle 10/31/2016   Symptomatic cholelithiasis 12/09/2015   Daily headache 02/21/2014   Trigeminal neuralgia 01/22/2014   Weakness 01/22/2014   Bell's palsy 01/21/2014   NAFLD (nonalcoholic fatty liver disease) 01/21/2014   Facial droop 01/15/2014   Stroke ([place]) 01/15/2014   Type 2 diabetes mellitus with diabetic neuropathy ([place]) 12/06/2013   Hyperlipidemia 11/07/2013   CAD (coronary artery disease), native coronary artery 11/07/2013   Chest pain 09/30/2013   History of breast cancer 06/01/2012   HTN (hypertension)    Diabetes mellitus ([place])     Past Surgical History:  Procedure Laterality Date   ANKLE ARTHROSCOPY Right 12/14/2016   Procedure: Right Ankle Arthroscopic Debridement;  Surgeon: Newt Minion, MD;  Location: [facility];  Service: Orthopedics;  Laterality: Right;   Arm surgery     Left  tendon lengthen   BREAST LUMPECTOMY Left 2009   CHOLECYSTECTOMY N/A 12/09/2015   Procedure: LAPAROSCOPIC CHOLECYSTECTOMY WITH INTRAOPERATIVE CHOLANGIOGRAM;  Surgeon: Greer Pickerel, MD;  Location: [facility];  Service: General;  Laterality: N/A;   LEFT HEART CATHETERIZATION WITH CORONARY ANGIOGRAM N/A 10/30/2013   Procedure: LEFT HEART CATHETERIZATION WITH CORONARY ANGIOGRAM;  Surgeon: Josue Hector, MD;  Location: [facility] CATH LAB;  Service: Cardiovascular;  Laterality: N/A;   porta cath Right    Removal of Porta cath Right    TUBAL LIGATION      OB History     Gravida  6   Para   6   Term  6   Preterm      AB      Living  6      SAB      IAB      Ectopic      Multiple      Live Births               Home Medications    Prior to Admission medications   Medication Sig Start Date End Date Taking? Authorizing Provider  aspirin 325 MG EC tablet Take 1 tablet (325 mg total) by mouth daily. 06/14/18   Argentina Donovan, PA-C  carvedilol (COREG) 12.5 MG tablet TAKE 1 TABLET (12.5 MG TOTAL) BY MOUTH 2 (TWO) TIMES DAILY WITH A MEAL. Patient taking differently: Take 12.5 mg by mouth 2 (two) times daily with a meal. 05/13/21 05/13/22  Mayers, Cari S, PA-C  cyclobenzaprine (FLEXERIL) 10 MG tablet Take 1 tablet (10 mg total) by mouth every 8 (eight) hours as needed for muscle spasms. 08/13/21   Croitoru, Mihai, MD  glipiZIDE (GLUCOTROL) 10 MG tablet TAKE 1 TABLET (10 MG TOTAL) BY MOUTH 2 (TWO) TIMES DAILY BEFORE A MEAL. Patient taking differently: Take 10 mg by mouth 2 (two) times daily before a meal. 08/03/21 08/03/22  Charlott Rakes, MD  Insulin Glargine (BASAGLAR KWIKPEN) 100 UNIT/ML Inject 32 Units into the skin 2 (two) times daily. 09/21/21   Charlott Rakes, MD  Insulin Pen Needle (TRUEPLUS PEN NEEDLES) 32G X 4 MM MISC USE TO INJECT LANTUS DAILY Patient taking differently: 1 each by Other route See admin instructions. USE TO INJECT LANTUS DAILY 03/08/21   Charlott Rakes, MD  lisinopril (ZESTRIL) 20 MG tablet Take 1 tablet (20 mg total) by mouth daily. 08/13/21 08/13/22  Croitoru, Mihai, MD  meloxicam (MOBIC) 7.5 MG tablet Take 1 tablet (7.5 mg total) by mouth daily. 08/03/21   Charlott Rakes, MD  rizatriptan (MAXALT) 5 MG tablet TAKE 1 TABLET (5 MG TOTAL) BY MOUTH AS NEEDED FOR MIGRAINE. MAY REPEAT IN 2 HOURS IF NEEDED Patient taking differently: Take 5 mg by mouth as needed for migraine (May repeat in 2 hours if needed.). 05/13/21 05/13/22  Mayers, Cari S, PA-C  rosuvastatin (CRESTOR) 5 MG tablet Take 1 tablet (5 mg total) by mouth daily. 07/20/21 11/26/21  Terrilee Croak, MD  traMADol (ULTRAM) 50 MG tablet Take 1 tablet (50 mg total) by mouth every 8 (eight) hours as needed (for pain and muscle spasms). 08/13/21   Croitoru, Mihai, MD  traZODone (DESYREL) 100 MG tablet TAKE 1 TABLET (100 MG TOTAL) BY MOUTH AT BEDTIME AS NEEDED FOR SLEEP. Patient taking differently: Take 100 mg by mouth at bedtime as needed for sleep. 05/13/21 05/13/22  Mayers, Cari S, PA-C  escitalopram (LEXAPRO) 10 MG tablet Take 10 mg by mouth daily.  01/31/12  [provider]  Family History Family History  Problem Relation Age of Onset   Thyroid cancer Sister    Hypertension Mother    Diabetes Mother    Migraines Daughter    Colon cancer Neg Hx    Esophageal cancer Neg Hx    Stomach cancer Neg Hx    Rectal cancer Neg Hx     Social History Social History   Tobacco Use   Smoking status: Never   Smokeless tobacco: Never  Vaping Use   Vaping Use: Never used  Substance Use Topics   Alcohol use: No   Drug use: No     Allergies   Gadolinium derivatives and Iodinated contrast media   Review of Systems Review of Systems  Respiratory:  Positive for shortness of breath.   Cardiovascular:  Positive for chest pain.    Physical Exam Triage Vital Signs ED Triage Vitals  Enc Vitals Group     BP 11/16/21 1456 (!) 187/84     Pulse Rate 11/16/21 1456 (!) 56     Resp 11/16/21 1456 17     Temp --      Temp Source 11/16/21 1456 Oral     SpO2 11/16/21 1456 98 %     Weight --      Height --      Head Circumference --      Peak Flow --      Pain Score 11/16/21 1454 0     Pain Loc --      Pain Edu? --      Excl. in [place]? --    No data found.  Updated Vital Signs BP (!) 171/84    Pulse (!) 56    Resp 17    SpO2 98%   Visual Acuity Right Eye Distance:   Left Eye Distance:   Bilateral Distance:    Right Eye Near:   Left Eye Near:    Bilateral Near:     Physical Exam   UC Treatments / Results  Labs (all labs ordered are listed, but only abnormal  results are displayed) Labs Reviewed - No data to display  EKG   Radiology DG Chest 2 View  Result Date: 11/16/2021 CLINICAL DATA:  Center [facility] today. SOB and chest heaviness x4 days. Hx of HTN, DM, CVA. EXAM: CHEST - 2 VIEW COMPARISON:  05/30/2021 FINDINGS: Lungs are clear. Heart size and mediastinal contours are within normal limits. No effusion.  No pneumothorax. Visualized bones unremarkable. IMPRESSION: No acute cardiopulmonary disease. Electronically Signed   By: Lucrezia Europe M.D.   On: 11/16/2021 16:47   CT ABDOMEN PELVIS W CONTRAST  Result Date: 11/17/2021 CLINICAL DATA:  Abdominal pain EXAM: CT ABDOMEN AND PELVIS WITH CONTRAST TECHNIQUE: Multidetector CT imaging of the abdomen and pelvis was performed using the standard protocol following bolus administration of intravenous contrast. CONTRAST:  18mL OMNIPAQUE IOHEXOL 300 MG/ML  SOLN COMPARISON:  CT abdomen and pelvis 07/15/2021 FINDINGS: Lower chest: No acute abnormality. Hepatobiliary: Liver is diffusely nodular consistent with cirrhosis. No suspicious hepatic mass visualized. Gallbladder is surgically absent. No significant biliary ductal dilatation visualized. Common bile duct measures 9 mm in diameter, similar to previous study. Pancreas: Pancreas and pancreatic duct appear within normal limits. Spleen: Normal in size without focal abnormality. Adrenals/Urinary Tract: Adrenal glands are normal. Kidneys are mildly lobulated with multifocal areas of cortical scarring and a few subcentimeter cysts. No hydronephrosis or definite nephrolithiasis visualized bilaterally. No enhancing renal mass appreciated. No perinephric fat stranding. Urinary bladder appears within  normal limits. Stomach/Bowel: No bowel obstruction, free air or pneumatosis. Severe colonic diverticulosis. No bowel wall edema identified. Appendix is normal. Vascular/Lymphatic: Aortic atherosclerosis. No enlarged abdominal or pelvic lymph nodes. Reproductive: Uterus and bilateral  adnexa are unremarkable. Other: No abdominal wall hernia or abnormality. No abdominopelvic ascites. Musculoskeletal: No acute or significant osseous findings. IMPRESSION: 1. No acute process identified in the abdomen or pelvis. 2. Severe colonic diverticulosis. 3. Hepatic cirrhosis. 4. Lobulated kidneys with multifocal areas of cortical scarring. Electronically Signed   By: Ofilia Neas M.D.   On: 11/17/2021 14:45   ECHOCARDIOGRAM COMPLETE  Result Date: 11/17/2021    ECHOCARDIOGRAM REPORT   Patient Name:   JILLANE PO Date of Exam: 11/17/2021 Medical Rec #:  [id number]               Height:       60.0 in Accession #:    [id number]              Weight:       150.0 lb Date of Birth:  [birth year]               BSA:          1.652 m Patient Age:    25 years                BP:           144/75 mmHg Patient Gender: F                       HR:           65 bpm. Exam Location:  Inpatient Procedure: 2D Echo, Cardiac Doppler and Color Doppler Indications:    Chest pain  History:        Patient has prior history of Echocardiogram examinations. Risk                 Factors:Hypertension and Diabetes. Hx CVA and breast cancer.  Sonographer:    Clayton Lefort RDCS (AE) Referring Phys: [id number] AMRIT ADHIKARI IMPRESSIONS  1. Left ventricular ejection fraction, by estimation, is 60 to 65%. The left ventricle has normal function. The left ventricle has no regional wall motion abnormalities. There is mild concentric left ventricular hypertrophy. Left ventricular diastolic parameters are consistent with Grade I diastolic dysfunction (impaired relaxation).  2. Right ventricular systolic function is normal. The right ventricular size is normal. There is normal pulmonary artery systolic pressure.  3. The mitral valve is normal in structure. Trivial mitral valve regurgitation. No evidence of mitral stenosis.  4. The aortic valve is tricuspid. Aortic valve regurgitation is not visualized. No aortic stenosis is present.  5.  The inferior vena cava is normal in size with greater than 50% respiratory variability, suggesting right atrial pressure of 3 mmHg. FINDINGS  Left Ventricle: Left ventricular ejection fraction, by estimation, is 60 to 65%. The left ventricle has normal function. The left ventricle has no regional wall motion abnormalities. The left ventricular internal cavity size was normal in size. There is  mild concentric left ventricular hypertrophy. Left ventricular diastolic parameters are consistent with Grade I diastolic dysfunction (impaired relaxation). Indeterminate filling pressures. Right Ventricle: The right ventricular size is normal. No increase in right ventricular wall thickness. Right ventricular systolic function is normal. There is normal pulmonary artery systolic pressure. The tricuspid regurgitant velocity is 2.10 m/s, and  with an assumed right atrial pressure of 3 mmHg, the estimated right ventricular systolic pressure is  20.6 mmHg. Left Atrium: Left atrial size was normal in size. Right Atrium: Right atrial size was normal in size. Pericardium: There is no evidence of pericardial effusion. Mitral Valve: The mitral valve is normal in structure. Trivial mitral valve regurgitation. No evidence of mitral valve stenosis. MV peak gradient, 5.4 mmHg. The mean mitral valve gradient is 2.0 mmHg. Tricuspid Valve: The tricuspid valve is normal in structure. Tricuspid valve regurgitation is trivial. No evidence of tricuspid stenosis. Aortic Valve: The aortic valve is tricuspid. Aortic valve regurgitation is not visualized. No aortic stenosis is present. Aortic valve mean gradient measures 4.0 mmHg. Aortic valve peak gradient measures 8.4 mmHg. Aortic valve area, by VTI measures 2.66 cm. Pulmonic Valve: The pulmonic valve was normal in structure. Pulmonic valve regurgitation is not visualized. No evidence of pulmonic stenosis. Aorta: The aortic root is normal in size and structure. Venous: The inferior vena cava is  normal in size with greater than 50% respiratory variability, suggesting right atrial pressure of 3 mmHg. IAS/Shunts: No atrial level shunt detected by color flow Doppler.  LEFT VENTRICLE PLAX 2D LVIDd:         3.80 cm   Diastology LVIDs:         2.00 cm   LV e' medial:    7.83 cm/s LV PW:         1.10 cm   LV E/e' medial:  11.1 LV IVS:        1.20 cm   LV e' lateral:   11.70 cm/s LVOT diam:     2.00 cm   LV E/e' lateral: 7.4 LV SV:         87 LV SV Index:   52 LVOT Area:     3.14 cm  RIGHT VENTRICLE             IVC RV Basal diam:  2.90 cm     IVC diam: 1.30 cm RV S prime:     17.00 cm/s TAPSE (M-mode): 2.2 cm LEFT ATRIUM           Index        RIGHT ATRIUM           Index LA diam:      3.50 cm 2.12 cm/m   RA Area:     11.00 cm LA Vol (A2C): 33.4 ml 20.22 ml/m  RA Volume:   22.00 ml  13.32 ml/m LA Vol (A4C): 42.0 ml 25.43 ml/m  AORTIC VALVE AV Area (Vmax):    2.53 cm AV Area (Vmean):   2.55 cm AV Area (VTI):     2.66 cm AV Vmax:           145.00 cm/s AV Vmean:          97.000 cm/s AV VTI:            0.326 m AV Peak Grad:      8.4 mmHg AV Mean Grad:      4.0 mmHg LVOT Vmax:         117.00 cm/s LVOT Vmean:        78.600 cm/s LVOT VTI:          0.276 m LVOT/AV VTI ratio: 0.85  AORTA Ao Root diam: 2.10 cm Ao Asc diam:  3.60 cm MITRAL VALVE                TRICUSPID VALVE MV Area (PHT): 2.75 cm     TR Peak grad:   17.6 mmHg MV Area  VTI:   2.08 cm     TR Vmax:        210.00 cm/s MV Peak grad:  5.4 mmHg MV Mean grad:  2.0 mmHg     SHUNTS MV Vmax:       1.16 m/s     Systemic VTI:  0.28 m MV Vmean:      62.8 cm/s    Systemic Diam: 2.00 cm MV Decel Time: 276 msec MV E velocity: 87.00 cm/s MV A velocity: 124.00 cm/s MV E/A ratio:  0.70 Skeet Latch MD Electronically signed by Skeet Latch MD Signature Date/Time: 11/17/2021/8:56:28 PM    Final     Procedures Procedures (including critical care time)  Medications Ordered in UC Medications - No data to display  Initial Impression / Assessment and Plan /  UC Course  I have reviewed the triage vital signs and the nursing notes.  Pertinent labs & imaging results that were available during my care of the patient were reviewed by me and considered in my medical decision making (see chart for details).  Chest pain unspecified Shortness of breath  EKG showing sinus bradycardia, heart rate 55, per last EKG in 2019, normal sinus rhythm showing, patient is currently endorsing chest pain/discomfort and shortness of breath, due to history being sent to the nearest emergency department not emergently by [facility] for further evaluation and work-up Final Clinical Impressions(s) / UC Diagnoses   Final diagnoses:  None   Discharge Instructions   None    ED Prescriptions   None    PDMP not reviewed this encounter.   Hans Eden, [place] 11/18/21 [phone]

## 2021-11-19 ENCOUNTER — Telehealth: Payer: Self-pay

## 2021-11-19 NOTE — Telephone Encounter (Signed)
Transition Care Management Follow-up Telephone Call   Date of discharge and from where:[facility] on 11/18/2021 How have you been since you were released from the hospital? Called pt, Brandi Bryant answered stated she is not with the Brandi Bryant at this time. [facility] DPR SIGNED to speak with Brandi Bryant(Brandi Bryant). Brandi Bryant stated Brandi Bryant feeling little better at this time but still has on and off SOB and palpitations symptoms. Stated was recommended to F/u with cardiologist and to continue prescribed medications.  Any questions or concerns? No questions/concerns reported.  Items Reviewed: Did the pt receive and understand the discharge instructions provided? have the instructions and have no questions.  Medications obtained and verified? No medications were given upon discharge Any new allergies since your discharge? None reported  Do you have support at home? Yes, Brandi Bryant  Other (ie: DME, Home Health, etc)    N/A    Functional Questionnaire: (I = Independent and D = Dependent) ADL's:  Independent.        Follow up appointments reviewed:   PCP Hospital f/u appt confirmed? [facility] on 12/01/2020@ 1050.  [facility] f/u appt confirmed? Cardiology scheduled at this time  Are transportation arrangements needed? have transportation   If their condition worsens, is the pt aware to call  their PCP or go to the ED? Yes.Made Brandi Bryant aware if condition worsen or start experiencing rapid weight gain, chest pains, diff breathing, SOB, high fevers, or bleading to refer imediately to ED for further evaluation.  Was the patient provided with contact information for the PCP's office or ED? Brandi Bryant stated both Brandi Bryant and her has the phone number  Was the pt encouraged to call back with questions or concerns?yes

## 2021-11-29 ENCOUNTER — Other Ambulatory Visit: Payer: Self-pay

## 2021-12-01 ENCOUNTER — Ambulatory Visit: Payer: Self-pay | Attending: Family Medicine | Admitting: Family Medicine

## 2021-12-01 ENCOUNTER — Other Ambulatory Visit: Payer: Self-pay

## 2021-12-01 VITALS — BP 136/68 | HR 58 | Ht 60.0 in | Wt 149.0 lb

## 2021-12-01 DIAGNOSIS — H5440 Blindness, one eye, unspecified eye: Secondary | ICD-10-CM

## 2021-12-01 DIAGNOSIS — E7800 Pure hypercholesterolemia, unspecified: Secondary | ICD-10-CM

## 2021-12-01 DIAGNOSIS — H5450 Low vision, one eye, unspecified eye: Secondary | ICD-10-CM

## 2021-12-01 DIAGNOSIS — I10 Essential (primary) hypertension: Secondary | ICD-10-CM

## 2021-12-01 DIAGNOSIS — Z1231 Encounter for screening mammogram for malignant neoplasm of breast: Secondary | ICD-10-CM

## 2021-12-01 DIAGNOSIS — Z23 Encounter for immunization: Secondary | ICD-10-CM

## 2021-12-01 DIAGNOSIS — Z794 Long term (current) use of insulin: Secondary | ICD-10-CM

## 2021-12-01 DIAGNOSIS — E1169 Type 2 diabetes mellitus with other specified complication: Secondary | ICD-10-CM

## 2021-12-01 LAB — POCT GLYCOSYLATED HEMOGLOBIN (HGB A1C): HbA1c, POC (controlled diabetic range): 6.7 % (ref 0.0–7.0)

## 2021-12-01 MED ORDER — BASAGLAR KWIKPEN 100 UNIT/ML ~~LOC~~ SOPN
5.0000 [IU] | PEN_INJECTOR | Freq: Every day | SUBCUTANEOUS | 2 refills | Status: DC
  Filled 2021-12-01: qty 15, 300d supply, fill #0
  Filled 2021-12-28: qty 3, 28d supply, fill #0
  Filled 2022-01-26: qty 3, 28d supply, fill #1
  Filled 2022-02-21: qty 9, 84d supply, fill #2
  Filled 2022-05-26: qty 9, 84d supply, fill #3

## 2021-12-01 MED ORDER — ROSUVASTATIN CALCIUM 5 MG PO TABS
5.0000 mg | ORAL_TABLET | Freq: Every day | ORAL | 2 refills | Status: DC
  Filled 2021-12-01 – 2021-12-28 (×2): qty 30, 30d supply, fill #0
  Filled 2022-02-21: qty 30, 30d supply, fill #1
  Filled 2022-03-24: qty 30, 30d supply, fill #2

## 2021-12-01 NOTE — Progress Notes (Signed)
Subjective:  Patient ID: Brandi Bryant, female    DOB: [birth year]  Age: 66 y.o. MRN: [id number]  CC: Hospitalization Follow-up   HPI Brandi Bryant is a 66 y.o. year old female with a history of GERD, hypertension, hyperlipidemia, breast cancer status post left lumpectomy and radiation, type 2 diabetes mellitus (A1c 6.7) previous CVA, TIA in 03/2018 seen for a transition of care visit Hospitalized 12/27 through 11/18/2021 for chest pain.  Troponins were negative and echocardiogram revealed LVEF of 60 to 65%. She was subsequently discharged to follow-up with cardiology outpatient.  Interval History: She has no chest pains or dyspnea but has palpitations and a follow-up with cardiology comes up on 01/06/2022  She complains of 5-6 months visual loss in her R eye which was progressive and for the L eye the brightness of light at night affects her and prevents her from driving  For Diabetes she has not been taking Basaglar as prescribed but rather with blood sugars of >200 she would administer 15 units of Basaglar and she does this once or twice/week. Endorses compliance with metformin and glipizide. She has no numbness in her extremities or hypoglycemic symptoms. Doing well on her antihypertensive and her statin. Past Medical History:  Diagnosis Date   Abdominal pain    Allergy    Arthralgia 08/13/2013   Bell palsy 2006   Bell's palsy    Breast cancer ([place]) 2009   Left Breast Cancer   Cataract    Chest pain 09/30/2013   Cholelithiasis    Chronic sinusitis 05/16/2017   Colon cancer screening 59/03/6386   Complication of anesthesia    " tubal ligation, in [place]"  had weakness, couldnt stand up the next day, was given pills for oxygen for Brain."  1 month after I was having loss of attentiviness, still have to focus  carefully.    Coronary artery disease    CVA (cerebral vascular accident) ([place]) 03/30/2018   Depression    Diabetes mellitus    Type II   Dyspnea     at times- when air conditioner is runnimg, heat also    Encounter for screening colonoscopy 10/30/2015   GERD (gastroesophageal reflux disease)    Hepatic steatosis    History of breast cancer 06/01/2012   HTN (hypertension)    Hx of adenomatous polyp of colon 11/07/2019   Hyperlipidemia    Internal carotid artery stenosis 04/03/2018   Personal history of chemotherapy 2009   Left Breast Cancer   Personal history of radiation therapy 2009   Left Breast Cancer   Stroke [facility])    2006, 2015   TIA (transient ischemic attack) 02/20/2014    Past Surgical History:  Procedure Laterality Date   ANKLE ARTHROSCOPY Right 12/14/2016   Procedure: Right Ankle Arthroscopic Debridement;  Surgeon: Newt Minion, MD;  Location: [place];  Service: Orthopedics;  Laterality: Right;   Arm surgery     Left tendon lengthen   BREAST LUMPECTOMY Left 2009   CHOLECYSTECTOMY N/A 12/09/2015   Procedure: LAPAROSCOPIC CHOLECYSTECTOMY WITH INTRAOPERATIVE CHOLANGIOGRAM;  Surgeon: Greer Pickerel, MD;  Location: [place];  Service: General;  Laterality: N/A;   LEFT HEART CATHETERIZATION WITH CORONARY ANGIOGRAM N/A 10/30/2013   Procedure: LEFT HEART CATHETERIZATION WITH CORONARY ANGIOGRAM;  Surgeon: Josue Hector, MD;  Location: [facility] CATH LAB;  Service: Cardiovascular;  Laterality: N/A;   porta cath Right    Removal of Porta cath Right    TUBAL LIGATION      Family History  Problem  Relation Age of Onset   Thyroid cancer Sister    Hypertension Mother    Diabetes Mother    Migraines Daughter    Colon cancer Neg Hx    Esophageal cancer Neg Hx    Stomach cancer Neg Hx    Rectal cancer Neg Hx     Allergies  Allergen Reactions   Gadolinium Derivatives Nausea And Vomiting    Code: VOM, Desc: Pt began vomiting 45 sec after MRI contrast injection of Multihance, Onset Date: [id number]     Iodinated Contrast Media Nausea And Vomiting    Outpatient Medications Prior to Visit  Medication Sig Dispense Refill   aspirin 325 MG EC  tablet Take 1 tablet (325 mg total) by mouth daily. 30 tablet 6   carvedilol (COREG) 12.5 MG tablet TAKE 1 TABLET (12.5 MG TOTAL) BY MOUTH 2 (TWO) TIMES DAILY WITH A MEAL. (Patient taking differently: Take 12.5 mg by mouth 2 (two) times daily with a meal.) 60 tablet 6   cyclobenzaprine (FLEXERIL) 10 MG tablet Take 1 tablet (10 mg total) by mouth every 8 (eight) hours as needed for muscle spasms. 60 tablet 0   glipiZIDE (GLUCOTROL) 10 MG tablet TAKE 1 TABLET (10 MG TOTAL) BY MOUTH 2 (TWO) TIMES DAILY BEFORE A MEAL. (Patient taking differently: Take 10 mg by mouth 2 (two) times daily before a meal.) 60 tablet 6   Insulin Pen Needle (TRUEPLUS PEN NEEDLES) 32G X 4 MM MISC USE TO INJECT LANTUS DAILY (Patient taking differently: 1 each by Other route See admin instructions. USE TO INJECT LANTUS DAILY) 100 each 2   lisinopril (ZESTRIL) 20 MG tablet Take 1 tablet (20 mg total) by mouth daily. 30 tablet 11   meloxicam (MOBIC) 7.5 MG tablet Take 1 tablet (7.5 mg total) by mouth daily. 30 tablet 1   rizatriptan (MAXALT) 5 MG tablet TAKE 1 TABLET (5 MG TOTAL) BY MOUTH AS NEEDED FOR MIGRAINE. MAY REPEAT IN 2 HOURS IF NEEDED (Patient taking differently: Take 5 mg by mouth as needed for migraine (May repeat in 2 hours if needed.).) 10 tablet 3   traMADol (ULTRAM) 50 MG tablet Take 1 tablet (50 mg total) by mouth every 8 (eight) hours as needed (for pain and muscle spasms). 30 tablet 0   traZODone (DESYREL) 100 MG tablet TAKE 1 TABLET (100 MG TOTAL) BY MOUTH AT BEDTIME AS NEEDED FOR SLEEP. (Patient taking differently: Take 100 mg by mouth at bedtime as needed for sleep.) 30 tablet 3   Insulin Glargine (BASAGLAR KWIKPEN) 100 UNIT/ML Inject 32 Units into the skin 2 (two) times daily. 15 mL 2   rosuvastatin (CRESTOR) 5 MG tablet Take 1 tablet (5 mg total) by mouth daily. 30 tablet 2   No facility-administered medications prior to visit.     ROS Review of Systems  Constitutional:  Negative for activity change,  appetite change and fatigue.  HENT:  Negative for congestion, sinus pressure and sore throat.   Eyes:  Positive for visual disturbance.  Respiratory:  Negative for cough, chest tightness, shortness of breath and wheezing.   Cardiovascular:  Negative for chest pain and palpitations.  Gastrointestinal:  Negative for abdominal distention, abdominal pain and constipation.  Endocrine: Negative for polydipsia.  Genitourinary:  Negative for dysuria and frequency.  Musculoskeletal:  Negative for arthralgias and back pain.  Skin:  Negative for rash.  Neurological:  Negative for tremors, light-headedness and numbness.  Hematological:  Does not bruise/bleed easily.  Psychiatric/Behavioral:  Negative for agitation and behavioral  problems.    Objective:  BP 136/68    Pulse (!) 58    Ht 5' (1.524 m)    Wt 149 lb (67.6 kg)    SpO2 98%    BMI 29.10 kg/m   BP/Weight 12/01/2021 11/18/2021 86/76/1950  Systolic BP 932 671 -  Diastolic BP 68 59 -  Wt. (Lbs) 149 - 150  BMI 29.1 - 29.29      Physical Exam Constitutional:      Appearance: She is well-developed.  Eyes:     Comments: Presence of cataract noted on the right, immature cataract noted in the left  Cardiovascular:     Rate and Rhythm: Bradycardia present.     Heart sounds: Normal heart sounds. No murmur heard. Pulmonary:     Effort: Pulmonary effort is normal.     Breath sounds: Normal breath sounds. No wheezing or rales.  Chest:     Chest wall: No tenderness.  Abdominal:     General: Bowel sounds are normal. There is no distension.     Palpations: Abdomen is soft. There is no mass.     Tenderness: There is no abdominal tenderness.  Musculoskeletal:        General: Normal range of motion.     Right lower leg: No edema.     Left lower leg: No edema.  Neurological:     Mental Status: She is alert and oriented to person, place, and time.  Psychiatric:        Mood and Affect: Mood normal.    CMP Latest Ref Rng & Units 11/16/2021  11/16/2021 07/19/2021  Glucose 70 - 99 mg/dL - 167(H) 167(H)  BUN 8 - 23 mg/dL - 16 13  Creatinine 0.44 - 1.00 mg/dL 0.80 0.99 0.81  Sodium 135 - 145 mmol/L - 137 136  Potassium 3.5 - 5.1 mmol/L - 3.7 3.7  Chloride 98 - 111 mmol/L - 109 108  CO2 22 - 32 mmol/L - 18(L) 20(L)  Calcium 8.9 - 10.3 mg/dL - 9.4 8.6(L)  Total Protein 6.5 - 8.1 g/dL - - -  Total Bilirubin 0.3 - 1.2 mg/dL - - -  Alkaline Phos 38 - 126 U/L - - -  AST 15 - 41 U/L - - -  ALT 0 - 44 U/L - - -    Lipid Panel     Component Value Date/Time   CHOL 113 12/02/2020 0837   TRIG 107 12/02/2020 0837   HDL 41 12/02/2020 0837   CHOLHDL 2.8 12/02/2020 0837   CHOLHDL 2.6 03/31/2018 0348   VLDL 33 03/31/2018 0348   LDLCALC 52 12/02/2020 0837    CBC    Component Value Date/Time   WBC 6.1 11/16/2021 2050   RBC 4.43 11/16/2021 2050   HGB 13.7 11/16/2021 2050   HGB 13.4 09/30/2013 1033   HCT 38.7 11/16/2021 2050   HCT 39.1 09/30/2013 1033   PLT 134 (L) 11/16/2021 2050   PLT 150 09/30/2013 1033   MCV 87.4 11/16/2021 2050   MCV 88.5 09/30/2013 1033   MCH 30.9 11/16/2021 2050   MCHC 35.4 11/16/2021 2050   RDW 12.3 11/16/2021 2050   RDW 12.8 09/30/2013 1033   LYMPHSABS 1.3 07/19/2021 0303   LYMPHSABS 2.1 09/30/2013 1033   MONOABS 1.0 07/19/2021 0303   MONOABS 0.4 09/30/2013 1033   EOSABS 0.2 07/19/2021 0303   EOSABS 0.2 09/30/2013 1033   BASOSABS 0.0 07/19/2021 0303   BASOSABS 0.0 09/30/2013 1033    Lab Results  Component  Value Date   HGBA1C 6.7 12/01/2021    Vision Screening   Right eye Left eye Both eyes  Without correction 0/0 20/50   With correction       Assessment & Plan:  1. Type 2 diabetes mellitus with other specified complication, with long-term current use of insulin (HCC) Controlled with A1c of 6.7 Due to decreased need of Basaglar which she has been administering 15 units twice a week , I have decreased her previous dose to 5 units to be taking consistently at bedtime daily Continue  glipizide and metformin. - POCT glycosylated hemoglobin (Hb A1C) - rosuvastatin (CRESTOR) 5 MG tablet; Take 1 tablet (5 mg total) by mouth daily.  Dispense: 30 tablet; Refill: 2 - Insulin Glargine (BASAGLAR KWIKPEN) 100 UNIT/ML; Inject 5 Units into the skin at bedtime.  Dispense: 15 mL; Refill: 2  2. 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 Encounter for screening mammogram for malignant neoplasm of breast - MM DIGITAL SCREENING BILATERAL; Future  4.  Hypertension Controlled Continue current regimen Counseled on blood pressure goal of less than 130/80, low-sodium, DASH diet, medication compliance, 150 minutes of moderate intensity exercise per week. Discussed medication compliance, adverse effects.  5.  Hyperlipidemia Controlled Continue Crestor Meds ordered this encounter  Medications   rosuvastatin (CRESTOR) 5 MG tablet    Sig: Take 1 tablet (5 mg total) by mouth daily.    Dispense:  30 tablet    Refill:  2   Insulin Glargine (BASAGLAR KWIKPEN) 100 UNIT/ML    Sig: Inject 5 Units into the skin at bedtime.    Dispense:  15 mL    Refill:  2    Follow-up: Return in about 3 months (around 03/01/2022) for Chronic medical conditions.       Charlott Rakes, MD, FAAFP. [facility] and [place], [place]   12/01/2021, 11:48 AM

## 2021-12-01 NOTE — Progress Notes (Signed)
Vision trouble.

## 2021-12-07 ENCOUNTER — Other Ambulatory Visit: Payer: Self-pay

## 2021-12-08 ENCOUNTER — Ambulatory Visit: Payer: No Typology Code available for payment source | Admitting: Physician Assistant

## 2021-12-14 ENCOUNTER — Other Ambulatory Visit: Payer: Self-pay

## 2021-12-28 ENCOUNTER — Other Ambulatory Visit: Payer: Self-pay

## 2021-12-30 ENCOUNTER — Other Ambulatory Visit: Payer: Self-pay

## 2022-01-05 NOTE — Progress Notes (Signed)
Cardiology Office Note:    Date:  01/06/2022   ID:  Brandi Bryant, DOB 04-Nov-1956, MRN [id number]  PCP:  Charlott Rakes, [place] Cardiologist: Sanda Klein, MD   Reason for visit: 6-week follow-up  History of Present Illness:    Brandi Bryant is a 66 y.o. female with a hx of hypertension, diabetes, hyperlipidemia, left breast cancer status postlumpectomy and chemotherapy without radiation, aortic and coronary arteries atherosclerosis on chest CT, moderate nonobstructive coronary disease on left heart cath 2014, pericarditis in 2018.  She was last seen by Dr. Sallyanne Kuster in September 2022.  She complained of palpitations occurring 3-4 times a week associated with shortness of breath.  She wore a Zio patch showing no arrhythmias, less than 1% PACs and PVCs.  She also had high blood pressure with systolic blood pressures 235-361W.  Lisinopril was increased to 20 mg daily.  She was admitted from December 27 to December 29 with abdominal discomfort, chest pain and palpitations.  EKG showed sinus bradycardia, heart rate 50s and 60s. EKG, telemetry show sinus rhythm - no arrhythmias to correlate with symptoms while on tele.  CXR showed no acute cardiopulmonary disease. TSH normal. CRP, ESR normal so unlikely to be pericarditis.  Echo 11/17/21 showed LVEF 60-65%, no LV wall motion abnormalities, grade I diastolic dysfunction.  Today, she complains of persistent palpitations that occur 4-5 times per day.  She feels her heart rate going slow then faster.  No specific trigger.  It can happen when she is sitting, laying down, driving or walking.  Nothing makes it better.  Sometimes she feels like she has to breathe deeply.  She can have a chest pressure afterwards that can last 2 to 3 hours at night.  Systolic blood pressure in the morning is 120.  After she is active or had a headache systolic blood pressure can be 140.  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  She is denies high stress.  She states since her visit with Dr. Sallyanne Kuster in September, the palpitations have more often.  She states sometimes she has lightheadedness, no syncope.  She denies leg swelling.  She denies snoring, daytime fatigue and states she wakes up refreshed.  She states her aspirin was increased to 325 mg at some point which she is continued.     Past Medical History:  Diagnosis Date   Abdominal pain    Allergy    Arthralgia 08/13/2013   Bell palsy 2006   Bell's palsy    Breast cancer ([place]) 2009   Left Breast Cancer   Cataract    Chest pain 09/30/2013   Cholelithiasis    Chronic sinusitis 05/16/2017   Colon cancer screening 43/11/5398   Complication of anesthesia    " tubal ligation, in [place]"  had weakness, couldnt stand up the next day, was given pills for oxygen for Brain."  1 month after I was having loss of attentiviness, still have to focus  carefully.    Coronary artery disease    CVA (cerebral vascular accident) ([place]) 03/30/2018   Depression    Diabetes mellitus    Type II   Dyspnea    at times- when air conditioner is runnimg, heat also    Encounter for screening colonoscopy 10/30/2015   GERD (gastroesophageal reflux disease)    Hepatic steatosis    History of breast cancer 06/01/2012   HTN (hypertension)    Hx of adenomatous polyp of colon 11/07/2019   Hyperlipidemia  Internal carotid artery stenosis 04/03/2018   Personal history of chemotherapy 2009   Left Breast Cancer   Personal history of radiation therapy 2009   Left Breast Cancer   Stroke [facility])    2006, 2015   TIA (transient ischemic attack) 02/20/2014    Past Surgical History:  Procedure Laterality Date   ANKLE ARTHROSCOPY Right 12/14/2016   Procedure: Right Ankle Arthroscopic Debridement;  Surgeon: Newt Minion, MD;  Location: [place];  Service: Orthopedics;  Laterality: Right;   Arm surgery     Left tendon lengthen   BREAST LUMPECTOMY Left 2009   CHOLECYSTECTOMY N/A  12/09/2015   Procedure: LAPAROSCOPIC CHOLECYSTECTOMY WITH INTRAOPERATIVE CHOLANGIOGRAM;  Surgeon: Greer Pickerel, MD;  Location: [place];  Service: General;  Laterality: N/A;   LEFT HEART CATHETERIZATION WITH CORONARY ANGIOGRAM N/A 10/30/2013   Procedure: LEFT HEART CATHETERIZATION WITH CORONARY ANGIOGRAM;  Surgeon: Josue Hector, MD;  Location: [facility] CATH LAB;  Service: Cardiovascular;  Laterality: N/A;   porta cath Right    Removal of Porta cath Right    TUBAL LIGATION      Current Medications: Current Meds  Medication Sig   aspirin 325 MG EC tablet Take 1 tablet (325 mg total) by mouth daily.   cyclobenzaprine (FLEXERIL) 10 MG tablet Take 1 tablet (10 mg total) by mouth every 8 (eight) hours as needed for muscle spasms.   diltiazem (CARDIZEM LA) 360 MG 24 hr tablet Take 1 tablet (360 mg total) by mouth daily.   glipiZIDE (GLUCOTROL) 10 MG tablet TAKE 1 TABLET (10 MG TOTAL) BY MOUTH 2 (TWO) TIMES DAILY BEFORE A MEAL. (Patient taking differently: Take 10 mg by mouth 2 (two) times daily before a meal.)   Insulin Glargine (BASAGLAR KWIKPEN) 100 UNIT/ML Inject 5 Units into the skin at bedtime.   Insulin Pen Needle (TRUEPLUS PEN NEEDLES) 32G X 4 MM MISC USE TO INJECT LANTUS DAILY (Patient taking differently: 1 each by Other route See admin instructions. USE TO INJECT LANTUS DAILY)   lisinopril (ZESTRIL) 20 MG tablet Take 1 tablet (20 mg total) by mouth daily.   meloxicam (MOBIC) 7.5 MG tablet Take 1 tablet (7.5 mg total) by mouth daily.   rizatriptan (MAXALT) 5 MG tablet TAKE 1 TABLET (5 MG TOTAL) BY MOUTH AS NEEDED FOR MIGRAINE. MAY REPEAT IN 2 HOURS IF NEEDED (Patient taking differently: Take 5 mg by mouth as needed for migraine (May repeat in 2 hours if needed.).)   rosuvastatin (CRESTOR) 5 MG tablet Take 1 tablet (5 mg total) by mouth daily.   traMADol (ULTRAM) 50 MG tablet Take 1 tablet (50 mg total) by mouth every 8 (eight) hours as needed (for pain and muscle spasms).   traZODone (DESYREL) 100 MG  tablet TAKE 1 TABLET (100 MG TOTAL) BY MOUTH AT BEDTIME AS NEEDED FOR SLEEP. (Patient taking differently: Take 100 mg by mouth at bedtime as needed for sleep.)   [DISCONTINUED] carvedilol (COREG) 12.5 MG tablet TAKE 1 TABLET (12.5 MG TOTAL) BY MOUTH 2 (TWO) TIMES DAILY WITH A MEAL. (Patient taking differently: Take 12.5 mg by mouth 2 (two) times daily with a meal.)     Allergies:   Gadolinium derivatives and Iodinated contrast media   Social History   Socioeconomic History   Marital status: Single    Spouse name: Not on file   Number of children: 6   Years of education: Not on file   Highest education level: High school graduate  Occupational History   Not on file  Tobacco Use   Smoking status: Never   Smokeless tobacco: Never  Vaping Use   Vaping Use: Never used  Substance and Sexual Activity   Alcohol use: No   Drug use: No   Sexual activity: Not Currently    Birth control/protection: Surgical  Other Topics Concern   Not on file  Social History Narrative   Single with 6 children    lives with her daughter   Right handed   Never smoker no drug use no alcohol   Social Determinants of Health   Financial Resource Strain: Not on file  Food Insecurity: Not on file  Transportation Needs: Not on file  Physical Activity: Not on file  Stress: Not on file  Social Connections: Not on file     Family History: The patient's family history includes Diabetes in her mother; Hypertension in her mother; Migraines in her daughter; Thyroid cancer in her sister. There is no history of Colon cancer, Esophageal cancer, Stomach cancer, or Rectal cancer.  ROS:   Please see the history of present illness.     EKGs/Labs/Other Studies Reviewed:    EKG:  The ekg ordered today demonstrates sinus bradycardia, heart rate 56, PR interval 172 ms, QRS duration 160 ms.  Recent Labs: 07/16/2021: ALT 26 11/16/2021: BUN 16; Creatinine, Ser 0.80; Hemoglobin 13.7; Platelets 134; Potassium 3.7; Sodium  137; TSH 1.956   Recent Lipid Panel Lab Results  Component Value Date/Time   CHOL 113 12/02/2020 08:37 AM   TRIG 107 12/02/2020 08:37 AM   HDL 41 12/02/2020 08:37 AM   LDLCALC 52 12/02/2020 08:37 AM    Physical Exam:    VS:  BP (!) 150/70 (BP Location: Left Arm, Patient Position: Sitting)    Pulse (!) 56    Ht 5' (1.524 m)    Wt 150 lb 12.8 oz (68.4 kg)    SpO2 95%    BMI 29.45 kg/m    No data found.  Wt Readings from Last 3 Encounters:  01/06/22 150 lb 12.8 oz (68.4 kg)  12/01/21 149 lb (67.6 kg)  11/16/21 150 lb (68 kg)     GEN:  Well nourished, well developed in no acute distress HEENT: Normal NECK: No JVD; No carotid bruits CARDIAC: RRR, no murmurs, rubs, gallops RESPIRATORY:  Clear to auscultation without rales, wheezing or rhonchi  ABDOMEN: Soft, non-tender, non-distended MUSCULOSKELETAL: No edema; No deformity  SKIN: Warm and dry NEUROLOGIC:  Alert and oriented PSYCHIATRIC:  Normal affect     ASSESSMENT AND PLAN   Palpitations -Zio patch showing no arrhythmias, less than 1% PACs and PVCs. -Stop Coreg.  Try Cardizem CD 360 mg once daily.    Coronary artery disease -Moderate nonobstructive disease on left heart cath 2014 -If her symptoms do not improve with medications, may consider repeating a CTA of the coronaries.  Hypertension, reasonably controlled at home -Monitor blood pressure with medication change. -Asked patient to check blood pressure 3 times a week and bring blood pressure log to next appointment. -Goal BP is <130/80.    Hyperlipidemia -Check fasting lipids.  LDL at goal last year.  Continue Crestor. -Discussed cholesterol lowering diets - Mediterranean diet, DASH diet, vegetarian diet, low-carbohydrate diet and avoidance of trans fats.  Discussed healthier choice substitutes.  Nuts, high-fiber foods, and fiber supplements may also improve lipids.    Obesity  -Recommend moderate intensity activity for 30 minutes 5 days/week and the DASH diet.     Disposition - Follow-up in 4 to 6 weeks to  see if palpitations have improved on Cardizem.     Medication Adjustments/Labs and Tests Ordered: Current medicines are reviewed at length with the patient today.  Concerns regarding medicines are outlined above.  Orders Placed This Encounter  Procedures   Lipid panel   EKG 12-Lead   Meds ordered this encounter  Medications   diltiazem (CARDIZEM LA) 360 MG 24 hr tablet    Sig: Take 1 tablet (360 mg total) by mouth daily.    Dispense:  90 tablet    Refill:  3    Patient Instructions  Medication Instructions:  Stop Coreg. Start Cardizem 360 mg ( Take 1 Tablet Daily). *If you need a refill on your cardiac medications before your next appointment, please call your pharmacy*   Lab Work: Lipid Panel ( 1 week). If you have labs (blood work) drawn today and your tests are completely normal, you will receive your results only by: [place] (if you have MyChart) OR A paper copy in the mail If you have any lab test that is abnormal or we need to change your treatment, we will call you to review the results.   Testing/Procedures: No Testing   Follow-Up: At [facility], you and your health needs are our priority.  As part of our continuing mission to provide you with exceptional heart care, we have created designated Provider Care Teams.  These Care Teams include your primary Cardiologist (physician) and Advanced Practice Providers (APPs -  Physician Assistants and Nurse Practitioners) who all work together to provide you with the care you need, when you need it.  We recommend signing up for the patient portal called "MyChart".  Sign up information is provided on this After Visit Summary.  MyChart is used to connect with patients for Virtual Visits (Telemedicine).  Patients are able to view lab/test results, encounter notes, upcoming appointments, etc.  Non-urgent messages can be sent to your provider as well.   To learn more about  what you can do with MyChart, go to [email or web address].    Your next appointment:   4-6 week(s)  The format for your next appointment:   In Person  Provider:   Caron Presume, PA-C  If primary card or EP is not listed click here to update    :1}    Other Instructions Take Blood Pressure 2-3 Time Per week.   Signed, Warren Lacy, PA-C  01/06/2022 9:51 AM    [place]

## 2022-01-06 ENCOUNTER — Encounter: Payer: Self-pay | Admitting: Physician Assistant

## 2022-01-06 ENCOUNTER — Ambulatory Visit (INDEPENDENT_AMBULATORY_CARE_PROVIDER_SITE_OTHER): Payer: No Typology Code available for payment source | Admitting: Physician Assistant

## 2022-01-06 ENCOUNTER — Other Ambulatory Visit: Payer: Self-pay

## 2022-01-06 VITALS — BP 150/70 | HR 56 | Ht 60.0 in | Wt 150.8 lb

## 2022-01-06 DIAGNOSIS — E785 Hyperlipidemia, unspecified: Secondary | ICD-10-CM

## 2022-01-06 DIAGNOSIS — R002 Palpitations: Secondary | ICD-10-CM

## 2022-01-06 DIAGNOSIS — I10 Essential (primary) hypertension: Secondary | ICD-10-CM

## 2022-01-06 DIAGNOSIS — I2510 Atherosclerotic heart disease of native coronary artery without angina pectoris: Secondary | ICD-10-CM

## 2022-01-06 MED ORDER — DILTIAZEM HCL ER COATED BEADS 360 MG PO TB24
360.0000 mg | ORAL_TABLET | Freq: Every day | ORAL | 3 refills | Status: DC
  Filled 2022-01-06: qty 30, 30d supply, fill #0

## 2022-01-06 NOTE — Patient Instructions (Signed)
Medication Instructions:  Stop Coreg. Start Cardizem 360 mg ( Take 1 Tablet Daily). *If you need a refill on your cardiac medications before your next appointment, please call your pharmacy*   Lab Work: Lipid Panel ( 1 week). If you have labs (blood work) drawn today and your tests are completely normal, you will receive your results only by: [place] (if you have MyChart) OR A paper copy in the mail If you have any lab test that is abnormal or we need to change your treatment, we will call you to review the results.   Testing/Procedures: No Testing   Follow-Up: At [facility], you and your health needs are our priority.  As part of our continuing mission to provide you with exceptional heart care, we have created designated Provider Care Teams.  These Care Teams include your primary Cardiologist (physician) and Advanced Practice Providers (APPs -  Physician Assistants and Nurse Practitioners) who all work together to provide you with the care you need, when you need it.  We recommend signing up for the patient portal called "MyChart".  Sign up information is provided on this After Visit Summary.  MyChart is used to connect with patients for Virtual Visits (Telemedicine).  Patients are able to view lab/test results, encounter notes, upcoming appointments, etc.  Non-urgent messages can be sent to your provider as well.   To learn more about what you can do with MyChart, go to [email or web address].    Your next appointment:   4-6 week(s)  The format for your next appointment:   In Person  Provider:   Caron Presume, PA-C  If primary card or EP is not listed click here to update    :1}    Other Instructions Take Blood Pressure 2-3 Time Per week.

## 2022-01-07 ENCOUNTER — Other Ambulatory Visit: Payer: Self-pay

## 2022-01-10 ENCOUNTER — Other Ambulatory Visit: Payer: Self-pay

## 2022-01-16 ENCOUNTER — Emergency Department (HOSPITAL_COMMUNITY): Payer: No Typology Code available for payment source

## 2022-01-16 ENCOUNTER — Emergency Department (HOSPITAL_COMMUNITY)
Admission: EM | Admit: 2022-01-16 | Discharge: 2022-01-16 | Disposition: A | Discharge status: Home / Self Care | Payer: No Typology Code available for payment source | Attending: Emergency Medicine | Admitting: Emergency Medicine

## 2022-01-16 ENCOUNTER — Encounter (HOSPITAL_COMMUNITY): Payer: Self-pay | Admitting: Emergency Medicine

## 2022-01-16 ENCOUNTER — Other Ambulatory Visit: Payer: Self-pay

## 2022-01-16 DIAGNOSIS — Z794 Long term (current) use of insulin: Secondary | ICD-10-CM | POA: Insufficient documentation

## 2022-01-16 DIAGNOSIS — Z7982 Long term (current) use of aspirin: Secondary | ICD-10-CM | POA: Insufficient documentation

## 2022-01-16 DIAGNOSIS — R112 Nausea with vomiting, unspecified: Secondary | ICD-10-CM

## 2022-01-16 DIAGNOSIS — Z853 Personal history of malignant neoplasm of breast: Secondary | ICD-10-CM | POA: Insufficient documentation

## 2022-01-16 DIAGNOSIS — I10 Essential (primary) hypertension: Secondary | ICD-10-CM | POA: Insufficient documentation

## 2022-01-16 DIAGNOSIS — Z7984 Long term (current) use of oral hypoglycemic drugs: Secondary | ICD-10-CM | POA: Insufficient documentation

## 2022-01-16 DIAGNOSIS — R197 Diarrhea, unspecified: Secondary | ICD-10-CM | POA: Insufficient documentation

## 2022-01-16 DIAGNOSIS — R1084 Generalized abdominal pain: Secondary | ICD-10-CM | POA: Insufficient documentation

## 2022-01-16 DIAGNOSIS — Z79899 Other long term (current) drug therapy: Secondary | ICD-10-CM | POA: Insufficient documentation

## 2022-01-16 DIAGNOSIS — E119 Type 2 diabetes mellitus without complications: Secondary | ICD-10-CM | POA: Insufficient documentation

## 2022-01-16 DIAGNOSIS — I2510 Atherosclerotic heart disease of native coronary artery without angina pectoris: Secondary | ICD-10-CM | POA: Insufficient documentation

## 2022-01-16 LAB — COMPREHENSIVE METABOLIC PANEL
ALT: 26 U/L (ref 0–44)
AST: 35 U/L (ref 15–41)
Albumin: 3.4 g/dL — ABNORMAL LOW (ref 3.5–5.0)
Alkaline Phosphatase: 63 U/L (ref 38–126)
Anion gap: 9 (ref 5–15)
BUN: 9 mg/dL (ref 8–23)
CO2: 18 mmol/L — ABNORMAL LOW (ref 22–32)
Calcium: 8.9 mg/dL (ref 8.9–10.3)
Chloride: 114 mmol/L — ABNORMAL HIGH (ref 98–111)
Creatinine, Ser: 0.64 mg/dL (ref 0.44–1.00)
GFR, Estimated: >60 mL/min (ref >60)
Glucose, Bld: 89 mg/dL (ref 70–99)
Potassium: 3.9 mmol/L (ref 3.5–5.1)
Sodium: 141 mmol/L (ref 135–145)
Total Bilirubin: 0.7 mg/dL (ref 0.3–1.2)
Total Protein: 6.9 g/dL (ref 6.5–8.1)

## 2022-01-16 LAB — CBC
HCT: 42.7 % (ref 36.0–46.0)
Hemoglobin: 14.4 g/dL (ref 12.0–15.0)
MCH: 30.7 pg (ref 26.0–34.0)
MCHC: 33.7 g/dL (ref 30.0–36.0)
MCV: 91 fL (ref 80.0–100.0)
Platelets: 129 10*3/uL — ABNORMAL LOW (ref 150–400)
RBC: 4.69 MIL/uL (ref 3.87–5.11)
RDW: 12.7 % (ref 11.5–15.5)
WBC: 3.9 10*3/uL — ABNORMAL LOW (ref 4.0–10.5)
nRBC: 0 % (ref 0.0–0.2)

## 2022-01-16 LAB — CBG MONITORING, ED: Glucose-Capillary: 94 mg/dL (ref 70–99)

## 2022-01-16 LAB — URINALYSIS, ROUTINE W REFLEX MICROSCOPIC
Bilirubin Urine: NEGATIVE
Glucose, UA: NEGATIVE mg/dL
Hgb urine dipstick: NEGATIVE
Ketones, ur: NEGATIVE mg/dL
Leukocytes,Ua: NEGATIVE
Nitrite: NEGATIVE
Protein, ur: NEGATIVE mg/dL
Specific Gravity, Urine: 1.016 (ref 1.005–1.030)
pH: 6 (ref 5.0–8.0)

## 2022-01-16 LAB — LIPASE, BLOOD: Lipase: 35 U/L (ref 11–51)

## 2022-01-16 MED ORDER — LIDOCAINE VISCOUS HCL 2 % MT SOLN
15.0000 mL | Freq: Once | OROMUCOSAL | Status: AC
  Administered 2022-01-16: 15 mL via ORAL
  Filled 2022-01-16: qty 15

## 2022-01-16 MED ORDER — IOHEXOL 300 MG/ML  SOLN
100.0000 mL | Freq: Once | INTRAMUSCULAR | Status: AC | PRN
  Administered 2022-01-16: 100 mL via INTRAVENOUS

## 2022-01-16 MED ORDER — DICYCLOMINE HCL 20 MG PO TABS: 20.0000 mg | ORAL_TABLET | Freq: Two times a day (BID) | ORAL | 0 refills | Status: DC

## 2022-01-16 MED ORDER — ALUM & MAG HYDROXIDE-SIMETH 200-200-20 MG/5ML PO SUSP
30.0000 mL | Freq: Once | ORAL | Status: AC
  Administered 2022-01-16: 30 mL via ORAL
  Filled 2022-01-16: qty 30

## 2022-01-16 MED ORDER — DICYCLOMINE HCL 10 MG PO CAPS
10.0000 mg | ORAL_CAPSULE | Freq: Once | ORAL | Status: AC
  Administered 2022-01-16: 10 mg via ORAL
  Filled 2022-01-16: qty 1

## 2022-01-16 MED ORDER — SODIUM CHLORIDE 0.9 % IV BOLUS
1000.0000 mL | Freq: Once | INTRAVENOUS | Status: AC
  Administered 2022-01-16: 1000 mL via INTRAVENOUS

## 2022-01-16 MED ORDER — ONDANSETRON HCL 4 MG/2ML IJ SOLN
4.0000 mg | Freq: Once | INTRAMUSCULAR | Status: AC
  Administered 2022-01-16: 4 mg via INTRAVENOUS
  Filled 2022-01-16: qty 2

## 2022-01-16 MED ORDER — HYDROMORPHONE HCL 1 MG/ML IJ SOLN
0.5000 mg | Freq: Once | INTRAMUSCULAR | Status: AC
  Administered 2022-01-16: 0.5 mg via INTRAVENOUS
  Filled 2022-01-16: qty 1

## 2022-01-16 MED ORDER — ONDANSETRON 4 MG PO TBDP: 4.0000 mg | ORAL_TABLET | Freq: Three times a day (TID) | ORAL | 0 refills | Status: DC | PRN

## 2022-01-16 NOTE — ED Provider Notes (Signed)
[facility] Provider Note   CSN: [id number] Arrival date & time: 01/16/22  0753     History  Chief Complaint  Patient presents with   Abdominal Pain   Headache    [facility] is a 66 y.o. female.  66 year old female with past medical history of diabetes, hypertension, hepatic steatosis, CAD presents with complaint of generalized abdominal pain, intermittent x3 days.  Associated with nausea and diarrhea.  Also states that her blood sugars have been dropping down into the 60s and 70s at night, when this happens she eats bread and drinks juice.  States she is having chills without fevers, denies changes in bladder habits.  Denies chest pain, shortness of breath.  No other complaints or concerns. Prior abdominal surgery includes cholecystectomy, tubal ligation.      Home Medications Prior to Admission medications   Medication Sig Start Date End Date Taking? Authorizing Provider  dicyclomine (BENTYL) 20 MG tablet Take 1 tablet (20 mg total) by mouth 2 (two) times daily. 01/16/22  Yes Tacy Learn, PA-C  ondansetron (ZOFRAN-ODT) 4 MG disintegrating tablet Take 1 tablet (4 mg total) by mouth every 8 (eight) hours as needed for nausea or vomiting. 01/16/22  Yes Tacy Learn, PA-C  aspirin 325 MG EC tablet Take 1 tablet (325 mg total) by mouth daily. 06/14/18   Argentina Donovan, PA-C  cyclobenzaprine (FLEXERIL) 10 MG tablet Take 1 tablet (10 mg total) by mouth every 8 (eight) hours as needed for muscle spasms. 08/13/21   Croitoru, Mihai, MD  diltiazem (CARDIZEM LA) 360 MG 24 hr tablet Take 1 tablet (360 mg total) by mouth daily. 01/06/22   Warren Lacy, PA-C  glipiZIDE (GLUCOTROL) 10 MG tablet TAKE 1 TABLET (10 MG TOTAL) BY MOUTH 2 (TWO) TIMES DAILY BEFORE A MEAL. Patient taking differently: Take 10 mg by mouth 2 (two) times daily before a meal. 08/03/21 08/03/22  Charlott Rakes, MD  Insulin Glargine (BASAGLAR KWIKPEN) 100 UNIT/ML  Inject 5 Units into the skin at bedtime. 12/01/21   Charlott Rakes, MD  Insulin Pen Needle (TRUEPLUS PEN NEEDLES) 32G X 4 MM MISC USE TO INJECT LANTUS DAILY Patient taking differently: 1 each by Other route See admin instructions. USE TO INJECT LANTUS DAILY 03/08/21   Charlott Rakes, MD  lisinopril (ZESTRIL) 20 MG tablet Take 1 tablet (20 mg total) by mouth daily. 08/13/21 08/13/22  Croitoru, Mihai, MD  meloxicam (MOBIC) 7.5 MG tablet Take 1 tablet (7.5 mg total) by mouth daily. 08/03/21   Charlott Rakes, MD  rizatriptan (MAXALT) 5 MG tablet TAKE 1 TABLET (5 MG TOTAL) BY MOUTH AS NEEDED FOR MIGRAINE. MAY REPEAT IN 2 HOURS IF NEEDED Patient taking differently: Take 5 mg by mouth as needed for migraine (May repeat in 2 hours if needed.). 05/13/21 05/13/22  Mayers, Cari S, PA-C  rosuvastatin (CRESTOR) 5 MG tablet Take 1 tablet (5 mg total) by mouth daily. 12/01/21 03/01/22  Charlott Rakes, MD  traMADol (ULTRAM) 50 MG tablet Take 1 tablet (50 mg total) by mouth every 8 (eight) hours as needed (for pain and muscle spasms). 08/13/21   Croitoru, Mihai, MD  traZODone (DESYREL) 100 MG tablet TAKE 1 TABLET (100 MG TOTAL) BY MOUTH AT BEDTIME AS NEEDED FOR SLEEP. Patient taking differently: Take 100 mg by mouth at bedtime as needed for sleep. 05/13/21 05/13/22  Mayers, Cari S, PA-C  escitalopram (LEXAPRO) 10 MG tablet Take 10 mg by mouth daily.  01/31/12  [provider]  Allergies    Gadolinium derivatives and Iodinated contrast media    Review of Systems   Review of Systems Negative except as per HPI Physical Exam Updated Vital Signs BP (!) 146/71    Pulse (!) 58    Temp 98.4 F (36.9 C) (Oral)    Resp 18    SpO2 98%  Physical Exam Vitals and nursing note reviewed.  Constitutional:      General: She is not in acute distress.    Appearance: She is well-developed. She is not diaphoretic.  HENT:     Head: Normocephalic and atraumatic.  Cardiovascular:     Rate and Rhythm: Normal rate and  regular rhythm.  Pulmonary:     Effort: Pulmonary effort is normal.     Breath sounds: Normal breath sounds.  Abdominal:     Palpations: Abdomen is soft.     Tenderness: There is generalized abdominal tenderness. There is no guarding or rebound.  Musculoskeletal:     Right lower leg: No edema.     Left lower leg: No edema.  Skin:    General: Skin is warm and dry.     Findings: No erythema or rash.  Neurological:     Mental Status: She is alert and oriented to person, place, and time.  Psychiatric:        Behavior: Behavior normal.    ED Results / Procedures / Treatments   Labs (all labs ordered are listed, but only abnormal results are displayed) Labs Reviewed  COMPREHENSIVE METABOLIC PANEL - Abnormal; Notable for the following components:      Result Value   Chloride 114 (*)    CO2 18 (*)    Albumin 3.4 (*)    All other components within normal limits  CBC - Abnormal; Notable for the following components:   WBC 3.9 (*)    Platelets 129 (*)    All other components within normal limits  URINALYSIS, ROUTINE W REFLEX MICROSCOPIC - Abnormal; Notable for the following components:   Color, Urine COLORLESS (*)    All other components within normal limits  LIPASE, BLOOD  CBG MONITORING, ED    EKG None  Radiology CT Abdomen Pelvis W Contrast  Result Date: 01/16/2022 CLINICAL DATA:  Abdominal pain.  Nausea, diarrhea for 4 days. EXAM: CT ABDOMEN AND PELVIS WITH CONTRAST TECHNIQUE: Multidetector CT imaging of the abdomen and pelvis was performed using the standard protocol following bolus administration of intravenous contrast. RADIATION DOSE REDUCTION: This exam was performed according to the departmental dose-optimization program which includes automated exposure control, adjustment of the mA and/or kV according to patient size and/or use of iterative reconstruction technique. CONTRAST:  165mL OMNIPAQUE IOHEXOL 300 MG/ML  SOLN COMPARISON:  11/17/2021 FINDINGS: Lower chest: Lung  bases are unremarkable.  Heart size is normal. Hepatobiliary: Nodular contour the liver consistent with cirrhosis. Prior cholecystectomy. No focal liver lesions. Pancreas: Unremarkable. No pancreatic ductal dilatation or surrounding inflammatory changes. Spleen: Normal in size without focal abnormality. Adrenals/Urinary Tract: Adrenal glands are normal. There are multifocal areas of scarring in both kidneys. Sub centimeters cysts appear stable. No suspicious renal lesion. No hydronephrosis. Ureters are unremarkable. The bladder and visualized portion of the urethra are normal. Stomach/Bowel: The stomach and small bowel loops are normal in appearance. Normal appendix. Colonic diverticulosis without acute diverticulitis. Average stool burden. Vascular/Lymphatic: There is dense atherosclerotic calcification of the abdominal aorta not associated with aneurysm. Although involved by atherosclerosis, there is vascular opacification of the celiac axis, superior mesenteric artery,  and inferior mesenteric artery. Normal appearance of the portal venous system and inferior vena cava. Reproductive: Uterus is present. Coarse calcifications are identified throughout the uterus. No adnexal mass. Other: No ascites. Abdominal wall is unremarkable. Musculoskeletal: No acute or significant osseous findings. IMPRESSION: 1.  No evidence for acute  abnormality. 2. Cirrhotic contour of the liver. 3. Cholecystectomy. 4. Stable appearance of multifocal scarring in both kidneys.  2 5. Colonic diverticulosis. 6.  Aortic atherosclerosis.  (ICD10-I70.0) Electronically Signed   By: Nolon Nations M.D.   On: 01/16/2022 10:24    Procedures Procedures    Medications Ordered in ED Medications  sodium chloride 0.9 % bolus 1,000 mL (0 mLs Intravenous Stopped 01/16/22 1050)  ondansetron (ZOFRAN) injection 4 mg (4 mg Intravenous Given 01/16/22 0918)  HYDROmorphone (DILAUDID) injection 0.5 mg (0.5 mg Intravenous Given 01/16/22 0919)  iohexol  (OMNIPAQUE) 300 MG/ML solution 100 mL (100 mLs Intravenous Contrast Given 01/16/22 1013)  alum & mag hydroxide-simeth (MAALOX/MYLANTA) 200-200-20 MG/5ML suspension 30 mL (30 mLs Oral Given 01/16/22 1207)    And  lidocaine (XYLOCAINE) 2 % viscous mouth solution 15 mL (15 mLs Oral Given 01/16/22 1207)  dicyclomine (BENTYL) capsule 10 mg (10 mg Oral Given 01/16/22 1206)  ondansetron (ZOFRAN) injection 4 mg (4 mg Intravenous Given 01/16/22 1417)    ED Course/ Medical Decision Making/ A&P                           Medical Decision Making Amount and/or Complexity of Data Reviewed Labs: ordered. Radiology: ordered.  Risk OTC drugs. Prescription drug management.   This patient presents to the ED for concern of abdominal pain with nausea, vomiting, diarrhea, this involves an extensive number of treatment options, and is a complaint that carries with it a high risk of complications and morbidity.  The differential diagnosis includes but not limited to gastritis, gastroenteritis, diverticulitis, small bowel obstruction, intra-abdominal mass or infection   Co morbidities that complicate the patient evaluation  Hypertension, hepatic steatosis, CAD, diabetes, history of breast cancer, CVA, TIA   Additional history obtained:  Additional history obtained from daughter at bedside External records from outside source obtained and reviewed including discharge summary from admission for chest pain on 11/16/2021, echo showing normal ventricular function   Lab Tests:  I Ordered, and personally interpreted labs.  The pertinent results include: CBC with a white count of 3.9, CMP with bicarb of 18, anion gap normal, glucose 89, do not suspect DKA.  Urinalysis is unremarkable, lipase is normal.   Imaging Studies ordered:  I ordered imaging studies including CT head and pelvis with contrast I independently visualized and interpreted imaging which showed unremarkable as read by radiologist I agree with  the radiologist interpretation   Cardiac Monitoring:  The patient was maintained on a cardiac monitor.  I personally viewed and interpreted the cardiac monitored which showed an underlying rhythm of: Sinus rhythm   Medicines ordered and prescription drug management:  I ordered medication including Dilaudid, Zofran, IV fluids, Bentyl, GI cocktail for abdominal pain, nausea, diarrhea Reevaluation of the patient after these medicines showed that the patient resolved I have reviewed the patients home medicines and have made adjustments as needed   Problem List / ED Course:  65 year old female with complaint of abdominal pain, nausea, vomiting, diarrhea as above x4 days.  On exam, found to have generalized abdominal tenderness.  Patient was evaluated, lab work is reassuring, CT abdomen pelvis reassuring.  She is  tolerating p.o. fluids at time of discharge.  Plan is to discharge with Bentyl and Zofran.  Recommend recheck with PCP this week, return to ED for worsening or concerning symptoms.   Reevaluation:  After the interventions noted above, I reevaluated the patient and found that they have :improved  Dispostion:  After consideration of the diagnostic results and the patients response to treatment, I feel that the patent would benefit from discharge with prescription for Zofran and Bentyl with plan to follow-up with primary care provider.  Return to ER for worsening or concerning symptoms..          Final Clinical Impression(s) / ED Diagnoses Final diagnoses:  Generalized abdominal pain  Nausea vomiting and diarrhea    Rx / DC Orders ED Discharge Orders          Ordered    dicyclomine (BENTYL) 20 MG tablet  2 times daily        01/16/22 1407    ondansetron (ZOFRAN-ODT) 4 MG disintegrating tablet  Every 8 hours PRN        01/16/22 1407              Tacy Learn, PA-C 01/16/22 1526    Varney Biles, MD 01/17/22 1058

## 2022-01-16 NOTE — Discharge Instructions (Addendum)
Zofran as needed as prescribed for nausea and vomiting. Bentyl as needed as prescribed for abdominal cramping. Return to the ER for worsening or concerning symptoms. Follow up with your doctor for recheck this week. Clear liquid diet, advance slowly to bland foods as tolerated.  Zofran segn sea necesario segn lo prescrito para las nuseas y los vmitos. Bentyl segn sea necesario segn lo prescrito para los calambres abdominales. Regrese a la sala de emergencias por empeoramiento o sntomas preocupantes. Haga un seguimiento con su mdico para volver a Acupuncturist. Dieta de lquidos claros, avance lentamente a alimentos blandos segn lo tolere.

## 2022-01-16 NOTE — ED Notes (Signed)
Patient transported to CT 

## 2022-01-16 NOTE — ED Notes (Signed)
Patient discharge instructions reviewed with the patient. The patient verbalized understanding of instructions. Patient discharged. 

## 2022-01-16 NOTE — ED Notes (Signed)
Pt daughter @ bedside with more questions about discharge. This RN and PA Percell Miller at bedside to answer daughters questions using interpreter machine.

## 2022-01-16 NOTE — ED Triage Notes (Signed)
C/o frontal headache, generalized abd pain, nausea, and diarrhea x 4 days.

## 2022-01-26 ENCOUNTER — Other Ambulatory Visit: Payer: Self-pay

## 2022-01-26 MED ORDER — TECHLITE PEN NEEDLES 32G X 4 MM MISC
0 refills | Status: DC
  Filled 2022-01-26: qty 100, 25d supply, fill #0

## 2022-01-27 ENCOUNTER — Other Ambulatory Visit: Payer: Self-pay

## 2022-01-28 ENCOUNTER — Other Ambulatory Visit: Payer: Self-pay

## 2022-02-02 NOTE — Progress Notes (Signed)
?Cardiology Office Note:   ? ?Date:  02/03/2022  ? ?IDMargi [facility], DOB January 07, 1956, MRN [id number] ? ?PCP:  Charlott Rakes, MD ?[place] Cardiologist: Sanda Klein, MD  ? ?Reason for visit: 4-6 week follow-up ? ?History of Present Illness:   ? ?[facility] is a 66 y.o. female with a hx of hypertension, diabetes, hyperlipidemia, left breast cancer status postlumpectomy and chemotherapy without radiation, aortic and coronary arteries atherosclerosis on chest CT, moderate nonobstructive coronary disease on left heart cath 2014, pericarditis in 2018. ?  ?She was last seen by Dr. Sallyanne Kuster in September 2022.  She complained of palpitations occurring 3-4 times a week associated with shortness of breath.  She wore a Zio patch showing no arrhythmias, less than 1% PACs and PVCs.  She also had high blood pressure with systolic blood pressures [phone].  Lisinopril was increased to 20 mg daily. ?  ?She was admitted from December 27 to December 29 with abdominal discomfort, chest pain and palpitations.  EKG showed sinus bradycardia, heart rate 50s and 60s. EKG, telemetry show sinus rhythm - no arrhythmias to correlate with symptoms while on tele.  CXR showed no acute cardiopulmonary disease. TSH normal. CRP, ESR normal so unlikely to be pericarditis.  Echo 11/17/21 showed LVEF 60-65%, no LV wall motion abnormalities, grade I diastolic dysfunction. ? ?She last saw me on January 06 2022.  She complained of persistent palpitations that occur 4-5 times per day.  We stopped her carvedilol and and trialed Cardizem CD 360 mg once daily. ? ?Today, she states she felt worse on Cardizem CD 360 mg, therefore she split the tablet in half.  She thinks her palpitations are better controlled.  She now only feels palpitations when she is either mad or has a problem.  She has noticed that her blood glucoses have been elevated in the 200s to up to 500.  She states this is unusual for her.  She has needed  to increase her insulin dosing.  She has noticed that she feels more jittery this past month.  TSH WNL in 10/2021.  She denies stress at home but has some stress at work with her hours being cut back.  She denies chest pain and shortness of breath.  She has some dizziness and unbalanceness.   She states systolic blood pressure at home 145-150.  She has a follow-up with her PCP next month. ? ?  ?Past Medical History:  ?Diagnosis Date  ? Abdominal pain   ? Allergy   ? Arthralgia 08/13/2013  ? Bell palsy 2006  ? Bell's palsy   ? Breast cancer ([place]) 2009  ? Left Breast Cancer  ? Cataract   ? Chest pain 09/30/2013  ? Cholelithiasis   ? Chronic sinusitis 05/16/2017  ? Colon cancer screening 10/27/2015  ? Complication of anesthesia   ? " tubal ligation, in [place]"  had weakness, couldnt stand up the next day, was given pills for oxygen for Brain."  1 month after I was having loss of attentiviness, still have to focus  carefully.   ? Coronary artery disease   ? CVA (cerebral vascular accident) ([place]) 03/30/2018  ? Depression   ? Diabetes mellitus   ? Type II  ? Dyspnea   ? at times- when air conditioner is runnimg, heat also   ? Encounter for screening colonoscopy 10/30/2015  ? GERD (gastroesophageal reflux disease)   ? Hepatic steatosis   ? History of breast cancer 06/01/2012  ? HTN (hypertension)   ?  Hx of adenomatous polyp of colon 11/07/2019  ? Hyperlipidemia   ? Internal carotid artery stenosis 04/03/2018  ? Personal history of chemotherapy 2009  ? Left Breast Cancer  ? Personal history of radiation therapy 2009  ? Left Breast Cancer  ? Stroke [facility])   ? 2006, 2015  ? TIA (transient ischemic attack) 02/20/2014  ? ? ?Past Surgical History:  ?Procedure Laterality Date  ? ANKLE ARTHROSCOPY Right 12/14/2016  ? Procedure: Right Ankle Arthroscopic Debridement;  Surgeon: Newt Minion, MD;  Location: [facility];  Service: Orthopedics;  Laterality: Right;  ? Arm surgery    ? Left tendon lengthen  ? BREAST LUMPECTOMY Left 2009  ?  CHOLECYSTECTOMY N/A 12/09/2015  ? Procedure: LAPAROSCOPIC CHOLECYSTECTOMY WITH INTRAOPERATIVE CHOLANGIOGRAM;  Surgeon: Greer Pickerel, MD;  Location: [facility];  Service: General;  Laterality: N/A;  ? LEFT HEART CATHETERIZATION WITH CORONARY ANGIOGRAM N/A 10/30/2013  ? Procedure: LEFT HEART CATHETERIZATION WITH CORONARY ANGIOGRAM;  Surgeon: Josue Hector, MD;  Location: [facility] CATH LAB;  Service: Cardiovascular;  Laterality: N/A;  ? porta cath Right   ? Removal of Porta cath Right   ? TUBAL LIGATION    ? ? ?Current Medications: ?Current Meds  ?Medication Sig  ? aspirin 325 MG EC tablet Take 1 tablet (325 mg total) by mouth daily.  ? cyclobenzaprine (FLEXERIL) 10 MG tablet Take 1 tablet (10 mg total) by mouth every 8 (eight) hours as needed for muscle spasms.  ? dicyclomine (BENTYL) 20 MG tablet Take 1 tablet (20 mg total) by mouth 2 (two) times daily.  ? diltiazem (CARDIZEM CD) 180 MG 24 hr capsule Take 1 capsule (180 mg total) by mouth daily.  ? glipiZIDE (GLUCOTROL) 10 MG tablet TAKE 1 TABLET (10 MG TOTAL) BY MOUTH 2 (TWO) TIMES DAILY BEFORE A MEAL. (Patient taking differently: Take 10 mg by mouth 2 (two) times daily before a meal.)  ? Insulin Glargine (BASAGLAR KWIKPEN) 100 UNIT/ML Inject 5 Units into the skin at bedtime.  ? Insulin Pen Needle (TECHLITE PEN NEEDLES) 32G X 4 MM MISC use to inject lantus daily  ? lisinopril (ZESTRIL) 20 MG tablet Take 1 tablet (20 mg total) by mouth daily.  ? meloxicam (MOBIC) 7.5 MG tablet Take 1 tablet (7.5 mg total) by mouth daily.  ? ondansetron (ZOFRAN-ODT) 4 MG disintegrating tablet Take 1 tablet (4 mg total) by mouth every 8 (eight) hours as needed for nausea or vomiting.  ? rizatriptan (MAXALT) 5 MG tablet TAKE 1 TABLET (5 MG TOTAL) BY MOUTH AS NEEDED FOR MIGRAINE. MAY REPEAT IN 2 HOURS IF NEEDED (Patient taking differently: Take 5 mg by mouth as needed for migraine (May repeat in 2 hours if needed.).)  ? rosuvastatin (CRESTOR) 5 MG tablet Take 1 tablet (5 mg total) by mouth daily.  ?  traMADol (ULTRAM) 50 MG tablet Take 1 tablet (50 mg total) by mouth every 8 (eight) hours as needed (for pain and muscle spasms).  ? traZODone (DESYREL) 100 MG tablet TAKE 1 TABLET (100 MG TOTAL) BY MOUTH AT BEDTIME AS NEEDED FOR SLEEP. (Patient taking differently: Take 100 mg by mouth at bedtime as needed for sleep.)  ? [DISCONTINUED] diltiazem (CARDIZEM LA) 360 MG 24 hr tablet Take 1 tablet (360 mg total) by mouth daily.  ?  ? ?Allergies:   Gadolinium derivatives and Iodinated contrast media  ? ?Social History  ? ?Socioeconomic History  ? Marital status: Single  ?  Spouse name: Not on file  ? Number of children: 6  ?  Years of education: Not on file  ? Highest education level: High school graduate  ?Occupational History  ? Not on file  ?Tobacco Use  ? Smoking status: Never  ? Smokeless tobacco: Never  ?Vaping Use  ? Vaping Use: Never used  ?Substance and Sexual Activity  ? Alcohol use: No  ? Drug use: No  ? Sexual activity: Not Currently  ?  Birth control/protection: Surgical  ?Other Topics Concern  ? Not on file  ?Social History Narrative  ? Single with 6 children   ? lives with her daughter  ? Right handed  ? Never smoker no drug use no alcohol  ? ?Social Determinants of Health  ? ?Financial Resource Strain: Not on file  ?Food Insecurity: Not on file  ?Transportation Needs: Not on file  ?Physical Activity: Not on file  ?Stress: Not on file  ?Social Connections: Not on file  ?  ? ?Family History: ?The patient's family history includes Diabetes in her mother; Hypertension in her mother; Migraines in her daughter; Thyroid cancer in her sister. There is no history of Colon cancer, Esophageal cancer, Stomach cancer, or Rectal cancer. ? ?ROS:   ?Please see the history of present illness.    ? ?EKGs/Labs/Other Studies Reviewed:   ? ?Recent Labs: ?11/16/2021: TSH 1.956 ?01/16/2022: ALT 26; BUN 9; Creatinine, Ser 0.64; Hemoglobin 14.4; Platelets 129; Potassium 3.9; Sodium 141  ? ?Recent Lipid Panel ?Lab Results  ?Component  Value Date/Time  ? CHOL 113 12/02/2020 08:37 AM  ? TRIG 107 12/02/2020 08:37 AM  ? HDL 41 12/02/2020 08:37 AM  ? LDLCALC 52 12/02/2020 08:37 AM  ? ? ?Physical Exam:   ? ?VS:  BP 124/74   Pulse 75   Ht 5' (1.524

## 2022-02-03 ENCOUNTER — Encounter: Payer: Self-pay | Admitting: Physician Assistant

## 2022-02-03 ENCOUNTER — Other Ambulatory Visit: Payer: Self-pay

## 2022-02-03 ENCOUNTER — Ambulatory Visit (INDEPENDENT_AMBULATORY_CARE_PROVIDER_SITE_OTHER): Payer: No Typology Code available for payment source | Admitting: Physician Assistant

## 2022-02-03 VITALS — BP 124/74 | HR 75 | Ht 60.0 in | Wt 149.8 lb

## 2022-02-03 DIAGNOSIS — R002 Palpitations: Secondary | ICD-10-CM

## 2022-02-03 DIAGNOSIS — I10 Essential (primary) hypertension: Secondary | ICD-10-CM

## 2022-02-03 DIAGNOSIS — I2510 Atherosclerotic heart disease of native coronary artery without angina pectoris: Secondary | ICD-10-CM

## 2022-02-03 LAB — LIPID PANEL
Chol/HDL Ratio: 2.2 ratio (ref 0.0–4.4)
Cholesterol, Total: 126 mg/dL (ref 100–199)
HDL: 57 mg/dL (ref >39)
LDL Chol Calc (NIH): 52 mg/dL (ref 0–99)
Triglycerides: 92 mg/dL (ref 0–149)
VLDL Cholesterol Cal: 17 mg/dL (ref 5–40)

## 2022-02-03 MED ORDER — DILTIAZEM HCL ER COATED BEADS 180 MG PO CP24
180.0000 mg | ORAL_CAPSULE | Freq: Every day | ORAL | 3 refills | Status: DC
  Filled 2022-02-03: qty 30, 30d supply, fill #0
  Filled 2022-02-21: qty 90, 90d supply, fill #0
  Filled 2022-05-26: qty 30, 30d supply, fill #1
  Filled 2022-06-20: qty 30, 30d supply, fill #2
  Filled 2022-07-15: qty 30, 30d supply, fill #3
  Filled 2022-08-26: qty 30, 30d supply, fill #4
  Filled 2022-09-21: qty 5, 5d supply, fill #5
  Filled 2022-09-21: qty 25, 25d supply, fill #5
  Filled 2022-10-28: qty 30, 30d supply, fill #6
  Filled 2022-11-22: qty 30, 30d supply, fill #7
  Filled 2022-12-28: qty 30, 30d supply, fill #8

## 2022-02-03 NOTE — Patient Instructions (Signed)
Medication Instructions:  ?Decrease Cardizem to 180 mg ( Take 1 Tablet Daily). ?Take Lisinopril at Night. ?*If you need a refill on your cardiac medications before your next appointment, please call your pharmacy* ? ? ?Lab Work: ?Lipid Panel. Today ?If you have labs (blood work) drawn today and your tests are completely normal, you will receive your results only by: ?MyChart Message (if you have MyChart) OR ?A paper copy in the mail ?If you have any lab test that is abnormal or we need to change your treatment, we will call you to review the results. ? ? ?Testing/Procedures: ?No Testing ? ? ?Follow-Up: ?At [facility], you and your health needs are our priority.  As part of our continuing mission to provide you with exceptional heart care, we have created designated Provider Care Teams.  These Care Teams include your primary Cardiologist (physician) and Advanced Practice Providers (APPs -  Physician Assistants and Nurse Practitioners) who all work together to provide you with the care you need, when you need it. ? ?We recommend signing up for the patient portal called "MyChart".  Sign up information is provided on this After Visit Summary.  MyChart is used to connect with patients for Virtual Visits (Telemedicine).  Patients are able to view lab/test results, encounter notes, upcoming appointments, etc.  Non-urgent messages can be sent to your provider as well.   ?To learn more about what you can do with MyChart, go to [email or web address].   ? ?Your next appointment:   ?September 2023 ? ?The format for your next appointment:   ?In Person ? ?Provider:   ?Sanda Klein, MD   ? ? ?  ? ?

## 2022-02-09 ENCOUNTER — Telehealth: Payer: Self-pay

## 2022-02-09 NOTE — Telephone Encounter (Addendum)
Called patient regarding results. Unable to leave message voicemail not set up.----- Message from Warren Lacy, PA-C sent at 02/08/2022 12:25 PM EDT ----- ?Continued superb control of your cholesterol.  Bad cholesterol low at 52; good cholesterol and triglycerides at goal.  Continue current medications. ?

## 2022-02-10 ENCOUNTER — Other Ambulatory Visit: Payer: Self-pay

## 2022-02-11 ENCOUNTER — Telehealth: Payer: Self-pay

## 2022-02-11 NOTE — Telephone Encounter (Addendum)
Called patient regarding results. Unable to leave voicemail. Letter mailed out on 02/11/22.----- Message from Warren Lacy, PA-C sent at 02/08/2022 12:25 PM EDT ----- ?Continued superb control of your cholesterol.  Bad cholesterol low at 52; good cholesterol and triglycerides at goal.  Continue current medications. ?

## 2022-02-21 ENCOUNTER — Other Ambulatory Visit: Payer: Self-pay

## 2022-03-07 ENCOUNTER — Encounter (HOSPITAL_COMMUNITY): Payer: Self-pay

## 2022-03-07 ENCOUNTER — Emergency Department (HOSPITAL_COMMUNITY): Payer: No Typology Code available for payment source

## 2022-03-07 ENCOUNTER — Emergency Department (HOSPITAL_COMMUNITY)
Admission: EM | Admit: 2022-03-07 | Discharge: 2022-03-08 | Disposition: A | Discharge status: Home / Self Care | Payer: No Typology Code available for payment source | Attending: Emergency Medicine | Admitting: Emergency Medicine

## 2022-03-07 DIAGNOSIS — Z7982 Long term (current) use of aspirin: Secondary | ICD-10-CM | POA: Insufficient documentation

## 2022-03-07 DIAGNOSIS — Z794 Long term (current) use of insulin: Secondary | ICD-10-CM | POA: Insufficient documentation

## 2022-03-07 DIAGNOSIS — Z7984 Long term (current) use of oral hypoglycemic drugs: Secondary | ICD-10-CM | POA: Insufficient documentation

## 2022-03-07 DIAGNOSIS — E119 Type 2 diabetes mellitus without complications: Secondary | ICD-10-CM | POA: Insufficient documentation

## 2022-03-07 DIAGNOSIS — Z79899 Other long term (current) drug therapy: Secondary | ICD-10-CM | POA: Insufficient documentation

## 2022-03-07 DIAGNOSIS — L03032 Cellulitis of left toe: Secondary | ICD-10-CM | POA: Insufficient documentation

## 2022-03-07 LAB — CBC WITH DIFFERENTIAL/PLATELET
Abs Immature Granulocytes: 0.02 10*3/uL (ref 0.00–0.07)
Basophils Absolute: 0 10*3/uL (ref 0.0–0.1)
Basophils Relative: 1 %
Eosinophils Absolute: 0.5 10*3/uL (ref 0.0–0.5)
Eosinophils Relative: 7 %
HCT: 39.7 % (ref 36.0–46.0)
Hemoglobin: 13.3 g/dL (ref 12.0–15.0)
Immature Granulocytes: 0 %
Lymphocytes Relative: 31 %
Lymphs Abs: 2.2 10*3/uL (ref 0.7–4.0)
MCH: 30 pg (ref 26.0–34.0)
MCHC: 33.5 g/dL (ref 30.0–36.0)
MCV: 89.4 fL (ref 80.0–100.0)
Monocytes Absolute: 0.7 10*3/uL (ref 0.1–1.0)
Monocytes Relative: 10 %
Neutro Abs: 3.5 10*3/uL (ref 1.7–7.7)
Neutrophils Relative %: 51 %
Platelets: 153 10*3/uL (ref 150–400)
RBC: 4.44 MIL/uL (ref 3.87–5.11)
RDW: 12.3 % (ref 11.5–15.5)
WBC: 6.9 10*3/uL (ref 4.0–10.5)
nRBC: 0 % (ref 0.0–0.2)

## 2022-03-07 MED ORDER — OXYCODONE-ACETAMINOPHEN 5-325 MG PO TABS
1.0000 | ORAL_TABLET | Freq: Once | ORAL | Status: AC
  Administered 2022-03-08: 1 via ORAL
  Filled 2022-03-07: qty 1

## 2022-03-07 NOTE — ED Triage Notes (Signed)
Pt states that she has been having pain in her L foot for the past month, redness to great toe with drainage and fever yesterday, pt is diabetic  ?

## 2022-03-07 NOTE — ED Provider Triage Note (Signed)
Emergency Medicine Provider Triage Evaluation Note ? ?Brandi Bryant , a 66 y.o. female  was evaluated in triage.  Pt complains of progressively worsening pain and redness to the left great toe x 1 month. Reports some pus draining from it. Also notes subjective fever yesterday. ? ?Review of Systems  ?Positive: Arthralgia, subjective fever ?Negative: Chills, vomiting ? ?Physical Exam  ?BP (!) 171/81   Pulse 78   Temp 98.6 ?F (37 ?C) (Oral)   Resp 18   SpO2 96%  ?Gen:   Awake, no distress   ?Resp:  Normal effort  ?MSK:  TTP to the left great toe.   ?Other: 2+ DP pulses.  ? ?Medical Decision Making  ?Medically screening exam initiated at 10:25 PM.  Appropriate orders placed.  Brandi Bryant was informed that the remainder of the evaluation will be completed by another provider, this initial triage assessment does not replace that evaluation, and the importance of remaining in the ED until their evaluation is complete. ? ?Interpretor utilized throughout Sales executive.  ? ?Labs & xray ordered ? ?Great toe pain.  ?  ?Amaryllis Dyke, PA-C ?03/07/22 2228 ? ?

## 2022-03-08 ENCOUNTER — Other Ambulatory Visit: Payer: Self-pay

## 2022-03-08 LAB — BASIC METABOLIC PANEL
Anion gap: 7 (ref 5–15)
BUN: 12 mg/dL (ref 8–23)
CO2: 22 mmol/L (ref 22–32)
Calcium: 8.9 mg/dL (ref 8.9–10.3)
Chloride: 108 mmol/L (ref 98–111)
Creatinine, Ser: 0.8 mg/dL (ref 0.44–1.00)
GFR, Estimated: >60 mL/min (ref >60)
Glucose, Bld: 264 mg/dL — ABNORMAL HIGH (ref 70–99)
Potassium: 4 mmol/L (ref 3.5–5.1)
Sodium: 137 mmol/L (ref 135–145)

## 2022-03-08 MED ORDER — SULFAMETHOXAZOLE-TRIMETHOPRIM 800-160 MG PO TABS
1.0000 | ORAL_TABLET | Freq: Once | ORAL | Status: AC
  Administered 2022-03-08: 1 via ORAL
  Filled 2022-03-08: qty 1

## 2022-03-08 MED ORDER — GABAPENTIN 100 MG PO CAPS
100.0000 mg | ORAL_CAPSULE | Freq: Three times a day (TID) | ORAL | 0 refills | Status: DC
  Filled 2022-03-08: qty 21, 7d supply, fill #0

## 2022-03-08 MED ORDER — GABAPENTIN 100 MG PO CAPS
100.0000 mg | ORAL_CAPSULE | Freq: Once | ORAL | Status: AC
  Administered 2022-03-08: 100 mg via ORAL
  Filled 2022-03-08: qty 1

## 2022-03-08 MED ORDER — SULFAMETHOXAZOLE-TRIMETHOPRIM 800-160 MG PO TABS
1.0000 | ORAL_TABLET | Freq: Two times a day (BID) | ORAL | 0 refills | Status: AC
  Filled 2022-03-08: qty 14, 7d supply, fill #0

## 2022-03-08 MED ORDER — ONDANSETRON 4 MG PO TBDP
4.0000 mg | ORAL_TABLET | Freq: Once | ORAL | Status: AC
  Administered 2022-03-08: 4 mg via ORAL
  Filled 2022-03-08: qty 1

## 2022-03-08 NOTE — ED Provider Notes (Signed)
?[facility] ?Provider Note ? ? ?CSN: [id number] ?Arrival date & time: 03/07/22  2214 ? ?  ? ?History ? ?Chief Complaint  ?Patient presents with  ? Foot Pain  ? ? ?Brandi Bryant is a 66 y.o. female. ? ?HPI ?With history of diabetes, A-fib presents with concern for toe pain.  Pain is intermittently bilateral in the great toes, but is primarily in the left toe which has been pain for about 1 month.  There is associated redness, possible drainage from the lateral aspect of the toe.  No fever, chills, chest pain, abdominal pain, nausea or other complaints.  She notes that she has had minimal relief with ibuprofen. ?  ? ?Home Medications ?Prior to Admission medications   ?Medication Sig Start Date End Date Taking? Authorizing Provider  ?aspirin 325 MG EC tablet Take 1 tablet (325 mg total) by mouth daily. 06/14/18   Argentina Donovan, PA-C  ?cyclobenzaprine (FLEXERIL) 10 MG tablet Take 1 tablet (10 mg total) by mouth every 8 (eight) hours as needed for muscle spasms. 08/13/21   Croitoru, Mihai, MD  ?dicyclomine (BENTYL) 20 MG tablet Take 1 tablet (20 mg total) by mouth 2 (two) times daily. 01/16/22   Tacy Learn, PA-C  ?diltiazem (CARDIZEM CD) 180 MG 24 hr capsule Take 1 capsule (180 mg total) by mouth daily. 02/03/22 05/22/22  Warren Lacy, PA-C  ?glipiZIDE (GLUCOTROL) 10 MG tablet TAKE 1 TABLET (10 MG TOTAL) BY MOUTH 2 (TWO) TIMES DAILY BEFORE A MEAL. ?Patient taking differently: Take 10 mg by mouth 2 (two) times daily before a meal. 08/03/21 08/03/22  Charlott Rakes, MD  ?Insulin Glargine (BASAGLAR KWIKPEN) 100 UNIT/ML Inject 5 Units into the skin at bedtime. 12/01/21   Charlott Rakes, MD  ?Insulin Pen Needle (TECHLITE PEN NEEDLES) 32G X 4 MM MISC use to inject lantus daily 03/08/21     ?lisinopril (ZESTRIL) 20 MG tablet Take 1 tablet (20 mg total) by mouth daily. 08/13/21 08/13/22  Croitoru, Mihai, MD  ?meloxicam (MOBIC) 7.5 MG tablet Take 1 tablet (7.5 mg total) by  mouth daily. 08/03/21   Charlott Rakes, MD  ?ondansetron (ZOFRAN-ODT) 4 MG disintegrating tablet Take 1 tablet (4 mg total) by mouth every 8 (eight) hours as needed for nausea or vomiting. 01/16/22   Tacy Learn, PA-C  ?rizatriptan (MAXALT) 5 MG tablet TAKE 1 TABLET (5 MG TOTAL) BY MOUTH AS NEEDED FOR MIGRAINE. MAY REPEAT IN 2 HOURS IF NEEDED ?Patient taking differently: Take 5 mg by mouth as needed for migraine (May repeat in 2 hours if needed.). 05/13/21 05/13/22  Mayers, Cari S, PA-C  ?rosuvastatin (CRESTOR) 5 MG tablet Take 1 tablet (5 mg total) by mouth daily. 12/01/21 03/23/22  Charlott Rakes, MD  ?traMADol (ULTRAM) 50 MG tablet Take 1 tablet (50 mg total) by mouth every 8 (eight) hours as needed (for pain and muscle spasms). 08/13/21   Croitoru, Mihai, MD  ?traZODone (DESYREL) 100 MG tablet TAKE 1 TABLET (100 MG TOTAL) BY MOUTH AT BEDTIME AS NEEDED FOR SLEEP. ?Patient taking differently: Take 100 mg by mouth at bedtime as needed for sleep. 05/13/21 05/13/22  Mayers, Cari S, PA-C  ?escitalopram (LEXAPRO) 10 MG tablet Take 10 mg by mouth daily.  01/31/12  [provider]  ?   ? ?Allergies    ?Gadolinium derivatives and Iodinated contrast media   ? ?Review of Systems   ?Review of Systems  ?Constitutional:   ?     Per HPI, otherwise negative  ?  HENT:    ?     Per HPI, otherwise negative  ?Respiratory:    ?     Per HPI, otherwise negative  ?Cardiovascular:   ?     Per HPI, otherwise negative  ?Gastrointestinal:  Negative for vomiting.  ?Endocrine:  ?     Negative aside from HPI  ?Genitourinary:   ?     Neg aside from HPI   ?Musculoskeletal:   ?     Per HPI, otherwise negative  ?Skin:  Positive for color change.  ?Neurological:  Negative for syncope.  ? ?Physical Exam ?Updated Vital Signs ?BP 139/68 (BP Location: Left Arm)   Pulse (!) 58   Temp 98.6 ?F (37 ?C) (Oral)   Resp 18   SpO2 97%  ?Physical Exam ?Vitals and nursing note reviewed.  ?Constitutional:   ?   General: She is not in acute distress. ?    Appearance: She is well-developed.  ?HENT:  ?   Head: Normocephalic and atraumatic.  ?Eyes:  ?   Conjunctiva/sclera: Conjunctivae normal.  ?Cardiovascular:  ?   Rate and Rhythm: Normal rate and regular rhythm.  ?   Comments: DP, PT pulses 2+ in both feet, cap refill appropriate in both feet ?Pulmonary:  ?   Effort: Pulmonary effort is normal. No respiratory distress.  ?   Breath sounds: Normal breath sounds. No stridor.  ?Abdominal:  ?   General: There is no distension.  ?Musculoskeletal:  ?     Legs: ? ?Skin: ?   General: Skin is warm and dry.  ?Neurological:  ?   Mental Status: She is alert and oriented to person, place, and time.  ?   Cranial Nerves: No cranial nerve deficit.  ?Psychiatric:     ?   Mood and Affect: Mood normal.  ? ? ?ED Results / Procedures / Treatments   ?Labs ?(all labs ordered are listed, but only abnormal results are displayed) ?Labs Reviewed  ?BASIC METABOLIC PANEL - Abnormal; Notable for the following components:  ?    Result Value  ? Glucose, Bld 264 (*)   ? All other components within normal limits  ?CBC WITH DIFFERENTIAL/PLATELET  ? ? ?EKG ?None ? ?Radiology ?DG Foot Complete Left ? ?Result Date: 03/07/2022 ?CLINICAL DATA:  Pain and erythema left great toe for 1 month EXAM: LEFT FOOT - COMPLETE 3+ VIEW COMPARISON:  None. FINDINGS: Frontal, oblique, and lateral views of the left foot are obtained. Oblique view of the left first digit also obtained. No acute fracture, subluxation, or dislocation. Joint spaces are well preserved. There is soft tissue swelling of the first digit. No radiopaque foreign body or subcutaneous gas. No bony destruction to suggest osteomyelitis. IMPRESSION: 1. Soft tissue swelling first digit. 2. No acute or destructive bony lesion.  No radiopaque foreign body. Electronically Signed   By: Randa Ngo M.D.   On: 03/07/2022 23:00   ? ?Procedures ?Procedures  ? ? ?Medications Ordered in ED ?Medications  ?oxyCODONE-acetaminophen (PERCOCET/ROXICET) 5-325 MG per tablet  1 tablet (has no administration in time range)  ? ? ?ED Course/ Medical Decision Making/ A&P ? ?This patient with a Hx of diabetes presents to the ED for concern of bilateral great toe pain, left greater than right, this involves an extensive number of treatment options, and is a complaint that carries with it a high risk of complications and morbidity.   ? ?The differential diagnosis includes neuropathy, cellulitis, paronychia, bacteremia, sepsis ? ? ?Social Determinants of Health: ? ?Immigrant  from [place] about 21 years ago ? ?Additional history obtained: ? ?Additional history and/or information obtained from chart review, notable for evaluation last month for atrial fibrillation ? ? ?After the initial evaluation, orders, including: X-ray, labs from triage were initiated. ? ? ?Patient placed on Cardiac and Pulse-Oximetry Monitors. ?The patient was maintained on a cardiac monitor.  The cardiac monitored showed an rhythm of sinus 60 normal ?The patient was also maintained on pulse oximetry. The readings were typically 99% room air normal ? ? ?On repeat evaluation of the patient stayed the same ? ?Lab Tests: ? ?I personally interpreted labs.  The pertinent results include: Hyperglycemia, no evidence for DKA ? ?Imaging Studies ordered: ? ?I independently visualized and interpreted imaging which showed swelling left great toe ?I agree with the radiologist interpretation ? ? ?Dispostion / Final MDM: ? ?After consideration of the diagnostic results and the patient's response to treatment, she will be started on antibiotics, analgesics, follow-up with our toe/foot clinic.  No evidence for bacteremia, sepsis, some suspicion for the patient's hyperglycemia contributing neuropathy playing a role, but greater suspicion for paronychia contributing to pain.  No evidence for deeper infection, as above, bacteremia, sepsis. ? ?Final Clinical Impression(s) / ED Diagnoses ?Final diagnoses:  ?Paronychia of great toe of left foot   ? ? ?Rx / DC Orders ?ED Discharge Orders   ? ?      Ordered  ?  gabapentin (NEURONTIN) 100 MG capsule  3 times daily       ? 03/08/22 0840  ?  sulfamethoxazole-trimethoprim (BACTRIM DS) 800-160 MG tablet  2 time

## 2022-03-08 NOTE — Discharge Instructions (Addendum)
As discussed, you have an infection of your toe.  It is important to take your antibiotics as prescribed and schedule follow-up with our foot specialists.  Return here for concerning changes in your condition.  In addition to the antibiotics, please use Epsom salt soaks, 3 times daily.  This will aid in the healing process. ? ?Como se mencion?, usted tiene una infecci?n en el dedo del pie. Es importante que tome sus antibi?ticos seg?n lo recetado y programe un seguimiento con nuestros especialistas en pies. Regrese aqu? para Lobbyist en su condici?n. Adem?s de los antibi?ticos, use ba?os de sal de Epsom, 3 veces al d?a. Angelena Sole ayudar? en el proceso de curaci?n. ?

## 2022-03-10 ENCOUNTER — Ambulatory Visit: Payer: No Typology Code available for payment source | Attending: Family Medicine | Admitting: Family Medicine

## 2022-03-10 ENCOUNTER — Other Ambulatory Visit: Payer: Self-pay

## 2022-03-10 ENCOUNTER — Encounter: Payer: Self-pay | Admitting: Family Medicine

## 2022-03-10 VITALS — BP 128/61 | HR 67 | Ht 60.0 in | Wt 150.0 lb

## 2022-03-10 DIAGNOSIS — M5432 Sciatica, left side: Secondary | ICD-10-CM

## 2022-03-10 DIAGNOSIS — M5431 Sciatica, right side: Secondary | ICD-10-CM

## 2022-03-10 DIAGNOSIS — L600 Ingrowing nail: Secondary | ICD-10-CM

## 2022-03-10 DIAGNOSIS — Z794 Long term (current) use of insulin: Secondary | ICD-10-CM

## 2022-03-10 DIAGNOSIS — E1169 Type 2 diabetes mellitus with other specified complication: Secondary | ICD-10-CM

## 2022-03-10 DIAGNOSIS — M542 Cervicalgia: Secondary | ICD-10-CM

## 2022-03-10 DIAGNOSIS — K5909 Other constipation: Secondary | ICD-10-CM

## 2022-03-10 DIAGNOSIS — E1140 Type 2 diabetes mellitus with diabetic neuropathy, unspecified: Secondary | ICD-10-CM

## 2022-03-10 LAB — POCT GLYCOSYLATED HEMOGLOBIN (HGB A1C): HbA1c, POC (controlled diabetic range): 6.9 % (ref 0.0–7.0)

## 2022-03-10 LAB — GLUCOSE, POCT (MANUAL RESULT ENTRY): POC Glucose: 305 mg/dl — AB (ref 70–99)

## 2022-03-10 MED ORDER — TRULICITY 0.75 MG/0.5ML ~~LOC~~ SOAJ
0.7500 mg | SUBCUTANEOUS | 6 refills | Status: DC
  Filled 2022-03-10: qty 2, 28d supply, fill #0
  Filled 2022-04-06: qty 2, 28d supply, fill #1
  Filled 2022-05-06: qty 2, 28d supply, fill #2
  Filled 2022-06-08: qty 2, 28d supply, fill #3

## 2022-03-10 MED ORDER — POLYETHYLENE GLYCOL 3350 17 GM/SCOOP PO POWD
17.0000 g | Freq: Every day | ORAL | 1 refills | Status: DC
Start: 2022-03-10 — End: 2023-04-11
  Filled 2022-03-10: qty 510, 30d supply, fill #0

## 2022-03-10 MED ORDER — CYCLOBENZAPRINE HCL 10 MG PO TABS
10.0000 mg | ORAL_TABLET | Freq: Three times a day (TID) | ORAL | 1 refills | Status: DC | PRN
  Filled 2022-03-10: qty 60, 20d supply, fill #0

## 2022-03-10 MED ORDER — GLIPIZIDE 5 MG PO TABS
5.0000 mg | ORAL_TABLET | Freq: Two times a day (BID) | ORAL | 6 refills | Status: DC
  Filled 2022-03-10: qty 60, 30d supply, fill #0
  Filled 2022-04-06: qty 60, 30d supply, fill #1
  Filled 2022-05-06: qty 60, 30d supply, fill #2

## 2022-03-10 NOTE — Patient Instructions (Signed)
Estreimiento, en adultos Constipation, Adult Estreimiento significa que una persona hace menos de tres deposiciones en una semana, tiene dificultades para defecar o las heces (deposiciones) son secas, duras o ms grandes de lo normal. La causa puede ser una afeccin subyacente. Puede empeorar con la edad si una persona toma ciertos medicamentos y no toma suficiente lquido. Siga estas instrucciones en su casa: Comida y bebida  Consuma alimentos con alto contenido de fibra, como frijoles, cereales integrales, y frutas y verduras frescas. Limite el consumo de alimentos que sean bajos en fibra y ricos en grasas y azcares procesados, como los fritos y los dulces. Estos incluyen patatas fritas, hamburguesas, galletas, dulces y refrescos. Beba suficiente lquido como para mantener la orina de color amarillo plido. Instrucciones generales Haga actividad fsica habitualmente o como se lo haya indicado el mdico. Intente practicar 150 minutos de actividad fsica moderada por semana. Vaya al bao siempre que sienta ganas de defecar. No se aguante las ganas. Use los medicamentos de venta libre y los recetados solamente como se lo haya indicado el mdico. Esto incluye suplementos de fibra. En el momento de hacer sus deposiciones: Respire profundamente mientras relaja la parte inferior del abdomen. Practique la relajacin del suelo plvico. Controle su afeccin para detectar cualquier cambio. Informe a su mdico acerca de cualquier cambio. Concurra a todas las visitas de seguimiento como se lo haya indicado el mdico. Esto es importante. Comunquese con un mdico si: Su dolor empeora. Tiene fiebre. No defeca despus de 4 das. Vomita. Baja de peso o no tiene apetito. Tiene sangrado por la abertura entre las nalgas (ano). Las heces son delgadas como un lpiz. Solicite ayuda de inmediato si: Tiene fiebre, y los sntomas empeoran repentinamente. Observa que se filtran heces o hay sangre en las  heces. Tiene el abdomen distendido. Siente un dolor intenso en el abdomen. Se siente mareado o se desmaya. Resumen Estreimiento significa que una persona hace menos de tres deposiciones en una semana, tiene dificultades para defecar o las heces (deposiciones) son secas, duras o ms grandes de lo normal. Consuma alimentos con alto contenido de fibra, como frijoles, cereales integrales, y frutas y verduras frescas. Beba suficiente lquido como para mantener la orina de color amarillo plido. Use los medicamentos de venta libre y los recetados solamente como se lo haya indicado el mdico. Esto incluye suplementos de fibra. Esta informacin no tiene como fin reemplazar el consejo del mdico. Asegrese de hacerle al mdico cualquier pregunta que tenga. Document Revised: 12/13/2019 Document Reviewed: 12/13/2019 Elsevier Patient Education  2023 Elsevier Inc.  

## 2022-03-10 NOTE — Progress Notes (Signed)
Constipation. ?Pain in feet. ? ?

## 2022-03-10 NOTE — Progress Notes (Signed)
? ?Subjective:  ?Patient ID: Brandi Bryant, female    DOB: 02/20/1956  Age: 66 y.o. MRN: [id number] ? ?CC: Diabetes ? ? ?HPI ?Brandi Bryant is a 66 y.o. year old female with a history of GERD, hypertension, hyperlipidemia, breast cancer status post left lumpectomy and radiation, type 2 diabetes mellitus (A1c 6.9) previous CVA, TIA in 03/2018 ? ?Interval History: ?She had a visit with cardiology last month for palpitations and notes reviewed.  Zio patch revealed no evidence of arrhythmias per notes and her Cardizem dose was decreased from 360 mg down to 180 mg. ? ? ?Her Basaglar dose had been decreased at her last visit from 15 units twice daily down to 5 units nightly due to decreased need for the [place].  Blood sugar in the clinic is 305. ?Sugars at home have been around 120, 130 fasting but random sugars are elevated to abou 220-300.  She does have abnormal vision and had been referred to ophthalmology at her last visit. ? ?She complains of pain in her great toenails for which she was seen in the ED for this 2 days ago and placed on Bactrim. She has an upcoming appointment with Podiatry on 4/28 ?Also complains of constipation for the last month and endorses ingesting eating a lot of cheese and eats bread once in a while. ?She has pain in her posterior lower buttock bilaterally which is worse when she sits to use the bathroom. ? ? ?Past Medical History:  ?Diagnosis Date  ? Abdominal pain   ? Allergy   ? Arthralgia 08/13/2013  ? Bell palsy 2006  ? Bell's palsy   ? Breast cancer ([place]) 2009  ? Left Breast Cancer  ? Cataract   ? Chest pain 09/30/2013  ? Cholelithiasis   ? Chronic sinusitis 05/16/2017  ? Colon cancer screening 10/27/2015  ? Complication of anesthesia   ? " tubal ligation, in [place]"  had weakness, couldnt stand up the next day, was given pills for oxygen for Brain."  1 month after I was having loss of attentiviness, still have to focus  carefully.   ? Coronary artery disease   ? CVA  (cerebral vascular accident) ([place]) 03/30/2018  ? Depression   ? Diabetes mellitus   ? Type II  ? Dyspnea   ? at times- when air conditioner is runnimg, heat also   ? Encounter for screening colonoscopy 10/30/2015  ? GERD (gastroesophageal reflux disease)   ? Hepatic steatosis   ? History of breast cancer 06/01/2012  ? HTN (hypertension)   ? Hx of adenomatous polyp of colon 11/07/2019  ? Hyperlipidemia   ? Internal carotid artery stenosis 04/03/2018  ? Personal history of chemotherapy 2009  ? Left Breast Cancer  ? Personal history of radiation therapy 2009  ? Left Breast Cancer  ? Stroke [facility])   ? 2006, 2015  ? TIA (transient ischemic attack) 02/20/2014  ? ? ?Past Surgical History:  ?Procedure Laterality Date  ? ANKLE ARTHROSCOPY Right 12/14/2016  ? Procedure: Right Ankle Arthroscopic Debridement;  Surgeon: Newt Minion, MD;  Location: [place];  Service: Orthopedics;  Laterality: Right;  ? Arm surgery    ? Left tendon lengthen  ? BREAST LUMPECTOMY Left 2009  ? CHOLECYSTECTOMY N/A 12/09/2015  ? Procedure: LAPAROSCOPIC CHOLECYSTECTOMY WITH INTRAOPERATIVE CHOLANGIOGRAM;  Surgeon: Greer Pickerel, MD;  Location: [place];  Service: General;  Laterality: N/A;  ? LEFT HEART CATHETERIZATION WITH CORONARY ANGIOGRAM N/A 10/30/2013  ? Procedure: LEFT HEART CATHETERIZATION WITH CORONARY ANGIOGRAM;  Surgeon: Collier Salina  Tommy Rainwater, MD;  Location: [place] CATH LAB;  Service: Cardiovascular;  Laterality: N/A;  ? porta cath Right   ? Removal of Porta cath Right   ? TUBAL LIGATION    ? ? ?Family History  ?Problem Relation Age of Onset  ? Thyroid cancer Sister   ? Hypertension Mother   ? Diabetes Mother   ? Migraines Daughter   ? Colon cancer Neg Hx   ? Esophageal cancer Neg Hx   ? Stomach cancer Neg Hx   ? Rectal cancer Neg Hx   ? ? ?Social History  ? ?Socioeconomic History  ? Marital status: Single  ?  Spouse name: Not on file  ? Number of children: 6  ? Years of education: Not on file  ? Highest education level: High school graduate  ?Occupational History  ?  Not on file  ?Tobacco Use  ? Smoking status: Never  ? Smokeless tobacco: Never  ?Vaping Use  ? Vaping Use: Never used  ?Substance and Sexual Activity  ? Alcohol use: No  ? Drug use: No  ? Sexual activity: Not Currently  ?  Birth control/protection: Surgical  ?Other Topics Concern  ? Not on file  ?Social History Narrative  ? Single with 6 children   ? lives with her daughter  ? Right handed  ? Never smoker no drug use no alcohol  ? ?Social Determinants of Health  ? ?Financial Resource Strain: Not on file  ?Food Insecurity: Not on file  ?Transportation Needs: Not on file  ?Physical Activity: Not on file  ?Stress: Not on file  ?Social Connections: Not on file  ? ? ?Allergies  ?Allergen Reactions  ? Gadolinium Derivatives Nausea And Vomiting  ?  Code: VOM, Desc: Pt began vomiting 45 sec after MRI contrast injection of Multihance, Onset Date: [id number] ? ?  ? Iodinated Contrast Media Nausea And Vomiting  ? ? ?Outpatient Medications Prior to Visit  ?Medication Sig Dispense Refill  ? aspirin 325 MG EC tablet Take 1 tablet (325 mg total) by mouth daily. 30 tablet 6  ? diltiazem (CARDIZEM CD) 180 MG 24 hr capsule Take 1 capsule (180 mg total) by mouth daily. 90 capsule 3  ? gabapentin (NEURONTIN) 100 MG capsule Take 1 capsule (100 mg total) by mouth 3 (three) times daily. 21 capsule 0  ? Insulin Glargine (BASAGLAR KWIKPEN) 100 UNIT/ML Inject 5 Units into the skin at bedtime. 15 mL 2  ? Insulin Pen Needle (TECHLITE PEN NEEDLES) 32G X 4 MM MISC use to inject lantus daily 100 each 0  ? lisinopril (ZESTRIL) 20 MG tablet Take 1 tablet (20 mg total) by mouth daily. 30 tablet 11  ? meloxicam (MOBIC) 7.5 MG tablet Take 1 tablet (7.5 mg total) by mouth daily. 30 tablet 1  ? ondansetron (ZOFRAN-ODT) 4 MG disintegrating tablet Take 1 tablet (4 mg total) by mouth every 8 (eight) hours as needed for nausea or vomiting. 12 tablet 0  ? rizatriptan (MAXALT) 5 MG tablet TAKE 1 TABLET (5 MG TOTAL) BY MOUTH AS NEEDED FOR MIGRAINE. MAY REPEAT IN  2 HOURS IF NEEDED (Patient taking differently: Take 5 mg by mouth as needed for migraine (May repeat in 2 hours if needed.).) 10 tablet 3  ? rosuvastatin (CRESTOR) 5 MG tablet Take 1 tablet (5 mg total) by mouth daily. 30 tablet 2  ? sulfamethoxazole-trimethoprim (BACTRIM DS) 800-160 MG tablet Take 1 tablet by mouth 2 (two) times daily for 7 days. 14 tablet 0  ? traMADol (ULTRAM)  50 MG tablet Take 1 tablet (50 mg total) by mouth every 8 (eight) hours as needed (for pain and muscle spasms). 30 tablet 0  ? traZODone (DESYREL) 100 MG tablet TAKE 1 TABLET (100 MG TOTAL) BY MOUTH AT BEDTIME AS NEEDED FOR SLEEP. (Patient taking differently: Take 100 mg by mouth at bedtime as needed for sleep.) 30 tablet 3  ? cyclobenzaprine (FLEXERIL) 10 MG tablet Take 1 tablet (10 mg total) by mouth every 8 (eight) hours as needed for muscle spasms. 60 tablet 0  ? dicyclomine (BENTYL) 20 MG tablet Take 1 tablet (20 mg total) by mouth 2 (two) times daily. 20 tablet 0  ? glipiZIDE (GLUCOTROL) 10 MG tablet TAKE 1 TABLET (10 MG TOTAL) BY MOUTH 2 (TWO) TIMES DAILY BEFORE A MEAL. (Patient taking differently: Take 10 mg by mouth 2 (two) times daily before a meal.) 60 tablet 6  ? ?No facility-administered medications prior to visit.  ? ? ? ?ROS ?Review of Systems  ?Constitutional:  Negative for activity change and appetite change.  ?HENT:  Negative for sinus pressure and sore throat.   ?Respiratory:  Negative for chest tightness, shortness of breath and wheezing.   ?Cardiovascular:  Negative for chest pain and palpitations.  ?Gastrointestinal:  Positive for constipation. Negative for abdominal distention and abdominal pain.  ?Genitourinary: Negative.   ?Musculoskeletal:   ?     See HPI  ?Psychiatric/Behavioral:  Negative for behavioral problems and dysphoric mood.   ? ?Objective:  ?BP 128/61   Pulse 67   Ht 5' (1.524 m)   Wt 150 lb (68 kg)   SpO2 97%   BMI 29.29 kg/m?  ? ? ?  03/10/2022  ?  9:31 AM 03/08/2022  ? 10:00 AM 03/08/2022  ?  9:45 AM   ?BP/Weight  ?Systolic BP [phone]  ?Diastolic BP 61 60 59  ?Wt. (Lbs) 150    ?BMI 29.29 kg/m2    ? ? ? ? ?Physical Exam ?Constitutional:   ?   Appearance: She is well-developed.  ?Cardiovascular:  ?

## 2022-03-17 ENCOUNTER — Other Ambulatory Visit: Payer: Self-pay

## 2022-03-18 ENCOUNTER — Ambulatory Visit (INDEPENDENT_AMBULATORY_CARE_PROVIDER_SITE_OTHER): Payer: Self-pay | Admitting: Podiatry

## 2022-03-18 DIAGNOSIS — L600 Ingrowing nail: Secondary | ICD-10-CM

## 2022-03-18 NOTE — Patient Instructions (Signed)

## 2022-03-24 ENCOUNTER — Other Ambulatory Visit: Payer: Self-pay

## 2022-03-24 ENCOUNTER — Other Ambulatory Visit: Payer: Self-pay | Admitting: Family Medicine

## 2022-03-24 NOTE — Telephone Encounter (Signed)
Requested medication (s) are due for refill today: yes ? ?Requested medication (s) are on the active medication list: yes ? ?Last refill:  01/16/22 #12/0 ? ?Future visit scheduled: yes ? ?Notes to clinic:  Unable to refill per protocol, cannot delegate. Rx was dc'd by another provider.  ? ?  ?Requested Prescriptions  ?Pending Prescriptions Disp Refills  ? ondansetron (ZOFRAN) 4 MG tablet 20 tablet 0  ?  Sig: TAKE 1 TABLET (4 MG TOTAL) BY MOUTH EVERY 8 (EIGHT) HOURS AS NEEDED FOR NAUSEA OR VOMITING.  ?  ? Not Delegated - Gastroenterology: Antiemetics - ondansetron Failed - 03/24/2022  2:25 PM  ?  ?  Failed - This refill cannot be delegated  ?  ?  Passed - AST in normal range and within 360 days  ?  AST  ?Date Value Ref Range Status  ?01/16/2022 35 15 - 41 U/L Final  ?09/30/2013 50 (H) 5 - 34 U/L Final  ?  ?  ?  ?  Passed - ALT in normal range and within 360 days  ?  ALT  ?Date Value Ref Range Status  ?01/16/2022 26 0 - 44 U/L Final  ?09/30/2013 45 0 - 55 U/L Final  ?  ?  ?  ?  Passed - Valid encounter within last 6 months  ?  Recent Outpatient Visits   ? ?      ? 2 weeks ago Type 2 diabetes mellitus with other specified complication, with long-term current use of insulin ([place])  ? [place], Enobong, MD  ? 3 months ago Encounter for screening mammogram for malignant neoplasm of breast  ? [place], Charlane Ferretti, MD  ? 7 months ago Other cirrhosis of liver [facility])  ? [place], Charlane Ferretti, MD  ? 9 months ago Coronary artery disease involving native coronary artery of native heart with other form of angina pectoris [facility])  ? [place] Elsie Stain, MD  ? 1 year ago Vaginal discharge  ? [place] Charlott Rakes, MD  ? ?  ?  ?Future Appointments   ? ?        ? In 2 months Charlott Rakes, MD [place]  ? In 4 months  Croitoru, Mihai, MD [facility]  ? ?  ? ? ?  ?  ?  ? ?

## 2022-03-25 ENCOUNTER — Encounter: Payer: Self-pay | Admitting: Podiatry

## 2022-03-25 NOTE — Progress Notes (Signed)
?Subjective:  ?Patient ID: Brandi Bryant, female    DOB: 05-14-1956,  MRN: [id number] ? ?Chief Complaint  ?Patient presents with  ? Nail Problem  ? ? ?66 y.o. female presents with the above complaint.  Patient presents with complaint of bilateral ingrowing of the nail medial and lateral side with nail dystrophy.  Patient states painful to touch is progressive gotten worse or thicker and is causing her a lot of pain with ambulation.  Pain is 7 out of 10.  She would like to have removed she has not seen anyone else prior to seeing me.  She denies any other acute complaints. ? ? ?Review of Systems: Negative except as noted in the HPI. Denies N/V/F/Ch. ? ?Past Medical History:  ?Diagnosis Date  ? Abdominal pain   ? Allergy   ? Arthralgia 08/13/2013  ? Bell palsy 2006  ? Bell's palsy   ? Breast cancer ([place]) 2009  ? Left Breast Cancer  ? Cataract   ? Chest pain 09/30/2013  ? Cholelithiasis   ? Chronic sinusitis 05/16/2017  ? Colon cancer screening 10/27/2015  ? Complication of anesthesia   ? " tubal ligation, in [place]"  had weakness, couldnt stand up the next day, was given pills for oxygen for Brain."  1 month after I was having loss of attentiviness, still have to focus  carefully.   ? Coronary artery disease   ? CVA (cerebral vascular accident) ([place]) 03/30/2018  ? Depression   ? Diabetes mellitus   ? Type II  ? Dyspnea   ? at times- when air conditioner is runnimg, heat also   ? Encounter for screening colonoscopy 10/30/2015  ? GERD (gastroesophageal reflux disease)   ? Hepatic steatosis   ? History of breast cancer 06/01/2012  ? HTN (hypertension)   ? Hx of adenomatous polyp of colon 11/07/2019  ? Hyperlipidemia   ? Internal carotid artery stenosis 04/03/2018  ? Personal history of chemotherapy 2009  ? Left Breast Cancer  ? Personal history of radiation therapy 2009  ? Left Breast Cancer  ? Stroke [facility])   ? 2006, 2015  ? TIA (transient ischemic attack) 02/20/2014  ? ? ?Current Outpatient Medications:  ?  aspirin  325 MG EC tablet, Take 1 tablet (325 mg total) by mouth daily., Disp: 30 tablet, Rfl: 6 ?  cyclobenzaprine (FLEXERIL) 10 MG tablet, Take 1 tablet (10 mg total) by mouth every 8 (eight) hours as needed for muscle spasms., Disp: 60 tablet, Rfl: 1 ?  diltiazem (CARDIZEM CD) 180 MG 24 hr capsule, Take 1 capsule (180 mg total) by mouth daily., Disp: 90 capsule, Rfl: 3 ?  Dulaglutide (TRULICITY) 4.40 HK/7.4QV SOPN, Inject 0.75 mg into the skin once a week., Disp: 2 mL, Rfl: 6 ?  gabapentin (NEURONTIN) 100 MG capsule, Take 1 capsule (100 mg total) by mouth 3 (three) times daily., Disp: 21 capsule, Rfl: 0 ?  glipiZIDE (GLUCOTROL) 5 MG tablet, Take 1 tablet (5 mg total) by mouth 2 (two) times daily., Disp: 60 tablet, Rfl: 6 ?  Insulin Glargine (BASAGLAR KWIKPEN) 100 UNIT/ML, Inject 5 Units into the skin at bedtime., Disp: 15 mL, Rfl: 2 ?  Insulin Pen Needle (TECHLITE PEN NEEDLES) 32G X 4 MM MISC, use to inject lantus daily, Disp: 100 each, Rfl: 0 ?  lisinopril (ZESTRIL) 20 MG tablet, Take 1 tablet (20 mg total) by mouth daily., Disp: 30 tablet, Rfl: 11 ?  meloxicam (MOBIC) 7.5 MG tablet, Take 1 tablet (7.5 mg total) by mouth daily.,  Disp: 30 tablet, Rfl: 1 ?  ondansetron (ZOFRAN-ODT) 4 MG disintegrating tablet, Take 1 tablet (4 mg total) by mouth every 8 (eight) hours as needed for nausea or vomiting., Disp: 12 tablet, Rfl: 0 ?  polyethylene glycol powder (GLYCOLAX/MIRALAX) 17 GM/SCOOP powder, Take 17 g by mouth daily., Disp: 3350 g, Rfl: 1 ?  rizatriptan (MAXALT) 5 MG tablet, TAKE 1 TABLET (5 MG TOTAL) BY MOUTH AS NEEDED FOR MIGRAINE. MAY REPEAT IN 2 HOURS IF NEEDED (Patient taking differently: Take 5 mg by mouth as needed for migraine (May repeat in 2 hours if needed.).), Disp: 10 tablet, Rfl: 3 ?  rosuvastatin (CRESTOR) 5 MG tablet, Take 1 tablet (5 mg total) by mouth daily., Disp: 30 tablet, Rfl: 2 ?  traMADol (ULTRAM) 50 MG tablet, Take 1 tablet (50 mg total) by mouth every 8 (eight) hours as needed (for pain and muscle  spasms)., Disp: 30 tablet, Rfl: 0 ?  traZODone (DESYREL) 100 MG tablet, TAKE 1 TABLET (100 MG TOTAL) BY MOUTH AT BEDTIME AS NEEDED FOR SLEEP. (Patient taking differently: Take 100 mg by mouth at bedtime as needed for sleep.), Disp: 30 tablet, Rfl: 3 ? ?Social History  ? ?Tobacco Use  ?Smoking Status Never  ?Smokeless Tobacco Never  ? ? ?Allergies  ?Allergen Reactions  ? Gadolinium Derivatives Nausea And Vomiting  ?  Code: VOM, Desc: Pt began vomiting 45 sec after MRI contrast injection of Multihance, Onset Date: [id number] ? ?  ? Iodinated Contrast Media Nausea And Vomiting  ? ?Objective:  ?There were no vitals filed for this visit. ?There is no height or weight on file to calculate BMI. ?Constitutional Well developed. ?Well nourished.  ?Vascular Dorsalis pedis pulses palpable bilaterally. ?Posterior tibial pulses palpable bilaterally. ?Capillary refill normal to all digits.  ?No cyanosis or clubbing noted. ?Pedal hair growth normal.  ?Neurologic Normal speech. ?Oriented to person, place, and time. ?Epicritic sensation to light touch grossly present bilaterally.  ?Dermatologic Pain on palpation of the entire/total nail on 1st digit of the bilaterally ?No other open wounds. ?No skin lesions.  ?Orthopedic: Normal joint ROM without pain or crepitus bilaterally. ?No visible deformities. ?No bony tenderness.  ? ?Radiographs: None ?Assessment:  ? ?1. Ingrown toenail of right foot   ?2. Ingrown left big toenail   ? ?Plan:  ?Patient was evaluated and treated and all questions answered. ? ?Nail contusion/dystrophy hallux with underlying ingrown, bilaterally ?-Patient elects to proceed with minor surgery to remove entire toenail today. Consent reviewed and signed by patient. ?-Entire/total nail excised. See procedure note. ?-Educated on post-procedure care including soaking. Written instructions provided and reviewed. ?-Patient to follow up in 2 weeks for nail check. ? ?Procedure: Excision of entire/total nail with phenol  matricectomy ?Location: Bilateral 1st toe digit ?Anesthesia: Lidocaine 1% plain; 1.5 mL and Marcaine 0.5% plain; 1.5 mL, digital block. ?Skin Prep: Betadine. ?Dressing: Silvadene; telfa; dry, sterile, compression dressing. ?Technique: Following skin prep, the toe was exsanguinated and a tourniquet was secured at the base of the toe. The affected nail border was freed and excised. The tourniquet was then removed and sterile dressing applied. ?Disposition: Patient tolerated procedure well. Patient to return in 2 weeks for follow-up.  ? ?No follow-ups on file. ? ?

## 2022-04-04 ENCOUNTER — Other Ambulatory Visit: Payer: Self-pay

## 2022-04-06 ENCOUNTER — Other Ambulatory Visit: Payer: Self-pay

## 2022-04-25 ENCOUNTER — Other Ambulatory Visit: Payer: Self-pay | Admitting: Family Medicine

## 2022-04-25 ENCOUNTER — Other Ambulatory Visit: Payer: Self-pay

## 2022-04-25 DIAGNOSIS — E7800 Pure hypercholesterolemia, unspecified: Secondary | ICD-10-CM

## 2022-04-25 MED ORDER — ROSUVASTATIN CALCIUM 5 MG PO TABS
5.0000 mg | ORAL_TABLET | Freq: Every day | ORAL | 2 refills | Status: DC
  Filled 2022-04-25 – 2022-05-06 (×2): qty 30, 30d supply, fill #0
  Filled 2022-06-08: qty 30, 30d supply, fill #1

## 2022-04-25 MED FILL — Insulin Pen Needle 32 G X 4 MM (1/6" or 5/32"): 100 days supply | Qty: 100 | Fill #0 | Status: CN

## 2022-05-02 ENCOUNTER — Other Ambulatory Visit: Payer: Self-pay

## 2022-05-06 ENCOUNTER — Other Ambulatory Visit: Payer: Self-pay

## 2022-05-26 ENCOUNTER — Other Ambulatory Visit: Payer: Self-pay

## 2022-05-26 ENCOUNTER — Other Ambulatory Visit: Payer: Self-pay | Admitting: Physician Assistant

## 2022-05-26 DIAGNOSIS — R519 Headache, unspecified: Secondary | ICD-10-CM

## 2022-05-26 MED ORDER — RIZATRIPTAN BENZOATE 5 MG PO TABS
ORAL_TABLET | ORAL | 0 refills | Status: DC
  Filled 2022-05-26: qty 10, 20d supply, fill #0

## 2022-05-26 NOTE — Telephone Encounter (Signed)
Requested medication (s) are due for refill today - expired Rx  Requested medication (s) are on the active medication list -yes  Future visit scheduled -yes  Last refill: 05/13/21 #10 3RF  Notes to clinic: mobile unit provider, expired Rx  Requested Prescriptions  Pending Prescriptions Disp Refills   rizatriptan (MAXALT) 5 MG tablet 10 tablet 3    Sig: TAKE 1 TABLET (5 MG TOTAL) BY MOUTH AS NEEDED FOR MIGRAINE. MAY REPEAT IN 2 HOURS IF NEEDED     There is no refill protocol information for this order       Requested Prescriptions  Pending Prescriptions Disp Refills   rizatriptan (MAXALT) 5 MG tablet 10 tablet 3    Sig: TAKE 1 TABLET (5 MG TOTAL) BY MOUTH AS NEEDED FOR MIGRAINE. MAY REPEAT IN 2 HOURS IF NEEDED     There is no refill protocol information for this order

## 2022-05-27 ENCOUNTER — Other Ambulatory Visit: Payer: Self-pay

## 2022-06-08 ENCOUNTER — Other Ambulatory Visit: Payer: Self-pay

## 2022-06-20 ENCOUNTER — Encounter: Payer: Self-pay | Admitting: Family Medicine

## 2022-06-20 ENCOUNTER — Ambulatory Visit: Payer: No Typology Code available for payment source | Attending: Family Medicine | Admitting: Family Medicine

## 2022-06-20 ENCOUNTER — Other Ambulatory Visit: Payer: Self-pay

## 2022-06-20 VITALS — BP 130/70 | HR 63 | Temp 98.3°F | Ht 60.0 in | Wt 150.4 lb

## 2022-06-20 DIAGNOSIS — G43809 Other migraine, not intractable, without status migrainosus: Secondary | ICD-10-CM

## 2022-06-20 DIAGNOSIS — K219 Gastro-esophageal reflux disease without esophagitis: Secondary | ICD-10-CM

## 2022-06-20 DIAGNOSIS — Z794 Long term (current) use of insulin: Secondary | ICD-10-CM

## 2022-06-20 DIAGNOSIS — I10 Essential (primary) hypertension: Secondary | ICD-10-CM

## 2022-06-20 DIAGNOSIS — E1140 Type 2 diabetes mellitus with diabetic neuropathy, unspecified: Secondary | ICD-10-CM

## 2022-06-20 DIAGNOSIS — E7800 Pure hypercholesterolemia, unspecified: Secondary | ICD-10-CM

## 2022-06-20 DIAGNOSIS — E1169 Type 2 diabetes mellitus with other specified complication: Secondary | ICD-10-CM

## 2022-06-20 LAB — GLUCOSE, POCT (MANUAL RESULT ENTRY): POC Glucose: 200 mg/dl — AB (ref 70–99)

## 2022-06-20 LAB — POCT GLYCOSYLATED HEMOGLOBIN (HGB A1C): HbA1c, POC (controlled diabetic range): 6.3 % (ref 0.0–7.0)

## 2022-06-20 MED ORDER — LISINOPRIL 20 MG PO TABS
20.0000 mg | ORAL_TABLET | Freq: Every day | ORAL | 6 refills | Status: DC
  Filled 2022-06-20 – 2022-07-29 (×2): qty 30, 30d supply, fill #0
  Filled 2022-08-26: qty 30, 30d supply, fill #1
  Filled 2022-09-21: qty 30, 30d supply, fill #2

## 2022-06-20 MED ORDER — TRULICITY 1.5 MG/0.5ML ~~LOC~~ SOAJ
1.5000 mg | SUBCUTANEOUS | 6 refills | Status: DC
  Filled 2022-06-20 – 2022-06-28 (×2): qty 2, 28d supply, fill #0
  Filled 2022-07-29: qty 2, 28d supply, fill #1
  Filled 2022-08-26: qty 2, 28d supply, fill #2
  Filled 2022-09-21: qty 2, 28d supply, fill #3
  Filled 2022-10-28: qty 2, 28d supply, fill #4
  Filled 2022-11-22: qty 2, 28d supply, fill #5
  Filled 2022-12-28: qty 2, 28d supply, fill #6

## 2022-06-20 MED ORDER — GLIPIZIDE 5 MG PO TABS
2.5000 mg | ORAL_TABLET | Freq: Two times a day (BID) | ORAL | 6 refills | Status: DC
  Filled 2022-06-20: qty 30, 30d supply, fill #0
  Filled 2022-07-15: qty 30, 30d supply, fill #1
  Filled 2022-08-17: qty 30, 30d supply, fill #2
  Filled 2022-09-21: qty 30, 30d supply, fill #3
  Filled 2022-10-07: qty 30, 30d supply, fill #4
  Filled 2022-11-22: qty 30, 30d supply, fill #5
  Filled 2022-12-28: qty 30, 30d supply, fill #6

## 2022-06-20 MED ORDER — RIZATRIPTAN BENZOATE 5 MG PO TABS
ORAL_TABLET | ORAL | 2 refills | Status: DC
  Filled 2022-06-20: qty 10, 10d supply, fill #0
  Filled 2022-07-15 – 2022-07-29 (×2): qty 10, 30d supply, fill #1

## 2022-06-20 MED ORDER — PANTOPRAZOLE SODIUM 40 MG PO TBEC
40.0000 mg | DELAYED_RELEASE_TABLET | Freq: Every day | ORAL | 3 refills | Status: DC
  Filled 2022-06-20: qty 30, 30d supply, fill #0
  Filled 2022-07-15: qty 30, 30d supply, fill #1

## 2022-06-20 MED ORDER — ROSUVASTATIN CALCIUM 5 MG PO TABS
5.0000 mg | ORAL_TABLET | Freq: Every day | ORAL | 6 refills | Status: DC
  Filled 2022-06-20 – 2022-07-15 (×2): qty 30, 30d supply, fill #0
  Filled 2022-08-17: qty 30, 30d supply, fill #1
  Filled 2022-09-21: qty 17, 17d supply, fill #2
  Filled 2022-09-21: qty 13, 13d supply, fill #2
  Filled 2022-12-28: qty 30, 30d supply, fill #3

## 2022-06-20 NOTE — Progress Notes (Signed)
Subjective:  Patient ID: Brandi Bryant, female    DOB: 05-14-1956  Age: 66 y.o. MRN: [id number]  CC: Diabetes   HPI Brandi Bryant is a 66 y.o. year old female with a history of GERD, hypertension, hyperlipidemia, breast cancer status post left lumpectomy and radiation, type 2 diabetes mellitus (A1c 6.3) previous CVA, TIA in 03/2018.  Interval History:  She sometimes has a low blood sugar of 62 in the mornings and she is on 5 units of Lantus in addition to Glipizide and Trulicity. Neuropathy is stable on gabapentin.  She is not up-to-date on annual eye exams and has no visual concerns. Endorses adherence with her antihypertensive and her statin.  She Complains of epigastric pain worse around 3-4 am with associated nausea. Relieved by taking an antiemetic. Past Medical History:  Diagnosis Date   Abdominal pain    Allergy    Arthralgia 08/13/2013   Bell palsy 2006   Bell's palsy    Breast cancer ([place]) 2009   Left Breast Cancer   Cataract    Chest pain 09/30/2013   Cholelithiasis    Chronic sinusitis 05/16/2017   Colon cancer screening 12/28/2534   Complication of anesthesia    " tubal ligation, in [place]"  had weakness, couldnt stand up the next day, was given pills for oxygen for Brain."  1 month after I was having loss of attentiviness, still have to focus  carefully.    Coronary artery disease    CVA (cerebral vascular accident) ([place]) 03/30/2018   Depression    Diabetes mellitus    Type II   Dyspnea    at times- when air conditioner is runnimg, heat also    Encounter for screening colonoscopy 10/30/2015   GERD (gastroesophageal reflux disease)    Hepatic steatosis    History of breast cancer 06/01/2012   HTN (hypertension)    Hx of adenomatous polyp of colon 11/07/2019   Hyperlipidemia    Internal carotid artery stenosis 04/03/2018   Personal history of chemotherapy 2009   Left Breast Cancer   Personal history of radiation therapy 2009   Left Breast  Cancer   Stroke [facility])    2006, 2015   TIA (transient ischemic attack) 02/20/2014    Past Surgical History:  Procedure Laterality Date   ANKLE ARTHROSCOPY Right 12/14/2016   Procedure: Right Ankle Arthroscopic Debridement;  Surgeon: Newt Minion, MD;  Location: [facility];  Service: Orthopedics;  Laterality: Right;   Arm surgery     Left tendon lengthen   BREAST LUMPECTOMY Left 2009   CHOLECYSTECTOMY N/A 12/09/2015   Procedure: LAPAROSCOPIC CHOLECYSTECTOMY WITH INTRAOPERATIVE CHOLANGIOGRAM;  Surgeon: Greer Pickerel, MD;  Location: [place];  Service: General;  Laterality: N/A;   LEFT HEART CATHETERIZATION WITH CORONARY ANGIOGRAM N/A 10/30/2013   Procedure: LEFT HEART CATHETERIZATION WITH CORONARY ANGIOGRAM;  Surgeon: Josue Hector, MD;  Location: [facility] C[facility] LAB;  Service: Cardiovascular;  Laterality: N/A;   porta cath Right    Removal of Porta cath Right    TUBAL LIGATION      Family History  Problem Relation Age of Onset   Thyroid cancer Sister    Hypertension Mother    Diabetes Mother    Migraines Daughter    Colon cancer Neg Hx    Esophageal cancer Neg Hx    Stomach cancer Neg Hx    Rectal cancer Neg Hx     Social History   Socioeconomic History   Marital status: Single    Spouse name:  Not on file   Number of children: 6   Years of education: Not on file   Highest education level: High school graduate  Occupational History   Not on file  Tobacco Use   Smoking status: Never   Smokeless tobacco: Never  Vaping Use   Vaping Use: Never used  Substance and Sexual Activity   Alcohol use: No   Drug use: No   Sexual activity: Not Currently    Birth control/protection: Surgical  Other Topics Concern   Not on file  Social History Narrative   Single with 6 children    lives with her daughter   Right handed   Never smoker no drug use no alcohol   Social Determinants of Health   Financial Resource Strain: Not on file  Food Insecurity: Not on file  Transportation Needs: No  Transportation Needs (08/06/2019)   PRAPARE - Hydrologist (Medical): No    Lack of Transportation (Non-Medical): No  Physical Activity: Insufficiently Active (01/25/2018)   Exercise Vital Sign    Days of Exercise per Week: 2 days    Minutes of Exercise per Session: 20 min  Stress: Stress Concern Present (01/25/2018)   [place]    Feeling of Stress : Very much  Social Connections: Moderately Isolated (01/25/2018)   Social Connection and Isolation Panel [NHANES]    Frequency of Communication with Friends and Family: More than three times a week    Frequency of Social Gatherings with Friends and Family: More than three times a week    Attends Religious Services: Never    Marine scientist or Organizations: No    Attends Archivist Meetings: Never    Marital Status: Divorced    Allergies  Allergen Reactions   Gadolinium Derivatives Nausea And Vomiting    Code: VOM, Desc: Pt began vomiting 45 sec after MRI contrast injection of Multihance, Onset Date: [id number]     Iodinated Contrast Media Nausea And Vomiting    Outpatient Medications Prior to Visit  Medication Sig Dispense Refill   aspirin 325 MG EC tablet Take 1 tablet (325 mg total) by mouth daily. 30 tablet 6   cyclobenzaprine (FLEXERIL) 10 MG tablet Take 1 tablet (10 mg total) by mouth every 8 (eight) hours as needed for muscle spasms. 60 tablet 1   diltiazem (CARDIZEM CD) 180 MG 24 hr capsule Take 1 capsule (180 mg total) by mouth daily. 90 capsule 3   gabapentin (NEURONTIN) 100 MG capsule Take 1 capsule (100 mg total) by mouth 3 (three) times daily. 21 capsule 0   Insulin Pen Needle (TECHLITE PEN NEEDLES) 32G X 4 MM MISC use to inject lantus daily 100 each 0   meloxicam (MOBIC) 7.5 MG tablet Take 1 tablet (7.5 mg total) by mouth daily. 30 tablet 1   ondansetron (ZOFRAN-ODT) 4 MG disintegrating tablet Take 1 tablet (4 mg  total) by mouth every 8 (eight) hours as needed for nausea or vomiting. 12 tablet 0   polyethylene glycol powder (GLYCOLAX/MIRALAX) 17 GM/SCOOP powder Take 17 g by mouth daily. 3350 g 1   traMADol (ULTRAM) 50 MG tablet Take 1 tablet (50 mg total) by mouth every 8 (eight) hours as needed (for pain and muscle spasms). 30 tablet 0   Dulaglutide (TRULICITY) 5.09 TO/6.7TI SOPN Inject 0.75 mg into the skin once a week. 2 mL 6   glipiZIDE (GLUCOTROL) 5 MG tablet Take 1 tablet (5  mg total) by mouth 2 (two) times daily. 60 tablet 6   Insulin Glargine (BASAGLAR KWIKPEN) 100 UNIT/ML Inject 5 Units into the skin at bedtime. 15 mL 2   lisinopril (ZESTRIL) 20 MG tablet Take 1 tablet (20 mg total) by mouth daily. 30 tablet 11   rizatriptan (MAXALT) 5 MG tablet TAKE 1 TABLET (5 MG TOTAL) BY MOUTH AS NEEDED FOR MIGRAINE. MAY REPEAT IN 2 HOURS IF NEEDED 10 tablet 0   rosuvastatin (CRESTOR) 5 MG tablet Take 1 tablet (5 mg total) by mouth daily. 30 tablet 2   traZODone (DESYREL) 100 MG tablet TAKE 1 TABLET (100 MG TOTAL) BY MOUTH AT BEDTIME AS NEEDED FOR SLEEP. (Patient taking differently: Take 100 mg by mouth at bedtime as needed for sleep.) 30 tablet 3   No facility-administered medications prior to visit.     ROS Review of Systems  Constitutional:  Negative for activity change and appetite change.  HENT:  Negative for sinus pressure and sore throat.   Respiratory:  Negative for chest tightness, shortness of breath and wheezing.   Cardiovascular:  Negative for chest pain and palpitations.  Gastrointestinal:  Negative for abdominal distention, abdominal pain and constipation.  Genitourinary: Negative.   Musculoskeletal: Negative.   Psychiatric/Behavioral:  Negative for behavioral problems and dysphoric mood.     Objective:  BP 130/70   Pulse 63   Temp 98.3 F (36.8 C)   Ht 5' (1.524 m)   Wt 150 lb 6.4 oz (68.2 kg)   SpO2 97%   BMI 29.37 kg/m      06/20/2022    3:46 PM 03/10/2022    9:31 AM  03/08/2022   10:00 AM  BP/Weight  Systolic BP 284 132 440  Diastolic BP 70 61 60  Wt. (Lbs) 150.4 150   BMI 29.37 kg/m2 29.29 kg/m2       Physical Exam Constitutional:      Appearance: She is well-developed.  Cardiovascular:     Rate and Rhythm: Normal rate.     Heart sounds: Normal heart sounds. No murmur heard. Pulmonary:     Effort: Pulmonary effort is normal.     Breath sounds: Normal breath sounds. No wheezing or rales.  Chest:     Chest wall: No tenderness.  Abdominal:     General: Bowel sounds are normal. There is no distension.     Palpations: Abdomen is soft. There is no mass.     Tenderness: There is no abdominal tenderness.  Musculoskeletal:        General: Normal range of motion.     Right lower leg: No edema.     Left lower leg: No edema.  Neurological:     Mental Status: She is alert and oriented to person, place, and time.  Psychiatric:        Mood and Affect: Mood normal.        Latest Ref Rng & Units 03/07/2022   10:42 PM 01/16/2022    8:01 AM 11/16/2021    8:50 PM  CMP  Glucose 70 - 99 mg/dL 264  89    BUN 8 - 23 mg/dL 12  9    Creatinine 0.44 - 1.00 mg/dL 0.80  0.64  0.80   Sodium 135 - 145 mmol/L 137  141    Potassium 3.5 - 5.1 mmol/L 4.0  3.9    Chloride 98 - 111 mmol/L 108  114    CO2 22 - 32 mmol/L 22  18  Calcium 8.9 - 10.3 mg/dL 8.9  8.9    Total Protein 6.5 - 8.1 g/dL  6.9    Total Bilirubin 0.3 - 1.2 mg/dL  0.7    Alkaline Phos 38 - 126 U/L  63    AST 15 - 41 U/L  35    ALT 0 - 44 U/L  26      Lipid Panel     Component Value Date/Time   CHOL 126 02/03/2022 1059   TRIG 92 02/03/2022 1059   HDL 57 02/03/2022 1059   CHOLHDL 2.2 02/03/2022 1059   CHOLHDL 2.6 03/31/2018 0348   VLDL 33 03/31/2018 0348   LDLCALC 52 02/03/2022 1059    CBC    Component Value Date/Time   WBC 6.9 03/07/2022 2242   RBC 4.44 03/07/2022 2242   HGB 13.3 03/07/2022 2242   HGB 13.4 09/30/2013 1033   HCT 39.7 03/07/2022 2242   HCT 39.1 09/30/2013  1033   PLT 153 03/07/2022 2242   PLT 150 09/30/2013 1033   MCV 89.4 03/07/2022 2242   MCV 88.5 09/30/2013 1033   MCH 30.0 03/07/2022 2242   MCHC 33.5 03/07/2022 2242   RDW 12.3 03/07/2022 2242   RDW 12.8 09/30/2013 1033   LYMPHSABS 2.2 03/07/2022 2242   LYMPHSABS 2.1 09/30/2013 1033   MONOABS 0.7 03/07/2022 2242   MONOABS 0.4 09/30/2013 1033   EOSABS 0.5 03/07/2022 2242   EOSABS 0.2 09/30/2013 1033   BASOSABS 0.0 03/07/2022 2242   BASOSABS 0.0 09/30/2013 1033    Lab Results  Component Value Date   HGBA1C 6.3 06/20/2022    Assessment & Plan:  1. Type 2 diabetes mellitus with other specified complication, with long-term current use of insulin (HCC) Controlled with A1c of 6.3 She has experienced some hypoglycemia hence I will discontinue Lantus and decrease glipizide dose Increase Trulicity dose Counseled on Diabetic diet, my plate method, 845 minutes of moderate intensity exercise/week Blood sugar logs with fasting goals of 80-120 mg/dl, random of less than 180 and in the event of sugars less than 60 mg/dl or greater than 400 mg/dl encouraged to notify the clinic. Advised on the need for annual eye exams, annual foot exams, Pneumonia vaccine. - POCT glycosylated hemoglobin (Hb A1C) - POCT glucose (manual entry) - Dulaglutide (TRULICITY) 1.5 XM/4.6OE SOPN; Inject 1.5 mg into the skin once a week.  Dispense: 2 mL; Refill: 6  2. Type 2 diabetes mellitus with diabetic neuropathy, with long-term current use of insulin (HCC) Neuropathy is controlled - glipiZIDE (GLUCOTROL) 5 MG tablet; Take 0.5 tablets (2.5 mg total) by mouth 2 (two) times daily before a meal.  Dispense: 30 tablet; Refill: 6  3.  Migraine Controlled - rizatriptan (MAXALT) 5 MG tablet; TAKE 1 TABLET (5 MG TOTAL) BY MOUTH AS NEEDED FOR MIGRAINE. MAY REPEAT IN 2 HOURS IF NEEDED  Dispense: 10 tablet; Refill: 2  4. Primary hypertension Controlled Counseled on blood pressure goal of less than 130/80, low-sodium, DASH  diet, medication compliance, 150 minutes of moderate intensity exercise per week. Discussed medication compliance, adverse effects. - lisinopril (ZESTRIL) 20 MG tablet; Take 1 tablet (20 mg total) by mouth daily.  Dispense: 30 tablet; Refill: 6  5. Pure hypercholesterolemia Controlled Low-cholesterol diet - rosuvastatin (CRESTOR) 5 MG tablet; Take 1 tablet (5 mg total) by mouth daily.  Dispense: 30 tablet; Refill: 6  6. Gastroesophageal reflux disease without esophagitis Uncontrolled PPI initiated - pantoprazole (PROTONIX) 40 MG tablet; Take 1 tablet (40 mg total) by mouth daily.  Dispense: 30 tablet; Refill: 3    Meds ordered this encounter  Medications   glipiZIDE (GLUCOTROL) 5 MG tablet    Sig: Take 0.5 tablets (2.5 mg total) by mouth 2 (two) times daily before a meal.    Dispense:  30 tablet    Refill:  6    Dose decrease, discontinue Lantus   Dulaglutide (TRULICITY) 1.5 ZE/0.9QZ SOPN    Sig: Inject 1.5 mg into the skin once a week.    Dispense:  2 mL    Refill:  6    Dose increase   pantoprazole (PROTONIX) 40 MG tablet    Sig: Take 1 tablet (40 mg total) by mouth daily.    Dispense:  30 tablet    Refill:  3   rizatriptan (MAXALT) 5 MG tablet    Sig: TAKE 1 TABLET (5 MG TOTAL) BY MOUTH AS NEEDED FOR MIGRAINE. MAY REPEAT IN 2 HOURS IF NEEDED    Dispense:  10 tablet    Refill:  2   lisinopril (ZESTRIL) 20 MG tablet    Sig: Take 1 tablet (20 mg total) by mouth daily.    Dispense:  30 tablet    Refill:  6   rosuvastatin (CRESTOR) 5 MG tablet    Sig: Take 1 tablet (5 mg total) by mouth daily.    Dispense:  30 tablet    Refill:  6    Follow-up: Return in about 3 months (around 09/20/2022) for Chronic medical conditions.       Charlott Rakes, MD, FAAFP. [facility] and [place], [place]   06/20/2022, 5:12 PM

## 2022-06-20 NOTE — Patient Instructions (Signed)
Hipoglucemia Hypoglycemia La hipoglucemia ocurre cuando el nivel de azcar (glucosa) en la sangre es demasiado bajo. La hipoglucemia puede suceder en personas con o sin diabetes. Puede desarrollarse rpidamente y ser una emergencia mdica. En [place], un nivel de glucemia inferior a 70 mg/dl (3.9 mmol/l) se considera hipoglucemia. La glucosa es un tipo de azcar que constituye la principal fuente de energa del cuerpo. Determinadas hormonas, como la insulina y Secretary/administrator, Probation officer el nivel de glucosa en la [place]. La insulina baja la glucemia, mientras que el glucagn la [place]. Si tiene demasiada insulina en el torrente sanguneo o si no ingiere la cantidad suficiente de alimentos que contengan glucosa, puede tener hipoglucemia. Adems, puede tener hipoglucemia reactiva, que ocurre en el lapso de 4 horas despus de haber comido. Cules son las causas? La hipoglucemia ocurre con ms frecuencia en las personas que tienen diabetes y puede deberse a lo siguiente: Los medicamentos para la diabetes. No comer lo suficiente o no comer con la frecuencia suficiente. Un aumento de la actividad fsica. Beber alcohol con el estmago vaco. Si no tiene diabetes, las causas de la hipoglucemia pueden deberse a lo siguiente: Un tumor en el pncreas. No comer lo suficiente o no comer durante perodos largos (ayunar). Una infeccin o enfermedad grave. Problemas despus de someterse a una ciruga baritrica. Insuficiencia orgnica, como insuficiencia renal o heptica. Ciertos medicamentos. Qu incrementa el riesgo? Es ms probable que la hipoglucemia se Building services engineer las personas que: Tienen diabetes y toman medicamentos para bajar la glucemia. Consumen alcohol en exceso. Tienen una enfermedad grave. Cules son los signos o sntomas? Los sntomas varan; esto depende de que la afeccin sea leve, moderada o grave. Hipoglucemia leve [place]. Sudoracin y piel hmeda. Mareos o sensacin  de desvanecimiento. Somnolencia o sueo agitado. Nuseas. Aumento de la frecuencia cardaca. Dolor de [place]. Visin borrosa. Cambios en el estado de nimo, como irritabilidad o ansiedad. Hormigueo o adormecimiento alrededor de la boca, labios o lengua. Hipoglucemia moderada Confusin y deterioro del sentido de la realidad. Cambios en el comportamiento. Debilidad. Latidos cardacos irregulares. Cambios en la coordinacin. Hipoglucemia grave La hipoglucemia grave es Engineering geologist. Puede ocasionar: Desmayos. Convulsiones. Prdida de la conciencia (estado de coma). Muerte. Cmo se diagnostica? La hipoglucemia se diagnostica con un anlisis de sangre para medir la glucemia. Este anlisis de sangre se realiza cuando tiene sntomas. El mdico tambin puede hacerle un examen fsico y revisar sus antecedentes mdicos. Cmo se trata? El tratamiento de esta afeccin consiste en ingerir de inmediato un alimento o una bebida que contenga azcar con 15 gramos de hidratos de carbono de accin rpida, por ejemplo: 4 onzas (120 ml) de jugo de frutas. 4 onzas (120 ml) de un refresco comn (no diettico). Varios caramelos duros. Lea las etiquetas de informacin nutricional a fin de saber cuntas unidades consumir para obtener 15 gramos. 1 cucharada (15 ml) de azcar o miel. 4 tabletas de glucosa. 1 pomo de glucosa en gel. Tratamiento de la hipoglucemia en las personas con diabetes Si est alerta y puede tragar de Geographical information systems officer segura, siga la regla 15/15, que consiste en lo siguiente: Consuma 15 gramos de un hidrato de carbono de accin rpida. Hable con su mdico acerca de cunto debera consumir. Las opciones para obtener 15 gramos de hidratos de carbono de accin rpida incluyen las siguientes: Comprimidos de glucosa (tome 4 comprimidos). Varios caramelos duros. Lea las etiquetas de informacin nutricional a fin de saber cuntas unidades consumir para obtener 15 gramos. 4  onzas (120 ml) de  jugo de frutas. 4 onzas (120 ml) de un refresco comn (no diettico). 1 cucharada (15 ml) de azcar o miel. 1 pomo de glucosa en gel. Contrlese la glucemia 15 minutos despus de ingerir el hidrato de carbono. Si este nuevo nivel de glucemia todava es igual o menor que 70 mg/dl (3.9 mmol/l), ingiera nuevamente 15 gramos de un hidrato de carbono. Si el nivel de glucemia no supera los 70 mg/dl (3.9 mmol/l) despus de 3 intentos, solicite ayuda de inmediato. Ingiera una comida o una colacin en el transcurso de 1 hora despus de que su nivel de glucemia se haya normalizado.  Tratamiento de la hipoglucemia grave La hipoglucemia se considera grave cuando su nivel de glucemia es menor que 54 mg/dl (3 mmol/l). La hipoglucemia grave es una emergencia mdica. Solicite atencin mdica de inmediato. Si tiene hipoglucemia grave y no puede ingerir ningn alimento ni bebida, ser necesario que le administren glucagn. Un familiar o un amigo debe aprender a controlarle el nivel de glucemia y a darle glucagn. Pregntele al mdico si debera tener disponible un kit de glucagn de emergencia. Es posible que la hipoglucemia grave deba tratarse en un hospital. El tratamiento puede incluir la administracin de glucosa a travs de una va intravenosa. Tambin puede necesitar un tratamiento para tratar la afeccin que est causando la hipoglucemia. Siga estas instrucciones en su casa:  Indicaciones generales Use los medicamentos de venta libre y los recetados solamente como se lo haya indicado el mdico. Contrlese la glucemia como se lo haya indicado el mdico. Si bebe alcohol: Limite la cantidad que bebe a lo siguiente: De 0 a 1 medida por da para las mujeres que no estn embarazadas. De 0 a 2 medidas por da para los hombres. Sepa cunta cantidad de alcohol hay en las bebidas que toma. En los [place], una medida equivale a una botella de cerveza de 12 oz (355 ml), un vaso de vino de 5 oz (148 ml) o un  vaso de una bebida alcohlica de alta graduacin de 1 oz (44 ml). Asegrese de comer alimentos cuando bebe alcohol. Tenga en cuenta que el alcohol se absorbe rpidamente y puede tener efectos persistentes que ms tarde pueden provocar hipoglucemia. Asegrese de realizar un control continuo de la glucosa. Concurra a todas las visitas de seguimiento. Esto es importante. Si usted tiene diabetes: Lleve siempre con usted una opcin de hidratos de carbono de accin rpida (15 gramos) para tratar la glucemia baja. Siga el plan de control de la diabetes como se lo haya indicado el mdico. Asegrese de hacer lo siguiente: Conozca los sntomas de la hipoglucemia. Es importante tratarla de inmediato para evitar que empeore. Controle su nivel de glucemia con la frecuencia que le hayan indicado. Contrlelo siempre antes y despus de hacer ejercicio. Contrlese siempre la glucemia antes de conducir un vehculo motorizado. Tome los medicamentos segn las indicaciones. Siga su plan de comidas. Coma a horario y no se saltee ninguna comida. Comparta su plan de control de la diabetes con sus compaeros de trabajo y de la escuela, y con las otras personas que viven en su hogar. Lleve una tarjeta de alerta mdica o use un brazalete o medalla de alerta mdica. Dnde buscar ms informacin American Diabetes Association (Asociacin Estadounidense de la Diabetes): www.diabetes.org Comunquese con un mdico si: Tiene problemas para mantener la glucemia dentro del rango ideal. Tiene episodios frecuentes de hipoglucemia. Solicite ayuda de inmediato si: Contina teniendo sntomas de hipoglucemia despus de comer   o beber algo que contenga 15 gramos de hidratos de carbono de accin rpida y no puede hacer que su glucemia supere los 70 mg/dl (3.9 mmol/l) siguiendo la regla 15/15. Su nivel de glucemia est por debajo de 54 mg/dl (3 mmol/l). Tiene una convulsin. Se desmaya. Estos sntomas pueden representar un problema  grave que constituye una emergencia. No espere a ver si los sntomas desaparecen. Solicite atencin mdica de inmediato. Comunquese con el servicio de emergencias de su localidad (911 en los [place]). No conduzca por sus propios medios hasta el hospital. Resumen La hipoglucemia ocurre cuando el nivel de azcar (glucosa) en la sangre es demasiado bajo. La hipoglucemia puede suceder en personas con o sin diabetes. Puede desarrollarse rpidamente y ser una emergencia mdica. Asegrese de conocer los sntomas de la hipoglucemia y cmo tratarla. Lleve siempre con usted una opcin de hidratos de carbono de accin rpida para tratar el nivel bajo de azcar en la sangre. Esta informacin no tiene como fin reemplazar el consejo del mdico. Asegrese de hacerle al mdico cualquier pregunta que tenga. Document Revised: 11/02/2020 Document Reviewed: 11/02/2020 Elsevier Patient Education  2023 Elsevier Inc.  

## 2022-06-20 NOTE — Progress Notes (Signed)
Itching on face and legs.

## 2022-06-21 ENCOUNTER — Other Ambulatory Visit: Payer: Self-pay

## 2022-06-27 ENCOUNTER — Other Ambulatory Visit: Payer: Self-pay

## 2022-06-28 ENCOUNTER — Other Ambulatory Visit: Payer: Self-pay

## 2022-07-15 ENCOUNTER — Other Ambulatory Visit: Payer: Self-pay

## 2022-07-18 ENCOUNTER — Other Ambulatory Visit: Payer: Self-pay

## 2022-07-22 ENCOUNTER — Other Ambulatory Visit: Payer: Self-pay

## 2022-07-29 ENCOUNTER — Other Ambulatory Visit: Payer: Self-pay

## 2022-08-01 ENCOUNTER — Other Ambulatory Visit: Payer: Self-pay

## 2022-08-08 ENCOUNTER — Other Ambulatory Visit: Payer: Self-pay

## 2022-08-16 ENCOUNTER — Ambulatory Visit: Payer: Self-pay | Attending: Cardiovascular Disease | Admitting: Cardiovascular Disease

## 2022-08-17 ENCOUNTER — Other Ambulatory Visit: Payer: Self-pay

## 2022-08-26 ENCOUNTER — Other Ambulatory Visit: Payer: Self-pay

## 2022-09-16 ENCOUNTER — Ambulatory Visit (HOSPITAL_COMMUNITY)
Admission: EM | Admit: 2022-09-16 | Discharge: 2022-09-16 | Disposition: A | Discharge status: Home / Self Care | Payer: No Typology Code available for payment source | Attending: Nurse Practitioner | Admitting: Nurse Practitioner

## 2022-09-16 ENCOUNTER — Encounter (HOSPITAL_COMMUNITY): Payer: Self-pay

## 2022-09-16 ENCOUNTER — Ambulatory Visit (INDEPENDENT_AMBULATORY_CARE_PROVIDER_SITE_OTHER): Payer: No Typology Code available for payment source

## 2022-09-16 DIAGNOSIS — M7732 Calcaneal spur, left foot: Secondary | ICD-10-CM

## 2022-09-16 DIAGNOSIS — M79672 Pain in left foot: Secondary | ICD-10-CM

## 2022-09-16 DIAGNOSIS — M722 Plantar fascial fibromatosis: Secondary | ICD-10-CM

## 2022-09-16 MED ORDER — MELOXICAM 15 MG PO TABS: 15.0000 mg | ORAL_TABLET | Freq: Every day | ORAL | 2 refills | Status: DC

## 2022-09-16 NOTE — ED Triage Notes (Signed)
Pt c/o pain to lt foot/heel radiating up to calf x49month. Denies injury. States taking ibuprofen with no relief.

## 2022-09-16 NOTE — Discharge Instructions (Addendum)
You have left foot and heel pain. Your xray is Small plantar calcaneal heel spur and mild chronic enthesopathic change at the Achilles insertion on the calcaneus, unchanged. The patient's symptoms may be from chronic plantar fasciitis.  She has declined to go to the ER for the ultrasound today.  She will contact her PCP on Monday for an order of her ultrasound left leg. Meloxicam 15 mg daily Dsicussed Gabpaentin which she no longer uses due to side effects.  She needs a referral to podiatry for ongoing evaluation/treatment.

## 2022-09-16 NOTE — ED Provider Notes (Signed)
[facility]    CSN: [id number] Arrival date & time: 09/16/22  1319     History   Chief Complaint Chief Complaint  Patient presents with  . Foot Pain    HPI Brandi Bryant is a 66 y.o. female.   HPI  She is having heels pain for the last month. She rates the pain 10/10 left foot pain that  She denies any injury. She is a She has diabetes. She is afraid of the pain radiating to the calf. She reports that her CBG was 230 today. She denies any N/T. Treatment has been otc  The pain gets worse with walking. The goes into the back of her leg to her knee. She had an xray in April but reports that she has only had this pain for the past month.  Past Medical History:  Diagnosis Date  . Abdominal pain   . Allergy   . Arthralgia 08/13/2013  . Bell palsy 2006  . Bell's palsy   . Breast cancer ([place]) 2009   Left Breast Cancer  . Cataract   . Chest pain 09/30/2013  . Cholelithiasis   . Chronic sinusitis 05/16/2017  . Colon cancer screening 10/27/2015  . Complication of anesthesia    " tubal ligation, in [place]"  had weakness, couldnt stand up the next day, was given pills for oxygen for Brain."  1 month after I was having loss of attentiviness, still have to focus  carefully.   . Coronary artery disease   . CVA (cerebral vascular accident) ([place]) 03/30/2018  . Depression   . Diabetes mellitus    Type II  . Dyspnea    at times- when air conditioner is runnimg, heat also   . Encounter for screening colonoscopy 10/30/2015  . GERD (gastroesophageal reflux disease)   . Hepatic steatosis   . History of breast cancer 06/01/2012  . HTN (hypertension)   . Hx of adenomatous polyp of colon 11/07/2019  . Hyperlipidemia   . Internal carotid artery stenosis 04/03/2018  . Personal history of chemotherapy 2009   Left Breast Cancer  . Personal history of radiation therapy 2009   Left Breast Cancer  . Stroke ([place])    2006, 2015  . TIA (transient ischemic attack) 02/20/2014     Patient Active Problem List   Diagnosis Date Noted  . AKI (acute kidney injury) ([place]) 07/16/2021  . Pyelonephritis 07/15/2021  . Epigastric pain 06/08/2021  . Vitamin D deficiency 05/13/2021  . Insomnia 05/13/2021  . Other cirrhosis of liver ([place]) 11/14/2019  . Kidney lesion, native, left 11/14/2019  . Hx of adenomatous polyp of colon 11/07/2019  . Carpal tunnel syndrome 11/07/2018  . GERD (gastroesophageal reflux disease) 11/27/2017  . Diabetic polyneuropathy associated with type 2 diabetes mellitus ([place]) 10/31/2016  . Impingement syndrome of right ankle 10/31/2016  . Symptomatic cholelithiasis 12/09/2015  . Daily headache 02/21/2014  . Trigeminal neuralgia 01/22/2014  . Weakness 01/22/2014  . Bell's palsy 01/21/2014  . NAFLD (nonalcoholic fatty liver disease) 01/21/2014  . Facial droop 01/15/2014  . Stroke ([place]) 01/15/2014  . Type 2 diabetes mellitus with diabetic neuropathy ([place]) 12/06/2013  . Hyperlipidemia 11/07/2013  . CAD (coronary artery disease), native coronary artery 11/07/2013  . Chest pain 09/30/2013  . History of breast cancer 06/01/2012  . HTN (hypertension)   . Diabetes mellitus ([place])     Past Surgical History:  Procedure Laterality Date  . ANKLE ARTHROSCOPY Right 12/14/2016   Procedure: Right Ankle Arthroscopic Debridement;  Surgeon: Beverely Low  Fernanda Drum, MD;  Location: [facility];  Service: Orthopedics;  Laterality: Right;  . Arm surgery     Left tendon lengthen  . BREAST LUMPECTOMY Left 2009  . CHOLECYSTECTOMY N/A 12/09/2015   Procedure: LAPAROSCOPIC CHOLECYSTECTOMY WITH INTRAOPERATIVE CHOLANGIOGRAM;  Surgeon: Greer Pickerel, MD;  Location: [facility];  Service: General;  Laterality: N/A;  . LEFT HEART CATHETERIZATION WITH CORONARY ANGIOGRAM N/A 10/30/2013   Procedure: LEFT HEART CATHETERIZATION WITH CORONARY ANGIOGRAM;  Surgeon: Josue Hector, MD;  Location[facility] CATH LAB;  Service: Cardiovascular;  Laterality: N/A;  . porta cath Right   . Removal of Porta cath Right    . TUBAL LIGATION      OB History     Gravida  6   Para  6   Term  6   Preterm      AB      Living  6      SAB      IAB      Ectopic      Multiple      Live Births               Home Medications    Prior to Admission medications   Medication Sig Start Date End Date Taking? Authorizing Provider  aspirin 325 MG EC tablet Take 1 tablet (325 mg total) by mouth daily. 06/14/18   Argentina Donovan, PA-C  cyclobenzaprine (FLEXERIL) 10 MG tablet Take 1 tablet (10 mg total) by mouth every 8 (eight) hours as needed for muscle spasms. 03/10/22   Charlott Rakes, MD  diltiazem (CARDIZEM CD) 180 MG 24 hr capsule Take 1 capsule (180 mg total) by mouth daily. 02/03/22 09/25/22  Warren Lacy, PA-C  Dulaglutide (TRULICITY) 1.5 MV/7.8IO SOPN Inject 1.5 mg into the skin once a week. 06/20/22   Charlott Rakes, MD  glipiZIDE (GLUCOTROL) 5 MG tablet Take 0.5 tablets (2.5 mg total) by mouth 2 (two) times daily before a meal. 06/20/22 06/20/23  Charlott Rakes, MD  Insulin Pen Needle (TECHLITE PEN NEEDLES) 32G X 4 MM MISC use to inject lantus daily 04/25/22   Charlott Rakes, MD  lisinopril (ZESTRIL) 20 MG tablet Take 1 tablet (20 mg total) by mouth daily. 06/20/22 06/20/23  Charlott Rakes, MD  meloxicam (MOBIC) 7.5 MG tablet Take 1 tablet (7.5 mg total) by mouth daily. 08/03/21   Charlott Rakes, MD  ondansetron (ZOFRAN-ODT) 4 MG disintegrating tablet Take 1 tablet (4 mg total) by mouth every 8 (eight) hours as needed for nausea or vomiting. 01/16/22   Tacy Learn, PA-C  pantoprazole (PROTONIX) 40 MG tablet Take 1 tablet (40 mg total) by mouth daily. 06/20/22   Charlott Rakes, MD  polyethylene glycol powder (GLYCOLAX/MIRALAX) 17 GM/SCOOP powder Take 17 g by mouth daily. 03/10/22   Charlott Rakes, MD  rizatriptan (MAXALT) 5 MG tablet TAKE 1 TABLET (5 MG TOTAL) BY MOUTH AS NEEDED FOR MIGRAINE. MAY REPEAT IN 2 HOURS IF NEEDED 06/20/22   Charlott Rakes, MD  rosuvastatin (CRESTOR) 5 MG tablet  Take 1 tablet (5 mg total) by mouth daily. 06/20/22   Charlott Rakes, MD  traZODone (DESYREL) 100 MG tablet TAKE 1 TABLET (100 MG TOTAL) BY MOUTH AT BEDTIME AS NEEDED FOR SLEEP. Patient taking differently: Take 100 mg by mouth at bedtime as needed for sleep. 05/13/21 05/13/22  Mayers, Cari S, PA-C  escitalopram (LEXAPRO) 10 MG tablet Take 10 mg by mouth daily.  01/31/12  [provider]    Family History Family History  Problem Relation Age of Onset  . Thyroid cancer Sister   . Hypertension Mother   . Diabetes Mother   . Migraines Daughter   . Colon cancer Neg Hx   . Esophageal cancer Neg Hx   . Stomach cancer Neg Hx   . Rectal cancer Neg Hx     Social History Social History   Tobacco Use  . Smoking status: Never  . Smokeless tobacco: Never  Vaping Use  . Vaping Use: Never used  Substance Use Topics  . Alcohol use: No  . Drug use: No     Allergies   Gadolinium derivatives and Iodinated contrast media   Review of Systems Review of Systems   Physical Exam Triage Vital Signs ED Triage Vitals  Enc Vitals Group     BP 09/16/22 1502 (!) 177/88     Pulse Rate 09/16/22 1502 77     Resp 09/16/22 1502 18     Temp 09/16/22 1502 98.6 F (37 C)     Temp Source 09/16/22 1502 Oral     SpO2 09/16/22 1502 96 %     Weight --      Height --      Head Circumference --      Peak Flow --      Pain Score 09/16/22 1503 8     Pain Loc --      Pain Edu? --      Excl. in [place]? --    No data found.  Updated Vital Signs BP (!) 177/88 (BP Location: Right Arm)   Pulse 77   Temp 98.6 F (37 C) (Oral)   Resp 18   SpO2 96%   Visual Acuity Right Eye Distance:   Left Eye Distance:   Bilateral Distance:    Right Eye Near:   Left Eye Near:    Bilateral Near:     Physical Exam Constitutional:      Appearance: She is obese.  HENT:     Head: Normocephalic.  Cardiovascular:     Rate and Rhythm: Normal rate.     Pulses: Normal pulses.  Pulmonary:     Effort:  Pulmonary effort is normal.  Musculoskeletal:        General: Normal range of motion.  Skin:    General: Skin is warm.     Capillary Refill: Capillary refill takes less than 2 seconds.  Neurological:     General: No focal deficit present.     Mental Status: She is alert and oriented to person, place, and time.  Psychiatric:        Mood and Affect: Mood normal.     UC Treatments / Results  Labs (all labs ordered are listed, but only abnormal results are displayed) Labs Reviewed - No data to display  EKG   Radiology DG Foot Complete Left  Result Date: 09/16/2022 CLINICAL DATA:  Heel pain including burning pain. Pain with weight-bearing. Pain radiates up calf for 4 months. EXAM: LEFT FOOT - COMPLETE 3+ VIEW COMPARISON:  Left foot radiographs 03/07/2022 FINDINGS: There appears to be mild-to-moderate great toe soft tissue swelling, slightly improved from prior. Small plantar calcaneal heel spur and mild chronic enthesopathic change at the Achilles insertion on the calcaneus, unchanged. No acute fracture is seen. No dislocation. No cortical erosion or periostitis. IMPRESSION: Small plantar calcaneal heel spur and mild chronic enthesopathic change at the Achilles insertion on the calcaneus, unchanged. The patient's symptoms may be from chronic plantar fasciitis. Electronically Signed  By: Yvonne Kendall M.D.   On: 09/16/2022 16:12    Procedures Procedures (including critical care time)  Medications Ordered in UC Medications - No data to display  Initial Impression / Assessment and Plan / UC Course  I have reviewed the triage vital signs and the nursing notes.  Pertinent labs & imaging results that were available during my care of the patient were reviewed by me and considered in my medical decision making (see chart for details).    Right foot pain Final Clinical Impressions(s) / UC Diagnoses   Final diagnoses:  None     Discharge Instructions      You have left foot and  heel pain. Your xray is  She has declined to go to the ER for the ultrasound today.  She will contact her PCP on Monday for an order of her ultrasound right leg. Dsicussed Gabpaentin which she no longer uses due to side effects.      ED Prescriptions   None    PDMP not reviewed this encounter.

## 2022-09-21 ENCOUNTER — Other Ambulatory Visit: Payer: Self-pay

## 2022-10-04 ENCOUNTER — Ambulatory Visit: Payer: No Typology Code available for payment source | Admitting: Family Medicine

## 2022-10-07 ENCOUNTER — Ambulatory Visit: Payer: Self-pay | Attending: Family Medicine | Admitting: Family Medicine

## 2022-10-07 ENCOUNTER — Other Ambulatory Visit: Payer: Self-pay

## 2022-10-07 VITALS — BP 150/75 | HR 69 | Temp 98.4°F | Wt 152.0 lb

## 2022-10-07 DIAGNOSIS — R1084 Generalized abdominal pain: Secondary | ICD-10-CM

## 2022-10-07 DIAGNOSIS — Z794 Long term (current) use of insulin: Secondary | ICD-10-CM

## 2022-10-07 DIAGNOSIS — M7732 Calcaneal spur, left foot: Secondary | ICD-10-CM

## 2022-10-07 DIAGNOSIS — E1169 Type 2 diabetes mellitus with other specified complication: Secondary | ICD-10-CM

## 2022-10-07 DIAGNOSIS — Z23 Encounter for immunization: Secondary | ICD-10-CM

## 2022-10-07 DIAGNOSIS — H539 Unspecified visual disturbance: Secondary | ICD-10-CM

## 2022-10-07 DIAGNOSIS — I10 Essential (primary) hypertension: Secondary | ICD-10-CM

## 2022-10-07 LAB — POCT GLYCOSYLATED HEMOGLOBIN (HGB A1C): HbA1c, POC (controlled diabetic range): 6.3 % (ref 0.0–7.0)

## 2022-10-07 MED ORDER — LISINOPRIL-HYDROCHLOROTHIAZIDE 20-25 MG PO TABS
1.0000 | ORAL_TABLET | Freq: Every day | ORAL | 3 refills | Status: DC
  Filled 2022-10-07: qty 90, 90d supply, fill #0
  Filled 2022-12-28: qty 30, 30d supply, fill #1
  Filled 2023-01-20: qty 30, 30d supply, fill #2
  Filled 2023-02-22: qty 30, 30d supply, fill #3
  Filled 2023-03-20: qty 30, 30d supply, fill #4
  Filled 2023-04-24: qty 90, 90d supply, fill #5
  Filled 2023-07-17: qty 30, 30d supply, fill #6
  Filled 2023-09-13: qty 30, 30d supply, fill #7

## 2022-10-07 NOTE — Progress Notes (Signed)
Subjective:  Patient ID: Brandi Bryant, female    DOB: 1956/03/14  Age: 66 y.o. MRN: [id number]  CC: Diabetes   HPI Brandi Bryant is a 66 y.o. year old female with a history of GERD, hypertension, hyperlipidemia, breast cancer status post left lumpectomy and radiation, type 2 diabetes mellitus (A1c 6.3) previous CVA, TIA in 03/2018.   Interval History:  Complains of abdominal pain which is daily.  At her last visit, 3 months ago she had also complained of epigastric pain with associated nausea relieved by taking an antiemetic. Now she states she has no relief even with her PPI.  Symptoms are not related to certain foods.  She has no constipation or diarrhea.  Her BP is elevated but she forgot to take her antihypertensive. At home her BP runs 557 systolic. She was diagnosed with left foot plantar fasciitis and calcaneal spur after an ED visit and prescribed Mobic which has been ineffective.  She has also tried insoles with no relief.  Regards to her diabetes mellitus she denies presence of hypoglycemia, numbness in extremities but she does have visual concerns.  In the past she was informed she had cataracts but also complains that she had oil splashed in her right eye and this has affected her vision ever since.  She was seen at [facility] and they prescribed glasses but she has not noticed any difference. Past Medical History:  Diagnosis Date   Abdominal pain    Allergy    Arthralgia 08/13/2013   Bell palsy 2006   Bell's palsy    Breast cancer ([place]) 2009   Left Breast Cancer   Cataract    Chest pain 09/30/2013   Cholelithiasis    Chronic sinusitis 05/16/2017   Colon cancer screening 32/12/252   Complication of anesthesia    " tubal ligation, in [place]"  had weakness, couldnt stand up the next day, was given pills for oxygen for Brain."  1 month after I was having loss of attentiviness, still have to focus  carefully.    Coronary artery disease    CVA (cerebral  vascular accident) ([place]) 03/30/2018   Depression    Diabetes mellitus    Type II   Dyspnea    at times- when air conditioner is runnimg, heat also    Encounter for screening colonoscopy 10/30/2015   GERD (gastroesophageal reflux disease)    Hepatic steatosis    History of breast cancer 06/01/2012   HTN (hypertension)    Hx of adenomatous polyp of colon 11/07/2019   Hyperlipidemia    Internal carotid artery stenosis 04/03/2018   Personal history of chemotherapy 2009   Left Breast Cancer   Personal history of radiation therapy 2009   Left Breast Cancer   Stroke [facility])    2006, 2015   TIA (transient ischemic attack) 02/20/2014    Past Surgical History:  Procedure Laterality Date   ANKLE ARTHROSCOPY Right 12/14/2016   Procedure: Right Ankle Arthroscopic Debridement;  Surgeon: Newt Minion, MD;  Location: [facility];  Service: Orthopedics;  Laterality: Right;   Arm surgery     Left tendon lengthen   BREAST LUMPECTOMY Left 2009   CHOLECYSTECTOMY N/A 12/09/2015   Procedure: LAPAROSCOPIC CHOLECYSTECTOMY WITH INTRAOPERATIVE CHOLANGIOGRAM;  Surgeon: Greer Pickerel, MD;  Location: [facility];  Service: General;  Laterality: N/A;   LEFT HEART CATHETERIZATION WITH CORONARY ANGIOGRAM N/A 10/30/2013   Procedure: LEFT HEART CATHETERIZATION WITH CORONARY ANGIOGRAM;  Surgeon: Josue Hector, MD;  Location[facility] C[facility] LAB;  Service:  Cardiovascular;  Laterality: N/A;   porta cath Right    Removal of Porta cath Right    TUBAL LIGATION      Family History  Problem Relation Age of Onset   Thyroid cancer Sister    Hypertension Mother    Diabetes Mother    Migraines Daughter    Colon cancer Neg Hx    Esophageal cancer Neg Hx    Stomach cancer Neg Hx    Rectal cancer Neg Hx     Social History   Socioeconomic History   Marital status: Single    Spouse name: Not on file   Number of children: 6   Years of education: Not on file   Highest education level: High school graduate  Occupational History   Not on  file  Tobacco Use   Smoking status: Never   Smokeless tobacco: Never  Vaping Use   Vaping Use: Never used  Substance and Sexual Activity   Alcohol use: No   Drug use: No   Sexual activity: Not Currently    Birth control/protection: Surgical  Other Topics Concern   Not on file  Social History Narrative   Single with 6 children    lives with her daughter   Right handed   Never smoker no drug use no alcohol   Social Determinants of Health   Financial Resource Strain: Not on file  Food Insecurity: Not on file  Transportation Needs: No Transportation Needs (08/06/2019)   PRAPARE - Hydrologist (Medical): No    Lack of Transportation (Non-Medical): No  Physical Activity: Insufficiently Active (01/25/2018)   Exercise Vital Sign    Days of Exercise per Week: 2 days    Minutes of Exercise per Session: 20 min  Stress: Stress Concern Present (01/25/2018)   [place]    Feeling of Stress : Very much  Social Connections: Moderately Isolated (01/25/2018)   Social Connection and Isolation Panel [NHANES]    Frequency of Communication with Friends and Family: More than three times a week    Frequency of Social Gatherings with Friends and Family: More than three times a week    Attends Religious Services: Never    Marine scientist or Organizations: No    Attends Archivist Meetings: Never    Marital Status: Divorced    Allergies  Allergen Reactions   Gadolinium Derivatives Nausea And Vomiting    Code: VOM, Desc: Pt began vomiting 45 sec after MRI contrast injection of Multihance, Onset Date: [id number]     Iodinated Contrast Media Nausea And Vomiting    Outpatient Medications Prior to Visit  Medication Sig Dispense Refill   aspirin 325 MG EC tablet Take 1 tablet (325 mg total) by mouth daily. 30 tablet 6   diltiazem (CARDIZEM CD) 180 MG 24 hr capsule Take 1 capsule (180 mg  total) by mouth daily. 90 capsule 3   Dulaglutide (TRULICITY) 1.5 UX/3.2TF SOPN Inject 1.5 mg into the skin once a week. 2 mL 6   glipiZIDE (GLUCOTROL) 5 MG tablet Take 0.5 tablets (2.5 mg total) by mouth 2 (two) times daily before a meal. 30 tablet 6   Insulin Pen Needle (TECHLITE PEN NEEDLES) 32G X 4 MM MISC use to inject lantus daily 100 each 0   meloxicam (MOBIC) 15 MG tablet Take 1 tablet (15 mg total) by mouth daily. 30 tablet 2   ondansetron (ZOFRAN-ODT) 4 MG disintegrating tablet  Take 1 tablet (4 mg total) by mouth every 8 (eight) hours as needed for nausea or vomiting. 12 tablet 0   pantoprazole (PROTONIX) 40 MG tablet Take 1 tablet (40 mg total) by mouth daily. 30 tablet 3   polyethylene glycol powder (GLYCOLAX/MIRALAX) 17 GM/SCOOP powder Take 17 g by mouth daily. 3350 g 1   rizatriptan (MAXALT) 5 MG tablet TAKE 1 TABLET (5 MG TOTAL) BY MOUTH AS NEEDED FOR MIGRAINE. MAY REPEAT IN 2 HOURS IF NEEDED 10 tablet 2   rosuvastatin (CRESTOR) 5 MG tablet Take 1 tablet (5 mg total) by mouth daily. 30 tablet 6   lisinopril (ZESTRIL) 20 MG tablet Take 1 tablet (20 mg total) by mouth daily. 30 tablet 6   cyclobenzaprine (FLEXERIL) 10 MG tablet Take 1 tablet (10 mg total) by mouth every 8 (eight) hours as needed for muscle spasms. (Patient not taking: Reported on 10/07/2022) 60 tablet 1   traZODone (DESYREL) 100 MG tablet TAKE 1 TABLET (100 MG TOTAL) BY MOUTH AT BEDTIME AS NEEDED FOR SLEEP. (Patient taking differently: Take 100 mg by mouth at bedtime as needed for sleep.) 30 tablet 3   No facility-administered medications prior to visit.     ROS Review of Systems  Constitutional:  Negative for activity change and appetite change.  HENT:  Negative for sinus pressure and sore throat.   Respiratory:  Negative for chest tightness, shortness of breath and wheezing.   Cardiovascular:  Negative for chest pain and palpitations.  Gastrointestinal:  Positive for abdominal pain and nausea. Negative for  abdominal distention and constipation.  Genitourinary: Negative.   Musculoskeletal:        See HPI  Psychiatric/Behavioral:  Negative for behavioral problems and dysphoric mood.     Objective:  BP (!) 150/75   Pulse 69   Temp 98.4 F (36.9 C) (Oral)   Wt 152 lb (68.9 kg)   SpO2 96%   BMI 29.69 kg/m      10/07/2022   10:46 AM 09/16/2022    3:02 PM 06/20/2022    3:46 PM  BP/Weight  Systolic BP 161 096 045  Diastolic BP 75 88 70  Wt. (Lbs) 152  150.4  BMI 29.69 kg/m2  29.37 kg/m2      Physical Exam Constitutional:      Appearance: She is well-developed.  Cardiovascular:     Rate and Rhythm: Normal rate.     Heart sounds: Normal heart sounds. No murmur heard. Pulmonary:     Effort: Pulmonary effort is normal.     Breath sounds: Normal breath sounds. No wheezing or rales.  Chest:     Chest wall: No tenderness.  Abdominal:     General: Bowel sounds are normal. There is no distension.     Palpations: Abdomen is soft. There is no mass.     Tenderness: There is abdominal tenderness (epigastric).  Musculoskeletal:        General: Normal range of motion.     Right lower leg: No edema.     Left lower leg: No edema.  Neurological:     Mental Status: She is alert and oriented to person, place, and time.  Psychiatric:        Mood and Affect: Mood normal.        Latest Ref Rng & Units 03/07/2022   10:42 PM 01/16/2022    8:01 AM 11/16/2021    8:50 PM  CMP  Glucose 70 - 99 mg/dL 264  89    BUN  8 - 23 mg/dL 12  9    Creatinine 0.44 - 1.00 mg/dL 0.80  0.64  0.80   Sodium 135 - 145 mmol/L 137  141    Potassium 3.5 - 5.1 mmol/L 4.0  3.9    Chloride 98 - 111 mmol/L 108  114    CO2 22 - 32 mmol/L 22  18    Calcium 8.9 - 10.3 mg/dL 8.9  8.9    Total Protein 6.5 - 8.1 g/dL  6.9    Total Bilirubin 0.3 - 1.2 mg/dL  0.7    Alkaline Phos 38 - 126 U/L  63    AST 15 - 41 U/L  35    ALT 0 - 44 U/L  26      Lipid Panel     Component Value Date/Time   CHOL 126 02/03/2022  1059   TRIG 92 02/03/2022 1059   HDL 57 02/03/2022 1059   CHOLHDL 2.2 02/03/2022 1059   CHOLHDL 2.6 03/31/2018 0348   VLDL 33 03/31/2018 0348   LDLCALC 52 02/03/2022 1059    CBC    Component Value Date/Time   WBC 6.9 03/07/2022 2242   RBC 4.44 03/07/2022 2242   HGB 13.3 03/07/2022 2242   HGB 13.4 09/30/2013 1033   HCT 39.7 03/07/2022 2242   HCT 39.1 09/30/2013 1033   PLT 153 03/07/2022 2242   PLT 150 09/30/2013 1033   MCV 89.4 03/07/2022 2242   MCV 88.5 09/30/2013 1033   MCH 30.0 03/07/2022 2242   MCHC 33.5 03/07/2022 2242   RDW 12.3 03/07/2022 2242   RDW 12.8 09/30/2013 1033   LYMPHSABS 2.2 03/07/2022 2242   LYMPHSABS 2.1 09/30/2013 1033   MONOABS 0.7 03/07/2022 2242   MONOABS 0.4 09/30/2013 1033   EOSABS 0.5 03/07/2022 2242   EOSABS 0.2 09/30/2013 1033   BASOSABS 0.0 03/07/2022 2242   BASOSABS 0.0 09/30/2013 1033    Lab Results  Component Value Date   HGBA1C 6.3 10/07/2022    Assessment & Plan:  1. Type 2 diabetes mellitus with other specified complication, with long-term current use of insulin (HCC) Controlled with A1c of 6.3 Continue current regimen Counseled on Diabetic diet, my plate method, 426 minutes of moderate intensity exercise/week Blood sugar logs with fasting goals of 80-120 mg/dl, random of less than 180 and in the event of sugars less than 60 mg/dl or greater than 400 mg/dl encouraged to notify the clinic. Advised on the need for annual eye exams, annual foot exams, Pneumonia vaccine. - POCT glycosylated hemoglobin (Hb A1C)  2. Calcaneal spur of foot, left Uncontrolled with associated plantar fasciitis Advised to continue using insoles We will refer to podiatry - Ambulatory referral to Podiatry  3. Generalized abdominal pain We will need to be evaluated for PUD Continue PPI - Ambulatory referral to Gastroenterology  4. Primary hypertension Controlled - lisinopril-hydrochlorothiazide (ZESTORETIC) 20-25 MG tablet; Take 1 tablet by mouth  daily.  Dispense: 90 tablet; Refill: 3 - CMP14+EGFR - Microalbumin/Creatinine Ratio, Urine  5. Vision abnormalities - Ambulatory referral to Ophthalmology  6. Need for immunization against influenza - Flu Vaccine QUAD 58moIM (Fluarix, Fluzone & Alfiuria Quad PF)   Meds ordered this encounter  Medications   lisinopril-hydrochlorothiazide (ZESTORETIC) 20-25 MG tablet    Sig: Take 1 tablet by mouth daily.    Dispense:  90 tablet    Refill:  3    Discontinue Lisinopril    Follow-up: Return in about 3 months (around 01/07/2023) for Chronic medical conditions.  Charlott Rakes, MD, FAAFP. [facility] and [facility], [place]   10/07/2022, 2:43 PM

## 2022-10-07 NOTE — Progress Notes (Signed)
Pain in left foot. Abdominal pain daily

## 2022-10-07 NOTE — Patient Instructions (Signed)
Plan de alimentacin DASH DASH Eating Plan DASH es la sigla en ingls de "Enfoques Alimentarios para Detener la Hipertensin". El plan de alimentacin DASH ha demostrado: Bajar la presin arterial elevada (hipertensin). Reducir el riesgo de diabetes tipo 2, enfermedad cardaca y accidente cerebrovascular. Ayudar a perder peso. Consejos para seguir este plan Leer las etiquetas de los alimentos Verifique la cantidad de sal (sodio) por porcin en las etiquetas de los alimentos. Elija alimentos con menos del 5 por ciento del valor diario de sodio. Generalmente, los alimentos con menos de 300 miligramos (mg) de sodio por porcin se encuadran dentro de este plan alimentario. Para encontrar cereales integrales, busque la palabra "integral" como primera palabra en la lista de ingredientes. Al ir de compras Compre productos en los que en su etiqueta diga: "bajo contenido de sodio" o "sin agregado de sal". Compre alimentos frescos. Evite los alimentos enlatados y comidas precocidas o congeladas. Al cocinar Evite agregar sal cuando cocine. Use hierbas o aderezos sin sal, en lugar de sal de mesa o sal marina. Consulte al mdico o farmacutico antes de usar sustitutos de la sal. No fra los alimentos. A la hora de cocinar los alimentos opte por hornearlos, hervirlos, grillarlos, asarlos al horno y asarlos a la parrilla. Cocine con aceites cardiosaludables, como oliva, canola, aguacate, soja o girasol. Planificacin de las comidas  Consuma una dieta equilibrada, que incluya lo siguiente: 4 o ms porciones de frutas y 4 o ms porciones de verduras por da. Trate de que medio plato de cada comida sea de frutas y verduras. De 6 a 8 porciones de cereales integrales todos los das. Menos de 6 onzas (170 g) de carne, aves o pescado magros por da. Una porcin de 3 onzas (85 g) de carne tiene casi el mismo tamao que un mazo de cartas. Un huevo equivale a 1 onza (28 g). De 2 a 3 porciones de productos lcteos  descremados por da. Una porcin es 1 taza (237 ml). 1 porcin de frutos secos, semillas o frijoles 5 veces por semana. De 2 a 3 porciones de grasas cardiosaludables. Las grasas saludables llamadas cidos grasos omega-3 se encuentran en alimentos como las nueces, las semillas de lino, las leches fortificadas y los huevos. Estas grasas tambin se encuentran en los pescados de agua fra, como la sardina, el salmn y la caballa. Limite la cantidad que consume de: Alimentos enlatados o envasados. Alimentos con alto contenido de grasa trans, como algunos alimentos fritos. Alimentos con alto contenido de grasa saturada, como carne con grasa. Postres y otros dulces, bebidas azucaradas y otros alimentos con azcar agregada. Productos lcteos enteros. No le agregue sal a los alimentos antes de probarlos. No coma ms de 4 yemas de huevo por semana. Trate de comer al menos 2 comidas vegetarianas por semana. Consuma ms comida casera y menos de restaurante, de bares y comida rpida. Estilo de vida Cuando coma en un restaurante, pida que preparen su comida con menos sal o, en lo posible, sin nada de sal. Si bebe alcohol: Limite la cantidad que bebe: De 0 a 1 medida por da para las mujeres que no estn embarazadas. De 0 a 2 medidas por da para los hombres. Est atento a la cantidad de alcohol que hay en las bebidas que toma. En los [place], una medida equivale a una botella de cerveza de 12 oz (355 ml), un vaso de vino de 5 oz (148 ml) o un vaso de una bebida alcohlica de alta graduacin de 1 oz (  44 ml). Informacin general Evite ingerir ms de 2300 mg de sal por da. Si tiene hipertensin, es posible que necesite reducir la ingesta de sodio a 1,500 mg por da. Trabaje con su mdico para mantener un peso saludable o perder peso. Pregntele cul es el peso recomendado para usted. Realice al menos 30 minutos de ejercicio que haga que se acelere su corazn (ejercicio aerbico) la mayora de los das  de la semana. Estas actividades pueden incluir caminar, nadar o andar en bicicleta. Trabaje con su mdico o nutricionista para ajustar su plan alimentario a sus necesidades calricas personales. Qu alimentos debo comer? Frutas Todas las frutas frescas, congeladas o disecadas. Frutas enlatadas en jugo natural (sin agregado de azcar). Verduras Verduras frescas o congeladas (crudas, al vapor, asadas o grilladas). Jugos de tomate y verduras con bajo contenido de sodio o reducidos en sodio. Salsa y pasta de tomate con bajo contenido de sodio o reducidas en sodio. Verduras enlatadas con bajo contenido de sodio o reducidas en sodio. Granos Pan de salvado o integral. Pasta de salvado o integral. Arroz integral. Avena. Quinua. Trigo burgol. Cereales integrales y con bajo contenido de sodio. Pan pita. Galletitas de agua con bajo contenido de grasa y sodio. Tortillas de harina integral. Carnes y otras protenas Pollo o pavo sin piel. Carne de pollo o de pavo molida. Cerdo desgrasado. Pescado y mariscos. Claras de huevo. Porotos, guisantes o lentejas secos. Frutos secos, mantequilla de frutos secos y semillas sin sal. Frijoles enlatados sin sal. Cortes de carne vacuna magra, desgrasada. Carne precocida o curada magra y baja en sodio, como embutidos o panes de carne. Lcteos Leche descremada (1 %) o descremada. Quesos reducidos en grasa, con bajo contenido de grasa o descremados. Queso blanco o ricota sin grasa, con bajo contenido de sodio. Yogur semidescremado o descremado. Queso con bajo contenido de grasa y sodio. Grasas y aceites Margarinas untables que no contengan grasas trans. Aceite vegetal. Mayonesa y aderezos para ensaladas livianos, reducidos en grasa o con bajo contenido de grasas (reducidos en sodio). Aceite de canola, crtamo, oliva, aguacate, soja y girasol. Aguac[place]. Alios y condimentos Hierbas. Especias. Mezclas de condimentos sin sal. Otros alimentos Palomitas de maz y pretzels sin sal.  Dulces con bajo contenido de grasas. Es posible que los productos que se enumeran ms arriba no constituyan una lista completa de los alimentos y las bebidas que puede tomar. Consulte a un nutricionista para obtener ms informacin. Qu alimentos debo evitar? Frutas Fruta enlatada en almbar liviano o espeso. Frutas cocidas en aceite. Frutas con salsa de crema o mantequilla. Verduras Verduras con crema o fritas. Verduras en salsa de queso. Verduras enlatadas regulares (que no sean con bajo contenido de sodio o reducidas en sodio). Pasta y salsa de tomates enlatadas regulares (que no sean con bajo contenido de sodio o reducidas en sodio). Jugos de tomate y verduras regulares (que no sean con bajo contenido de sodio o reducidos en sodio). Pepinillos. Aceitunas. Granos Productos de panificacin hechos con grasa, como medialunas, magdalenas y algunos panes. Comidas con arroz o pasta seca listas para usar. Carnes y otras protenas Cortes de carne con alto contenido de grasa. Costillas. Carne frita. [place]ocino. [place]adela, salame y otras carnes precocidas o curadas, como embutidos o panes de carne. Grasa de la espalda del cerdo (panceta). Salchicha de cerdo. Frutos secos y semillas con sal. Frijoles enlatados con agregado de sal. Pescado enlatado o ahumado. Huevos enteros o yemas. Pollo o pavo con piel. Lcteos Leche entera o al 2 %, crema   y mitad leche y mitad crema. Queso crema entero o con toda su grasa. Yogur entero o endulzado. Quesos con toda su grasa. Sustitutos de cremas no lcteas. Coberturas batidas. Quesos para untar y quesos procesados. Grasas y aceites Mantequilla. Margarina en barra. Manteca de cerdo. Lardo. Mantequilla clarificada. Grasa de panceta. Aceites tropicales como aceite de coco, palmiste o palma. Alios y condimentos Sal de cebolla, sal de ajo, sal condimentada, sal de mesa y sal marina. Salsa Worcester[place]. Salsa trtara. Salsa barbacoa. Salsa teriyaki. Salsa de soja, incluso la que  tiene contenido reducido de sodio. Salsa de carne. Salsas en lata y envasadas. Salsa de pescado. Salsa de ostras. Salsa rosada. Rbanos picantes comprados en tiendas. [place]. Mostaza. Saborizantes y tiernizantes para carne. Caldo en cubitos. Salsas picantes. Adobos preelaborados o envasados. Aderezos para tacos preelaborados o envasados. Salsas de pepinillos. Aderezos comunes para ensalada. Otros alimentos Palomitas de maz y pretzels con sal. Es posible que los productos que se enumeran ms arriba no constituyan una lista completa de los alimentos y las bebidas que debe evitar. Consulte a un nutricionista para obtener ms informacin. Dnde buscar ms informacin National Heart, Lung, and Blood Institute (Instituto Nacional del Corazn, los Pulmones y la Sangre): www.nhlbi.nih.gov American Heart Association (Asociacin Estadounidense del Corazn): www.heart.org Academy of Nutrition and Dietetics (Academia de Nutricin y Diettica): www.eatright.org National Kidney Foundation (Fundacin Nacional del Rin): www.kidney.org Resumen El plan de alimentacin DASH ha demostrado bajar la presin arterial elevada (hipertensin). Tambin puede reducir el riesgo de diabetes tipo 2, enfermedad cardaca y accidente cerebrovascular. Cuando siga el plan de alimentacin DASH, trate de comer ms frutas frescas y verduras, cereales integrales, carnes magras, lcteos descremados y grasas cardiosaludables. Con el plan de alimentacin DASH, deber limitar el consumo de sal (sodio) a 2,300 mg por da. Si tiene hipertensin, es posible que necesite reducir la ingesta de sodio a 1,500 mg por da. Trabaje con su mdico o nutricionista para ajustar su plan alimentario a sus necesidades calricas personales. Esta informacin no tiene como fin reemplazar el consejo del mdico. Asegrese de hacerle al mdico cualquier pregunta que tenga. Document Revised: 12/12/2019 Document Reviewed: 12/12/2019 Elsevier Patient Education   2023 Elsevier Inc.  

## 2022-10-08 LAB — CMP14+EGFR
ALT: 35 IU/L — ABNORMAL HIGH (ref 0–32)
AST: 49 IU/L — ABNORMAL HIGH (ref 0–40)
Albumin/Globulin Ratio: 1.4 (ref 1.2–2.2)
Albumin: 4.3 g/dL (ref 3.9–4.9)
Alkaline Phosphatase: 112 IU/L (ref 44–121)
BUN/Creatinine Ratio: 18 (ref 12–28)
BUN: 13 mg/dL (ref 8–27)
Bilirubin Total: 0.7 mg/dL (ref 0.0–1.2)
CO2: 23 mmol/L (ref 20–29)
Calcium: 9.7 mg/dL (ref 8.7–10.3)
Chloride: 103 mmol/L (ref 96–106)
Creatinine, Ser: 0.74 mg/dL (ref 0.57–1.00)
Globulin, Total: 3 g/dL (ref 1.5–4.5)
Glucose: 113 mg/dL — ABNORMAL HIGH (ref 70–99)
Potassium: 4.5 mmol/L (ref 3.5–5.2)
Sodium: 140 mmol/L (ref 134–144)
Total Protein: 7.3 g/dL (ref 6.0–8.5)
eGFR: 90 mL/min/{1.73_m2} (ref >59)

## 2022-10-08 LAB — MICROALBUMIN / CREATININE URINE RATIO
Creatinine, Urine: 86.1 mg/dL
Microalb/Creat Ratio: 12 mg/g creat (ref 0–29)
Microalbumin, Urine: 10.6 ug/mL

## 2022-10-11 ENCOUNTER — Encounter: Payer: Self-pay | Admitting: Family Medicine

## 2022-10-11 ENCOUNTER — Telehealth: Payer: Self-pay

## 2022-10-11 NOTE — Telephone Encounter (Signed)
-----   Message from Charlott Rakes, MD sent at 10/10/2022 12:54 PM EST ----- Please inform her that her labs are stable.

## 2022-10-11 NOTE — Telephone Encounter (Signed)
Pt was called and no vm was left due to mailbox being full .Information was sent to nurse pool.   Interpreter id Clifton James [phone]

## 2022-10-17 ENCOUNTER — Encounter: Payer: Self-pay | Admitting: Family Medicine

## 2022-10-28 ENCOUNTER — Other Ambulatory Visit: Payer: Self-pay

## 2022-11-15 ENCOUNTER — Ambulatory Visit (INDEPENDENT_AMBULATORY_CARE_PROVIDER_SITE_OTHER): Payer: Self-pay | Admitting: Podiatry

## 2022-11-15 ENCOUNTER — Encounter: Payer: Self-pay | Admitting: Podiatry

## 2022-11-15 DIAGNOSIS — M722 Plantar fascial fibromatosis: Secondary | ICD-10-CM

## 2022-11-15 NOTE — Progress Notes (Signed)
Subjective:  Patient ID: Brandi Bryant, female    DOB: August 08, 1956,  MRN: [id number]  Chief Complaint  Patient presents with   Nail Problem    66 y.o. female presents with the above complaint.  Patient presents with left heel pain that has been going for last few weeks has progressive gotten worse hurts with ambulation worse with pressure she has not been able to go to work because of her pain.  She wanted to get it evaluated.  Pain scale 7 out of 10 dull aching nature.   Review of Systems: Negative except as noted in the HPI. Denies N/V/F/Ch.  Past Medical History:  Diagnosis Date   Abdominal pain    Allergy    Arthralgia 08/13/2013   Bell palsy 2006   Bell's palsy    Breast cancer ([place]) 2009   Left Breast Cancer   Cataract    Chest pain 09/30/2013   Cholelithiasis    Chronic sinusitis 05/16/2017   Colon cancer screening 81/02/4817   Complication of anesthesia    " tubal ligation, in [place]"  had weakness, couldnt stand up the next day, was given pills for oxygen for Brain."  1 month after I was having loss of attentiviness, still have to focus  carefully.    Coronary artery disease    CVA (cerebral vascular accident) ([place]) 03/30/2018   Depression    Diabetes mellitus    Type II   Dyspnea    at times- when air conditioner is runnimg, heat also    Encounter for screening colonoscopy 10/30/2015   GERD (gastroesophageal reflux disease)    Hepatic steatosis    History of breast cancer 06/01/2012   HTN (hypertension)    Hx of adenomatous polyp of colon 11/07/2019   Hyperlipidemia    Internal carotid artery stenosis 04/03/2018   Personal history of chemotherapy 2009   Left Breast Cancer   Personal history of radiation therapy 2009   Left Breast Cancer   Stroke ([place])    2006, 2015   TIA (transient ischemic attack) 02/20/2014    Current Outpatient Medications:    aspirin 325 MG EC tablet, Take 1 tablet (325 mg total) by mouth daily., Disp: 30 tablet, Rfl: 6    cyclobenzaprine (FLEXERIL) 10 MG tablet, Take 1 tablet (10 mg total) by mouth every 8 (eight) hours as needed for muscle spasms. (Patient not taking: Reported on 10/07/2022), Disp: 60 tablet, Rfl: 1   diltiazem (CARDIZEM CD) 180 MG 24 hr capsule, Take 1 capsule (180 mg total) by mouth daily., Disp: 90 capsule, Rfl: 3   Dulaglutide (TRULICITY) 1.5 HU/3.1SH SOPN, Inject 1.5 mg into the skin once a week., Disp: 2 mL, Rfl: 6   glipiZIDE (GLUCOTROL) 5 MG tablet, Take 0.5 tablets (2.5 mg total) by mouth 2 (two) times daily before a meal., Disp: 30 tablet, Rfl: 6   Insulin Pen Needle (TECHLITE PEN NEEDLES) 32G X 4 MM MISC, use to inject lantus daily, Disp: 100 each, Rfl: 0   lisinopril-hydrochlorothiazide (ZESTORETIC) 20-25 MG tablet, Take 1 tablet by mouth daily., Disp: 90 tablet, Rfl: 3   meloxicam (MOBIC) 15 MG tablet, Take 1 tablet (15 mg total) by mouth daily., Disp: 30 tablet, Rfl: 2   ondansetron (ZOFRAN-ODT) 4 MG disintegrating tablet, Take 1 tablet (4 mg total) by mouth every 8 (eight) hours as needed for nausea or vomiting., Disp: 12 tablet, Rfl: 0   pantoprazole (PROTONIX) 40 MG tablet, Take 1 tablet (40 mg total) by mouth daily., Disp: 30 tablet,  Rfl: 3   polyethylene glycol powder (GLYCOLAX/MIRALAX) 17 GM/SCOOP powder, Take 17 g by mouth daily., Disp: 3350 g, Rfl: 1   rizatriptan (MAXALT) 5 MG tablet, TAKE 1 TABLET (5 MG TOTAL) BY MOUTH AS NEEDED FOR MIGRAINE. MAY REPEAT IN 2 HOURS IF NEEDED, Disp: 10 tablet, Rfl: 2   rosuvastatin (CRESTOR) 5 MG tablet, Take 1 tablet (5 mg total) by mouth daily., Disp: 30 tablet, Rfl: 6   traZODone (DESYREL) 100 MG tablet, TAKE 1 TABLET (100 MG TOTAL) BY MOUTH AT BEDTIME AS NEEDED FOR SLEEP. (Patient taking differently: Take 100 mg by mouth at bedtime as needed for sleep.), Disp: 30 tablet, Rfl: 3  Social History   Tobacco Use  Smoking Status Never  Smokeless Tobacco Never    Allergies  Allergen Reactions   Gadolinium Derivatives Nausea And Vomiting     Code: VOM, Desc: Pt began vomiting 45 sec after MRI contrast injection of Multihance, Onset Date: [id number]     Iodinated Contrast Media Nausea And Vomiting   Objective:  There were no vitals filed for this visit. There is no height or weight on file to calculate BMI. Constitutional Well developed. Well nourished.  Vascular Dorsalis pedis pulses palpable bilaterally. Posterior tibial pulses palpable bilaterally. Capillary refill normal to all digits.  No cyanosis or clubbing noted. Pedal hair growth normal.  Neurologic Normal speech. Oriented to person, place, and time. Epicritic sensation to light touch grossly present bilaterally.  Dermatologic Nails well groomed and normal in appearance. No open wounds. No skin lesions.  Orthopedic: Normal joint ROM without pain or crepitus bilaterally. No visible deformities. Tender to palpation at the calcaneal tuber left. No pain with calcaneal squeeze left. Ankle ROM diminished range of motion left. Silfverskiold Test: positive left.   Radiographs: None  Assessment:   1. Plantar fasciitis, left    Plan:  Patient was evaluated and treated and all questions answered.  Plantar Fasciitis, left - XR reviewed as above.  - Educated on icing and stretching. Instructions given.  - Injection delivered to the plantar fascia as below. - DME: Plantar fascial brace dispensed to support the medial longitudinal arch of the foot and offload pressure from the heel and prevent arch collapse during weightbearing - Pharmacologic management: None  Procedure: Injection Tendon/Ligament Location: Left plantar fascia at the glabrous junction; medial approach. Skin Prep: alcohol Injectate: 0.5 cc 0.5% marcaine plain, 0.5 cc of 1% Lidocaine, 0.5 cc kenalog 10. Disposition: Patient tolerated procedure well. Injection site dressed with a band-aid.  No follow-ups on file.

## 2022-11-22 ENCOUNTER — Other Ambulatory Visit: Payer: Self-pay

## 2022-12-13 ENCOUNTER — Ambulatory Visit (INDEPENDENT_AMBULATORY_CARE_PROVIDER_SITE_OTHER): Payer: Self-pay | Admitting: Podiatry

## 2022-12-13 DIAGNOSIS — M722 Plantar fascial fibromatosis: Secondary | ICD-10-CM

## 2022-12-13 NOTE — Progress Notes (Signed)
Subjective:  Patient ID: Brandi Bryant, female    DOB: 1956-09-06,  MRN: [id number]  Chief Complaint  Patient presents with   Plantar Fasciitis    67 y.o. female presents with the above complaint.  Presents with follow-up of left [place] fasciitis.  She states the pain was a little bit better after the injection.  She would like to do another injection denies any other acute complaints Review of Systems: Negative except as noted in the HPI. Denies N/V/F/Ch.  Past Medical History:  Diagnosis Date   Abdominal pain    Allergy    Arthralgia 08/13/2013   Bell palsy 2006   Bell's palsy    Breast cancer ([place]) 2009   Left Breast Cancer   Cataract    Chest pain 09/30/2013   Cholelithiasis    Chronic sinusitis 05/16/2017   Colon cancer screening 95/04/2129   Complication of anesthesia    " tubal ligation, in [place]"  had weakness, couldnt stand up the next day, was given pills for oxygen for Brain."  1 month after I was having loss of attentiviness, still have to focus  carefully.    Coronary artery disease    CVA (cerebral vascular accident) ([place]) 03/30/2018   Depression    Diabetes mellitus    Type II   Dyspnea    at times- when air conditioner is runnimg, heat also    Encounter for screening colonoscopy 10/30/2015   GERD (gastroesophageal reflux disease)    Hepatic steatosis    History of breast cancer 06/01/2012   HTN (hypertension)    Hx of adenomatous polyp of colon 11/07/2019   Hyperlipidemia    Internal carotid artery stenosis 04/03/2018   Personal history of chemotherapy 2009   Left Breast Cancer   Personal history of radiation therapy 2009   Left Breast Cancer   Stroke ([place])    2006, 2015   TIA (transient ischemic attack) 02/20/2014    Current Outpatient Medications:    aspirin 325 MG EC tablet, Take 1 tablet (325 mg total) by mouth daily., Disp: 30 tablet, Rfl: 6   cyclobenzaprine (FLEXERIL) 10 MG tablet, Take 1 tablet (10 mg total) by mouth every 8 (eight)  hours as needed for muscle spasms. (Patient not taking: Reported on 10/07/2022), Disp: 60 tablet, Rfl: 1   diltiazem (CARDIZEM CD) 180 MG 24 hr capsule, Take 1 capsule (180 mg total) by mouth daily., Disp: 90 capsule, Rfl: 3   Dulaglutide (TRULICITY) 1.5 QM/5.7QI SOPN, Inject 1.5 mg into the skin once a week., Disp: 2 mL, Rfl: 6   glipiZIDE (GLUCOTROL) 5 MG tablet, Take 0.5 tablets (2.5 mg total) by mouth 2 (two) times daily before a meal., Disp: 30 tablet, Rfl: 6   Insulin Pen Needle (TECHLITE PEN NEEDLES) 32G X 4 MM MISC, use to inject lantus daily, Disp: 100 each, Rfl: 0   lisinopril-hydrochlorothiazide (ZESTORETIC) 20-25 MG tablet, Take 1 tablet by mouth daily., Disp: 90 tablet, Rfl: 3   meloxicam (MOBIC) 15 MG tablet, Take 1 tablet (15 mg total) by mouth daily., Disp: 30 tablet, Rfl: 2   ondansetron (ZOFRAN-ODT) 4 MG disintegrating tablet, Take 1 tablet (4 mg total) by mouth every 8 (eight) hours as needed for nausea or vomiting., Disp: 12 tablet, Rfl: 0   pantoprazole (PROTONIX) 40 MG tablet, Take 1 tablet (40 mg total) by mouth daily., Disp: 30 tablet, Rfl: 3   polyethylene glycol powder (GLYCOLAX/MIRALAX) 17 GM/SCOOP powder, Take 17 g by mouth daily., Disp: 3350 g, Rfl: 1  rizatriptan (MAXALT) 5 MG tablet, TAKE 1 TABLET (5 MG TOTAL) BY MOUTH AS NEEDED FOR MIGRAINE. MAY REPEAT IN 2 HOURS IF NEEDED, Disp: 10 tablet, Rfl: 2   rosuvastatin (CRESTOR) 5 MG tablet, Take 1 tablet (5 mg total) by mouth daily., Disp: 30 tablet, Rfl: 6   traZODone (DESYREL) 100 MG tablet, TAKE 1 TABLET (100 MG TOTAL) BY MOUTH AT BEDTIME AS NEEDED FOR SLEEP. (Patient taking differently: Take 100 mg by mouth at bedtime as needed for sleep.), Disp: 30 tablet, Rfl: 3  Social History   Tobacco Use  Smoking Status Never  Smokeless Tobacco Never    Allergies  Allergen Reactions   Gadolinium Derivatives Nausea And Vomiting    Code: VOM, Desc: Pt began vomiting 45 sec after MRI contrast injection of Multihance, Onset  Date: [id number]     Iodinated Contrast Media Nausea And Vomiting   Objective:  There were no vitals filed for this visit. There is no height or weight on file to calculate BMI. Constitutional Well developed. Well nourished.  Vascular Dorsalis pedis pulses palpable bilaterally. Posterior tibial pulses palpable bilaterally. Capillary refill normal to all digits.  No cyanosis or clubbing noted. Pedal hair growth normal.  Neurologic Normal speech. Oriented to person, place, and time. Epicritic sensation to light touch grossly present bilaterally.  Dermatologic Nails well groomed and normal in appearance. No open wounds. No skin lesions.  Orthopedic: Normal joint ROM without pain or crepitus bilaterally. No visible deformities. Tender to palpation at the calcaneal tuber left. No pain with calcaneal squeeze left. Ankle ROM diminished range of motion left. Silfverskiold Test: positive left.   Radiographs: None  Assessment:   No diagnosis found.  Plan:  Patient was evaluated and treated and all questions answered.  Plantar Fasciitis, left - XR reviewed as above.  - Educated on icing and stretching. Instructions given.  -Second injection delivered to the plantar fascia as below. - DME: Plantar fascial brace dispensed to support the medial longitudinal arch of the foot and offload pressure from the heel and prevent arch collapse during weightbearing - Pharmacologic management: None  Procedure: Injection Tendon/Ligament Location: Left plantar fascia at the glabrous junction; medial approach. Skin Prep: alcohol Injectate: 0.5 cc 0.5% marcaine plain, 0.5 cc of 1% Lidocaine, 0.5 cc kenalog 10. Disposition: Patient tolerated procedure well. Injection site dressed with a band-aid.  No follow-ups on file.

## 2022-12-28 ENCOUNTER — Other Ambulatory Visit: Payer: Self-pay | Admitting: Pharmacist

## 2022-12-28 ENCOUNTER — Other Ambulatory Visit: Payer: Self-pay

## 2022-12-28 NOTE — Progress Notes (Signed)
Patient seen by Angus Seller, PharmD Candidate on 12/28/22 while they were picking up prescriptions at [place] at [facility].   Blood pressure today was : 150/72, HR 79; on recheck (if >140 or >90): 148/81, HR 80   Patient has an automated home blood pressure machine. She states that she has the [employer] blood pressure cuff at home. They report home readings of around 130/75.   Medication review was performed. They are taking lisinopril/HCTZ as prescribed.   The following barriers to adherence were noted:  - They do not have cost concerns.  - They do not have transportation concerns.  - They do not need assistance obtaining refills.  - They do occasionally forget to take some of their prescribed medications. She said she forgot to take your medications yesterday, but she generally takes them everyday.  - They do not feel like one/some of their medications make them feel poorly.  - They do not have questions or concerns about their medications.  - They do have follow up scheduled with their primary care provider/cardiologist.   The following interventions were completed:  - Medications were reviewed  - Patient was educated on goal blood pressures and long term health implications of elevated blood pressure  - Patient was counseled on lifestyle modifications to improve blood pressure, including diet and exercise. She endorses taking a 20-minute walk every day. She states that she does eat a lot of fast food at work, and we discussed trying to reduce salt intake. She is trying to eat more vegetables at home.   The patient has follow up scheduled: 2/21. Told patient to discuss her BP with her PCP at this visit and see if any changes to her medications need to be made.  PCP: Dr. Marcell Anger, PharmD Candidate and Pilar Jarvis, PharmD Candidate  [employer], [employer] of 2024   Catie Hedwig Morton, PharmD, [place], [facility] [phone]

## 2023-01-10 ENCOUNTER — Ambulatory Visit: Payer: Self-pay | Admitting: Family Medicine

## 2023-01-11 ENCOUNTER — Encounter: Payer: Self-pay | Admitting: Family Medicine

## 2023-01-11 ENCOUNTER — Ambulatory Visit: Payer: Self-pay | Attending: Family Medicine | Admitting: Family Medicine

## 2023-01-11 ENCOUNTER — Other Ambulatory Visit: Payer: Self-pay

## 2023-01-11 VITALS — BP 122/68 | HR 72 | Temp 98.2°F | Ht 60.0 in | Wt 157.0 lb

## 2023-01-11 DIAGNOSIS — R42 Dizziness and giddiness: Secondary | ICD-10-CM

## 2023-01-11 DIAGNOSIS — E1140 Type 2 diabetes mellitus with diabetic neuropathy, unspecified: Secondary | ICD-10-CM

## 2023-01-11 DIAGNOSIS — A630 Anogenital (venereal) warts: Secondary | ICD-10-CM

## 2023-01-11 DIAGNOSIS — E1169 Type 2 diabetes mellitus with other specified complication: Secondary | ICD-10-CM

## 2023-01-11 DIAGNOSIS — Z794 Long term (current) use of insulin: Secondary | ICD-10-CM

## 2023-01-11 DIAGNOSIS — I10 Essential (primary) hypertension: Secondary | ICD-10-CM

## 2023-01-11 DIAGNOSIS — Z1231 Encounter for screening mammogram for malignant neoplasm of breast: Secondary | ICD-10-CM

## 2023-01-11 LAB — POCT GLYCOSYLATED HEMOGLOBIN (HGB A1C): HbA1c, POC (controlled diabetic range): 6.1 % (ref 0.0–7.0)

## 2023-01-11 MED ORDER — DILTIAZEM HCL ER COATED BEADS 180 MG PO CP24
180.0000 mg | ORAL_CAPSULE | Freq: Every day | ORAL | 1 refills | Status: DC
  Filled 2023-01-11: qty 90, 90d supply, fill #0
  Filled 2023-01-20: qty 30, 30d supply, fill #0
  Filled 2023-02-22: qty 30, 30d supply, fill #1
  Filled 2023-03-20: qty 30, 30d supply, fill #2
  Filled 2023-04-24: qty 30, 30d supply, fill #3
  Filled 2023-05-22: qty 30, 30d supply, fill #4
  Filled 2023-06-20: qty 30, 30d supply, fill #5

## 2023-01-11 MED ORDER — ATORVASTATIN CALCIUM 40 MG PO TABS
40.0000 mg | ORAL_TABLET | Freq: Every day | ORAL | 1 refills | Status: DC
  Filled 2023-01-11: qty 90, 90d supply, fill #0
  Filled 2023-04-24: qty 90, 90d supply, fill #1

## 2023-01-11 MED ORDER — GLIPIZIDE 5 MG PO TABS
2.5000 mg | ORAL_TABLET | Freq: Two times a day (BID) | ORAL | 1 refills | Status: DC
  Filled 2023-01-11 – 2023-01-20 (×2): qty 45, 45d supply, fill #0
  Filled 2023-02-22: qty 45, 45d supply, fill #1

## 2023-01-11 MED ORDER — MECLIZINE HCL 25 MG PO TABS
25.0000 mg | ORAL_TABLET | Freq: Three times a day (TID) | ORAL | 1 refills | Status: DC | PRN
  Filled 2023-01-11: qty 60, 20d supply, fill #0

## 2023-01-11 MED ORDER — CLOTRIMAZOLE 1 % EX CREA
1.0000 | TOPICAL_CREAM | Freq: Two times a day (BID) | CUTANEOUS | 0 refills | Status: DC
  Filled 2023-01-11: qty 30, 15d supply, fill #0

## 2023-01-11 NOTE — Progress Notes (Signed)
Subjective:  Patient ID: Brandi Bryant, female    DOB: 11-05-1956  Age: 67 y.o. MRN: [id number]  CC: Diabetes   HPI Brandi Bryant is a 67 y.o. year old female with a history of GERD, hypertension, hyperlipidemia, breast cancer status post left lumpectomy and radiation, type 2 diabetes mellitus (A1c 6.1) previous CVA, TIA in 03/2018.    Interval History: Brandi Bryant had a visit with Dr. Posey Pronto for plantar fasciitis one month ago.  Complains that her heel still hurt.  Brandi Bryant Complains of Crestor causing headaches which are worse at night. Brandi Bryant initially thought it was a migraine but then discontinued it for some time and headaches resolved.  While walking Brandi Bryant finds herself losing her balance, tripping and bumping into things for the last few months. Of note Brandi Bryant has had a previous CVA and TIA. Brandi Bryant denies presence of the room spinning. Brandi Bryant states symptoms have not always been there since her stroke.  Brandi Bryant endorses the room spinning.  Brandi Bryant is adherent with her diabetes medications and has not experienced hypoglycemia.  A1c is 6.1. Brandi Bryant complains of an itchy lesion which Brandi Bryant has had for a while on her labia Past Medical History:  Diagnosis Date   Abdominal pain    Allergy    Arthralgia 08/13/2013   Bell palsy 2006   Bell's palsy    Breast cancer ([place]) 2009   Left Breast Cancer   Cataract    Chest pain 09/30/2013   Cholelithiasis    Chronic sinusitis 05/16/2017   Colon cancer screening [id number]   Complication of anesthesia    " tubal ligation, in [place]"  had weakness, couldnt stand up the next day, was given pills for oxygen for Brain."  1 month after I was having loss of attentiviness, still have to focus  carefully.    Coronary artery disease    CVA (cerebral vascular accident) ([place]) 03/30/2018   Depression    Diabetes mellitus    Type II   Dyspnea    at times- when air conditioner is runnimg, heat also    Encounter for screening colonoscopy 10/30/2015   GERD  (gastroesophageal reflux disease)    Hepatic steatosis    History of breast cancer 06/01/2012   HTN (hypertension)    Hx of adenomatous polyp of colon 11/07/2019   Hyperlipidemia    Internal carotid artery stenosis 04/03/2018   Personal history of chemotherapy 2009   Left Breast Cancer   Personal history of radiation therapy 2009   Left Breast Cancer   Stroke [facility])    2006, 2015   TIA (transient ischemic attack) 02/20/2014    Past Surgical History:  Procedure Laterality Date   ANKLE ARTHROSCOPY Right 12/14/2016   Procedure: Right Ankle Arthroscopic Debridement;  Surgeon: Newt Minion, MD;  Location: [place];  Service: Orthopedics;  Laterality: Right;   Arm surgery     Left tendon lengthen   BREAST LUMPECTOMY Left 2009   CHOLECYSTECTOMY N/A 12/09/2015   Procedure: LAPAROSCOPIC CHOLECYSTECTOMY WITH INTRAOPERATIVE CHOLANGIOGRAM;  Surgeon: Greer Pickerel, MD;  Location: [place];  Service: General;  Laterality: N/A;   LEFT HEART CATHETERIZATION WITH CORONARY ANGIOGRAM N/A 10/30/2013   Procedure: LEFT HEART CATHETERIZATION WITH CORONARY ANGIOGRAM;  Surgeon: Josue Hector, MD;  Location: [facility] CATH LAB;  Service: Cardiovascular;  Laterality: N/A;   porta cath Right    Removal of Porta cath Right    TUBAL LIGATION      Family History  Problem Relation Age of Onset   Thyroid  cancer Sister    Hypertension Mother    Diabetes Mother    Migraines Daughter    Colon cancer Neg Hx    Esophageal cancer Neg Hx    Stomach cancer Neg Hx    Rectal cancer Neg Hx     Social History   Socioeconomic History   Marital status: Single    Spouse name: Not on file   Number of children: 6   Years of education: Not on file   Highest education level: High school graduate  Occupational History   Not on file  Tobacco Use   Smoking status: Never   Smokeless tobacco: Never  Vaping Use   Vaping Use: Never used  Substance and Sexual Activity   Alcohol use: No   Drug use: No   Sexual activity: Not Currently     Birth control/protection: Surgical  Other Topics Concern   Not on file  Social History Narrative   Single with 6 children    lives with her daughter   Right handed   Never smoker no drug use no alcohol   Social Determinants of Health   Financial Resource Strain: Not on file  Food Insecurity: Not on file  Transportation Needs: No Transportation Needs (08/06/2019)   PRAPARE - Hydrologist (Medical): No    Lack of Transportation (Non-Medical): No  Physical Activity: Insufficiently Active (01/25/2018)   Exercise Vital Sign    Days of Exercise per Week: 2 days    Minutes of Exercise per Session: 20 min  Stress: Stress Concern Present (01/25/2018)   [place]    Feeling of Stress : Very much  Social Connections: Moderately Isolated (01/25/2018)   Social Connection and Isolation Panel [NHANES]    Frequency of Communication with Friends and Family: More than three times a week    Frequency of Social Gatherings with Friends and Family: More than three times a week    Attends Religious Services: Never    Marine scientist or Organizations: No    Attends Archivist Meetings: Never    Marital Status: Divorced    Allergies  Allergen Reactions   Gadolinium Derivatives Nausea And Vomiting    Code: VOM, Desc: Pt began vomiting 45 sec after MRI contrast injection of Multihance, Onset Date: [id number]     Iodinated Contrast Media Nausea And Vomiting    Outpatient Medications Prior to Visit  Medication Sig Dispense Refill   aspirin 325 MG EC tablet Take 1 tablet (325 mg total) by mouth daily. 30 tablet 6   cyclobenzaprine (FLEXERIL) 10 MG tablet Take 1 tablet (10 mg total) by mouth every 8 (eight) hours as needed for muscle spasms. 60 tablet 1   Dulaglutide (TRULICITY) 1.5 0000000 SOPN Inject 1.5 mg into the skin once a week. 2 mL 6   Insulin Pen Needle (TECHLITE PEN NEEDLES) 32G X 4  MM MISC use to inject lantus daily 100 each 0   lisinopril-hydrochlorothiazide (ZESTORETIC) 20-25 MG tablet Take 1 tablet by mouth daily. 90 tablet 3   meloxicam (MOBIC) 15 MG tablet Take 1 tablet (15 mg total) by mouth daily. 30 tablet 2   ondansetron (ZOFRAN-ODT) 4 MG disintegrating tablet Take 1 tablet (4 mg total) by mouth every 8 (eight) hours as needed for nausea or vomiting. 12 tablet 0   pantoprazole (PROTONIX) 40 MG tablet Take 1 tablet (40 mg total) by mouth daily. 30 tablet 3  polyethylene glycol powder (GLYCOLAX/MIRALAX) 17 GM/SCOOP powder Take 17 g by mouth daily. 3350 g 1   rizatriptan (MAXALT) 5 MG tablet TAKE 1 TABLET (5 MG TOTAL) BY MOUTH AS NEEDED FOR MIGRAINE. MAY REPEAT IN 2 HOURS IF NEEDED 10 tablet 2   diltiazem (CARDIZEM CD) 180 MG 24 hr capsule Take 1 capsule (180 mg total) by mouth daily. 90 capsule 3   glipiZIDE (GLUCOTROL) 5 MG tablet Take 0.5 tablets (2.5 mg total) by mouth 2 (two) times daily before a meal. 30 tablet 6   rosuvastatin (CRESTOR) 5 MG tablet Take 1 tablet (5 mg total) by mouth daily. 30 tablet 6   traZODone (DESYREL) 100 MG tablet TAKE 1 TABLET (100 MG TOTAL) BY MOUTH AT BEDTIME AS NEEDED FOR SLEEP. (Patient taking differently: Take 100 mg by mouth at bedtime as needed for sleep.) 30 tablet 3   No facility-administered medications prior to visit.     ROS Review of Systems  Constitutional:  Negative for activity change and appetite change.  HENT:  Negative for sinus pressure and sore throat.   Respiratory:  Negative for chest tightness, shortness of breath and wheezing.   Cardiovascular:  Negative for chest pain and palpitations.  Gastrointestinal:  Negative for abdominal distention, abdominal pain and constipation.  Genitourinary: Negative.   Musculoskeletal:        See HPI  Neurological:  Positive for light-headedness.  Psychiatric/Behavioral:  Negative for behavioral problems and dysphoric mood.     Objective:  BP 122/68   Pulse 72   Temp  98.2 F (36.8 C) (Oral)   Ht 5' (1.524 m)   Wt 157 lb (71.2 kg)   SpO2 97%   BMI 30.66 kg/m      01/11/2023    1:42 PM 12/28/2022    3:44 PM 12/28/2022    3:43 PM  BP/Weight  Systolic BP [id number]  Diastolic BP 68 81 72  Wt. (Lbs) 157    BMI 30.66 kg/m2        Physical Exam Constitutional:      Appearance: Brandi Bryant is well-developed.  HENT:     Head:     Comments: Positive Dix-Hallpike maneuver    Right Ear: Tympanic membrane normal.     Left Ear: Tympanic membrane normal.  Cardiovascular:     Rate and Rhythm: Normal rate.     Heart sounds: Normal heart sounds. No murmur heard. Pulmonary:     Effort: Pulmonary effort is normal.     Breath sounds: Normal breath sounds. No wheezing or rales.  Chest:     Chest wall: No tenderness.  Abdominal:     General: Bowel sounds are normal. There is no distension.     Palpations: Abdomen is soft. There is no mass.     Tenderness: There is no abdominal tenderness.  Genitourinary:    Comments: Left labia with pedunculated hyperpigmented lesion with corrugated surface Musculoskeletal:        General: Normal range of motion.     Right lower leg: No edema.     Left lower leg: No edema.  Neurological:     Mental Status: Brandi Bryant is alert and oriented to person, place, and time.  Psychiatric:        Mood and Affect: Mood normal.        Latest Ref Rng & Units 10/07/2022   11:50 AM 03/07/2022   10:42 PM 01/16/2022    8:01 AM  CMP  Glucose 70 - 99 mg/dL 113  264  89   BUN 8 - 27 mg/dL 13  12  9   $ Creatinine 0.57 - 1.00 mg/dL 0.74  0.80  0.64   Sodium 134 - 144 mmol/L 140  137  141   Potassium 3.5 - 5.2 mmol/L 4.5  4.0  3.9   Chloride 96 - 106 mmol/L 103  108  114   CO2 20 - 29 mmol/L 23  22  18   $ Calcium 8.7 - 10.3 mg/dL 9.7  8.9  8.9   Total Protein 6.0 - 8.5 g/dL 7.3   6.9   Total Bilirubin 0.0 - 1.2 mg/dL 0.7   0.7   Alkaline Phos 44 - 121 IU/L 112   63   AST 0 - 40 IU/L 49   35   ALT 0 - 32 IU/L 35   26     Lipid Panel      Component Value Date/Time   CHOL 126 02/03/2022 1059   TRIG 92 02/03/2022 1059   HDL 57 02/03/2022 1059   CHOLHDL 2.2 02/03/2022 1059   CHOLHDL 2.6 03/31/2018 0348   VLDL 33 03/31/2018 0348   LDLCALC 52 02/03/2022 1059    CBC    Component Value Date/Time   WBC 6.9 03/07/2022 2242   RBC 4.44 03/07/2022 2242   HGB 13.3 03/07/2022 2242   HGB 13.4 09/30/2013 1033   HCT 39.7 03/07/2022 2242   HCT 39.1 09/30/2013 1033   PLT 153 03/07/2022 2242   PLT 150 09/30/2013 1033   MCV 89.4 03/07/2022 2242   MCV 88.5 09/30/2013 1033   MCH 30.0 03/07/2022 2242   MCHC 33.5 03/07/2022 2242   RDW 12.3 03/07/2022 2242   RDW 12.8 09/30/2013 1033   LYMPHSABS 2.2 03/07/2022 2242   LYMPHSABS 2.1 09/30/2013 1033   MONOABS 0.7 03/07/2022 2242   MONOABS 0.4 09/30/2013 1033   EOSABS 0.5 03/07/2022 2242   EOSABS 0.2 09/30/2013 1033   BASOSABS 0.0 03/07/2022 2242   BASOSABS 0.0 09/30/2013 1033    Lab Results  Component Value Date   HGBA1C 6.1 01/11/2023    Assessment & Plan:   1. Type 2 diabetes mellitus with other specified complication, with long-term current use of insulin (HCC) Controlled with A1c of 6.1 Continue current regimen Counseled on Diabetic diet, my plate method, [id number] minutes of moderate intensity exercise/week Blood sugar logs with fasting goals of 80-120 mg/dl, random of less than 180 and in the event of sugars less than 60 mg/dl or greater than 400 mg/dl encouraged to notify the clinic. Advised on the need for annual eye exams, annual foot exams, Pneumonia vaccine. - POCT glycosylated hemoglobin (Hb A1C) - atorvastatin (LIPITOR) 40 MG tablet; Take 1 tablet (40 mg total) by mouth daily.  Dispense: 90 tablet; Refill: 1 - CMP14+EGFR  2. Encounter for screening mammogram for malignant neoplasm of breast - MM 3D SCREEN BREAST BILATERAL; Future  3. Type 2 diabetes mellitus with diabetic neuropathy, with long-term current use of insulin (HCC) Stable Brandi Bryant is doing well without  medications for neuropathy. - glipiZIDE (GLUCOTROL) 5 MG tablet; Take 0.5 tablets (2.5 mg total) by mouth 2 (two) times daily before a meal.  Dispense: 45 tablet; Refill: 1  4. Vertigo Counseled on pathophysiology of vertigo Will initiate meclizine - meclizine (ANTIVERT) 25 MG tablet; Take 1 tablet (25 mg total) by mouth 3 (three) times daily as needed for dizziness.  Dispense: 60 tablet; Refill: 1  5. Vulvar warts Brandi Bryant will need further evaluation by GYN and  excision to exclude malignancy Will place on clotrimazole as Brandi Bryant complains of itching - Ambulatory referral to Gynecology  6. Primary hypertension Controlled - diltiazem (CARDIZEM CD) 180 MG 24 hr capsule; Take 1 capsule (180 mg total) by mouth daily.  Dispense: 90 capsule; Refill: 1   Meds ordered this encounter  Medications   diltiazem (CARDIZEM CD) 180 MG 24 hr capsule    Sig: Take 1 capsule (180 mg total) by mouth daily.    Dispense:  90 capsule    Refill:  1   glipiZIDE (GLUCOTROL) 5 MG tablet    Sig: Take 0.5 tablets (2.5 mg total) by mouth 2 (two) times daily before a meal.    Dispense:  45 tablet    Refill:  1   atorvastatin (LIPITOR) 40 MG tablet    Sig: Take 1 tablet (40 mg total) by mouth daily.    Dispense:  90 tablet    Refill:  1    Discontinue Crestor   meclizine (ANTIVERT) 25 MG tablet    Sig: Take 1 tablet (25 mg total) by mouth 3 (three) times daily as needed for dizziness.    Dispense:  60 tablet    Refill:  1   clotrimazole (LOTRIMIN) 1 % cream    Sig: Apply 1 Application topically 2 (two) times daily.    Dispense:  30 g    Refill:  0    Follow-up: Return in about 3 months (around 04/11/2023).       Charlott Rakes, MD, FAAFP. [facility] and [place], [place]   01/11/2023, 4:59 PM

## 2023-01-11 NOTE — Patient Instructions (Signed)
Vrtigo Vertigo El vrtigo es la sensacin de que usted o las cosas que lo rodean se mueven cuando en realidad eso no sucede. Esta sensacin puede aparecer y desaparecer en cualquier momento. El vrtigo suele desaparecer solo. Esta afeccin puede ser peligrosa si ocurre cuando est realizando actividades como conducir o trabajar con mquinas. El mdico le har pruebas para encontrar la causa del vrtigo. Estas pruebas tambin ayudarn al mdico a decidir cul es el mejor tratamiento para usted. Siga estas instrucciones en su casa: Comida y bebida     Beba suficiente lquido para mantener el pis (orina) de color amarillo plido. No beba alcohol. Actividad Retome sus actividades normales cuando el mdico le diga que es seguro. Por la maana, sintese primero a un lado de la cama. Cuando se sienta bien, pngase lentamente de pie mientras se sostiene de algo, hasta que sepa que ha logrado el equilibrio. Muvase lentamente. Evite determinadas posiciones o hacer movimientos repentinos del cuerpo o de la cabeza, como se lo haya indicado el mdico. Use un bastn si tiene dificultad para ponerse de pie o caminar. Si se siente mareado, sintese de inmediato. Evite realizar tareas o actividades que puedan causarle peligro a usted o a Producer, television/film/video si se marea. Evite agacharse si se siente mareado. En su casa, coloque los objetos de modo que le resulte fcil alcanzarlos sin doblarse o agacharse. No conduzca vehculos ni opere maquinaria si se siente mareado. Instrucciones generales Use los medicamentos de venta libre y los recetados solamente como se lo haya indicado el mdico. Concurra a todas las visitas de seguimiento. Comunquese con un mdico si: Los medicamentos no le [employer] vrtigo. Los problemas empeoran o le aparecen sntomas nuevos. Tiene fiebre. Siente ganas de vomitar (nuseas) o estas ganas empeoran. Comienza a vomitar. Sus familiares o amigos observan cambios en su manera de  [place]. Pierde la sensibilidad (tiene entumecimiento) en alguna parte del cuerpo. Siente pinchazos u hormigueo en alguna parte del cuerpo. Solicite ayuda de inmediato si: Esta mareado todo [employer]. Se desmaya. Tiene dolores de [place] muy intensos. Tiene rigidez en el cuello. La luz brillante empieza a molestarle. Tiene dificultad para moverse o para caminar. Siente debilidad [employer], los brazos o las piernas. Presenta cambios en la audicin o la manera de ver (visin). Estos sntomas pueden Sales executive. Solicite ayuda de inmediato. Comunquese con el servicio de emergencias de su localidad (911 en los [place]). No espere a ver si los sntomas desaparecen. No conduzca por sus propios medios [employer]. Resumen El vrtigo es la sensacin de que usted o las cosas que lo rodean se mueven cuando en realidad eso no sucede. El mdico le har pruebas para encontrar la causa del vrtigo. Tal vez le indiquen que evite algunas tareas, posiciones o movimientos. Comunquese con un mdico si los medicamentos no lo ayudan o si tiene [place], sntomas nuevos o un cambio en su manera de actuar. Pida ayuda de inmediato si tiene dolores de cabeza muy intensos o si tiene cambios en la manera de hablar, or o ver. Esta informacin no tiene Marine scientist el consejo del mdico. Asegrese de hacerle al mdico cualquier pregunta que tenga. Document Revised: 11/02/2020 Document Reviewed: 11/02/2020 Elsevier Patient Education  [place].

## 2023-01-11 NOTE — Progress Notes (Signed)
Needs referral to get eyes checked

## 2023-01-12 ENCOUNTER — Other Ambulatory Visit: Payer: Self-pay

## 2023-01-12 LAB — CMP14+EGFR
ALT: 37 IU/L — ABNORMAL HIGH (ref 0–32)
AST: 51 IU/L — ABNORMAL HIGH (ref 0–40)
Albumin/Globulin Ratio: 1.5 (ref 1.2–2.2)
Albumin: 4.3 g/dL (ref 3.9–4.9)
Alkaline Phosphatase: 96 IU/L (ref 44–121)
BUN/Creatinine Ratio: 19 (ref 12–28)
BUN: 16 mg/dL (ref 8–27)
Bilirubin Total: 0.8 mg/dL (ref 0.0–1.2)
CO2: 24 mmol/L (ref 20–29)
Calcium: 9.8 mg/dL (ref 8.7–10.3)
Chloride: 104 mmol/L (ref 96–106)
Creatinine, Ser: 0.84 mg/dL (ref 0.57–1.00)
Globulin, Total: 2.9 g/dL (ref 1.5–4.5)
Glucose: 154 mg/dL — ABNORMAL HIGH (ref 70–99)
Potassium: 4.2 mmol/L (ref 3.5–5.2)
Sodium: 144 mmol/L (ref 134–144)
Total Protein: 7.2 g/dL (ref 6.0–8.5)
eGFR: 77 mL/min/{1.73_m2} (ref >59)

## 2023-01-16 ENCOUNTER — Other Ambulatory Visit: Payer: Self-pay

## 2023-01-17 ENCOUNTER — Other Ambulatory Visit: Payer: Self-pay

## 2023-01-19 ENCOUNTER — Other Ambulatory Visit (HOSPITAL_COMMUNITY)
Admission: RE | Admit: 2023-01-19 | Discharge: 2023-01-19 | Disposition: A | Discharge status: Home / Self Care | Payer: Self-pay | Source: Ambulatory Visit | Attending: Family Medicine | Admitting: Family Medicine

## 2023-01-19 ENCOUNTER — Encounter: Payer: Self-pay | Admitting: Family Medicine

## 2023-01-19 ENCOUNTER — Other Ambulatory Visit: Payer: Self-pay

## 2023-01-19 ENCOUNTER — Ambulatory Visit (INDEPENDENT_AMBULATORY_CARE_PROVIDER_SITE_OTHER): Payer: Self-pay | Admitting: Family Medicine

## 2023-01-19 VITALS — BP 130/68 | HR 76 | Wt 155.5 lb

## 2023-01-19 DIAGNOSIS — Z01419 Encounter for gynecological examination (general) (routine) without abnormal findings: Secondary | ICD-10-CM

## 2023-01-19 DIAGNOSIS — N949 Unspecified condition associated with female genital organs and menstrual cycle: Secondary | ICD-10-CM | POA: Insufficient documentation

## 2023-01-19 NOTE — Progress Notes (Signed)
GYNECOLOGY OFFICE VISIT NOTE  History:   Brandi Bryant is a 67 y.o. [id number] here today for evaluation of genital lesion. Located on her left labia. Present for ~1 month and a/w foul odor and vaginal discharge. She is sexually active with one partner, unprotected. She denies any bleeding, pelvic pain. Fever, chills, changes in weight or BM, or other concerns.    Past Medical History:  Diagnosis Date   Abdominal pain    Allergy    Arthralgia 08/13/2013   Bell palsy 2006   Bell's palsy    Breast cancer ([place]) 2009   Left Breast Cancer   Cataract    Chest pain 09/30/2013   Cholelithiasis    Chronic sinusitis 05/16/2017   Colon cancer screening [id number]   Complication of anesthesia    " tubal ligation, in [place]"  had weakness, couldnt stand up the next day, was given pills for oxygen for Brain."  1 month after I was having loss of attentiviness, still have to focus  carefully.    Coronary artery disease    CVA (cerebral vascular accident) ([place]) 03/30/2018   Depression    Diabetes mellitus    Type II   Dyspnea    at times- when air conditioner is runnimg, heat also    Encounter for screening colonoscopy 10/30/2015   GERD (gastroesophageal reflux disease)    Hepatic steatosis    History of breast cancer 06/01/2012   HTN (hypertension)    Hx of adenomatous polyp of colon 11/07/2019   Hyperlipidemia    Internal carotid artery stenosis 04/03/2018   Personal history of chemotherapy 2009   Left Breast Cancer   Personal history of radiation therapy 2009   Left Breast Cancer   Stroke [facility])    2006, 2015   TIA (transient ischemic attack) 02/20/2014    Past Surgical History:  Procedure Laterality Date   ANKLE ARTHROSCOPY Right 12/14/2016   Procedure: Right Ankle Arthroscopic Debridement;  Surgeon: Newt Minion, MD;  Location: [place];  Service: Orthopedics;  Laterality: Right;   Arm surgery     Left tendon lengthen   BREAST LUMPECTOMY Left 2009   CHOLECYSTECTOMY N/A  12/09/2015   Procedure: LAPAROSCOPIC CHOLECYSTECTOMY WITH INTRAOPERATIVE CHOLANGIOGRAM;  Surgeon: Greer Pickerel, MD;  Location: [place];  Service: General;  Laterality: N/A;   LEFT HEART CATHETERIZATION WITH CORONARY ANGIOGRAM N/A 10/30/2013   Procedure: LEFT HEART CATHETERIZATION WITH CORONARY ANGIOGRAM;  Surgeon: Josue Hector, MD;  Location: [facility] CATH LAB;  Service: Cardiovascular;  Laterality: N/A;   porta cath Right    Removal of Porta cath Right    TUBAL LIGATION      The following portions of the patient's history were reviewed and updated as appropriate: allergies, current medications, past family history, past medical history, past social history, past surgical history and problem list.   Health Maintenance:  Normal pap and negative HRHPV on 08/06/19.  Normal mammogram on 08/14/2019- new one ordered by PCP on 01/11/23.   Review of Systems:  Pertinent items noted in HPI and remainder of comprehensive ROS otherwise negative.  Physical Exam:  BP 130/68   Pulse 76   Wt 155 lb 8 oz (70.5 kg)   BMI 30.37 kg/m  CONSTITUTIONAL: Well-developed, well-nourished female in no acute distress.  HEENT:  Normocephalic, atraumatic. External right and left ear normal. No scleral icterus.  NECK: Normal range of motion, supple, no masses noted on observation SKIN: No rash noted. Not diaphoretic. No erythema. No pallor. MUSCULOSKELETAL: Normal range  of motion. No edema noted. NEUROLOGIC: Alert and oriented to person, place, and time. Normal muscle tone coordination.  PSYCHIATRIC: Normal mood and affect. Normal behavior. Normal judgment and thought content. CARDIOVASCULAR: Normal heart rate noted RESPIRATORY: Effort and breath sounds normal, no problems with respiration noted ABDOMEN: No masses noted. No other overt distention noted.   PELVIC:  1 cm pedunculated, hyperpigmented plaque on L labia majora. Noted multiple hyperpigmented macules on the bilateral labia majora. White discharge per vagina, no  bleeding, no CMT, vaginal mucosa intact.   Labs and Imaging Results for orders placed or performed in visit on 01/19/23 (from the past 168 hour(s))  HIV antibody (with reflex)   Collection Time: 01/19/23  3:03 PM  Result Value Ref Range   HIV Screen 4th Generation wRfx Non Reactive Non Reactive  RPR   Collection Time: 01/19/23  3:03 PM  Result Value Ref Range   RPR Ser Ql Non Reactive Non Reactive  Cervicovaginal ancillary only( [place])   Collection Time: 01/19/23  4:31 PM  Result Value Ref Range   Neisseria Gonorrhea Negative    Chlamydia Negative    Trichomonas Negative    Bacterial Vaginitis (gardnerella) Positive (A)    Comment      Normal Reference Range Bacterial Vaginosis - Negative   Comment Normal Reference Range Trichomonas - Negative    Comment Normal Reference [place] Chlamydia - Negative    Comment      Normal Reference Range Neisseria Gonorrhea - Negative   Skin BIOPSY NOTE The indications for skin lesion biopsy rule out neoplasia were reviewed.   Risks of the biopsy including pain, bleeding, infection, inadequate specimen, scarring and need for additional procedures  were discussed. The patient stated understanding and agreed to undergo procedure today. Consent was signed,  time out performed.   The patient's L labia majora was prepped with Betadine. 1% lidocaine was injected into L labia. A 1-cm  shave biopsy was done with scalpel, biopsy tissue was picked up with sterile forceps.  Small bleeding was noted and hemostasis was achieved using silver nitrate sticks and 3-0 vicryl suture.  The patient tolerated the procedure well.   Post-procedure instructions (pelvic rest for one week) were given to the patient. The patient is to call with heavy bleeding, fever greater than 100.4, foul smelling vaginal discharge or other concerns. The patient will be return to clinic in two weeks for discussion of results.  Assessment and Plan:      1. Genital lesion, female -  Surgical pathology( [place]/ [facility]) - HIV antibody (with reflex) - RPR - Cervicovaginal ancillary only( [place])  2. Encounter for cervical Pap smear with pelvic exam - Cervicovaginal ancillary only( [place]) - Cytology - PAP( [place])    Pt presented for genital lesion concern for 1 month. Given presentation and pt's age, concern for condyloma accumintum. Skin bx performed to r/o malignancy. Pap smear and STD swab colleted. STD labs collected as well. Will f/up with pt as soon as results come back.   Routine preventative health maintenance measures emphasized. Please refer to After Visit Summary for other counseling recommendations.   Return in about 1 week (around 01/26/2023) for genital wart.    Shelda Pal, DO OB Fellow, [place] for [place] 01/19/2023 3:01 PM

## 2023-01-20 ENCOUNTER — Other Ambulatory Visit: Payer: Self-pay

## 2023-01-20 ENCOUNTER — Other Ambulatory Visit: Payer: Self-pay | Admitting: Family Medicine

## 2023-01-20 DIAGNOSIS — Z794 Long term (current) use of insulin: Secondary | ICD-10-CM

## 2023-01-20 LAB — CERVICOVAGINAL ANCILLARY ONLY
Bacterial Vaginitis (gardnerella): POSITIVE — AB
Chlamydia: NEGATIVE
Comment: NEGATIVE
Comment: NEGATIVE
Comment: NEGATIVE
Comment: NORMAL
Neisseria Gonorrhea: NEGATIVE
Trichomonas: NEGATIVE

## 2023-01-20 LAB — RPR: RPR Ser Ql: NONREACTIVE

## 2023-01-20 LAB — HIV ANTIBODY (ROUTINE TESTING W REFLEX): HIV Screen 4th Generation wRfx: NONREACTIVE

## 2023-01-20 MED ORDER — TRULICITY 1.5 MG/0.5ML ~~LOC~~ SOAJ
1.5000 mg | SUBCUTANEOUS | 6 refills | Status: DC
  Filled 2023-01-20: qty 2, 28d supply, fill #0
  Filled 2023-02-22: qty 2, 28d supply, fill #1
  Filled 2023-03-20: qty 2, 28d supply, fill #2
  Filled 2023-04-24: qty 2, 28d supply, fill #3
  Filled 2023-05-22: qty 2, 28d supply, fill #4
  Filled 2023-06-20: qty 2, 28d supply, fill #5
  Filled 2023-07-17: qty 2, 28d supply, fill #6

## 2023-01-21 ENCOUNTER — Other Ambulatory Visit: Payer: Self-pay | Admitting: Family Medicine

## 2023-01-21 MED ORDER — METRONIDAZOLE 500 MG PO TABS
500.0000 mg | ORAL_TABLET | Freq: Two times a day (BID) | ORAL | 0 refills | Status: AC
  Filled 2023-01-21: qty 14, 7d supply, fill #0

## 2023-01-23 ENCOUNTER — Other Ambulatory Visit: Payer: Self-pay

## 2023-01-23 ENCOUNTER — Telehealth: Payer: Self-pay | Admitting: General Practice

## 2023-01-23 LAB — SURGICAL PATHOLOGY

## 2023-01-23 NOTE — Telephone Encounter (Signed)
-----   Message from Shelda Pal, DO sent at 01/21/2023 11:06 AM EST ----- Attempted to call pt to let her know that she has BV and a medication has been sent to her pharmacy.  Please call her to let her know this information.   The medication (metronidazole '500mg'$  BID) was sent to [place] on 01/21/23.

## 2023-01-23 NOTE — Telephone Encounter (Signed)
Called patient with Brandi Bryant assisting with spanish interpretation & informed her of results as well as medication instructions. Patient verbalized understanding. Also confirmed upcoming appt on 3/7.

## 2023-01-24 ENCOUNTER — Other Ambulatory Visit: Payer: Self-pay

## 2023-01-24 LAB — CYTOLOGY - PAP
Comment: NEGATIVE
Diagnosis: UNDETERMINED — AB
High risk HPV: NEGATIVE

## 2023-01-26 ENCOUNTER — Ambulatory Visit (INDEPENDENT_AMBULATORY_CARE_PROVIDER_SITE_OTHER): Payer: Self-pay | Admitting: Certified Nurse Midwife

## 2023-01-26 ENCOUNTER — Encounter: Payer: Self-pay | Admitting: Certified Nurse Midwife

## 2023-01-26 VITALS — BP 120/64 | HR 74 | Wt 157.0 lb

## 2023-01-26 DIAGNOSIS — A630 Anogenital (venereal) warts: Secondary | ICD-10-CM

## 2023-01-26 DIAGNOSIS — N760 Acute vaginitis: Secondary | ICD-10-CM

## 2023-01-26 DIAGNOSIS — R87610 Atypical squamous cells of undetermined significance on cytologic smear of cervix (ASC-US): Secondary | ICD-10-CM

## 2023-01-26 DIAGNOSIS — Z758 Other problems related to medical facilities and other health care: Secondary | ICD-10-CM

## 2023-01-26 DIAGNOSIS — Z789 Other specified health status: Secondary | ICD-10-CM

## 2023-01-26 DIAGNOSIS — B9689 Other specified bacterial agents as the cause of diseases classified elsewhere: Secondary | ICD-10-CM

## 2023-01-26 DIAGNOSIS — Z712 Person consulting for explanation of examination or test findings: Secondary | ICD-10-CM

## 2023-01-28 DIAGNOSIS — R87610 Atypical squamous cells of undetermined significance on cytologic smear of cervix (ASC-US): Secondary | ICD-10-CM | POA: Insufficient documentation

## 2023-01-28 DIAGNOSIS — A630 Anogenital (venereal) warts: Secondary | ICD-10-CM | POA: Insufficient documentation

## 2023-01-28 NOTE — Progress Notes (Signed)
History:  [name] is a 67 y.o. [phone] who presents to clinic today for follow up on a genial lesion that was removed 1 week ago. Patient has no complaints today. States place where biopsy was performed has healed well.     The following portions of the patient's history were reviewed and updated as appropriate: allergies, current medications, family history, past medical history, social history, past surgical history and problem list.  Review of Systems:  Review of Systems  Genitourinary:        Abnormal lesion previously removed .  All other systems reviewed and are negative.     Objective:  Physical Exam BP 120/64   Pulse 74   Wt 157 lb (71.2 kg)   BMI 30.66 kg/m  Physical Exam Constitutional:      General: She is not in acute distress.    Appearance: Normal appearance.  HENT:     Head: Normocephalic.  Cardiovascular:     Rate and Rhythm: Normal rate.  Pulmonary:     Effort: Pulmonary effort is normal. No respiratory distress.  Musculoskeletal:        General: Normal range of motion.     Cervical back: Normal range of motion.  Skin:    General: Skin is warm and dry.     Capillary Refill: Capillary refill takes 2 to 3 seconds.  Neurological:     Mental Status: She is alert and oriented to person, place, and time.  Psychiatric:        Mood and Affect: Mood normal.        Behavior: Behavior normal.     Labs and Imaging    Component 9 d ago  High risk HPV Negative  Adequacy Satisfactory but limited for evaluation with partially obscuring  Adequacy organisms; transformation zone component present.  Diagnosis - Atypical squamous cells of undetermined significance (ASC-US) Abnormal   Comment Normal Reference Range HPV - Negative  Resulting Agency [facility]               Component 9 d ago  Neisseria Gonorrhea Negative  Chlamydia Negative  Trichomonas Negative  Bacterial Vaginitis (gardnerella) Positive Abnormal   Comment Normal  Reference Range Bacterial Vaginosis - Negative  Comment Normal Reference Range Trichomonas - Negative  Comment Normal Reference Ranger Chlamydia - Negative  Comment Normal Reference Range Neisseria Gonorrhea - Negative  Resulting Agency [facility]                  Component Ref Range & Units 9 d ago 14 yr ago  RPR Ser Ql Non Reactive Non Reactive NON REAC R  Resulting Agency  [facility]            Component Ref Range & Units 9 d ago 1 yr ago 4 yr ago  HIV Screen 4th Generation wRfx Non Reactive Non Reactive       SURGICAL PATHOLOGY SURGICAL PATHOLOGY CASE: [id number] PATIENT: Brandi Bryant Surgical Pathology Report     Clinical History: postmenopausal vaginal discharge (cm)     FINAL MICROSCOPIC DIAGNOSIS:  A. LABIA MAJORA, LEFT, BIOPSY: - High-grade squamous intraepithelial lesion (VIN2, condyloma acuminata)      Assessment & Plan:  1. Encounter to discuss test results - Reviewed results with patient and patient daughter.   2. ASCUS of cervix with negative high risk HPV - Per ASCCP guidelines, follow up in 3 years.   3. Genital warts - PreLim Results show condyloma acuminata (Genital Warts)  -  Discussed STI transmission and likelihood of entirety being excised with biopsy.  - Follow up in 6-12 months and monitor for other lesions.    4. BV (bacterial vaginosis) - Patient currently taking medication for treatment of BV.   5. Language barrier - Due to language barrier, an interpreter was present during the history-taking and subsequent discussion (and for part of the physical exam) with this patient. - In person spanish interpreter Brandi Bryant used for the duration of this visit.     Brandi Bryant) Rollene Rotunda, MSN, [place] for Brandi Bryant  01/27/23 11:47 AM

## 2023-01-30 ENCOUNTER — Telehealth: Payer: Self-pay

## 2023-01-30 ENCOUNTER — Other Ambulatory Visit: Payer: Self-pay

## 2023-01-30 NOTE — Telephone Encounter (Addendum)
-----   Message from Shelda Pal, DO sent at 01/29/2023 12:03 AM EST ----- Hello,   Please call pt to let her know that she needs to come in to discuss her results and to start her treatment. Please make this visit for as soon as possible.   Thank you,  Shelda Pal, DO [facility] Fellow  Called pt with [facility] and informed pt of BV with tx sent to [facility].   Pt advised that I will refer her to [facility] as she had pap smear without insurance.  Pt verbalized understanding.

## 2023-02-01 ENCOUNTER — Other Ambulatory Visit: Payer: Self-pay

## 2023-02-20 NOTE — Telephone Encounter (Signed)
Received message from Dr. Clement Husbands that pt needs an appt for genital warts (condolyma).  Left message with [place] to return call to office.    Per ASCCP, she needs a pap in 3 years, but with her condyloma, it would be safe to bring her back in 1 year. She does need to come in soon for tx of her condyloma acuminata.   Frances Nickels

## 2023-02-21 NOTE — Telephone Encounter (Signed)
Pt called and notified of appt scheduled on 02/23/23 for condyloma tx.    Brandi Bryant

## 2023-02-22 ENCOUNTER — Other Ambulatory Visit: Payer: Self-pay

## 2023-02-23 ENCOUNTER — Other Ambulatory Visit: Payer: Self-pay

## 2023-02-23 ENCOUNTER — Ambulatory Visit (INDEPENDENT_AMBULATORY_CARE_PROVIDER_SITE_OTHER): Payer: Self-pay | Admitting: Obstetrics and Gynecology

## 2023-02-23 VITALS — BP 135/81 | HR 70 | Wt 156.3 lb

## 2023-02-23 DIAGNOSIS — Z603 Acculturation difficulty: Secondary | ICD-10-CM

## 2023-02-23 DIAGNOSIS — N901 Moderate vulvar dysplasia: Secondary | ICD-10-CM

## 2023-02-23 DIAGNOSIS — Z758 Other problems related to medical facilities and other health care: Secondary | ICD-10-CM

## 2023-02-23 NOTE — Progress Notes (Signed)
NEW GYNECOLOGY PATIENT Patient name: Brandi Bryant MRN [id number]  Date of birth: [birth year] Chief Complaint:   Follow-up and vulvar lesions     History:  Brandi Bryant is a 67 y.o. [id number] being seen today for follow up. States she had lesion removed and here to make sure it is ok. No changes or pain in the area. Denies fever and chills.    Per 01/19/2023 visit "1 cm pedunculated, hyperpigmented plaque on left labia majora. Noted multiple hyperpigmented macules on the bilateral labia majora."  Obstetric History OB History  Gravida Para Term Preterm AB Living  6 6 6     6   SAB IAB Ectopic Multiple Live Births               # Outcome Date GA Lbr Len/2nd Weight Sex Delivery Anes PTL Lv  6 Term           5 Term           4 Term           3 Term           2 Term           1 Term             Past Medical History:  Diagnosis Date   Abdominal pain    Allergy    Arthralgia 08/13/2013   Bell palsy 2006   Bell's palsy    Breast cancer (HCC) 2009   Left Breast Cancer   Cataract    Chest pain 09/30/2013   Cholelithiasis    Chronic sinusitis 05/16/2017   Colon cancer screening 10/27/2015   Complication of anesthesia    " tubal ligation, in [place]"  had weakness, couldnt stand up the next day, was given pills for oxygen for Brain."  1 month after I was having loss of attentiviness, still have to focus  carefully.    Coronary artery disease    CVA (cerebral vascular accident) (HCC) 03/30/2018   Depression    Diabetes mellitus    Type II   Dyspnea    at times- when air conditioner is runnimg, heat also    Encounter for screening colonoscopy 10/30/2015   GERD (gastroesophageal reflux disease)    Hepatic steatosis    History of breast cancer 06/01/2012   HTN (hypertension)    Hx of adenomatous polyp of colon 11/07/2019   Hyperlipidemia    Internal carotid artery stenosis 04/03/2018   Personal history of chemotherapy 2009   Left Breast Cancer   Personal history  of radiation therapy 2009   Left Breast Cancer   Stroke [facility](HCC)    2006, 2015   TIA (transient ischemic attack) 02/20/2014    Past Surgical History:  Procedure Laterality Date   ANKLE ARTHROSCOPY Right 12/14/2016   Procedure: Right Ankle Arthroscopic Debridement;  Surgeon: Nadara MustardMarcus Duda V, MD;  Location: [facility] OR;  Service: Orthopedics;  Laterality: Right;   Arm surgery     Left tendon lengthen   BREAST LUMPECTOMY Left 2009   CHOLECYSTECTOMY N/A 12/09/2015   Procedure: LAPAROSCOPIC CHOLECYSTECTOMY WITH INTRAOPERATIVE CHOLANGIOGRAM;  Surgeon: Gaynelle AduEric Wilson, MD;  [facility];  Service: General;  Laterality: N/A;   LEFT HEART CATHETERIZATION WITH CORONARY ANGIOGRAM N/A 10/30/2013   Procedure: LEFT HEART CATHETERIZATION WITH CORONARY ANGIOGRAM;  Surgeon: Wendall StadePeter C Nishan, MD;  [facility];  Service: Cardiovascular;  Laterality: N/A;   porta cath Right    Removal of Porta cath Right  TUBAL LIGATION      Current Outpatient Medications on File Prior to Visit  Medication Sig Dispense Refill   aspirin 325 MG EC tablet Take 1 tablet (325 mg total) by mouth daily. 30 tablet 6   atorvastatin (LIPITOR) 40 MG tablet Take 1 tablet (40 mg total) by mouth daily. 90 tablet 1   diltiazem (CARDIZEM CD) 180 MG 24 hr capsule Take 1 capsule (180 mg total) by mouth daily. 90 capsule 1   Dulaglutide (TRULICITY) 1.5 MG/0.5ML SOPN Inject 1.5 mg into the skin once a week. 2 mL 6   glipiZIDE (GLUCOTROL) 5 MG tablet Take 0.5 tablets (2.5 mg total) by mouth 2 (two) times daily before a meal. 45 tablet 1   Insulin Pen Needle (TECHLITE PEN NEEDLES) 32G X 4 MM MISC use to inject lantus daily 100 each 0   lisinopril-hydrochlorothiazide (ZESTORETIC) 20-25 MG tablet Take 1 tablet by mouth daily. 90 tablet 3   meclizine (ANTIVERT) 25 MG tablet Take 1 tablet (25 mg total) by mouth 3 (three) times daily as needed for dizziness. 60 tablet 1   ondansetron (ZOFRAN-ODT) 4 MG disintegrating tablet Take 1 tablet (4 mg total) by  mouth every 8 (eight) hours as needed for nausea or vomiting. 12 tablet 0   clotrimazole (LOTRIMIN) 1 % cream Apply 1 Application topically 2 (two) times daily. (Patient not taking: Reported on 01/19/2023) 30 g 0   cyclobenzaprine (FLEXERIL) 10 MG tablet Take 1 tablet (10 mg total) by mouth every 8 (eight) hours as needed for muscle spasms. (Patient not taking: Reported on 01/19/2023) 60 tablet 1   meloxicam (MOBIC) 15 MG tablet Take 1 tablet (15 mg total) by mouth daily. (Patient not taking: Reported on 01/19/2023) 30 tablet 2   pantoprazole (PROTONIX) 40 MG tablet Take 1 tablet (40 mg total) by mouth daily. (Patient not taking: Reported on 01/19/2023) 30 tablet 3   polyethylene glycol powder (GLYCOLAX/MIRALAX) 17 GM/SCOOP powder Take 17 g by mouth daily. (Patient not taking: Reported on 01/19/2023) 3350 g 1   rizatriptan (MAXALT) 5 MG tablet TAKE 1 TABLET (5 MG TOTAL) BY MOUTH AS NEEDED FOR MIGRAINE. MAY REPEAT IN 2 HOURS IF NEEDED (Patient not taking: Reported on 01/19/2023) 10 tablet 2   traZODone (DESYREL) 100 MG tablet TAKE 1 TABLET (100 MG TOTAL) BY MOUTH AT BEDTIME AS NEEDED FOR SLEEP. (Patient taking differently: Take 100 mg by mouth at bedtime as needed for sleep.) 30 tablet 3   [DISCONTINUED] escitalopram (LEXAPRO) 10 MG tablet Take 10 mg by mouth daily.     No current facility-administered medications on file prior to visit.    Allergies  Allergen Reactions   Gadolinium Derivatives Nausea And Vomiting    Code: VOM, Desc: Pt began vomiting 45 sec after MRI contrast injection of Multihance, Onset Date: [id number]     Iodinated Contrast Media Nausea And Vomiting    Social History:  reports that she has never smoked. She has never used smokeless tobacco. She reports that she does not drink alcohol and does not use drugs.  Family History  Problem Relation Age of Onset   Thyroid cancer Sister    Hypertension Mother    Diabetes Mother    Migraines Daughter    Colon cancer Neg Hx     Esophageal cancer Neg Hx    Stomach cancer Neg Hx    Rectal cancer Neg Hx     The following portions of the patient's history were reviewed and updated as appropriate: allergies, current  medications, past family history, past medical history, past social history, past surgical history and problem list.  Review of Systems Pertinent items noted in HPI and remainder of comprehensive ROS otherwise negative.  Physical Exam:  BP 135/81   Pulse 70   Wt 156 lb 4.8 oz (70.9 kg)   BMI 30.53 kg/m  Physical Exam Vitals and nursing note reviewed. Exam conducted with a chaperone present.  Constitutional:      Appearance: Normal appearance.  Cardiovascular:     Rate and Rhythm: Normal rate.  Pulmonary:     Effort: Pulmonary effort is normal.     Breath sounds: Normal breath sounds.  Genitourinary:      Comments: Small, hypopigmented lesions on lower left labia majora No Neurological:     General: No focal deficit present.     Mental Status: She is alert and oriented to person, place, and time.  Psychiatric:        Mood and Affect: Mood normal.        Behavior: Behavior normal.        Thought Content: Thought content normal.        Judgment: Judgment normal.    FINAL MICROSCOPIC DIAGNOSIS:   A. 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 Initial report also notes condyloma acuminata which is typically benign. Discussed clarifying report with pathology but most likely would treat on the spectrum of pre-cancerous lesion with close interval follow up. Will plan for return in about 2 months for vulvar colposcopy and possible biopsy at that time with q6 month follow up thereafter.   2. Language barrier In person spanish interpreter, Eda    Routine preventative health maintenance measures  emphasized. Please refer to After Visit Summary for other counseling recommendations.   Follow-up: No follow-ups on file.      Lorriane Shire, MD Obstetrician & Gynecologist, Faculty Practice [facility] [employer], [facility]

## 2023-02-24 ENCOUNTER — Telehealth: Payer: Self-pay

## 2023-02-24 NOTE — Telephone Encounter (Addendum)
-----   Message from Lorriane Shire, MD sent at 02/23/2023  6:48 PM EDT ----- Regarding: follow up plan Hello,  Please contact patient and notify that I reviewed case with a colleague and recommend follow up in 2 months for a vulvar colposcopy. This is to take a closer look at the vulva and take more biopsies if needed. Thank you,  C Ajewole   Called pt's number, VM not set up. Called daughter and requested her mother call us back.

## 2023-03-01 DIAGNOSIS — N901 Moderate vulvar dysplasia: Secondary | ICD-10-CM | POA: Insufficient documentation

## 2023-03-09 NOTE — Telephone Encounter (Signed)
Pt notified of results and provider recommendation for colpo.  Pt stated that [facility] will call her to schedule an appointment as she is self pay.    Addison Naegeli, RN  03/09/23

## 2023-03-13 ENCOUNTER — Ambulatory Visit: Payer: Self-pay | Admitting: Obstetrics and Gynecology

## 2023-03-20 ENCOUNTER — Other Ambulatory Visit: Payer: Self-pay

## 2023-03-21 ENCOUNTER — Ambulatory Visit (INDEPENDENT_AMBULATORY_CARE_PROVIDER_SITE_OTHER): Payer: Self-pay

## 2023-03-21 ENCOUNTER — Other Ambulatory Visit: Payer: Self-pay

## 2023-03-21 ENCOUNTER — Encounter (HOSPITAL_COMMUNITY): Payer: Self-pay

## 2023-03-21 ENCOUNTER — Ambulatory Visit (HOSPITAL_COMMUNITY)
Admission: EM | Admit: 2023-03-21 | Discharge: 2023-03-21 | Disposition: A | Discharge status: Home / Self Care | Payer: Self-pay | Attending: Family Medicine | Admitting: Family Medicine

## 2023-03-21 DIAGNOSIS — M7732 Calcaneal spur, left foot: Secondary | ICD-10-CM

## 2023-03-21 MED ORDER — DICLOFENAC SODIUM 1 % EX GEL
2.0000 g | Freq: Four times a day (QID) | CUTANEOUS | 0 refills | Status: DC
  Filled 2023-03-21: qty 100, 13d supply, fill #0
  Filled 2023-03-21: qty 100, 12d supply, fill #0

## 2023-03-21 NOTE — Discharge Instructions (Signed)
Su radiografa mostr un espoln en el taln que probablemente contribuya a sus sntomas. Mantenga el pie elevado. Tambin te recomiendo que uses gel de Research scientist (life sciences) rea 4 veces al da segn sea necesario. Puede utilizar Tylenol para Chief Technology Officer irruptivo. En ltima instancia, es posible que deba hacer un seguimiento con el podlogo para considerar intervenciones adicionales, incluida la posible ciruga, si sus sntomas no mejoran. Hay otras cosas que puede hacer sin receta, como colocar almohadillas para los talones y tratar de Cabin crew. Si tiene algn sntoma que empeora o [place], regrese para una reevaluacin.

## 2023-03-21 NOTE — ED Triage Notes (Signed)
Per Trixie Deis #[id number]  Patient c/o left leg pain x 8 months. Patient points to the left heel and calf and states she has swelling to the left cal.  Patient reports that she has had 2 shots for the pain in the sole of her foot, but they onlylast 3 days after receiving them.  Patient has not taken any pain medications today.

## 2023-03-21 NOTE — ED Provider Notes (Signed)
[facility]    CSN: [id number] Arrival date & time: 03/21/23  1353      History   Chief Complaint Chief Complaint  Patient presents with   Leg Pain    HPI Brandi Bryant is a 67 y.o. female.   Patient presents today with an 11-month history of left heel pain.  Patient is Spanish-speaking and video interpreter was utilized for visit.  Reports she has been seen by podiatrist and had injections into this area for treatment of plantars fasciitis which provides only 1 to 2 days of symptom relief until she has recurrence.  She reports minimal pain at rest but increases significantly with attempted ambulation.  It is localized to her heel with radiation throughout her forefoot and up her Achilles into her calf.  She denies any known injury or increase in activity prior to symptom onset.  She has not been taking any over-the-counter medication for symptom management.  Denies any numbness or paresthesias in her foot.  Denies previous surgery involving her foot.  She does have a history of diabetes but reports her blood sugars are adequately controlled.      Past Medical History:  Diagnosis Date   Abdominal pain    Allergy    Arthralgia 08/13/2013   Bell palsy 2006   Bell's palsy    Breast cancer (HCC) 2009   Left Breast Cancer   Cataract    Chest pain 09/30/2013   Cholelithiasis    Chronic sinusitis 05/16/2017   Colon cancer screening 10/27/2015   Complication of anesthesia    " tubal ligation, in [place]"  had weakness, couldnt stand up the next day, was given pills for oxygen for Brain."  1 month after I was having loss of attentiviness, still have to focus  carefully.    Coronary artery disease    CVA (cerebral vascular accident) (HCC) 03/30/2018   Depression    Diabetes mellitus    Type II   Dyspnea    at times- when air conditioner is runnimg, heat also    Encounter for screening colonoscopy 10/30/2015   GERD (gastroesophageal reflux disease)    Hepatic  steatosis    History of breast cancer 06/01/2012   HTN (hypertension)    Hx of adenomatous polyp of colon 11/07/2019   Hyperlipidemia    Internal carotid artery stenosis 04/03/2018   Personal history of chemotherapy 2009   Left Breast Cancer   Personal history of radiation therapy 2009   Left Breast Cancer   Stroke (HCC)    2006, 2015   TIA (transient ischemic attack) 02/20/2014    Patient Active Problem List   Diagnosis Date Noted   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   ASCUS of cervix with negative high risk HPV 01/28/2023   Genital warts 01/28/2023   AKI (acute kidney injury) (HCC) 07/16/2021   Pyelonephritis 07/15/2021   Epigastric pain 06/08/2021   Vitamin D deficiency 05/13/2021   Insomnia 05/13/2021   Other cirrhosis of liver (HCC) 11/14/2019   Kidney lesion, native, left 11/14/2019   Hx of adenomatous polyp of colon 11/07/2019   Left wrist tendinitis 04/17/2019   Primary osteoarthritis of first carpometacarpal joint of left hand 04/17/2019   Carpal tunnel syndrome 11/07/2018   GERD (gastroesophageal reflux disease) 11/27/2017   Diabetic polyneuropathy associated with type 2 diabetes mellitus (HCC) 10/31/2016   Impingement syndrome of right ankle 10/31/2016   Symptomatic cholelithiasis 12/09/2015   Daily headache 02/21/2014   Trigeminal neuralgia 01/22/2014  Weakness 01/22/2014   Bell's palsy 01/21/2014   NAFLD (nonalcoholic fatty liver disease) 16/08/9603   Facial droop 01/15/2014   Stroke (HCC) 01/15/2014   Type 2 diabetes mellitus with diabetic neuropathy (HCC) 12/06/2013   Hyperlipidemia 11/07/2013   CAD (coronary artery disease), native coronary artery 11/07/2013   Chest pain 09/30/2013   History of breast cancer 06/01/2012   HTN (hypertension)    Diabetes mellitus (HCC)     Past Surgical History:  Procedure Laterality Date   ANKLE ARTHROSCOPY Right 12/14/2016   Procedure: Right Ankle Arthroscopic Debridement;  Surgeon: Nadara Mustard, MD;  Location: [facility] OR;  Service: Orthopedics;  Laterality: Right;   Arm surgery     Left tendon lengthen   BREAST LUMPECTOMY Left 2009   CHOLECYSTECTOMY N/A 12/09/2015   Procedure: LAPAROSCOPIC CHOLECYSTECTOMY WITH INTRAOPERATIVE CHOLANGIOGRAM;  Surgeon: Gaynelle Adu, MD;  Location: [facility] OR;  Service: General;  Laterality: N/A;   LEFT HEART CATHETERIZATION WITH CORONARY ANGIOGRAM N/A 10/30/2013   Procedure: LEFT HEART CATHETERIZATION WITH CORONARY ANGIOGRAM;  Surgeon: Wendall Stade, MD;  Location: [facility] CATH LAB;  Service: Cardiovascular;  Laterality: N/A;   porta cath Right    Removal of Porta cath Right    TUBAL LIGATION      OB History     Gravida  6   Para  6   Term  6   Preterm      AB      Living  6      SAB      IAB      Ectopic      Multiple      Live Births               Home Medications    Prior to Admission medications   Medication Sig Start Date End Date Taking? Authorizing Provider  diclofenac Sodium (VOLTAREN) 1 % GEL Apply 2 g topically 4 (four) times daily. 03/21/23  Yes Butler Vegh, Noberto Retort, PA-C  aspirin 325 MG EC tablet Take 1 tablet (325 mg total) by mouth daily. 06/14/18   Anders Simmonds, PA-C  atorvastatin (LIPITOR) 40 MG tablet Take 1 tablet (40 mg total) by mouth daily. 01/11/23   Hoy Register, MD  clotrimazole (LOTRIMIN) 1 % cream Apply 1 Application topically 2 (two) times daily. Patient not taking: Reported on 01/19/2023 01/11/23   Hoy Register, MD  cyclobenzaprine (FLEXERIL) 10 MG tablet Take 1 tablet (10 mg total) by mouth every 8 (eight) hours as needed for muscle spasms. Patient not taking: Reported on 01/19/2023 03/10/22   Hoy Register, MD  diltiazem (CARDIZEM CD) 180 MG 24 hr capsule Take 1 capsule (180 mg total) by mouth daily. 01/11/23   Hoy Register, MD  Dulaglutide (TRULICITY) 1.5 MG/0.5ML SOPN Inject 1.5 mg into the skin once a week. 01/20/23   Hoy Register, MD  glipiZIDE (GLUCOTROL) 5 MG tablet Take 0.5 tablets (2.5 mg  total) by mouth 2 (two) times daily before a meal. 01/11/23 01/11/24  Hoy Register, MD  Insulin Pen Needle (TECHLITE PEN NEEDLES) 32G X 4 MM MISC use to inject lantus daily 04/25/22   Hoy Register, MD  lisinopril-hydrochlorothiazide (ZESTORETIC) 20-25 MG tablet Take 1 tablet by mouth daily. 10/07/22   Hoy Register, MD  meclizine (ANTIVERT) 25 MG tablet Take 1 tablet (25 mg total) by mouth 3 (three) times daily as needed for dizziness. 01/11/23   Hoy Register, MD  ondansetron (ZOFRAN-ODT) 4 MG disintegrating tablet Take 1 tablet (4 mg total) by  mouth every 8 (eight) hours as needed for nausea or vomiting. 01/16/22   Jeannie Fend, PA-C  pantoprazole (PROTONIX) 40 MG tablet Take 1 tablet (40 mg total) by mouth daily. Patient not taking: Reported on 01/19/2023 06/20/22   Hoy Register, MD  polyethylene glycol powder (GLYCOLAX/MIRALAX) 17 GM/SCOOP powder Take 17 g by mouth daily. Patient not taking: Reported on 01/19/2023 03/10/22   Hoy Register, MD  rizatriptan (MAXALT) 5 MG tablet TAKE 1 TABLET (5 MG TOTAL) BY MOUTH AS NEEDED FOR MIGRAINE. MAY REPEAT IN 2 HOURS IF NEEDED Patient not taking: Reported on 01/19/2023 06/20/22   Hoy Register, MD  traZODone (DESYREL) 100 MG tablet TAKE 1 TABLET (100 MG TOTAL) BY MOUTH AT BEDTIME AS NEEDED FOR SLEEP. Patient taking differently: Take 100 mg by mouth at bedtime as needed for sleep. 05/13/21 01/19/23  Mayers, Cari S, PA-C  escitalopram (LEXAPRO) 10 MG tablet Take 10 mg by mouth daily.  01/31/12  [provider]    Family History Family History  Problem Relation Age of Onset   Thyroid cancer Sister    Hypertension Mother    Diabetes Mother    Migraines Daughter    Colon cancer Neg Hx    Esophageal cancer Neg Hx    Stomach cancer Neg Hx    Rectal cancer Neg Hx     Social History Social History   Tobacco Use   Smoking status: Never   Smokeless tobacco: Never  Vaping Use   Vaping Use: Never used  Substance Use Topics   Alcohol  use: No   Drug use: No     Allergies   Gadolinium derivatives and Iodinated contrast media   Review of Systems Review of Systems  Constitutional:  Positive for activity change. Negative for appetite change, fatigue and fever.  Respiratory:  Negative for cough and shortness of breath.   Cardiovascular:  Negative for chest pain and leg swelling.  Musculoskeletal:  Positive for arthralgias and gait problem. Negative for joint swelling and myalgias.  Neurological:  Negative for weakness and numbness.     Physical Exam Triage Vital Signs ED Triage Vitals  Enc Vitals Group     BP 03/21/23 1402 (!) 149/79     Pulse Rate 03/21/23 1402 69     Resp 03/21/23 1402 16     Temp 03/21/23 1402 98.1 F (36.7 C)     Temp Source 03/21/23 1402 Oral     SpO2 03/21/23 1402 95 %     Weight --      Height --      Head Circumference --      Peak Flow --      Pain Score 03/21/23 1408 10     Pain Loc --      Pain Edu? --      Excl. in GC? --    No data found.  Updated Vital Signs BP (!) 149/79 (BP Location: Left Arm)   Pulse 69   Temp 98.1 F (36.7 C) (Oral)   Resp 16   SpO2 95%   Visual Acuity Right Eye Distance:   Left Eye Distance:   Bilateral Distance:    Right Eye Near:   Left Eye Near:    Bilateral Near:     Physical Exam Vitals reviewed.  Constitutional:      General: She is awake. She is not in acute distress.    Appearance: Normal appearance. She is well-developed. She is not ill-appearing.     Comments: Very  pleasant female appears stated age in no acute distress sitting comfortably in exam room  HENT:     Head: Normocephalic and atraumatic.  Cardiovascular:     Rate and Rhythm: Normal rate and regular rhythm.     Pulses:          Posterior tibial pulses are 2+ on the left side.     Heart sounds: Normal heart sounds, S1 normal and S2 normal. No murmur heard.    Comments: Calfs measure 37.5 cm bilaterally.  Negative Homans' sign on left. Pulmonary:     Effort:  Pulmonary effort is normal.     Breath sounds: Normal breath sounds. No wheezing, rhonchi or rales.     Comments: Clear to auscultation Musculoskeletal:     Right lower leg: No edema.     Left lower leg: No edema.     Left foot: Normal capillary refill. Tenderness and bony tenderness present. No swelling or deformity.     Comments: Left foot: Tenderness palpation over the left calcaneus without deformity.  Foot neurovascularly intact.  Psychiatric:        Behavior: Behavior is cooperative.      UC Treatments / Results  Labs (all labs ordered are listed, but only abnormal results are displayed) Labs Reviewed - No data to display  EKG   Radiology DG Os Calcis Left  Result Date: 03/21/2023 CLINICAL DATA:  Left heel pain for 8 months. EXAM: LEFT OS CALCIS - 2+ VIEW COMPARISON:  September 16, 2022. FINDINGS: No fracture or dislocation is noted. Mild posterior calcaneal spurring is noted. IMPRESSION: No acute abnormality seen. Electronically Signed   By: Lupita Raider M.D.   On: 03/21/2023 14:50    Procedures Procedures (including critical care time)  Medications Ordered in UC Medications - No data to display  Initial Impression / Assessment and Plan / UC Course  I have reviewed the triage vital signs and the nursing notes.  Pertinent labs & imaging results that were available during my care of the patient were reviewed by me and considered in my medical decision making (see chart for details).     Patient is well-appearing, afebrile, nontoxic, nontachycardic.  Low suspicion for VTE given given her clinical presentation and pain originates in her heel.  X-ray was obtained given worsening symptoms that showed calcaneal spur without additional osseous abnormality.  We discussed that this could be contributing to her symptoms and she likely has plantar fasciitis with associated Achilles tendinopathy.  Unfortunately, she is limited in the medication she can take given her history of  diabetes as well as cardiovascular disease and so not a candidate for systemic NSAIDs.  Will try topical diclofenac to help manage her symptoms.  Recommend that she use conservative treatment measures including stretches as well as heel support in her shoes to help manage her symptoms.  We discussed that the injection she is receiving is appropriate treatment and ultimately she may need more invasive treatment if her symptoms or not improving.  She is to follow-up with podiatry to discuss potential future interventions.  We discussed that if she has any worsening or changing symptoms she needs to be seen immediately.  Strict return precautions given.  Final Clinical Impressions(s) / UC Diagnoses   Final diagnoses:  Heel spur, left     Discharge Instructions      Su radiografa mostr un espoln en el taln que probablemente contribuya a sus sntomas. Mantenga el pie elevado. Tambin te recomiendo que uses gel de  diclofenaco en el rea 4 veces al da segn sea necesario. Puede utilizar Tylenol para Chief Technology Officer irruptivo. En ltima instancia, es posible que deba hacer un seguimiento con el podlogo para considerar intervenciones adicionales, incluida la posible ciruga, si sus sntomas no mejoran. Hay otras cosas que puede hacer sin receta, como colocar almohadillas para los talones y tratar de [employer]. Si tiene algn sntoma que empeora o [place], regrese para una reevaluacin.     ED Prescriptions     Medication Sig Dispense Auth. Provider   diclofenac Sodium (VOLTAREN) 1 % GEL Apply 2 g topically 4 (four) times daily. 150 g Gila Lauf K, PA-C      PDMP not reviewed this encounter.   Jeani Hawking, PA-C 03/21/23 1521

## 2023-04-11 ENCOUNTER — Other Ambulatory Visit: Payer: Self-pay | Admitting: Physician Assistant

## 2023-04-11 ENCOUNTER — Ambulatory Visit: Payer: Self-pay | Attending: Family Medicine | Admitting: Family Medicine

## 2023-04-11 ENCOUNTER — Encounter: Payer: Self-pay | Admitting: Family Medicine

## 2023-04-11 ENCOUNTER — Other Ambulatory Visit: Payer: Self-pay

## 2023-04-11 VITALS — BP 111/65 | HR 72 | Temp 98.0°F | Ht 60.0 in | Wt 157.8 lb

## 2023-04-11 DIAGNOSIS — K5909 Other constipation: Secondary | ICD-10-CM

## 2023-04-11 DIAGNOSIS — Z794 Long term (current) use of insulin: Secondary | ICD-10-CM

## 2023-04-11 DIAGNOSIS — E1140 Type 2 diabetes mellitus with diabetic neuropathy, unspecified: Secondary | ICD-10-CM

## 2023-04-11 DIAGNOSIS — E1169 Type 2 diabetes mellitus with other specified complication: Secondary | ICD-10-CM

## 2023-04-11 DIAGNOSIS — N901 Moderate vulvar dysplasia: Secondary | ICD-10-CM

## 2023-04-11 LAB — POCT GLYCOSYLATED HEMOGLOBIN (HGB A1C): HbA1c, POC (controlled diabetic range): 7.6 % — AB (ref 0.0–7.0)

## 2023-04-11 MED ORDER — POLYETHYLENE GLYCOL 3350 17 GM/SCOOP PO POWD
17.0000 g | Freq: Every day | ORAL | 1 refills | Status: DC
Start: 2023-04-11 — End: 2024-06-13
  Filled 2023-04-11: qty 510, 30d supply, fill #0

## 2023-04-11 MED ORDER — GLIPIZIDE 5 MG PO TABS
5.0000 mg | ORAL_TABLET | Freq: Two times a day (BID) | ORAL | 1 refills | Status: DC
Start: 2023-04-11 — End: 2023-05-22
  Filled 2023-04-11: qty 90, 45d supply, fill #0
  Filled 2023-04-24: qty 90, 45d supply, fill #1

## 2023-04-11 NOTE — Progress Notes (Signed)
Pain in bottom of left foot.

## 2023-04-11 NOTE — Progress Notes (Signed)
Subjective:  Patient ID: Brandi Bryant, female    DOB: Apr 20, 1956  Age: 67 y.o. MRN: [id number]  CC: Diabetes   HPI Brandi Bryant is a 67 y.o. year old female with a history of GERD, hypertension, hyperlipidemia, breast cancer status post left lumpectomy and radiation, type 2 diabetes mellitus (A1c 7.6) previous CVA, TIA in 03/2018, VIN 2    Interval History:  She received a cortisone injection 3 months ago in her foot and since then has noticed elevated blood sugars with fasting sugars in the 200s and random as high as 400. A1c is 7.6 up from 6.1 previously and she endorses adherence with her medications and has been eating lots of vegetables.  She saw Obgyn in 02/2023 for follow up of a vulvar lesion and underwent biopsy which revealed VIN2  Pathology report revealed: FINAL MICROSCOPIC DIAGNOSIS:   A. LABIA MAJORA, LEFT, BIOPSY:  - High-grade squamous intraepithelial lesion (VIN2, condyloma acuminata)   Per OB/GYN notes she needed to follow up in 2 months for colposcopy and biopsy and subsequently every 6 months.  She is constipated and moves bowels every other day.  Improving juice with no much relief. Past Medical History:  Diagnosis Date   Abdominal pain    Allergy    Arthralgia 08/13/2013   Bell palsy 2006   Bell's palsy    Breast cancer (HCC) 2009   Left Breast Cancer   Cataract    Chest pain 09/30/2013   Cholelithiasis    Chronic sinusitis 05/16/2017   Colon cancer screening 10/27/2015   Complication of anesthesia    " tubal ligation, in [place]"  had weakness, couldnt stand up the next day, was given pills for oxygen for Brain."  1 month after I was having loss of attentiviness, still have to focus  carefully.    Coronary artery disease    CVA (cerebral vascular accident) (HCC) 03/30/2018   Depression    Diabetes mellitus    Type II   Dyspnea    at times- when air conditioner is runnimg, heat also    Encounter for screening colonoscopy  10/30/2015   GERD (gastroesophageal reflux disease)    Hepatic steatosis    History of breast cancer 06/01/2012   HTN (hypertension)    Hx of adenomatous polyp of colon 11/07/2019   Hyperlipidemia    Internal carotid artery stenosis 04/03/2018   Personal history of chemotherapy 2009   Left Breast Cancer   Personal history of radiation therapy 2009   Left Breast Cancer   Stroke [facility])    2006, 2015   TIA (transient ischemic attack) 02/20/2014    Past Surgical History:  Procedure Laterality Date   ANKLE ARTHROSCOPY Right 12/14/2016   Procedure: Right Ankle Arthroscopic Debridement;  Surgeon: Nadara Mustard, MD;  Location: [facility] OR;  Service: Orthopedics;  Laterality: Right;   Arm surgery     Left tendon lengthen   BREAST LUMPECTOMY Left 2009   CHOLECYSTECTOMY N/A 12/09/2015   Procedure: LAPAROSCOPIC CHOLECYSTECTOMY WITH INTRAOPERATIVE CHOLANGIOGRAM;  Surgeon: Gaynelle Adu, MD;  Location: [facility] OR;  Service: General;  Laterality: N/A;   LEFT HEART CATHETERIZATION WITH CORONARY ANGIOGRAM N/A 10/30/2013   Procedure: LEFT HEART CATHETERIZATION WITH CORONARY ANGIOGRAM;  Surgeon: Wendall Stade, MD;  Location: [facility] CATH LAB;  Service: Cardiovascular;  Laterality: N/A;   porta cath Right    Removal of Porta cath Right    TUBAL LIGATION      Family History  Problem Relation Age of Onset  Thyroid cancer Sister    Hypertension Mother    Diabetes Mother    Migraines Daughter    Colon cancer Neg Hx    Esophageal cancer Neg Hx    Stomach cancer Neg Hx    Rectal cancer Neg Hx     Social History   Socioeconomic History   Marital status: Single    Spouse name: Not on file   Number of children: 6   Years of education: Not on file   Highest education level: High school graduate  Occupational History   Not on file  Tobacco Use   Smoking status: Never   Smokeless tobacco: Never  Vaping Use   Vaping Use: Never used  Substance and Sexual Activity   Alcohol use: No   Drug use: No   Sexual  activity: Not Currently    Birth control/protection: Surgical  Other Topics Concern   Not on file  Social History Narrative   Single with 6 children    lives with her daughter   Right handed   Never smoker no drug use no alcohol   Social Determinants of Health   Financial Resource Strain: Not on file  Food Insecurity: Not on file  Transportation Needs: No Transportation Needs (08/06/2019)   PRAPARE - Administrator, Civil Service (Medical): No    Lack of Transportation (Non-Medical): No  Physical Activity: Insufficiently Active (01/25/2018)   Exercise Vital Sign    Days of Exercise per Week: 2 days    Minutes of Exercise per Session: 20 min  Stress: Stress Concern Present (01/25/2018)   [employer] of Occupational Health - Occupational Stress Questionnaire    Feeling of Stress : Very much  Social Connections: Moderately Isolated (01/25/2018)   Social Connection and Isolation Panel [NHANES]    Frequency of Communication with Friends and Family: More than three times a week    Frequency of Social Gatherings with Friends and Family: More than three times a week    Attends Religious Services: Never    Database administrator or Organizations: No    Attends Banker Meetings: Never    Marital Status: Divorced    Allergies  Allergen Reactions   Gadolinium Derivatives Nausea And Vomiting    Code: VOM, Desc: Pt began vomiting 45 sec after MRI contrast injection of Multihance, Onset Date: [id number]     Iodinated Contrast Media Nausea And Vomiting    Outpatient Medications Prior to Visit  Medication Sig Dispense Refill   aspirin 325 MG EC tablet Take 1 tablet (325 mg total) by mouth daily. 30 tablet 6   atorvastatin (LIPITOR) 40 MG tablet Take 1 tablet (40 mg total) by mouth daily. 90 tablet 1   clotrimazole (LOTRIMIN) 1 % cream Apply 1 Application topically 2 (two) times daily. 30 g 0   cyclobenzaprine (FLEXERIL) 10 MG tablet Take 1 tablet (10 mg total) by  mouth every 8 (eight) hours as needed for muscle spasms. 60 tablet 1   diclofenac Sodium (VOLTAREN) 1 % GEL Apply 2 g topically 4 (four) times daily. 150 g 0   diltiazem (CARDIZEM CD) 180 MG 24 hr capsule Take 1 capsule (180 mg total) by mouth daily. 90 capsule 1   Dulaglutide (TRULICITY) 1.5 MG/0.5ML SOPN Inject 1.5 mg into the skin once a week. 2 mL 6   Insulin Pen Needle (TECHLITE PEN NEEDLES) 32G X 4 MM MISC use to inject lantus daily 100 each 0   lisinopril-hydrochlorothiazide (ZESTORETIC) 20-25 MG  tablet Take 1 tablet by mouth daily. 90 tablet 3   meclizine (ANTIVERT) 25 MG tablet Take 1 tablet (25 mg total) by mouth 3 (three) times daily as needed for dizziness. 60 tablet 1   ondansetron (ZOFRAN-ODT) 4 MG disintegrating tablet Take 1 tablet (4 mg total) by mouth every 8 (eight) hours as needed for nausea or vomiting. 12 tablet 0   pantoprazole (PROTONIX) 40 MG tablet Take 1 tablet (40 mg total) by mouth daily. 30 tablet 3   rizatriptan (MAXALT) 5 MG tablet TAKE 1 TABLET (5 MG TOTAL) BY MOUTH AS NEEDED FOR MIGRAINE. MAY REPEAT IN 2 HOURS IF NEEDED 10 tablet 2   glipiZIDE (GLUCOTROL) 5 MG tablet Take 0.5 tablets (2.5 mg total) by mouth 2 (two) times daily before a meal. 45 tablet 1   polyethylene glycol powder (GLYCOLAX/MIRALAX) 17 GM/SCOOP powder Take 17 g by mouth daily. 3350 g 1   traZODone (DESYREL) 100 MG tablet TAKE 1 TABLET (100 MG TOTAL) BY MOUTH AT BEDTIME AS NEEDED FOR SLEEP. (Patient taking differently: Take 100 mg by mouth at bedtime as needed for sleep.) 30 tablet 3   No facility-administered medications prior to visit.     ROS Review of Systems  Constitutional:  Negative for activity change and appetite change.  HENT:  Negative for sinus pressure and sore throat.   Respiratory:  Negative for chest tightness, shortness of breath and wheezing.   Cardiovascular:  Negative for chest pain and palpitations.  Gastrointestinal:  Positive for constipation. Negative for abdominal  distention and abdominal pain.  Genitourinary: Negative.   Musculoskeletal: Negative.   Psychiatric/Behavioral:  Negative for behavioral problems and dysphoric mood.     Objective:  BP 111/65   Pulse 72   Temp 98 F (36.7 C) (Oral)   Ht 5' (1.524 m)   Wt 157 lb 12.8 oz (71.6 kg)   SpO2 97%   BMI 30.82 kg/m      04/11/2023    1:56 PM 03/21/2023    2:02 PM 02/23/2023    9:39 AM  BP/Weight  Systolic BP 111 149 135  Diastolic BP 65 79 81  Wt. (Lbs) 157.8  156.3  BMI 30.82 kg/m2  30.53 kg/m2      Physical Exam Constitutional:      Appearance: She is well-developed.  Cardiovascular:     Rate and Rhythm: Normal rate.     Heart sounds: Normal heart sounds. No murmur heard. Pulmonary:     Effort: Pulmonary effort is normal.     Breath sounds: Normal breath sounds. No wheezing or rales.  Chest:     Chest wall: No tenderness.  Abdominal:     General: Bowel sounds are normal. There is no distension.     Palpations: Abdomen is soft. There is no mass.     Tenderness: There is no abdominal tenderness.  Musculoskeletal:        General: Normal range of motion.     Right lower leg: No edema.     Left lower leg: No edema.  Neurological:     Mental Status: She is alert and oriented to person, place, and time.  Psychiatric:        Mood and Affect: Mood normal.        Latest Ref Rng & Units 01/11/2023    2:33 PM 10/07/2022   11:50 AM 03/07/2022   10:42 PM  CMP  Glucose 70 - 99 mg/dL 161  096  045   BUN 8 - 27  mg/dL 16  13  12    Creatinine 0.57 - 1.00 mg/dL 1.61  0.96  0.45   Sodium 134 - 144 mmol/L 144  140  137   Potassium 3.5 - 5.2 mmol/L 4.2  4.5  4.0   Chloride 96 - 106 mmol/L 104  103  108   CO2 20 - 29 mmol/L 24  23  22    Calcium 8.7 - 10.3 mg/dL 9.8  9.7  8.9   Total Protein 6.0 - 8.5 g/dL 7.2  7.3    Total Bilirubin 0.0 - 1.2 mg/dL 0.8  0.7    Alkaline Phos 44 - 121 IU/L 96  112    AST 0 - 40 IU/L 51  49    ALT 0 - 32 IU/L 37  35      Lipid Panel      Component Value Date/Time   CHOL 126 02/03/2022 1059   TRIG 92 02/03/2022 1059   HDL 57 02/03/2022 1059   CHOLHDL 2.2 02/03/2022 1059   CHOLHDL 2.6 03/31/2018 0348   VLDL 33 03/31/2018 0348   LDLCALC 52 02/03/2022 1059    CBC    Component Value Date/Time   WBC 6.9 03/07/2022 2242   RBC 4.44 03/07/2022 2242   HGB 13.3 03/07/2022 2242   HGB 13.4 09/30/2013 1033   HCT 39.7 03/07/2022 2242   HCT 39.1 09/30/2013 1033   PLT 153 03/07/2022 2242   PLT 150 09/30/2013 1033   MCV 89.4 03/07/2022 2242   MCV 88.5 09/30/2013 1033   MCH 30.0 03/07/2022 2242   MCHC 33.5 03/07/2022 2242   RDW 12.3 03/07/2022 2242   RDW 12.8 09/30/2013 1033   LYMPHSABS 2.2 03/07/2022 2242   LYMPHSABS 2.1 09/30/2013 1033   MONOABS 0.7 03/07/2022 2242   MONOABS 0.4 09/30/2013 1033   EOSABS 0.5 03/07/2022 2242   EOSABS 0.2 09/30/2013 1033   BASOSABS 0.0 03/07/2022 2242   BASOSABS 0.0 09/30/2013 1033    Lab Results  Component Value Date   HGBA1C 7.6 (A) 04/11/2023    Assessment & Plan:  1. Type 2 diabetes mellitus with other specified complication, with long-term current use of insulin (HCC) Suboptimally controlled with A1c of 7.6 up from 6.1 previously Recent cortisone shot contributing Increased dose of glipizide from 2.5 mg twice daily to 5 mg twice daily and advised that if she develops hypoglycemia she can revert back to 2.5 mg twice daily Counseled on Diabetic diet, my plate method, 409 minutes of moderate intensity exercise/week Blood sugar logs with fasting goals of 80-120 mg/dl, random of less than 811 and in the event of sugars less than 60 mg/dl or greater than 914 mg/dl encouraged to notify the clinic. Advised on the need for annual eye exams, annual foot exams, Pneumonia vaccine. - POCT glycosylated hemoglobin (Hb A1C)  2. VIN II (vulvar intraepithelial neoplasia II) She will be needing a colposcopy and biopsy in 04/2023 per GYN notes We have provided her with the [facility] an appointment  3. Type 2 diabetes mellitus with diabetic neuropathy, with long-term current use of insulin (HCC) See #1 above - glipiZIDE (GLUCOTROL) 5 MG tablet; Take 1 tablet (5 mg total) by mouth 2 (two) times daily before a meal.  Dispense: 90 tablet; Refill: 1  4. Other constipation Uncontrolled Counseled on increasing fiber intake, fruits and vegetable, limit intake of foods like cheese, white bread, white rice  - polyethylene glycol powder (GLYCOLAX/MIRALAX) 17 GM/SCOOP powder; Take 17  g by mouth daily.  Dispense: 3350 g; Refill: 1      Meds ordered this encounter  Medications   glipiZIDE (GLUCOTROL) 5 MG tablet    Sig: Take 1 tablet (5 mg total) by mouth 2 (two) times daily before a meal.    Dispense:  90 tablet    Refill:  1    Dose increase   polyethylene glycol powder (GLYCOLAX/MIRALAX) 17 GM/SCOOP powder    Sig: Mix 17 grams (1 capful) in 4-8 ounces of water/juice and take once daily.    Dispense:  3350 g    Refill:  1    Follow-up: Return in about 6 months (around 10/12/2023).       Hoy Register, MD, FAAFP. [facility] [place], [place] [phone]   04/11/2023, 3:15 PM

## 2023-04-11 NOTE — Patient Instructions (Signed)

## 2023-04-13 ENCOUNTER — Telehealth: Payer: Self-pay | Admitting: *Deleted

## 2023-04-13 NOTE — Telephone Encounter (Signed)
I called patient with Interpreter [name] and she said her PCP said we are supposed to be calling her with appointment for follow up. Per chart review from 02/23/23 appt she needs followup in 2 months for vulvar biopsy I explained I will send a message to registrar to schedule and contact her with appt. She voices understanding. [name]

## 2023-04-13 NOTE — Telephone Encounter (Signed)
-----   Message from Gerome Apley, RN sent at 04/13/2023  1:53 PM EDT -----  ----- Message ----- From: Hamilton Capri, RN Sent: 04/12/2023   3:03 PM EDT To: Gerome Apley, RN  Brandi Bryant,  This patient called our office and left her call back number in Spanish. It looks like she is a patient at [facility] She did not leave a full message.  Ariel, Charity fundraiser

## 2023-04-18 ENCOUNTER — Telehealth: Payer: Self-pay | Admitting: Family Medicine

## 2023-04-18 NOTE — Telephone Encounter (Signed)
Attempted to reach patient about scheduled appointment. Was not able to leave a message, her mailbox was not setup.

## 2023-04-18 NOTE — Telephone Encounter (Signed)
-----   Message from Gerome Apley, RN sent at 04/13/2023  1:58 PM EDT ----- Regarding: needs 2 month appt from Dr. Briscoe Deutscher from 02/23/23 appt for vulvar bx Patient called to ask about it, I told her you would call her back to schedule Is due around 6/4 [facility]

## 2023-04-19 ENCOUNTER — Other Ambulatory Visit: Payer: Self-pay

## 2023-04-24 ENCOUNTER — Other Ambulatory Visit: Payer: Self-pay

## 2023-04-27 ENCOUNTER — Ambulatory Visit: Payer: Self-pay | Admitting: Obstetrics and Gynecology

## 2023-05-02 ENCOUNTER — Other Ambulatory Visit: Payer: Self-pay

## 2023-05-02 ENCOUNTER — Encounter: Payer: Self-pay | Admitting: Obstetrics and Gynecology

## 2023-05-02 ENCOUNTER — Ambulatory Visit (INDEPENDENT_AMBULATORY_CARE_PROVIDER_SITE_OTHER): Payer: Self-pay | Admitting: Obstetrics and Gynecology

## 2023-05-02 VITALS — BP 134/70 | HR 75 | Wt 156.3 lb

## 2023-05-02 DIAGNOSIS — N901 Moderate vulvar dysplasia: Secondary | ICD-10-CM

## 2023-05-06 NOTE — Progress Notes (Signed)
    GYNECOLOGY OFFICE COLPOSCOPY PROCEDURE NOTE  67 y.o. [id number] here for colposcopy for VIN 2.   Patient gave informed written consent, time out was performed.  Placed in lithotomy position.    Vulvar colposcopy. Acetic acid applied to the vulva for 5 minutes. No acetowhite changes noted. Photo on file  Chaperone was present during entire procedure.  Patient was given post procedure instructions.  Plan for q6 month colposcopies. Routine preventative health maintenance measures emphasized.   Lorriane Shire, MD, FACOG Minimally Invasive Gynecologic Surgery  Obstetrics and Gynecology, [facility] for [facility], [facility] 05/06/2023

## 2023-05-22 ENCOUNTER — Other Ambulatory Visit: Payer: Self-pay

## 2023-05-22 ENCOUNTER — Other Ambulatory Visit: Payer: Self-pay | Admitting: Family Medicine

## 2023-05-22 DIAGNOSIS — E1140 Type 2 diabetes mellitus with diabetic neuropathy, unspecified: Secondary | ICD-10-CM

## 2023-05-22 DIAGNOSIS — E1169 Type 2 diabetes mellitus with other specified complication: Secondary | ICD-10-CM

## 2023-05-22 MED ORDER — GLIPIZIDE 5 MG PO TABS
5.0000 mg | ORAL_TABLET | Freq: Two times a day (BID) | ORAL | 1 refills | Status: DC
Start: 2023-05-22 — End: 2024-04-24
  Filled 2023-05-22 – 2023-06-20 (×2): qty 180, 90d supply, fill #0
  Filled 2023-10-24: qty 180, 90d supply, fill #1

## 2023-05-29 ENCOUNTER — Other Ambulatory Visit: Payer: Self-pay

## 2023-06-20 ENCOUNTER — Other Ambulatory Visit: Payer: Self-pay

## 2023-07-10 ENCOUNTER — Other Ambulatory Visit: Payer: Self-pay

## 2023-07-10 MED ORDER — CIPROFLOXACIN HCL 0.3 % OP SOLN
1.0000 [drp] | Freq: Four times a day (QID) | OPHTHALMIC | 0 refills | Status: DC
  Filled 2023-07-10: qty 5, 25d supply, fill #0

## 2023-07-10 MED ORDER — KETOROLAC TROMETHAMINE 0.5 % OP SOLN
1.0000 [drp] | Freq: Four times a day (QID) | OPHTHALMIC | 0 refills | Status: DC
  Filled 2023-07-10 – 2023-07-17 (×2): qty 5, 25d supply, fill #0

## 2023-07-17 ENCOUNTER — Other Ambulatory Visit: Payer: Self-pay

## 2023-07-17 ENCOUNTER — Other Ambulatory Visit: Payer: Self-pay | Admitting: Family Medicine

## 2023-07-17 DIAGNOSIS — Z794 Long term (current) use of insulin: Secondary | ICD-10-CM

## 2023-07-17 DIAGNOSIS — I10 Essential (primary) hypertension: Secondary | ICD-10-CM

## 2023-07-17 MED ORDER — ATORVASTATIN CALCIUM 40 MG PO TABS
40.0000 mg | ORAL_TABLET | Freq: Every day | ORAL | 1 refills | Status: DC
Start: 2023-07-17 — End: 2024-06-13
  Filled 2023-07-17 – 2023-08-08 (×2): qty 90, 90d supply, fill #0
  Filled 2024-01-26: qty 90, 90d supply, fill #1

## 2023-07-17 MED ORDER — DILTIAZEM HCL ER COATED BEADS 180 MG PO CP24
180.0000 mg | ORAL_CAPSULE | Freq: Every day | ORAL | 1 refills | Status: DC
Start: 2023-07-17 — End: 2024-01-26
  Filled 2023-07-17 – 2023-08-08 (×2): qty 90, 90d supply, fill #0
  Filled 2023-11-08: qty 90, 90d supply, fill #1

## 2023-07-26 ENCOUNTER — Other Ambulatory Visit: Payer: Self-pay

## 2023-08-07 ENCOUNTER — Other Ambulatory Visit: Payer: Self-pay | Admitting: Family Medicine

## 2023-08-07 DIAGNOSIS — I10 Essential (primary) hypertension: Secondary | ICD-10-CM

## 2023-08-07 NOTE — Telephone Encounter (Signed)
Medication Refill - Medication: diltiazem (CARDIZEM CD) 180 MG 24 hr capsule [[id number]  Has the patient contacted their pharmacy? [facility]  [address], [address], [place] [address]  Phone:  [phone]  Fax:  [phone]  DEA #:  [id number]      Has the patient been seen for an appointment in the last year OR does the patient have an upcoming appointment? Yes.    Agent: Please be advised that RX refills may take up to 3 business days. We ask that you follow-up with your pharmacy.

## 2023-08-08 ENCOUNTER — Other Ambulatory Visit: Payer: Self-pay

## 2023-08-08 NOTE — Telephone Encounter (Signed)
Request too soon refilled 07/17/23, #90, 1RF Requested Prescriptions  Pending Prescriptions Disp Refills   diltiazem (CARDIZEM CD) 180 MG 24 hr capsule 90 capsule 1    Sig: Take 1 capsule (180 mg total) by mouth daily.     Cardiovascular: Calcium Channel Blockers 3 Failed - 08/07/2023  3:53 PM      Failed - ALT in normal range and within 360 days    ALT  Date Value Ref Range Status  01/11/2023 37 (H) 0 - 32 IU/L Final  09/30/2013 45 0 - 55 U/L Final         Failed - AST in normal range and within 360 days    AST  Date Value Ref Range Status  01/11/2023 51 (H) 0 - 40 IU/L Final  09/30/2013 50 (H) 5 - 34 U/L Final         Passed - Cr in normal range and within 360 days    Creatinine  Date Value Ref Range Status  09/30/2013 0.8 0.6 - 1.1 mg/dL Final   Creat  Date Value Ref Range Status  12/13/2016 0.68 0.50 - 1.05 mg/dL Final    Comment:      For patients > or = 66 years of age: The upper reference limit for Creatinine is approximately 13% higher for people identified as African-American.      Creatinine, Ser  Date Value Ref Range Status  01/11/2023 0.84 0.57 - 1.00 mg/dL Final   Creatinine, Urine  Date Value Ref Range Status  12/13/2016 128 20 - 320 mg/dL Final         Passed - Last BP in normal range    BP Readings from Last 1 Encounters:  05/02/23 134/70         Passed - Last Heart Rate in normal range    Pulse Readings from Last 1 Encounters:  05/02/23 75         Passed - Valid encounter within last 6 months    Recent Outpatient Visits           3 months ago Type 2 diabetes mellitus with other specified complication, with long-term current use of insulin (HCC)   [facility] [place], [place], MD   6 months ago Type 2 diabetes mellitus with other specified complication, with long-term current use of insulin (HCC)   [facility] [place], [place], MD   10 months ago Type 2 diabetes mellitus  with other specified complication, with long-term current use of insulin (HCC)   [facility] [place], [place], MD   1 year ago Type 2 diabetes mellitus with other specified complication, with long-term current use of insulin (HCC)   [facility] [place], [place], MD   1 year ago Type 2 diabetes mellitus with other specified complication, with long-term current use of insulin [facility])    [facility] Hoy Register, MD       Future Appointments             In 2 months Hoy Register, MD [facility]

## 2023-08-10 ENCOUNTER — Other Ambulatory Visit: Payer: Self-pay

## 2023-08-10 MED ORDER — PREDNISOLONE ACETATE 1 % OP SUSP
1.0000 [drp] | OPHTHALMIC | 1 refills | Status: DC
  Filled 2023-08-10: qty 5, 12d supply, fill #0

## 2023-08-21 ENCOUNTER — Other Ambulatory Visit: Payer: Self-pay | Admitting: Family Medicine

## 2023-08-21 ENCOUNTER — Other Ambulatory Visit: Payer: Self-pay | Admitting: Pharmacist

## 2023-08-21 ENCOUNTER — Other Ambulatory Visit: Payer: Self-pay

## 2023-08-21 DIAGNOSIS — E1169 Type 2 diabetes mellitus with other specified complication: Secondary | ICD-10-CM

## 2023-08-21 MED ORDER — TRULICITY 1.5 MG/0.5ML ~~LOC~~ SOAJ
1.5000 mg | SUBCUTANEOUS | 0 refills | Status: DC
Start: 2023-08-21 — End: 2023-10-16
  Filled 2023-08-21: qty 2, 28d supply, fill #0
  Filled 2023-09-13: qty 2, 28d supply, fill #1
  Filled 2023-10-09: qty 2, 28d supply, fill #2

## 2023-08-22 ENCOUNTER — Other Ambulatory Visit: Payer: Self-pay

## 2023-09-13 ENCOUNTER — Other Ambulatory Visit: Payer: Self-pay

## 2023-09-13 ENCOUNTER — Other Ambulatory Visit: Payer: Self-pay | Admitting: Family Medicine

## 2023-09-13 DIAGNOSIS — G4700 Insomnia, unspecified: Secondary | ICD-10-CM

## 2023-09-15 ENCOUNTER — Other Ambulatory Visit: Payer: Self-pay

## 2023-09-15 MED ORDER — TRAZODONE HCL 100 MG PO TABS
100.0000 mg | ORAL_TABLET | Freq: Every evening | ORAL | 0 refills | Status: DC | PRN
Start: 2023-09-15 — End: 2024-02-13
  Filled 2023-09-15 – 2023-10-24 (×2): qty 30, 30d supply, fill #0

## 2023-09-15 NOTE — Telephone Encounter (Signed)
Requested medications are due for refill today.  yes  Requested medications are on the active medications list.  yes  Last refill. 05/13/2021 #30 3 rf  Future visit scheduled.   yes  Notes to clinic.  Rx written to expired 01/19/2023  -rx is expired.    Requested Prescriptions  Pending Prescriptions Disp Refills   traZODone (DESYREL) 100 MG tablet 30 tablet 3    Sig: TAKE 1 TABLET (100 MG TOTAL) BY MOUTH AT BEDTIME AS NEEDED FOR SLEEP.     There is no refill protocol information for this order

## 2023-09-26 ENCOUNTER — Other Ambulatory Visit: Payer: Self-pay

## 2023-10-06 ENCOUNTER — Other Ambulatory Visit: Payer: Self-pay

## 2023-10-06 MED ORDER — CIPROFLOXACIN HCL 0.3 % OP SOLN
OPHTHALMIC | 0 refills | Status: DC
  Filled 2023-10-06: qty 5, 25d supply, fill #0

## 2023-10-06 MED ORDER — KETOROLAC TROMETHAMINE 0.5 % OP SOLN
OPHTHALMIC | 0 refills | Status: DC
  Filled 2023-10-06: qty 5, 25d supply, fill #0

## 2023-10-06 MED ORDER — PREDNISOLONE ACETATE 1 % OP SUSP
OPHTHALMIC | 1 refills | Status: DC
  Filled 2023-10-06: qty 5, 8d supply, fill #0
  Filled 2024-01-08: qty 5, 8d supply, fill #1

## 2023-10-09 ENCOUNTER — Other Ambulatory Visit: Payer: Self-pay

## 2023-10-09 ENCOUNTER — Other Ambulatory Visit: Payer: Self-pay | Admitting: Family Medicine

## 2023-10-09 DIAGNOSIS — I10 Essential (primary) hypertension: Secondary | ICD-10-CM

## 2023-10-10 ENCOUNTER — Other Ambulatory Visit: Payer: Self-pay

## 2023-10-10 MED ORDER — LISINOPRIL-HYDROCHLOROTHIAZIDE 20-25 MG PO TABS
1.0000 | ORAL_TABLET | Freq: Every day | ORAL | 3 refills | Status: DC
Start: 2023-10-10 — End: 2024-06-13
  Filled 2023-10-10: qty 90, 90d supply, fill #0
  Filled 2024-01-26: qty 90, 90d supply, fill #1
  Filled 2024-04-24: qty 90, 90d supply, fill #2

## 2023-10-10 NOTE — Telephone Encounter (Signed)
Requested Prescriptions  Pending Prescriptions Disp Refills   lisinopril-hydrochlorothiazide (ZESTORETIC) 20-25 MG tablet 90 tablet 3    Sig: Take 1 tablet by mouth daily.     Cardiovascular:  ACEI + Diuretic Combos Failed - 10/09/2023 10:08 AM      Failed - Na in normal range and within 180 days    Sodium  Date Value Ref Range Status  01/11/2023 144 134 - 144 mmol/L Final  09/30/2013 139 136 - 145 mEq/L Final         Failed - K in normal range and within 180 days    Potassium  Date Value Ref Range Status  01/11/2023 4.2 3.5 - 5.2 mmol/L Final  09/30/2013 4.2 3.5 - 5.1 mEq/L Final         Failed - Cr in normal range and within 180 days    Creatinine  Date Value Ref Range Status  09/30/2013 0.8 0.6 - 1.1 mg/dL Final   Creat  Date Value Ref Range Status  12/13/2016 0.68 0.50 - 1.05 mg/dL Final    Comment:      For patients > or = 66 years of age: The upper reference limit for Creatinine is approximately 13% higher for people identified as African-American.      Creatinine, Ser  Date Value Ref Range Status  01/11/2023 0.84 0.57 - 1.00 mg/dL Final   Creatinine, Urine  Date Value Ref Range Status  12/13/2016 128 20 - 320 mg/dL Final         Failed - eGFR is 30 or above and within 180 days    GFR, Est African American  Date Value Ref Range Status  12/13/2016 >89 >=60 mL/min Final   GFR calc Af Amer  Date Value Ref Range Status  12/02/2020 94 >59 mL/min/1.73 Final    Comment:    **In accordance with recommendations from the NKF-ASN Task force,**   [facility] is in the process of updating its eGFR calculation to the   2021 CKD-EPI creatinine equation that estimates kidney function   without a race variable.    GFR, Est Non African American  Date Value Ref Range Status  12/13/2016 >89 >=60 mL/min Final   GFR, Estimated  Date Value Ref Range Status  03/07/2022 >60 >60 mL/min Final    Comment:    (NOTE) Calculated using the CKD-EPI Creatinine Equation (2021)     GFR  Date Value Ref Range Status  10/29/2019 84.64 >60.00 mL/min Final   eGFR  Date Value Ref Range Status  01/11/2023 77 >59 mL/min/1.73 Final         Failed - Valid encounter within last 6 months    Recent Outpatient Visits           6 months ago Type 2 diabetes mellitus with other specified complication, with long-term current use of insulin (HCC)   [facility] [place]. [facility] Hoy Register, MD   9 months ago Type 2 diabetes mellitus with other specified complication, with long-term current use of insulin (HCC)   [facility] [place]. [facility] Hoy Register, MD   1 year ago Type 2 diabetes mellitus with other specified complication, with long-term current use of insulin (HCC)   [facility] [place]. [facility] Hoy Register, MD   1 year ago Type 2 diabetes mellitus with other specified complication, with long-term current  use of insulin (HCC)   [facility] [place]. [facility] Hoy Register, MD   1 year ago Type 2 diabetes mellitus with other specified complication, with long-term current use of insulin (HCC)   [facility] [place]. [facility] Hoy Register, MD       Future Appointments             In 6 days Hoy Register, MD [facility] [place] - A Dept Of [place]a Brideg[place]. [facility]            Passed - Patient is not pregnant      Passed - Last BP in normal range    BP Readings from Last 1 Encounters:  05/02/23 134/70

## 2023-10-16 ENCOUNTER — Ambulatory Visit: Payer: Self-pay | Attending: Family Medicine | Admitting: Family Medicine

## 2023-10-16 ENCOUNTER — Encounter: Payer: Self-pay | Admitting: Family Medicine

## 2023-10-16 ENCOUNTER — Other Ambulatory Visit: Payer: Self-pay

## 2023-10-16 VITALS — BP 124/75 | HR 66 | Ht 60.0 in

## 2023-10-16 DIAGNOSIS — Z7984 Long term (current) use of oral hypoglycemic drugs: Secondary | ICD-10-CM

## 2023-10-16 DIAGNOSIS — Z7985 Long-term (current) use of injectable non-insulin antidiabetic drugs: Secondary | ICD-10-CM

## 2023-10-16 DIAGNOSIS — I10 Essential (primary) hypertension: Secondary | ICD-10-CM

## 2023-10-16 DIAGNOSIS — Z23 Encounter for immunization: Secondary | ICD-10-CM

## 2023-10-16 DIAGNOSIS — E1169 Type 2 diabetes mellitus with other specified complication: Secondary | ICD-10-CM

## 2023-10-16 DIAGNOSIS — F32A Depression, unspecified: Secondary | ICD-10-CM

## 2023-10-16 DIAGNOSIS — K5909 Other constipation: Secondary | ICD-10-CM

## 2023-10-16 DIAGNOSIS — Z794 Long term (current) use of insulin: Secondary | ICD-10-CM

## 2023-10-16 DIAGNOSIS — K219 Gastro-esophageal reflux disease without esophagitis: Secondary | ICD-10-CM

## 2023-10-16 LAB — POCT GLYCOSYLATED HEMOGLOBIN (HGB A1C): HbA1c, POC (controlled diabetic range): 6.7 % (ref 0.0–7.0)

## 2023-10-16 MED ORDER — ESCITALOPRAM OXALATE 10 MG PO TABS
10.0000 mg | ORAL_TABLET | Freq: Every day | ORAL | 1 refills | Status: DC
  Filled 2023-10-16: qty 90, 90d supply, fill #0

## 2023-10-16 MED ORDER — PANTOPRAZOLE SODIUM 40 MG PO TBEC
40.0000 mg | DELAYED_RELEASE_TABLET | Freq: Every day | ORAL | 1 refills | Status: DC
Start: 2023-10-16 — End: 2024-06-13
  Filled 2023-10-16: qty 90, 90d supply, fill #0
  Filled 2024-04-24: qty 90, 90d supply, fill #1

## 2023-10-16 MED ORDER — TRULICITY 1.5 MG/0.5ML ~~LOC~~ SOAJ
1.5000 mg | SUBCUTANEOUS | 6 refills | Status: DC
Start: 2023-10-16 — End: 2024-06-13
  Filled 2023-10-16: qty 6, 84d supply, fill #0
  Filled 2023-11-08: qty 2, 28d supply, fill #0
  Filled 2023-11-08: qty 6, 84d supply, fill #0
  Filled 2023-12-05: qty 2, 28d supply, fill #1
  Filled 2024-01-08: qty 2, 28d supply, fill #2
  Filled 2024-01-31: qty 2, 28d supply, fill #3
  Filled 2024-02-28: qty 2, 28d supply, fill #4
  Filled 2024-03-27: qty 2, 28d supply, fill #5
  Filled 2024-04-24: qty 2, 28d supply, fill #6
  Filled 2024-05-21: qty 2, 28d supply, fill #7

## 2023-10-16 NOTE — Patient Instructions (Addendum)
Estreimiento, en adultos Constipation, Adult Estreimiento significa que una persona hace menos de tres deposiciones en una semana, tiene dificultades para defecar o las heces (deposiciones) son secas, duras o ms grandes de lo normal. La causa puede ser una afeccin subyacente. Puede empeorar con la edad si una persona toma ciertos medicamentos y no toma suficiente lquido. Siga estas instrucciones en su casa: Comida y bebida  Consuma alimentos con alto contenido de [place], como frijoles, cereales integrales, y frutas y verduras frescas. Limite el consumo de alimentos que sean bajos en fibra y ricos en grasas y azcares procesados, como los fritos y los dulces. Estos incluyen patatas fritas, hamburguesas, galletas, dulces y refrescos. Beba suficiente lquido como para Pharmacologist la orina de color amarillo plido. Instrucciones generales Haga actividad fsica habitualmente o como se lo haya indicado el mdico. Intente practicar 150 minutos de actividad fsica moderada por semana. Vaya al bao siempre que sienta ganas de defecar. No se aguante las ganas. Use los medicamentos de venta libre y los recetados solamente como se lo haya indicado el mdico. Esto incluye suplementos de [place]. En el momento de hacer sus deposiciones: Respire profundamente mientras relaja la parte inferior del abdomen. Practique la relajacin del suelo plvico. Controle su afeccin para detectar cualquier cambio. Informe a su mdico acerca de cualquier cambio. Concurra a todas las visitas de [address] se lo haya indicado el mdico. Esto es importante. Comunquese con un mdico si: Su dolor empeora. Tiene fiebre. No defeca despus de 4 das. Vomita. [place] o no tiene apetito. Tiene sangrado por la abertura entre las nalgas (ano). Las heces son delgadas como un lpiz. Solicite ayuda de inmediato si: Lance Muss, y los sntomas empeoran repentinamente. Observa que se filtran heces o hay sangre en las  heces. Tiene el abdomen distendido. Siente un dolor intenso en el abdomen. Se siente mareado o se desmaya. Resumen Estreimiento significa que una persona hace menos de tres deposiciones en una semana, tiene dificultades para defecar o las heces (deposiciones) son secas, duras o ms grandes de lo normal. Consuma alimentos con alto contenido de [place], como frijoles, cereales integrales, y frutas y verduras frescas. Beba suficiente lquido como para Pharmacologist la orina de color amarillo plido. Use los medicamentos de venta libre y los recetados solamente como se lo haya indicado el mdico. Esto incluye suplementos de [place]. Esta informacin no tiene Theme park manager el consejo del mdico. Asegrese de hacerle al mdico cualquier pregunta que tenga. Document Revised: 12/13/2019 Document Reviewed: 09/21/2022 Elsevier Patient Education  2024 [employer].

## 2023-10-16 NOTE — Progress Notes (Signed)
Subjective:  Patient ID: Brandi Bryant, female    DOB: 1956/01/23  Age: 67 y.o. MRN: [id number]  CC: Medical Management of Chronic Issues (depression)   HPI Brandi Bryant is a 67 y.o. year old female with a history of GERD, hypertension, hyperlipidemia, breast cancer status post left lumpectomy and radiation, type 2 diabetes mellitus (A1c 6.7) previous CVA, TIA in 03/2018, VIN 2.   Interval History: Discussed the use of AI scribe software for clinical note transcription with the patient, who gave verbal consent to proceed.  She presents with multiple complaints. She reports difficulty in obtaining her diabetes medication, specifically Trulicity, due to issues with refills at the pharmacy. This has led to periods of non-adherence to her medication regimen.  Surprisingly her A1c is controlled at 6.7 today.  In addition to her diabetes, the patient also reports feelings of depression. She describes a lack of motivation, preferring to stay in bed and avoiding social interactions. This has been ongoing for approximately two months. The patient attributes these feelings to recent changes in her living situation, as her son has moved out and she is now living alone.  Her car has also been at the [employer] and she has been unable to drive.  Furthermore, the patient complains of persistent stomach pain for 2 months. She reports that the pain is not localized to a specific area and is present regardless of whether she eats or not. The onset of this pain coincides with her eye surgery which she initially had 2 months ago and a repeat last month, leading her to believe that it may be related to the anesthesia or eye drops used during the procedure.  The patient also mentions a history of constipation.  She has no nausea, no vomiting, no diarrhea and denies reflux symptoms.  She reports using over-the-counter medication to manage this symptom.        Past Medical History:   Diagnosis Date   Abdominal pain    Allergy    Arthralgia 08/13/2013   Bell palsy 2006   Bell's palsy    Breast cancer (HCC) 2009   Left Breast Cancer   Cataract    Chest pain 09/30/2013   Cholelithiasis    Chronic sinusitis 05/16/2017   Colon cancer screening 10/27/2015   Complication of anesthesia    " tubal ligation, in [place]"  had weakness, couldnt stand up the next day, was given pills for oxygen for Brain."  1 month after I was having loss of attentiviness, still have to focus  carefully.    Coronary artery disease    CVA (cerebral vascular accident) (HCC) 03/30/2018   Depression    Diabetes mellitus    Type II   Dyspnea    at times- when air conditioner is runnimg, heat also    Encounter for screening colonoscopy 10/30/2015   GERD (gastroesophageal reflux disease)    Hepatic steatosis    History of breast cancer 06/01/2012   HTN (hypertension)    Hx of adenomatous polyp of colon 11/07/2019   Hyperlipidemia    Internal carotid artery stenosis 04/03/2018   Personal history of chemotherapy 2009   Left Breast Cancer   Personal history of radiation therapy 2009   Left Breast Cancer   Stroke [facility])    2006, 2015   TIA (transient ischemic attack) 02/20/2014    Past Surgical History:  Procedure Laterality Date   ANKLE ARTHROSCOPY Right 12/14/2016   Procedure: Right Ankle Arthroscopic Debridement;  Surgeon: Nadara Mustard,  MD;  Location: [facility] OR;  Service: Orthopedics;  Laterality: Right;   Arm surgery     Left tendon lengthen   BREAST LUMPECTOMY Left 2009   CHOLECYSTECTOMY N/A 12/09/2015   Procedure: LAPAROSCOPIC CHOLECYSTECTOMY WITH INTRAOPERATIVE CHOLANGIOGRAM;  Surgeon: Gaynelle Adu, MD;  Location: [facility] OR;  Service: General;  Laterality: N/A;   LEFT HEART CATHETERIZATION WITH CORONARY ANGIOGRAM N/A 10/30/2013   Procedure: LEFT HEART CATHETERIZATION WITH CORONARY ANGIOGRAM;  Surgeon: Wendall Stade, MD;  Location: [facility] CATH LAB;  Service: Cardiovascular;  Laterality: N/A;   porta  cath Right    Removal of Porta cath Right    TUBAL LIGATION      Family History  Problem Relation Age of Onset   Thyroid cancer Sister    Hypertension Mother    Diabetes Mother    Migraines Daughter    Colon cancer Neg Hx    Esophageal cancer Neg Hx    Stomach cancer Neg Hx    Rectal cancer Neg Hx     Social History   Socioeconomic History   Marital status: Single    Spouse name: Not on file   Number of children: 6   Years of education: Not on file   Highest education level: High school graduate  Occupational History   Not on file  Tobacco Use   Smoking status: Never   Smokeless tobacco: Never  Vaping Use   Vaping status: Never Used  Substance and Sexual Activity   Alcohol use: No   Drug use: No   Sexual activity: Not Currently    Birth control/protection: Surgical  Other Topics Concern   Not on file  Social History Narrative   Single with 6 children    lives with her daughter   Right handed   Never smoker no drug use no alcohol   Social Determinants of Health   Financial Resource Strain: Low Risk  (10/16/2023)   Overall Financial Resource Strain (CARDIA)    Difficulty of Paying Living Expenses: Not hard at all  Food Insecurity: Food Insecurity Present (10/16/2023)   Hunger Vital Sign    Worried About Running Out of Food in the Last Year: Sometimes true    Ran Out of Food in the Last Year: Sometimes true  Transportation Needs: Unmet Transportation Needs (10/16/2023)   PRAPARE - Transportation    Lack of Transportation (Medical): Yes    Lack of Transportation (Non-Medical): Yes  Physical Activity: Insufficiently Active (10/16/2023)   Exercise Vital Sign    Days of Exercise per Week: 3 days    Minutes of Exercise per Session: 20 min  Stress: No Stress Concern Present (10/16/2023)   [employer] of Occupational Health - Occupational Stress Questionnaire    Feeling of Stress : Only a little  Social Connections: Moderately Isolated (10/16/2023)    Social Connection and Isolation Panel [NHANES]    Frequency of Communication with Friends and Family: Twice a week    Frequency of Social Gatherings with Friends and Family: Twice a week    Attends Religious Services: Never    Database administrator or Organizations: No    Attends Engineer, structural: Never    Marital Status: Living with partner    Allergies  Allergen Reactions   Gadolinium Derivatives Nausea And Vomiting    Code: VOM, Desc: Pt began vomiting 45 sec after MRI contrast injection of Multihance, Onset Date: [id number]     Iodinated Contrast Media Nausea And Vomiting    Outpatient Medications Prior  to Visit  Medication Sig Dispense Refill   aspirin 325 MG EC tablet Take 1 tablet (325 mg total) by mouth daily. 30 tablet 6   atorvastatin (LIPITOR) 40 MG tablet Take 1 tablet (40 mg total) by mouth daily. 90 tablet 1   cyclobenzaprine (FLEXERIL) 10 MG tablet Take 1 tablet (10 mg total) by mouth every 8 (eight) hours as needed for muscle spasms. 60 tablet 1   diclofenac Sodium (VOLTAREN) 1 % GEL Apply 2 g topically 4 (four) times daily. 150 g 0   diltiazem (CARDIZEM CD) 180 MG 24 hr capsule Take 1 capsule (180 mg total) by mouth daily. 90 capsule 1   glipiZIDE (GLUCOTROL) 5 MG tablet Take 1 tablet (5 mg total) by mouth 2 (two) times daily before a meal. 180 tablet 1   Insulin Pen Needle (TECHLITE PEN NEEDLES) 32G X 4 MM MISC use to inject lantus daily 100 each 0   ketorolac (ACULAR) 0.5 % ophthalmic solution Instill 1 drop into affected eye four times a day starting 3 days prior to surgery 5 mL 0   lisinopril-hydrochlorothiazide (ZESTORETIC) 20-25 MG tablet Take 1 tablet by mouth daily. 90 tablet 3   meclizine (ANTIVERT) 25 MG tablet Take 1 tablet (25 mg total) by mouth 3 (three) times daily as needed for dizziness. 60 tablet 1   ondansetron (ZOFRAN-ODT) 4 MG disintegrating tablet Take 1 tablet (4 mg total) by mouth every 8 (eight) hours as needed for nausea or vomiting.  12 tablet 0   polyethylene glycol powder (GLYCOLAX/MIRALAX) 17 GM/SCOOP powder Mix 17 grams (1 capful) in 4-8 ounces of water/juice and take once daily. 3350 g 1   prednisoLONE acetate (PRED FORTE) 1 % ophthalmic suspension Place 1 drop into the right eye every 2 (two) hours while awake. 5 mL 1   prednisoLONE acetate (PRED FORTE) 1 % ophthalmic suspension Instill 1 drop into right eye as directed every 2 hours while awake 5 mL 1   rizatriptan (MAXALT) 5 MG tablet TAKE 1 TABLET (5 MG TOTAL) BY MOUTH AS NEEDED FOR MIGRAINE. MAY REPEAT IN 2 HOURS IF NEEDED 10 tablet 2   traZODone (DESYREL) 100 MG tablet Take 1 tablet (100 mg total) by mouth at bedtime as needed for sleep. 30 tablet 0   Dulaglutide (TRULICITY) 1.5 MG/0.5ML SOAJ Inject 1.5 mg into the skin once a week. 6 mL 0   pantoprazole (PROTONIX) 40 MG tablet Take 1 tablet (40 mg total) by mouth daily. 30 tablet 3   ciprofloxacin (CILOXAN) 0.3 % ophthalmic solution Instill 1 drop into affected eye four times a day starting 3 days prior to surgery. (Patient not taking: Reported on 10/16/2023) 5 mL 0   ciprofloxacin (CILOXAN) 0.3 % ophthalmic solution Instill 1 drop into affected eye four times a day starting 3 days prior to surgery. (Patient not taking: Reported on 10/16/2023) 5 mL 0   clotrimazole (LOTRIMIN) 1 % cream Apply 1 Application topically 2 (two) times daily. (Patient not taking: Reported on 10/16/2023) 30 g 0   No facility-administered medications prior to visit.     ROS Review of Systems  Constitutional:  Negative for activity change and appetite change.  HENT:  Negative for sinus pressure and sore throat.   Respiratory:  Negative for chest tightness, shortness of breath and wheezing.   Cardiovascular:  Negative for chest pain and palpitations.  Gastrointestinal:  Positive for abdominal pain and constipation. Negative for abdominal distention and blood in stool.  Genitourinary: Negative.   Musculoskeletal: Negative.  Psychiatric/Behavioral:  Negative for behavioral problems and dysphoric mood.     Objective:  BP 124/75   Pulse 66   Ht 5' (1.524 m)   SpO2 95%   BMI 30.53 kg/m      10/16/2023    2:24 PM 05/02/2023    4:08 PM 04/11/2023    1:56 PM  BP/Weight  Systolic BP 124 134 111  Diastolic BP 75 70 65  Wt. (Lbs)  156.3 157.8  BMI  30.53 kg/m2 30.82 kg/m2      Physical Exam Constitutional:      Appearance: She is well-developed.  Cardiovascular:     Rate and Rhythm: Normal rate.     Heart sounds: Normal heart sounds. No murmur heard. Pulmonary:     Effort: Pulmonary effort is normal.     Breath sounds: Normal breath sounds. No wheezing or rales.  Chest:     Chest wall: No tenderness.  Abdominal:     General: Bowel sounds are normal. There is no distension.     Palpations: Abdomen is soft. There is no mass.     Tenderness: There is no abdominal tenderness.  Musculoskeletal:        General: Normal range of motion.     Right lower leg: No edema.     Left lower leg: No edema.  Neurological:     Mental Status: She is alert and oriented to person, place, and time.  Psychiatric:        Mood and Affect: Mood normal.        Latest Ref Rng & Units 01/11/2023    2:33 PM 10/07/2022   11:50 AM 03/07/2022   10:42 PM  CMP  Glucose 70 - 99 mg/dL 409  811  914   BUN 8 - 27 mg/dL 16  13  12    Creatinine 0.57 - 1.00 mg/dL 7.82  9.56  2.13   Sodium 134 - 144 mmol/L 144  140  137   Potassium 3.5 - 5.2 mmol/L 4.2  4.5  4.0   Chloride 96 - 106 mmol/L 104  103  108   CO2 20 - 29 mmol/L 24  23  22    Calcium 8.7 - 10.3 mg/dL 9.8  9.7  8.9   Total Protein 6.0 - 8.5 g/dL 7.2  7.3    Total Bilirubin 0.0 - 1.2 mg/dL 0.8  0.7    Alkaline Phos 44 - 121 IU/L 96  112    AST 0 - 40 IU/L 51  49    ALT 0 - 32 IU/L 37  35      Lipid Panel     Component Value Date/Time   CHOL 126 02/03/2022 1059   TRIG 92 02/03/2022 1059   HDL 57 02/03/2022 1059   CHOLHDL 2.2 02/03/2022 1059   CHOLHDL 2.6  03/31/2018 0348   VLDL 33 03/31/2018 0348   LDLCALC 52 02/03/2022 1059    CBC    Component Value Date/Time   WBC 6.9 03/07/2022 2242   RBC 4.44 03/07/2022 2242   HGB 13.3 03/07/2022 2242   HGB 13.4 09/30/2013 1033   HCT 39.7 03/07/2022 2242   HCT 39.1 09/30/2013 1033   PLT 153 03/07/2022 2242   PLT 150 09/30/2013 1033   MCV 89.4 03/07/2022 2242   MCV 88.5 09/30/2013 1033   MCH 30.0 03/07/2022 2242   MCHC 33.5 03/07/2022 2242   RDW 12.3 03/07/2022 2242   RDW 12.8 09/30/2013 1033   LYMPHSABS 2.2 03/07/2022 2242  LYMPHSABS 2.1 09/30/2013 1033   MONOABS 0.7 03/07/2022 2242   MONOABS 0.4 09/30/2013 1033   EOSABS 0.5 03/07/2022 2242   EOSABS 0.2 09/30/2013 1033   BASOSABS 0.0 03/07/2022 2242   BASOSABS 0.0 09/30/2013 1033    Lab Results  Component Value Date   HGBA1C 6.7 10/16/2023    Assessment & Plan:      Depression Reports feeling depressed for the past two months, characterized by lack of motivation, desire to stay in bed, and avoidance of social interaction. This coincides with her brother moving out and her being alone at home. No suicidal ideation reported. -Start low-dose Lexapro.  Abdominal Pain Reports persistent abdominal pain for the past two months, following eye surgery. Pain is diffuse and not associated with eating. No relief with bowel movements. Also reports constipation. -Start Pantoprazole daily on an empty stomach. -Recommend over-the-counter Miralax for constipation. -Advise increase in dietary fiber intake. -If symptoms persist, consider abdominal imaging and referral to a gastroenterologist.  Type II diabetes Reports difficulty in obtaining Trulicity from the pharmacy. A1c has improved from 7.6 to 6.7. -Sent refills for Trulicity to the pharmacy. -Continue current diabetes management.  Hypertension -Controlled -Continue antihypertensives -Counseled on blood pressure goal of less than 130/80, low-sodium, DASH diet, medication compliance,  150 minutes of moderate intensity exercise per week. Discussed medication compliance, adverse effects.  General Health Maintenance -Administer influenza vaccine today. -Obtain blood work.          Meds ordered this encounter  Medications   Dulaglutide (TRULICITY) 1.5 MG/0.5ML SOAJ    Sig: Inject 1.5 mg into the skin once a week.    Dispense:  6 mL    Refill:  6   escitalopram (LEXAPRO) 10 MG tablet    Sig: Take 1 tablet (10 mg total) by mouth daily.    Dispense:  90 tablet    Refill:  1   pantoprazole (PROTONIX) 40 MG tablet    Sig: Take 1 tablet (40 mg total) by mouth daily.    Dispense:  90 tablet    Refill:  1    Follow-up: Return in about 6 months (around 04/14/2024) for Chronic medical conditions.       Hoy Register, MD, FAAFP. [facility] [place], [place] [phone]   10/16/2023, 5:35 PM

## 2023-10-20 ENCOUNTER — Other Ambulatory Visit: Payer: Self-pay

## 2023-10-24 ENCOUNTER — Other Ambulatory Visit: Payer: Self-pay

## 2023-11-08 ENCOUNTER — Other Ambulatory Visit: Payer: Self-pay

## 2023-11-09 ENCOUNTER — Other Ambulatory Visit: Payer: Self-pay

## 2023-12-05 ENCOUNTER — Other Ambulatory Visit: Payer: Self-pay

## 2024-01-08 ENCOUNTER — Other Ambulatory Visit: Payer: Self-pay

## 2024-01-26 ENCOUNTER — Other Ambulatory Visit: Payer: Self-pay | Admitting: Family Medicine

## 2024-01-26 ENCOUNTER — Other Ambulatory Visit: Payer: Self-pay

## 2024-01-26 DIAGNOSIS — I10 Essential (primary) hypertension: Secondary | ICD-10-CM

## 2024-01-26 MED ORDER — DILTIAZEM HCL ER COATED BEADS 180 MG PO CP24
180.0000 mg | ORAL_CAPSULE | Freq: Every day | ORAL | 1 refills | Status: DC
Start: 2024-01-26 — End: 2024-06-13
  Filled 2024-01-26: qty 90, 90d supply, fill #0
  Filled 2024-02-13: qty 30, 30d supply, fill #1
  Filled 2024-05-21: qty 30, 30d supply, fill #2

## 2024-01-31 ENCOUNTER — Other Ambulatory Visit: Payer: Self-pay

## 2024-02-13 ENCOUNTER — Other Ambulatory Visit: Payer: Self-pay

## 2024-02-13 ENCOUNTER — Other Ambulatory Visit: Payer: Self-pay | Admitting: Family Medicine

## 2024-02-13 DIAGNOSIS — G4700 Insomnia, unspecified: Secondary | ICD-10-CM

## 2024-02-14 ENCOUNTER — Other Ambulatory Visit: Payer: Self-pay

## 2024-02-14 MED ORDER — TRAZODONE HCL 100 MG PO TABS
100.0000 mg | ORAL_TABLET | Freq: Every evening | ORAL | 1 refills | Status: DC | PRN
Start: 2024-02-14 — End: 2024-06-13
  Filled 2024-02-14 – 2024-04-24 (×2): qty 30, 30d supply, fill #0
  Filled 2024-05-21: qty 30, 30d supply, fill #1

## 2024-02-23 ENCOUNTER — Other Ambulatory Visit: Payer: Self-pay

## 2024-02-28 ENCOUNTER — Other Ambulatory Visit: Payer: Self-pay

## 2024-03-06 ENCOUNTER — Other Ambulatory Visit: Payer: Self-pay

## 2024-03-06 ENCOUNTER — Emergency Department (HOSPITAL_COMMUNITY): Payer: Self-pay

## 2024-03-06 ENCOUNTER — Emergency Department (HOSPITAL_COMMUNITY)
Admission: EM | Admit: 2024-03-06 | Discharge: 2024-03-06 | Disposition: A | Discharge status: Home / Self Care | Payer: Self-pay | Attending: Emergency Medicine | Admitting: Emergency Medicine

## 2024-03-06 ENCOUNTER — Encounter (HOSPITAL_COMMUNITY): Payer: Self-pay | Admitting: Emergency Medicine

## 2024-03-06 ENCOUNTER — Ambulatory Visit (HOSPITAL_COMMUNITY)
Admission: EM | Admit: 2024-03-06 | Discharge: 2024-03-06 | Disposition: A | Discharge status: Home / Self Care | Payer: Self-pay | Attending: Emergency Medicine | Admitting: Emergency Medicine

## 2024-03-06 ENCOUNTER — Encounter (HOSPITAL_COMMUNITY): Payer: Self-pay

## 2024-03-06 DIAGNOSIS — Z79899 Other long term (current) drug therapy: Secondary | ICD-10-CM | POA: Insufficient documentation

## 2024-03-06 DIAGNOSIS — Z8673 Personal history of transient ischemic attack (TIA), and cerebral infarction without residual deficits: Secondary | ICD-10-CM | POA: Insufficient documentation

## 2024-03-06 DIAGNOSIS — R1032 Left lower quadrant pain: Secondary | ICD-10-CM

## 2024-03-06 DIAGNOSIS — Z7982 Long term (current) use of aspirin: Secondary | ICD-10-CM | POA: Insufficient documentation

## 2024-03-06 DIAGNOSIS — I2510 Atherosclerotic heart disease of native coronary artery without angina pectoris: Secondary | ICD-10-CM | POA: Insufficient documentation

## 2024-03-06 DIAGNOSIS — Z87442 Personal history of urinary calculi: Secondary | ICD-10-CM | POA: Insufficient documentation

## 2024-03-06 DIAGNOSIS — R109 Unspecified abdominal pain: Secondary | ICD-10-CM

## 2024-03-06 DIAGNOSIS — I10 Essential (primary) hypertension: Secondary | ICD-10-CM | POA: Insufficient documentation

## 2024-03-06 DIAGNOSIS — E119 Type 2 diabetes mellitus without complications: Secondary | ICD-10-CM | POA: Insufficient documentation

## 2024-03-06 DIAGNOSIS — Z794 Long term (current) use of insulin: Secondary | ICD-10-CM | POA: Insufficient documentation

## 2024-03-06 LAB — BASIC METABOLIC PANEL WITH GFR
Anion gap: 8 (ref 5–15)
BUN: 20 mg/dL (ref 8–23)
CO2: 23 mmol/L (ref 22–32)
Calcium: 8.9 mg/dL (ref 8.9–10.3)
Chloride: 107 mmol/L (ref 98–111)
Creatinine, Ser: 0.95 mg/dL (ref 0.44–1.00)
GFR, Estimated: >60 mL/min (ref >60)
Glucose, Bld: 99 mg/dL (ref 70–99)
Potassium: 4.2 mmol/L (ref 3.5–5.1)
Sodium: 138 mmol/L (ref 135–145)

## 2024-03-06 LAB — URINALYSIS, ROUTINE W REFLEX MICROSCOPIC
Bilirubin Urine: NEGATIVE
Glucose, UA: NEGATIVE mg/dL
Hgb urine dipstick: NEGATIVE
Ketones, ur: NEGATIVE mg/dL
Nitrite: NEGATIVE
Protein, ur: NEGATIVE mg/dL
Specific Gravity, Urine: 1.002 — ABNORMAL LOW (ref 1.005–1.030)
pH: 5 (ref 5.0–8.0)

## 2024-03-06 LAB — CBC WITH DIFFERENTIAL/PLATELET
Abs Immature Granulocytes: 0.03 10*3/uL (ref 0.00–0.07)
Basophils Absolute: 0 10*3/uL (ref 0.0–0.1)
Basophils Relative: 0 %
Eosinophils Absolute: 0.6 10*3/uL — ABNORMAL HIGH (ref 0.0–0.5)
Eosinophils Relative: 8 %
HCT: 40.4 % (ref 36.0–46.0)
Hemoglobin: 13.7 g/dL (ref 12.0–15.0)
Immature Granulocytes: 0 %
Lymphocytes Relative: 29 %
Lymphs Abs: 2.1 10*3/uL (ref 0.7–4.0)
MCH: 30.6 pg (ref 26.0–34.0)
MCHC: 33.9 g/dL (ref 30.0–36.0)
MCV: 90.4 fL (ref 80.0–100.0)
Monocytes Absolute: 0.7 10*3/uL (ref 0.1–1.0)
Monocytes Relative: 9 %
Neutro Abs: 4 10*3/uL (ref 1.7–7.7)
Neutrophils Relative %: 54 %
Platelets: 147 10*3/uL — ABNORMAL LOW (ref 150–400)
RBC: 4.47 MIL/uL (ref 3.87–5.11)
RDW: 12.9 % (ref 11.5–15.5)
WBC: 7.4 10*3/uL (ref 4.0–10.5)
nRBC: 0 % (ref 0.0–0.2)

## 2024-03-06 LAB — POCT URINALYSIS DIP (MANUAL ENTRY)
Bilirubin, UA: NEGATIVE
Blood, UA: NEGATIVE
Glucose, UA: NEGATIVE mg/dL
Ketones, POC UA: NEGATIVE mg/dL
Nitrite, UA: NEGATIVE
Protein Ur, POC: NEGATIVE mg/dL
Spec Grav, UA: 1.01 (ref 1.010–1.025)
Urobilinogen, UA: 0.2 U/dL
pH, UA: 5.5 (ref 5.0–8.0)

## 2024-03-06 MED ORDER — HYDROCODONE-ACETAMINOPHEN 5-325 MG PO TABS: 1.0000 | ORAL_TABLET | Freq: Four times a day (QID) | ORAL | 0 refills | Status: DC | PRN

## 2024-03-06 MED ORDER — HYDROMORPHONE HCL 1 MG/ML IJ SOLN
1.0000 mg | Freq: Once | INTRAMUSCULAR | Status: AC
  Administered 2024-03-06: 1 mg via INTRAVENOUS
  Filled 2024-03-06: qty 1

## 2024-03-06 MED ORDER — ONDANSETRON 4 MG PO TBDP: 4.0000 mg | ORAL_TABLET | Freq: Three times a day (TID) | ORAL | 0 refills | Status: DC | PRN

## 2024-03-06 MED ORDER — ONDANSETRON HCL 4 MG/2ML IJ SOLN
4.0000 mg | Freq: Once | INTRAMUSCULAR | Status: AC
  Administered 2024-03-06: 4 mg via INTRAVENOUS
  Filled 2024-03-06: qty 2

## 2024-03-06 NOTE — ED Triage Notes (Signed)
 Abdominal pain and fever started 4 days ago.  Low, left abdominal pain, and pain int left lower back is painful.  Denies issues with urinating.  Last BM was around  2 pm today.    Has taken ibuprofen.

## 2024-03-06 NOTE — ED Notes (Signed)
Pt made aware of UA sample. Pt given specimen cup

## 2024-03-06 NOTE — ED Triage Notes (Signed)
 Pt arrived reporting Left side back/Abdominal pain and fever started 4 days ago. Has been taking motrin, minimal relief.   Denies issues with urinating.  No changes to urine. Does have hx of kidney stone. Last BM today

## 2024-03-06 NOTE — ED Provider Notes (Addendum)
 [place] EMERGENCY DEPARTMENT AT [facility] Provider Note   CSN: [id number] Arrival date & time: 03/06/24  1735     History  Chief Complaint  Patient presents with   Back Pain   Groin Pain    Brandi Bryant is a 68 y.o. female.  Family member able to appropriately interpret.  Patient with onset of left lower quadrant abdominal pain on Sunday day.  So 4 days ago.  Has radiated down into the suprapubic area.  Maybe a little bit over to the right side.  Patient does have a history of kidney stones.  Not associated with any vomiting.  But the pain has intensified some today.  Patient had a normal bowel movement today.  Patient has been taking Motrin with minimal relief.  Patient stated that there was subjective fever that started 4 days ago.  But temp here was 98.2 pulse 69 respiration 16 blood pressure was 144/58.  Oxygen sats are 96%.  Patient is allergic to both MRI dye and CT dye.  But it sounds like it may just be nausea and vomiting.  Medical history significant for hypertension cholelithiasis Bell's palsy coronary artery disease diabetes hyperlipidemia history of TIA in 2015 CVA in 2019.  Past surgical history significant for left heart cath in 2014 done locally patient gallbladder removed in 2017 patient is never used tobacco products.  Patient was seen at [facility] urgent care earlier today and referred for further evaluation.       Home Medications Prior to Admission medications   Medication Sig Start Date End Date Taking? Authorizing Provider  aspirin 325 MG EC tablet Take 1 tablet (325 mg total) by mouth daily. 06/14/18   Hassie Lint, PA-C  atorvastatin (LIPITOR) 40 MG tablet Take 1 tablet (40 mg total) by mouth daily. 07/17/23   Newlin, Enobong, MD  ciprofloxacin (CILOXAN) 0.3 % ophthalmic solution Instill 1 drop into affected eye four times a day starting 3 days prior to surgery. Patient not taking: Reported on 10/16/2023 07/10/23     ciprofloxacin  (CILOXAN) 0.3 % ophthalmic solution Instill 1 drop into affected eye four times a day starting 3 days prior to surgery. Patient not taking: Reported on 10/16/2023 10/06/23     clotrimazole (LOTRIMIN) 1 % cream Apply 1 Application topically 2 (two) times daily. Patient not taking: Reported on 10/16/2023 01/11/23   Newlin, Enobong, MD  cyclobenzaprine (FLEXERIL) 10 MG tablet Take 1 tablet (10 mg total) by mouth every 8 (eight) hours as needed for muscle spasms. Patient not taking: Reported on 03/06/2024 03/10/22   Newlin, Enobong, MD  diclofenac Sodium (VOLTAREN) 1 % GEL Apply 2 g topically 4 (four) times daily. 03/21/23   Raspet, Erin K, PA-C  diltiazem (CARDIZEM CD) 180 MG 24 hr capsule Take 1 capsule (180 mg total) by mouth daily. 01/26/24   Newlin, Enobong, MD  Dulaglutide (TRULICITY) 1.5 MG/0.5ML SOAJ Inject 1.5 mg into the skin once a week. 10/16/23   Newlin, Enobong, MD  escitalopram (LEXAPRO) 10 MG tablet Take 1 tablet (10 mg total) by mouth daily. 10/16/23   Newlin, Enobong, MD  glipiZIDE (GLUCOTROL) 5 MG tablet Take 1 tablet (5 mg total) by mouth 2 (two) times daily before a meal. 05/22/23   Joaquin Mulberry, MD  Insulin Pen Needle (TECHLITE PEN NEEDLES) 32G X 4 MM MISC use to inject lantus daily 04/25/22   Newlin, Enobong, MD  ketorolac (ACULAR) 0.5 % ophthalmic solution Instill 1 drop into affected eye four times a day  starting 3 days prior to surgery 10/06/23     lisinopril-hydrochlorothiazide (ZESTORETIC) 20-25 MG tablet Take 1 tablet by mouth daily. 10/10/23   Newlin, Enobong, MD  meclizine (ANTIVERT) 25 MG tablet Take 1 tablet (25 mg total) by mouth 3 (three) times daily as needed for dizziness. 01/11/23   Newlin, Enobong, MD  ondansetron (ZOFRAN-ODT) 4 MG disintegrating tablet Take 1 tablet (4 mg total) by mouth every 8 (eight) hours as needed for nausea or vomiting. 01/16/22   Darlis Eisenmenger, PA-C  pantoprazole (PROTONIX) 40 MG tablet Take 1 tablet (40 mg total) by mouth daily. 10/16/23   Newlin,  Enobong, MD  polyethylene glycol powder (GLYCOLAX/MIRALAX) 17 GM/SCOOP powder Mix 17 grams (1 capful) in 4-8 ounces of water/juice and take once daily. 04/11/23   Newlin, Enobong, MD  prednisoLONE acetate (PRED FORTE) 1 % ophthalmic suspension Place 1 drop into the right eye every 2 (two) hours while awake. 08/10/23     prednisoLONE acetate (PRED FORTE) 1 % ophthalmic suspension Instill 1 drop into right eye as directed every 2 hours while awake 10/06/23     rizatriptan (MAXALT) 5 MG tablet TAKE 1 TABLET (5 MG TOTAL) BY MOUTH AS NEEDED FOR MIGRAINE. MAY REPEAT IN 2 HOURS IF NEEDED 06/20/22   Newlin, Enobong, MD  traZODone (DESYREL) 100 MG tablet Take 1 tablet (100 mg total) by mouth at bedtime as needed for sleep. 02/14/24   Newlin, Enobong, MD      Allergies    Gadolinium derivatives and Iodinated contrast media    Review of Systems   Review of Systems  Constitutional:  Negative for chills and fever.  HENT:  Negative for ear pain and sore throat.   Eyes:  Negative for pain and visual disturbance.  Respiratory:  Negative for cough and shortness of breath.   Cardiovascular:  Negative for chest pain and palpitations.  Gastrointestinal:  Positive for abdominal pain. Negative for nausea and vomiting.  Genitourinary:  Positive for flank pain. Negative for dysuria and hematuria.  Musculoskeletal:  Negative for arthralgias and back pain.  Skin:  Negative for color change and rash.  Neurological:  Negative for seizures and syncope.  All other systems reviewed and are negative.   Physical Exam Updated Vital Signs BP 129/63 (BP Location: Right Arm)   Pulse 62   Temp 97.9 F (36.6 C) (Oral)   Resp 17   Ht 1.524 m (5')   Wt 70.9 kg   SpO2 99%   BMI 30.53 kg/m  Physical Exam Vitals and nursing note reviewed.  Constitutional:      General: She is not in acute distress.    Appearance: Normal appearance. She is well-developed.  HENT:     Head: Normocephalic and atraumatic.  Eyes:      Extraocular Movements: Extraocular movements intact.     Conjunctiva/sclera: Conjunctivae normal.     Pupils: Pupils are equal, round, and reactive to light.  Cardiovascular:     Rate and Rhythm: Normal rate and regular rhythm.     Heart sounds: No murmur heard. Pulmonary:     Effort: Pulmonary effort is normal. No respiratory distress.     Breath sounds: Normal breath sounds.  Abdominal:     Palpations: Abdomen is soft.     Tenderness: There is abdominal tenderness. There is no guarding.     Comments: Some mild tenderness left lower quadrant.  Musculoskeletal:        General: No swelling.     Cervical back: Normal range  of motion and neck supple.  Skin:    General: Skin is warm and dry.     Capillary Refill: Capillary refill takes less than 2 seconds.  Neurological:     General: No focal deficit present.     Mental Status: She is alert and oriented to person, place, and time.  Psychiatric:        Mood and Affect: Mood normal.     ED Results / Procedures / Treatments   Labs (all labs ordered are listed, but only abnormal results are displayed) Labs Reviewed  URINALYSIS, ROUTINE W REFLEX MICROSCOPIC - Abnormal; Notable for the following components:      Result Value   Color, Urine STRAW (*)    Specific Gravity, Urine 1.002 (*)    Leukocytes,Ua TRACE (*)    Bacteria, UA RARE (*)    All other components within normal limits  CBC WITH DIFFERENTIAL/PLATELET - Abnormal; Notable for the following components:   Platelets 147 (*)    Eosinophils Absolute 0.6 (*)    All other components within normal limits  BASIC METABOLIC PANEL WITH GFR    EKG None  Radiology No results found.  Procedures Procedures    Medications Ordered in ED Medications  ondansetron (ZOFRAN) injection 4 mg (has no administration in time range)  HYDROmorphone (DILAUDID) injection 1 mg (has no administration in time range)    ED Course/ Medical Decision Making/ A&P                                  Medical Decision Making Amount and/or Complexity of Data Reviewed Labs: ordered. Radiology: ordered.  Risk Prescription drug management.   Will give some IV pain medicine and some antinausea medicine.  Here CBC white count normal at 7.4 hemoglobin 13.7 platelets 147.  Basic metabolic panel is normal.  Urinalysis negative for evidence of any urinary tract infection or hematuria.  CT renal study is pending.  CT without any explanation for the patient's pain.  Reassuring that white count is normal.  Urinalysis is normal.  Will treat symptomatically and have her follow-up with her regular doctor.  May need colonoscopy.   Final Clinical Impression(s) / ED Diagnoses Final diagnoses:  Flank pain  Left lower quadrant abdominal pain    Rx / DC Orders ED Discharge Orders     None         Nicklas Barns, MD 03/06/24 2149    Nicklas Barns, MD 03/06/24 [phone]

## 2024-03-06 NOTE — ED Provider Notes (Signed)
 [facility]    CSN: [id number] Arrival date & time: 03/06/24  1422      History   Chief Complaint Chief Complaint  Patient presents with   Abdominal Pain    HPI Brandi Bryant is a 68 y.o. female.   Patient presented clinic over concerns of left lower abdominal pain.  Her son is at bedside and provides interpretation, declined medical interpreter.  Intermittent severe left lower quadrant abdominal pain.  Has not had any dysuria, nausea, vomiting or diarrhea.  Did have a bowel movement this morning that was normal.  Feels like she is having left lower quadrant abdominal pain that radiates into her left flank and across to her right lower abdominal area.  Subjective fevers for the past 4 days.  Has not measured temperature.  Has taken ibuprofen and 2 days of penicillin for her pain without improvement.  The history is provided by the patient and medical records.  Abdominal Pain   Past Medical History:  Diagnosis Date   Abdominal pain    Allergy    Arthralgia 08/13/2013   Bell palsy 2006   Bell's palsy    Breast cancer (HCC) 2009   Left Breast Cancer   Cataract    Chest pain 09/30/2013   Cholelithiasis    Chronic sinusitis 05/16/2017   Colon cancer screening 10/27/2015   Complication of anesthesia    " tubal ligation, in [place]"  had weakness, couldnt stand up the next day, was given pills for oxygen for Brain."  1 month after I was having loss of attentiviness, still have to focus  carefully.    Coronary artery disease    CVA (cerebral vascular accident) (HCC) 03/30/2018   Depression    Diabetes mellitus    Type II   Dyspnea    at times- when air conditioner is runnimg, heat also    Encounter for screening colonoscopy 10/30/2015   GERD (gastroesophageal reflux disease)    Hepatic steatosis    History of breast cancer 06/01/2012   HTN (hypertension)    Hx of adenomatous polyp of colon 11/07/2019   Hyperlipidemia    Internal carotid artery  stenosis 04/03/2018   Personal history of chemotherapy 2009   Left Breast Cancer   Personal history of radiation therapy 2009   Left Breast Cancer   Stroke (HCC)    2006, 2015   TIA (transient ischemic attack) 02/20/2014    Patient Active Problem List   Diagnosis Date Noted   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   ASCUS of cervix with negative high risk HPV 01/28/2023   Genital warts 01/28/2023   AKI (acute kidney injury) (HCC) 07/16/2021   Pyelonephritis 07/15/2021   Epigastric pain 06/08/2021   Vitamin D deficiency 05/13/2021   Insomnia 05/13/2021   Other cirrhosis of liver (HCC) 11/14/2019   Kidney lesion, native, left 11/14/2019   Hx of adenomatous polyp of colon 11/07/2019   Left wrist tendinitis 04/17/2019   Primary osteoarthritis of first carpometacarpal joint of left hand 04/17/2019   Carpal tunnel syndrome 11/07/2018   GERD (gastroesophageal reflux disease) 11/27/2017   Diabetic polyneuropathy associated with type 2 diabetes mellitus (HCC) 10/31/2016   Impingement syndrome of right ankle 10/31/2016   Symptomatic cholelithiasis 12/09/2015   Daily headache 02/21/2014   Trigeminal neuralgia 01/22/2014   Weakness 01/22/2014   Bell's palsy 01/21/2014   NAFLD (nonalcoholic fatty liver disease) 78/29/5621   Facial droop 01/15/2014   Stroke (HCC) 01/15/2014   Type 2 diabetes mellitus  with diabetic neuropathy (HCC) 12/06/2013   Hyperlipidemia 11/07/2013   CAD (coronary artery disease), native coronary artery 11/07/2013   Chest pain 09/30/2013   History of breast cancer 06/01/2012   HTN (hypertension)    Diabetes mellitus (HCC)     Past Surgical History:  Procedure Laterality Date   ANKLE ARTHROSCOPY Right 12/14/2016   Procedure: Right Ankle Arthroscopic Debridement;  Surgeon: Nadara Mustard, MD;  Location: [facility] OR;  Service: Orthopedics;  Laterality: Right;   Arm surgery     Left tendon lengthen   BREAST LUMPECTOMY Left 2009   CHOLECYSTECTOMY N/A  12/09/2015   Procedure: LAPAROSCOPIC CHOLECYSTECTOMY WITH INTRAOPERATIVE CHOLANGIOGRAM;  Surgeon: Gaynelle Adu, MD;  Location: [facility] OR;  Service: General;  Laterality: N/A;   LEFT HEART CATHETERIZATION WITH CORONARY ANGIOGRAM N/A 10/30/2013   Procedure: LEFT HEART CATHETERIZATION WITH CORONARY ANGIOGRAM;  Surgeon: Wendall Stade, MD;  Location: [facility] CATH LAB;  Service: Cardiovascular;  Laterality: N/A;   porta cath Right    Removal of Porta cath Right    TUBAL LIGATION      OB History     Gravida  6   Para  6   Term  6   Preterm      AB      Living  6      SAB      IAB      Ectopic      Multiple      Live Births               Home Medications    Prior to Admission medications   Medication Sig Start Date End Date Taking? Authorizing Provider  aspirin 325 MG EC tablet Take 1 tablet (325 mg total) by mouth daily. 06/14/18   Anders Simmonds, PA-C  atorvastatin (LIPITOR) 40 MG tablet Take 1 tablet (40 mg total) by mouth daily. 07/17/23   Hoy Register, MD  ciprofloxacin (CILOXAN) 0.3 % ophthalmic solution Instill 1 drop into affected eye four times a day starting 3 days prior to surgery. Patient not taking: Reported on 10/16/2023 07/10/23     ciprofloxacin (CILOXAN) 0.3 % ophthalmic solution Instill 1 drop into affected eye four times a day starting 3 days prior to surgery. Patient not taking: Reported on 10/16/2023 10/06/23     clotrimazole (LOTRIMIN) 1 % cream Apply 1 Application topically 2 (two) times daily. Patient not taking: Reported on 10/16/2023 01/11/23   Hoy Register, MD  cyclobenzaprine (FLEXERIL) 10 MG tablet Take 1 tablet (10 mg total) by mouth every 8 (eight) hours as needed for muscle spasms. Patient not taking: Reported on 03/06/2024 03/10/22   Hoy Register, MD  diclofenac Sodium (VOLTAREN) 1 % GEL Apply 2 g topically 4 (four) times daily. 03/21/23   Raspet, Noberto Retort, PA-C  diltiazem (CARDIZEM CD) 180 MG 24 hr capsule Take 1 capsule (180 mg total) by  mouth daily. 01/26/24   Hoy Register, MD  Dulaglutide (TRULICITY) 1.5 MG/0.5ML SOAJ Inject 1.5 mg into the skin once a week. 10/16/23   Hoy Register, MD  escitalopram (LEXAPRO) 10 MG tablet Take 1 tablet (10 mg total) by mouth daily. 10/16/23   Hoy Register, MD  glipiZIDE (GLUCOTROL) 5 MG tablet Take 1 tablet (5 mg total) by mouth 2 (two) times daily before a meal. 05/22/23   Hoy Register, MD  Insulin Pen Needle (TECHLITE PEN NEEDLES) 32G X 4 MM MISC use to inject lantus daily 04/25/22   Hoy Register, MD  ketorolac (ACULAR) 0.5 %  ophthalmic solution Instill 1 drop into affected eye four times a day starting 3 days prior to surgery 10/06/23     lisinopril-hydrochlorothiazide (ZESTORETIC) 20-25 MG tablet Take 1 tablet by mouth daily. 10/10/23   Hoy Register, MD  meclizine (ANTIVERT) 25 MG tablet Take 1 tablet (25 mg total) by mouth 3 (three) times daily as needed for dizziness. 01/11/23   Hoy Register, MD  ondansetron (ZOFRAN-ODT) 4 MG disintegrating tablet Take 1 tablet (4 mg total) by mouth every 8 (eight) hours as needed for nausea or vomiting. 01/16/22   Jeannie Fend, PA-C  pantoprazole (PROTONIX) 40 MG tablet Take 1 tablet (40 mg total) by mouth daily. 10/16/23   Hoy Register, MD  polyethylene glycol powder (GLYCOLAX/MIRALAX) 17 GM/SCOOP powder Mix 17 grams (1 capful) in 4-8 ounces of water/juice and take once daily. 04/11/23   Hoy Register, MD  prednisoLONE acetate (PRED FORTE) 1 % ophthalmic suspension Place 1 drop into the right eye every 2 (two) hours while awake. 08/10/23     prednisoLONE acetate (PRED FORTE) 1 % ophthalmic suspension Instill 1 drop into right eye as directed every 2 hours while awake 10/06/23     rizatriptan (MAXALT) 5 MG tablet TAKE 1 TABLET (5 MG TOTAL) BY MOUTH AS NEEDED FOR MIGRAINE. MAY REPEAT IN 2 HOURS IF NEEDED 06/20/22   Hoy Register, MD  traZODone (DESYREL) 100 MG tablet Take 1 tablet (100 mg total) by mouth at bedtime as needed for sleep.  02/14/24   Hoy Register, MD    Family History Family History  Problem Relation Age of Onset   Thyroid cancer Sister    Hypertension Mother    Diabetes Mother    Migraines Daughter    Colon cancer Neg Hx    Esophageal cancer Neg Hx    Stomach cancer Neg Hx    Rectal cancer Neg Hx     Social History Social History   Tobacco Use   Smoking status: Never   Smokeless tobacco: Never  Vaping Use   Vaping status: Never Used  Substance Use Topics   Alcohol use: No   Drug use: No     Allergies   Gadolinium derivatives and Iodinated contrast media   Review of Systems Review of Systems  Per HPI  Physical Exam Triage Vital Signs ED Triage Vitals  Encounter Vitals Group     BP 03/06/24 1656 (!) 148/68     Systolic BP Percentile --      Diastolic BP Percentile --      Pulse Rate 03/06/24 1656 66     Resp 03/06/24 1656 (!) 22     Temp 03/06/24 1656 98.2 F (36.8 C)     Temp Source 03/06/24 1656 Oral     SpO2 03/06/24 1656 96 %     Weight --      Height --      Head Circumference --      Peak Flow --      Pain Score 03/06/24 1650 7     Pain Loc --      Pain Education --      Exclude from Growth Chart --    No data found.  Updated Vital Signs BP (!) 148/68 (BP Location: Left Arm) Comment (BP Location): large cuff  Pulse 66   Temp 98.2 F (36.8 C) (Oral)   Resp (!) 22   SpO2 96%   Visual Acuity Right Eye Distance:   Left Eye Distance:   Bilateral Distance:  Right Eye Near:   Left Eye Near:    Bilateral Near:     Physical Exam Vitals and nursing note reviewed.  Constitutional:      Appearance: She is well-developed.  HENT:     Head: Normocephalic and atraumatic.  Cardiovascular:     Rate and Rhythm: Normal rate.  Pulmonary:     Effort: Pulmonary effort is normal. No respiratory distress.  Abdominal:     General: Abdomen is flat. Bowel sounds are normal.     Palpations: Abdomen is soft.     Tenderness: There is abdominal tenderness in the  left lower quadrant. There is guarding.  Skin:    General: Skin is warm and dry.  Neurological:     General: No focal deficit present.     Mental Status: She is alert.  Psychiatric:        Mood and Affect: Mood normal.      UC Treatments / Results  Labs (all labs ordered are listed, but only abnormal results are displayed) Labs Reviewed  POCT URINALYSIS DIP (MANUAL ENTRY) - Abnormal; Notable for the following components:      Result Value   Color, UA other (*)    Clarity, UA cloudy (*)    Leukocytes, UA Trace (*)    All other components within normal limits    EKG   Radiology No results found.  Procedures Procedures (including critical care time)  Medications Ordered in UC Medications - No data to display  Initial Impression / Assessment and Plan / UC Course  I have reviewed the triage vital signs and the nursing notes.  Pertinent labs & imaging results that were available during my care of the patient were reviewed by me and considered in my medical decision making (see chart for details).  Vitals in triage reviewed, patient is hemodynamically stable.  Abdomen with active bowel sounds, left lower quadrant tenderness with guarding.  She is grimacing afterwards with tears in her eyes.  UA unremarkable, low concern for UTI.  Discussed due to presentation that she would benefit from further advanced evaluation at the emergency department.  Patient verbalized understanding, will head to [facility] emergency department.    Final Clinical Impressions(s) / UC Diagnoses   Final diagnoses:  Left lower quadrant abdominal pain   Discharge Instructions   None    ED Prescriptions   None    PDMP not reviewed this encounter.   Harlow Lighter, Emileigh Kellett  N, FNP 03/06/24 1724

## 2024-03-06 NOTE — ED Notes (Signed)
 Patient is being discharged from the Urgent Care and sent to the Emergency Department via POV . Per [place]  Harlow Lighter, NP, patient is in need of higher level of care due to limited resources and abdominal pain. Patient is aware and verbalizes understanding of plan of care.  Vitals:   03/06/24 1656  BP: (!) 148/68  Pulse: 66  Resp: (!) 22  Temp: 98.2 F (36.8 C)  SpO2: 96%

## 2024-03-06 NOTE — ED Notes (Signed)
 After IV dilaudid pt de-stated to 91% on room air. Placed on 2 L nasal cannula. SPO2 increased to 98-100%. Pt remains on Vs monitor. Call bell within reach. Son at bedside

## 2024-03-06 NOTE — Discharge Instructions (Addendum)
 Follow-up with the wellness clinic.  May need colonoscopy for further evaluation.  Today's and labs and CT scan without any acute findings.  Nothing to explain the pain.  So therefore further evaluation is required as an outpatient. Information also given to follow-up with GI medicine.  Take the pain medicine as needed.  Take the antinausea medicine as needed.

## 2024-03-07 ENCOUNTER — Telehealth: Payer: Self-pay

## 2024-03-07 NOTE — Telephone Encounter (Signed)
 Copied from CRM [id number]. Topic: Appointments - Scheduling Inquiry for Clinic >> Mar 07, 2024  2:12 PM Tiffany S wrote: Reason for CRM: Patient was recently seen at ER patient is calling to make follow up appt no availability until June patient is in pain please call patient to schedule   [phone]

## 2024-03-12 ENCOUNTER — Inpatient Hospital Stay: Payer: Self-pay | Admitting: Pharmacist

## 2024-03-13 NOTE — Progress Notes (Unsigned)
 Patient ID: Brandi Bryant, female   DOB: 09/01/1956, 68 y.o.   MRN: [id number]   After being seen at ED 03/06/2024 for abdominal pain  From ED note: Family member able to appropriately interpret. Patient with onset of left lower quadrant abdominal pain on Sunday day. So 4 days ago. Has radiated down into the suprapubic area. Maybe a little bit over to the right side. Patient does have a history of kidney stones. Not associated with any vomiting. But the pain has intensified some today. Patient had a normal bowel movement today. Patient has been taking Motrin  with minimal relief. Patient stated that there was subjective fever that started 4 days ago. But temp here was 98.2 pulse 69 respiration 16 blood pressure was 144/58. Oxygen sats are 96%. Patient is allergic to both MRI dye and CT dye. But it sounds like it may just be nausea and vomiting. Medical history significant for hypertension cholelithiasis Bell's palsy coronary artery disease diabetes hyperlipidemia history of TIA in 2015 CVA in 2019. Past surgical history significant for left heart cath in 2014 done locally patient gallbladder removed in 2017 patient is never used tobacco products. Patient was seen at [facility] urgent care earlier today and referred for further evaluation.

## 2024-03-14 ENCOUNTER — Other Ambulatory Visit: Payer: Self-pay

## 2024-03-14 ENCOUNTER — Ambulatory Visit: Payer: Self-pay | Attending: Physician Assistant | Admitting: Physician Assistant

## 2024-03-14 VITALS — BP 116/61 | HR 64 | Temp 97.9°F | Ht 60.0 in | Wt 153.0 lb

## 2024-03-14 DIAGNOSIS — E1169 Type 2 diabetes mellitus with other specified complication: Secondary | ICD-10-CM

## 2024-03-14 DIAGNOSIS — R1013 Epigastric pain: Secondary | ICD-10-CM

## 2024-03-14 DIAGNOSIS — Z794 Long term (current) use of insulin: Secondary | ICD-10-CM

## 2024-03-14 DIAGNOSIS — Z603 Acculturation difficulty: Secondary | ICD-10-CM

## 2024-03-14 DIAGNOSIS — R0981 Nasal congestion: Secondary | ICD-10-CM

## 2024-03-14 DIAGNOSIS — Z09 Encounter for follow-up examination after completed treatment for conditions other than malignant neoplasm: Secondary | ICD-10-CM

## 2024-03-14 LAB — POCT GLYCOSYLATED HEMOGLOBIN (HGB A1C): HbA1c, POC (controlled diabetic range): 7.1 % — AB (ref 0.0–7.0)

## 2024-03-14 LAB — GLUCOSE, POCT (MANUAL RESULT ENTRY): POC Glucose: 237 mg/dL — AB (ref 70–99)

## 2024-03-14 MED ORDER — AMOXICILLIN 500 MG PO CAPS
500.0000 mg | ORAL_CAPSULE | Freq: Three times a day (TID) | ORAL | 0 refills | Status: AC
Start: 2024-03-14 — End: 2024-03-24
  Filled 2024-03-14: qty 30, 10d supply, fill #0

## 2024-03-14 MED ORDER — CHLORPHENIRAMINE-PHENYLEPHRINE 4-10 MG PO TABS
1.0000 | ORAL_TABLET | ORAL | 0 refills | Status: DC | PRN
Start: 2024-03-14 — End: 2024-06-13
  Filled 2024-03-14: qty 20, 4d supply, fill #0

## 2024-03-14 NOTE — Patient Instructions (Signed)
Vaporizador de Soil scientist fro [employer]  Un vaporizador de aire fro o humidificador es un dispositivo que emite vapor fro [employer]. Si tiene tos o un resfro, podra ayudar a [employer]. El vapor agrega humedad al aire. Esto puede ayudar a diluir la mucosidad y Charity fundraiser menos pegajosa. Cuando la mucosidad est diluida y es menos pegajosa, es posible que usted pueda Solicitor. Cmo usar un vaporizador de Soil scientist fro [employer] las instrucciones del fabricante sobre cmo usar el vaporizador. No lo use si es alrgico al [employer]. No lo haga funcionar todo el tiempo. Tener el vaporizador en funcionamiento todo el tiempo puede hacer que crezca moho o se reproduzcan las bacterias en el interior. Deje de usarlo si los sntomas respiratorios empeoran. Cmo cuidar el vaporizador de aire fro Use agua con bajo contenido mineral en el vaporizador. Puede comprar agua destilada en la tienda local. Tambin puede usar cartuchos o filtros de desmineralizacin para el humidificador. Mantenga el vaporizador limpio. Si no se limpia bien, pueden crecer moho o bacterias en l. Esto puede causar una enfermedad. Lmpielo despus de cada uso. Siga las instrucciones sobre cmo limpiarlo. Lmpielo y squelo bien antes de guardarlo. Esta informacin no tiene Theme park manager el consejo del mdico. Asegrese de hacerle al mdico cualquier pregunta que tenga. Document Revised: 08/23/2022 Document Reviewed: 08/23/2022 Elsevier Patient Education  2024 [employer].

## 2024-03-27 ENCOUNTER — Other Ambulatory Visit: Payer: Self-pay

## 2024-03-29 ENCOUNTER — Encounter: Payer: Self-pay | Admitting: Physician Assistant

## 2024-04-16 ENCOUNTER — Ambulatory Visit: Payer: Self-pay | Admitting: Family Medicine

## 2024-04-24 ENCOUNTER — Telehealth: Payer: Self-pay | Admitting: Family Medicine

## 2024-04-24 ENCOUNTER — Other Ambulatory Visit: Payer: Self-pay

## 2024-04-24 DIAGNOSIS — E1140 Type 2 diabetes mellitus with diabetic neuropathy, unspecified: Secondary | ICD-10-CM

## 2024-04-24 DIAGNOSIS — E1169 Type 2 diabetes mellitus with other specified complication: Secondary | ICD-10-CM

## 2024-04-24 MED ORDER — GLIPIZIDE 5 MG PO TABS
5.0000 mg | ORAL_TABLET | Freq: Two times a day (BID) | ORAL | 1 refills | Status: DC
Start: 2024-04-24 — End: 2024-06-13
  Filled 2024-04-24 – 2024-05-21 (×2): qty 180, 90d supply, fill #0

## 2024-04-24 NOTE — Telephone Encounter (Addendum)
 This patient came in the office today requesting for a medication refill. Can you please send a refill for glipiZIDE  5 MG tablet. Thank you.   Send to the:   [facility]  [address], [address], [place] [address]

## 2024-04-26 ENCOUNTER — Encounter: Payer: Self-pay | Admitting: Gastroenterology

## 2024-05-06 ENCOUNTER — Other Ambulatory Visit: Payer: Self-pay

## 2024-05-21 ENCOUNTER — Other Ambulatory Visit: Payer: Self-pay

## 2024-06-03 NOTE — Progress Notes (Deleted)
 Brandi Bryant [id number] 12-Jul-1956   Chief Complaint: Dyspepsia  Referring Provider: Danton Jon HERO, PA-C Primary GI MD: Dr. Avram  HPI: Brandi Bryant is a 68 y.o. female with past medical history of HTN, CAD, stroke, MASH cirrhosis, GERD, cholelithiasis s/p cholecystectomy 2017, T2DM, prior breast cancer, HLD, vitamin D  deficiency, adenomatous colon polyps who presents today for a complaint of dyspepsia.    03/06/2024 patient seen in the ED for complaint of LLQ abdominal pain ongoing for 4 days with radiation to suprapubic area.  Some relief with Motrin .  Subjective fever though temp in the ED was normal.  Patient is allergic to both MRI dye and CT dye, though possibly reaction is just nausea and vomiting.  Patient was given IV pain medication as well as antiemetics.  Had a normal WBC count, hemoglobin 13.7, platelets 147.  UA negative.  CT without any explanation for patient's pain.  She was treated symptomatically and advised to follow-up with PCP.  Noted that she may need colonoscopy.  Last visit with Dr. Avram 11/12/2019.   Previous GI Procedures/Imaging   CT renal stone study 03/06/2024 1. No acute intra-abdominal pathology identified. 2. Morphologic changes of advanced cirrhosis. 3. Indeterminate 6 mm hypodensity within the right hepatic lobe, new since prior examination. Dedicated nonemergent contrast enhanced MRI examination is recommended for further evaluation. 4. Pancolonic diverticulosis without evidence of acute diverticulitis. 5.  Aortic Atherosclerosis (ICD10-I70.0).  Colonoscopy 10/25/2019 - One diminutive polyp in the rectum, removed with a cold snare. Resected and retrieved.  - Diverticulosis in the left colon.  - The examination was otherwise normal on direct and retroflexion views. - Recall 7 years Path: Surgical [P], rectum, polyp - TUBULAR ADENOMA, NEGATIVE FOR HIGH GRADE DYSPLASIA.  EGD 10/25/2019 - Normal esophagus.  - Normal  stomach. - Normal examined duodenum.  - No specimens collected.   Past Medical History:  Diagnosis Date   Abdominal pain    Allergy    Arthralgia 08/13/2013   Bell palsy 2006   Bell's palsy    Breast cancer (HCC) 2009   Left Breast Cancer   Cataract    Chest pain 09/30/2013   Cholelithiasis    Chronic sinusitis 05/16/2017   Colon cancer screening 10/27/2015   Complication of anesthesia     tubal ligation, in [place]  had weakness, couldnt stand up the next day, was given pills for oxygen for Brain.  1 month after I was having loss of attentiviness, still have to focus  carefully.    Coronary artery disease    CVA (cerebral vascular accident) (HCC) 03/30/2018   Depression    Diabetes mellitus    Type II   Dyspnea    at times- when air conditioner is runnimg, heat also    Encounter for screening colonoscopy 10/30/2015   GERD (gastroesophageal reflux disease)    Hepatic steatosis    History of breast cancer 06/01/2012   HTN (hypertension)    Hx of adenomatous polyp of colon 11/07/2019   Hyperlipidemia    Internal carotid artery stenosis 04/03/2018   Personal history of chemotherapy 2009   Left Breast Cancer   Personal history of radiation therapy 2009   Left Breast Cancer   Stroke [facility])    2006, 2015   TIA (transient ischemic attack) 02/20/2014    Past Surgical History:  Procedure Laterality Date   ANKLE ARTHROSCOPY Right 12/14/2016   Procedure: Right Ankle Arthroscopic Debridement;  Surgeon: Jerona Harden GAILS, MD;  Location: [facility] OR;  Service: Orthopedics;  Laterality: Right;   Arm surgery     Left tendon lengthen   BREAST LUMPECTOMY Left 2009   CHOLECYSTECTOMY N/A 12/09/2015   Procedure: LAPAROSCOPIC CHOLECYSTECTOMY WITH INTRAOPERATIVE CHOLANGIOGRAM;  Surgeon: Camellia Blush, MD;  Location: [facility] OR;  Service: General;  Laterality: N/A;   LEFT HEART CATHETERIZATION WITH CORONARY ANGIOGRAM N/A 10/30/2013   Procedure: LEFT HEART CATHETERIZATION WITH CORONARY ANGIOGRAM;  Surgeon:  Maude JAYSON Emmer, MD;  Location: [facility] CATH LAB;  Service: Cardiovascular;  Laterality: N/A;   porta cath Right    Removal of Porta cath Right    TUBAL LIGATION      Current Outpatient Medications  Medication Sig Dispense Refill   aspirin  325 MG EC tablet Take 1 tablet (325 mg total) by mouth daily. 30 tablet 6   atorvastatin  (LIPITOR) 40 MG tablet Take 1 tablet (40 mg total) by mouth daily. 90 tablet 1   Chlorpheniramine -Phenylephrine  4-10 MG tablet Take 1 tablet by mouth every 4 (four) hours as needed for congestion. 20 tablet 0   clotrimazole  (LOTRIMIN ) 1 % cream Apply 1 Application topically 2 (two) times daily. (Patient not taking: Reported on 03/14/2024) 30 g 0   cyclobenzaprine  (FLEXERIL ) 10 MG tablet Take 1 tablet (10 mg total) by mouth every 8 (eight) hours as needed for muscle spasms. (Patient not taking: Reported on 03/14/2024) 60 tablet 1   diclofenac  Sodium (VOLTAREN ) 1 % GEL Apply 2 g topically 4 (four) times daily. 150 g 0   diltiazem  (CARDIZEM  CD) 180 MG 24 hr capsule Take 1 capsule (180 mg total) by mouth daily. 90 capsule 1   Dulaglutide  (TRULICITY ) 1.5 MG/0.5ML SOAJ Inject 1.5 mg into the skin once a week. 6 mL 6   escitalopram  (LEXAPRO ) 10 MG tablet Take 1 tablet (10 mg total) by mouth daily. 90 tablet 1   glipiZIDE  (GLUCOTROL ) 5 MG tablet Take 1 tablet (5 mg total) by mouth 2 (two) times daily before a meal. 180 tablet 1   HYDROcodone -acetaminophen  (NORCO/VICODIN) 5-325 MG tablet Take 1 tablet by mouth every 6 (six) hours as needed for moderate pain (pain score 4-6). 14 tablet 0   Insulin  Pen Needle (TECHLITE PEN NEEDLES) 32G X 4 MM MISC use to inject lantus  daily 100 each 0   ketorolac  (ACULAR ) 0.5 % ophthalmic solution Instill 1 drop into affected eye four times a day starting 3 days prior to surgery 5 mL 0   lisinopril -hydrochlorothiazide  (ZESTORETIC ) 20-25 MG tablet Take 1 tablet by mouth daily. 90 tablet 3   meclizine  (ANTIVERT ) 25 MG tablet Take 1 tablet (25 mg total) by  mouth 3 (three) times daily as needed for dizziness. 60 tablet 1   ondansetron  (ZOFRAN -ODT) 4 MG disintegrating tablet Take 1 tablet (4 mg total) by mouth every 8 (eight) hours as needed for nausea or vomiting. 12 tablet 0   ondansetron  (ZOFRAN -ODT) 4 MG disintegrating tablet Take 1 tablet (4 mg total) by mouth every 8 (eight) hours as needed for nausea or vomiting. 12 tablet 0   pantoprazole  (PROTONIX ) 40 MG tablet Take 1 tablet (40 mg total) by mouth daily. 90 tablet 1   polyethylene glycol powder (GLYCOLAX /MIRALAX ) 17 GM/SCOOP powder Mix 17 grams (1 capful) in 4-8 ounces of water/juice and take once daily. 3350 g 1   prednisoLONE  acetate (PRED FORTE ) 1 % ophthalmic suspension Place 1 drop into the right eye every 2 (two) hours while awake. 5 mL 1   prednisoLONE  acetate (PRED FORTE ) 1 % ophthalmic suspension Instill 1 drop into right eye as  directed every 2 hours while awake 5 mL 1   rizatriptan  (MAXALT ) 5 MG tablet TAKE 1 TABLET (5 MG TOTAL) BY MOUTH AS NEEDED FOR MIGRAINE. MAY REPEAT IN 2 HOURS IF NEEDED 10 tablet 2   traZODone  (DESYREL ) 100 MG tablet Take 1 tablet (100 mg total) by mouth at bedtime as needed for sleep. 30 tablet 1   No current facility-administered medications for this visit.    Allergies as of 06/04/2024 - Review Complete 03/14/2024  Allergen Reaction Noted   Gadolinium derivatives Nausea And Vomiting 06/12/2009   Iodinated contrast media Nausea And Vomiting 10/08/2019    Family History  Problem Relation Age of Onset   Thyroid cancer Sister    Hypertension Mother    Diabetes Mother    Migraines Daughter    Colon cancer Neg Hx    Esophageal cancer Neg Hx    Stomach cancer Neg Hx    Rectal cancer Neg Hx     Social History   Tobacco Use   Smoking status: Never   Smokeless tobacco: Never  Vaping Use   Vaping status: Never Used  Substance Use Topics   Alcohol use: No   Drug use: No     Review of Systems:    Constitutional: No weight loss, fever, chills,  weakness or fatigue Eyes: No change in vision Ears, Nose, Throat:  No change in hearing or congestion Skin: No rash or itching Cardiovascular: No chest pain, chest pressure or palpitations   Respiratory: No SOB or cough Gastrointestinal: See HPI and otherwise negative Genitourinary: No dysuria or change in urinary frequency Neurological: No headache, dizziness or syncope Musculoskeletal: No new muscle or joint pain Hematologic: No bleeding or bruising    Physical Exam:  Vital signs: There were no vitals taken for this visit.  Constitutional: NAD, Well developed, Well nourished, alert and cooperative Head:  Normocephalic and atraumatic.  Eyes: No scleral icterus. Conjunctiva pink. Mouth: No oral lesions. Respiratory: Respirations even and unlabored. Lungs clear to auscultation bilaterally.  No wheezes, crackles, or rhonchi.  Cardiovascular:  Regular rate and rhythm. No murmurs. No peripheral edema. Gastrointestinal:  Soft, nondistended, nontender. No rebound or guarding. Normal bowel sounds. No appreciable masses or hepatomegaly. Rectal:  Not performed.  Neurologic:  Alert and oriented x4;  grossly normal neurologically.  Skin:   Dry and intact without significant lesions or rashes. Psychiatric: Oriented to person, place and time. Demonstrates good judgement and reason without abnormal affect or behaviors.   RELEVANT LABS AND IMAGING: CBC    Component Value Date/Time   WBC 7.4 03/06/2024 2026   RBC 4.47 03/06/2024 2026   HGB 13.7 03/06/2024 2026   HGB 13.4 09/30/2013 1033   HCT 40.4 03/06/2024 2026   HCT 39.1 09/30/2013 1033   PLT 147 (L) 03/06/2024 2026   PLT 150 09/30/2013 1033   MCV 90.4 03/06/2024 2026   MCV 88.5 09/30/2013 1033   MCH 30.6 03/06/2024 2026   MCHC 33.9 03/06/2024 2026   RDW 12.9 03/06/2024 2026   RDW 12.8 09/30/2013 1033   LYMPHSABS 2.1 03/06/2024 2026   LYMPHSABS 2.1 09/30/2013 1033   MONOABS 0.7 03/06/2024 2026   MONOABS 0.4 09/30/2013 1033    EOSABS 0.6 (H) 03/06/2024 2026   EOSABS 0.2 09/30/2013 1033   BASOSABS 0.0 03/06/2024 2026   BASOSABS 0.0 09/30/2013 1033    CMP     Component Value Date/Time   NA 138 03/06/2024 2026   NA 144 01/11/2023 1433   NA 139 09/30/2013 1033  K 4.2 03/06/2024 2026   K 4.2 09/30/2013 1033   CL 107 03/06/2024 2026   CL 107 01/28/2013 0854   CO2 23 03/06/2024 2026   CO2 22 09/30/2013 1033   GLUCOSE 99 03/06/2024 2026   GLUCOSE 202 (H) 04/18/2014 0853   GLUCOSE 243 (H) 01/28/2013 0854   BUN 20 03/06/2024 2026   BUN 16 01/11/2023 1433   BUN 13.0 09/30/2013 1033   CREATININE 0.95 03/06/2024 2026   CREATININE 0.68 12/13/2016 0841   CREATININE 0.8 09/30/2013 1033   CALCIUM  8.9 03/06/2024 2026   CALCIUM  9.9 09/30/2013 1033   PROT 7.2 01/11/2023 1433   PROT 7.5 09/30/2013 1033   ALBUMIN 4.3 01/11/2023 1433   ALBUMIN 3.7 09/30/2013 1033   AST 51 (H) 01/11/2023 1433   AST 50 (H) 09/30/2013 1033   ALT 37 (H) 01/11/2023 1433   ALT 45 09/30/2013 1033   ALKPHOS 96 01/11/2023 1433   ALKPHOS 108 09/30/2013 1033   BILITOT 0.8 01/11/2023 1433   BILITOT 0.89 09/30/2013 1033   GFRNONAA >60 03/06/2024 2026   GFRNONAA >89 12/13/2016 0841   GFRAA 94 12/02/2020 0837   GFRAA >89 12/13/2016 0841   Echocardiogram 11/16/2021 1. Left ventricular ejection fraction, by estimation, is 60 to 65% . The left ventricle has normal function. The left ventricle has no regional wall motion abnormalities. There is mild concentric left ventricular hypertrophy. Left ventricular diastolic parameters are consistent with Grade I diastolic dysfunction ( impaired relaxation) .  2. Right ventricular systolic function is normal. The right ventricular size is normal. There is normal pulmonary artery systolic pressure.  3. The mitral valve is normal in structure. Trivial mitral valve regurgitation. No evidence of mitral stenosis.  4. The aortic valve is tricuspid. Aortic valve regurgitation is not visualized. No aortic  stenosis is present.  5. The inferior vena cava is normal in size with greater than 50% respiratory variability, suggesting right atrial pressure of 3 mmHg.  Assessment/Plan:       Camie Furbish, PA-C  Gastroenterology 06/03/2024, 7:26 PM  Patient Care Team: Delbert Clam, MD as PCP - General (Family Medicine) Croitoru, Jerel, MD as PCP - Cardiology (Cardiology) Lonn Hicks, MD as Consulting Physician (Hematology and Oncology)

## 2024-06-04 ENCOUNTER — Ambulatory Visit: Payer: Self-pay | Admitting: Gastroenterology

## 2024-06-13 ENCOUNTER — Encounter: Payer: Self-pay | Admitting: Family Medicine

## 2024-06-13 ENCOUNTER — Ambulatory Visit: Payer: Self-pay | Attending: Family Medicine | Admitting: Family Medicine

## 2024-06-13 ENCOUNTER — Other Ambulatory Visit: Payer: Self-pay

## 2024-06-13 VITALS — BP 126/68 | HR 59 | Resp 18 | Ht 60.0 in | Wt 154.2 lb

## 2024-06-13 DIAGNOSIS — L989 Disorder of the skin and subcutaneous tissue, unspecified: Secondary | ICD-10-CM

## 2024-06-13 DIAGNOSIS — F32A Depression, unspecified: Secondary | ICD-10-CM

## 2024-06-13 DIAGNOSIS — Z7984 Long term (current) use of oral hypoglycemic drugs: Secondary | ICD-10-CM

## 2024-06-13 DIAGNOSIS — A630 Anogenital (venereal) warts: Secondary | ICD-10-CM

## 2024-06-13 DIAGNOSIS — E1140 Type 2 diabetes mellitus with diabetic neuropathy, unspecified: Secondary | ICD-10-CM

## 2024-06-13 DIAGNOSIS — N9089 Other specified noninflammatory disorders of vulva and perineum: Secondary | ICD-10-CM

## 2024-06-13 DIAGNOSIS — E785 Hyperlipidemia, unspecified: Secondary | ICD-10-CM

## 2024-06-13 DIAGNOSIS — G4700 Insomnia, unspecified: Secondary | ICD-10-CM

## 2024-06-13 DIAGNOSIS — I10 Essential (primary) hypertension: Secondary | ICD-10-CM

## 2024-06-13 DIAGNOSIS — E119 Type 2 diabetes mellitus without complications: Secondary | ICD-10-CM

## 2024-06-13 DIAGNOSIS — Z1231 Encounter for screening mammogram for malignant neoplasm of breast: Secondary | ICD-10-CM

## 2024-06-13 DIAGNOSIS — Z7985 Long-term (current) use of injectable non-insulin antidiabetic drugs: Secondary | ICD-10-CM

## 2024-06-13 DIAGNOSIS — Z794 Long term (current) use of insulin: Secondary | ICD-10-CM

## 2024-06-13 LAB — POCT GLYCOSYLATED HEMOGLOBIN (HGB A1C): Hemoglobin A1C: 7.9 % — AB (ref 4.0–5.6)

## 2024-06-13 MED ORDER — ROSUVASTATIN CALCIUM 10 MG PO TABS
10.0000 mg | ORAL_TABLET | Freq: Every day | ORAL | 3 refills | Status: DC
  Filled 2024-06-13: qty 90, 90d supply, fill #0

## 2024-06-13 MED ORDER — TRAZODONE HCL 100 MG PO TABS
100.0000 mg | ORAL_TABLET | Freq: Every evening | ORAL | 3 refills | Status: DC | PRN
  Filled 2024-06-13: qty 90, 90d supply, fill #0

## 2024-06-13 MED ORDER — DILTIAZEM HCL ER COATED BEADS 180 MG PO CP24
180.0000 mg | ORAL_CAPSULE | Freq: Every day | ORAL | 3 refills | Status: AC
  Filled 2024-06-13: qty 90, 90d supply, fill #0
  Filled 2024-07-03: qty 30, 30d supply, fill #0
  Filled 2024-07-26: qty 30, 30d supply, fill #1
  Filled 2024-09-16: qty 30, 30d supply, fill #2
  Filled 2024-10-21: qty 30, 30d supply, fill #3
  Filled 2024-11-22: qty 30, 30d supply, fill #4
  Filled 2024-12-19: qty 30, 30d supply, fill #5

## 2024-06-13 MED ORDER — LISINOPRIL-HYDROCHLOROTHIAZIDE 20-25 MG PO TABS
1.0000 | ORAL_TABLET | Freq: Every day | ORAL | 3 refills | Status: DC
  Filled 2024-06-13 – 2024-07-03 (×2): qty 90, 90d supply, fill #0
  Filled 2024-07-03: qty 30, 30d supply, fill #0
  Filled 2024-07-03: qty 90, 90d supply, fill #0
  Filled 2024-07-03: qty 30, 30d supply, fill #0
  Filled 2024-08-13: qty 90, 90d supply, fill #1
  Filled 2024-10-21: qty 90, 90d supply, fill #2

## 2024-06-13 MED ORDER — GLIPIZIDE 5 MG PO TABS
5.0000 mg | ORAL_TABLET | Freq: Two times a day (BID) | ORAL | 3 refills | Status: DC
  Filled 2024-06-13: qty 180, 90d supply, fill #0

## 2024-06-13 MED ORDER — TRULICITY 3 MG/0.5ML ~~LOC~~ SOAJ
3.0000 mg | SUBCUTANEOUS | 1 refills | Status: DC
  Filled 2024-06-13: qty 2, 28d supply, fill #0
  Filled 2024-07-08 – 2024-07-26 (×2): qty 2, 28d supply, fill #1
  Filled 2024-08-30: qty 2, 28d supply, fill #2
  Filled 2024-09-30: qty 2, 28d supply, fill #3
  Filled 2024-10-21: qty 2, 28d supply, fill #4
  Filled 2024-11-22: qty 2, 28d supply, fill #5
  Filled ????-??-??: fill #1

## 2024-06-13 MED ORDER — ESCITALOPRAM OXALATE 10 MG PO TABS
10.0000 mg | ORAL_TABLET | Freq: Every day | ORAL | 3 refills | Status: DC
  Filled 2024-06-13: qty 90, 90d supply, fill #0

## 2024-06-13 NOTE — Progress Notes (Signed)
 Knots formed on pubic area.

## 2024-06-13 NOTE — Patient Instructions (Signed)
 Please call for your GYN appointment [facility] [address] [place], [place] [address] Phone [phone]

## 2024-06-13 NOTE — Progress Notes (Signed)
 Subjective:  Patient ID: Brandi Bryant, female    DOB: Jan 21, 1956  Age: 68 y.o. MRN: [id number]  CC: Diabetes     Discussed the use of AI scribe software for clinical note transcription with the patient, who gave verbal consent to proceed.  History of Present Illness Brandi Bryant is a 68 year old female with a history of GERD, hypertension, hyperlipidemia, breast cancer status post left lumpectomy and radiation, type 2 diabetes mellitus (A1c 6.7) previous CVA, TIA in 03/2018, VIN 2. who presents with a new lump in the pubic area.  She noticed the lump three days ago, which is larger than before, without discharge, pain, or itching. The lump is on the top part of the pubic area, unlike previous lumps that were on the bottom part. She has a history of VIN2 and vulvar warts and was previously referred to a women's clinic but never made it there.  She is not taking atorvastatin  due to difficulty swallowing the pill and has not taken Lexapro  due to refill issues, though she feels it is needed. She takes diltiazem  180 mg daily along with lisinopril /HCTZ for her hypertension, uses meclizine  for vertigo as needed, and takes medication for nausea regularly. She stopped pantoprazole  due to increased stomach pain.  Her A1c has increased from 7.1 to 7.9, and she is on Trulicity  1.5 mg. She has difficulty managing her diet due to her daughter cooking meals, affecting her diabetes control.    Past Medical History:  Diagnosis Date   Abdominal pain    Allergy    Arthralgia 08/13/2013   Bell palsy 2006   Bell's palsy    Breast cancer (HCC) 2009   Left Breast Cancer   Cataract    Chest pain 09/30/2013   Cholelithiasis    Chronic sinusitis 05/16/2017   Colon cancer screening 10/27/2015   Complication of anesthesia     tubal ligation, in [place]  had weakness, couldnt stand up the next day, was given pills for oxygen for Brain.  1 month after I was having loss of attentiviness,  still have to focus  carefully.    Coronary artery disease    CVA (cerebral vascular accident) (HCC) 03/30/2018   Depression    Diabetes mellitus    Type II   Dyspnea    at times- when air conditioner is runnimg, heat also    Encounter for screening colonoscopy 10/30/2015   GERD (gastroesophageal reflux disease)    Hepatic steatosis    History of breast cancer 06/01/2012   HTN (hypertension)    Hx of adenomatous polyp of colon 11/07/2019   Hyperlipidemia    Internal carotid artery stenosis 04/03/2018   Personal history of chemotherapy 2009   Left Breast Cancer   Personal history of radiation therapy 2009   Left Breast Cancer   Stroke [facility])    2006, 2015   TIA (transient ischemic attack) 02/20/2014    Past Surgical History:  Procedure Laterality Date   ANKLE ARTHROSCOPY Right 12/14/2016   Procedure: Right Ankle Arthroscopic Debridement;  Surgeon: Jerona Harden GAILS, MD;  Location: [facility] OR;  Service: Orthopedics;  Laterality: Right;   Arm surgery     Left tendon lengthen   BREAST LUMPECTOMY Left 2009   CHOLECYSTECTOMY N/A 12/09/2015   Procedure: LAPAROSCOPIC CHOLECYSTECTOMY WITH INTRAOPERATIVE CHOLANGIOGRAM;  Surgeon: Camellia Blush, MD;  Location: [facility] OR;  Service: General;  Laterality: N/A;   LEFT HEART CATHETERIZATION WITH CORONARY ANGIOGRAM N/A 10/30/2013   Procedure: LEFT HEART CATHETERIZATION WITH CORONARY ANGIOGRAM;  Surgeon: Maude JAYSON Emmer, MD;  Location: [facility] CATH LAB;  Service: Cardiovascular;  Laterality: N/A;   porta cath Right    Removal of Porta cath Right    TUBAL LIGATION      Family History  Problem Relation Age of Onset   Thyroid cancer Sister    Hypertension Mother    Diabetes Mother    Migraines Daughter    Colon cancer Neg Hx    Esophageal cancer Neg Hx    Stomach cancer Neg Hx    Rectal cancer Neg Hx     Social History   Socioeconomic History   Marital status: Single    Spouse name: Not on file   Number of children: 6   Years of education: Not on file    Highest education level: High school graduate  Occupational History   Not on file  Tobacco Use   Smoking status: Never   Smokeless tobacco: Never  Vaping Use   Vaping status: Never Used  Substance and Sexual Activity   Alcohol use: No   Drug use: No   Sexual activity: Not Currently    Birth control/protection: Surgical  Other Topics Concern   Not on file  Social History Narrative   Single with 6 children    lives with her daughter   Right handed   Never smoker no drug use no alcohol   Social Drivers of Corporate investment banker Strain: Low Risk  (10/16/2023)   Overall Financial Resource Strain (CARDIA)    Difficulty of Paying Living Expenses: Not hard at all  Food Insecurity: Food Insecurity Present (10/16/2023)   Hunger Vital Sign    Worried About Running Out of Food in the Last Year: Sometimes true    Ran Out of Food in the Last Year: Sometimes true  Transportation Needs: Unmet Transportation Needs (10/16/2023)   PRAPARE - Transportation    Lack of Transportation (Medical): Yes    Lack of Transportation (Non-Medical): Yes  Physical Activity: Insufficiently Active (10/16/2023)   Exercise Vital Sign    Days of Exercise per Week: 3 days    Minutes of Exercise per Session: 20 min  Stress: No Stress Concern Present (10/16/2023)   [employer] of Occupational Health - Occupational Stress Questionnaire    Feeling of Stress : Only a little  Social Connections: Moderately Isolated (10/16/2023)   Social Connection and Isolation Panel    Frequency of Communication with Friends and Family: Twice a week    Frequency of Social Gatherings with Friends and Family: Twice a week    Attends Religious Services: Never    Database administrator or Organizations: No    Attends Engineer, structural: Never    Marital Status: Living with partner    Allergies  Allergen Reactions   Gadolinium Derivatives Nausea And Vomiting    Code: VOM, Desc: Pt began vomiting 45 sec  after MRI contrast injection of Multihance , Onset Date: [id number]     Iodinated Contrast Media Nausea And Vomiting    Outpatient Medications Prior to Visit  Medication Sig Dispense Refill   aspirin  325 MG EC tablet Take 1 tablet (325 mg total) by mouth daily. 30 tablet 6   Dulaglutide  (TRULICITY ) 1.5 MG/0.5ML SOAJ Inject 1.5 mg into the skin once a week. 6 mL 6   glipiZIDE  (GLUCOTROL ) 5 MG tablet Take 1 tablet (5 mg total) by mouth 2 (two) times daily before a meal. 180 tablet 1   HYDROcodone -acetaminophen  (NORCO/VICODIN) 5-325 MG tablet  Take 1 tablet by mouth every 6 (six) hours as needed for moderate pain (pain score 4-6). 14 tablet 0   lisinopril -hydrochlorothiazide  (ZESTORETIC ) 20-25 MG tablet Take 1 tablet by mouth daily. 90 tablet 3   traZODone  (DESYREL ) 100 MG tablet Take 1 tablet (100 mg total) by mouth at bedtime as needed for sleep. 30 tablet 1   atorvastatin  (LIPITOR) 40 MG tablet Take 1 tablet (40 mg total) by mouth daily. (Patient not taking: Reported on 06/13/2024) 90 tablet 1   Chlorpheniramine -Phenylephrine  4-10 MG tablet Take 1 tablet by mouth every 4 (four) hours as needed for congestion. (Patient not taking: Reported on 06/13/2024) 20 tablet 0   clotrimazole  (LOTRIMIN ) 1 % cream Apply 1 Application topically 2 (two) times daily. (Patient not taking: Reported on 06/13/2024) 30 g 0   diltiazem  (CARDIZEM  CD) 180 MG 24 hr capsule Take 1 capsule (180 mg total) by mouth daily. (Patient not taking: Reported on 06/13/2024) 90 capsule 1   escitalopram  (LEXAPRO ) 10 MG tablet Take 1 tablet (10 mg total) by mouth daily. (Patient not taking: Reported on 06/13/2024) 90 tablet 1   Insulin  Pen Needle (TECHLITE PEN NEEDLES) 32G X 4 MM MISC use to inject lantus  daily (Patient not taking: Reported on 06/13/2024) 100 each 0   meclizine  (ANTIVERT ) 25 MG tablet Take 1 tablet (25 mg total) by mouth 3 (three) times daily as needed for dizziness. (Patient not taking: Reported on 06/13/2024) 60 tablet 1    ondansetron  (ZOFRAN -ODT) 4 MG disintegrating tablet Take 1 tablet (4 mg total) by mouth every 8 (eight) hours as needed for nausea or vomiting. (Patient not taking: Reported on 06/13/2024) 12 tablet 0   cyclobenzaprine  (FLEXERIL ) 10 MG tablet Take 1 tablet (10 mg total) by mouth every 8 (eight) hours as needed for muscle spasms. (Patient not taking: Reported on 06/13/2024) 60 tablet 1   diclofenac  Sodium (VOLTAREN ) 1 % GEL Apply 2 g topically 4 (four) times daily. (Patient not taking: Reported on 06/13/2024) 150 g 0   ketorolac  (ACULAR ) 0.5 % ophthalmic solution Instill 1 drop into affected eye four times a day starting 3 days prior to surgery (Patient not taking: Reported on 06/13/2024) 5 mL 0   ondansetron  (ZOFRAN -ODT) 4 MG disintegrating tablet Take 1 tablet (4 mg total) by mouth every 8 (eight) hours as needed for nausea or vomiting. (Patient not taking: Reported on 06/13/2024) 12 tablet 0   pantoprazole  (PROTONIX ) 40 MG tablet Take 1 tablet (40 mg total) by mouth daily. (Patient not taking: Reported on 06/13/2024) 90 tablet 1   polyethylene glycol powder (GLYCOLAX /MIRALAX ) 17 GM/SCOOP powder Mix 17 grams (1 capful) in 4-8 ounces of water/juice and take once daily. (Patient not taking: Reported on 06/13/2024) 3350 g 1   prednisoLONE  acetate (PRED FORTE ) 1 % ophthalmic suspension Place 1 drop into the right eye every 2 (two) hours while awake. (Patient not taking: Reported on 06/13/2024) 5 mL 1   prednisoLONE  acetate (PRED FORTE ) 1 % ophthalmic suspension Instill 1 drop into right eye as directed every 2 hours while awake (Patient not taking: Reported on 06/13/2024) 5 mL 1   rizatriptan  (MAXALT ) 5 MG tablet TAKE 1 TABLET (5 MG TOTAL) BY MOUTH AS NEEDED FOR MIGRAINE. MAY REPEAT IN 2 HOURS IF NEEDED (Patient not taking: Reported on 06/13/2024) 10 tablet 2   No facility-administered medications prior to visit.     ROS Review of Systems  Constitutional:  Negative for activity change and appetite change.   HENT:  Negative for sinus  pressure and sore throat.   Respiratory:  Negative for chest tightness, shortness of breath and wheezing.   Cardiovascular:  Negative for chest pain and palpitations.  Gastrointestinal:  Negative for abdominal distention, abdominal pain and constipation.  Genitourinary: Negative.   Musculoskeletal: Negative.   Psychiatric/Behavioral:  Negative for behavioral problems and dysphoric mood.     Objective:  BP 126/68 (BP Location: Left Arm, Patient Position: Sitting, Cuff Size: Normal)   Pulse (!) 59   Resp 18   Ht 5' (1.524 m)   Wt 154 lb 3.2 oz (69.9 kg)   SpO2 98%   BMI 30.12 kg/m      06/13/2024    8:48 AM 03/14/2024   10:02 AM 03/06/2024   11:40 PM  BP/Weight  Systolic BP 126 116 140  Diastolic BP 68 61 63  Wt. (Lbs) 154.2 153   BMI 30.12 kg/m2 29.88 kg/m2       Physical Exam Constitutional:      Appearance: She is well-developed.  Cardiovascular:     Rate and Rhythm: Bradycardia present.     Heart sounds: Normal heart sounds. No murmur heard. Pulmonary:     Effort: Pulmonary effort is normal.     Breath sounds: Normal breath sounds. No wheezing or rales.  Chest:     Chest wall: No tenderness.  Abdominal:     General: Bowel sounds are normal. There is no distension.     Palpations: Abdomen is soft. There is no mass.     Tenderness: There is no abdominal tenderness.  Genitourinary:    Comments: Hyperpigmented nodule and mons pubis which is not tender Musculoskeletal:        General: Normal range of motion.     Right lower leg: No edema.     Left lower leg: No edema.  Neurological:     Mental Status: She is alert and oriented to person, place, and time.  Psychiatric:        Mood and Affect: Mood normal.        Latest Ref Rng & Units 03/06/2024    8:26 PM 01/11/2023    2:33 PM 10/07/2022   11:50 AM  CMP  Glucose 70 - 99 mg/dL 99  845  886   BUN 8 - 23 mg/dL 20  16  13    Creatinine 0.44 - 1.00 mg/dL 9.04  9.15  9.25   Sodium 135  - 145 mmol/L 138  144  140   Potassium 3.5 - 5.1 mmol/L 4.2  4.2  4.5   Chloride 98 - 111 mmol/L 107  104  103   CO2 22 - 32 mmol/L 23  24  23    Calcium  8.9 - 10.3 mg/dL 8.9  9.8  9.7   Total Protein 6.0 - 8.5 g/dL  7.2  7.3   Total Bilirubin 0.0 - 1.2 mg/dL  0.8  0.7   Alkaline Phos 44 - 121 IU/L  96  112   AST 0 - 40 IU/L  51  49   ALT 0 - 32 IU/L  37  35     Lipid Panel     Component Value Date/Time   CHOL 126 02/03/2022 1059   TRIG 92 02/03/2022 1059   HDL 57 02/03/2022 1059   CHOLHDL 2.2 02/03/2022 1059   CHOLHDL 2.6 03/31/2018 0348   VLDL 33 03/31/2018 0348   LDLCALC 52 02/03/2022 1059    CBC    Component Value Date/Time   WBC 7.4 03/06/2024 2026  RBC 4.47 03/06/2024 2026   HGB 13.7 03/06/2024 2026   HGB 13.4 09/30/2013 1033   HCT 40.4 03/06/2024 2026   HCT 39.1 09/30/2013 1033   PLT 147 (L) 03/06/2024 2026   PLT 150 09/30/2013 1033   MCV 90.4 03/06/2024 2026   MCV 88.5 09/30/2013 1033   MCH 30.6 03/06/2024 2026   MCHC 33.9 03/06/2024 2026   RDW 12.9 03/06/2024 2026   RDW 12.8 09/30/2013 1033   LYMPHSABS 2.1 03/06/2024 2026   LYMPHSABS 2.1 09/30/2013 1033   MONOABS 0.7 03/06/2024 2026   MONOABS 0.4 09/30/2013 1033   EOSABS 0.6 (H) 03/06/2024 2026   EOSABS 0.2 09/30/2013 1033   BASOSABS 0.0 03/06/2024 2026   BASOSABS 0.0 09/30/2013 1033    Lab Results  Component Value Date   HGBA1C 7.9 (A) 06/13/2024    Lab Results  Component Value Date   HGBA1C 7.9 (A) 06/13/2024   HGBA1C 7.1 (A) 03/14/2024   HGBA1C 6.7 10/16/2023        Assessment & Plan Vulvar Lesion New pubic lesion, similar to a mole and a little lump. -Previous history of VIN 2 - I had referred her to GYN last year but she never made it. - Refer to gynecologist for follow-up on previous vulvar warts and provide GYN clinic contact information.  Type 2 Diabetes Mellitus/long-term use of oral diabetic medication/long-term use of non-insulin  injectable A1c increased from 7.1 to 7.9.  Dietary habits impacted by daughter's cooking. Increasing dulaglutide  expected to improve control. - Increase dulaglutide  (Trulicity ) to 3 mg. -Continue all other diabetic medications - Advise monitoring of dietary intake, particularly starches and carbohydrates.  Hyperlipidemia Not taking atorvastatin  due to swallowing difficulty. Rosuvastatin  chosen for ease of swallowing. - Prescribe rosuvastatin  (Crestor ) 10 mg.  Depression Difficulty obtaining Lexapro  due to refill issues. Needs medication. - Ensure Lexapro  prescription is refilled and accessible.   Insomnia Trazodone  refilled   General Health Maintenance Due for mammogram. - Order mammogram.     No orders of the defined types were placed in this encounter.   Follow-up: No follow-ups on file.       Corrina Sabin, MD, FAAFP. [facility] [place], [place] [phone]   06/13/2024, 9:06 AM

## 2024-06-15 LAB — MICROALBUMIN / CREATININE URINE RATIO
Creatinine, Urine: 85.7 mg/dL
Microalb/Creat Ratio: 11 mg/g{creat} (ref 0–29)
Microalbumin, Urine: 9.7 ug/mL

## 2024-06-17 ENCOUNTER — Ambulatory Visit: Payer: Self-pay | Admitting: Family Medicine

## 2024-07-03 ENCOUNTER — Other Ambulatory Visit: Payer: Self-pay

## 2024-07-08 ENCOUNTER — Other Ambulatory Visit: Payer: Self-pay

## 2024-07-16 ENCOUNTER — Other Ambulatory Visit: Payer: Self-pay

## 2024-07-17 ENCOUNTER — Emergency Department (HOSPITAL_COMMUNITY): Payer: MEDICAID

## 2024-07-17 ENCOUNTER — Other Ambulatory Visit: Payer: Self-pay

## 2024-07-17 ENCOUNTER — Inpatient Hospital Stay (HOSPITAL_COMMUNITY)
Admission: EM | Admit: 2024-07-17 | Discharge: 2024-07-22 | DRG: 690 | Disposition: A | Discharge status: Home / Self Care | Payer: MEDICAID | Attending: Internal Medicine | Admitting: Internal Medicine

## 2024-07-17 ENCOUNTER — Encounter (HOSPITAL_COMMUNITY): Payer: Self-pay

## 2024-07-17 DIAGNOSIS — N179 Acute kidney failure, unspecified: Principal | ICD-10-CM | POA: Diagnosis present

## 2024-07-17 DIAGNOSIS — Z833 Family history of diabetes mellitus: Secondary | ICD-10-CM

## 2024-07-17 DIAGNOSIS — Z9221 Personal history of antineoplastic chemotherapy: Secondary | ICD-10-CM

## 2024-07-17 DIAGNOSIS — A419 Sepsis, unspecified organism: Secondary | ICD-10-CM

## 2024-07-17 DIAGNOSIS — K7469 Other cirrhosis of liver: Secondary | ICD-10-CM | POA: Diagnosis present

## 2024-07-17 DIAGNOSIS — Z794 Long term (current) use of insulin: Secondary | ICD-10-CM

## 2024-07-17 DIAGNOSIS — N3000 Acute cystitis without hematuria: Principal | ICD-10-CM | POA: Diagnosis present

## 2024-07-17 DIAGNOSIS — G510 Bell's palsy: Secondary | ICD-10-CM | POA: Diagnosis present

## 2024-07-17 DIAGNOSIS — K766 Portal hypertension: Secondary | ICD-10-CM | POA: Diagnosis present

## 2024-07-17 DIAGNOSIS — Z7982 Long term (current) use of aspirin: Secondary | ICD-10-CM

## 2024-07-17 DIAGNOSIS — Z808 Family history of malignant neoplasm of other organs or systems: Secondary | ICD-10-CM

## 2024-07-17 DIAGNOSIS — N390 Urinary tract infection, site not specified: Secondary | ICD-10-CM | POA: Diagnosis present

## 2024-07-17 DIAGNOSIS — Z853 Personal history of malignant neoplasm of breast: Secondary | ICD-10-CM

## 2024-07-17 DIAGNOSIS — E1142 Type 2 diabetes mellitus with diabetic polyneuropathy: Secondary | ICD-10-CM | POA: Diagnosis present

## 2024-07-17 DIAGNOSIS — Z79899 Other long term (current) drug therapy: Secondary | ICD-10-CM

## 2024-07-17 DIAGNOSIS — K219 Gastro-esophageal reflux disease without esophagitis: Secondary | ICD-10-CM | POA: Diagnosis present

## 2024-07-17 DIAGNOSIS — I10 Essential (primary) hypertension: Secondary | ICD-10-CM | POA: Diagnosis present

## 2024-07-17 DIAGNOSIS — E119 Type 2 diabetes mellitus without complications: Secondary | ICD-10-CM

## 2024-07-17 DIAGNOSIS — E66811 Obesity, class 1: Secondary | ICD-10-CM | POA: Diagnosis present

## 2024-07-17 DIAGNOSIS — E785 Hyperlipidemia, unspecified: Secondary | ICD-10-CM | POA: Diagnosis present

## 2024-07-17 DIAGNOSIS — A084 Viral intestinal infection, unspecified: Secondary | ICD-10-CM | POA: Diagnosis present

## 2024-07-17 DIAGNOSIS — Z8249 Family history of ischemic heart disease and other diseases of the circulatory system: Secondary | ICD-10-CM

## 2024-07-17 DIAGNOSIS — E7800 Pure hypercholesterolemia, unspecified: Secondary | ICD-10-CM

## 2024-07-17 DIAGNOSIS — E876 Hypokalemia: Secondary | ICD-10-CM | POA: Diagnosis present

## 2024-07-17 DIAGNOSIS — Z8673 Personal history of transient ischemic attack (TIA), and cerebral infarction without residual deficits: Secondary | ICD-10-CM

## 2024-07-17 DIAGNOSIS — N3001 Acute cystitis with hematuria: Secondary | ICD-10-CM

## 2024-07-17 DIAGNOSIS — I2510 Atherosclerotic heart disease of native coronary artery without angina pectoris: Secondary | ICD-10-CM | POA: Diagnosis present

## 2024-07-17 DIAGNOSIS — B9620 Unspecified Escherichia coli [E. coli] as the cause of diseases classified elsewhere: Secondary | ICD-10-CM | POA: Insufficient documentation

## 2024-07-17 DIAGNOSIS — R7881 Bacteremia: Secondary | ICD-10-CM | POA: Insufficient documentation

## 2024-07-17 DIAGNOSIS — Z6830 Body mass index (BMI) 30.0-30.9, adult: Secondary | ICD-10-CM

## 2024-07-17 DIAGNOSIS — Z1152 Encounter for screening for COVID-19: Secondary | ICD-10-CM

## 2024-07-17 DIAGNOSIS — Z860101 Personal history of adenomatous and serrated colon polyps: Secondary | ICD-10-CM

## 2024-07-17 DIAGNOSIS — K760 Fatty (change of) liver, not elsewhere classified: Secondary | ICD-10-CM | POA: Diagnosis present

## 2024-07-17 DIAGNOSIS — Z7984 Long term (current) use of oral hypoglycemic drugs: Secondary | ICD-10-CM

## 2024-07-17 DIAGNOSIS — I25118 Atherosclerotic heart disease of native coronary artery with other forms of angina pectoris: Secondary | ICD-10-CM

## 2024-07-17 DIAGNOSIS — E1169 Type 2 diabetes mellitus with other specified complication: Secondary | ICD-10-CM

## 2024-07-17 DIAGNOSIS — Z923 Personal history of irradiation: Secondary | ICD-10-CM

## 2024-07-17 LAB — URINALYSIS, ROUTINE W REFLEX MICROSCOPIC
Bilirubin Urine: NEGATIVE
Glucose, UA: 50 mg/dL — AB
Ketones, ur: NEGATIVE mg/dL
Nitrite: NEGATIVE
Protein, ur: 100 mg/dL — AB
Specific Gravity, Urine: 1.011 (ref 1.005–1.030)
WBC, UA: >50 WBC/hpf (ref 0–5)
pH: 5 (ref 5.0–8.0)

## 2024-07-17 LAB — COMPREHENSIVE METABOLIC PANEL WITH GFR
ALT: 53 U/L — ABNORMAL HIGH (ref 0–44)
AST: 94 U/L — ABNORMAL HIGH (ref 15–41)
Albumin: 2.7 g/dL — ABNORMAL LOW (ref 3.5–5.0)
Alkaline Phosphatase: 120 U/L (ref 38–126)
Anion gap: 13 (ref 5–15)
BUN: 62 mg/dL — ABNORMAL HIGH (ref 8–23)
CO2: 17 mmol/L — ABNORMAL LOW (ref 22–32)
Calcium: 8.2 mg/dL — ABNORMAL LOW (ref 8.9–10.3)
Chloride: 101 mmol/L (ref 98–111)
Creatinine, Ser: 1.88 mg/dL — ABNORMAL HIGH (ref 0.44–1.00)
GFR, Estimated: 29 mL/min — ABNORMAL LOW (ref >60)
Glucose, Bld: 216 mg/dL — ABNORMAL HIGH (ref 70–99)
Potassium: 3.3 mmol/L — ABNORMAL LOW (ref 3.5–5.1)
Sodium: 131 mmol/L — ABNORMAL LOW (ref 135–145)
Total Bilirubin: 1.8 mg/dL — ABNORMAL HIGH (ref 0.0–1.2)
Total Protein: 7 g/dL (ref 6.5–8.1)

## 2024-07-17 LAB — CBG MONITORING, ED: Glucose-Capillary: 127 mg/dL — ABNORMAL HIGH (ref 70–99)

## 2024-07-17 LAB — CBC
HCT: 41.1 % (ref 36.0–46.0)
Hemoglobin: 14.6 g/dL (ref 12.0–15.0)
MCH: 30.7 pg (ref 26.0–34.0)
MCHC: 35.5 g/dL (ref 30.0–36.0)
MCV: 86.5 fL (ref 80.0–100.0)
Platelets: 109 K/uL — ABNORMAL LOW (ref 150–400)
RBC: 4.75 MIL/uL (ref 3.87–5.11)
RDW: 13.3 % (ref 11.5–15.5)
WBC: 9.3 K/uL (ref 4.0–10.5)
nRBC: 0 % (ref 0.0–0.2)

## 2024-07-17 LAB — MAGNESIUM: Magnesium: 1.7 mg/dL (ref 1.7–2.4)

## 2024-07-17 LAB — I-STAT CG4 LACTIC ACID, ED
Lactic Acid, Venous: 1.7 mmol/L (ref 0.5–1.9)
Lactic Acid, Venous: 1.8 mmol/L (ref 0.5–1.9)

## 2024-07-17 LAB — LIPASE, BLOOD: Lipase: 26 U/L (ref 11–51)

## 2024-07-17 LAB — TROPONIN I (HIGH SENSITIVITY)
Troponin I (High Sensitivity): 12 ng/L (ref <18)
Troponin I (High Sensitivity): 12 ng/L (ref <18)

## 2024-07-17 LAB — RESP PANEL BY RT-PCR (RSV, FLU A&B, COVID)  RVPGX2
Influenza A by PCR: NEGATIVE
Influenza B by PCR: NEGATIVE
Resp Syncytial Virus by PCR: NEGATIVE
SARS Coronavirus 2 by RT PCR: NEGATIVE

## 2024-07-17 LAB — HIV ANTIBODY (ROUTINE TESTING W REFLEX): HIV Screen 4th Generation wRfx: NONREACTIVE

## 2024-07-17 LAB — GLUCOSE, CAPILLARY: Glucose-Capillary: 114 mg/dL — ABNORMAL HIGH (ref 70–99)

## 2024-07-17 MED ORDER — LACTATED RINGERS IV SOLN: INTRAVENOUS | Status: DC

## 2024-07-17 MED ORDER — POTASSIUM CHLORIDE CRYS ER 20 MEQ PO TBCR
20.0000 meq | EXTENDED_RELEASE_TABLET | Freq: Once | ORAL | Status: AC
  Administered 2024-07-17: 20 meq via ORAL
  Filled 2024-07-17: qty 1

## 2024-07-17 MED ORDER — ACETAMINOPHEN 650 MG RE SUPP: 650.0000 mg | Freq: Four times a day (QID) | RECTAL | Status: DC | PRN

## 2024-07-17 MED ORDER — ASPIRIN 325 MG PO TBEC
325.0000 mg | DELAYED_RELEASE_TABLET | Freq: Every day | ORAL | Status: DC
  Administered 2024-07-18 – 2024-07-22 (×5): 325 mg via ORAL
  Filled 2024-07-17 (×5): qty 1

## 2024-07-17 MED ORDER — ACETAMINOPHEN 500 MG PO TABS
1000.0000 mg | ORAL_TABLET | Freq: Once | ORAL | Status: AC
  Administered 2024-07-17: 1000 mg via ORAL
  Filled 2024-07-17: qty 2

## 2024-07-17 MED ORDER — ONDANSETRON 4 MG PO TBDP
4.0000 mg | ORAL_TABLET | Freq: Once | ORAL | Status: AC | PRN
  Administered 2024-07-17: 4 mg via ORAL
  Filled 2024-07-17: qty 1

## 2024-07-17 MED ORDER — SODIUM CHLORIDE 0.9 % IV BOLUS
1000.0000 mL | Freq: Once | INTRAVENOUS | Status: AC
  Administered 2024-07-17: 1000 mL via INTRAVENOUS

## 2024-07-17 MED ORDER — TRAZODONE HCL 50 MG PO TABS: 100.0000 mg | ORAL_TABLET | Freq: Every evening | ORAL | Status: DC | PRN

## 2024-07-17 MED ORDER — POLYETHYLENE GLYCOL 3350 17 G PO PACK: 17.0000 g | PACK | Freq: Every day | ORAL | Status: DC | PRN

## 2024-07-17 MED ORDER — ROSUVASTATIN CALCIUM 5 MG PO TABS
10.0000 mg | ORAL_TABLET | Freq: Every day | ORAL | Status: DC
  Administered 2024-07-18: 10 mg via ORAL
  Filled 2024-07-17: qty 2

## 2024-07-17 MED ORDER — ACETAMINOPHEN 325 MG PO TABS
650.0000 mg | ORAL_TABLET | Freq: Four times a day (QID) | ORAL | Status: DC | PRN
  Administered 2024-07-17 – 2024-07-19 (×4): 650 mg via ORAL
  Filled 2024-07-17 (×4): qty 2

## 2024-07-17 MED ORDER — INSULIN ASPART 100 UNIT/ML IJ SOLN
0.0000 [IU] | Freq: Three times a day (TID) | INTRAMUSCULAR | Status: DC
  Administered 2024-07-17 – 2024-07-18 (×2): 2 [IU] via SUBCUTANEOUS
  Administered 2024-07-18: 3 [IU] via SUBCUTANEOUS
  Administered 2024-07-18: 2 [IU] via SUBCUTANEOUS
  Administered 2024-07-19 (×2): 3 [IU] via SUBCUTANEOUS
  Administered 2024-07-19 – 2024-07-20 (×2): 2 [IU] via SUBCUTANEOUS
  Administered 2024-07-20 (×2): 3 [IU] via SUBCUTANEOUS
  Administered 2024-07-21: 2 [IU] via SUBCUTANEOUS
  Administered 2024-07-21: 3 [IU] via SUBCUTANEOUS
  Administered 2024-07-21: 2 [IU] via SUBCUTANEOUS
  Administered 2024-07-22: 3 [IU] via SUBCUTANEOUS

## 2024-07-17 MED ORDER — SODIUM CHLORIDE 0.9% FLUSH
3.0000 mL | Freq: Two times a day (BID) | INTRAVENOUS | Status: DC
  Administered 2024-07-17 – 2024-07-22 (×9): 3 mL via INTRAVENOUS

## 2024-07-17 MED ORDER — DILTIAZEM HCL ER COATED BEADS 180 MG PO CP24
180.0000 mg | ORAL_CAPSULE | Freq: Every day | ORAL | Status: DC
Start: 2024-07-18 — End: 2024-07-22
  Administered 2024-07-18 – 2024-07-22 (×5): 180 mg via ORAL
  Filled 2024-07-17 (×5): qty 1

## 2024-07-17 MED ORDER — MORPHINE SULFATE (PF) 4 MG/ML IV SOLN
4.0000 mg | Freq: Once | INTRAVENOUS | Status: AC
  Administered 2024-07-17: 4 mg via INTRAVENOUS
  Filled 2024-07-17: qty 1

## 2024-07-17 MED ORDER — ESCITALOPRAM OXALATE 10 MG PO TABS
10.0000 mg | ORAL_TABLET | Freq: Every day | ORAL | Status: DC
  Administered 2024-07-18 – 2024-07-22 (×5): 10 mg via ORAL
  Filled 2024-07-17 (×5): qty 1

## 2024-07-17 MED ORDER — SODIUM CHLORIDE 0.9 % IV SOLN: INTRAVENOUS | Status: DC

## 2024-07-17 MED ORDER — SODIUM CHLORIDE 0.9 % IV SOLN
2.0000 g | INTRAVENOUS | Status: DC
  Administered 2024-07-18 – 2024-07-21 (×4): 2 g via INTRAVENOUS
  Filled 2024-07-17 (×5): qty 20

## 2024-07-17 MED ORDER — ENOXAPARIN SODIUM 30 MG/0.3ML IJ SOSY
30.0000 mg | PREFILLED_SYRINGE | INTRAMUSCULAR | Status: DC
  Administered 2024-07-17: 30 mg via SUBCUTANEOUS
  Filled 2024-07-17: qty 0.3

## 2024-07-17 MED ORDER — SODIUM CHLORIDE 0.9 % IV SOLN
2.0000 g | Freq: Once | INTRAVENOUS | Status: AC
  Administered 2024-07-17: 2 g via INTRAVENOUS
  Filled 2024-07-17: qty 20

## 2024-07-17 NOTE — Sepsis Progress Note (Signed)
 eLink is following this Code Sepsis.

## 2024-07-17 NOTE — ED Notes (Signed)
 CCMD called and notified

## 2024-07-17 NOTE — H&P (Addendum)
 History and Physical   Brandi Bryant FMW:[id number] DOB: [birth year] DOA: 07/17/2024  PCP: Delbert Clam, MD   Patient coming from: Home  Chief Complaint: Flank pain, fever  HPI: Brandi Bryant is a 68 y.o. female with medical history significant of hypertension, hyperlipidemia, diabetes, neuropathy, GERD, CAD, CVA, Bell's palsy, breast cancer, alcoholic fatty liver disease, cirrhosis presenting with flank pain and fever.  History obtained assistance of patient's family translating by patient's request/preference.  Patient reports 2 to 3 days of right flank pain with intermittent fever.  Also has had associated nausea and vomiting and decreased p.o. intake.  No reported urinary frequency nor dysuria but has had urinary urgency.   Some shivering/shaking with fevers.  Denies chest pain, shortness of breath, constipation, diarrhea.  ED Course: Vital signs in the ED notable for temperature of 99.2, blood pressure in the 100s-110 systolic.  Lab workup included CMP with sodium 131, potassium 3.3, bicarb 17, BUN 62, creatinine 1.88 from baseline of 0.9, glucose 216, protein 8.2, albumin 2.7, AST near baseline at 94, ALT stable at 53, T. bili 1.8.  CBC with platelets mildly reduced at baseline at 104.  Troponin pending.  Lipase normal.  Lactic acid normal.  Respiratory panel for flu COVID RSV negative.  Urinalysis with hemoglobin, protein, leukocytes, bacteria.  Urine culture and blood culture pending.  CT abdomen pelvis showed no acute normality but did show ill-defined hepatic lesion that was slightly enlarged from previous exam with recommendation for nonemergent MRI to follow-up/further characterize.  Patient received morphine , Zofran , IV fluids, ceftriaxone  in the ED.  Review of Systems: As per HPI otherwise all other systems reviewed and are negative.  Past Medical History:  Diagnosis Date   Abdominal pain    Allergy    Arthralgia 08/13/2013   Bell palsy 2006    Bell's palsy    Breast cancer (HCC) 2009   Left Breast Cancer   Cataract    Chest pain 09/30/2013   Cholelithiasis    Chronic sinusitis 05/16/2017   Colon cancer screening 10/27/2015   Complication of anesthesia     tubal ligation, in [place]  had weakness, couldnt stand up the next day, was given pills for oxygen for Brain.  1 month after I was having loss of attentiviness, still have to focus  carefully.    Coronary artery disease    CVA (cerebral vascular accident) (HCC) 03/30/2018   Depression    Diabetes mellitus    Type II   Dyspnea    at times- when air conditioner is runnimg, heat also    Encounter for screening colonoscopy 10/30/2015   GERD (gastroesophageal reflux disease)    Hepatic steatosis    History of breast cancer 06/01/2012   HTN (hypertension)    Hx of adenomatous polyp of colon 11/07/2019   Hyperlipidemia    Internal carotid artery stenosis 04/03/2018   Personal history of chemotherapy 2009   Left Breast Cancer   Personal history of radiation therapy 2009   Left Breast Cancer   Stroke [facility])    2006, 2015   TIA (transient ischemic attack) 02/20/2014    Past Surgical History:  Procedure Laterality Date   ANKLE ARTHROSCOPY Right 12/14/2016   Procedure: Right Ankle Arthroscopic Debridement;  Surgeon: Jerona Harden GAILS, MD;  Location: [facility] OR;  Service: Orthopedics;  Laterality: Right;   Arm surgery     Left tendon lengthen   BREAST LUMPECTOMY Left 2009   CHOLECYSTECTOMY N/A 12/09/2015   Procedure: LAPAROSCOPIC CHOLECYSTECTOMY WITH INTRAOPERATIVE CHOLANGIOGRAM;  Surgeon: Camellia Blush, MD;  Location: [facility] OR;  Service: General;  Laterality: N/A;   LEFT HEART CATHETERIZATION WITH CORONARY ANGIOGRAM N/A 10/30/2013   Procedure: LEFT HEART CATHETERIZATION WITH CORONARY ANGIOGRAM;  Surgeon: Maude JAYSON Emmer, MD;  Location: [facility] CATH LAB;  Service: Cardiovascular;  Laterality: N/A;   porta cath Right    Removal of Porta cath Right    TUBAL LIGATION      Social History  reports  that she has never smoked. She has never used smokeless tobacco. She reports that she does not drink alcohol and does not use drugs.  Allergies  Allergen Reactions   Gadolinium Derivatives Nausea And Vomiting    Code: VOM, Desc: Pt began vomiting 45 sec after MRI contrast injection of Multihance , Onset Date: [id number]     Iodinated Contrast Media Nausea And Vomiting    Family History  Problem Relation Age of Onset   Thyroid cancer Sister    Hypertension Mother    Diabetes Mother    Migraines Daughter    Colon cancer Neg Hx    Esophageal cancer Neg Hx    Stomach cancer Neg Hx    Rectal cancer Neg Hx   Reviewed on admission  Prior to Admission medications   Medication Sig Start Date End Date Taking? Authorizing Provider  aspirin  325 MG EC tablet Take 1 tablet (325 mg total) by mouth daily. 06/14/18  Yes Danton Jon HERO, PA-C  diltiazem  (CARDIZEM  CD) 180 MG 24 hr capsule Take 1 capsule (180 mg total) by mouth daily. 06/13/24  Yes Newlin, Enobong, MD  Dulaglutide  (TRULICITY ) 3 MG/0.5ML SOAJ Inject 3 mg as directed once a week. 06/13/24  Yes Newlin, Enobong, MD  escitalopram  (LEXAPRO ) 10 MG tablet Take 1 tablet (10 mg total) by mouth daily. 06/13/24  Yes Newlin, Enobong, MD  HYDROcodone -acetaminophen  (NORCO/VICODIN) 5-325 MG tablet Take 1 tablet by mouth every 6 (six) hours as needed for moderate pain (pain score 4-6). 03/06/24  Yes Zackowski, Scott, MD  lisinopril -hydrochlorothiazide  (ZESTORETIC ) 20-25 MG tablet Take 1 tablet by mouth daily. 06/13/24  Yes Newlin, Enobong, MD  rosuvastatin  (CRESTOR ) 10 MG tablet Take 1 tablet (10 mg total) by mouth daily. 06/13/24  Yes Newlin, Enobong, MD  traZODone  (DESYREL ) 100 MG tablet Take 1 tablet (100 mg total) by mouth at bedtime as needed for sleep. 06/13/24  Yes Newlin, Enobong, MD  glipiZIDE  (GLUCOTROL ) 5 MG tablet Take 1 tablet (5 mg total) by mouth 2 (two) times daily before a meal. Patient not taking: Reported on 07/17/2024 06/13/24   Delbert Clam, MD    Physical Exam: Vitals:   07/17/24 1110 07/17/24 1437 07/17/24 1515  BP: 109/65 110/62 (!) 102/58  Pulse: 92 83 77  Resp: 17 15 16   Temp: 99.2 F (37.3 C)    TempSrc: Oral    SpO2: 97% 100% 94%    Physical Exam Constitutional:      General: She is not in acute distress.    Appearance: Normal appearance.  HENT:     Head: Normocephalic and atraumatic.     Mouth/Throat:     Mouth: Mucous membranes are moist.     Pharynx: Oropharynx is clear.  Eyes:     Extraocular Movements: Extraocular movements intact.     Pupils: Pupils are equal, round, and reactive to light.  Cardiovascular:     Rate and Rhythm: Normal rate and regular rhythm.     Pulses: Normal pulses.     Heart sounds: Normal heart sounds.  Pulmonary:  Effort: Pulmonary effort is normal. No respiratory distress.     Breath sounds: Normal breath sounds.  Abdominal:     General: Bowel sounds are normal. There is no distension.     Palpations: Abdomen is soft.     Tenderness: There is abdominal tenderness.  Musculoskeletal:        General: No swelling or deformity.  Skin:    General: Skin is warm and dry.  Neurological:     General: No focal deficit present.     Mental Status: Mental status is at baseline.    Labs on Admission: I have personally reviewed following labs and imaging studies  CBC: Recent Labs  Lab 07/17/24 1120  WBC 9.3  HGB 14.6  HCT 41.1  MCV 86.5  PLT 109*    Basic Metabolic Panel: Recent Labs  Lab 07/17/24 1120  NA 131*  K 3.3*  CL 101  CO2 17*  GLUCOSE 216*  BUN 62*  CREATININE 1.88*  CALCIUM  8.2*    GFR: CrCl cannot be calculated (Unknown ideal weight.).  Liver Function Tests: Recent Labs  Lab 07/17/24 1120  AST 94*  ALT 53*  ALKPHOS 120  BILITOT 1.8*  PROT 7.0  ALBUMIN 2.7*    Urine analysis:    Component Value Date/Time   COLORURINE AMBER (A) 07/17/2024 1120   APPEARANCEUR CLOUDY (A) 07/17/2024 1120   LABSPEC 1.011 07/17/2024 1120    LABSPEC 1.015 10/27/2016 1106   PHURINE 5.0 07/17/2024 1120   GLUCOSEU 50 (A) 07/17/2024 1120   GLUCOSEU 500 10/27/2016 1106   HGBUR MODERATE (A) 07/17/2024 1120   BILIRUBINUR NEGATIVE 07/17/2024 1120   BILIRUBINUR negative 03/06/2024 1710   BILIRUBINUR Negative 10/27/2016 1106   KETONESUR NEGATIVE 07/17/2024 1120   PROTEINUR 100 (A) 07/17/2024 1120   UROBILINOGEN 0.2 03/06/2024 1710   UROBILINOGEN 0.2 01/08/2018 1602   UROBILINOGEN 0.2 10/27/2016 1106   NITRITE NEGATIVE 07/17/2024 1120   LEUKOCYTESUR MODERATE (A) 07/17/2024 1120   LEUKOCYTESUR Small 10/27/2016 1106    Radiological Exams on Admission: CT ABDOMEN PELVIS WO CONTRAST Result Date: 07/17/2024 CLINICAL DATA:  RLQ abdominal pain. EXAM: CT ABDOMEN AND PELVIS WITHOUT CONTRAST TECHNIQUE: Multidetector CT imaging of the abdomen and pelvis was performed following the standard protocol without IV contrast. RADIATION DOSE REDUCTION: This exam was performed according to the departmental dose-optimization program which includes automated exposure control, adjustment of the mA and/or kV according to patient size and/or use of iterative reconstruction technique. COMPARISON:  CT scan renal stone protocol from 03/06/2024. FINDINGS: Lower chest: There are several sub 4 mm solid noncalcified nodules in the visualized bilateral lungs (marked with electronic arrow sign on series 5). The nodule in the middle lobe (series 5, image 3), is new since the prior study. The other nodules in the left lung lower lobe are unchanged since the prior study from 05/30/2021 and favored benign. The lung bases are otherwise clear. No pleural effusion. The heart is normal in size. No pericardial effusion. Hepatobiliary: The liver is normal in size. There is liver surface irregularity/nodularity, compatible with cirrhosis. There is an ill-defined approximately 9 x 14 mm hypoattenuating lesion in the right hepatic lobe, segment 8, which is incompletely characterized on the  current exam and appears slightly enlarged since the prior study from 03/06/2024. This lesion was not distinctly seen on the prior CT scan from 05/30/2021. Further evaluation with nonemergent MRI abdomen as per hepatic mass protocol is recommended. No other focal liver lesion seen. No intrahepatic bile duct dilation. There is  mild prominence of the extrahepatic bile duct, most likely due to post cholecystectomy status. Gallbladder is surgically absent. Pancreas: Unremarkable. No pancreatic ductal dilatation or surrounding inflammatory changes. Spleen: Within normal limits. No focal lesion. Adrenals/Urinary Tract: Adrenal glands are unremarkable. No suspicious renal mass within the limitations of this unenhanced exam. No nephroureterolithiasis or obstructive uropathy on either side. There is a subcentimeter slightly hyperattenuating structure arising from the left kidney upper pole, anteriorly, incompletely characterized on the current exam but similar to the prior study and favored to represent proteinaceous/hemorrhagic cysts. Unremarkable urinary bladder. Stomach/Bowel: No disproportionate dilation of the small or large bowel loops. No evidence of abnormal bowel wall thickening or inflammatory changes. The appendix is unremarkable. There are multiple diverticula throughout the colon, without imaging signs of diverticulitis. Vascular/Lymphatic: No ascites or pneumoperitoneum. No abdominal or pelvic lymphadenopathy, by size criteria. No aneurysmal dilation of the major abdominal arteries. There are moderate peripheral atherosclerotic vascular calcifications of the aorta and its major branches. Reproductive: Not well evaluated on the CT scan exam. However, having said that, normal-size anteverted uterus noted exhibiting diffuse myometrial calcifications. No large adnexal mass seen. Other: There is a tiny fat containing umbilical hernia. The soft tissues and abdominal wall are otherwise unremarkable. Musculoskeletal:  No suspicious osseous lesions. There are mild multilevel degenerative changes in the visualized spine. IMPRESSION: 1. No acute inflammatory process identified within the abdomen or pelvis. Unremarkable appendix. No bowel obstruction. 2. No nephroureterolithiasis or obstructive uropathy on either side. 3. There is an ill-defined approximately 9 x 14 mm hypoattenuating lesion in the right hepatic lobe, segment 8, which is incompletely characterized on the current exam and appears slightly enlarged since the prior study from 03/06/2024. Further evaluation with nonemergent MRI abdomen as per hepatic mass protocol is recommended. 4. Multiple other nonacute observations, as described above. Aortic Atherosclerosis (ICD10-I70.0). Electronically Signed   By: Ree Molt M.D.   On: 07/17/2024 14:32    EKG: Independently reviewed.  Sinus rhythm at 88 bpm.  Nonspecific T wave changes.  Assessment/Plan Active Problems:   HTN (hypertension)   Diabetes mellitus (HCC)   History of breast cancer   Hyperlipidemia   CAD (coronary artery disease), native coronary artery   Bell's palsy   NAFLD (nonalcoholic fatty liver disease)   Diabetic polyneuropathy associated with type 2 diabetes mellitus (HCC)   GERD (gastroesophageal reflux disease)   Other cirrhosis of liver (HCC)   AKI Hypokalemia > Creatinine noted to be elevated to 1.88 from baseline 0.9.  Potassium 3.3. > In the setting of nausea vomiting and decreased p.o. intake. > 1 L in the ED. - Monitoring telemetry overnight - Continue with IV fluids - 20 mEq p.o. potassium - Check magnesium  - Trend renal function and electrolytes  UTI > Patient presenting with flank pain, fevers, urinary urgency. > Urinalysis with hemoglobin, platelet, leukocytes, bacteria.  Urine culture blood culture pending. > CT abdomen pelvis showed no acute abnormality beyond slightly enlarged hepatic lesion with recommendation for MRI follow-up - Continue ceftriaxone  as  started in the ED - Trend fever curve and WBC - Follow-up urine culture  Hypertension - Continue home diltiazem  - Hold lisinopril -hydrochlorothiazide  in the setting of AKI  Hyperlipidemia - Continue rosuvastatin   Diabetes - SSI  History of CVA - Continue home ASA, rosuvastatin   CAD - Continue home ASA, rosuvastatin   History of breast cancer - Noted  NAFLD Cirrhosis > AST mildly elevated from baseline, ALT stable.  Platelets mildly below baseline at 104.  Normal alk phos, T.  bili 1.8. - Trend labs  DVT prophylaxis: Lovenox  Code Status:   Full Family Communication:  Updated at bedside Disposition Plan:   Patient is from:  Home  Anticipated DC to:  Home  Anticipated DC date:  1 to 3 days  Anticipated DC barriers: None  Consults called:  None Admission status:  Observation, telemetry  Severity of Illness: The appropriate patient status for this patient is OBSERVATION. Observation status is judged to be reasonable and necessary in order to provide the required intensity of service to ensure the patient's safety. The patient's presenting symptoms, physical exam findings, and initial radiographic and laboratory data in the context of their medical condition is felt to place them at decreased risk for further clinical deterioration. Furthermore, it is anticipated that the patient will be medically stable for discharge from the hospital within 2 midnights of admission.    Marsa KATHEE Scurry MD [facility]  How to contact the [facility] Attending or Consulting provider 7A - 7P or covering provider during after hours 7P -7A, for this patient?   Check the care team in [facility] and look for a) attending/consulting [facility] provider listed and b) the [facility] team listed Log into [email or web address] and use [place]'s universal password to access. If you do not have the password, please contact the hospital operator. Locate the [facility] provider you are looking for under [facility] and page to a  number that you can be directly reached. If you still have difficulty reaching the provider, please page the [facility] (Director on Call) for the Hospitalists listed on amion for assistance.  07/17/2024, 3:24 PM

## 2024-07-17 NOTE — Sepsis Progress Note (Signed)
 Confirmed with the beside RN Bruno that blood cultures were drawn prior to giving the antibiotic.

## 2024-07-17 NOTE — ED Provider Notes (Signed)
 [place] EMERGENCY DEPARTMENT AT [facility] Provider Note   CSN: [id number] Arrival date & time: 07/17/24  1106     Patient presents with: Fever, Back Pain, and Headache   Brandi Bryant is a 67 y.o. female.   Pt is a 68 yo female with pmhx significant for HTN, Breast Cancer, DM2, CVA, and GERD.  Pt has had n/v and right sided flank pain for the past few days. Pt has also had intermittent fever.  Pt does speak Spanish, but daughter interprets.  Pt declined video interpreter.       Prior to Admission medications   Medication Sig Start Date End Date Taking? Authorizing Provider  aspirin  325 MG EC tablet Take 1 tablet (325 mg total) by mouth daily. 06/14/18  Yes Danton Jon HERO, PA-C  diltiazem  (CARDIZEM  CD) 180 MG 24 hr capsule Take 1 capsule (180 mg total) by mouth daily. 06/13/24  Yes Newlin, Enobong, MD  Dulaglutide  (TRULICITY ) 3 MG/0.5ML SOAJ Inject 3 mg as directed once a week. 06/13/24  Yes Newlin, Enobong, MD  escitalopram  (LEXAPRO ) 10 MG tablet Take 1 tablet (10 mg total) by mouth daily. 06/13/24  Yes Newlin, Enobong, MD  HYDROcodone -acetaminophen  (NORCO/VICODIN) 5-325 MG tablet Take 1 tablet by mouth every 6 (six) hours as needed for moderate pain (pain score 4-6). 03/06/24  Yes Zackowski, Scott, MD  lisinopril -hydrochlorothiazide  (ZESTORETIC ) 20-25 MG tablet Take 1 tablet by mouth daily. 06/13/24  Yes Newlin, Enobong, MD  rosuvastatin  (CRESTOR ) 10 MG tablet Take 1 tablet (10 mg total) by mouth daily. 06/13/24  Yes Newlin, Enobong, MD  traZODone  (DESYREL ) 100 MG tablet Take 1 tablet (100 mg total) by mouth at bedtime as needed for sleep. 06/13/24  Yes Newlin, Enobong, MD  glipiZIDE  (GLUCOTROL ) 5 MG tablet Take 1 tablet (5 mg total) by mouth 2 (two) times daily before a meal. Patient not taking: Reported on 07/17/2024 06/13/24   Newlin, Enobong, MD    Allergies: Gadolinium derivatives and Iodinated contrast media    Review of Systems  Constitutional:  Positive  for fever.  Musculoskeletal:  Positive for back pain.  Neurological:  Positive for headaches.  All other systems reviewed and are negative.   Updated Vital Signs BP (!) 102/58 (BP Location: Left Arm)   Pulse 77   Temp 99.2 F (37.3 C) (Oral)   Resp 16   SpO2 94%   Physical Exam Vitals and nursing note reviewed.  Constitutional:      Appearance: She is well-developed.  HENT:     Head: Normocephalic and atraumatic.     Mouth/Throat:     Mouth: Mucous membranes are moist.     Pharynx: Oropharynx is clear.  Eyes:     Extraocular Movements: Extraocular movements intact.     Pupils: Pupils are equal, round, and reactive to light.  Cardiovascular:     Rate and Rhythm: Normal rate and regular rhythm.     Heart sounds: Normal heart sounds.  Pulmonary:     Effort: Pulmonary effort is normal.     Breath sounds: Normal breath sounds.  Abdominal:     General: Bowel sounds are normal.     Palpations: Abdomen is soft.     Tenderness: There is abdominal tenderness in the right lower quadrant. There is right CVA tenderness.  Musculoskeletal:        General: Normal range of motion.     Cervical back: Normal range of motion and neck supple.  Skin:    General: Skin is warm.  Capillary Refill: Capillary refill takes less than 2 seconds.  Neurological:     Mental Status: She is alert and oriented to person, place, and time.  Psychiatric:        Mood and Affect: Mood normal.        Speech: Speech normal.        Behavior: Behavior normal.     (all labs ordered are listed, but only abnormal results are displayed) Labs Reviewed  COMPREHENSIVE METABOLIC PANEL WITH GFR - Abnormal; Notable for the following components:      Result Value   Sodium 131 (*)    Potassium 3.3 (*)    CO2 17 (*)    Glucose, Bld 216 (*)    BUN 62 (*)    Creatinine, Ser 1.88 (*)    Calcium  8.2 (*)    Albumin 2.7 (*)    AST 94 (*)    ALT 53 (*)    Total Bilirubin 1.8 (*)    GFR, Estimated 29 (*)    All  other components within normal limits  CBC - Abnormal; Notable for the following components:   Platelets 109 (*)    All other components within normal limits  URINALYSIS, ROUTINE W REFLEX MICROSCOPIC - Abnormal; Notable for the following components:   Color, Urine AMBER (*)    APPearance CLOUDY (*)    Glucose, UA 50 (*)    Hgb urine dipstick MODERATE (*)    Protein, ur 100 (*)    Leukocytes,Ua MODERATE (*)    Bacteria, UA MANY (*)    Non Squamous Epithelial 0-5 (*)    All other components within normal limits  RESP PANEL BY RT-PCR (RSV, FLU A&B, COVID)  RVPGX2  URINE CULTURE  CULTURE, BLOOD (ROUTINE X 2)  CULTURE, BLOOD (ROUTINE X 2)  LIPASE, BLOOD  HIV ANTIBODY (ROUTINE TESTING W REFLEX)  MAGNESIUM   I-STAT CG4 LACTIC ACID, ED  I-STAT CG4 LACTIC ACID, ED  TROPONIN I (HIGH SENSITIVITY)  TROPONIN I (HIGH SENSITIVITY)    EKG: None  Radiology: CT ABDOMEN PELVIS WO CONTRAST Result Date: 07/17/2024 CLINICAL DATA:  RLQ abdominal pain. EXAM: CT ABDOMEN AND PELVIS WITHOUT CONTRAST TECHNIQUE: Multidetector CT imaging of the abdomen and pelvis was performed following the standard protocol without IV contrast. RADIATION DOSE REDUCTION: This exam was performed according to the departmental dose-optimization program which includes automated exposure control, adjustment of the mA and/or kV according to patient size and/or use of iterative reconstruction technique. COMPARISON:  CT scan renal stone protocol from 03/06/2024. FINDINGS: Lower chest: There are several sub 4 mm solid noncalcified nodules in the visualized bilateral lungs (marked with electronic arrow sign on series 5). The nodule in the middle lobe (series 5, image 3), is new since the prior study. The other nodules in the left lung lower lobe are unchanged since the prior study from 05/30/2021 and favored benign. The lung bases are otherwise clear. No pleural effusion. The heart is normal in size. No pericardial effusion. Hepatobiliary:  The liver is normal in size. There is liver surface irregularity/nodularity, compatible with cirrhosis. There is an ill-defined approximately 9 x 14 mm hypoattenuating lesion in the right hepatic lobe, segment 8, which is incompletely characterized on the current exam and appears slightly enlarged since the prior study from 03/06/2024. This lesion was not distinctly seen on the prior CT scan from 05/30/2021. Further evaluation with nonemergent MRI abdomen as per hepatic mass protocol is recommended. No other focal liver lesion seen. No intrahepatic bile duct dilation.  There is mild prominence of the extrahepatic bile duct, most likely due to post cholecystectomy status. Gallbladder is surgically absent. Pancreas: Unremarkable. No pancreatic ductal dilatation or surrounding inflammatory changes. Spleen: Within normal limits. No focal lesion. Adrenals/Urinary Tract: Adrenal glands are unremarkable. No suspicious renal mass within the limitations of this unenhanced exam. No nephroureterolithiasis or obstructive uropathy on either side. There is a subcentimeter slightly hyperattenuating structure arising from the left kidney upper pole, anteriorly, incompletely characterized on the current exam but similar to the prior study and favored to represent proteinaceous/hemorrhagic cysts. Unremarkable urinary bladder. Stomach/Bowel: No disproportionate dilation of the small or large bowel loops. No evidence of abnormal bowel wall thickening or inflammatory changes. The appendix is unremarkable. There are multiple diverticula throughout the colon, without imaging signs of diverticulitis. Vascular/Lymphatic: No ascites or pneumoperitoneum. No abdominal or pelvic lymphadenopathy, by size criteria. No aneurysmal dilation of the major abdominal arteries. There are moderate peripheral atherosclerotic vascular calcifications of the aorta and its major branches. Reproductive: Not well evaluated on the CT scan exam. However, having  said that, normal-size anteverted uterus noted exhibiting diffuse myometrial calcifications. No large adnexal mass seen. Other: There is a tiny fat containing umbilical hernia. The soft tissues and abdominal wall are otherwise unremarkable. Musculoskeletal: No suspicious osseous lesions. There are mild multilevel degenerative changes in the visualized spine. IMPRESSION: 1. No acute inflammatory process identified within the abdomen or pelvis. Unremarkable appendix. No bowel obstruction. 2. No nephroureterolithiasis or obstructive uropathy on either side. 3. There is an ill-defined approximately 9 x 14 mm hypoattenuating lesion in the right hepatic lobe, segment 8, which is incompletely characterized on the current exam and appears slightly enlarged since the prior study from 03/06/2024. Further evaluation with nonemergent MRI abdomen as per hepatic mass protocol is recommended. 4. Multiple other nonacute observations, as described above. Aortic Atherosclerosis (ICD10-I70.0). Electronically Signed   By: Ree Molt M.D.   On: 07/17/2024 14:32     Procedures   Medications Ordered in the ED  aspirin  EC tablet 325 mg (has no administration in time range)  diltiazem  (CARDIZEM  CD) 24 hr capsule 180 mg (has no administration in time range)  rosuvastatin  (CRESTOR ) tablet 10 mg (has no administration in time range)  escitalopram  (LEXAPRO ) tablet 10 mg (has no administration in time range)  traZODone  (DESYREL ) tablet 100 mg (has no administration in time range)  enoxaparin  (LOVENOX ) injection 30 mg (has no administration in time range)  sodium chloride  flush (NS) 0.9 % injection 3 mL (has no administration in time range)  potassium chloride  SA (KLOR-CON  M) CR tablet 20 mEq (has no administration in time range)  0.9 %  sodium chloride  infusion (has no administration in time range)  acetaminophen  (TYLENOL ) tablet 650 mg (has no administration in time range)    Or  acetaminophen  (TYLENOL ) suppository 650 mg  (has no administration in time range)  polyethylene glycol (MIRALAX  / GLYCOLAX ) packet 17 g (has no administration in time range)  cefTRIAXone  (ROCEPHIN ) 2 g in sodium chloride  0.9 % 100 mL IVPB (has no administration in time range)  lactated ringers  infusion (has no administration in time range)  sodium chloride  0.9 % bolus 1,000 mL (has no administration in time range)  acetaminophen  (TYLENOL ) tablet 1,000 mg (has no administration in time range)  ondansetron  (ZOFRAN -ODT) disintegrating tablet 4 mg (4 mg Oral Given 07/17/24 1142)  sodium chloride  0.9 % bolus 1,000 mL (1,000 mLs Intravenous New Bag/Given 07/17/24 1436)  morphine  (PF) 4 MG/ML injection 4 mg (4 mg  Intravenous Given 07/17/24 1411)  cefTRIAXone  (ROCEPHIN ) 2 g in sodium chloride  0.9 % 100 mL IVPB (0 g Intravenous Stopped 07/17/24 1517)                                    Medical Decision Making Amount and/or Complexity of Data Reviewed Labs: ordered. Radiology: ordered.  Risk Prescription drug management. Decision regarding hospitalization.   This patient presents to the ED for concern of flank pain and n/v, this involves an extensive number of treatment options, and is a complaint that carries with it a high risk of complications and morbidity.  The differential diagnosis includes pyelo, uti, appy, gastroenteritis   Co morbidities that complicate the patient evaluation  HTN, Breast Cancer, DM2, CVA, and GERD   Additional history obtained:  Additional history obtained from epic chart review External records from outside source obtained and reviewed including daughter   Lab Tests:  I Ordered, and personally interpreted labs.  The pertinent results include:  cbc nl, cmp with bun 62 and cr 1.88 (bun 20 and cr 0.95 on 4/16); cbc with plt low at 109 (plt 147 in April); UA + uti; covid/flu/rsv neg   Imaging Studies ordered:  I ordered imaging studies including ct abd/pelvis  I independently visualized and interpreted  imaging which showed  No acute inflammatory process identified within the abdomen or  pelvis. Unremarkable appendix. No bowel obstruction.  2. No nephroureterolithiasis or obstructive uropathy on either side.  3. There is an ill-defined approximately 9 x 14 mm hypoattenuating  lesion in the right hepatic lobe, segment 8, which is incompletely  characterized on the current exam and appears slightly enlarged  since the prior study from 03/06/2024. Further evaluation with  nonemergent MRI abdomen as per hepatic mass protocol is recommended.  4. Multiple other nonacute observations, as described above.    Aortic Atherosclerosis (ICD10-I70.0).   I agree with the radiologist interpretation   Cardiac Monitoring:  The patient was maintained on a cardiac monitor.  I personally viewed and interpreted the cardiac monitored which showed an underlying rhythm of: nsr   Medicines ordered and prescription drug management:  I ordered medication including rocephin /ivfs/zofran   for sx  Reevaluation of the patient after these medicines showed that the patient improved I have reviewed the patients home medicines and have made adjustments as needed   Test Considered:  ct   Critical Interventions:  Ivfs/abx   Consultations Obtained:  I requested consultation with the hospitalist (Dr. Seena),  and discussed lab and imaging findings as well as pertinent plan -he will admit.   Problem List / ED Course:  Aki:  ivfs given UTI:  iv rocephin  given.  Bp continues to be soft, so a code sepsis called.   Reevaluation:  After the interventions noted above, I reevaluated the patient and found that they have :improved   Social Determinants of Health:  Spanish speaker.  Lives at home   Dispostion:  After consideration of the diagnostic results and the patients response to treatment, I feel that the patent would benefit from admission.    CRITICAL CARE Performed by: Mliss Boyers   Total critical care time: 30 minutes  Critical care time was exclusive of separately billable procedures and treating other patients.  Critical care was necessary to treat or prevent imminent or life-threatening deterioration.  Critical care was time spent personally by me on the following activities: development of treatment  plan with patient and/or surrogate as well as nursing, discussions with consultants, evaluation of patient's response to treatment, examination of patient, obtaining history from patient or surrogate, ordering and performing treatments and interventions, ordering and review of laboratory studies, ordering and review of radiographic studies, pulse oximetry and re-evaluation of patient's condition.      Final diagnoses:  AKI (acute kidney injury) (HCC)  Acute cystitis without hematuria  Sepsis with acute renal failure without septic shock, due to unspecified organism, unspecified acute renal failure type [facility])    ED Discharge Orders     None          Dean Clarity, MD 07/17/24 1535

## 2024-07-17 NOTE — ED Triage Notes (Signed)
 Pt endorses fever, headache and lower back pain for a week. Denies painful urination or pain anywhere else.

## 2024-07-17 NOTE — ED Notes (Signed)
 Patient transported to CT

## 2024-07-17 NOTE — Plan of Care (Signed)

## 2024-07-18 ENCOUNTER — Other Ambulatory Visit: Payer: Self-pay

## 2024-07-18 DIAGNOSIS — N390 Urinary tract infection, site not specified: Secondary | ICD-10-CM | POA: Diagnosis present

## 2024-07-18 LAB — BLOOD CULTURE ID PANEL (REFLEXED) - BCID2
A.calcoaceticus-baumannii: NOT DETECTED
Bacteroides fragilis: NOT DETECTED
CTX-M ESBL: NOT DETECTED
Candida albicans: NOT DETECTED
Candida auris: NOT DETECTED
Candida glabrata: NOT DETECTED
Candida krusei: NOT DETECTED
Candida parapsilosis: NOT DETECTED
Candida tropicalis: NOT DETECTED
Carbapenem resist OXA 48 LIKE: NOT DETECTED
Carbapenem resistance IMP: NOT DETECTED
Carbapenem resistance KPC: NOT DETECTED
Carbapenem resistance NDM: NOT DETECTED
Carbapenem resistance VIM: NOT DETECTED
Cryptococcus neoformans/gattii: NOT DETECTED
Enterobacter cloacae complex: NOT DETECTED
Enterobacterales: DETECTED — AB
Enterococcus Faecium: NOT DETECTED
Enterococcus faecalis: NOT DETECTED
Escherichia coli: DETECTED — AB
Haemophilus influenzae: NOT DETECTED
Klebsiella aerogenes: NOT DETECTED
Klebsiella oxytoca: NOT DETECTED
Klebsiella pneumoniae: NOT DETECTED
Listeria monocytogenes: NOT DETECTED
Neisseria meningitidis: NOT DETECTED
Proteus species: NOT DETECTED
Pseudomonas aeruginosa: NOT DETECTED
Salmonella species: NOT DETECTED
Serratia marcescens: NOT DETECTED
Staphylococcus aureus (BCID): NOT DETECTED
Staphylococcus epidermidis: NOT DETECTED
Staphylococcus lugdunensis: NOT DETECTED
Staphylococcus species: DETECTED — AB
Stenotrophomonas maltophilia: NOT DETECTED
Streptococcus agalactiae: NOT DETECTED
Streptococcus pneumoniae: NOT DETECTED
Streptococcus pyogenes: NOT DETECTED
Streptococcus species: NOT DETECTED

## 2024-07-18 LAB — COMPREHENSIVE METABOLIC PANEL WITH GFR
ALT: 48 U/L — ABNORMAL HIGH (ref 0–44)
AST: 92 U/L — ABNORMAL HIGH (ref 15–41)
Albumin: 2 g/dL — ABNORMAL LOW (ref 3.5–5.0)
Alkaline Phosphatase: 105 U/L (ref 38–126)
Anion gap: 10 (ref 5–15)
BUN: 41 mg/dL — ABNORMAL HIGH (ref 8–23)
CO2: 20 mmol/L — ABNORMAL LOW (ref 22–32)
Calcium: 7.7 mg/dL — ABNORMAL LOW (ref 8.9–10.3)
Chloride: 110 mmol/L (ref 98–111)
Creatinine, Ser: 1.2 mg/dL — ABNORMAL HIGH (ref 0.44–1.00)
GFR, Estimated: 50 mL/min — ABNORMAL LOW (ref >60)
Glucose, Bld: 158 mg/dL — ABNORMAL HIGH (ref 70–99)
Potassium: 3.4 mmol/L — ABNORMAL LOW (ref 3.5–5.1)
Sodium: 140 mmol/L (ref 135–145)
Total Bilirubin: 1.6 mg/dL — ABNORMAL HIGH (ref 0.0–1.2)
Total Protein: 5.4 g/dL — ABNORMAL LOW (ref 6.5–8.1)

## 2024-07-18 LAB — CBC
HCT: 33.9 % — ABNORMAL LOW (ref 36.0–46.0)
Hemoglobin: 12 g/dL (ref 12.0–15.0)
MCH: 30.8 pg (ref 26.0–34.0)
MCHC: 35.4 g/dL (ref 30.0–36.0)
MCV: 87.1 fL (ref 80.0–100.0)
Platelets: 92 K/uL — ABNORMAL LOW (ref 150–400)
RBC: 3.89 MIL/uL (ref 3.87–5.11)
RDW: 13.6 % (ref 11.5–15.5)
WBC: 6.4 K/uL (ref 4.0–10.5)
nRBC: 0 % (ref 0.0–0.2)

## 2024-07-18 LAB — TROPONIN I (HIGH SENSITIVITY)
Troponin I (High Sensitivity): 10 ng/L (ref <18)
Troponin I (High Sensitivity): 10 ng/L (ref <18)

## 2024-07-18 LAB — GLUCOSE, CAPILLARY
Glucose-Capillary: 141 mg/dL — ABNORMAL HIGH (ref 70–99)
Glucose-Capillary: 149 mg/dL — ABNORMAL HIGH (ref 70–99)
Glucose-Capillary: 172 mg/dL — ABNORMAL HIGH (ref 70–99)
Glucose-Capillary: 129 mg/dL — ABNORMAL HIGH (ref 70–99)

## 2024-07-18 LAB — HEPATITIS PANEL, ACUTE
HCV Ab: NONREACTIVE
Hep A IgM: NONREACTIVE
Hep B C IgM: NONREACTIVE
Hepatitis B Surface Ag: NONREACTIVE

## 2024-07-18 MED ORDER — OXYCODONE HCL 5 MG PO TABS
5.0000 mg | ORAL_TABLET | Freq: Once | ORAL | Status: AC
  Administered 2024-07-18: 5 mg via ORAL
  Filled 2024-07-18: qty 1

## 2024-07-18 MED ORDER — ENOXAPARIN SODIUM 40 MG/0.4ML IJ SOSY
40.0000 mg | PREFILLED_SYRINGE | INTRAMUSCULAR | Status: DC
  Administered 2024-07-18: 40 mg via SUBCUTANEOUS
  Filled 2024-07-18: qty 0.4

## 2024-07-18 MED ORDER — ONDANSETRON HCL 4 MG/2ML IJ SOLN
4.0000 mg | Freq: Once | INTRAMUSCULAR | Status: AC
  Administered 2024-07-18: 4 mg via INTRAVENOUS
  Filled 2024-07-18: qty 2

## 2024-07-18 MED ORDER — POTASSIUM CHLORIDE CRYS ER 20 MEQ PO TBCR
40.0000 meq | EXTENDED_RELEASE_TABLET | Freq: Two times a day (BID) | ORAL | Status: AC
  Administered 2024-07-18 (×2): 40 meq via ORAL
  Filled 2024-07-18 (×2): qty 2

## 2024-07-18 MED ORDER — ONDANSETRON HCL 4 MG/2ML IJ SOLN
4.0000 mg | Freq: Four times a day (QID) | INTRAMUSCULAR | Status: DC | PRN
  Administered 2024-07-18: 4 mg via INTRAVENOUS
  Filled 2024-07-18: qty 2

## 2024-07-18 NOTE — Plan of Care (Signed)

## 2024-07-18 NOTE — Progress Notes (Signed)
 Transition of Care [facility]) - Inpatient Brief Assessment   Patient Details  Name: Brandi Bryant MRN: [id number] Date of Birth: Mar 03, 1956  Transition of Care [facility]) CM/SW Contact:    Rosaline JONELLE Joe, RN Phone Number: 07/18/2024, 11:15 AM   Clinical Narrative: Patient admitted from home with flank pain and fever. Patient has cirrhosis and lesion of Hepatic lobe.  No IP Care management needs at this time.  Patient may need MATCH depending on discharge medications.  Meds would best be sent through [facility] pharmacy for Medication assistance since patient has no insurance.   Transition of Care Asessment: Insurance and Status: (P) Insurance coverage has been reviewed Patient has primary care physician: (P) Yes Home environment has been reviewed: (P) from  home with family Prior level of function:: (P) Independent Prior/Current Home Services: (P) No current home services Social Drivers of Health Review: (P) SDOH reviewed no interventions necessary Readmission risk has been reviewed: (P) Yes Transition of care needs: (P) no transition of care needs at this time

## 2024-07-18 NOTE — Progress Notes (Signed)
 PROGRESS NOTE Brandi Bryant    DOB: 1956/04/14, 68 y.o.  FMW:[id number]    Code Status: Full Code   DOA: 07/17/2024   LOS: 0  Brief hospital course  Brandi Bryant is a 68 y.o. female with a PMH significant for hypertension, hyperlipidemia, diabetes, neuropathy, GERD, CAD, CVA, Bell's palsy, breast cancer, alcoholic fatty liver disease, cirrhosis presenting with flank pain and fever.   ED Course: Vital signs in the ED notable for temperature of 99.2, blood pressure in the 100s-110 systolic. Lab workup included CMP with sodium 131, potassium 3.3, bicarb 17, BUN 62, creatinine 1.88 from baseline of 0.9, glucose 216, protein 8.2, albumin 2.7, AST near baseline at 94, ALT stable at 53, T. bili 1.8.  CBC with platelets mildly reduced at baseline at 104.  Lipase normal.  Lactic acid normal.  Respiratory panel for flu COVID RSV negative.  Urinalysis with hemoglobin, protein, leukocytes, bacteria.  Urine culture and blood culture pending.   CT abdomen pelvis showed no acute normality but did show ill-defined hepatic lesion that was slightly enlarged from previous exam with recommendation for nonemergent MRI to follow-up/further characterize.   Patient received morphine , Zofran , IV fluids, ceftriaxone  in the ED.  07/18/24 -continues to have flank pain, nausea. Unable to tolerate PO intake. BxCx results and final UxCx pending  Assessment & Plan  Principal Problem:   AKI (acute kidney injury) (HCC) Active Problems:   HTN (hypertension)   Diabetes mellitus (HCC)   History of breast cancer   Hyperlipidemia   CAD (coronary artery disease), native coronary artery   Bell's palsy   UTI (urinary tract infection)   NAFLD (nonalcoholic fatty liver disease)   Diabetic polyneuropathy associated with type 2 diabetes mellitus (HCC)   GERD (gastroesophageal reflux disease)   Other cirrhosis of liver (HCC)  AKI- improved. Cr 1.88>1.20 Hypokalemia- improved to 3.4 s/p replacement - continue  to monitor and replete PRN   UTI Blood cultures positive GN rods and cocci. Further identification pending > Patient presenting with flank pain, fevers, urinary urgency. > Urinalysis with hemoglobin, platelet, leukocytes, bacteria.  Urine culture blood culture pending. > CT abdomen pelvis showed no acute abnormality beyond slightly enlarged hepatic lesion with recommendation for MRI follow-up - Continue ceftriaxone  - Trend fever curve and WBC - Follow-up urine culture  N/V- suspect viral gastroenteritis vs result from bacteremia.  - supportive care PRN - zofran  IV - encourage PO intake   Hypertension - Continue home diltiazem  - Hold lisinopril -hydrochlorothiazide  in the setting of AKI   Hyperlipidemia - Continue rosuvastatin    Diabetes - SSI   History of CVA - Continue home ASA, rosuvastatin    CAD - Continue home ASA, rosuvastatin    History of breast cancer - Noted   NAFLD Cirrhosis > AST mildly elevated from baseline, ALT stable.  Platelets mildly below baseline at 104.  Normal alk phos, T. bili 1.8. - Trend labs  There is no height or weight on file to calculate BMI.  VTE ppx: enoxaparin  (LOVENOX ) injection 40 mg Start: 07/18/24 1700  Diet:     Diet   Diet regular Room service appropriate? Yes; Fluid consistency: Thin   Consultants: None   Subjective 07/18/24    Pt reports continued nausea. Tolerated small amount of liquids today. Has continued abdominal pain and lower back pain   Objective  Blood pressure (!) 132/58, pulse 84, temperature 98.4 F (36.9 C), resp. rate 20, SpO2 100%.  Intake/Output Summary (Last 24 hours) at 07/18/2024 0825 Last data filed at 07/17/2024 1814  Gross per 24 hour  Intake 2605.17 ml  Output --  Net 2605.17 ml   There were no vitals filed for this visit.   Physical Exam:  General: awake, alert, in mild distress  HEENT: atraumatic, clear conjunctiva, anicteric sclera, MMM, hearing grossly normal Respiratory: normal  respiratory effort. Cardiovascular: quick capillary refill, normal S1/S2, RRR, no JVD, murmurs Gastrointestinal: soft, mildly tender diffusely without guarding or rebound Nervous: A&O x3. no gross focal neurologic deficits, normal speech Extremities: moves all equally, no edema, normal tone Skin: dry, intact, normal temperature, normal color. No rashes, lesions or ulcers on exposed skin Psychiatry: normal mood, congruent affect  Labs   I have personally reviewed the following labs and imaging studies CBC    Component Value Date/Time   WBC 6.4 07/18/2024 0201   RBC 3.89 07/18/2024 0201   HGB 12.0 07/18/2024 0201   HGB 13.4 09/30/2013 1033   HCT 33.9 (L) 07/18/2024 0201   HCT 39.1 09/30/2013 1033   PLT 92 (L) 07/18/2024 0201   PLT 150 09/30/2013 1033   MCV 87.1 07/18/2024 0201   MCV 88.5 09/30/2013 1033   MCH 30.8 07/18/2024 0201   MCHC 35.4 07/18/2024 0201   RDW 13.6 07/18/2024 0201   RDW 12.8 09/30/2013 1033   LYMPHSABS 2.1 03/06/2024 2026   LYMPHSABS 2.1 09/30/2013 1033   MONOABS 0.7 03/06/2024 2026   MONOABS 0.4 09/30/2013 1033   EOSABS 0.6 (H) 03/06/2024 2026   EOSABS 0.2 09/30/2013 1033   BASOSABS 0.0 03/06/2024 2026   BASOSABS 0.0 09/30/2013 1033      Latest Ref Rng & Units 07/18/2024    2:01 AM 07/17/2024   11:20 AM 03/06/2024    8:26 PM  BMP  Glucose 70 - 99 mg/dL 841  783  99   BUN 8 - 23 mg/dL 41  62  20   Creatinine 0.44 - 1.00 mg/dL 8.79  8.11  9.04   Sodium 135 - 145 mmol/L 140  131  138   Potassium 3.5 - 5.1 mmol/L 3.4  3.3  4.2   Chloride 98 - 111 mmol/L 110  101  107   CO2 22 - 32 mmol/L 20  17  23    Calcium  8.9 - 10.3 mg/dL 7.7  8.2  8.9     CT ABDOMEN PELVIS WO CONTRAST Result Date: 07/17/2024 CLINICAL DATA:  RLQ abdominal pain. EXAM: CT ABDOMEN AND PELVIS WITHOUT CONTRAST TECHNIQUE: Multidetector CT imaging of the abdomen and pelvis was performed following the standard protocol without IV contrast. RADIATION DOSE REDUCTION: This exam was performed  according to the departmental dose-optimization program which includes automated exposure control, adjustment of the mA and/or kV according to patient size and/or use of iterative reconstruction technique. COMPARISON:  CT scan renal stone protocol from 03/06/2024. FINDINGS: Lower chest: There are several sub 4 mm solid noncalcified nodules in the visualized bilateral lungs (marked with electronic arrow sign on series 5). The nodule in the middle lobe (series 5, image 3), is new since the prior study. The other nodules in the left lung lower lobe are unchanged since the prior study from 05/30/2021 and favored benign. The lung bases are otherwise clear. No pleural effusion. The heart is normal in size. No pericardial effusion. Hepatobiliary: The liver is normal in size. There is liver surface irregularity/nodularity, compatible with cirrhosis. There is an ill-defined approximately 9 x 14 mm hypoattenuating lesion in the right hepatic lobe, segment 8, which is incompletely characterized on the current exam and appears slightly enlarged  since the prior study from 03/06/2024. This lesion was not distinctly seen on the prior CT scan from 05/30/2021. Further evaluation with nonemergent MRI abdomen as per hepatic mass protocol is recommended. No other focal liver lesion seen. No intrahepatic bile duct dilation. There is mild prominence of the extrahepatic bile duct, most likely due to post cholecystectomy status. Gallbladder is surgically absent. Pancreas: Unremarkable. No pancreatic ductal dilatation or surrounding inflammatory changes. Spleen: Within normal limits. No focal lesion. Adrenals/Urinary Tract: Adrenal glands are unremarkable. No suspicious renal mass within the limitations of this unenhanced exam. No nephroureterolithiasis or obstructive uropathy on either side. There is a subcentimeter slightly hyperattenuating structure arising from the left kidney upper pole, anteriorly, incompletely characterized on the  current exam but similar to the prior study and favored to represent proteinaceous/hemorrhagic cysts. Unremarkable urinary bladder. Stomach/Bowel: No disproportionate dilation of the small or large bowel loops. No evidence of abnormal bowel wall thickening or inflammatory changes. The appendix is unremarkable. There are multiple diverticula throughout the colon, without imaging signs of diverticulitis. Vascular/Lymphatic: No ascites or pneumoperitoneum. No abdominal or pelvic lymphadenopathy, by size criteria. No aneurysmal dilation of the major abdominal arteries. There are moderate peripheral atherosclerotic vascular calcifications of the aorta and its major branches. Reproductive: Not well evaluated on the CT scan exam. However, having said that, normal-size anteverted uterus noted exhibiting diffuse myometrial calcifications. No large adnexal mass seen. Other: There is a tiny fat containing umbilical hernia. The soft tissues and abdominal wall are otherwise unremarkable. Musculoskeletal: No suspicious osseous lesions. There are mild multilevel degenerative changes in the visualized spine. IMPRESSION: 1. No acute inflammatory process identified within the abdomen or pelvis. Unremarkable appendix. No bowel obstruction. 2. No nephroureterolithiasis or obstructive uropathy on either side. 3. There is an ill-defined approximately 9 x 14 mm hypoattenuating lesion in the right hepatic lobe, segment 8, which is incompletely characterized on the current exam and appears slightly enlarged since the prior study from 03/06/2024. Further evaluation with nonemergent MRI abdomen as per hepatic mass protocol is recommended. 4. Multiple other nonacute observations, as described above. Aortic Atherosclerosis (ICD10-I70.0). Electronically Signed   By: Ree Molt M.D.   On: 07/17/2024 14:32    Disposition Plan & Communication  Patient status: Observation  Admitted From: Home Planned disposition location: Home Anticipated  discharge date: TBD pending clinical improvement.   Family Communication: none    Author: Marien LITTIE Piety, DO [facility] 07/18/2024, 8:25 AM   Available by Epic secure chat 7AM-7PM. If 7PM-7AM, please contact night-coverage.  [facility] contact information found on [email or web address].

## 2024-07-18 NOTE — Progress Notes (Signed)
 PHARMACY - PHYSICIAN COMMUNICATION CRITICAL VALUE ALERT - BLOOD CULTURE IDENTIFICATION (BCID)  Brandi Bryant is an 68 y.o. female who presented to [facility] on 07/17/2024 with a chief complaint of flank pain, fever.   Assessment:  Pt p/w flank pain and fever, found to have lesion of hepatic lobe and hypoattenuating structure of L kidney upper pole.  1/4 bottles GPCs/GNRs- identified as E coli and staph spp on BCID   Name of physician (or Provider) Contacted: Dr. Lenon   Current antibiotics: Ceftriaxone  2g IV Q24H   Changes to prescribed antibiotics recommended:  Patient is on recommended antibiotics - No changes needed  Results for orders placed or performed during the hospital encounter of 07/17/24  Blood Culture ID Panel (Reflexed) (Collected: 07/17/2024  2:38 PM)  Result Value Ref Range   Enterococcus faecalis NOT DETECTED NOT DETECTED   Enterococcus Faecium NOT DETECTED NOT DETECTED   Listeria monocytogenes NOT DETECTED NOT DETECTED   Staphylococcus species DETECTED (A) NOT DETECTED   Staphylococcus aureus (BCID) NOT DETECTED NOT DETECTED   Staphylococcus epidermidis NOT DETECTED NOT DETECTED   Staphylococcus lugdunensis NOT DETECTED NOT DETECTED   Streptococcus species NOT DETECTED NOT DETECTED   Streptococcus agalactiae NOT DETECTED NOT DETECTED   Streptococcus pneumoniae NOT DETECTED NOT DETECTED   Streptococcus pyogenes NOT DETECTED NOT DETECTED   A.calcoaceticus-baumannii NOT DETECTED NOT DETECTED   Bacteroides fragilis NOT DETECTED NOT DETECTED   Enterobacterales DETECTED (A) NOT DETECTED   Enterobacter cloacae complex NOT DETECTED NOT DETECTED   Escherichia coli DETECTED (A) NOT DETECTED   Klebsiella aerogenes NOT DETECTED NOT DETECTED   Klebsiella oxytoca NOT DETECTED NOT DETECTED   Klebsiella pneumoniae NOT DETECTED NOT DETECTED   Proteus species NOT DETECTED NOT DETECTED   Salmonella species NOT DETECTED NOT DETECTED   Serratia marcescens NOT DETECTED  NOT DETECTED   Haemophilus influenzae NOT DETECTED NOT DETECTED   Neisseria meningitidis NOT DETECTED NOT DETECTED   Pseudomonas aeruginosa NOT DETECTED NOT DETECTED   Stenotrophomonas maltophilia NOT DETECTED NOT DETECTED   Candida albicans NOT DETECTED NOT DETECTED   Candida auris NOT DETECTED NOT DETECTED   Candida glabrata NOT DETECTED NOT DETECTED   Candida krusei NOT DETECTED NOT DETECTED   Candida parapsilosis NOT DETECTED NOT DETECTED   Candida tropicalis NOT DETECTED NOT DETECTED   Cryptococcus neoformans/gattii NOT DETECTED NOT DETECTED   CTX-M ESBL NOT DETECTED NOT DETECTED   Carbapenem resistance IMP NOT DETECTED NOT DETECTED   Carbapenem resistance KPC NOT DETECTED NOT DETECTED   Carbapenem resistance NDM NOT DETECTED NOT DETECTED   Carbapenem resist OXA 48 LIKE NOT DETECTED NOT DETECTED   Carbapenem resistance VIM NOT DETECTED NOT DETECTED   Massie Fila, PharmD Clinical Pharmacist  07/18/2024 8:12 AM

## 2024-07-19 ENCOUNTER — Inpatient Hospital Stay (HOSPITAL_COMMUNITY): Payer: MEDICAID

## 2024-07-19 LAB — GLUCOSE, CAPILLARY
Glucose-Capillary: 163 mg/dL — ABNORMAL HIGH (ref 70–99)
Glucose-Capillary: 169 mg/dL — ABNORMAL HIGH (ref 70–99)
Glucose-Capillary: 130 mg/dL — ABNORMAL HIGH (ref 70–99)
Glucose-Capillary: 189 mg/dL — ABNORMAL HIGH (ref 70–99)
Glucose-Capillary: 129 mg/dL — ABNORMAL HIGH (ref 70–99)

## 2024-07-19 LAB — CBC
HCT: 34.5 % — ABNORMAL LOW (ref 36.0–46.0)
Hemoglobin: 12.3 g/dL (ref 12.0–15.0)
MCH: 30.8 pg (ref 26.0–34.0)
MCHC: 35.7 g/dL (ref 30.0–36.0)
MCV: 86.5 fL (ref 80.0–100.0)
Platelets: 94 K/uL — ABNORMAL LOW (ref 150–400)
RBC: 3.99 MIL/uL (ref 3.87–5.11)
RDW: 13.6 % (ref 11.5–15.5)
WBC: 7 K/uL (ref 4.0–10.5)
nRBC: 0 % (ref 0.0–0.2)

## 2024-07-19 LAB — BASIC METABOLIC PANEL WITH GFR
Anion gap: 10 (ref 5–15)
BUN: 20 mg/dL (ref 8–23)
CO2: 21 mmol/L — ABNORMAL LOW (ref 22–32)
Calcium: 8.1 mg/dL — ABNORMAL LOW (ref 8.9–10.3)
Chloride: 109 mmol/L (ref 98–111)
Creatinine, Ser: 0.99 mg/dL (ref 0.44–1.00)
GFR, Estimated: >60 mL/min (ref >60)
Glucose, Bld: 167 mg/dL — ABNORMAL HIGH (ref 70–99)
Potassium: 4 mmol/L (ref 3.5–5.1)
Sodium: 140 mmol/L (ref 135–145)

## 2024-07-19 LAB — HEPATITIS PANEL, ACUTE
HCV Ab: NONREACTIVE
Hep A IgM: NONREACTIVE
Hep B C IgM: NONREACTIVE
Hepatitis B Surface Ag: NONREACTIVE

## 2024-07-19 MED ORDER — HYDROCODONE-ACETAMINOPHEN 5-325 MG PO TABS
1.0000 | ORAL_TABLET | ORAL | Status: DC | PRN
  Administered 2024-07-19 – 2024-07-22 (×4): 1 via ORAL
  Filled 2024-07-19 (×5): qty 1

## 2024-07-19 MED ORDER — SODIUM CHLORIDE 0.9 % IV SOLN: INTRAVENOUS | Status: AC

## 2024-07-19 MED ORDER — FLORANEX PO PACK
1.0000 g | PACK | Freq: Three times a day (TID) | ORAL | Status: DC
  Administered 2024-07-19 – 2024-07-22 (×10): 1 g via ORAL
  Filled 2024-07-19 (×11): qty 1

## 2024-07-19 MED ORDER — ROSUVASTATIN CALCIUM 5 MG PO TABS: 10.0000 mg | ORAL_TABLET | Freq: Every day | ORAL | Status: DC

## 2024-07-19 MED ORDER — GADOBUTROL 1 MMOL/ML IV SOLN
10.0000 mL | Freq: Once | INTRAVENOUS | Status: AC | PRN
  Administered 2024-07-19: 10 mL via INTRAVENOUS

## 2024-07-19 MED ORDER — HYDROMORPHONE HCL 1 MG/ML IJ SOLN: 0.5000 mg | INTRAMUSCULAR | Status: DC | PRN

## 2024-07-19 NOTE — Plan of Care (Signed)

## 2024-07-19 NOTE — Progress Notes (Signed)
 PROGRESS NOTE Brandi Bryant    DOB: 25-Feb-1956, 68 y.o.  FMW:[id number]    Code Status: Full Code   DOA: 07/17/2024   LOS: 1     Subjective 07/19/24    The patient was seen and examined this morning, afebrile, normotensive still complaining about right lower abdominal pain 8 out of 10.  Gets worse with movement    Discussed current findings imaging and blood cultures to her daughter at bedside who interpreted the findings..   Brief hospital course  Brandi Bryant is a 68 y.o. female with a PMH significant for hypertension, hyperlipidemia, diabetes, neuropathy, GERD, CAD, CVA, Bell's palsy, breast cancer, alcoholic fatty liver disease, cirrhosis presenting with flank pain and fever.   ED Course: Vital signs in the ED notable for temperature of 99.2, blood pressure in the 100s-110 systolic. Lab workup included CMP with sodium 131, potassium 3.3, bicarb 17, BUN 62, creatinine 1.88 from baseline of 0.9, glucose 216, protein 8.2, albumin 2.7, AST near baseline at 94, ALT stable at 53, T. bili 1.8.  CBC with platelets mildly reduced at baseline at 104.  Lipase normal.  Lactic acid normal.  Respiratory panel for flu COVID RSV negative.  Urinalysis with hemoglobin, protein, leukocytes, bacteria.  Urine culture and blood culture pending.   CT abdomen pelvis showed no acute normality but did show ill-defined hepatic lesion that was slightly enlarged from previous exam with recommendation for nonemergent MRI to follow-up/further characterize.   Patient received morphine , Zofran , IV fluids, ceftriaxone  in the ED.  07/19/24 -continues to have flank pain, nausea. Unable to tolerate PO intake. BxCx results and final UxCx pending  Assessment & Plan  Principal Problem:   AKI (acute kidney injury) (HCC) Active Problems:   HTN (hypertension)   Diabetes mellitus (HCC)   History of breast cancer   Hyperlipidemia   CAD (coronary artery disease), native coronary artery   Bell's  palsy   UTI (urinary tract infection)   NAFLD (nonalcoholic fatty liver disease)   Diabetic polyneuropathy associated with type 2 diabetes mellitus (HCC)   GERD (gastroesophageal reflux disease)   Other cirrhosis of liver (HCC)   Urinary tract infection  AKI- improved. Cr 1.88>1.20 >> 0.99 Hypokalemia- improved s/p replacement  UTI / Blood cultures ++ Staph, E. coli.  - Patient is afebrile, normotensive, continue to have flank pain right lower quadrant pain -Sepsis ruled out -Repeat blood cultures 07/17/2024 >>>  -  POA:  Patient presenting with flank pain, fevers, urinary urgency. > Urinalysis with hemoglobin, platelet, leukocytes, bacteria.   > CT abdomen pelvis showed no acute abnormality beyond slightly enlarged hepatic lesion  -Follow-up MRI of abdomen pelvis: Cirrhosis and portal venous hypertension. Segment 7 liver lesion is not typical of hepatocellular carcinoma -follow-up imaging in future 3 to 6 months  - Continue IV Ceftriaxone  - Trend fever curve and WBC, repeat blood cultures - Urine cultures no growth to date  Nausea vomiting -Improved, tolerating p.o. - suspect viral gastroenteritis vs result from bacteremia.  - supportive care PRN - zofran  IV - encourage PO intake   NAFLD Cirrhosis > AST mildly elevated from baseline, ALT stable.  Platelets mildly below baseline at 104.  Normal alk phos, T. bili 1.8. - Trend labs - Holding statin medications - Checking hepatitis panel   Active medical problems:     Hypertension - Continue home diltiazem  - Hold lisinopril -hydrochlorothiazide  in the setting of AKI   Hyperlipidemia - Continue rosuvastatin    Diabetes - Checking CBG q. ACHS, insulin  coverage  History of CVA - Continue home ASA, rosuvastatin  (holding due to transaminitis)   CAD - Continue home ASA, rosuvastatin  (holding due to transaminitis)   History of breast cancer - Noted     Body mass index is 30.61 kg/m.  VTE ppx:   SCDs   Diet:     Diet   Diet regular Room service appropriate? Yes; Fluid consistency: Thin   Consultants: None   Objective  Blood pressure (!) 132/58, pulse 84, temperature 98.4 F (36.9 C), resp. rate 20, SpO2 100%. No intake or output data in the 24 hours ending 07/19/24 1107  Filed Weights   07/18/24 0951  Weight: 71.1 kg         General:  AAO x 3,  cooperative, no distress;   HEENT:  Normocephalic, PERRL, otherwise with in Normal limits   Neuro:  CNII-XII intact. , normal motor and sensation, reflexes intact   Lungs:   Clear to auscultation BL, Respirations unlabored,  No wheezes / crackles  Cardio:    S1/S2, RRR, No murmure, No Rubs or Gallops   Abdomen:  Soft, non-tender, bowel sounds active all four quadrants, no guarding or peritoneal signs. ++ Pain/tenderness with palpation in right lower quadrant   Muscular  skeletal:  Limited exam -global generalized weaknesses - in bed, able to move all 4 extremities,   2+ pulses,  symmetric, No pitting edema  Skin:  Dry, warm to touch, negative for any Rashes,  Wounds: Please see nursing documentation          Labs   I have personally reviewed the following labs and imaging studies CBC    Component Value Date/Time   WBC 7.0 07/19/2024 0144   RBC 3.99 07/19/2024 0144   HGB 12.3 07/19/2024 0144   HGB 13.4 09/30/2013 1033   HCT 34.5 (L) 07/19/2024 0144   HCT 39.1 09/30/2013 1033   PLT 94 (L) 07/19/2024 0144   PLT 150 09/30/2013 1033   MCV 86.5 07/19/2024 0144   MCV 88.5 09/30/2013 1033   MCH 30.8 07/19/2024 0144   MCHC 35.7 07/19/2024 0144   RDW 13.6 07/19/2024 0144   RDW 12.8 09/30/2013 1033   LYMPHSABS 2.1 03/06/2024 2026   LYMPHSABS 2.1 09/30/2013 1033   MONOABS 0.7 03/06/2024 2026   MONOABS 0.4 09/30/2013 1033   EOSABS 0.6 (H) 03/06/2024 2026   EOSABS 0.2 09/30/2013 1033   BASOSABS 0.0 03/06/2024 2026   BASOSABS 0.0 09/30/2013 1033      Latest Ref Rng & Units 07/19/2024    1:44 AM 07/18/2024     2:01 AM 07/17/2024   11:20 AM  BMP  Glucose 70 - 99 mg/dL 832  841  783   BUN 8 - 23 mg/dL 20  41  62   Creatinine 0.44 - 1.00 mg/dL 9.00  8.79  8.11   Sodium 135 - 145 mmol/L 140  140  131   Potassium 3.5 - 5.1 mmol/L 4.0  3.4  3.3   Chloride 98 - 111 mmol/L 109  110  101   CO2 22 - 32 mmol/L 21  20  17    Calcium  8.9 - 10.3 mg/dL 8.1  7.7  8.2     MR LIVER W WO CONTRAST Result Date: 07/19/2024 CLINICAL DATA:  . IMPRESSION: 1. Mildly motion degraded exam. 2. Cirrhosis and portal venous hypertension. 3. Segment 7 liver lesion is not typical of hepatocellular carcinoma, given lack of arterial phase hyperenhancement. This remains indeterminate (especially given motion) and is considered LR 3,  intermediate probability for hepatocellular carcinoma. Recommend pre and post-contrast abdominal MRI follow-up in 6 months. 4.  Aortic Atherosclerosis (ICD10-I70.0). Electronically Signed   By: Rockey Kilts M.D.   On: 07/19/2024 10:26   US  Abdomen Limited RUQ (LIVER/GB) Result Date: 07/19/2024  IMPRESSION: 1. Cirrhotic Liver. Patent, hepatopetal portal vein flow. No discrete liver lesion by Ultrasound. 2. Prior cholecystectomy. No evidence of bile duct obstruction. Electronically Signed   By: VEAR Hurst M.D.   On: 07/19/2024 07:55   CT ABDOMEN PELVIS WO CONTRAST Result Date: 07/17/2024 CLINICAL DATA:  RLQ a IMPRESSION: 1. No acute inflammatory process identified within the abdomen or pelvis. Unremarkable appendix. No bowel obstruction. 2. No nephroureterolithiasis or obstructive uropathy on either side. 3. There is an ill-defined approximately 9 x 14 mm hypoattenuating lesion in the right hepatic lobe, segment 8, which is incompletely characterized on the current exam and appears slightly enlarged since the prior study from 03/06/2024. Further evaluation with nonemergent MRI abdomen as per hepatic mass protocol is recommended. 4. Multiple other nonacute observations, as described above. Aortic Atherosclerosis  (ICD10-I70.0). Electronically Signed   By: Ree Molt M.D.   On: 07/17/2024 14:32    Disposition Plan & Communication  Patient status: Inpatient  Admitted From: Home Planned disposition location: Home Anticipated discharge date: Discharge in next 24-48 hours pending improvement pain, follow-up blood cultures.   Family Communication: Daughter at bedside updated  Author: Adriana DELENA Grams, MD [facility] 07/19/2024, 11:07 AM   Available by Epic secure chat 7AM-7PM. If 7PM-7AM, please contact night-coverage.  [facility] contact information found on [email or web address].

## 2024-07-20 DIAGNOSIS — E876 Hypokalemia: Secondary | ICD-10-CM | POA: Insufficient documentation

## 2024-07-20 DIAGNOSIS — B9620 Unspecified Escherichia coli [E. coli] as the cause of diseases classified elsewhere: Secondary | ICD-10-CM | POA: Insufficient documentation

## 2024-07-20 DIAGNOSIS — K219 Gastro-esophageal reflux disease without esophagitis: Secondary | ICD-10-CM

## 2024-07-20 DIAGNOSIS — R7881 Bacteremia: Secondary | ICD-10-CM

## 2024-07-20 LAB — COMPREHENSIVE METABOLIC PANEL WITH GFR
ALT: 54 U/L — ABNORMAL HIGH (ref 0–44)
AST: 94 U/L — ABNORMAL HIGH (ref 15–41)
Albumin: 1.9 g/dL — ABNORMAL LOW (ref 3.5–5.0)
Alkaline Phosphatase: 155 U/L — ABNORMAL HIGH (ref 38–126)
Anion gap: 11 (ref 5–15)
BUN: 13 mg/dL (ref 8–23)
CO2: 23 mmol/L (ref 22–32)
Calcium: 8.1 mg/dL — ABNORMAL LOW (ref 8.9–10.3)
Chloride: 107 mmol/L (ref 98–111)
Creatinine, Ser: 0.75 mg/dL (ref 0.44–1.00)
GFR, Estimated: >60 mL/min (ref >60)
Glucose, Bld: 154 mg/dL — ABNORMAL HIGH (ref 70–99)
Potassium: 3.5 mmol/L (ref 3.5–5.1)
Sodium: 141 mmol/L (ref 135–145)
Total Bilirubin: 1.1 mg/dL (ref 0.0–1.2)
Total Protein: 5.5 g/dL — ABNORMAL LOW (ref 6.5–8.1)

## 2024-07-20 LAB — GLUCOSE, CAPILLARY
Glucose-Capillary: 158 mg/dL — ABNORMAL HIGH (ref 70–99)
Glucose-Capillary: 145 mg/dL — ABNORMAL HIGH (ref 70–99)
Glucose-Capillary: 181 mg/dL — ABNORMAL HIGH (ref 70–99)
Glucose-Capillary: 171 mg/dL — ABNORMAL HIGH (ref 70–99)

## 2024-07-20 LAB — TROPONIN I (HIGH SENSITIVITY)
Troponin I (High Sensitivity): 11 ng/L (ref <18)
Troponin I (High Sensitivity): 12 ng/L (ref <18)

## 2024-07-20 MED ORDER — ALUM & MAG HYDROXIDE-SIMETH 200-200-20 MG/5ML PO SUSP
15.0000 mL | ORAL | Status: DC | PRN
  Administered 2024-07-20: 15 mL via ORAL
  Filled 2024-07-20: qty 30

## 2024-07-20 MED ORDER — PANTOPRAZOLE SODIUM 40 MG IV SOLR
40.0000 mg | Freq: Every day | INTRAVENOUS | Status: DC
  Administered 2024-07-20 – 2024-07-21 (×2): 40 mg via INTRAVENOUS
  Filled 2024-07-20 (×2): qty 10

## 2024-07-20 NOTE — Progress Notes (Signed)
 Progress Note   Patient: Brandi Bryant FMW:[id number] DOB: Apr 04, 1956 DOA: 07/17/2024     2 DOS: the patient was seen and examined on 07/20/2024   Brief hospital course: Brandi Bryant is a 68 y.o. female with a PMH significant for hypertension, hyperlipidemia, diabetes, neuropathy, GERD, CAD, CVA, Bell's palsy, breast cancer, alcoholic fatty liver disease, cirrhosis presenting with flank pain and fever.  CT abdomen pelvis showed no acute normality but did show ill-defined hepatic lesion that was slightly enlarged from previous exam with recommendation for nonemergent MRI to follow-up/further characterize.   Patient received morphine , Zofran , IV fluids, ceftriaxone  in the ED.  Assessment and Plan: UTI  Bacteremia   Blood cultures ++ Staph, E. coli.  Patient is afebrile, normotensive, continue to have flank pain right lower quadrant pain Sepsis ruled out Repeat blood cultures ordered. POA:  Patient presenting with flank pain, fevers, urinary urgency. Urinalysis with hemoglobin, platelet, leukocytes, bacteria.   Urine cultures no growth to date. Blood culture sensitivities reviewed, staph epi contamination. Continue IV Ceftriaxone  Trend fever curve and WBC, repeat blood cultures   Nausea vomiting From viral gastroenteritis vs result from bacteremia.  Continue zofran  IV PRN. She did complain of epigastric pain, troponin, EKG, trial of Maalox ordered. Continue PPI daily Encourage PO intake, advance diet as tolerated. On and off epigastr  AKI- improved. Cr 1.88>1.20 >> 0.99.  Hypokalemia- improved s/p replacement   NAFLD Cirrhosis CT abdomen pelvis showed no acute abnormality beyond slightly enlarged hepatic lesion  MRI of abdomen pelvis: Cirrhosis and portal venous hypertension. Segment 7 liver lesion is not typical of hepatocellular carcinoma suggested follow-up imaging in future 3 to 6 months AST mildly elevated from baseline, ALT stable.  Platelets mildly  below baseline at 104.  Normal alk phos, T. bili 1.8. Trend lLFT Hold statin medications Hepatitis panel negative.   Obesity class I BMI 30.61 Diet, exercise and weight reduction advised.    Out of bed to chair. Incentive spirometry. Nursing supportive care. Fall, aspiration precautions. Diet:  Diet Orders (From admission, onward)     Start     Ordered   07/17/24 1700  Diet regular Room service appropriate? Yes; Fluid consistency: Thin  Diet effective now       Question Answer Comment  Room service appropriate? Yes   Fluid consistency: Thin      07/17/24 1530           DVT prophylaxis:   Level of care: Med-Surg   Code Status: Full Code  Subjective: Patient is seen and examined today morning. Does report nausea, headache, abdominal pain. On and off chest pain per son. She did not get out of bed, feels weak.  Physical Exam: Vitals:   07/20/24 0337 07/20/24 0745 07/20/24 1208 07/20/24 1628  BP: 131/68 (!) 124/57 (!) 125/56 132/63  Pulse: 62 65 65 61  Resp: 16     Temp: 98 F (36.7 C) 98 F (36.7 C)    TempSrc: Oral     SpO2: 97% 95% 92% 98%  Weight:      Height:        General - Elderly Hispanic female, distress due to pain, nausea HEENT - PERRLA, EOMI, atraumatic head, non tender sinuses. Lung - Clear, basal rales, rhonchi, wheezes. Heart - S1, S2 heard, no murmurs, rubs, trace pedal edema. Abdomen - Soft, lowe abdomen tender, bowel sounds good Neuro - Alert, awake and oriented x 3, non focal exam. Skin - Warm and dry.  Data Reviewed:  Latest Ref Rng & Units 07/19/2024    1:44 AM 07/18/2024    2:01 AM 07/17/2024   11:20 AM  CBC  WBC 4.0 - 10.5 K/uL 7.0  6.4  9.3   Hemoglobin 12.0 - 15.0 g/dL 87.6  87.9  85.3   Hematocrit 36.0 - 46.0 % 34.5  33.9  41.1   Platelets 150 - 400 K/uL 94  92  109       Latest Ref Rng & Units 07/20/2024    4:22 AM 07/19/2024    1:44 AM 07/18/2024    2:01 AM  BMP  Glucose 70 - 99 mg/dL 845  832  841   BUN 8 - 23  mg/dL 13  20  41   Creatinine 0.44 - 1.00 mg/dL 9.24  9.00  8.79   Sodium 135 - 145 mmol/L 141  140  140   Potassium 3.5 - 5.1 mmol/L 3.5  4.0  3.4   Chloride 98 - 111 mmol/L 107  109  110   CO2 22 - 32 mmol/L 23  21  20    Calcium  8.9 - 10.3 mg/dL 8.1  8.1  7.7    MR LIVER W WO CONTRAST Result Date: 07/19/2024 CLINICAL DATA:  Liver lesion and cirrhosis EXAM: MRI ABDOMEN WITHOUT AND WITH CONTRAST TECHNIQUE: Multiplanar multisequence MR imaging of the abdomen was performed both before and after the administration of intravenous contrast. CONTRAST:  10mL GADAVIST  GADOBUTROL  1 MMOL/ML IV SOLN COMPARISON:  CT of 07/17/2024.  Ultrasound 07/19/2024. FINDINGS: Portions of the exam are mildly motion degraded. This especially involves the early postcontrast images. Lower chest: Mild cardiomegaly. Hepatobiliary: Advanced cirrhosis. Corresponding to the CT abnormality, within segment 7, is a 8 mm focus of T1 hypointensity in 24/10. Not visualized on other precontrast series. No arterial phase hyperenhancement, given motion degradation. Portal venous phase and delayed hypoenhancement. Cholecystectomy, without biliary ductal dilatation. Pancreas: Pancreatic atrophy. No duct dilatation or acute inflammation. Spleen:  Normal in size, without focal abnormality. Adrenals/Urinary Tract:  Normal kidneys, without hydronephrosis. Stomach/Bowel: Normal stomach and abdominal bowel loops. Vascular/Lymphatic: Aortic atherosclerosis. Portal venous hypertension, with left upper quadrant portosystemic collaterals. No retroperitoneal or retrocrural adenopathy. Other:  No ascites. Musculoskeletal: No acute osseous abnormality. IMPRESSION: 1. Mildly motion degraded exam. 2. Cirrhosis and portal venous hypertension. 3. Segment 7 liver lesion is not typical of hepatocellular carcinoma, given lack of arterial phase hyperenhancement. This remains indeterminate (especially given motion) and is considered LR 3, intermediate probability for  hepatocellular carcinoma. Recommend pre and post-contrast abdominal MRI follow-up in 6 months. 4.  Aortic Atherosclerosis (ICD10-I70.0). Electronically Signed   By: Rockey Kilts M.D.   On: 07/19/2024 10:26   US  Abdomen Limited RUQ (LIVER/GB) Result Date: 07/19/2024 CLINICAL DATA:  68 year old female with right upper quadrant abdominal pain. EXAM: ULTRASOUND ABDOMEN LIMITED RIGHT UPPER QUADRANT COMPARISON:  Noncontrast CT Abdomen and Pelvis 07/17/2024. FINDINGS: Gallbladder: Surgically absent. Common bile duct: Diameter: 6 mm, within normal limits. Liver: Nodular, cirrhotic liver. Heterogeneous liver echogenicity. No discrete liver lesion by ultrasound. Portal vein is patent on color Doppler imaging with normal direction of blood flow towards the liver. Other: Negative visible right kidney.  No free fluid. IMPRESSION: 1. Cirrhotic Liver. Patent, hepatopetal portal vein flow. No discrete liver lesion by Ultrasound. 2. Prior cholecystectomy. No evidence of bile duct obstruction. Electronically Signed   By: VEAR Hurst M.D.   On: 07/19/2024 07:55    Family Communication: Discussed with patient, son over phone, understand and agree. All questions answered.  Disposition: Status is: Inpatient Remains inpatient appropriate because: bacteremia, IV antibiotics.  Planned Discharge Destination: Home with Home Health     Time spent: 48 minutes  Author: Concepcion Riser, MD 07/20/2024 4:36 PM Secure chat 7am to 7pm For on call review [email or web address].

## 2024-07-20 NOTE — Plan of Care (Signed)

## 2024-07-20 NOTE — Plan of Care (Signed)

## 2024-07-21 LAB — CULTURE, BLOOD (ROUTINE X 2)

## 2024-07-21 LAB — COMPREHENSIVE METABOLIC PANEL WITH GFR
ALT: 50 U/L — ABNORMAL HIGH (ref 0–44)
AST: 89 U/L — ABNORMAL HIGH (ref 15–41)
Albumin: 1.9 g/dL — ABNORMAL LOW (ref 3.5–5.0)
Alkaline Phosphatase: 184 U/L — ABNORMAL HIGH (ref 38–126)
Anion gap: 9 (ref 5–15)
BUN: 11 mg/dL (ref 8–23)
CO2: 24 mmol/L (ref 22–32)
Calcium: 8.2 mg/dL — ABNORMAL LOW (ref 8.9–10.3)
Chloride: 106 mmol/L (ref 98–111)
Creatinine, Ser: 0.75 mg/dL (ref 0.44–1.00)
GFR, Estimated: >60 mL/min (ref >60)
Glucose, Bld: 161 mg/dL — ABNORMAL HIGH (ref 70–99)
Potassium: 3.8 mmol/L (ref 3.5–5.1)
Sodium: 139 mmol/L (ref 135–145)
Total Bilirubin: 1.1 mg/dL (ref 0.0–1.2)
Total Protein: 5.8 g/dL — ABNORMAL LOW (ref 6.5–8.1)

## 2024-07-21 LAB — GLUCOSE, CAPILLARY
Glucose-Capillary: 146 mg/dL — ABNORMAL HIGH (ref 70–99)
Glucose-Capillary: 160 mg/dL — ABNORMAL HIGH (ref 70–99)
Glucose-Capillary: 149 mg/dL — ABNORMAL HIGH (ref 70–99)

## 2024-07-21 NOTE — Plan of Care (Signed)

## 2024-07-21 NOTE — Plan of Care (Signed)

## 2024-07-21 NOTE — Progress Notes (Signed)
 Progress Note   Patient: Brandi Bryant FMW:[id number] DOB: April 29, 1956 DOA: 07/17/2024     3 DOS: the patient was seen and examined on 07/21/2024   Brief hospital course: Brandi Bryant is a 68 y.o. female with a PMH significant for hypertension, hyperlipidemia, diabetes, neuropathy, GERD, CAD, CVA, Bell's palsy, breast cancer, alcoholic fatty liver disease, cirrhosis presenting with flank pain and fever.  CT abdomen pelvis showed no acute normality but did show ill-defined hepatic lesion that was slightly enlarged from previous exam with recommendation for nonemergent MRI to follow-up/further characterize.   Patient received morphine , Zofran , IV fluids, ceftriaxone  in the ED.  Assessment and Plan: UTI  Bacteremia  Blood cultures ++ Staph, E. coli.  Patient is afebrile, normotensive, continue to have flank pain right lower quadrant pain Sepsis ruled out Repeat blood cultures ordered. POA:  Patient presenting with flank pain, fevers, urinary urgency. Urinalysis with hemoglobin, platelet, leukocytes, bacteria.   Urine cultures not sent. Blood culture sensitivities reviewed, staph epi contamination. Continue IV Ceftriaxone . Trend fever curve and WBC, repeat blood cultures 8/30 no growth.   Nausea vomiting From viral gastroenteritis vs result from bacteremia.  Continue zofran  IV PRN. She did complain of epigastric pain, troponin, EKG, trial of Maalox ordered. Continue PPI daily Encourage PO intake, advance diet as tolerated. On and off epigastr  AKI- improved. Cr 1.88>1.20 >> 0.99.  Hypokalemia- improved s/p replacement   NAFLD Cirrhosis CT abdomen pelvis showed no acute abnormality beyond slightly enlarged hepatic lesion  MRI of abdomen pelvis: Cirrhosis and portal venous hypertension. Segment 7 liver lesion is not typical of hepatocellular carcinoma suggested follow-up imaging in future 3 to 6 months. RUQ sono with liver cirrhosis. AST mildly elevated from  baseline, ALT stable.  Platelets 94 stable. Normal alk phos, T. bili 1.8. Hold statin medications Hepatitis panel negative.   Obesity class I BMI 30.61 Diet, exercise and weight reduction advised.    Out of bed to chair. Incentive spirometry. Nursing supportive care. Fall, aspiration precautions. Diet:  Diet Orders (From admission, onward)     Start     Ordered   07/17/24 1700  Diet regular Room service appropriate? Yes; Fluid consistency: Thin  Diet effective now       Question Answer Comment  Room service appropriate? Yes   Fluid consistency: Thin      07/17/24 1530           DVT prophylaxis:   Level of care: Med-Surg   Code Status: Full Code  Subjective: Patient is seen and examined today morning. Does report nausea, diffuse abdominal pain. No chest pain per son. Encourage oral diet.  Physical Exam: Vitals:   07/20/24 2344 07/21/24 0401 07/21/24 0749 07/21/24 1154  BP: 121/60 (!) 119/56 (!) 118/53 128/63  Pulse: (!) 59 61 60 60  Resp: 15 18    Temp: 98.3 F (36.8 C) 98.5 F (36.9 C) 98 F (36.7 C)   TempSrc: Oral  Oral   SpO2: 92% 100% 95% 95%  Weight:      Height:        General - Elderly Hispanic female, distress due to pain, nausea HEENT - PERRLA, EOMI, atraumatic head, non tender sinuses. Lung - Clear, basal rales, rhonchi, wheezes. Heart - S1, S2 heard, no murmurs, rubs, trace pedal edema. Abdomen - Soft, lowe abdomen tender, bowel sounds good Neuro - Alert, awake and oriented x 3, non focal exam. Skin - Warm and dry.  Data Reviewed:      Latest Ref Rng &  Units 07/19/2024    1:44 AM 07/18/2024    2:01 AM 07/17/2024   11:20 AM  CBC  WBC 4.0 - 10.5 K/uL 7.0  6.4  9.3   Hemoglobin 12.0 - 15.0 g/dL 87.6  87.9  85.3   Hematocrit 36.0 - 46.0 % 34.5  33.9  41.1   Platelets 150 - 400 K/uL 94  92  109       Latest Ref Rng & Units 07/21/2024    6:10 AM 07/20/2024    4:22 AM 07/19/2024    1:44 AM  BMP  Glucose 70 - 99 mg/dL 838  845  832   BUN 8  - 23 mg/dL 11  13  20    Creatinine 0.44 - 1.00 mg/dL 9.24  9.24  9.00   Sodium 135 - 145 mmol/L 139  141  140   Potassium 3.5 - 5.1 mmol/L 3.8  3.5  4.0   Chloride 98 - 111 mmol/L 106  107  109   CO2 22 - 32 mmol/L 24  23  21    Calcium  8.9 - 10.3 mg/dL 8.2  8.1  8.1    No results found.   Family Communication: Discussed with patient, son over phone, understand and agree. All questions answered.  Disposition: Status is: Inpatient Remains inpatient appropriate because: bacteremia, IV antibiotics.  Planned Discharge Destination: Home with Home Health     Time spent: 46 minutes  Author: Concepcion Riser, MD 07/21/2024 2:16 PM Secure chat 7am to 7pm For on call review [email or web address].

## 2024-07-22 DIAGNOSIS — E876 Hypokalemia: Secondary | ICD-10-CM

## 2024-07-22 DIAGNOSIS — E1165 Type 2 diabetes mellitus with hyperglycemia: Secondary | ICD-10-CM

## 2024-07-22 LAB — GLUCOSE, CAPILLARY
Glucose-Capillary: 158 mg/dL — ABNORMAL HIGH (ref 70–99)
Glucose-Capillary: 205 mg/dL — ABNORMAL HIGH (ref 70–99)

## 2024-07-22 LAB — COMPREHENSIVE METABOLIC PANEL WITH GFR
ALT: 45 U/L — ABNORMAL HIGH (ref 0–44)
AST: 77 U/L — ABNORMAL HIGH (ref 15–41)
Albumin: 1.9 g/dL — ABNORMAL LOW (ref 3.5–5.0)
Alkaline Phosphatase: 193 U/L — ABNORMAL HIGH (ref 38–126)
Anion gap: 10 (ref 5–15)
BUN: 8 mg/dL (ref 8–23)
CO2: 24 mmol/L (ref 22–32)
Calcium: 8 mg/dL — ABNORMAL LOW (ref 8.9–10.3)
Chloride: 104 mmol/L (ref 98–111)
Creatinine, Ser: 0.77 mg/dL (ref 0.44–1.00)
GFR, Estimated: >60 mL/min (ref >60)
Glucose, Bld: 166 mg/dL — ABNORMAL HIGH (ref 70–99)
Potassium: 3.5 mmol/L (ref 3.5–5.1)
Sodium: 138 mmol/L (ref 135–145)
Total Bilirubin: 1 mg/dL (ref 0.0–1.2)
Total Protein: 5.9 g/dL — ABNORMAL LOW (ref 6.5–8.1)

## 2024-07-22 LAB — CULTURE, BLOOD (ROUTINE X 2): Culture: NO GROWTH

## 2024-07-22 MED ORDER — PANTOPRAZOLE SODIUM 40 MG PO TBEC: 40.0000 mg | DELAYED_RELEASE_TABLET | Freq: Every day | ORAL | Status: DC

## 2024-07-22 MED ORDER — CEFADROXIL 500 MG PO CAPS
1000.0000 mg | ORAL_CAPSULE | Freq: Two times a day (BID) | ORAL | Status: DC
  Administered 2024-07-22: 1000 mg via ORAL
  Filled 2024-07-22: qty 2

## 2024-07-22 MED ORDER — HYDROCODONE-ACETAMINOPHEN 5-325 MG PO TABS: 1.0000 | ORAL_TABLET | Freq: Four times a day (QID) | ORAL | 0 refills | Status: AC | PRN

## 2024-07-22 MED ORDER — CEFADROXIL 500 MG PO CAPS: 1000.0000 mg | ORAL_CAPSULE | Freq: Two times a day (BID) | ORAL | 0 refills | Status: AC

## 2024-07-22 NOTE — Plan of Care (Signed)

## 2024-07-22 NOTE — Progress Notes (Signed)
 DISCHARGE NOTE HOME Brandi Bryant to be discharged Home per MD order. Discussed prescriptions and follow up appointments with the patient. Prescriptions given to patient; medication list explained in detail. Patient verbalized understanding.  Skin clean, dry and intact without evidence of skin break down, no evidence of skin tears noted. IV catheter discontinued intact. Site without signs and symptoms of complications. Dressing and pressure applied. Pt denies pain at the site currently. No complaints noted.  Patient free of lines, drains, and wounds.   An After Visit Summary (AVS) was printed and given to the patient. Patient escorted via wheelchair, and discharged home via private auto.  Peyton SHAUNNA Pepper, RN

## 2024-07-22 NOTE — Discharge Summary (Signed)
 Physician Discharge Summary   Patient: Brandi Bryant MRN: [id number] DOB: [birth year]  Admit date:     07/17/2024  Discharge date: {dischdate:26783}  Discharge Physician: Concepcion Riser   PCP: Delbert Clam, MD   Recommendations at discharge:  {Tip this will not be part of the note when signed- Example include specific recommendations for outpatient follow-up, pending tests to follow-up on. (Optional):26781}  ***  Discharge Diagnoses: Principal Problem:   AKI (acute kidney injury) (HCC) Active Problems:   HTN (hypertension)   Diabetes mellitus (HCC)   History of breast cancer   Hyperlipidemia   CAD (coronary artery disease), native coronary artery   Bell's palsy   UTI (urinary tract infection)   NAFLD (nonalcoholic fatty liver disease)   Diabetic polyneuropathy associated with type 2 diabetes mellitus (HCC)   GERD (gastroesophageal reflux disease)   Other cirrhosis of liver (HCC)   Urinary tract infection   E coli bacteremia   Hypokalemia  Resolved Problems:   * No resolved hospital problems. [facility] Course: No notes on file  Assessment and Plan: No notes have been filed under this hospital service. Service: Hospitalist     {Tip this will not be part of the note when signed Body mass index is 30.61 kg/m. , ,  (Optional):26781}  {(NOTE) Pain control PDMP Statment (Optional):26782} Consultants: *** Procedures performed: ***  Disposition: {Plan; Disposition:26390} Diet recommendation:  Discharge Diet Orders (From admission, onward)     Start     Ordered   07/22/24 0000  Diet - low sodium heart healthy        07/22/24 1122   07/22/24 0000  Diet Carb Modified        07/22/24 1122           {Diet_Plan:26776} DISCHARGE MEDICATION: Allergies as of 07/22/2024       Reactions   Gadolinium Derivatives Nausea And Vomiting   Code: VOM, Desc: Pt began vomiting 45 sec after MRI contrast injection of Multihance , Onset Date: [id number]    Iodinated Contrast Media Nausea And Vomiting        Medication List     TAKE these medications    aspirin  EC 325 MG tablet Take 1 tablet (325 mg total) by mouth daily.   cefadroxil  500 MG capsule Commonly known as: DURICEF Take 2 capsules (1,000 mg total) by mouth 2 (two) times daily for 5 days.   diltiazem  180 MG 24 hr capsule Commonly known as: CARDIZEM  CD Take 1 capsule (180 mg total) by mouth daily.   escitalopram  10 MG tablet Commonly known as: Lexapro  Tome 1 tableta (10 mg en total) por va oral diariamente. (Take 1 tablet (10 mg total) by mouth daily.)   glipiZIDE  5 MG tablet Commonly known as: GLUCOTROL  Take 1 tablet (5 mg total) by mouth 2 (two) times daily before a meal.   HYDROcodone -acetaminophen  5-325 MG tablet Commonly known as: NORCO/VICODIN Take 1 tablet by mouth every 6 (six) hours as needed for moderate pain (pain score 4-6).   lisinopril -hydrochlorothiazide  20-25 MG tablet Commonly known as: ZESTORETIC  Tome 1 tableta por va oral diariamente. (Take 1 tablet by mouth daily.)   rosuvastatin  10 MG tablet Commonly known as: Crestor  Tome 1 tableta (10 mg en total) por va oral diariamente. (Take 1 tablet (10 mg total) by mouth daily.)   traZODone  100 MG tablet Commonly known as: DESYREL  Take 1 tablet (100 mg total) by mouth at bedtime as needed for sleep.   Trulicity  3 MG/0.5ML Soaj Generic drug: Dulaglutide  Inject  3 mg as directed once a week.        Discharge Exam: Filed Weights   07/18/24 0951  Weight: 71.1 kg   ***  Condition at discharge: {DC Condition:26389}  The results of significant diagnostics from this hospitalization (including imaging, microbiology, ancillary and laboratory) are listed below for reference.   Imaging Studies: MR LIVER W WO CONTRAST Result Date: 07/19/2024 CLINICAL DATA:  Liver lesion and cirrhosis EXAM: MRI ABDOMEN WITHOUT AND WITH CONTRAST TECHNIQUE: Multiplanar multisequence MR imaging of the abdomen was  performed both before and after the administration of intravenous contrast. CONTRAST:  10mL GADAVIST  GADOBUTROL  1 MMOL/ML IV SOLN COMPARISON:  CT of 07/17/2024.  Ultrasound 07/19/2024. FINDINGS: Portions of the exam are mildly motion degraded. This especially involves the early postcontrast images. Lower chest: Mild cardiomegaly. Hepatobiliary: Advanced cirrhosis. Corresponding to the CT abnormality, within segment 7, is a 8 mm focus of T1 hypointensity in 24/10. Not visualized on other precontrast series. No arterial phase hyperenhancement, given motion degradation. Portal venous phase and delayed hypoenhancement. Cholecystectomy, without biliary ductal dilatation. Pancreas: Pancreatic atrophy. No duct dilatation or acute inflammation. Spleen:  Normal in size, without focal abnormality. Adrenals/Urinary Tract:  Normal kidneys, without hydronephrosis. Stomach/Bowel: Normal stomach and abdominal bowel loops. Vascular/Lymphatic: Aortic atherosclerosis. Portal venous hypertension, with left upper quadrant portosystemic collaterals. No retroperitoneal or retrocrural adenopathy. Other:  No ascites. Musculoskeletal: No acute osseous abnormality. IMPRESSION: 1. Mildly motion degraded exam. 2. Cirrhosis and portal venous hypertension. 3. Segment 7 liver lesion is not typical of hepatocellular carcinoma, given lack of arterial phase hyperenhancement. This remains indeterminate (especially given motion) and is considered LR 3, intermediate probability for hepatocellular carcinoma. Recommend pre and post-contrast abdominal MRI follow-up in 6 months. 4.  Aortic Atherosclerosis (ICD10-I70.0). Electronically Signed   By: Rockey Kilts M.D.   On: 07/19/2024 10:26   US  Abdomen Limited RUQ (LIVER/GB) Result Date: 07/19/2024 CLINICAL DATA:  68 year old female with right upper quadrant abdominal pain. EXAM: ULTRASOUND ABDOMEN LIMITED RIGHT UPPER QUADRANT COMPARISON:  Noncontrast CT Abdomen and Pelvis 07/17/2024. FINDINGS:  Gallbladder: Surgically absent. Common bile duct: Diameter: 6 mm, within normal limits. Liver: Nodular, cirrhotic liver. Heterogeneous liver echogenicity. No discrete liver lesion by ultrasound. Portal vein is patent on color Doppler imaging with normal direction of blood flow towards the liver. Other: Negative visible right kidney.  No free fluid. IMPRESSION: 1. Cirrhotic Liver. Patent, hepatopetal portal vein flow. No discrete liver lesion by Ultrasound. 2. Prior cholecystectomy. No evidence of bile duct obstruction. Electronically Signed   By: VEAR Hurst M.D.   On: 07/19/2024 07:55   CT ABDOMEN PELVIS WO CONTRAST Result Date: 07/17/2024 CLINICAL DATA:  RLQ abdominal pain. EXAM: CT ABDOMEN AND PELVIS WITHOUT CONTRAST TECHNIQUE: Multidetector CT imaging of the abdomen and pelvis was performed following the standard protocol without IV contrast. RADIATION DOSE REDUCTION: This exam was performed according to the departmental dose-optimization program which includes automated exposure control, adjustment of the mA and/or kV according to patient size and/or use of iterative reconstruction technique. COMPARISON:  CT scan renal stone protocol from 03/06/2024. FINDINGS: Lower chest: There are several sub 4 mm solid noncalcified nodules in the visualized bilateral lungs (marked with electronic arrow sign on series 5). The nodule in the middle lobe (series 5, image 3), is new since the prior study. The other nodules in the left lung lower lobe are unchanged since the prior study from 05/30/2021 and favored benign. The lung bases are otherwise clear. No pleural effusion. The heart is normal in  size. No pericardial effusion. Hepatobiliary: The liver is normal in size. There is liver surface irregularity/nodularity, compatible with cirrhosis. There is an ill-defined approximately 9 x 14 mm hypoattenuating lesion in the right hepatic lobe, segment 8, which is incompletely characterized on the current exam and appears slightly  enlarged since the prior study from 03/06/2024. This lesion was not distinctly seen on the prior CT scan from 05/30/2021. Further evaluation with nonemergent MRI abdomen as per hepatic mass protocol is recommended. No other focal liver lesion seen. No intrahepatic bile duct dilation. There is mild prominence of the extrahepatic bile duct, most likely due to post cholecystectomy status. Gallbladder is surgically absent. Pancreas: Unremarkable. No pancreatic ductal dilatation or surrounding inflammatory changes. Spleen: Within normal limits. No focal lesion. Adrenals/Urinary Tract: Adrenal glands are unremarkable. No suspicious renal mass within the limitations of this unenhanced exam. No nephroureterolithiasis or obstructive uropathy on either side. There is a subcentimeter slightly hyperattenuating structure arising from the left kidney upper pole, anteriorly, incompletely characterized on the current exam but similar to the prior study and favored to represent proteinaceous/hemorrhagic cysts. Unremarkable urinary bladder. Stomach/Bowel: No disproportionate dilation of the small or large bowel loops. No evidence of abnormal bowel wall thickening or inflammatory changes. The appendix is unremarkable. There are multiple diverticula throughout the colon, without imaging signs of diverticulitis. Vascular/Lymphatic: No ascites or pneumoperitoneum. No abdominal or pelvic lymphadenopathy, by size criteria. No aneurysmal dilation of the major abdominal arteries. There are moderate peripheral atherosclerotic vascular calcifications of the aorta and its major branches. Reproductive: Not well evaluated on the CT scan exam. However, having said that, normal-size anteverted uterus noted exhibiting diffuse myometrial calcifications. No large adnexal mass seen. Other: There is a tiny fat containing umbilical hernia. The soft tissues and abdominal wall are otherwise unremarkable. Musculoskeletal: No suspicious osseous lesions.  There are mild multilevel degenerative changes in the visualized spine. IMPRESSION: 1. No acute inflammatory process identified within the abdomen or pelvis. Unremarkable appendix. No bowel obstruction. 2. No nephroureterolithiasis or obstructive uropathy on either side. 3. There is an ill-defined approximately 9 x 14 mm hypoattenuating lesion in the right hepatic lobe, segment 8, which is incompletely characterized on the current exam and appears slightly enlarged since the prior study from 03/06/2024. Further evaluation with nonemergent MRI abdomen as per hepatic mass protocol is recommended. 4. Multiple other nonacute observations, as described above. Aortic Atherosclerosis (ICD10-I70.0). Electronically Signed   By: Ree Molt M.D.   On: 07/17/2024 14:32    Microbiology: Results for orders placed or performed during the hospital encounter of 07/17/24  Resp panel by RT-PCR (RSV, Flu A&B, Covid) Anterior Nasal Swab     Status: None   Collection Time: 07/17/24 11:17 AM   Specimen: Anterior Nasal Swab  Result Value Ref Range Status   SARS Coronavirus 2 by RT PCR NEGATIVE NEGATIVE Final   Influenza A by PCR NEGATIVE NEGATIVE Final   Influenza B by PCR NEGATIVE NEGATIVE Final    Comment: (NOTE) The Xpert Xpress SARS-CoV-2/FLU/RSV plus assay is intended as an aid in the diagnosis of influenza from Nasopharyngeal swab specimens and should not be used as a sole basis for treatment. Nasal washings and aspirates are unacceptable for Xpert Xpress SARS-CoV-2/FLU/RSV testing.  Fact Sheet for Patients: BloggerCourse.com  Fact Sheet for Healthcare Providers: SeriousBroker.it  This test is not yet approved or cleared by the [place]  FDA and has been authorized for detection and/or diagnosis of SARS-CoV-2 by FDA under an Emergency Use Authorization (EUA). This EUA  will remain in effect (meaning this test can be used) for the duration of  the COVID-19 declaration under Section 564(b)(1) of the Act, 21 U.S.C. section 360bbb-3(b)(1), unless the authorization is terminated or revoked.     Resp Syncytial Virus by PCR NEGATIVE NEGATIVE Final    Comment: (NOTE) Fact Sheet for Patients: BloggerCourse.com  Fact Sheet for Healthcare Providers: SeriousBroker.it  This test is not yet approved or cleared by the [place]  FDA and has been authorized for detection and/or diagnosis of SARS-CoV-2 by FDA under an Emergency Use Authorization (EUA). This EUA will remain in effect (meaning this test can be used) for the duration of the COVID-19 declaration under Section 564(b)(1) of the Act, 21 U.S.C. section 360bbb-3(b)(1), unless the authorization is terminated or revoked.  Performed at [facility], [address], [place], [place] [address]   Culture, blood (routine x 2)     Status: Abnormal   Collection Time: 07/17/24  2:38 PM   Specimen: BLOOD  Result Value Ref Range Status   Specimen Description BLOOD RIGHT ANTECUBITAL  Final   Special Requests   Final    BOTTLES DRAWN AEROBIC AND ANAEROBIC Blood Culture results may not be optimal due to an inadequate volume of blood received in culture bottles   Culture  Setup Time   Final    GRAM NEGATIVE RODS GRAM POSITIVE COCCI AEROBIC BOTTLE ONLY CRITICAL RESULT CALLED TO, READ BACK BY AND VERIFIED WITH: PHARMD V BRYK [id number] FCP    Culture (A)  Final    ESCHERICHIA COLI STAPHYLOCOCCUS CAPITIS THE SIGNIFICANCE OF ISOLATING THIS ORGANISM FROM A SINGLE SET OF BLOOD CULTURES WHEN MULTIPLE SETS ARE DRAWN IS UNCERTAIN. PLEASE NOTIFY THE MICROBIOLOGY DEPARTMENT WITHIN ONE WEEK IF SPECIATION AND SENSITIVITIES ARE REQUIRED. Performed at [facility], [address] [place], [place] [address]    Report Status 07/21/2024 FINAL  Final   Organism ID, Bacteria ESCHERICHIA COLI  Final      Susceptibility   Escherichia coli  - MIC*    AMPICILLIN >=32 RESISTANT Resistant     CEFAZOLIN  (NON-URINE) 2 SENSITIVE Sensitive     CEFEPIME <=0.12 SENSITIVE Sensitive     ERTAPENEM <=0.12 SENSITIVE Sensitive     CEFTRIAXONE  <=0.25 SENSITIVE Sensitive     CIPROFLOXACIN  <=0.06 SENSITIVE Sensitive     GENTAMICIN <=1 SENSITIVE Sensitive     MEROPENEM <=0.25 SENSITIVE Sensitive     TRIMETH /SULFA  >=320 RESISTANT Resistant     AMPICILLIN/SULBACTAM 16 INTERMEDIATE Intermediate     PIP/TAZO Value in next row Sensitive ug/mL     <=4 SENSITIVEThis is a modified FDA-approved test that has been validated and its performance characteristics determined by the reporting laboratory.  This laboratory is certified under the Clinical Laboratory Improvement Amendments CLIA as qualified to perform high complexity clinical laboratory testing.    * ESCHERICHIA COLI  Blood Culture ID Panel (Reflexed)     Status: Abnormal   Collection Time: 07/17/24  2:38 PM  Result Value Ref Range Status   Enterococcus faecalis NOT DETECTED NOT DETECTED Final   Enterococcus Faecium NOT DETECTED NOT DETECTED Final   Listeria monocytogenes NOT DETECTED NOT DETECTED Final   Staphylococcus species DETECTED (A) NOT DETECTED Final    Comment: CRITICAL RESULT CALLED TO, READ BACK BY AND VERIFIED WITH: PHARMD V BRYK [id number] FCP    Staphylococcus aureus (BCID) NOT DETECTED NOT DETECTED Final   Staphylococcus epidermidis NOT DETECTED NOT DETECTED Final   Staphylococcus lugdunensis NOT DETECTED NOT DETECTED  Final   Streptococcus species NOT DETECTED NOT DETECTED Final   Streptococcus agalactiae NOT DETECTED NOT DETECTED Final   Streptococcus pneumoniae NOT DETECTED NOT DETECTED Final   Streptococcus pyogenes NOT DETECTED NOT DETECTED Final   A.calcoaceticus-baumannii NOT DETECTED NOT DETECTED Final   Bacteroides fragilis NOT DETECTED NOT DETECTED Final   Enterobacterales DETECTED (A) NOT DETECTED Final    Comment: Enterobacterales represent a large order of gram  negative bacteria, not a single organism. CRITICAL RESULT CALLED TO, READ BACK BY AND VERIFIED WITH: PHARMD V BRYK [id number] FCP    Enterobacter cloacae complex NOT DETECTED NOT DETECTED Final   Escherichia coli DETECTED (A) NOT DETECTED Final    Comment: CRITICAL RESULT CALLED TO, READ BACK BY AND VERIFIED WITH: PHARMD V BRYK [phone] FCP    Klebsiella aerogenes NOT DETECTED NOT DETECTED Final   Klebsiella oxytoca NOT DETECTED NOT DETECTED Final   Klebsiella pneumoniae NOT DETECTED NOT DETECTED Final   Proteus species NOT DETECTED NOT DETECTED Final   Salmonella species NOT DETECTED NOT DETECTED Final   Serratia marcescens NOT DETECTED NOT DETECTED Final   Haemophilus influenzae NOT DETECTED NOT DETECTED Final   Neisseria meningitidis NOT DETECTED NOT DETECTED Final   Pseudomonas aeruginosa NOT DETECTED NOT DETECTED Final   Stenotrophomonas maltophilia NOT DETECTED NOT DETECTED Final   Candida albicans NOT DETECTED NOT DETECTED Final   Candida auris NOT DETECTED NOT DETECTED Final   Candida glabrata NOT DETECTED NOT DETECTED Final   Candida krusei NOT DETECTED NOT DETECTED Final   Candida parapsilosis NOT DETECTED NOT DETECTED Final   Candida tropicalis NOT DETECTED NOT DETECTED Final   Cryptococcus neoformans/gattii NOT DETECTED NOT DETECTED Final   CTX-M ESBL NOT DETECTED NOT DETECTED Final   Carbapenem resistance IMP NOT DETECTED NOT DETECTED Final   Carbapenem resistance KPC NOT DETECTED NOT DETECTED Final   Carbapenem resistance NDM NOT DETECTED NOT DETECTED Final   Carbapenem resist OXA 48 LIKE NOT DETECTED NOT DETECTED Final   Carbapenem resistance VIM NOT DETECTED NOT DETECTED Final    Comment: Performed at [facility], [address] [place], [place] [address]  Culture, blood (routine x 2)     Status: None   Collection Time: 07/17/24  6:31 PM   Specimen: BLOOD  Result Value Ref Range Status   Specimen Description BLOOD SITE NOT SPECIFIED  Final   Special  Requests   Final    BOTTLES DRAWN AEROBIC AND ANAEROBIC Blood Culture results may not be optimal due to an inadequate volume of blood received in culture bottles   Culture   Final    NO GROWTH 5 DAYS Performed at [facility], [address], [place], [place] [address]    Report Status 07/22/2024 FINAL  Final  Culture, blood (Routine X 2) w Reflex to ID Panel     Status: None (Preliminary result)   Collection Time: 07/20/24  6:44 PM   Specimen: BLOOD  Result Value Ref Range Status   Specimen Description BLOOD SITE NOT SPECIFIED  Final   Special Requests   Final    BOTTLES DRAWN AEROBIC AND ANAEROBIC Blood Culture adequate volume   Culture   Final    NO GROWTH 2 DAYS Performed at [facility], [address] [place], [place] [address]    Report Status PENDING  Incomplete  Culture, blood (Routine X 2) w Reflex to ID Panel     Status: None (Preliminary result)   Collection Time:  07/20/24  6:44 PM   Specimen: BLOOD  Result Value Ref Range Status   Specimen Description BLOOD SITE NOT SPECIFIED  Final   Special Requests   Final    BOTTLES DRAWN AEROBIC AND ANAEROBIC Blood Culture adequate volume   Culture   Final    NO GROWTH 2 DAYS Performed at [facility], [address], [place], [place] [address]    Report Status PENDING  Incomplete    Labs: CBC: Recent Labs  Lab 07/17/24 1120 07/18/24 0201 07/19/24 0144  WBC 9.3 6.4 7.0  HGB 14.6 12.0 12.3  HCT 41.1 33.9* 34.5*  MCV 86.5 87.1 86.5  PLT 109* 92* 94*   Basic Metabolic Panel: Recent Labs  Lab 07/17/24 1520 07/18/24 0201 07/19/24 0144 07/20/24 0422 07/21/24 0610 07/22/24 0329  NA  --  140 140 141 139 138  K  --  3.4* 4.0 3.5 3.8 3.5  CL  --  110 109 107 106 104  CO2  --  20* 21* 23 24 24   GLUCOSE  --  158* 167* 154* 161* 166*  BUN  --  41* 20 13 11 8   CREATININE  --  1.20* 0.99 0.75 0.75 0.77  CALCIUM   --  7.7* 8.1* 8.1* 8.2* 8.0*  MG 1.7  --   --   --   --   --    Liver Function  Tests: Recent Labs  Lab 07/17/24 1120 07/18/24 0201 07/20/24 0422 07/21/24 0610 07/22/24 0329  AST 94* 92* 94* 89* 77*  ALT 53* 48* 54* 50* 45*  ALKPHOS 120 105 155* 184* 193*  BILITOT 1.8* 1.6* 1.1 1.1 1.0  PROT 7.0 5.4* 5.5* 5.8* 5.9*  ALBUMIN 2.7* 2.0* 1.9* 1.9* 1.9*   CBG: Recent Labs  Lab 07/20/24 2046 07/21/24 0747 07/21/24 1152 07/21/24 1703 07/22/24 0743  GLUCAP 171* 146* 160* 149* 158*    Discharge time spent: {LESS THAN/GREATER THAN:26388} 30 minutes.  Signed: Concepcion Riser, MD [facility] 07/22/2024

## 2024-07-23 ENCOUNTER — Telehealth: Payer: Self-pay | Admitting: *Deleted

## 2024-07-23 NOTE — Transitions of Care (Post Inpatient/ED Visit) (Signed)
   07/23/2024  Name: Brandi Bryant MRN: [id number] DOB: 06-19-1956  Today's TOC FU Call Status: Today's TOC FU Call Status:: Unsuccessful Call (1st Attempt) Unsuccessful Call (1st Attempt) Date: 07/23/24  Attempted to reach the patient regarding the most recent Inpatient/ED visit.  Call made while utilizing [employer] [phone], via [employer].  Follow Up Plan: Additional outreach attempts will be made to reach the patient to complete the Transitions of Care (Post Inpatient/ED visit) call.   Andrea Dimes RN, BSN [place]  [facility] Health RN Care Manager [phone]

## 2024-07-24 ENCOUNTER — Telehealth: Payer: Self-pay | Admitting: *Deleted

## 2024-07-24 NOTE — Transitions of Care (Post Inpatient/ED Visit) (Signed)
   07/24/2024  Name: Brandi Bryant MRN: [id number] DOB: [birth year]  Today's TOC FU Call Status: Today's TOC FU Call Status:: Unsuccessful Call (2nd Attempt) Unsuccessful Call (2nd Attempt) Date: 07/24/24  Attempted to reach the patient regarding the most recent Inpatient/ED visit.  Follow Up Plan: Additional outreach attempts will be made to reach the patient to complete the Transitions of Care (Post Inpatient/ED visit) call.   Andrea Dimes RN, BSN [place]  [facility] Health RN Care Manager [phone]

## 2024-07-25 ENCOUNTER — Telehealth: Payer: Self-pay | Admitting: *Deleted

## 2024-07-25 LAB — CULTURE, BLOOD (ROUTINE X 2)
Culture: NO GROWTH
Culture: NO GROWTH
Special Requests: ADEQUATE
Special Requests: ADEQUATE

## 2024-07-25 NOTE — Transitions of Care (Post Inpatient/ED Visit) (Signed)
   07/25/2024  Name: Quincee Gittens MRN: [id number] DOB: October 08, 1956  Today's TOC FU Call Status: Today's TOC FU Call Status:: Unsuccessful Call (3rd Attempt) Unsuccessful Call (3rd Attempt) Date: 07/25/24  Attempted to reach the patient regarding the most recent Inpatient/ED visit.  Follow Up Plan: No further outreach attempts will be made at this time. We have been unable to contact the patient.  Andrea Dimes RN, BSN [place]  [facility] Health RN Care Manager [phone]

## 2024-07-26 ENCOUNTER — Other Ambulatory Visit: Payer: Self-pay

## 2024-08-13 ENCOUNTER — Other Ambulatory Visit: Payer: Self-pay

## 2024-08-30 ENCOUNTER — Other Ambulatory Visit: Payer: Self-pay

## 2024-09-02 ENCOUNTER — Ambulatory Visit: Payer: Self-pay | Admitting: Obstetrics and Gynecology

## 2024-09-16 ENCOUNTER — Encounter: Payer: Self-pay | Admitting: Family Medicine

## 2024-09-16 ENCOUNTER — Ambulatory Visit: Payer: Self-pay | Attending: Family Medicine | Admitting: Family Medicine

## 2024-09-16 ENCOUNTER — Other Ambulatory Visit: Payer: Self-pay

## 2024-09-16 VITALS — BP 152/71 | HR 69 | Temp 98.2°F | Ht 60.0 in | Wt 150.0 lb

## 2024-09-16 DIAGNOSIS — I10 Essential (primary) hypertension: Secondary | ICD-10-CM

## 2024-09-16 DIAGNOSIS — M549 Dorsalgia, unspecified: Secondary | ICD-10-CM

## 2024-09-16 DIAGNOSIS — Z23 Encounter for immunization: Secondary | ICD-10-CM

## 2024-09-16 DIAGNOSIS — E1140 Type 2 diabetes mellitus with diabetic neuropathy, unspecified: Secondary | ICD-10-CM

## 2024-09-16 DIAGNOSIS — Z79899 Other long term (current) drug therapy: Secondary | ICD-10-CM

## 2024-09-16 DIAGNOSIS — E08649 Diabetes mellitus due to underlying condition with hypoglycemia without coma: Secondary | ICD-10-CM

## 2024-09-16 DIAGNOSIS — E11649 Type 2 diabetes mellitus with hypoglycemia without coma: Secondary | ICD-10-CM

## 2024-09-16 DIAGNOSIS — E1169 Type 2 diabetes mellitus with other specified complication: Secondary | ICD-10-CM

## 2024-09-16 DIAGNOSIS — Z794 Long term (current) use of insulin: Secondary | ICD-10-CM

## 2024-09-16 DIAGNOSIS — E160 Drug-induced hypoglycemia without coma: Secondary | ICD-10-CM

## 2024-09-16 DIAGNOSIS — R0602 Shortness of breath: Secondary | ICD-10-CM

## 2024-09-16 DIAGNOSIS — N3000 Acute cystitis without hematuria: Secondary | ICD-10-CM

## 2024-09-16 DIAGNOSIS — R6883 Chills (without fever): Secondary | ICD-10-CM

## 2024-09-16 DIAGNOSIS — Z7984 Long term (current) use of oral hypoglycemic drugs: Secondary | ICD-10-CM

## 2024-09-16 LAB — POCT URINALYSIS DIP (CLINITEK)
Bilirubin, UA: NEGATIVE
Blood, UA: NEGATIVE
Glucose, UA: 500 mg/dL — AB
Ketones, POC UA: NEGATIVE mg/dL
Nitrite, UA: POSITIVE — AB
POC PROTEIN,UA: NEGATIVE
Spec Grav, UA: 1.015 (ref 1.010–1.025)
Urobilinogen, UA: 1 U/dL
pH, UA: 6.5 (ref 5.0–8.0)

## 2024-09-16 LAB — POCT GLYCOSYLATED HEMOGLOBIN (HGB A1C): HbA1c, POC (controlled diabetic range): 5.9 % (ref 0.0–7.0)

## 2024-09-16 MED ORDER — NITROFURANTOIN MONOHYD MACRO 100 MG PO CAPS
100.0000 mg | ORAL_CAPSULE | Freq: Two times a day (BID) | ORAL | 0 refills | Status: AC
  Filled 2024-09-16: qty 10, 5d supply, fill #0

## 2024-09-16 NOTE — Patient Instructions (Signed)
 Infeccin de las vas urinarias en las mujeres Urinary Tract Infection, Female La infeccin de las vas urinarias (IVU) se presenta en las vas urinarias. Las vas urinarias estn formadas por rganos que producen, Barrister's clerk y eliminan la orina en el cuerpo. Estos rganos incluyen los siguientes: Los riones. Los urteres. La vejiga. La uretra. Cules son las causas? La mayora de las IVU son causadas por grmenes llamados bacterias. Pueden estar dentro o cerca de los genitales. Estos grmenes proliferan y causan hinchazn en las vas urinarias. Qu incrementa el riesgo? Una persona es ms propensa a tener una IVU si: Es mujer. La uretra es ms corta en las mujeres que en los hombres. Tiene colocado un tubo blando llamado catter que drena la orina. No puede controlar cuando orina o defeca. Tiene dificultad para orinar debido a: Un clculo renal. Una obstruccin urinaria. Un trastorno nervioso que afecta la vejiga. No bebe una cantidad suficiente de lquido. Es sexualmente activa. Usa  un mtodo anticonceptivo que se coloca dentro de la vagina, como un espermicida. Est embarazada. Tiene niveles bajos de la hormona estrgeno en el cuerpo. Es Psychologist, sport and exercise. Tambin es ms probable que contraiga una IVU si tiene otros problemas de [place]. Pueden incluir: Diabetes. El sistema inmunitario debilitado. Su sistema inmunitario es [address] de defensa de su cuerpo. Anemia drepanoctica. Lesin en la columna vertebral. Cules son los signos o sntomas? Entre los sntomas, se pueden incluir los siguientes: Necesidad inmediata de Geographical information systems officer. Hacer poca cantidad de orina con mucha frecuencia. Dolor o ardor al Geographical information systems officer. Sangre en la orina. Orina con mal olor u [employer]. Dolor en la parte inferior de la espalda o en el vientre. Tambin puede: Sentirse confundido. Este puede ser Financial risk analyst sntoma en los adultos [place]. Vomitar. No tener apetito. Cansarse o irritarse con facilidad. Tener  fiebre o escalofros. Cmo se diagnostica? Las IVU se diagnostican en funcin de los antecedentes mdicos y de Nurse, learning disability. Tambin pueden hacerle otros estudios. Pueden incluir: Anlisis de comoros. Anlisis de [place]. Pruebas de infecciones de transmisin sexual (ITS). Si ha tenido ms de una IVU, es posible que deba hacerse estudios de diagnstico por imgenes para Financial risk analyst por qu sigue contrayndolas. Cmo se trata? Una IVU puede tratarse de las siguientes maneras: Tomar antibiticos u otros medicamentos. Beber suficiente lquido como para Pharmacologist la orina de color amarillo plido. En casos poco frecuentes, una IVU puede causar una afeccin muy grave llamada sepsis. Es posible que la sepsis deba recibir Pharmacist, hospital hospital. Siga estas instrucciones en su casa: Medicamentos Tome los medicamentos solamente como se lo haya indicado el mdico. Si le dieron antibiticos, tmelos o selos como se lo haya indicado el mdico. No deje de tomarlos aunque comience a sentirse mejor. Instrucciones generales Asegrese de hacer lo siguiente: Orine con frecuencia y vace la vejiga por completo. No contenga la [employer]. Lmpiese de adelante hacia atrs despus de Automotive engineer. Use cada trozo de papel higinico una sola vez cuando se limpie. Orine despus de [employer]. No se haga duchas vaginales ni use aerosoles o talcos en la zona genital. Comunquese con un mdico si: Sus sntomas no han mejorado despus de 1 o 2 das de tratamiento con antibiticos. Los sntomas desaparecen y luego reaparecen. Tiene fiebre o escalofros. Vomita o tiene ganas de vomitar. Solicite ayuda de inmediato si: Tiene dolor muy intenso en la espalda o la parte baja del vientre. Se desmaya. Esta informacin no tiene Theme park manager el consejo del mdico. Manufacturing engineer  de hacerle al mdico cualquier pregunta que tenga. Document Revised: 07/14/2023 Document Reviewed: 07/14/2023 Elsevier Patient  Education  2025 [employer].

## 2024-09-16 NOTE — Progress Notes (Signed)
 Subjective:  Patient ID: Brandi Bryant, female    DOB: 07-28-1956  Age: 68 y.o. MRN: [id number]  CC: Medical Management of Chronic Issues (Having low blood sugar reading/Having chills)     Discussed the use of AI scribe software for clinical note transcription with the patient, who gave verbal consent to proceed.  History of Present Illness Brandi Bryant is a 68 year old female with a history of GERD, hypertension, hyperlipidemia, breast cancer status post left lumpectomy and radiation, type 2 diabetes mellitus (A1c 6.7) previous CVA, TIA in 03/2018, VIN 2. who presents with low blood sugar and tremors.  She experiences low blood sugar levels, with readings as low as 70 or 80 mg/dL, accompanied by dizziness and nervousness. Her diabetes medications include Trulicity  once a week and glipizide  5 mg twice a day, taken as half a pill twice daily. Her A1c is 5.9.  Chills and tremors have been present since about a month after hospital discharge in August. These are not associated with fever but are accompanied by nervousness and headaches. She describes her whole body trembling and feeling cold, especially when outside. In 06/2024 she was admitted for Acute cystitis and AKI.  Upper back pain has been present for about a month, primarily affecting her ability to sleep. The pain is severe, located in the back of her neck and shoulders, and often keeps her awake until 3 or 4 AM. Denies numbness or tingling.  She experiences upper respiratory symptoms at night, including chest pain, shortness of breath, and chest tightness, which disrupt her sleep.    Past Medical History:  Diagnosis Date   Abdominal pain    Allergy    Arthralgia 08/13/2013   Bell palsy 2006   Bell's palsy    Breast cancer (HCC) 2009   Left Breast Cancer   Cataract    Chest pain 09/30/2013   Cholelithiasis    Chronic sinusitis 05/16/2017   Colon cancer screening 10/27/2015   Complication of anesthesia      tubal ligation, in [place]  had weakness, couldnt stand up the next day, was given pills for oxygen for Brain.  1 month after I was having loss of attentiviness, still have to focus  carefully.    Coronary artery disease    CVA (cerebral vascular accident) (HCC) 03/30/2018   Depression    Diabetes mellitus    Type II   Dyspnea    at times- when air conditioner is runnimg, heat also    Encounter for screening colonoscopy 10/30/2015   GERD (gastroesophageal reflux disease)    Hepatic steatosis    History of breast cancer 06/01/2012   HTN (hypertension)    Hx of adenomatous polyp of colon 11/07/2019   Hyperlipidemia    Internal carotid artery stenosis 04/03/2018   Personal history of chemotherapy 2009   Left Breast Cancer   Personal history of radiation therapy 2009   Left Breast Cancer   Stroke [facility])    2006, 2015   TIA (transient ischemic attack) 02/20/2014    Past Surgical History:  Procedure Laterality Date   ANKLE ARTHROSCOPY Right 12/14/2016   Procedure: Right Ankle Arthroscopic Debridement;  Surgeon: Jerona Harden GAILS, MD;  Location: [facility] OR;  Service: Orthopedics;  Laterality: Right;   Arm surgery     Left tendon lengthen   BREAST LUMPECTOMY Left 2009   CHOLECYSTECTOMY N/A 12/09/2015   Procedure: LAPAROSCOPIC CHOLECYSTECTOMY WITH INTRAOPERATIVE CHOLANGIOGRAM;  Surgeon: Camellia Blush, MD;  Location: [facility] OR;  Service: General;  Laterality:  N/A;   LEFT HEART CATHETERIZATION WITH CORONARY ANGIOGRAM N/A 10/30/2013   Procedure: LEFT HEART CATHETERIZATION WITH CORONARY ANGIOGRAM;  Surgeon: Maude JAYSON Emmer, MD;  Location: [facility] CATH LAB;  Service: Cardiovascular;  Laterality: N/A;   porta cath Right    Removal of Porta cath Right    TUBAL LIGATION      Family History  Problem Relation Age of Onset   Thyroid cancer Sister    Hypertension Mother    Diabetes Mother    Migraines Daughter    Colon cancer Neg Hx    Esophageal cancer Neg Hx    Stomach cancer Neg Hx    Rectal cancer Neg Hx      Social History   Socioeconomic History   Marital status: Single    Spouse name: Not on file   Number of children: 6   Years of education: Not on file   Highest education level: High school graduate  Occupational History   Not on file  Tobacco Use   Smoking status: Never   Smokeless tobacco: Never  Vaping Use   Vaping status: Never Used  Substance and Sexual Activity   Alcohol use: No   Drug use: No   Sexual activity: Not Currently    Birth control/protection: Surgical  Other Topics Concern   Not on file  Social History Narrative   Single with 6 children    lives with her daughter   Right handed   Never smoker no drug use no alcohol   Social Drivers of Corporate Investment Banker Strain: Low Risk  (10/16/2023)   Overall Financial Resource Strain (CARDIA)    Difficulty of Paying Living Expenses: Not hard at all  Food Insecurity: No Food Insecurity (07/17/2024)   Hunger Vital Sign    Worried About Running Out of Food in the Last Year: Never true    Ran Out of Food in the Last Year: Never true  Transportation Needs: No Transportation Needs (07/17/2024)   PRAPARE - Administrator, Civil Service (Medical): No    Lack of Transportation (Non-Medical): No  Physical Activity: Insufficiently Active (10/16/2023)   Exercise Vital Sign    Days of Exercise per Week: 3 days    Minutes of Exercise per Session: 20 min  Stress: No Stress Concern Present (10/16/2023)   [employer] of Occupational Health - Occupational Stress Questionnaire    Feeling of Stress : Only a little  Social Connections: Socially Isolated (07/17/2024)   Social Connection and Isolation Panel    Frequency of Communication with Friends and Family: Twice a week    Frequency of Social Gatherings with Friends and Family: Twice a week    Attends Religious Services: Never    Database Administrator or Organizations: No    Attends Engineer, Structural: Never    Marital Status: Separated     Allergies  Allergen Reactions   Gadolinium Derivatives Nausea And Vomiting    Code: VOM, Desc: Pt began vomiting 45 sec after MRI contrast injection of Multihance , Onset Date: [id number]     Iodinated Contrast Media Nausea And Vomiting    Outpatient Medications Prior to Visit  Medication Sig Dispense Refill   aspirin  325 MG EC tablet Take 1 tablet (325 mg total) by mouth daily. 30 tablet 6   diltiazem  (CARDIZEM  CD) 180 MG 24 hr capsule Take 1 capsule (180 mg total) by mouth daily. 90 capsule 3   Dulaglutide  (TRULICITY ) 3 MG/0.5ML SOAJ Inject 3 mg  as directed once a week. 6 mL 1   escitalopram  (LEXAPRO ) 10 MG tablet Take 1 tablet (10 mg total) by mouth daily. 90 tablet 3   lisinopril -hydrochlorothiazide  (ZESTORETIC ) 20-25 MG tablet Take 1 tablet by mouth daily. 90 tablet 3   rosuvastatin  (CRESTOR ) 10 MG tablet Take 1 tablet (10 mg total) by mouth daily. 90 tablet 3   traZODone  (DESYREL ) 100 MG tablet Take 1 tablet (100 mg total) by mouth at bedtime as needed for sleep. 90 tablet 3   glipiZIDE  (GLUCOTROL ) 5 MG tablet Take 1 tablet (5 mg total) by mouth 2 (two) times daily before a meal. 180 tablet 3   HYDROcodone -acetaminophen  (NORCO/VICODIN) 5-325 MG tablet Take 1 tablet by mouth every 6 (six) hours as needed for moderate pain (pain score 4-6). (Patient not taking: Reported on 09/16/2024) 14 tablet 0   No facility-administered medications prior to visit.     ROS Review of Systems  Constitutional:  Negative for activity change and appetite change.  HENT:  Negative for sinus pressure and sore throat.   Respiratory:  Positive for shortness of breath. Negative for chest tightness and wheezing.   Cardiovascular:  Positive for chest pain. Negative for palpitations.  Gastrointestinal:  Negative for abdominal distention, abdominal pain and constipation.  Genitourinary: Negative.   Musculoskeletal:        See HPI  Neurological:  Positive for dizziness, tremors and headaches.   Psychiatric/Behavioral:  Negative for behavioral problems and dysphoric mood.     Objective:  BP (!) 152/71   Pulse 69   Temp 98.2 F (36.8 C) (Oral)   Ht 5' (1.524 m)   Wt 150 lb (68 kg)   SpO2 98%   BMI 29.29 kg/m      09/16/2024    9:35 AM 09/16/2024    9:10 AM 07/22/2024    8:32 AM  BP/Weight  Systolic BP 152 146 115  Diastolic BP 71 69 52  Wt. (Lbs)  150   BMI  29.29 kg/m2       Physical Exam Constitutional:      Appearance: She is well-developed.  Cardiovascular:     Rate and Rhythm: Normal rate.     Heart sounds: Normal heart sounds. No murmur heard. Pulmonary:     Effort: Pulmonary effort is normal.     Breath sounds: Normal breath sounds. No wheezing or rales.  Chest:     Chest wall: No tenderness.  Abdominal:     General: Bowel sounds are normal. There is no distension.     Palpations: Abdomen is soft. There is no mass.     Tenderness: There is no abdominal tenderness.  Musculoskeletal:        General: Normal range of motion.     Cervical back: Normal range of motion. No tenderness.     Right lower leg: No edema.     Left lower leg: No edema.  Neurological:     Mental Status: She is alert and oriented to person, place, and time.     Sensory: Sensory deficit: tremors, generalized.     Coordination: Coordination abnormal.  Psychiatric:        Mood and Affect: Mood normal.        Latest Ref Rng & Units 07/22/2024    3:29 AM 07/21/2024    6:10 AM 07/20/2024    4:22 AM  CMP  Glucose 70 - 99 mg/dL 833  838  845   BUN 8 - 23 mg/dL 8  11  13   Creatinine 0.44 - 1.00 mg/dL 9.22  9.24  9.24   Sodium 135 - 145 mmol/L 138  139  141   Potassium 3.5 - 5.1 mmol/L 3.5  3.8  3.5   Chloride 98 - 111 mmol/L 104  106  107   CO2 22 - 32 mmol/L 24  24  23    Calcium  8.9 - 10.3 mg/dL 8.0  8.2  8.1   Total Protein 6.5 - 8.1 g/dL 5.9  5.8  5.5   Total Bilirubin 0.0 - 1.2 mg/dL 1.0  1.1  1.1   Alkaline Phos 38 - 126 U/L 193  184  155   AST 15 - 41 U/L 77  89  94    ALT 0 - 44 U/L 45  50  54     Lipid Panel     Component Value Date/Time   CHOL 126 02/03/2022 1059   TRIG 92 02/03/2022 1059   HDL 57 02/03/2022 1059   CHOLHDL 2.2 02/03/2022 1059   CHOLHDL 2.6 03/31/2018 0348   VLDL 33 03/31/2018 0348   LDLCALC 52 02/03/2022 1059    CBC    Component Value Date/Time   WBC 7.0 07/19/2024 0144   RBC 3.99 07/19/2024 0144   HGB 12.3 07/19/2024 0144   HGB 13.4 09/30/2013 1033   HCT 34.5 (L) 07/19/2024 0144   HCT 39.1 09/30/2013 1033   PLT 94 (L) 07/19/2024 0144   PLT 150 09/30/2013 1033   MCV 86.5 07/19/2024 0144   MCV 88.5 09/30/2013 1033   MCH 30.8 07/19/2024 0144   MCHC 35.7 07/19/2024 0144   RDW 13.6 07/19/2024 0144   RDW 12.8 09/30/2013 1033   LYMPHSABS 2.1 03/06/2024 2026   LYMPHSABS 2.1 09/30/2013 1033   MONOABS 0.7 03/06/2024 2026   MONOABS 0.4 09/30/2013 1033   EOSABS 0.6 (H) 03/06/2024 2026   EOSABS 0.2 09/30/2013 1033   BASOSABS 0.0 03/06/2024 2026   BASOSABS 0.0 09/30/2013 1033    Lab Results  Component Value Date   HGBA1C 5.9 09/16/2024   Lab Results  Component Value Date   TSH 1.956 11/16/2021        Assessment & Plan Type 2 diabetes mellitus with hypoglycemia due to diabetes medication Hemoglobin A1c is 5.9. Hypoglycemia likely due to glipizide . - Discontinue glipizide . - Continue Trulicity  -Counseled on Diabetic diet, the healthy plate, 849 minutes of moderate intensity exercise/week Blood sugar logs with fasting goals of 80-120 mg/dl, random of less than 819 and in the event of sugars less than 60 mg/dl or greater than 599 mg/dl encouraged to notify the clinic. Advised on the need for annual eye exams, annual foot exams, Pneumonia vaccine.   Acute cystitis Symptoms and urine test confirm urinary tract infection. - Prescribe nitrofurantoin for urinary tract infection. - Order urine culture to check for antibiotic resistance. - Adjust antibiotic treatment if culture shows resistance to  nitrofurantoin.  Tremors and chills (under evaluation) Symptoms include tremors, chills, nervousness, and headaches.  -Could be secondary to acute cystitis - Order complete blood count to check for infection. - Order thyroid function tests. - Advise to go to the emergency room if symptoms persist and cause is not identified.  Neck and upper back pain Pain may be related to muscle tension from tremors. - Prescribe tizanidine as a muscle relaxant for neck pain.   Hypertension Blood pressure elevated during visit. Current regimen includes lisinopril -hydrochlorothiazide . - Repeat blood pressure measurement before leaving - still elevated upon repeat -Surprisingly BP was  115/52 at her last visit so I will make no changes to her regimen today.  Dyspnea Will order CXR to evaluate given underlying chills Oxygen saturation is normal    Meds ordered this encounter  Medications   nitrofurantoin, macrocrystal-monohydrate, (MACROBID) 100 MG capsule    Sig: Take 1 capsule (100 mg total) by mouth 2 (two) times daily.    Dispense:  10 capsule    Refill:  0    Follow-up: Return in about 3 months (around 12/17/2024) for Chronic medical conditions.       Corrina Sabin, MD, FAAFP. [facility] [place], [place] [phone]   09/16/2024, 12:49 PM

## 2024-09-17 ENCOUNTER — Ambulatory Visit: Payer: Self-pay | Admitting: Family Medicine

## 2024-09-17 DIAGNOSIS — K7460 Unspecified cirrhosis of liver: Secondary | ICD-10-CM

## 2024-09-17 LAB — T3: T3, Total: 160 ng/dL (ref 71–180)

## 2024-09-17 LAB — CMP14+EGFR
ALT: 34 IU/L — ABNORMAL HIGH (ref 0–32)
AST: 58 IU/L — ABNORMAL HIGH (ref 0–40)
Albumin: 3.7 g/dL — ABNORMAL LOW (ref 3.9–4.9)
Alkaline Phosphatase: 163 IU/L — ABNORMAL HIGH (ref 49–135)
BUN/Creatinine Ratio: 14 (ref 12–28)
BUN: 12 mg/dL (ref 8–27)
Bilirubin Total: 0.9 mg/dL (ref 0.0–1.2)
CO2: 19 mmol/L — ABNORMAL LOW (ref 20–29)
Calcium: 9.8 mg/dL (ref 8.7–10.3)
Chloride: 101 mmol/L (ref 96–106)
Creatinine, Ser: 0.86 mg/dL (ref 0.57–1.00)
Globulin, Total: 3.5 g/dL (ref 1.5–4.5)
Glucose: 273 mg/dL — ABNORMAL HIGH (ref 70–99)
Potassium: 4.3 mmol/L (ref 3.5–5.2)
Sodium: 137 mmol/L (ref 134–144)
Total Protein: 7.2 g/dL (ref 6.0–8.5)
eGFR: 74 mL/min/1.73 (ref >59)

## 2024-09-17 LAB — CBC WITH DIFFERENTIAL/PLATELET
Basophils Absolute: 0 x10E3/uL (ref 0.0–0.2)
Basos: 1 %
EOS (ABSOLUTE): 0.2 x10E3/uL (ref 0.0–0.4)
Eos: 3 %
Hematocrit: 41.9 % (ref 34.0–46.6)
Hemoglobin: 13.7 g/dL (ref 11.1–15.9)
Immature Grans (Abs): 0 x10E3/uL (ref 0.0–0.1)
Immature Granulocytes: 0 %
Lymphocytes Absolute: 1.7 x10E3/uL (ref 0.7–3.1)
Lymphs: 32 %
MCH: 31.5 pg (ref 26.6–33.0)
MCHC: 32.7 g/dL (ref 31.5–35.7)
MCV: 96 fL (ref 79–97)
Monocytes Absolute: 0.5 x10E3/uL (ref 0.1–0.9)
Monocytes: 9 %
Neutrophils Absolute: 2.9 x10E3/uL (ref 1.4–7.0)
Neutrophils: 55 %
Platelets: 158 x10E3/uL (ref 150–450)
RBC: 4.35 x10E6/uL (ref 3.77–5.28)
RDW: 12.4 % (ref 11.7–15.4)
WBC: 5.2 x10E3/uL (ref 3.4–10.8)

## 2024-09-17 LAB — TSH: TSH: 3.14 u[IU]/mL (ref 0.450–4.500)

## 2024-09-17 LAB — T4, FREE: Free T4: 1.14 ng/dL (ref 0.82–1.77)

## 2024-09-19 LAB — URINE CULTURE

## 2024-09-30 ENCOUNTER — Other Ambulatory Visit: Payer: Self-pay

## 2024-10-21 ENCOUNTER — Other Ambulatory Visit: Payer: Self-pay

## 2024-10-24 ENCOUNTER — Other Ambulatory Visit (HOSPITAL_BASED_OUTPATIENT_CLINIC_OR_DEPARTMENT_OTHER): Payer: Self-pay

## 2024-11-22 ENCOUNTER — Other Ambulatory Visit: Payer: Self-pay

## 2024-12-10 ENCOUNTER — Ambulatory Visit (HOSPITAL_COMMUNITY)
Admission: EM | Admit: 2024-12-10 | Discharge: 2024-12-10 | Disposition: A | Discharge status: Home / Self Care | Payer: Self-pay | Attending: Family Medicine | Admitting: Family Medicine

## 2024-12-10 ENCOUNTER — Other Ambulatory Visit: Payer: Self-pay

## 2024-12-10 ENCOUNTER — Encounter (HOSPITAL_COMMUNITY): Payer: Self-pay | Admitting: Emergency Medicine

## 2024-12-10 DIAGNOSIS — N3000 Acute cystitis without hematuria: Secondary | ICD-10-CM | POA: Insufficient documentation

## 2024-12-10 LAB — POCT URINE DIPSTICK
Bilirubin, UA: NEGATIVE
Blood, UA: NEGATIVE
Glucose, UA: NEGATIVE mg/dL
Ketones, POC UA: NEGATIVE mg/dL
Nitrite, UA: POSITIVE — AB
Protein Ur, POC: NEGATIVE mg/dL
Spec Grav, UA: 1.015
Urobilinogen, UA: 0.2 U/dL
pH, UA: 5.5

## 2024-12-10 LAB — BASIC METABOLIC PANEL WITH GFR
Anion gap: 13 (ref 5–15)
BUN: 21 mg/dL (ref 8–23)
CO2: 21 mmol/L — ABNORMAL LOW (ref 22–32)
Calcium: 9.5 mg/dL (ref 8.9–10.3)
Chloride: 103 mmol/L (ref 98–111)
Creatinine, Ser: 0.89 mg/dL (ref 0.44–1.00)
GFR, Estimated: >60 mL/min
Glucose, Bld: 97 mg/dL (ref 70–99)
Potassium: 4.4 mmol/L (ref 3.5–5.1)
Sodium: 137 mmol/L (ref 135–145)

## 2024-12-10 LAB — CBC
HCT: 41.5 % (ref 36.0–46.0)
Hemoglobin: 14.7 g/dL (ref 12.0–15.0)
MCH: 31.1 pg (ref 26.0–34.0)
MCHC: 35.4 g/dL (ref 30.0–36.0)
MCV: 87.9 fL (ref 80.0–100.0)
Platelets: 141 K/uL — ABNORMAL LOW (ref 150–400)
RBC: 4.72 MIL/uL (ref 3.87–5.11)
RDW: 13.2 % (ref 11.5–15.5)
WBC: 6.5 K/uL (ref 4.0–10.5)
nRBC: 0 % (ref 0.0–0.2)

## 2024-12-10 MED ORDER — ACETAMINOPHEN 325 MG PO TABS
ORAL_TABLET | ORAL | Status: AC
  Filled 2024-12-10: qty 3

## 2024-12-10 MED ORDER — TRAMADOL HCL 50 MG PO TABS
50.0000 mg | ORAL_TABLET | Freq: Four times a day (QID) | ORAL | 0 refills | Status: AC | PRN
  Filled 2024-12-10: qty 20, 5d supply, fill #0

## 2024-12-10 MED ORDER — ACETAMINOPHEN 325 MG PO TABS
975.0000 mg | ORAL_TABLET | Freq: Once | ORAL | Status: AC
  Administered 2024-12-10: 975 mg via ORAL

## 2024-12-10 MED ORDER — CEFADROXIL 500 MG PO CAPS
500.0000 mg | ORAL_CAPSULE | Freq: Two times a day (BID) | ORAL | 0 refills | Status: AC
  Filled 2024-12-10: qty 14, 7d supply, fill #0

## 2024-12-10 NOTE — ED Triage Notes (Signed)
 Pt presents with her daughter who is interpreting: Pt states she has left side lower back pain, left side groin pain, and urine odor x 1 week. Pt has taken ibuprofen  for the pain.

## 2024-12-10 NOTE — ED Provider Notes (Signed)
 " [facility]    CSN: [id number] Arrival date & time: 12/10/24  1235      History   Chief Complaint Chief Complaint  Patient presents with   Back Pain   Groin Pain    HPI Brandi Bryant is a 69 y.o. female.   She presents with urinary symptoms and left-sided abdominal and back pain. Deferred interpreter. Primarily translated via help of patient's daughter.  Urinary symptoms - Recent change in urine odor - Increased urinary frequency - No hematuria - No constant urgency  Abdominal and flank pain - Left lower back and abdominal pain for the past three days - Pain radiates from back to front - Current pain is similar to a previous episode a few months ago that required hospitalization for a similar infection with fever  Associated symptoms - Nausea - Headache - Chills with shivering - No vomiting - No diarrhea - No documented fever - Denies history of renal stones  Was hospitalized in August with similar presentation. Treated for cystitis and AKI.  The history is provided by the patient and a relative.    Past Medical History:  Diagnosis Date   Abdominal pain    Allergy    Arthralgia 08/13/2013   Bell palsy 2006   Bell's palsy    Breast cancer (HCC) 2009   Left Breast Cancer   Cataract    Chest pain 09/30/2013   Cholelithiasis    Chronic sinusitis 05/16/2017   Colon cancer screening 10/27/2015   Complication of anesthesia     tubal ligation, in [place]  had weakness, couldnt stand up the next day, was given pills for oxygen for Brain.  1 month after I was having loss of attentiviness, still have to focus  carefully.    Coronary artery disease    CVA (cerebral vascular accident) (HCC) 03/30/2018   Depression    Diabetes mellitus    Type II   Dyspnea    at times- when air conditioner is runnimg, heat also    Encounter for screening colonoscopy 10/30/2015   GERD (gastroesophageal reflux disease)    Hepatic steatosis    History of  breast cancer 06/01/2012   HTN (hypertension)    Hx of adenomatous polyp of colon 11/07/2019   Hyperlipidemia    Internal carotid artery stenosis 04/03/2018   Personal history of chemotherapy 2009   Left Breast Cancer   Personal history of radiation therapy 2009   Left Breast Cancer   Stroke (HCC)    2006, 2015   TIA (transient ischemic attack) 02/20/2014    Patient Active Problem List   Diagnosis Date Noted   E coli bacteremia 07/20/2024   Hypokalemia 07/20/2024   Urinary tract infection 07/18/2024   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   ASCUS of cervix with negative high risk HPV 01/28/2023   Genital warts 01/28/2023   AKI (acute kidney injury) 07/16/2021   Pyelonephritis 07/15/2021   Epigastric pain 06/08/2021   Vitamin D  deficiency 05/13/2021   Insomnia 05/13/2021   Other cirrhosis of liver (HCC) 11/14/2019   Kidney lesion, native, left 11/14/2019   Hx of adenomatous polyp of colon 11/07/2019   Left wrist tendinitis 04/17/2019   Primary osteoarthritis of first carpometacarpal joint of left hand 04/17/2019   Carpal tunnel syndrome 11/07/2018   GERD (gastroesophageal reflux disease) 11/27/2017   Diabetic polyneuropathy associated with type 2 diabetes mellitus (HCC) 10/31/2016   Impingement syndrome of right ankle 10/31/2016   Symptomatic cholelithiasis 12/09/2015  Daily headache 02/21/2014   Trigeminal neuralgia 01/22/2014   Weakness 01/22/2014   Bell's palsy 01/21/2014   UTI (urinary tract infection) 01/21/2014   NAFLD (nonalcoholic fatty liver disease) 96/96/7984   Facial droop 01/15/2014   History of CVA (cerebrovascular accident) 01/15/2014   Type 2 diabetes mellitus with diabetic neuropathy (HCC) 12/06/2013   Hyperlipidemia 11/07/2013   CAD (coronary artery disease), native coronary artery 11/07/2013   Chest pain 09/30/2013   History of breast cancer 06/01/2012   HTN (hypertension)    Diabetes mellitus (HCC)     Past Surgical History:   Procedure Laterality Date   ANKLE ARTHROSCOPY Right 12/14/2016   Procedure: Right Ankle Arthroscopic Debridement;  Surgeon: Jerona Harden GAILS, MD;  Location: [facility] OR;  Service: Orthopedics;  Laterality: Right;   Arm surgery     Left tendon lengthen   BREAST LUMPECTOMY Left 2009   CHOLECYSTECTOMY N/A 12/09/2015   Procedure: LAPAROSCOPIC CHOLECYSTECTOMY WITH INTRAOPERATIVE CHOLANGIOGRAM;  Surgeon: Camellia Blush, MD;  Location: [facility] OR;  Service: General;  Laterality: N/A;   LEFT HEART CATHETERIZATION WITH CORONARY ANGIOGRAM N/A 10/30/2013   Procedure: LEFT HEART CATHETERIZATION WITH CORONARY ANGIOGRAM;  Surgeon: Maude JAYSON Emmer, MD;  Location: [facility] CATH LAB;  Service: Cardiovascular;  Laterality: N/A;   porta cath Right    Removal of Porta cath Right    TUBAL LIGATION      OB History     Gravida  6   Para  6   Term  6   Preterm      AB      Living  6      SAB      IAB      Ectopic      Multiple      Live Births               Home Medications    Prior to Admission medications  Medication Sig Start Date End Date Taking? Authorizing Provider  cefadroxil  (DURICEF) 500 MG capsule Take 1 capsule (500 mg total) by mouth 2 (two) times daily for 7 days. 12/10/24 12/17/24 Yes Alba Sharper, MD  traMADol  (ULTRAM ) 50 MG tablet Take 1 tablet (50 mg total) by mouth every 6 (six) hours as needed for up to 5 days. 12/10/24 12/15/24 Yes Alba Sharper, MD  aspirin  325 MG EC tablet Take 1 tablet (325 mg total) by mouth daily. 06/14/18   Danton Jon HERO, PA-C  diltiazem  (CARDIZEM  CD) 180 MG 24 hr capsule Take 1 capsule (180 mg total) by mouth daily. 06/13/24   Newlin, Enobong, MD  Dulaglutide  (TRULICITY ) 3 MG/0.5ML SOAJ Inject 3 mg as directed once a week. 06/13/24   Newlin, Enobong, MD  escitalopram  (LEXAPRO ) 10 MG tablet Take 1 tablet (10 mg total) by mouth daily. 06/13/24   Newlin, Enobong, MD  HYDROcodone -acetaminophen  (NORCO/VICODIN) 5-325 MG tablet Take 1 tablet by mouth every 6 (six)  hours as needed for moderate pain (pain score 4-6). Patient not taking: Reported on 09/16/2024 07/22/24   Darci Pore, MD  lisinopril -hydrochlorothiazide  (ZESTORETIC ) 20-25 MG tablet Take 1 tablet by mouth daily. 06/13/24   Newlin, Enobong, MD  nitrofurantoin , macrocrystal-monohydrate, (MACROBID ) 100 MG capsule Take 1 capsule (100 mg total) by mouth 2 (two) times daily. 09/16/24   Newlin, Enobong, MD  rosuvastatin  (CRESTOR ) 10 MG tablet Take 1 tablet (10 mg total) by mouth daily. 06/13/24   Newlin, Enobong, MD  traZODone  (DESYREL ) 100 MG tablet Take 1 tablet (100 mg total) by mouth at bedtime as needed  for sleep. 06/13/24   Delbert Clam, MD    Family History Family History  Problem Relation Age of Onset   Thyroid cancer Sister    Hypertension Mother    Diabetes Mother    Migraines Daughter    Colon cancer Neg Hx    Esophageal cancer Neg Hx    Stomach cancer Neg Hx    Rectal cancer Neg Hx     Social History Social History[1]   Allergies   Gadolinium derivatives and Iodinated contrast media   Review of Systems Review of Systems   Physical Exam Triage Vital Signs ED Triage Vitals  Encounter Vitals Group     BP 12/10/24 1401 (!) 146/68     Girls Systolic BP Percentile --      Girls Diastolic BP Percentile --      Boys Systolic BP Percentile --      Boys Diastolic BP Percentile --      Pulse Rate 12/10/24 1401 68     Resp 12/10/24 1401 17     Temp 12/10/24 1401 97.7 F (36.5 C)     Temp Source 12/10/24 1401 Oral     SpO2 12/10/24 1401 97 %     Weight 12/10/24 1358 151 lb (68.5 kg)     Height --      Head Circumference --      Peak Flow --      Pain Score 12/10/24 1359 9     Pain Loc --      Pain Education --      Exclude from Growth Chart --    No data found.  Updated Vital Signs BP 123/73 (BP Location: Left Arm)   Pulse 70   Temp 97.7 F (36.5 C) (Oral)   Resp 17   Wt 68.5 kg   SpO2 97%   BMI 29.49 kg/m   Visual Acuity Right Eye Distance:    Left Eye Distance:   Bilateral Distance:    Right Eye Near:   Left Eye Near:    Bilateral Near:     Physical Exam Vitals and nursing note reviewed.  Constitutional:      Appearance: She is well-developed.     Comments: Hunched, holding back  HENT:     Head: Normocephalic and atraumatic.  Eyes:     Conjunctiva/sclera: Conjunctivae normal.  Cardiovascular:     Rate and Rhythm: Normal rate and regular rhythm.     Heart sounds: No murmur heard. Pulmonary:     Effort: Pulmonary effort is normal. No respiratory distress.     Breath sounds: Normal breath sounds.  Abdominal:     Palpations: Abdomen is soft.     Tenderness: There is no abdominal tenderness. There is no right CVA tenderness, left CVA tenderness, guarding or rebound.     Comments: Patient is not tender around the flank, primarily located around SI joint radiating to the bladder  Musculoskeletal:        General: No swelling.     Cervical back: Neck supple.  Skin:    General: Skin is warm and dry.     Capillary Refill: Capillary refill takes less than 2 seconds.  Neurological:     Mental Status: She is alert.  Psychiatric:        Mood and Affect: Mood normal.      UC Treatments / Results  Labs (all labs ordered are listed, but only abnormal results are displayed) Labs Reviewed  POCT URINE DIPSTICK - Abnormal; Notable for  the following components:      Result Value   Color, UA other (*)    Clarity, UA cloudy (*)    Nitrite, UA Positive (*)    Leukocytes, UA Trace (*)    All other components within normal limits  URINE CULTURE  BASIC METABOLIC PANEL WITH GFR  CBC    EKG   Radiology No results found.  Procedures Procedures (including critical care time)  Medications Ordered in UC Medications  acetaminophen  (TYLENOL ) tablet 975 mg (975 mg Oral Given 12/10/24 1507)    Initial Impression / Assessment and Plan / UC Course  I have reviewed the triage vital signs and the nursing notes.  Pertinent  labs & imaging results that were available during my care of the patient were reviewed by me and considered in my medical decision making (see chart for details).  Clinical Course as of 12/10/24 1513  Tue Dec 10, 2024  1511 Urine Culture [MQ]    Clinical Course User Index [MQ] Alba Sharper, MD    Patient presenting with urinary frequency and flank pain, with urine results consistent with acute urinary tract infection.  Reassuringly she has stable vital signs and is afebrile.  Of note she has previously hospitalized for AKI and acute cystitis after similar presentation in August 2025.  I discussed risks and benefits of going to the emergency room for rule out pyelonephritis/renal stone with CT scan.  Patient elected to manage with outpatient care.  This is reasonable as her vital signs are stable, she feels her pain is manageable, and there is not clear clinical signs of pyelonephritis without fever or CVA tenderness.  Nephrolithiasis seems less likely without history, no blood on UA.  Would be inconsistent with other acute abdominal pathology such as diverticulitis, appendicitis.  BMP and CBC ordered, discussed with patient that if there is concern on the labs, would recommend she go to the emergency department regardless.  Urine culture ordered.  7-day course of Duricef and 5-day course of as needed tramadol  sent to patient's pharmacy.  Final Clinical Impressions(s) / UC Diagnoses   Final diagnoses:  Acute cystitis without hematuria     Discharge Instructions      You have an infection in your bladder (UTI). This can cause stomach and back pain. I have sent an antibiotic to your pharmacy called Duricef.  You will take this twice daily for 7 days. I have sent a pain medicine called tramadol  to your pharmacy. You can take this up to 2 times daily. If you have abnormal labs on the labs we drew today, we will call you. If you develop any fevers or the pain is not controlled or not  improving with the medicine, please go to the emergency room.     ED Prescriptions     Medication Sig Dispense Auth. Provider   cefadroxil  (DURICEF) 500 MG capsule Take 1 capsule (500 mg total) by mouth 2 (two) times daily for 7 days. 14 capsule Rafay Dahan, MD   traMADol  (ULTRAM ) 50 MG tablet Take 1 tablet (50 mg total) by mouth every 6 (six) hours as needed for up to 5 days. 20 tablet Shylo Zamor, MD      I have reviewed the PDMP during this encounter.     [1]  Social History Tobacco Use   Smoking status: Never   Smokeless tobacco: Never  Vaping Use   Vaping status: Never Used  Substance Use Topics   Alcohol use: No   Drug use: No  Alba Sharper, MD 12/10/24 1514  "

## 2024-12-10 NOTE — Discharge Instructions (Addendum)
 You have an infection in your bladder (UTI). This can cause stomach and back pain. I have sent an antibiotic to your pharmacy called Duricef.  You will take this twice daily for 7 days. I have sent a pain medicine called tramadol  to your pharmacy. You can take this up to 2 times daily. If you have abnormal labs on the labs we drew today, we will call you. If you develop any fevers or the pain is not controlled or not improving with the medicine, please go to the emergency room.

## 2024-12-11 ENCOUNTER — Ambulatory Visit (HOSPITAL_COMMUNITY): Payer: Self-pay

## 2024-12-12 LAB — URINE CULTURE: Culture: >=100000 — AB

## 2024-12-16 ENCOUNTER — Telehealth: Payer: Self-pay | Admitting: Family Medicine

## 2024-12-16 NOTE — Telephone Encounter (Signed)
 Due to inclement weather, our office will be closed tomorrow 12/17/2024 . Well reach out to reschedule your appointment. Thank you.Please call [phone] ( if the pt calls back please resch appt )

## 2024-12-17 ENCOUNTER — Ambulatory Visit: Payer: Self-pay | Admitting: Family Medicine

## 2024-12-18 ENCOUNTER — Other Ambulatory Visit: Payer: Self-pay

## 2024-12-18 ENCOUNTER — Encounter: Payer: Self-pay | Admitting: Family Medicine

## 2024-12-18 ENCOUNTER — Ambulatory Visit: Payer: Self-pay | Attending: Family Medicine | Admitting: Family Medicine

## 2024-12-18 VITALS — BP 127/70 | HR 69 | Temp 98.2°F | Ht 60.0 in | Wt 152.2 lb

## 2024-12-18 DIAGNOSIS — N3000 Acute cystitis without hematuria: Secondary | ICD-10-CM

## 2024-12-18 DIAGNOSIS — F32A Depression, unspecified: Secondary | ICD-10-CM

## 2024-12-18 DIAGNOSIS — I10 Essential (primary) hypertension: Secondary | ICD-10-CM

## 2024-12-18 DIAGNOSIS — E1140 Type 2 diabetes mellitus with diabetic neuropathy, unspecified: Secondary | ICD-10-CM

## 2024-12-18 DIAGNOSIS — G4700 Insomnia, unspecified: Secondary | ICD-10-CM

## 2024-12-18 DIAGNOSIS — Z794 Long term (current) use of insulin: Secondary | ICD-10-CM

## 2024-12-18 DIAGNOSIS — M25552 Pain in left hip: Secondary | ICD-10-CM

## 2024-12-18 DIAGNOSIS — Z7985 Long-term (current) use of injectable non-insulin antidiabetic drugs: Secondary | ICD-10-CM

## 2024-12-18 DIAGNOSIS — J349 Unspecified disorder of nose and nasal sinuses: Secondary | ICD-10-CM

## 2024-12-18 LAB — POCT GLYCOSYLATED HEMOGLOBIN (HGB A1C): HbA1c, POC (controlled diabetic range): 6.2 % (ref 0.0–7.0)

## 2024-12-18 LAB — POCT URINALYSIS DIP (CLINITEK)
Bilirubin, UA: NEGATIVE
Blood, UA: NEGATIVE
Glucose, UA: NEGATIVE mg/dL
Ketones, POC UA: NEGATIVE mg/dL
Leukocytes, UA: NEGATIVE
Nitrite, UA: NEGATIVE
POC PROTEIN,UA: NEGATIVE
Spec Grav, UA: 1.01
Urobilinogen, UA: 1 U/dL
pH, UA: 5.5

## 2024-12-18 MED ORDER — TRULICITY 3 MG/0.5ML ~~LOC~~ SOAJ
3.0000 mg | SUBCUTANEOUS | 1 refills | Status: AC
  Filled 2024-12-18: qty 2, 28d supply, fill #0

## 2024-12-18 MED ORDER — LISINOPRIL-HYDROCHLOROTHIAZIDE 20-25 MG PO TABS
1.0000 | ORAL_TABLET | Freq: Every day | ORAL | 1 refills | Status: AC
  Filled 2024-12-18: qty 90, 90d supply, fill #0

## 2024-12-18 MED ORDER — TRAZODONE HCL 100 MG PO TABS
100.0000 mg | ORAL_TABLET | Freq: Every evening | ORAL | 1 refills | Status: AC | PRN
  Filled 2024-12-18: qty 90, 90d supply, fill #0

## 2024-12-18 MED ORDER — FLUTICASONE PROPIONATE 50 MCG/ACT NA SUSP
2.0000 | Freq: Every day | NASAL | 1 refills | Status: AC
  Filled 2024-12-18: qty 16, 30d supply, fill #0

## 2024-12-18 MED ORDER — ESCITALOPRAM OXALATE 10 MG PO TABS
10.0000 mg | ORAL_TABLET | Freq: Every day | ORAL | 1 refills | Status: AC
  Filled 2024-12-18: qty 90, 90d supply, fill #0

## 2024-12-18 MED ORDER — ROSUVASTATIN CALCIUM 10 MG PO TABS
10.0000 mg | ORAL_TABLET | Freq: Every day | ORAL | 1 refills | Status: AC
  Filled 2024-12-18: qty 90, 90d supply, fill #0

## 2024-12-18 NOTE — Patient Instructions (Signed)
 VISIT SUMMARY:  Today, you came in for a follow-up visit to discuss your diabetes, urinary symptoms, hip pain, nasal discomfort, and other health concerns. We reviewed your recent A1c results, which show that your diabetes is well controlled. We also discussed your recent urinary tract infection, hip pain, nasal issues, and sleep disturbances.  YOUR PLAN:  -ACUTE CYSTITIS: Acute cystitis is a urinary tract infection. We have ordered a urine test and culture to determine the best antibiotic treatment. Please wait for the culture results before starting any new antibiotics.  -TYPE 2 DIABETES MELLITUS WITH DIABETIC NEUROPATHY: Your diabetes is well controlled with an A1c of 6.2. Continue with your current diabetes management regimen.  -PRIMARY HYPERTENSION: Your blood pressure is well controlled. Continue with your current antihypertensive medication.  -LEFT HIP PAIN: You have been experiencing hip pain, which may be due to arthritis. We have ordered an x-ray of your left hip to investigate further.  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 We have prescribed Flonase  nasal spray and sent a nasal swab for MRSA testing.  -DEPRESSION: You have not been taking Lexapro  for depression due to lack of refills. We have refilled your prescription for Lexapro  if needed.  -INSOMNIA: You continue to experience sleep disturbances. Continue taking Trazodone  as needed for sleep.  -GENERAL HEALTH MAINTENANCE: We need to check your cholesterol levels and you missed your mammogram last year. We have scheduled a fasting cholesterol test and provided a scholarship form for your mammogram.  INSTRUCTIONS:  Please follow up with the urine test and culture results before starting any new antibiotics for your urinary tract infection. Continue with your current medications for diabetes, hypertension, and sleep as discussed. Get the x-ray of your left hip done as ordered. Use the Flonase   nasal spray as prescribed and await the MRSA test results. Refill and take Lexapro  if needed for depression. Complete the fasting cholesterol test and schedule your mammogram using the provided scholarship form.

## 2024-12-18 NOTE — Progress Notes (Signed)
 "  Subjective:  Patient ID: Brandi Bryant, female    DOB: 1956/08/23  Age: 69 y.o. MRN: [id number]  CC: Medical Management of Chronic Issues (Discuss lab result from the urgent care. /Left hip pain X 1 week./Nose discomfort X 1 Month. )     Discussed the use of AI scribe software for clinical note transcription with the patient, who gave verbal consent to proceed.  History of Present Illness Brandi Bryant is a 69 year old female with  a history of GERD, hypertension, hyperlipidemia, breast cancer status post left lumpectomy and radiation, type 2 diabetes mellitus, previous CVA, TIA in 03/2018, VIN 2  who presents for follow-up and evaluation of urinary symptoms and hip pain. She is accompanied by her daughter.  Her most recent A1c is 6.2 and she endorses adherence to her diabetic regimen. She takes a statin, but levels have not been checked recently.  She was recently treated in the emergency room for a urinary tract infection with a course of Macrobid . Culture showed intermediate sensitivity to Macrobid . She still has dark urine and occasional dysuria, without flank pain, abdominal pain or fever.  She has had left hip pain for 2 weeks. It is nonradiating and worse when getting up, laying on that side, or starting to walk.  She has nasal itching and discomfort with occasional epistaxis from poking her nose. She feels something moving in her nose and has previously been tested for MRSA during hospital stays.  Daughter would like to have a MRSA screen.  Her current medications include Trazodone  for sleep. She has not been taking Lexapro  for depression due to lack of refills.    Past Medical History:  Diagnosis Date   Abdominal pain    Allergy    Arthralgia 08/13/2013   Bell palsy 2006   Bell's palsy    Breast cancer (HCC) 2009   Left Breast Cancer   Cataract    Chest pain 09/30/2013   Cholelithiasis    Chronic sinusitis 05/16/2017   Colon cancer screening 10/27/2015    Complication of anesthesia     tubal ligation, in [place]  had weakness, couldnt stand up the next day, was given pills for oxygen for Brain.  1 month after I was having loss of attentiviness, still have to focus  carefully.    Coronary artery disease    CVA (cerebral vascular accident) (HCC) 03/30/2018   Depression    Diabetes mellitus    Type II   Dyspnea    at times- when air conditioner is runnimg, heat also    Encounter for screening colonoscopy 10/30/2015   GERD (gastroesophageal reflux disease)    Hepatic steatosis    History of breast cancer 06/01/2012   HTN (hypertension)    Hx of adenomatous polyp of colon 11/07/2019   Hyperlipidemia    Internal carotid artery stenosis 04/03/2018   Personal history of chemotherapy 2009   Left Breast Cancer   Personal history of radiation therapy 2009   Left Breast Cancer   Stroke [facility])    2006, 2015   TIA (transient ischemic attack) 02/20/2014    Past Surgical History:  Procedure Laterality Date   ANKLE ARTHROSCOPY Right 12/14/2016   Procedure: Right Ankle Arthroscopic Debridement;  Surgeon: Jerona Harden GAILS, MD;  Location: [facility] OR;  Service: Orthopedics;  Laterality: Right;   Arm surgery     Left tendon lengthen   BREAST LUMPECTOMY Left 2009   CHOLECYSTECTOMY N/A 12/09/2015   Procedure: LAPAROSCOPIC CHOLECYSTECTOMY WITH INTRAOPERATIVE CHOLANGIOGRAM;  Surgeon: Camellia Blush, MD;  Location: [facility] OR;  Service: General;  Laterality: N/A;   LEFT HEART CATHETERIZATION WITH CORONARY ANGIOGRAM N/A 10/30/2013   Procedure: LEFT HEART CATHETERIZATION WITH CORONARY ANGIOGRAM;  Surgeon: Maude JAYSON Emmer, MD;  Location: [facility] CATH LAB;  Service: Cardiovascular;  Laterality: N/A;   porta cath Right    Removal of Porta cath Right    TUBAL LIGATION      Family History  Problem Relation Age of Onset   Thyroid cancer Sister    Hypertension Mother    Diabetes Mother    Migraines Daughter    Colon cancer Neg Hx    Esophageal cancer Neg Hx    Stomach cancer  Neg Hx    Rectal cancer Neg Hx     Social History   Socioeconomic History   Marital status: Single    Spouse name: Not on file   Number of children: 6   Years of education: Not on file   Highest education level: High school graduate  Occupational History   Not on file  Tobacco Use   Smoking status: Never   Smokeless tobacco: Never  Vaping Use   Vaping status: Never Used  Substance and Sexual Activity   Alcohol use: No   Drug use: No   Sexual activity: Not Currently    Birth control/protection: Surgical  Other Topics Concern   Not on file  Social History Narrative   Single with 6 children    lives with her daughter   Right handed   Never smoker no drug use no alcohol   Social Drivers of Health   Tobacco Use: Low Risk (12/18/2024)   Patient History    Smoking Tobacco Use: Never    Smokeless Tobacco Use: Never    Passive Exposure: Not on file  Financial Resource Strain: Low Risk (10/16/2023)   Overall Financial Resource Strain (CARDIA)    Difficulty of Paying Living Expenses: Not hard at all  Food Insecurity: No Food Insecurity (07/17/2024)   Epic    Worried About Radiation Protection Practitioner of Food in the Last Year: Never true    Ran Out of Food in the Last Year: Never true  Transportation Needs: No Transportation Needs (07/17/2024)   Epic    Lack of Transportation (Medical): No    Lack of Transportation (Non-Medical): No  Physical Activity: Insufficiently Active (10/16/2023)   Exercise Vital Sign    Days of Exercise per Week: 3 days    Minutes of Exercise per Session: 20 min  Stress: No Stress Concern Present (10/16/2023)   [employer] of Occupational Health - Occupational Stress Questionnaire    Feeling of Stress : Only a little  Social Connections: Socially Isolated (07/17/2024)   Social Connection and Isolation Panel    Frequency of Communication with Friends and Family: Twice a week    Frequency of Social Gatherings with Friends and Family: Twice a week    Attends  Religious Services: Never    Database Administrator or Organizations: No    Attends Banker Meetings: Never    Marital Status: Separated  Depression (PHQ2-9): Medium Risk (12/18/2024)   Depression (PHQ2-9)    PHQ-2 Score: 5  Alcohol Screen: Low Risk (10/16/2023)   Alcohol Screen    Last Alcohol Screening Score (AUDIT): 0  Housing: Low Risk (07/17/2024)   Epic    Unable to Pay for Housing in the Last Year: No    Number of Times Moved in the Last Year: 0  Homeless in the Last Year: No  Utilities: Not At Risk (07/17/2024)   Epic    Threatened with loss of utilities: No  Health Literacy: Adequate Health Literacy (10/16/2023)   B1300 Health Literacy    Frequency of need for help with medical instructions: Rarely    Allergies[1]  Outpatient Medications Prior to Visit  Medication Sig Dispense Refill   aspirin  325 MG EC tablet Take 1 tablet (325 mg total) by mouth daily. 30 tablet 6   diltiazem  (CARDIZEM  CD) 180 MG 24 hr capsule Take 1 capsule (180 mg total) by mouth daily. 90 capsule 3   Dulaglutide  (TRULICITY ) 3 MG/0.5ML SOAJ Inject 3 mg as directed once a week. 6 mL 1   lisinopril -hydrochlorothiazide  (ZESTORETIC ) 20-25 MG tablet Take 1 tablet by mouth daily. 90 tablet 3   traZODone  (DESYREL ) 100 MG tablet Take 1 tablet (100 mg total) by mouth at bedtime as needed for sleep. 90 tablet 3   HYDROcodone -acetaminophen  (NORCO/VICODIN) 5-325 MG tablet Take 1 tablet by mouth every 6 (six) hours as needed for moderate pain (pain score 4-6). (Patient not taking: Reported on 12/18/2024) 14 tablet 0   nitrofurantoin , macrocrystal-monohydrate, (MACROBID ) 100 MG capsule Take 1 capsule (100 mg total) by mouth 2 (two) times daily. (Patient not taking: Reported on 12/18/2024) 10 capsule 0   escitalopram  (LEXAPRO ) 10 MG tablet Take 1 tablet (10 mg total) by mouth daily. (Patient not taking: Reported on 12/18/2024) 90 tablet 3   rosuvastatin  (CRESTOR ) 10 MG tablet Take 1 tablet (10 mg total) by  mouth daily. (Patient not taking: Reported on 12/18/2024) 90 tablet 3   No facility-administered medications prior to visit.     ROS Review of Systems  Constitutional:  Negative for activity change and appetite change.  HENT:  Positive for nosebleeds. Negative for postnasal drip, sinus pressure and sore throat.   Respiratory:  Negative for chest tightness, shortness of breath and wheezing.   Cardiovascular:  Negative for chest pain and palpitations.  Gastrointestinal:  Negative for abdominal distention, abdominal pain and constipation.  Genitourinary:        See HPI  Musculoskeletal:        See HPI  Psychiatric/Behavioral:  Negative for behavioral problems and dysphoric mood.     Objective:  BP 127/70   Pulse 69   Temp 98.2 F (36.8 C) (Oral)   Ht 5' (1.524 m)   Wt 152 lb 3.2 oz (69 kg)   SpO2 98%   BMI 29.72 kg/m      12/18/2024    3:08 PM 12/10/2024    3:11 PM 12/10/2024    2:01 PM  BP/Weight  Systolic BP 127 123 146  Diastolic BP 70 73 68  Wt. (Lbs) 152.2    BMI 29.72 kg/m2        Physical Exam Constitutional:      Appearance: She is well-developed.  HENT:     Nose: Nose normal.  Cardiovascular:     Rate and Rhythm: Normal rate.     Heart sounds: Normal heart sounds. No murmur heard. Pulmonary:     Effort: Pulmonary effort is normal.     Breath sounds: Normal breath sounds. No wheezing or rales.  Chest:     Chest wall: No tenderness.  Abdominal:     General: Bowel sounds are normal. There is no distension.     Palpations: Abdomen is soft. There is no mass.     Tenderness: There is no abdominal tenderness. There is left CVA tenderness. There  is no right CVA tenderness.  Musculoskeletal:        General: Normal range of motion.     Right lower leg: No edema.     Left lower leg: No edema.     Comments: No tenderness on palpation or range of motion of left hip  Neurological:     Mental Status: She is alert and oriented to person, place, and time.   Psychiatric:        Mood and Affect: Mood normal.        Latest Ref Rng & Units 12/10/2024    2:45 PM 09/16/2024   10:15 AM 07/22/2024    3:29 AM  CMP  Glucose 70 - 99 mg/dL 97  726  833   BUN 8 - 23 mg/dL 21  12  8    Creatinine 0.44 - 1.00 mg/dL 9.10  9.13  9.22   Sodium 135 - 145 mmol/L 137  137  138   Potassium 3.5 - 5.1 mmol/L 4.4  4.3  3.5   Chloride 98 - 111 mmol/L 103  101  104   CO2 22 - 32 mmol/L 21  19  24    Calcium  8.9 - 10.3 mg/dL 9.5  9.8  8.0   Total Protein 6.0 - 8.5 g/dL  7.2  5.9   Total Bilirubin 0.0 - 1.2 mg/dL  0.9  1.0   Alkaline Phos 49 - 135 IU/L  163  193   AST 0 - 40 IU/L  58  77   ALT 0 - 32 IU/L  34  45     Lipid Panel     Component Value Date/Time   CHOL 126 02/03/2022 1059   TRIG 92 02/03/2022 1059   HDL 57 02/03/2022 1059   CHOLHDL 2.2 02/03/2022 1059   CHOLHDL 2.6 03/31/2018 0348   VLDL 33 03/31/2018 0348   LDLCALC 52 02/03/2022 1059    CBC    Component Value Date/Time   WBC 6.5 12/10/2024 1458   RBC 4.72 12/10/2024 1458   HGB 14.7 12/10/2024 1458   HGB 13.7 09/16/2024 1015   HGB 13.4 09/30/2013 1033   HCT 41.5 12/10/2024 1458   HCT 41.9 09/16/2024 1015   HCT 39.1 09/30/2013 1033   PLT 141 (L) 12/10/2024 1458   PLT 158 09/16/2024 1015   MCV 87.9 12/10/2024 1458   MCV 96 09/16/2024 1015   MCV 88.5 09/30/2013 1033   MCH 31.1 12/10/2024 1458   MCHC 35.4 12/10/2024 1458   RDW 13.2 12/10/2024 1458   RDW 12.4 09/16/2024 1015   RDW 12.8 09/30/2013 1033   LYMPHSABS 1.7 09/16/2024 1015   LYMPHSABS 2.1 09/30/2013 1033   MONOABS 0.7 03/06/2024 2026   MONOABS 0.4 09/30/2013 1033   EOSABS 0.2 09/16/2024 1015   BASOSABS 0.0 09/16/2024 1015   BASOSABS 0.0 09/30/2013 1033    Lab Results  Component Value Date   HGBA1C 6.2 12/18/2024    Lab Results  Component Value Date   HGBA1C 6.2 12/18/2024   HGBA1C 5.9 09/16/2024   HGBA1C 7.9 (A) 06/13/2024       Assessment & Plan Acute cystitis -UA today is negative for UTI -  Ordered urine test and culture. - Await culture results before prescribing new antibiotics if indicated - Increase oral hydration  Type 2 diabetes mellitus with diabetic neuropathy Diabetes well controlled with A1c of 6.2. - Continue current diabetes management regimen. -Counseled on Diabetic diet, the healthy plate, 849 minutes of moderate intensity exercise/week Blood sugar logs  with fasting goals of 80-120 mg/dl, random of less than 819 and in the event of sugars less than 60 mg/dl or greater than 599 mg/dl encouraged to notify the clinic. Advised on the need for annual eye exams, annual foot exams, Pneumonia vaccine.   Primary hypertension Blood pressure well controlled. - Continue current antihypertensive regimen. -Counseled on blood pressure goal of less than 130/80, low-sodium, DASH diet, medication compliance, 150 minutes of moderate intensity exercise per week. Discussed medication compliance, adverse effects.   Left hip pain Pain exacerbated by movement and weight-bearing. Differential includes arthritis. - Ordered x-ray of the left hip.  Nose disorder Nasal discomfort with associated nasal picking - Prescribed Flonase  nasal spray. - Sent nasal swab for MRSA testing per daughter's request  Depression Not currently taking Lexapro . Three refills available. - Refilled Lexapro  if needed.  Insomnia Continues to experience sleep disturbances. - Continue Trazodone  as needed for sleep.  General Health Maintenance Cholesterol levels need checking. Mammogram not completed last year. - Scheduled fasting cholesterol test. - Provided scholarship form for mammogram which was previously ordered      Meds ordered this encounter  Medications   Dulaglutide  (TRULICITY ) 3 MG/0.5ML SOAJ    Sig: Inject 3 mg as directed once a week.    Dispense:  6 mL    Refill:  1    Dose increase   lisinopril -hydrochlorothiazide  (ZESTORETIC ) 20-25 MG tablet    Sig: Take 1 tablet by mouth  daily.    Dispense:  90 tablet    Refill:  1   rosuvastatin  (CRESTOR ) 10 MG tablet    Sig: Take 1 tablet (10 mg total) by mouth daily.    Dispense:  90 tablet    Refill:  1   escitalopram  (LEXAPRO ) 10 MG tablet    Sig: Take 1 tablet (10 mg total) by mouth daily.    Dispense:  90 tablet    Refill:  1   traZODone  (DESYREL ) 100 MG tablet    Sig: Take 1 tablet (100 mg total) by mouth at bedtime as needed for sleep.    Dispense:  90 tablet    Refill:  1   fluticasone  (FLONASE ) 50 MCG/ACT nasal spray    Sig: Place 2 sprays into both nostrils daily.    Dispense:  16 g    Refill:  1    Follow-up: Return in about 6 months (around 06/17/2025) for Chronic medical conditions.       Corrina Sabin, MD, FAAFP. [facility] [place], [place] [phone]   12/18/2024, 5:21 PM     [1]  Allergies Allergen Reactions   Gadolinium Derivatives Nausea And Vomiting    Code: VOM, Desc: Pt began vomiting 45 sec after MRI contrast injection of Multihance , Onset Date: [id number]     Iodinated Contrast Media Nausea And Vomiting   "

## 2024-12-19 ENCOUNTER — Other Ambulatory Visit: Payer: Self-pay | Admitting: Family Medicine

## 2024-12-19 ENCOUNTER — Other Ambulatory Visit (HOSPITAL_COMMUNITY): Payer: Self-pay | Admitting: Family Medicine

## 2024-12-19 ENCOUNTER — Other Ambulatory Visit: Payer: Self-pay

## 2024-12-19 ENCOUNTER — Ambulatory Visit (HOSPITAL_COMMUNITY)
Admission: RE | Admit: 2024-12-19 | Discharge: 2024-12-19 | Disposition: A | Discharge status: Home / Self Care | Payer: Self-pay | Source: Ambulatory Visit | Attending: Family Medicine | Admitting: Family Medicine

## 2024-12-19 ENCOUNTER — Ambulatory Visit: Payer: Self-pay | Attending: Family Medicine

## 2024-12-19 DIAGNOSIS — G4700 Insomnia, unspecified: Secondary | ICD-10-CM

## 2024-12-19 DIAGNOSIS — M25552 Pain in left hip: Secondary | ICD-10-CM

## 2024-12-19 DIAGNOSIS — F32A Depression, unspecified: Secondary | ICD-10-CM

## 2024-12-19 DIAGNOSIS — I10 Essential (primary) hypertension: Secondary | ICD-10-CM

## 2024-12-19 DIAGNOSIS — N3000 Acute cystitis without hematuria: Secondary | ICD-10-CM

## 2024-12-19 DIAGNOSIS — E1140 Type 2 diabetes mellitus with diabetic neuropathy, unspecified: Secondary | ICD-10-CM

## 2024-12-19 DIAGNOSIS — J349 Unspecified disorder of nose and nasal sinuses: Secondary | ICD-10-CM

## 2024-12-19 DIAGNOSIS — Z7985 Long-term (current) use of injectable non-insulin antidiabetic drugs: Secondary | ICD-10-CM

## 2024-12-19 LAB — MICROALBUMIN / CREATININE URINE RATIO
Creatinine, Urine: 49.3 mg/dL
Microalb/Creat Ratio: 10 mg/g{creat} (ref 0–29)
Microalbumin, Urine: 4.7 ug/mL

## 2024-12-20 ENCOUNTER — Ambulatory Visit: Payer: Self-pay | Admitting: Family Medicine

## 2024-12-20 LAB — LP+NON-HDL CHOLESTEROL
Cholesterol, Total: 144 mg/dL (ref 100–199)
HDL: 44 mg/dL
LDL Chol Calc (NIH): 80 mg/dL (ref 0–99)
Total Non-HDL-Chol (LDL+VLDL): 100 mg/dL (ref 0–129)
Triglycerides: 111 mg/dL (ref 0–149)
VLDL Cholesterol Cal: 20 mg/dL (ref 5–40)

## 2024-12-20 NOTE — Telephone Encounter (Signed)
 UA results are not back yet. Patient will be informed once they are in.   Copied from CRM #[id number]. Topic: General - Other >> Dec 20, 2024  3:40 PM Zebedee SAUNDERS wrote: Reason for CRM: Pt's son calling on behalf of pt, pt returning Lonita Florette GAILS, CMA call for lab results which were provided. Pt wants to know about her urine results. Please call pt at  [phone].

## 2024-12-21 LAB — RESULT

## 2024-12-21 LAB — URINE CULTURE

## 2024-12-21 LAB — MRSA MSSA SCREENING CULTURE

## 2024-12-23 ENCOUNTER — Ambulatory Visit: Payer: Self-pay | Admitting: Family Medicine

## 2024-12-25 ENCOUNTER — Ambulatory Visit: Payer: Self-pay | Admitting: Family Medicine

## 2024-12-26 ENCOUNTER — Other Ambulatory Visit: Payer: Self-pay

## 2025-06-17 ENCOUNTER — Ambulatory Visit: Payer: Self-pay | Admitting: Family Medicine
